# Patient Record
Sex: Male | Born: 1955
Health system: Southern US, Community
[De-identification: ages and names within clinical notes are randomized; demographics above are authoritative.]

## PROBLEM LIST (undated history)

## (undated) DIAGNOSIS — M545 Low back pain, unspecified: Secondary | ICD-10-CM

## (undated) DIAGNOSIS — J309 Allergic rhinitis, unspecified: Secondary | ICD-10-CM

## (undated) DIAGNOSIS — Z860100 Personal history of colon polyps, unspecified: Secondary | ICD-10-CM

## (undated) DIAGNOSIS — J4530 Mild persistent asthma, uncomplicated: Secondary | ICD-10-CM

## (undated) DIAGNOSIS — I10 Essential (primary) hypertension: Secondary | ICD-10-CM

## (undated) DIAGNOSIS — Z8701 Personal history of pneumonia (recurrent): Secondary | ICD-10-CM

## (undated) DIAGNOSIS — Z87442 Personal history of urinary calculi: Secondary | ICD-10-CM

## (undated) DIAGNOSIS — K5909 Other constipation: Secondary | ICD-10-CM

## (undated) DIAGNOSIS — Z9189 Other specified personal risk factors, not elsewhere classified: Secondary | ICD-10-CM

## (undated) DIAGNOSIS — Z973 Presence of spectacles and contact lenses: Secondary | ICD-10-CM

## (undated) DIAGNOSIS — C61 Malignant neoplasm of prostate: Secondary | ICD-10-CM

## (undated) DIAGNOSIS — Z8781 Personal history of (healed) traumatic fracture: Secondary | ICD-10-CM

## (undated) DIAGNOSIS — M8580 Other specified disorders of bone density and structure, unspecified site: Secondary | ICD-10-CM

## (undated) DIAGNOSIS — R0600 Dyspnea, unspecified: Secondary | ICD-10-CM

## (undated) DIAGNOSIS — C679 Malignant neoplasm of bladder, unspecified: Secondary | ICD-10-CM

## (undated) DIAGNOSIS — R200 Anesthesia of skin: Secondary | ICD-10-CM

## (undated) DIAGNOSIS — C7989 Secondary malignant neoplasm of other specified sites: Secondary | ICD-10-CM

## (undated) DIAGNOSIS — N401 Enlarged prostate with lower urinary tract symptoms: Secondary | ICD-10-CM

## (undated) DIAGNOSIS — Z8042 Family history of malignant neoplasm of prostate: Secondary | ICD-10-CM

## (undated) DIAGNOSIS — R0609 Other forms of dyspnea: Secondary | ICD-10-CM

## (undated) DIAGNOSIS — G8929 Other chronic pain: Secondary | ICD-10-CM

## (undated) HISTORY — DX: Personal history of colon polyps, unspecified: Z86.0100

## (undated) HISTORY — DX: Personal history of colonic polyps: Z86.010

## (undated) HISTORY — DX: Personal history of urinary calculi: Z87.442

## (undated) HISTORY — PX: UMBILICAL HERNIA REPAIR: SHX196

## (undated) HISTORY — PX: PROSTATE BIOPSY: SHX241

## (undated) HISTORY — DX: Essential (primary) hypertension: I10

## (undated) HISTORY — PX: COLONOSCOPY: SHX174

## (undated) HISTORY — DX: Family history of malignant neoplasm of prostate: Z80.42

---

## 2001-04-04 ENCOUNTER — Encounter (INDEPENDENT_AMBULATORY_CARE_PROVIDER_SITE_OTHER): Payer: Self-pay | Admitting: Specialist

## 2001-04-04 ENCOUNTER — Ambulatory Visit (HOSPITAL_COMMUNITY): Admission: RE | Admit: 2001-04-04 | Discharge: 2001-04-04 | Payer: Self-pay | Admitting: Gastroenterology

## 2001-09-02 ENCOUNTER — Ambulatory Visit (HOSPITAL_COMMUNITY): Admission: RE | Admit: 2001-09-02 | Discharge: 2001-09-02 | Payer: Self-pay | Admitting: *Deleted

## 2001-09-02 ENCOUNTER — Encounter: Payer: Self-pay | Admitting: *Deleted

## 2003-05-07 ENCOUNTER — Ambulatory Visit (HOSPITAL_COMMUNITY): Admission: RE | Admit: 2003-05-07 | Discharge: 2003-05-07 | Payer: Self-pay | Admitting: Gastroenterology

## 2007-07-28 LAB — HM COLONOSCOPY

## 2009-12-20 ENCOUNTER — Encounter: Payer: Self-pay | Admitting: Internal Medicine

## 2010-05-20 ENCOUNTER — Ambulatory Visit: Payer: Self-pay | Admitting: Internal Medicine

## 2010-05-20 DIAGNOSIS — J4520 Mild intermittent asthma, uncomplicated: Secondary | ICD-10-CM | POA: Insufficient documentation

## 2010-05-20 DIAGNOSIS — I10 Essential (primary) hypertension: Secondary | ICD-10-CM | POA: Insufficient documentation

## 2010-05-20 DIAGNOSIS — J309 Allergic rhinitis, unspecified: Secondary | ICD-10-CM | POA: Insufficient documentation

## 2010-07-27 HISTORY — PX: ROTATOR CUFF REPAIR: SHX139

## 2010-08-26 NOTE — Assessment & Plan Note (Signed)
Summary: Pulmonary consultation/ asthma > try change to Symbicort   Copy to:  Dr. Christell Constant Primary Bronwyn Belasco/Referring Corona Popovich:  Dr. Vernon Prey  CC:  Asthma.  History of Present Illness: 55 yowm quit  smoking 2001 with dx of asthma never totally outgrew.  May 20, 2010  1st pulmonary office eval  maintained on advair cc subjective wheeze sob maybe once a week  ventolin,  things that trigger cold weath, really humid weather, rare flares with viral illness or colds.  Always a  bit sob with ex.   Needing prednsione maybe once a year even if doubles the dose of advair right at onset. Usually requires 10 days of high dose rx to control it.  no noct flares.  Pt denies any significant sore throat, dysphagia, itching, sneezing,  nasal congestion or excess secretions,  fever, chills, sweats, unintended wt loss, pleuritic or exertional cp, hempoptysis, change in activity tolerance  orthopnea pnd or leg swelling Pt also denies any obvious fluctuation in symptoms with weather or environmental change or other alleviating or aggravating factors.       Current Medications (verified): 1)  Diovan Hct 160-12.5 Mg Tabs (Valsartan-Hydrochlorothiazide) .[name].. 1 Once Daily 2)  Norvasc 10 Mg Tabs (Amlodipine Besylate) .[name].. 1 Once Daily 3)  Advair Diskus 100-50 Mcg/dose Aepb (Fluticasone-Salmeterol) .[name].. 1 Puff Two Times A Day 4)  Nasonex 50 Mcg/act Susp (Mometasone Furoate) .[name].. 1 Spray Each Nostril Daily 5)  Astelin 137 Mcg/spray Soln (Azelastine Hcl) .... One Spray Each Nostril Daily 6)  Ventolin Hfa 108 (90 Base) Mcg/act Aers (Albuterol Sulfate) .... As Needed  Allergies (verified): 1)  ! Tetracycline  Past History:  Past Medical History: Allergic Rhinitis Asthma   - HFA 75% p coaching May 20, 2010  Hypertension  Past Surgical History: rotator cuff-Jan 16, 2010 Family History: Father-died age 57 of MI, prostate cancer of MI, prostate cancer Negative for respiratory diseases or atopy   Social History: Pt started  smoking in 1971, quit in 2001, less than 1ppd.  Married Production designer, theatre/television/film  Vital Signs:  Patient profile:   55 year old male Height:      70 inches Weight:      201 pounds BMI:     28.94 O2 Sat:      98 % on Room air Temp:     98.0 degrees F oral Pulse rate:   98 / minute Cuff size:   regular  Vitals Entered By: Carron Curie CMA (May 20, 2010 10:00 AM)  O2 Flow:  Room air CC: Asthma Comments Medications reviewed with patient Carron Curie CMA  May 20, 2010 10:04 AM Daytime phone number verified with patient.    Physical Exam  Additional Exam:  Pleasant amb wm nad. HEENT: nl dentition, turbinates, and orophanx. Nl external ear canals without cough reflex NECK :  without JVD/Nodes/TM/ nl carotid upstrokes bilaterally LUNGS: no acc muscle use, clear to A and P bilaterally without cough on insp or exp maneuvers CV:  RRR  no s3 or murmur or increase in P2, no edema  ABD:  soft and nontender with nl excursion in the supine position. No bruits or organomegaly, bowel sounds nl MS:  warm without deformities, calf tenderness, cyanosis or clubbing SKIN: warm and dry without lesions   NEURO:  alert, approp, no deficits     CXR  Procedure date:  12/20/2009  Findings:      No active cardiopulmonary disease  Impression & Recommendations:  Problem # 1:  ASTHMA (ICD-493.90)  No evidence of sign copd despite  smoking hx,  not too bad in terms of daily symptom control but flares are quite severe and avg one round of prednisone per year    DDX of  difficult airways managment all start with A and  include Adherence, Ace Inhibitors, Acid Reflux, Active Sinus Disease, Alpha 1 Antitripsin deficiency, Anxiety masquerading as Airways dz,  ABPA,  allergy(esp in young), Aspiration (esp in elderly), Adverse effects of DPI,  Active smokers, plus one B  = Beta blocker use.. and one C =CHF  Adherence:  not great with advair which is not very forgiving in terms of slow onset when needed  for flare . I spent extra time with the patient today explaining optimal mdi  technique.  This improved from  50-75% p coaching  ? adverse effect from dpi:  try hfa   I discussed the importance of understanding the goals of asthma therapy including the rule of 2's as it applies to the use of a rescue inhaler.   Problem # 2:  ALLERGIC RHINITIS (ICD-477.9)  His updated medication list for this problem includes:    Nasonex 50 Mcg/act Susp (Mometasone furoate) .[name]... 1 spray each nostril daily    Astelin 137 Mcg/spray Soln (Azelastine hcl) ..... One spray each nostril daily  Reviewed rx  Orders: Consultation Level V (16109)  Medications Added to Medication List This Visit: 1)  Nasonex 50 Mcg/act Susp (Mometasone furoate) .[name].. 1 spray each nostril daily 2)  Astelin 137 Mcg/spray Soln (Azelastine hcl) .... One spray each nostril daily 3)  Ventolin Hfa 108 (90 Base) Mcg/act Aers (Albuterol sulfate) .... As needed 4)  Ventolin Hfa 108 (90 Base) Mcg/act Aers (Albuterol sulfate) .... 2 puffs every 4 hours if needed 5)  Symbicort 80-4.5 Mcg/act Aero (Budesonide-formoterol fumarate) .... 2 puffs first thing  in am and 2 puffs again in pm about 12 hours later  Patient Instructions: 1)  Work on perfecting  inhaler technique:  relax and blow all the way out then take a nice smooth deep breath back in, triggering the inhaler at same time you start breathing in  2)  try off advair 3)  try Symbicort 160 2 puffs first thing  in am and 2 puffs again in pm about 12 hours later and if you like it fill the prescription for the 80 and continue it 4)  If your breathing worsens or you need to use your rescue inhaler more than twice weekly or wake up more than twice a month with any respiratory symptoms or require more than two rescue inhalers per year, we need to see you right away. 5)  Return in one year if doing well. Prescriptions: ASTELIN 137 MCG/SPRAY SOLN (AZELASTINE HCL) one spray each nostril daily  #1  x 11   Entered and Authorized by:   Nyoka Cowden MD   Signed by:   Nyoka Cowden MD on 05/20/2010   Method used:   Electronically to        [facility]* (retail)       [address]       [place], [place]  [address]       Ph: [phone]       Fax:    RxID:   [id number] VENTOLIN HFA 108 (90 BASE) MCG/ACT AERS (ALBUTEROL SULFATE) 2 puffs every 4 hours if needed  #1 x 11   Entered and Authorized by:   Nyoka Cowden MD   Signed by:   Nyoka Cowden MD on 05/20/2010  Method used:   Electronically to        [facility]* (retail)       [address]       [place], [place]  [address]       Ph: [phone]       Fax:    RxID:   [id number] NASONEX 50 MCG/ACT SUSP (MOMETASONE FUROATE) 1 spray each nostril daily  #1 x 11   Entered and Authorized by:   Nyoka Cowden MD   Signed by:   Nyoka Cowden MD on 05/20/2010   Method used:   Electronically to        [facility]* (retail)       [address]       [place], [place]  [address]       Ph: [phone]       Fax:    RxID:   [id number] SYMBICORT 80-4.5 MCG/ACT  AERO (BUDESONIDE-FORMOTEROL FUMARATE) 2 puffs first thing  in am and 2 puffs again in pm about 12 hours later  #1 x 11   Entered and Authorized by:   Nyoka Cowden MD   Signed by:   Nyoka Cowden MD on 05/20/2010   Method used:   Electronically to        [facility]* (retail)       [address]       [place], [place]  [address]       Ph: [phone]       Fax:    RxID:   [id number]

## 2010-12-12 NOTE — Op Note (Signed)
   NAME:  [name] Holder, [name] Holder                   ACCOUNT NO.:  [id number]   MEDICAL RECORD NO.:  [id number]                   PATIENT TYPE:  AMB   LOCATION:  ENDO                                 FACILITY:  [facility]   PHYSICIAN:  James L. Malon Kindle., M.D.          DATE OF BIRTH:  1956/02/02   DATE OF PROCEDURE:  05/07/2003  DATE OF DISCHARGE:                                 OPERATIVE REPORT   PROCEDURE:  Colonoscopy.   MEDICATIONS:  Fentanyl 175 micrograms, Versed 12 mg IV.   INDICATION:  Previous polyps removed on colonoscopy with a strong family  history of colonic neoplasia.   DESCRIPTION OF PROCEDURE:  The procedure had been explained to the patient  and consent obtained.  With the patient in the left lateral decubitus  position, the Olympus pediatric-adjustable scope was inserted and advanced  to the cecum without difficulty. The ileocecal valve and appendical orifice  were seen.  The scope was withdrawn.  The cecum, ascending colon, transverse  colon, descending and sigmoid colon were seen well, and no polyps were seen  throughout.  The scope was withdrawn.  The patient tolerated the procedure  well.   ASSESSMENT:  No evidence of further colon polyps.   PLAN:  Will recommend repeating procedure in five years with yearly  Hemoccults.                                               James L. Malon Kindle., M.D.    Waldron Session  D:  05/07/2003  T:  05/07/2003  Job:  [id number]   cc:   Caryl Comes. Slotnick, M.D.  [address] [place]  [place] [address]  Fax: [phone]

## 2010-12-12 NOTE — Procedures (Signed)
[facility]  Patient:    [name] Holder, [name] Holder Visit Number: [id number] MRN: [id number]          Service Type: END Location: ENDO Attending Physician:  Orland Mustard Proc. Date: 04/04/01 Admit Date:  04/04/2001   CC:         Asa Saunas, M.D. [facility]   Procedure Report  PROCEDURE:  Colonoscopy with polypectomy.  MEDICATIONS:  Fentanyl 125 mcg, Versed 12 mg IV.  INDICATIONS:  Strong family history of colon polyps.  DESCRIPTION OF PROCEDURE:  The procedure had been explained to the patient and consent obtained.  With the patient in the left lateral decubitus position, the Olympus adult video colonoscope was inserted and advanced under direct visualization.  The prep was excellent.  We reached the cecum.  In the cecum, a 0.5 cm polyp was removed with the snare and sucked through the scope. The scope was withdrawn.  The cecum, ascending colon, hepatic flexure, transverse colon, splenic flexure, descending, and sigmoid colon were seen well and were unremarkable.  There was a 1 cm polyp in the sigmoid that was removed.  The scope was withdrawn.  The patient tolerated the procedure well.  ASSESSMENT:  Polyps in the cecum and ascending colon, removed.  PLAN:  Routine postpolypectomy instructions.  Will recommend repeating in two years. Attending Physician:  Orland Mustard DD:  04/04/01 TD:  04/04/01 Job: [id number]

## 2010-12-12 NOTE — Op Note (Signed)
[facility]  Patient:    [name] Holder, [name] Holder Visit Number: [id number] MRN: [id number]          Service Type: DSU Location: DAY Attending Physician:  Kandis Mannan Dictated by:   Donnie Coffin Samuella Cota, M.D. Proc. Date: 09/02/01 Admit Date:  09/02/2001   CC:         Gae Bon, M.D., [facility], [place], [place]   Operative Report  [id number]  PREOPERATIVE DIAGNOSIS:  Umbilical hernia.  POSTOPERATIVE DIAGNOSIS:  Umbilical hernia.  OPERATION:  Repair of umbilical hernia.  SURGEON:  Maisie Fus B. Samuella Cota, M.D.  ANESTHESIA:  General.  ANESTHESIOLOGIST:  CRNA.  DESCRIPTION OF PROCEDURE:  The patient was taken to the operating room and placed on the table in a supine position.  After satisfactory general anesthetic with intubation, the abdomen was prepped and draped in a sterile field.  A small vertical supraumbilical incision was made through the skin and subcutaneous tissue.  The skin of the umbilicus was freed up, and the hernia sac was dissected free.  Dissection was carried out superiorly to the umbilical hernia defect to be sure there was not an epigastric hernia.  There was no epigastric hernia.  The hernia sac was not opened but was just reduced into the abdomen.  The edges of the fascia were then approximated transversely using interrupted simple inverted sutures of 0 Novofil.  After this had been done, figure-of-eight sutures of 0 Vicryl were also placed transversely.  This seemed to give good closure.  The skin of the umbilicus was sutured to the deep fascia.  Subcutaneous tissue approximated with interrupted sutures of 3-0 Vicryl, and the skin was closed with single interrupted inverted subcuticular sutures of 4-0 Vicryl.  Benzoin and half-inch Steri-Strips were used to reinforce the skin closure.  A pressure dressing using 4 x 4s, ABD, and four inch Hypafix was applied.  The patient seemed to tolerate the procedure  well and was taken to the PACU in satisfactory condition. Dictated by:   Donnie Coffin Samuella Cota, M.D. Attending Physician:  Kandis Mannan DD:  09/02/01 TD:  09/03/01 Job: [id number]

## 2011-07-10 ENCOUNTER — Telehealth: Payer: Self-pay | Admitting: Internal Medicine

## 2011-07-10 MED ORDER — BUDESONIDE-FORMOTEROL FUMARATE 80-4.5 MCG/ACT IN AERO: 2.0000 | INHALATION_SPRAY | Freq: Two times a day (BID) | RESPIRATORY_TRACT | Status: DC

## 2011-07-10 NOTE — Telephone Encounter (Signed)
Pt has appt on 12-21 with MW---refill for the symbicort 80 has been sent to the pharamcy and pt is aware that he must keep this appt.  Pt stated that he has been waiting on this appt and will be here that day.

## 2011-07-17 ENCOUNTER — Ambulatory Visit (INDEPENDENT_AMBULATORY_CARE_PROVIDER_SITE_OTHER): Payer: 59 | Admitting: Internal Medicine

## 2011-07-17 ENCOUNTER — Ambulatory Visit (INDEPENDENT_AMBULATORY_CARE_PROVIDER_SITE_OTHER)
Admission: RE | Admit: 2011-07-17 | Discharge: 2011-07-17 | Disposition: A | Discharge status: Home / Self Care | Payer: 59 | Source: Ambulatory Visit | Attending: Internal Medicine | Admitting: Internal Medicine

## 2011-07-17 ENCOUNTER — Encounter: Payer: Self-pay | Admitting: Internal Medicine

## 2011-07-17 ENCOUNTER — Telehealth: Payer: Self-pay | Admitting: Internal Medicine

## 2011-07-17 VITALS — BP 140/80 | HR 86 | Temp 98.6°F | Ht 70.0 in | Wt 199.8 lb

## 2011-07-17 DIAGNOSIS — J45909 Unspecified asthma, uncomplicated: Secondary | ICD-10-CM

## 2011-07-17 MED ORDER — ALBUTEROL SULFATE HFA 108 (90 BASE) MCG/ACT IN AERS: 2.0000 | INHALATION_SPRAY | Freq: Four times a day (QID) | RESPIRATORY_TRACT | Status: DC

## 2011-07-17 MED ORDER — BUDESONIDE-FORMOTEROL FUMARATE 160-4.5 MCG/ACT IN AERO: INHALATION_SPRAY | RESPIRATORY_TRACT | Status: DC

## 2011-07-17 NOTE — Progress Notes (Signed)
Subjective:     Patient ID: [name] Holder, male   DOB: 04-Dec-1955, 55 y.o.   MRN: [id number]  HPI  55 yowm quit smoking 2001 with dx of asthma never totally outgrew.   May 20, 2010 1st pulmonary office eval maintained on advair cc subjective wheeze sob maybe once a week ventolin, things that trigger cold weath, really humid weather, rare flares with viral illness or colds. Always a bit sob with ex. Needing prednsione maybe once a year even if doubles the dose of advair right at onset. Usually requires 10 days of high dose rx to control it. no noct flares.  rec Change to symbicort 80 bid    07/18/2011 f/u ov/Kaylina Cahue presents for med refills. c/o having bronchitis x 1 month. Also, sob, wheezing, cough, and sinus congestion. Not consistent with symbicort , better p rx with prednsione, no purulent sputum.  Freq daytime saba but Sleeping ok without nocturnal  or early am exacerbation  of respiratory  c/o's or need for noct saba. Also denies any obvious fluctuation of symptoms with weather or environmental changes or other aggravating or alleviating factors except as outlined above   ROS  At present neg for  any significant sore throat, dysphagia, itching, sneezing,  nasal congestion or excess/ purulent secretions,  fever, chills, sweats, unintended wt loss, pleuritic or exertional cp, hempoptysis, orthopnea pnd or leg swelling.  Also denies presyncope, palpitations, heartburn, abdominal pain, nausea, vomiting, diarrhea  or change in bowel or urinary habits, dysuria,hematuria,  rash, arthralgias, visual complaints, headache, numbness weakness or ataxia.      Past Medical History:  Allergic Rhinitis  Asthma  - HFA 75% p coaching May 20, 2010  Hypertension    Review of Systems     Objective:   Physical Exam Additional Exam: Pleasant amb wm nad.  HEENT: nl dentition, turbinates, and orophanx. Nl external ear canals without cough reflex  NECK : without JVD/Nodes/TM/ nl carotid  upstrokes bilaterally  LUNGS: no acc muscle use, clear to A and P bilaterally without cough on insp or exp maneuvers  CV: RRR no s3 or murmur or increase in P2, no edema  ABD: soft and nontender with nl excursion in the supine position. No bruits or organomegaly, bowel sounds nl  MS: warm without deformities, calf tenderness, cyanosis or clubbing  SKIN: warm and dry without lesions  NEURO: alert, approp, no deficits       CXR  07/17/2011 :  Emphysematous and bronchitic changes. No acute abnormalities.   Assessment:         Plan:

## 2011-07-17 NOTE — Patient Instructions (Signed)
Work on inhaler technique:  relax and gently blow all the way out then take a nice smooth deep breath back in, triggering the inhaler at same time you start breathing in.  Hold for up to 5 seconds if you can.  Rinse and gargle with water when done   If your mouth or throat starts to bother you,   I suggest you time the inhaler to your dental care and after using the inhaler(s) brush teeth and tongue with a baking soda containing toothpaste and when you rinse this out, gargle with it first to see if this helps your mouth and throat.     Ok to use the ventolin if wait 15 minutes after symbicort to let it work but the goal is less than 2 puffs of ventolin twice weekly  Try prilosec 20mg   Take 30-60 min before first meal of the day and Pepcid 20 mg one bedtime until cough is completely gone for at least a week without the need for cough suppression  I think of reflux for chronic cough like I do oxygen for fire (doesn't cause the fire but once you get the oxygen suppressed it usually goes away regardless of the exact cause).   GERD (REFLUX)  is an extremely common cause of respiratory symptoms, many times with no significant heartburn at all.    It can be treated with medication, but also with lifestyle changes including avoidance of late meals, excessive alcohol, smoking cessation, and avoid fatty foods, chocolate, peppermint, colas, red wine, and acidic juices such as orange juice.  NO MINT OR MENTHOL PRODUCTS SO NO COUGH DROPS  USE SUGARLESS CANDY INSTEAD (jolley ranchers or Stover's)  NO OIL BASED VITAMINS - use powdered substitutes.  Please remember to go to  x-ray department downstairs for your tests - we will call you with the results when they are available.

## 2011-07-18 ENCOUNTER — Encounter: Payer: Self-pay | Admitting: Internal Medicine

## 2011-07-18 NOTE — Assessment & Plan Note (Signed)
DDX of  difficult airways managment all start with A and  include Adherence, Ace Inhibitors, Acid Reflux, Active Sinus Disease, Alpha 1 Antitripsin deficiency, Anxiety masquerading as Airways dz,  ABPA,  allergy(esp in young), Aspiration (esp in elderly), Adverse effects of DPI,  Active smokers, plus two Bs  = Bronchiectasis and Beta blocker use..and one C= CHF  In this case Adherence is the biggest issue and starts with  inability to use HFA effectively and also  understand that SABA treats the symptoms but doesn't get to the underlying problem (inflammation).  I used  the analogy of putting steroid cream on a rash to help explain the meaning of topical therapy and the need to get the drug to the target tissue.  The proper method of use, as well as anticipated side effects, of this metered-dose inhaler are discussed and demonstrated to the patient. Improved to 75% with coaching so try symicort 160 2 bid then needs pfts if not 100% back to baseline  ? Acid reflux > see diet/ short term acid suppression empiriically.

## 2011-07-20 ENCOUNTER — Other Ambulatory Visit: Payer: Self-pay | Admitting: Internal Medicine

## 2011-07-20 MED ORDER — ALBUTEROL SULFATE HFA 108 (90 BASE) MCG/ACT IN AERS: 2.0000 | INHALATION_SPRAY | Freq: Four times a day (QID) | RESPIRATORY_TRACT | Status: DC

## 2011-07-20 NOTE — Progress Notes (Signed)
Quick Note:  Spoke with pt and notified of results per Dr. Wert. Pt verbalized understanding and denied any questions.  ______ 

## 2012-08-11 ENCOUNTER — Ambulatory Visit: Payer: 59 | Admitting: Internal Medicine

## 2012-08-18 ENCOUNTER — Ambulatory Visit (INDEPENDENT_AMBULATORY_CARE_PROVIDER_SITE_OTHER): Payer: 59 | Admitting: Internal Medicine

## 2012-08-18 ENCOUNTER — Encounter: Payer: Self-pay | Admitting: Internal Medicine

## 2012-08-18 VITALS — BP 120/72 | HR 89 | Temp 98.4°F | Ht 69.0 in | Wt 197.2 lb

## 2012-08-18 DIAGNOSIS — J45909 Unspecified asthma, uncomplicated: Secondary | ICD-10-CM

## 2012-08-18 MED ORDER — ALBUTEROL SULFATE HFA 108 (90 BASE) MCG/ACT IN AERS: 2.0000 | INHALATION_SPRAY | Freq: Four times a day (QID) | RESPIRATORY_TRACT | Status: DC | PRN

## 2012-08-18 MED ORDER — BUDESONIDE-FORMOTEROL FUMARATE 160-4.5 MCG/ACT IN AERO: INHALATION_SPRAY | RESPIRATORY_TRACT | Status: DC

## 2012-08-18 NOTE — Progress Notes (Signed)
Subjective:     Patient ID: [name] Holder, male   DOB: Dec 22, 1955    MRN: [id number]  HPI  38 yowm quit smoking 2001 with dx of asthma as child  never totally outgrew.   May 20, 2010 1st pulmonary office eval maintained on advair cc subjective wheeze sob maybe once a week ventolin, things that trigger cold weath, really humid weather, rare flares with viral illness or colds. Always a bit sob with ex. Needing prednsione maybe once a year even if doubles the dose of advair right at onset. Usually requires 10 days of high dose rx to control it. no noct flares.  rec Change to symbicort 80 bid    07/18/2011 f/u ov/Flonnie Wierman presents for med refills. c/o having bronchitis x 1 month. Also, sob, wheezing, cough, and sinus congestion rec Work on inhaler technique:  relax and gently blow all the way out then take a nice smooth deep breath back in, triggering the inhaler at same time you start breathing in.  Hold for up to 5 seconds if you can.  Rinse and gargle with water when done   If your mouth or throat starts to bother you,   I suggest you time the inhaler to your dental care and after using the inhaler(s) brush teeth and tongue with a baking soda containing toothpaste and when you rinse this out, gargle with it first to see if this helps your mouth and throat.     Ok to use the ventolin if wait 15 minutes after symbicort to let it work but the goal is less than 2 puffs of ventolin twice weekly  Try prilosec 20mg   Take 30-60 min before first meal of the day and Pepcid 20 mg one bedtime until cough is completely gone for at least a week without the need for cough suppression  I think of reflux for chronic cough like I do oxygen for fire (doesn't cause the fire but once you get the oxygen suppressed it usually goes away regardless of the exact cause).   GERD   08/18/2012 f/u ov/Kiyan Burmester cc doe with hot humid weather otherwise doing ok on avg hfa saba once daily Much worse when not on  symbicort 160 2bid.  Admits Not consistent with symbicort , better p rx with prednsione, no purulent sputum.    Sleeping ok without nocturnal  or early am exacerbation  of respiratory  c/o's or need for noct saba. Also denies any obvious fluctuation of symptoms with weather or environmental changes or other aggravating or alleviating factors except as outlined above   No obvious daytime variabilty or assoc chronic cough or cp or chest tightness, subjective wheeze overt sinus or hb symptoms. No unusual exp hx or h/o childhood pna/ asthma or premature birth to his knowledge.   ROS  The following are not active complaints unless bolded sore throat, dysphagia, dental problems, itching, sneezing,  nasal congestion or excess/ purulent secretions, ear ache,   fever, chills, sweats, unintended wt loss, pleuritic or exertional cp, hemoptysis,  orthopnea pnd or leg swelling, presyncope, palpitations, heartburn, abdominal pain, anorexia, nausea, vomiting, diarrhea  or change in bowel or urinary habits, change in stools or urine, dysuria,hematuria,  rash, arthralgias, visual complaints, headache, numbness weakness or ataxia or problems with walking or coordination,  change in mood/affect or memory.          Past Medical History:  Allergic Rhinitis  Asthma  - HFA 75% p coaching May 20, 2010  Hypertension  Objective:   Physical Exam Wt Readings from Last 3 Encounters:  08/18/12 197 lb 3.2 oz (89.449 kg)  07/17/11 199 lb 12.8 oz (90.629 kg)  05/20/10 201 lb (91.173 kg)    Additional Exam: Pleasant amb wm nad.  HEENT: nl dentition, turbinates, and orophanx. Nl external ear canals without cough reflex  NECK : without JVD/Nodes/TM/ nl carotid upstrokes bilaterally  LUNGS: no acc muscle use, clear to A and P bilaterally without cough on insp or exp maneuvers  CV: RRR no s3 or murmur or increase in P2, no edema  ABD: soft and nontender with nl excursion in the supine position. No bruits or  organomegaly, bowel sounds nl  MS: warm without deformities, calf tenderness, cyanosis or clubbing  SKIN: warm and dry without lesions  NEURO: alert, approp, no deficits       CXR  07/17/2011 :  Emphysematous and bronchitic changes. No acute abnormalities.   Assessment:         Plan:

## 2012-08-18 NOTE — Patient Instructions (Addendum)
Flare of bronchitis > Try prilosec 20mg   Take 30-60 min before first meal of the day and Pepcid 20 mg one bedtime until cough is completely gone for at least a week without the need for cough suppression  I think of reflux for chronic cough like I do oxygen for fire (doesn't cause the fire but once you get the oxygen suppressed it usually goes away regardless of the exact cause).   Please schedule a follow up visit in 6 months but call sooner if needed with pft's and cxr

## 2012-08-21 NOTE — Assessment & Plan Note (Addendum)
Better but still not at goal in terms of saba use  DDX of  difficult airways managment all start with A and  include Adherence, Ace Inhibitors, Acid Reflux, Active Sinus Disease, Alpha 1 Antitripsin deficiency, Anxiety masquerading as Airways dz,  ABPA,  allergy(esp in young), Aspiration (esp in elderly), Adverse effects of DPI,  Active smokers, plus two Bs  = Bronchiectasis and Beta blocker use..and one C= CHF  Adherence is always the initial "prime suspect" and is a multilayered concern that requires a "trust but verify" approach in every patient - starting with knowing how to use medications, especially inhalers, correctly, keeping up with refills and understanding the fundamental difference between maintenance and prns vs those medications only taken for a very short course and then stopped and not refilled.   The proper method of use, as well as anticipated side effects, of a metered-dose inhaler are discussed and demonstrated to the patient. Improved effectiveness after extensive coaching during this visit to a level of approximately  100%  ? Acid reflux > add max acid suppression for next flare if already on symbiocrt 160 2bid  Needs pft's and cxr next ov

## 2012-08-26 ENCOUNTER — Other Ambulatory Visit: Payer: Self-pay | Admitting: Internal Medicine

## 2012-08-26 ENCOUNTER — Other Ambulatory Visit (INDEPENDENT_AMBULATORY_CARE_PROVIDER_SITE_OTHER): Payer: 59

## 2012-08-26 ENCOUNTER — Ambulatory Visit (INDEPENDENT_AMBULATORY_CARE_PROVIDER_SITE_OTHER): Payer: 59 | Admitting: Internal Medicine

## 2012-08-26 ENCOUNTER — Encounter: Payer: Self-pay | Admitting: Internal Medicine

## 2012-08-26 VITALS — BP 128/82 | HR 93 | Temp 98.7°F | Ht 69.0 in | Wt 193.8 lb

## 2012-08-26 DIAGNOSIS — Z13220 Encounter for screening for lipoid disorders: Secondary | ICD-10-CM

## 2012-08-26 DIAGNOSIS — Z130 Encounter for screening for diseases of the blood and blood-forming organs and certain disorders involving the immune mechanism: Secondary | ICD-10-CM

## 2012-08-26 DIAGNOSIS — R972 Elevated prostate specific antigen [PSA]: Secondary | ICD-10-CM

## 2012-08-26 DIAGNOSIS — Z131 Encounter for screening for diabetes mellitus: Secondary | ICD-10-CM

## 2012-08-26 DIAGNOSIS — Z23 Encounter for immunization: Secondary | ICD-10-CM

## 2012-08-26 DIAGNOSIS — Z0000 Encounter for general adult medical examination without abnormal findings: Secondary | ICD-10-CM

## 2012-08-26 DIAGNOSIS — I10 Essential (primary) hypertension: Secondary | ICD-10-CM

## 2012-08-26 DIAGNOSIS — Z125 Encounter for screening for malignant neoplasm of prostate: Secondary | ICD-10-CM

## 2012-08-26 DIAGNOSIS — Z9109 Other allergy status, other than to drugs and biological substances: Secondary | ICD-10-CM

## 2012-08-26 DIAGNOSIS — J209 Acute bronchitis, unspecified: Secondary | ICD-10-CM

## 2012-08-26 LAB — BASIC METABOLIC PANEL
BUN: 12 mg/dL (ref 6–23)
CO2: 26 mEq/L (ref 19–32)
Calcium: 9.3 mg/dL (ref 8.4–10.5)
Chloride: 99 mEq/L (ref 96–112)
Creatinine, Ser: 0.9 mg/dL (ref 0.4–1.5)
GFR: 94.34 mL/min (ref >60.00)
Glucose, Bld: 104 mg/dL — ABNORMAL HIGH (ref 70–99)
Potassium: 4 mEq/L (ref 3.5–5.1)
Sodium: 133 mEq/L — ABNORMAL LOW (ref 135–145)

## 2012-08-26 LAB — CBC
HCT: 46.2 % (ref 39.0–52.0)
Hemoglobin: 16.1 g/dL (ref 13.0–17.0)
MCHC: 35 g/dL (ref 30.0–36.0)
MCV: 96.8 fl (ref 78.0–100.0)
Platelets: 253 10*3/uL (ref 150.0–400.0)
RBC: 4.77 Mil/uL (ref 4.22–5.81)
RDW: 12.6 % (ref 11.5–14.6)
WBC: 7.2 10*3/uL (ref 4.5–10.5)

## 2012-08-26 LAB — PSA: PSA: 20.21 ng/mL — ABNORMAL HIGH (ref 0.10–4.00)

## 2012-08-26 LAB — LIPID PANEL
Cholesterol: 185 mg/dL (ref 0–200)
HDL: 65.8 mg/dL (ref >39.00)
LDL Cholesterol: 106 mg/dL — ABNORMAL HIGH (ref 0–99)
Total CHOL/HDL Ratio: 3
Triglycerides: 68 mg/dL (ref 0.0–149.0)
VLDL: 13.6 mg/dL (ref 0.0–40.0)

## 2012-08-26 LAB — HEMOGLOBIN A1C: Hgb A1c MFr Bld: 5.5 % (ref 4.6–6.5)

## 2012-08-26 MED ORDER — MOMETASONE FUROATE 50 MCG/ACT NA SUSP: 2.0000 | Freq: Every day | NASAL | Status: DC

## 2012-08-26 MED ORDER — VALSARTAN-HYDROCHLOROTHIAZIDE 160-12.5 MG PO TABS: 1.0000 | ORAL_TABLET | Freq: Every day | ORAL | Status: DC

## 2012-08-26 MED ORDER — AZITHROMYCIN 250 MG PO TABS: ORAL_TABLET | ORAL | Status: DC

## 2012-08-26 MED ORDER — AZELASTINE HCL 0.1 % NA SOLN: 2.0000 | Freq: Two times a day (BID) | NASAL | Status: DC

## 2012-08-26 MED ORDER — AMLODIPINE BESYLATE 10 MG PO TABS: 10.0000 mg | ORAL_TABLET | Freq: Every day | ORAL | Status: DC

## 2012-08-26 NOTE — Assessment & Plan Note (Signed)
   Refilled Norvasc and Diovan today Continue to follow dash diet Continue to monitor blood pressure at home

## 2012-08-26 NOTE — Assessment & Plan Note (Signed)
   Refilled Nasonex and Astelin today Continue to avoid triggers

## 2012-08-26 NOTE — Progress Notes (Signed)
HPI  Pt presents to the clinic today to establish care. He is transferring from another PCP- whose name he would prefer not to mention. He has not had a complete physical with blood work in a number of years. He does need refills on his blood pressure medication. He does have some concerns today of a cough. This started 4 days ago. He has problems with allergies and asthma and usually gets bronchitis about this time each year. He states this feels the same. He is producing a small amount of thick green sputum. He does have headache and fatigue but denies fever, chills or body aches. He has had sick contacts.  Flu: 04/2012 Tetanus: greater than 10 years Colonoscopy: 2009 Eye doctor: yearly Dentist: yearly  Past Medical History  Diagnosis Date  . Asthma   . Allergy   . Hypertension   . Hx of colonic polyps   . History of kidney stones     Current Outpatient Prescriptions  Medication Sig Dispense Refill  . albuterol (PROVENTIL HFA;VENTOLIN HFA) 108 (90 BASE) MCG/ACT inhaler Inhale 2 puffs into the lungs every 6 (six) hours as needed for wheezing.  1 Inhaler  6  . amLODipine (NORVASC) 10 MG tablet Take 1 tablet (10 mg total) by mouth daily.  30 tablet  5  . azelastine (ASTELIN) 137 MCG/SPRAY nasal spray Place 2 sprays into the nose 2 (two) times daily. Use in each nostril as directed  30 mL  5  . budesonide-formoterol (SYMBICORT) 160-4.5 MCG/ACT inhaler Take 2 puffs first thing in am and then another 2 puffs about 12 hours later.  1 Inhaler  12  . mometasone (NASONEX) 50 MCG/ACT nasal spray Place 2 sprays into the nose daily. 1 spray in each nostril daily  17 g  5  . valsartan-hydrochlorothiazide (DIOVAN-HCT) 160-12.5 MG per tablet Take 1 tablet by mouth daily.  30 tablet  5  . azithromycin (ZITHROMAX) 250 MG tablet Take 2 tablets today, then 1 tablet daily for 4 days  6 tablet  0    Allergies  Allergen Reactions  . Tetracycline     Family History  Problem Relation Age of Onset  .  Arthritis Mother   . Diabetes Mother   . Hypertension Mother   . Hyperlipidemia Mother   . Hypertension Father   . Cancer Father     Prostate Cancer  . Stroke Maternal Grandfather     History   Social History  . Marital Status: Married    Spouse Name: N/A    Number of Children: N/A  . Years of Education: 16   Occupational History  . Quality Manager [employer]   Social History Main Topics  . Smoking status: Former Smoker -- 1.0 packs/day for 15 years    Types: Cigarettes    Quit date: 07/16/1996  . Smokeless tobacco: Never Used  . Alcohol Use: 1.2 oz/week    2 Glasses of wine per week  . Drug Use: No  . Sexually Active: Yes   Other Topics Concern  . Not on file   Social History Narrative   Regular exercise-yesCaffeine Use-yes    ROS:  Constitutional: Pt reports fatigue and headache. Denies fever, malaise, or abrupt weight changes.  HEENT: Denies eye pain, eye redness, ear pain, ringing in the ears, wax buildup, runny nose, nasal congestion, bloody nose, or sore throat. Respiratory: Pt reports cough. Denies difficulty breathing, shortness of breath, or sputum production.   Cardiovascular: Denies chest pain, chest tightness, palpitations or swelling  in the hands or feet.  Gastrointestinal: Denies abdominal pain, bloating, constipation, diarrhea or blood in the stool.  GU: Denies frequency, urgency, pain with urination, blood in urine, odor or discharge. Musculoskeletal: Denies decrease in range of motion, difficulty with gait, muscle pain or joint pain and swelling.  Skin: Denies redness, rashes, lesions or ulcercations.  Neurological: Denies dizziness, difficulty with memory, difficulty with speech or problems with balance and coordination.   No other specific complaints in a complete review of systems (except as listed in HPI above).  PE:  BP 128/82  Pulse 93  Temp 98.7 F (37.1 C) (Oral)  Ht 5\' 9"  (1.753 m)  Wt 193 lb 12.8 oz (87.907 kg)  BMI 28.62  kg/m2  SpO2 98% Wt Readings from Last 3 Encounters:  08/26/12 193 lb 12.8 oz (87.907 kg)  08/18/12 197 lb 3.2 oz (89.449 kg)  07/17/11 199 lb 12.8 oz (90.629 kg)    General: Appears his stated age, well developed, well nourished in NAD. HEENT: Head: normal shape and size; Eyes: sclera white, no icterus, conjunctiva pink, PERRLA and EOMs intact; Ears: Tm's gray and intact, normal light reflex; Nose: mucosa pink and moist, septum midline; Throat/Mouth: Teeth present, mucosa pink and moist, no lesions or ulcerations noted.  Neck: Normal range of motion. Neck supple, trachea midline. No massses, lumps or thyromegaly present.  Cardiovascular: Normal rate and rhythm. S1,S2 noted.  No murmur, rubs or gallops noted. No JVD or BLE edema. No carotid bruits noted. Pulmonary/Chest: Normal effort and scattered ronchi throughout. No respiratory distress. No wheezes, rales noted.  Abdomen: Soft and nontender. Normal bowel sounds, no bruits noted. No distention or masses noted. Liver, spleen and kidneys non palpable. Musculoskeletal: Normal range of motion. No signs of joint swelling. No difficulty with gait.  Neurological: Alert and oriented. Cranial nerves II-XII intact. Coordination normal. +DTRs bilaterally. Psychiatric: Mood and affect normal. Behavior is normal. Judgment and thought content normal.      Assessment and Plan:  Preventative Health Maintenance:  Continue diet and exercise Will give Tdap today Will obtain basic screening labs today   Acute Bronchitis, new onset with additional workup required:  Stay well hydrated and get plenty of rest eRx for Azithromax x 5 days Robitussin for cough  RTC in 6 months

## 2012-08-26 NOTE — Patient Instructions (Signed)
Health Maintenance, Males A healthy lifestyle and preventative care can promote health and wellness.  Maintain regular health, dental, and eye exams.  Eat a healthy diet. Foods like vegetables, fruits, whole grains, low-fat dairy products, and lean protein foods contain the nutrients you need without too many calories. Decrease your intake of foods high in solid fats, added sugars, and salt. Get information about a proper diet from your caregiver, if necessary.  Regular physical exercise is one of the most important things you can do for your health. Most adults should get at least 150 minutes of moderate-intensity exercise (any activity that increases your heart rate and causes you to sweat) each week. In addition, most adults need muscle-strengthening exercises on 2 or more days a week.   Maintain a healthy weight. The body mass index (BMI) is a screening tool to identify possible weight problems. It provides an estimate of body fat based on height and weight. Your caregiver can help determine your BMI, and can help you achieve or maintain a healthy weight. For adults 20 years and older:  A BMI below 18.5 is considered underweight.  A BMI of 18.5 to 24.9 is normal.  A BMI of 25 to 29.9 is considered overweight.  A BMI of 30 and above is considered obese.  Maintain normal blood lipids and cholesterol by exercising and minimizing your intake of saturated fat. Eat a balanced diet with plenty of fruits and vegetables. Blood tests for lipids and cholesterol should begin at age 20 and be repeated every 5 years. If your lipid or cholesterol levels are high, you are over 50, or you are a high risk for heart disease, you may need your cholesterol levels checked more frequently.Ongoing high lipid and cholesterol levels should be treated with medicines, if diet and exercise are not effective.  If you smoke, find out from your caregiver how to quit. If you do not use tobacco, do not start.  If you  choose to drink alcohol, do not exceed 2 drinks per day. One drink is considered to be 12 ounces (355 mL) of beer, 5 ounces (148 mL) of wine, or 1.5 ounces (44 mL) of liquor.  Avoid use of street drugs. Do not share needles with anyone. Ask for help if you need support or instructions about stopping the use of drugs.  High blood pressure causes heart disease and increases the risk of stroke. Blood pressure should be checked at least every 1 to 2 years. Ongoing high blood pressure should be treated with medicines if weight loss and exercise are not effective.  If you are 45 to 59 years old, ask your caregiver if you should take aspirin to prevent heart disease.  Diabetes screening involves taking a blood sample to check your fasting blood sugar level. This should be done once every 3 years, after age 45, if you are within normal weight and without risk factors for diabetes. Testing should be considered at a younger age or be carried out more frequently if you are overweight and have at least 1 risk factor for diabetes.  Colorectal cancer can be detected and often prevented. Most routine colorectal cancer screening begins at the age of 50 and continues through age 75. However, your caregiver may recommend screening at an earlier age if you have risk factors for colon cancer. On a yearly basis, your caregiver may provide home test kits to check for hidden blood in the stool. Use of a small camera at the end of a tube,   to directly examine the colon (sigmoidoscopy or colonoscopy), can detect the earliest forms of colorectal cancer. Talk to your caregiver about this at age 50, when routine screening begins. Direct examination of the colon should be repeated every 5 to 10 years through age 75, unless early forms of pre-cancerous polyps or small growths are found.  Hepatitis C blood testing is recommended for all people born from 1945 through 1965 and any individual with known risks for hepatitis C.  Healthy  men should no longer receive prostate-specific antigen (PSA) blood tests as part of routine cancer screening. Consult with your caregiver about prostate cancer screening.  Testicular cancer screening is not recommended for adolescents or adult males who have no symptoms. Screening includes self-exam, caregiver exam, and other screening tests. Consult with your caregiver about any symptoms you have or any concerns you have about testicular cancer.  Practice safe sex. Use condoms and avoid high-risk sexual practices to reduce the spread of sexually transmitted infections (STIs).  Use sunscreen with a sun protection factor (SPF) of 30 or greater. Apply sunscreen liberally and repeatedly throughout the day. You should seek shade when your shadow is shorter than you. Protect yourself by wearing long sleeves, pants, a wide-brimmed hat, and sunglasses year round, whenever you are outdoors.  Notify your caregiver of new moles or changes in moles, especially if there is a change in shape or color. Also notify your caregiver if a mole is larger than the size of a pencil eraser.  A one-time screening for abdominal aortic aneurysm (AAA) and surgical repair of large AAAs by sound wave imaging (ultrasonography) is recommended for ages 65 to 75 years who are current or former smokers.  Stay current with your immunizations. Document Released: 01/09/2008 Document Revised: 10/05/2011 Document Reviewed: 12/08/2010 Exit[employer] Patient Information 2013 [employer].  

## 2012-10-28 LAB — HM COLONOSCOPY

## 2012-11-03 ENCOUNTER — Other Ambulatory Visit (HOSPITAL_COMMUNITY): Payer: Self-pay | Admitting: Urology

## 2012-11-03 DIAGNOSIS — C61 Malignant neoplasm of prostate: Secondary | ICD-10-CM

## 2012-11-09 ENCOUNTER — Telehealth: Payer: Self-pay | Admitting: *Deleted

## 2012-11-09 NOTE — Telephone Encounter (Signed)
CALLED PATIENT TO ALTER [place] VISIT FOR 11-17-12 DUE TO DR. Dayton Scrape BEING IN THE OR, RESCHEDULED FOR 11-18-12 7:30 AM FOR 8:00 AM APPT. , LVM FOR A RETURN CALL

## 2012-11-10 ENCOUNTER — Encounter (HOSPITAL_COMMUNITY)
Admission: RE | Admit: 2012-11-10 | Discharge: 2012-11-10 | Disposition: A | Discharge status: Home / Self Care | Payer: 59 | Source: Ambulatory Visit | Attending: Urology | Admitting: Urology

## 2012-11-10 DIAGNOSIS — C61 Malignant neoplasm of prostate: Secondary | ICD-10-CM | POA: Insufficient documentation

## 2012-11-10 MED ORDER — TECHNETIUM TC 99M MEDRONATE IV KIT
25.0000 | PACK | Freq: Once | INTRAVENOUS | Status: AC | PRN
  Administered 2012-11-10: 25 via INTRAVENOUS

## 2012-11-17 ENCOUNTER — Encounter: Payer: Self-pay | Admitting: Oncology

## 2012-11-17 ENCOUNTER — Ambulatory Visit: Payer: 59 | Admitting: Radiation Oncology

## 2012-11-17 ENCOUNTER — Ambulatory Visit: Admission: RE | Admit: 2012-11-17 | Payer: 59 | Source: Ambulatory Visit

## 2012-11-17 DIAGNOSIS — Z8546 Personal history of malignant neoplasm of prostate: Secondary | ICD-10-CM | POA: Insufficient documentation

## 2012-11-17 NOTE — Progress Notes (Signed)
GU Location of Tumor / Histology: Prostate Cancer  If Prostate Cancer, Gleason Score is (11/12 positive biopsies for G 7, 2/12 positive for G 6) and PSA is (23.37), prostate volume 30.24 cc.  Patient presented with signs/symptoms of: elevated PSA  Biopsies of prostate f applicable) revealed: adenocarcinoma of the prostate   Past/Anticipated interventions by urology, if any: possible debunking surgery and EBRT  Past/Anticipated interventions by medical oncology, if any: none  Weight changes, if any: none  Bowel/Bladder complaints, if any:    Nausea/Vomiting, if any:   Pain issues, if any:    SAFETY ISSUES:  Prior radiation? no  Pacemaker/ICD?  NO  Possible current pregnancy? no  Is the patient on methotrexate?  NO  Current Complaints / other details:   Increased frequency voiding, up 1x night,

## 2012-11-18 ENCOUNTER — Encounter: Payer: Self-pay | Admitting: Radiation Oncology

## 2012-11-18 ENCOUNTER — Ambulatory Visit
Admission: RE | Admit: 2012-11-18 | Discharge: 2012-11-18 | Disposition: A | Discharge status: Home / Self Care | Payer: 59 | Source: Ambulatory Visit | Attending: Radiation Oncology | Admitting: Radiation Oncology

## 2012-11-18 VITALS — BP 139/73 | HR 93 | Temp 99.6°F | Resp 20 | Ht 69.0 in | Wt 194.9 lb

## 2012-11-18 DIAGNOSIS — C61 Malignant neoplasm of prostate: Secondary | ICD-10-CM

## 2012-11-18 DIAGNOSIS — J45909 Unspecified asthma, uncomplicated: Secondary | ICD-10-CM | POA: Insufficient documentation

## 2012-11-18 DIAGNOSIS — Z87442 Personal history of urinary calculi: Secondary | ICD-10-CM | POA: Insufficient documentation

## 2012-11-18 DIAGNOSIS — I10 Essential (primary) hypertension: Secondary | ICD-10-CM | POA: Insufficient documentation

## 2012-11-18 DIAGNOSIS — Z87891 Personal history of nicotine dependence: Secondary | ICD-10-CM | POA: Insufficient documentation

## 2012-11-18 NOTE — Progress Notes (Signed)
New Consult Prostate cancer Alert,oriented x3, Married, no children, farther prostate cancer had radiation, around age 57, deceased 14 MI, Only symptom more frequency of voiding, , here to discuss radiation vs surgery ,has appt with Dr. Laverle Patter to discuss surgery next week 7:57 AM

## 2012-11-18 NOTE — Progress Notes (Signed)
Please see the Nurse Progress Note in the MD Initial Consult Encounter for this patient. 

## 2012-11-18 NOTE — Progress Notes (Signed)
[facility] Radiation Oncology NEW PATIENT EVALUATION  Name: [name] Holder MRN: [id number]  Date:   11/18/2012           DOB: [birth year]  Status: outpatient   CC: [name] Heap, MD  [name] Holder, * Dr. Crecencio Mc   REFERRING PHYSICIAN: Jethro Bolus I, *   DIAGNOSIS: Clinical stage T2c high-risk adenocarcinoma prostate   HISTORY OF PRESENT ILLNESS:  [name] Holder is a 57 y.o. male who is seen to the courtesy Dr. Patsi Sears for discussion of treatment options in the management of his Stage T2c high-risk adenocarcinoma prostate. He seems recall that his PSA 4 years ago was approximately 3. He tells me he thought he was getting annual PSAs but did not get another PSA until he was seen by a new primary care nurse practitioner. He was found to have a PSA of 20.21 on 08/26/2012. He thought he was being screened because of his positive family history with his father who diagnosed with prostate cancer in his mid 16s. He was seen by Dr. Patsi Sears who repeated his PSA on 10/03/2012 and this was found to be further elevated at 23.37. He underwent ultrasound-guided biopsies on 10/21/2012. He was found to have extensive/high volume Gleason 7 (3+4) involving 95% of one core from the right lateral base, 95% of one core from right lateral mid gland, 90% of one core from the right mid gland, 90% of one core from left lateral base, 95% of one core from the left base, 90% of one core from the left lateral mid gland,, 90% of one core from the left mid gland, 90% of one core from the left lateral apex and 95% of one core from left apex. He Gleason 6 (3+3) involving 95% of one core from the right base and 9% of one core from the right apex. His gland volume was approximately 30.24 cc. He is doing well from a GU and GI standpoint. His I PSS score is 3. He is potent. His staging workup included a bone scan on April 17 which was without evidence for metastatic disease. His CT scan  of the abdomen and pelvis showed mild left common and external iliac lymphadenopathy with lymph nodes measuring up to 1.2 cm. Less than 1 cm lymph nodes were seen along the left  retroperitoneal region.    PREVIOUS RADIATION THERAPY: No   PAST MEDICAL HISTORY:  has a past medical history of Asthma; Allergy; Hypertension; colonic polyps; History of kidney stones; Prostate cancer; and Elevated PSA (10/03/2012).     PAST SURGICAL HISTORY:  Past Surgical History  Procedure Laterality Date  . Rotator cuff repair  2012  . Hernia repair  ?2008  . Prostate biopsy      11/12 positive biopsies  . Colonoscopy       FAMILY HISTORY: family history includes Arthritis in his mother; Cancer in his father and sister; Diabetes in his mother; Hyperlipidemia in his mother; Hypertension in his father and mother; and Stroke in his maternal grandfather. his father died of a heart attack at 58 was diagnosed with prostate cancer in his mid 36s. His mother is alive and well at 61.   SOCIAL HISTORY:  reports that he quit smoking about 16 years ago. His smoking use included Cigarettes. He has a 15 pack-year smoking history. He has never used smokeless tobacco. He reports that he drinks about 1.2 ounces of alcohol per week. He reports that he does not use illicit drugs. Married, no  children. He works as a Production designer, theatre/television/film at [employer].   ALLERGIES: Tetracycline   MEDICATIONS:  Current Outpatient Prescriptions  Medication Sig Dispense Refill  . amLODipine (NORVASC) 10 MG tablet Take 1 tablet (10 mg total) by mouth daily.  30 tablet  5  . azelastine (ASTELIN) 137 MCG/SPRAY nasal spray Place 2 sprays into the nose 2 (two) times daily. Use in each nostril as directed  30 mL  5  . budesonide-formoterol (SYMBICORT) 160-4.5 MCG/ACT inhaler Take 2 puffs first thing in am and then another 2 puffs about 12 hours later.  1 Inhaler  12  . ibuprofen (ADVIL,MOTRIN) 200 MG tablet Take 200 mg by mouth as needed for pain.      .  mometasone (NASONEX) 50 MCG/ACT nasal spray Place 2 sprays into the nose daily. 1 spray in each nostril daily  17 g  5  . valsartan-hydrochlorothiazide (DIOVAN-HCT) 160-12.5 MG per tablet Take 1 tablet by mouth daily.  30 tablet  5  . albuterol (PROVENTIL HFA;VENTOLIN HFA) 108 (90 BASE) MCG/ACT inhaler Inhale 2 puffs into the lungs every 6 (six) hours as needed for wheezing.  1 Inhaler  6   No current facility-administered medications for this encounter.     REVIEW OF SYSTEMS:  Pertinent items are noted in HPI.    PHYSICAL EXAM: Alert and oriented 57 year old white male appearing his stated age.  Filed Vitals:   11/18/12 0741  BP: 139/73  Pulse: 93  Temp: 99.6 F (37.6 C)  Resp: 20   Head and neck examination: Grossly unremarkable. Nodes: Without palpable cervical or supraclavicular lymphadenopathy. Chest: Lungs clear. Back: Without spinal or CVA tenderness. Heart: Regular rate rhythm. Abdomen: Without masses organomegaly. Genitalia: Unremarkable to inspection. Rectal: Somewhat suboptimal secondary to discomfort. There is clearly induration of both lobes the prostate. I'm unable to examine the base of the prostate and seminal vesicles secondary to patient discomfort. No obvious periprostatic tumor extension. Extremities: Without edema. Neurologic examination: Grossly nonfocal.       LABORATORY DATA:  Lab Results  Component Value Date   WBC 7.2 08/26/2012   HGB 16.1 08/26/2012   HCT 46.2 08/26/2012   MCV 96.8 08/26/2012   PLT 253.0 08/26/2012   Lab Results  Component Value Date   NA 133* 08/26/2012   K 4.0 08/26/2012   CL 99 08/26/2012   CO2 26 08/26/2012   No results found for this basename: ALT, AST, GGT, ALKPHOS, BILITOT   Laboratory data: PSA 23.37 from 10/03/2012. BUN 10, creatinine 0.8 from 10/31/2012    IMPRESSION: Stage T2c high-risk adenocarcinoma prostate. I explained to the patient and his wife that his prognosis is related to his stage, Gleason score, and PSA level.  His Gleason score of 7 is of intermediate favorability while his stage and PSA level are unfavorable. Other prognostic factors include disease volume and also PSA doubling time. His disease volume is quite high and this is also unfavorable. I am concerned about the possibility of lymphadenopathy. He has high-risk disease. We briefly discussed the possibility of obtaining a MRI scan of his prostate to give [place] better anatomic staging of the prostate, and perhaps lymph nodes as well.[name] He certainly is at high-risk for extracapsular tumor extension, seminal vesicle involvement, and also nodal involvement. In very general terms we discussed surgery with a good chance of needing postoperative radiation therapy. Radiation therapy options include 5 weeks of external beam followed by seed implantation or 8 weeks of external beam/IMRT, both with androgen deprivation therapy. From a  radiobiological perspective, I would favor 5 weeks of external beam followed by seed implantation as the preferred radiation therapy option over external beam/IMRT because of a higher biological effective dose which would be needed to clear his high volume disease. From a planning standpoint, I would also plan to irradiate his pelvic lymph nodes. I do favor a more of an  aggressive approach in younger patients with strong consideration of surgery which would also give [place] better pathologic staging,  knowing that he would probably require postoperative radiation therapy. He may be a candidate for protocol therapy following definitive surgery. We discussed the potential acute and late toxicities of  radiation therapy and also the side effects of androgen deprivation therapy. He'll see Dr. Laverle Patter within the next few weeks, and I defer to Dr. Laverle Patter as to whether not she would like to obtain a MRI scan for staging purposes.     PLAN: As discussed above.   I spent 60 minutes minutes face to face with the patient and more than 50% of that time was  spent in counseling and/or coordination of care.

## 2012-12-01 ENCOUNTER — Encounter: Payer: Self-pay | Admitting: Internal Medicine

## 2012-12-06 ENCOUNTER — Other Ambulatory Visit (HOSPITAL_COMMUNITY): Payer: Self-pay | Admitting: Urology

## 2012-12-06 DIAGNOSIS — C61 Malignant neoplasm of prostate: Secondary | ICD-10-CM

## 2012-12-06 DIAGNOSIS — R1900 Intra-abdominal and pelvic swelling, mass and lump, unspecified site: Secondary | ICD-10-CM

## 2012-12-07 ENCOUNTER — Other Ambulatory Visit: Payer: Self-pay | Admitting: Radiology

## 2012-12-07 ENCOUNTER — Encounter (HOSPITAL_COMMUNITY): Payer: Self-pay | Admitting: Pharmacy Technician

## 2012-12-08 ENCOUNTER — Ambulatory Visit (HOSPITAL_COMMUNITY)
Admission: RE | Admit: 2012-12-08 | Discharge: 2012-12-08 | Disposition: A | Discharge status: Home / Self Care | Payer: 59 | Source: Ambulatory Visit | Attending: Urology | Admitting: Urology

## 2012-12-08 ENCOUNTER — Encounter (HOSPITAL_COMMUNITY): Payer: Self-pay

## 2012-12-08 DIAGNOSIS — D360 Benign neoplasm of lymph nodes: Secondary | ICD-10-CM | POA: Insufficient documentation

## 2012-12-08 DIAGNOSIS — C61 Malignant neoplasm of prostate: Secondary | ICD-10-CM | POA: Insufficient documentation

## 2012-12-08 DIAGNOSIS — R1900 Intra-abdominal and pelvic swelling, mass and lump, unspecified site: Secondary | ICD-10-CM

## 2012-12-08 LAB — PROTIME-INR
INR: 0.88 (ref 0.00–1.49)
Prothrombin Time: 11.9 seconds (ref 11.6–15.2)

## 2012-12-08 LAB — APTT: aPTT: 28 seconds (ref 24–37)

## 2012-12-08 LAB — CBC
HCT: 44.2 % (ref 39.0–52.0)
Hemoglobin: 16.5 g/dL (ref 13.0–17.0)
MCH: 34.7 pg — ABNORMAL HIGH (ref 26.0–34.0)
MCHC: 37.3 g/dL — ABNORMAL HIGH (ref 30.0–36.0)
MCV: 92.9 fL (ref 78.0–100.0)
Platelets: 209 10*3/uL (ref 150–400)
RBC: 4.76 MIL/uL (ref 4.22–5.81)
RDW: 12.3 % (ref 11.5–15.5)
WBC: 6 10*3/uL (ref 4.0–10.5)

## 2012-12-08 MED ORDER — MIDAZOLAM HCL 2 MG/2ML IJ SOLN
INTRAMUSCULAR | Status: AC
  Filled 2012-12-08: qty 6

## 2012-12-08 MED ORDER — SODIUM CHLORIDE 0.9 % IV SOLN
INTRAVENOUS | Status: DC
  Administered 2012-12-08: 11:00:00 via INTRAVENOUS

## 2012-12-08 MED ORDER — MIDAZOLAM HCL 2 MG/2ML IJ SOLN
INTRAMUSCULAR | Status: AC | PRN
  Administered 2012-12-08: 0.5 mg via INTRAVENOUS
  Administered 2012-12-08 (×3): 1 mg via INTRAVENOUS

## 2012-12-08 MED ORDER — FENTANYL CITRATE 0.05 MG/ML IJ SOLN
INTRAMUSCULAR | Status: AC | PRN
  Administered 2012-12-08: 50 ug via INTRAVENOUS
  Administered 2012-12-08: 25 ug via INTRAVENOUS
  Administered 2012-12-08 (×2): 50 ug via INTRAVENOUS

## 2012-12-08 MED ORDER — FENTANYL CITRATE 0.05 MG/ML IJ SOLN
INTRAMUSCULAR | Status: AC
  Filled 2012-12-08: qty 6

## 2012-12-08 NOTE — Discharge Instructions (Signed)
Needle Biopsy Care After These instructions give you information on caring for yourself after your procedure. Your doctor may also give you more specific instructions. Call your doctor if you have any problems or questions after your procedure. HOME CARE  Rest for 4 hours after your biopsy, except for getting up to go to the bathroom or as told.  Keep the places where the needles were put in clean and dry.  Do not put powder or lotion on the sites.  Do not shower until 24 hours after the test. Remove all bandages (dressings) before showering.  Remove all bandages at least once every day. Gently clean the sites with soap and water. Keep putting a new bandage on until the skin is closed. Finding out the results of your test Ask your doctor when your test results will be ready. Make sure you follow up and get the test results. GET HELP RIGHT AWAY IF:   You have shortness of breath or trouble breathing.  You have pain or cramping in your belly (abdomen).  You feel sick to your stomach (nauseous) or throw up (vomit).  Any of the places where the needles were put in:  Are puffy (swollen) or red.  Are sore or hot to the touch.  Are draining yellowish-white fluid (pus).  Are bleeding after 10 minutes of pressing down on the site. Have someone keep pressing on any place that is bleeding until you see a doctor.  You have any unusual pain that will not stop.  You have a fever. If you go to the emergency room, tell the nurse that you had a biopsy. Take this paper with you to show the nurse. MAKE SURE YOU:   Understand these instructions.  Will watch your condition.  Will get help right away if you are not doing well or get worse. Document Released: 06/25/2008 Document Revised: 10/05/2011 Document Reviewed: 06/25/2008 [facility] Patient Information 2013 [place], [place]. Moderate Sedation, Adult Moderate sedation is given to help you relax or even sleep through a procedure. You may  remain sleepy, be clumsy, or have poor balance for several hours following this procedure. Arrange for a responsible adult, family member, or friend to take you home. A responsible adult should stay with you for at least 24 hours or until the medicines have worn off.  Do not participate in any activities where you could become injured for the next 24 hours, or until you feel normal again. Do not:  Drive.  Swim.  Ride a bicycle.  Operate heavy machinery.  Cook.  Use power tools.  Climb ladders.  Work at [employer].  Do not make important decisions or sign legal documents until you are improved.  Vomiting may occur if you eat too soon. When you can drink without vomiting, try water, juice, or soup. Try solid foods if you feel little or no nausea.  Only take over-the-counter or prescription medications for pain, discomfort, or fever as directed by your caregiver.If pain medications have been prescribed for you, ask your caregiver how soon it is safe to take them.  Make sure you and your family fully understands everything about the medication given to you. Make sure you understand what side effects may occur.  You should not drink alcohol, take sleeping pills, or medications that cause drowsiness for at least 24 hours.  If you smoke, do not smoke alone.  If you are feeling better, you may resume normal activities 24 hours after receiving sedation.  Keep all appointments as scheduled.  Follow all instructions.  Ask questions if you do not understand. SEEK MEDICAL CARE IF:   Your skin is pale or bluish in color.  You continue to feel sick to your stomach (nauseous) or throw up (vomit).  Your pain is getting worse and not helped by medication.  You have bleeding or swelling.  You are still sleepy or feeling clumsy after 24 hours. SEEK IMMEDIATE MEDICAL CARE IF:   You develop a rash.  You have difficulty breathing.  You develop any type of allergic problem.  You have a  fever. Document Released: 04/07/2001 Document Revised: 10/05/2011 Document Reviewed: 08/29/2007 [facility] Patient Information 2013 [place], [place].

## 2012-12-08 NOTE — H&P (Signed)
[name] Holder is an 57 y.o. male.   Chief Complaint: "I'm here for a biopsy of lymph nodes in my belly" HPI: Patient with history of prostate carcinoma and recent staging CT revealing pelvic/retroperitoneal lymphadenopathy presents today for CT guided biopsy of the most accessible pelvic/RP lymph node.  Past Medical History  Diagnosis Date  . Asthma   . Allergy   . Hypertension   . Hx of colonic polyps   . History of kidney stones   . Prostate cancer   . Elevated PSA 10/03/2012    23.37    Past Surgical History  Procedure Laterality Date  . Rotator cuff repair  2012  . Hernia repair  ?2008  . Prostate biopsy      11/12 positive biopsies  . Colonoscopy      Family History  Problem Relation Age of Onset  . Arthritis Mother   . Diabetes Mother   . Hypertension Mother   . Hyperlipidemia Mother   . Hypertension Father   . Cancer Father     Prostate Cancer/radiation mid 21's  . Stroke Maternal Grandfather   . Cancer Sister     precancerous colon polyps, 6 in colon removed   Social History:  reports that he quit smoking about 16 years ago. His smoking use included Cigarettes. He has a 15 pack-year smoking history. He has never used smokeless tobacco. He reports that he drinks about 1.2 ounces of alcohol per week. He reports that he does not use illicit drugs.  Allergies:  Allergies  Allergen Reactions  . Tetracycline Rash    Current outpatient prescriptions:amLODipine (NORVASC) 10 MG tablet, Take 10 mg by mouth every morning., Disp: , Rfl: ;  amoxicillin (AMOXIL) 500 MG tablet, Take 500 mg by mouth 3 (three) times daily. For 10 days, Disp: , Rfl: ;  azelastine (ASTELIN) 137 MCG/SPRAY nasal spray, Place 2 sprays into the nose 2 (two) times daily. Use in each nostril as directed, Disp: 30 mL, Rfl: 5 budesonide-formoterol (SYMBICORT) 160-4.5 MCG/ACT inhaler, Take 2 puffs first thing in am and then another 2 puffs about 12 hours later., Disp: 1 Inhaler, Rfl: 12;   mometasone (NASONEX) 50 MCG/ACT nasal spray, Place 2 sprays into the nose daily. 1 spray in each nostril daily, Disp: 17 g, Rfl: 5;  mupirocin cream (BACTROBAN) 2 %, Apply 1 application topically 3 (three) times daily. Apply to lower inner leg, Disp: , Rfl:  valsartan-hydrochlorothiazide (DIOVAN-HCT) 160-12.5 MG per tablet, Take 1 tablet by mouth every morning., Disp: , Rfl: ;  albuterol (PROVENTIL HFA;VENTOLIN HFA) 108 (90 BASE) MCG/ACT inhaler, Inhale 2 puffs into the lungs every 6 (six) hours as needed for wheezing., Disp: 1 Inhaler, Rfl: 6 Current facility-administered medications:0.9 %  sodium chloride infusion, , Intravenous, Continuous, Brayton El, PA-C, Last Rate: 20 mL/hr at 12/08/12 1120   Results for orders placed during the hospital encounter of 12/08/12 (from the past 48 hour(s))  APTT     Status: None   Collection Time    12/08/12 11:33 AM      Result Value Range   aPTT 28  24 - 37 seconds  PROTIME-INR     Status: None   Collection Time    12/08/12 11:33 AM      Result Value Range   Prothrombin Time 11.9  11.6 - 15.2 seconds   INR 0.88  0.00 - 1.49   Results for orders placed during the hospital encounter of 12/08/12  APTT      Result Value  Range   aPTT 28  24 - 37 seconds  PROTIME-INR      Result Value Range   Prothrombin Time 11.9  11.6 - 15.2 seconds   INR 0.88  0.00 - 1.49  12/08/12 CBC pending  Review of Systems  Constitutional: Negative for fever and chills.  Respiratory: Negative for cough and shortness of breath.   Cardiovascular: Negative for chest pain.  Gastrointestinal: Negative for nausea, vomiting and abdominal pain.  Genitourinary: Negative for hematuria.  Musculoskeletal: Negative for back pain.  Neurological: Negative for headaches.  Endo/Heme/Allergies: Does not bruise/bleed easily.    Pulse 92, temperature 98.5 F (36.9 C), temperature source Oral, resp. rate 20, height 5\' 9"  (1.753 m), weight 194 lb (87.998 kg), SpO2 98.00%. Physical Exam   Constitutional: He is oriented to person, place, and time. He appears well-developed and well-nourished.  Cardiovascular: Normal rate and regular rhythm.   Respiratory: Effort normal and breath sounds normal.  GI: Soft. Bowel sounds are normal. There is no tenderness.  Musculoskeletal: Normal range of motion. He exhibits no edema.  Neurological: He is alert and oriented to person, place, and time.     Assessment/Plan Pt with prostate carcinoma and pelvic/RP adenopathy. Plan is for CT guided biopsy of the most accessible RP/pelvic lymph node. Details/risks of procedure d/w pt with his understanding and consent.  Delance Weide,D KEVIN 12/08/2012, 12:05 PM

## 2012-12-08 NOTE — Procedures (Signed)
Procedure:  CT guided core biopsy of left iliac lymph node Findings:  CT guided 18 G core biopsy x 3 of left external iliac lymph node via 17 G needle.  No complications.

## 2012-12-08 NOTE — H&P (Signed)
Agree 

## 2012-12-21 ENCOUNTER — Other Ambulatory Visit: Payer: Self-pay | Admitting: Urology

## 2012-12-26 ENCOUNTER — Encounter (HOSPITAL_COMMUNITY): Payer: Self-pay | Admitting: Pharmacy Technician

## 2012-12-28 ENCOUNTER — Other Ambulatory Visit (HOSPITAL_COMMUNITY): Payer: Self-pay | Admitting: Urology

## 2012-12-28 NOTE — Patient Instructions (Addendum)
[name] Holder  12/28/2012   Your procedure is scheduled on:  01/05/13  THURSDAY  Report to [facility] at 0830       AM.  Call this number if you have problems the morning of surgery: [phone]       Remember:   Do not eat food after Midnight.  TUESDAY NIGHT, BEGIN CLEAR LIQUIDS Wednesday AS DIRECTED BY OFFICE FOR BOWEL PREP.  NOTHING BY MOUTH AFTER MIDNIGHT Wednesday NIGHT   Take these medicines the morning of surgery with A SIP OF WATER: Norvasc     Symbicort                                   May use Albuterol if needed   .  Contacts, dentures or partial plates can not be worn to surgery  Leave suitcase in the car. After surgery it may be brought to your room.  For patients admitted to the hospital, checkout time is 11:00 AM day of  discharge.             SPECIAL INSTRUCTIONS- SEE [facility] PREPARING FOR SURGERY INSTRUCTION SHEET-     DO NOT WEAR JEWELRY, LOTIONS, POWDERS, OR PERFUMES.  WOMEN-- DO NOT SHAVE LEGS OR UNDERARMS FOR 12 HOURS BEFORE SHOWERS. MEN MAY SHAVE FACE.  Patients discharged the day of surgery will not be allowed to drive home. IF going home the day of surgery, you must have a driver and someone to stay with you for the first 24 hours  Name and phone number of your driver:     admission                                                                   Please read over the following fact sheets that you were given: MRSA Information, Incentive Spirometry Sheet, Blood Transfusion Sheet  Information                                                                                   Sharon  PST [phone]  [phone]                 FAILURE TO FOLLOW THESE INSTRUCTIONS MAY RESULT IN  CANCELLATION   OF YOUR SURGERY                                                  Patient Signature _____________________________

## 2012-12-29 ENCOUNTER — Encounter (HOSPITAL_COMMUNITY): Payer: Self-pay

## 2012-12-29 ENCOUNTER — Ambulatory Visit (HOSPITAL_COMMUNITY)
Admission: RE | Admit: 2012-12-29 | Discharge: 2012-12-29 | Disposition: A | Discharge status: Home / Self Care | Payer: 59 | Source: Ambulatory Visit | Attending: Urology | Admitting: Urology

## 2012-12-29 ENCOUNTER — Encounter (HOSPITAL_COMMUNITY)
Admission: RE | Admit: 2012-12-29 | Discharge: 2012-12-29 | Disposition: A | Discharge status: Home / Self Care | Payer: 59 | Source: Ambulatory Visit | Attending: Urology | Admitting: Urology

## 2012-12-29 ENCOUNTER — Other Ambulatory Visit (HOSPITAL_COMMUNITY): Payer: Self-pay | Admitting: Urology

## 2012-12-29 DIAGNOSIS — Z87891 Personal history of nicotine dependence: Secondary | ICD-10-CM | POA: Insufficient documentation

## 2012-12-29 DIAGNOSIS — Z01810 Encounter for preprocedural cardiovascular examination: Secondary | ICD-10-CM | POA: Insufficient documentation

## 2012-12-29 DIAGNOSIS — Z01818 Encounter for other preprocedural examination: Secondary | ICD-10-CM | POA: Insufficient documentation

## 2012-12-29 DIAGNOSIS — I10 Essential (primary) hypertension: Secondary | ICD-10-CM | POA: Insufficient documentation

## 2012-12-29 DIAGNOSIS — Z01812 Encounter for preprocedural laboratory examination: Secondary | ICD-10-CM | POA: Insufficient documentation

## 2012-12-29 LAB — CBC
HCT: 44.7 % (ref 39.0–52.0)
Hemoglobin: 16.3 g/dL (ref 13.0–17.0)
MCH: 34.1 pg — ABNORMAL HIGH (ref 26.0–34.0)
MCHC: 36.5 g/dL — ABNORMAL HIGH (ref 30.0–36.0)
MCV: 93.5 fL (ref 78.0–100.0)
Platelets: 241 10*3/uL (ref 150–400)
RBC: 4.78 MIL/uL (ref 4.22–5.81)
RDW: 12.2 % (ref 11.5–15.5)
WBC: 7.6 10*3/uL (ref 4.0–10.5)

## 2012-12-29 LAB — BASIC METABOLIC PANEL
BUN: 11 mg/dL (ref 6–23)
CO2: 28 mEq/L (ref 19–32)
Calcium: 10 mg/dL (ref 8.4–10.5)
Chloride: 95 mEq/L — ABNORMAL LOW (ref 96–112)
Creatinine, Ser: 0.78 mg/dL (ref 0.50–1.35)
GFR calc Af Amer: >90 mL/min (ref >90)
GFR calc non Af Amer: >90 mL/min (ref >90)
Glucose, Bld: 95 mg/dL (ref 70–99)
Potassium: 4 mEq/L (ref 3.5–5.1)
Sodium: 133 mEq/L — ABNORMAL LOW (ref 135–145)

## 2012-12-29 LAB — SURGICAL PCR SCREEN
MRSA, PCR: NEGATIVE
Staphylococcus aureus: NEGATIVE

## 2012-12-29 NOTE — Progress Notes (Signed)
12/29/12 1506  OBSTRUCTIVE SLEEP APNEA  Do you snore loudly (loud enough to be heard through closed doors)?  1  Do you often feel tired, fatigued, or sleepy during the daytime? 0  Has anyone observed you stop breathing during your sleep? 1  Do you have, or are you being treated for high blood pressure? 1  BMI more than 35 kg/m2? 0  Age over 59 years old? 1  Neck circumference greater than 40 cm/18 inches? 0  Gender: 1  Obstructive Sleep Apnea Score 5  Score 4 or greater  Results sent to PCP

## 2012-12-29 NOTE — Progress Notes (Signed)
12/29/12 1333  OBSTRUCTIVE SLEEP APNEA  Have you ever been diagnosed with sleep apnea through a sleep study? No  Do you snore loudly (loud enough to be heard through closed doors)?  1  Do you often feel tired, fatigued, or sleepy during the daytime? 0  Has anyone observed you stop breathing during your sleep? 1  Do you have, or are you being treated for high blood pressure? 1  Age over 59 years old? 1  Neck circumference greater than 40 cm/18 inches? 0  Gender: 1  Obstructive Sleep Apnea Score 5  Score 4 or greater  Results sent to PCP

## 2013-01-04 NOTE — H&P (Signed)
Chief Complaint  Prostate Cancer     History of Present Illness  [name] Holder is a 57 year old who was noted to have an elevated PSA of 23.4 and an abnormal digital rectal exam with nodularity involving the entire left lobe of the prostate.  He was seen by Dr. Patsi Sears and underwent a prostate biopsy on 10/21/12 which confirmed Gleason 3+4=7 adenocarcinoma with 11 out of 12 biopsy cores positive for malignancy with very high volume disease in each core.  On TRUS, he was felt to have suspicion for extraprostatic extension. He underwent staging studies which included a CT scan of the abdomen and pelvis (10/31/12) which demonstrated a 1.2 cm left common iliac lymph node, small < 1 cm para-aortic and interaortocaval lymph nodes, a 1.2 cm left external iliac lymph node, and small < 1 cm right external iliac lymph nodes.  A bone scan on 11/10/12 was negative except for mild focal uptake in the right 10th rib consistent with an old fracture.  He has a paternal family history of prostate cancer.  He has seen Dr. Dayton Scrape in consultation who has discussed radiotherapy options and discussed treatment with 5 weeks of EBRT including treatment of the pelvic lymph nodes with a radiation seed implant boost and long term androgen deprivation therapy as an option. He is here today to discuss possible surgical treatment as a part of a multimodality approach to his disease.  TNM stage: cT3a N0-1 M0 PSA: 23.4 Gleason score: 3+4=7 Biopsy (10/21/12): 11 of 12 biopsy cores positive   Left: L lateral apex (90%, 3+4=7), L apex (90%, 3+4=7), L lateral mid (90%, 3+4=7), L mid (90%, 3+4=7), L lateral base (90%, 3+4=7, PNI), L base (95%, 3+4=7, PNI)    Right: R apex (90%, 3+3=6), R mid (90%, 3+4=7), R lateral mid (95%, 3+4=7, PNI), R base (95%, 3+3=6, PNI), R lateral base (95%, 3+4=7, PNI) Prostate volume: 30.2 cc  Nomogram OC disease: 14% EPE: 75% SVI: 46% LNI: 25.1% PFS (surgery): 52% at 3 years, 37% at 8 years CSS: 98% at  5 years, 95% at 10 years  Urinary function: He has minimal symptoms. IPSS is 3. Erectile function: He denies significant erectile dysfunction. SHIM score is 23.   Past Medical History Problems  1. History of  Asthma 493.90 2. Former Smoker V15.82 3. History of  Hypertension 401.9 4. History of  Nephrolithiasis V13.01  Surgical History Problems  1. History of  Rotator Cuff Repair 2. History of  Umbilical Hernia Repair   He does believe that he had mesh placed at the time of his umbilical hernia repair.   Current Meds 1. Albuterol Sulfate HFA 108 MCG/ACT AERS; Therapy: (Recorded:06Mar2014) to 2. AmLODIPine Besylate 10 MG Oral Tablet; Therapy: (Recorded:06Mar2014) to 3. Astelin 137 MCG/SPRAY SOLN; Therapy: (Recorded:06Mar2014) to 4. Diovan HCT 160-12.5 MG Oral Tablet; Therapy: (Recorded:06Mar2014) to 5. Nasonex 50 MCG/ACT Nasal Suspension; Therapy: (Recorded:06Mar2014) to 6. Symbicort 160-4.5 MCG/ACT Inhalation Aerosol; Therapy: (Recorded:06Mar2014) to  Allergies Medication  1. Tetracyclines  Family History Problems  1. Maternal history of  Arthritis V17.7 2. Maternal history of  Diabetes Mellitus V18.0 3. Family history of  Father Deceased At Age 78 4. Maternal history of  Hyperlipidemia 5. Maternal history of  Hypertension V17.49 6. Paternal history of  Hypertension V17.49 7. Paternal history of  Prostate Cancer V16.42 8. Maternal grandfather's history of  Stroke Syndrome V17.1  Social History Problems    Alcohol Use 1-2 per day   Marital History - Currently Married   Occupation:  Quality Mgr @ [employer]  Review of Systems Constitutional, skin, eye, otolaryngeal, hematologic/lymphatic, cardiovascular, pulmonary, endocrine, musculoskeletal, gastrointestinal, neurological and psychiatric system(s) were reviewed and pertinent findings if present are noted.    Vitals  BMI Calculated: 29.03 BSA Calculated: 2.05 Height: 5 ft 9 in Weight: 196 lb    Physical  Exam Constitutional: Well nourished and well developed . No acute distress.  ENT:. The ears and nose are normal in appearance.  Neck: The appearance of the neck is normal and no neck mass is present.  Pulmonary: No respiratory distress, normal respiratory rhythm and effort and clear bilateral breath sounds.  Cardiovascular: Heart rate and rhythm are normal . No peripheral edema.  Abdomen: periumbilical incision site(s) well healed. The abdomen is soft and nontender. No masses are palpated. No CVA tenderness. No hernias are palpable. No hepatosplenomegaly noted.      Assessment Assessed  1. Prostate Cancer 185    Discussion/Summary  1. Locally advanced and possible lymph node positive prostate cancer: He had undergone a lymph node biopsy which was benign.  He has elected to proceed with initial surgical therapy. Based on the concern about possible lymph node involvement, he will undergo a lymphadenectomy first with frozen section analysis if concerning nodes are identified and I will proceed with a robotic prostatectomy only if his lymph nodes are not found to be positive. I discussed the potential benefits and risks of the procedure, side effects of the proposed treatment, the likelihood of the patient achieving the goals of the procedure, and any potential problems that might occur during the procedure or recuperation.

## 2013-01-05 ENCOUNTER — Encounter (HOSPITAL_COMMUNITY): Payer: Self-pay | Admitting: Anesthesiology

## 2013-01-05 ENCOUNTER — Inpatient Hospital Stay (HOSPITAL_COMMUNITY)
Admission: RE | Admit: 2013-01-05 | Discharge: 2013-01-06 | DRG: 715 | Disposition: A | Discharge status: Home / Self Care | Payer: 59 | Source: Ambulatory Visit | Attending: Urology | Admitting: Urology

## 2013-01-05 ENCOUNTER — Encounter (HOSPITAL_COMMUNITY)
Admission: RE | Disposition: A | Discharge status: Home / Self Care | Payer: Self-pay | Source: Ambulatory Visit | Attending: Urology

## 2013-01-05 ENCOUNTER — Inpatient Hospital Stay (HOSPITAL_COMMUNITY): Payer: 59 | Admitting: Anesthesiology

## 2013-01-05 ENCOUNTER — Encounter (HOSPITAL_COMMUNITY): Payer: Self-pay | Admitting: *Deleted

## 2013-01-05 DIAGNOSIS — I10 Essential (primary) hypertension: Secondary | ICD-10-CM | POA: Diagnosis present

## 2013-01-05 DIAGNOSIS — Z87891 Personal history of nicotine dependence: Secondary | ICD-10-CM

## 2013-01-05 DIAGNOSIS — C775 Secondary and unspecified malignant neoplasm of intrapelvic lymph nodes: Secondary | ICD-10-CM | POA: Diagnosis present

## 2013-01-05 DIAGNOSIS — Z8042 Family history of malignant neoplasm of prostate: Secondary | ICD-10-CM

## 2013-01-05 DIAGNOSIS — C61 Malignant neoplasm of prostate: Principal | ICD-10-CM | POA: Diagnosis present

## 2013-01-05 HISTORY — PX: LYMPHADENECTOMY: SHX5960

## 2013-01-05 LAB — TYPE AND SCREEN
ABO/RH(D): A POS
Antibody Screen: NEGATIVE

## 2013-01-05 LAB — ABO/RH: ABO/RH(D): A POS

## 2013-01-05 SURGERY — LYMPHADENECTOMY, OPEN
Anesthesia: General | Wound class: Clean

## 2013-01-05 MED ORDER — LACTATED RINGERS IV SOLN
INTRAVENOUS | Status: DC | PRN
  Administered 2013-01-05: 12:00:00

## 2013-01-05 MED ORDER — PROMETHAZINE HCL 25 MG/ML IJ SOLN: 6.2500 mg | INTRAMUSCULAR | Status: DC | PRN

## 2013-01-05 MED ORDER — NEOSTIGMINE METHYLSULFATE 1 MG/ML IJ SOLN
INTRAMUSCULAR | Status: DC | PRN
  Administered 2013-01-05: 4 mg via INTRAVENOUS

## 2013-01-05 MED ORDER — ONDANSETRON HCL 4 MG/2ML IJ SOLN
INTRAMUSCULAR | Status: DC | PRN
  Administered 2013-01-05: 4 mg via INTRAVENOUS

## 2013-01-05 MED ORDER — ROCURONIUM BROMIDE 100 MG/10ML IV SOLN
INTRAVENOUS | Status: DC | PRN
  Administered 2013-01-05: 30 mg via INTRAVENOUS
  Administered 2013-01-05: 20 mg via INTRAVENOUS

## 2013-01-05 MED ORDER — FENTANYL CITRATE 0.05 MG/ML IJ SOLN: 25.0000 ug | INTRAMUSCULAR | Status: DC | PRN

## 2013-01-05 MED ORDER — PNEUMOCOCCAL VAC POLYVALENT 25 MCG/0.5ML IJ INJ
0.5000 mL | INJECTION | INTRAMUSCULAR | Status: AC
  Administered 2013-01-06: 0.5 mL via INTRAMUSCULAR
  Filled 2013-01-05 (×2): qty 0.5

## 2013-01-05 MED ORDER — MIDAZOLAM HCL 5 MG/5ML IJ SOLN
INTRAMUSCULAR | Status: DC | PRN
  Administered 2013-01-05: 2 mg via INTRAVENOUS

## 2013-01-05 MED ORDER — KETOROLAC TROMETHAMINE 15 MG/ML IJ SOLN
15.0000 mg | Freq: Four times a day (QID) | INTRAMUSCULAR | Status: DC
  Administered 2013-01-05 – 2013-01-06 (×3): 15 mg via INTRAVENOUS
  Filled 2013-01-05 (×4): qty 1

## 2013-01-05 MED ORDER — FENTANYL CITRATE 0.05 MG/ML IJ SOLN
INTRAMUSCULAR | Status: DC | PRN
  Administered 2013-01-05: 50 ug via INTRAVENOUS
  Administered 2013-01-05: 100 ug via INTRAVENOUS
  Administered 2013-01-05 (×2): 50 ug via INTRAVENOUS

## 2013-01-05 MED ORDER — KCL IN DEXTROSE-NACL 20-5-0.45 MEQ/L-%-% IV SOLN
INTRAVENOUS | Status: DC
  Administered 2013-01-05 – 2013-01-06 (×2): via INTRAVENOUS
  Filled 2013-01-05 (×2): qty 1000

## 2013-01-05 MED ORDER — CEFAZOLIN SODIUM 1-5 GM-% IV SOLN
1.0000 g | Freq: Three times a day (TID) | INTRAVENOUS | Status: AC
  Administered 2013-01-05 – 2013-01-06 (×2): 1 g via INTRAVENOUS
  Filled 2013-01-05 (×4): qty 50

## 2013-01-05 MED ORDER — LACTATED RINGERS IV SOLN
INTRAVENOUS | Status: DC
  Administered 2013-01-05: 13:00:00 via INTRAVENOUS
  Administered 2013-01-05: 1000 mL via INTRAVENOUS

## 2013-01-05 MED ORDER — SODIUM CHLORIDE 0.9 % IR SOLN
Status: DC | PRN
  Administered 2013-01-05: 1000 mL

## 2013-01-05 MED ORDER — MORPHINE SULFATE 2 MG/ML IJ SOLN: 2.0000 mg | INTRAMUSCULAR | Status: DC | PRN

## 2013-01-05 MED ORDER — CIPROFLOXACIN HCL 500 MG PO TABS: 500.0000 mg | ORAL_TABLET | Freq: Two times a day (BID) | ORAL | Status: DC

## 2013-01-05 MED ORDER — ACETAMINOPHEN 325 MG PO TABS
650.0000 mg | ORAL_TABLET | ORAL | Status: DC | PRN
  Administered 2013-01-05: 650 mg via ORAL
  Filled 2013-01-05: qty 2

## 2013-01-05 MED ORDER — HYDROMORPHONE HCL PF 1 MG/ML IJ SOLN
INTRAMUSCULAR | Status: DC | PRN
  Administered 2013-01-05 (×2): 0.5 mg via INTRAVENOUS
  Administered 2013-01-05: 1 mg via INTRAVENOUS

## 2013-01-05 MED ORDER — DIPHENHYDRAMINE HCL 12.5 MG/5ML PO ELIX: 12.5000 mg | ORAL_SOLUTION | Freq: Four times a day (QID) | ORAL | Status: DC | PRN

## 2013-01-05 MED ORDER — HYDROCODONE-ACETAMINOPHEN 5-325 MG PO TABS: 1.0000 | ORAL_TABLET | Freq: Four times a day (QID) | ORAL | Status: DC | PRN

## 2013-01-05 MED ORDER — DIPHENHYDRAMINE HCL 50 MG/ML IJ SOLN: 12.5000 mg | Freq: Four times a day (QID) | INTRAMUSCULAR | Status: DC | PRN

## 2013-01-05 MED ORDER — BUPIVACAINE-EPINEPHRINE 0.25% -1:200000 IJ SOLN
INTRAMUSCULAR | Status: DC | PRN
  Administered 2013-01-05: 28 mL

## 2013-01-05 MED ORDER — MEPERIDINE HCL 50 MG/ML IJ SOLN: 6.2500 mg | INTRAMUSCULAR | Status: DC | PRN

## 2013-01-05 MED ORDER — LACTATED RINGERS IV SOLN: INTRAVENOUS | Status: DC

## 2013-01-05 MED ORDER — CEFAZOLIN SODIUM-DEXTROSE 2-3 GM-% IV SOLR
2.0000 g | INTRAVENOUS | Status: AC
  Administered 2013-01-05: 2 g via INTRAVENOUS

## 2013-01-05 MED ORDER — GLYCOPYRROLATE 0.2 MG/ML IJ SOLN
INTRAMUSCULAR | Status: DC | PRN
  Administered 2013-01-05: .7 mg via INTRAVENOUS

## 2013-01-05 MED ORDER — LIDOCAINE HCL (CARDIAC) 20 MG/ML IV SOLN
INTRAVENOUS | Status: DC | PRN
  Administered 2013-01-05: 50 mg via INTRAVENOUS

## 2013-01-05 MED ORDER — STERILE WATER FOR IRRIGATION IR SOLN
Status: DC | PRN
  Administered 2013-01-05: 3000 mL

## 2013-01-05 MED ORDER — MEPERIDINE HCL 25 MG/ML IJ SOLN
INTRAMUSCULAR | Status: DC | PRN
  Administered 2013-01-05: 25 mg via INTRAVENOUS

## 2013-01-05 MED ORDER — DOCUSATE SODIUM 100 MG PO CAPS
100.0000 mg | ORAL_CAPSULE | Freq: Two times a day (BID) | ORAL | Status: DC
  Administered 2013-01-05: 100 mg via ORAL
  Filled 2013-01-05 (×3): qty 1

## 2013-01-05 MED ORDER — PROPOFOL 10 MG/ML IV BOLUS
INTRAVENOUS | Status: DC | PRN
  Administered 2013-01-05: 150 mg via INTRAVENOUS

## 2013-01-05 SURGICAL SUPPLY — 42 items
CANISTER SUCTION 2500CC (MISCELLANEOUS) ×3 IMPLANT
CATH FOLEY 2WAY SLVR 18FR 30CC (CATHETERS) ×3 IMPLANT
CATH ROBINSON RED A/P 16FR (CATHETERS) ×3 IMPLANT
CATH ROBINSON RED A/P 8FR (CATHETERS) ×3 IMPLANT
CATH TIEMANN FOLEY 18FR 5CC (CATHETERS) ×3 IMPLANT
CHLORAPREP W/TINT 26ML (MISCELLANEOUS) ×3 IMPLANT
CLIP LIGATING HEM O LOK PURPLE (MISCELLANEOUS) ×6 IMPLANT
CLOTH BEACON ORANGE TIMEOUT ST (SAFETY) ×3 IMPLANT
COVER SURGICAL LIGHT HANDLE (MISCELLANEOUS) ×3 IMPLANT
COVER TIP SHEARS 8 DVNC (MISCELLANEOUS) ×2 IMPLANT
COVER TIP SHEARS 8MM DA VINCI (MISCELLANEOUS) ×1
CUTTER ECHEON FLEX ENDO 45 340 (ENDOMECHANICALS) ×3 IMPLANT
DECANTER SPIKE VIAL GLASS SM (MISCELLANEOUS) ×3 IMPLANT
DRAPE SURG IRRIG POUCH 19X23 (DRAPES) ×3 IMPLANT
DRSG TEGADERM 2-3/8X2-3/4 SM (GAUZE/BANDAGES/DRESSINGS) ×12 IMPLANT
DRSG TEGADERM 4X4.75 (GAUZE/BANDAGES/DRESSINGS) ×6 IMPLANT
DRSG TEGADERM 6X8 (GAUZE/BANDAGES/DRESSINGS) ×6 IMPLANT
ELECT REM PT RETURN 9FT ADLT (ELECTROSURGICAL) ×3
ELECTRODE REM PT RTRN 9FT ADLT (ELECTROSURGICAL) ×2 IMPLANT
GAUZE SPONGE 2X2 8PLY STRL LF (GAUZE/BANDAGES/DRESSINGS) ×2 IMPLANT
GLOVE BIO SURGEON STRL SZ 6.5 (GLOVE) ×3 IMPLANT
GLOVE BIOGEL M STRL SZ7.5 (GLOVE) ×6 IMPLANT
GOWN STRL NON-REIN LRG LVL3 (GOWN DISPOSABLE) ×3 IMPLANT
GOWN STRL REIN XL XLG (GOWN DISPOSABLE) ×6 IMPLANT
HOLDER FOLEY CATH W/STRAP (MISCELLANEOUS) ×3 IMPLANT
IV LACTATED RINGERS 1000ML (IV SOLUTION) ×3 IMPLANT
KIT ACCESSORY DA VINCI DISP (KITS) ×1
KIT ACCESSORY DVNC DISP (KITS) ×2 IMPLANT
NDL SAFETY ECLIPSE 18X1.5 (NEEDLE) ×2 IMPLANT
NEEDLE HYPO 18GX1.5 SHARP (NEEDLE) ×1
PACK ROBOT UROLOGY CUSTOM (CUSTOM PROCEDURE TRAY) ×3 IMPLANT
RELOAD GREEN ECHELON 45 (STAPLE) ×3 IMPLANT
SET TUBE IRRIG SUCTION NO TIP (IRRIGATION / IRRIGATOR) ×3 IMPLANT
SOLUTION ELECTROLUBE (MISCELLANEOUS) ×3 IMPLANT
SPONGE GAUZE 2X2 STER 10/PKG (GAUZE/BANDAGES/DRESSINGS) ×1
SUT ETHILON 3 0 PS 1 (SUTURE) ×3 IMPLANT
SUT MNCRL AB 4-0 PS2 18 (SUTURE) ×6 IMPLANT
SUT VICRYL 0 UR6 27IN ABS (SUTURE) ×6 IMPLANT
SYR 27GX1/2 1ML LL SAFETY (SYRINGE) ×3 IMPLANT
TOWEL OR 17X26 10 PK STRL BLUE (TOWEL DISPOSABLE) ×3 IMPLANT
TOWEL OR NON WOVEN STRL DISP B (DISPOSABLE) ×3 IMPLANT
WATER STERILE IRR 1500ML POUR (IV SOLUTION) ×6 IMPLANT

## 2013-01-05 NOTE — Transfer of Care (Signed)
Immediate Anesthesia Transfer of Care Note  Patient: [name] Holder  Procedure(s) Performed: Procedure(s) (LRB): ROBOTIC LYMPHADENECTOMY (Left)  Patient Location: PACU  Anesthesia Type: General  Level of Consciousness: sedated, patient cooperative and responds to stimulaton  Airway & Oxygen Therapy: Patient Spontanous Breathing and Patient connected to face mask oxgen  Post-op Assessment: Report given to PACU RN and Post -op Vital signs reviewed and stable  Post vital signs: Reviewed and stable  Complications: No apparent anesthesia complications

## 2013-01-05 NOTE — Anesthesia Postprocedure Evaluation (Addendum)
  Anesthesia Post-op Note  Patient: [name] Holder  Procedure(s) Performed: Procedure(s) (LRB): ROBOTIC LYMPHADENECTOMY (Left)  Patient Location: PACU  Anesthesia Type: General  Level of Consciousness: awake and alert   Airway and Oxygen Therapy: Patient Spontanous Breathing  Post-op Pain: mild  Post-op Assessment: Post-op Vital signs reviewed, Patient's Cardiovascular Status Stable, Respiratory Function Stable, Patent Airway and No signs of Nausea or vomiting  Last Vitals:  Filed Vitals:   01/05/13 1343  BP: 124/91  Pulse: 89  Temp: 36.6 C  Resp: 14    Post-op Vital Signs: stable   Complications: No apparent anesthesia complications

## 2013-01-05 NOTE — Anesthesia Preprocedure Evaluation (Addendum)
Anesthesia Evaluation  Patient identified by MRN, date of birth, ID band Patient awake    Reviewed: Allergy & Precautions, H&P , NPO status , Patient's Chart, lab work & pertinent test results  Airway Mallampati: II TM Distance: >3 FB Neck ROM: Full    Dental no notable dental hx.    Pulmonary asthma , Current Smoker,  breath sounds clear to auscultation  Pulmonary exam normal       Cardiovascular hypertension, Pt. on medications Rhythm:Regular Rate:Normal     Neuro/Psych negative neurological ROS  negative psych ROS   GI/Hepatic negative GI ROS, Neg liver ROS,   Endo/Other  negative endocrine ROS  Renal/GU negative Renal ROS  negative genitourinary   Musculoskeletal negative musculoskeletal ROS (+)   Abdominal   Peds negative pediatric ROS (+)  Hematology negative hematology ROS (+)   Anesthesia Other Findings   Reproductive/Obstetrics negative OB ROS                         Anesthesia Physical Anesthesia Plan  ASA: II  Anesthesia Plan: General   Post-op Pain Management:    Induction: Intravenous  Airway Management Planned: Oral ETT  Additional Equipment:   Intra-op Plan:   Post-operative Plan: Extubation in OR  Informed Consent: I have reviewed the patients History and Physical, chart, labs and discussed the procedure including the risks, benefits and alternatives for the proposed anesthesia with the patient or authorized representative who has indicated his/her understanding and acceptance.   Dental advisory given  Plan Discussed with: CRNA  Anesthesia Plan Comments:         Anesthesia Quick Evaluation

## 2013-01-05 NOTE — Op Note (Signed)
Preoperative diagnosis:  1. Prostate cancer  Postoperative diagnosis: 1. Prostate cancer  Procedure(s): 1. Left pelvic lymphadenectomy  Surgeon: Dr. Rolly Salter, Jr  Assistant.: Lujean Rave, PA-C  Anesthesia: General  Complications: None  EBL: 50 cc  Specimens: 1.  The left external iliac and obturator lymph nodes were sent for frozen section analysis. 2.  The left hypogastric lymph nodes were sent for permanent pathologic analysis.  Disposition of specimens: Pathology  Intraoperative findings: Intraoperative frozen section analysis of the left external iliac and obturator lymph nodes did reveal metastatic carcinoma.  The operative findings demonstrated a concerning palpable lymph node in the left external iliac area as seen on preoperative imaging.  Indication: [name] Holder is a 57 year old gentleman who was diagnosed with prostate cancer.  He underwent a staging evaluation due to the locally advanced nature of his disease and was noted to have concerning pelvic and retroperitoneal lymphadenopathy.  He underwent a biopsy of his left external iliac lymph node which was negative for malignancy.  We therefore discussed proceeding with therapy of curative intent and all options were discussed.  His goal was to proceed with primary surgical therapy.  We discussed proceeding with a lymphadenectomy and if there was no definite evidence of metastatic lymphadenopathy, we agreed to proceed with a robotic-assisted laparoscopic radical prostatectomy.  The potential risks, complications, alternative options, and expected recovery process was discussed in detail with the patient.  Informed consent was obtained.  Description of procedure:  The patient was taken to the operating room and a general anesthetic was administered.  He was given preoperative antibiotics, placed in the dorsal lithotomy position, and prepped and draped in the usual sterile fashion.  Next a preoperative timeout was  performed.  A Foley catheter was placed and a site was selected just superior to the umbilicus for placement of the camera port.  This was placed using a standard open Hassan technique which allowed entry into the peritoneal cavity under direct vision without difficulty.  A 12 mm port was placed and a pneumoperitoneum established.  The camera was used to inspect the abdomen and there was no evidence of any intra-abdominal adhesions or other abnormalities.  The remaining ports were then placed with 8 mm robotic ports placed in the right lower quadrant, left lower quadrant, and far left lower quadrant.  An additional 12 mm port was placed in the far right lateral abdominal wall and a 5 mm port was placed in the right upper quadrant.  All ports were placed under direct vision and without difficulty. The surgical cart was then docked.  The bladder was then reflected posteriorly allowing exposure of the lateral pelvic sidewalls.  Based on the concerning lymph nodes seen on his preoperative imaging, attention turned to the left pelvic sidewall and a pelvic lymphadenectomy was performed.  The fibrofatty tissue was removed extending from the external iliac vein to the confluence of the iliac vessels proximally, the bladder medially, the hypogastric artery posteriorly, and Cooper's ligament inferiorly. Sharp and blunt dissection was utilized as necessary and Weck clips were used for hemostasis and lymphostasis. Care was taken not to injure the obturator nerve.  There was noted to be a large matted lymph node between the external iliac vein and obturator nerve.  This lymph node packet was removed from the body within an Endopouch retrieval bag and examined on the back table.  This node did appear to be firm and was concerning for malignancy.  Therefore, this was sent for frozen section analysis  and was found to be positive for metastatic adenocarcinoma.  Based on my preoperative discussions with [name], we decided  not to proceed with his radical prostatectomy considering that his chance for cure with surgical resection of his prostate would be minimal and would not appear to outweigh the risks.  The hypogastric lymph nodes were also removed and sent for permanent pathologic analysis.  The surgical cart was undocked.  The 12 mm right lateral port site was closed with a 0 Vicryl suture placed laparoscopically.  All remaining ports were removed under direct vision.  The camera port site was closed with 2-0 Vicryl sutures.  All skin incisions were injected with quarter percent Marcaine and reapproximated at the skin level with 4-0 Monocryl subcuticular sutures.  Dermabond was applied to the skin. His Foley catheter was removed.  The patient appeared to tolerate the procedure well and without complications.  He was able to be extubated and transferred to the recovery unit in satisfactory condition.

## 2013-01-06 ENCOUNTER — Encounter (HOSPITAL_COMMUNITY): Payer: Self-pay | Admitting: Urology

## 2013-01-06 NOTE — Discharge Summary (Signed)
  Date of admission: 01/05/2013  Date of discharge: 01/06/2013  Admission diagnosis: Prostate Cancer  Discharge diagnosis: Prostate Cancer  History and Physical: For full details, please see admission history and physical. Briefly, [name] Holder is a 57 y.o. year old patient with locally advanced prostate cancer with questionable lymph node involvement in the pelvis and retroperitoneum. Lymph node biopsy was benign and he elected to undergo surgical therapy with a pelvic lymphadenectomy and prostatectomy if his lymph node tissue was negative.   Hospital Course: He was taken to the OR on 01/05/13 and underwent a left pelvic lymphadenectomy with robotic assisted laparoscopic surgery. He was found to have a large, firm, matted left external iliac lymph node which was positive on frozen section analysis. We therefore did not proceed with a radical prostatectomy.  He was observed overnight and has recovered uneventfully.  Laboratory values: No results found for this basename: HGB, HCT,  in the last 72 hours No results found for this basename: CREATININE,  in the last 72 hours  Disposition: Home  Discharge instruction: The patient was instructed to be ambulatory but told to refrain from heavy lifting, strenuous activity, or driving.   Discharge medications:    Medication List    TAKE these medications       albuterol 108 (90 BASE) MCG/ACT inhaler  Commonly known as:  PROVENTIL HFA;VENTOLIN HFA  Inhale 2 puffs into the lungs every 6 (six) hours as needed for wheezing.     amLODipine 10 MG tablet  Commonly known as:  NORVASC  Take 10 mg by mouth every morning.     azelastine 137 MCG/SPRAY nasal spray  Commonly known as:  ASTELIN  Place 2 sprays into the nose 2 (two) times daily. Use in each nostril as directed     budesonide-formoterol 160-4.5 MCG/ACT inhaler  Commonly known as:  SYMBICORT  Inhale 2 puffs into the lungs. Take 2 puffs first thing in am and then another 2 puffs about 12  hours later.     HYDROcodone-acetaminophen 5-325 MG per tablet  Commonly known as:  NORCO  Take 1-2 tablets by mouth every 6 (six) hours as needed for pain.     mometasone 50 MCG/ACT nasal spray  Commonly known as:  NASONEX  Place 2 sprays into the nose daily. 1 spray in each nostril daily     valsartan-hydrochlorothiazide 160-12.5 MG per tablet  Commonly known as:  DIOVAN-HCT  Take 1 tablet by mouth every morning.        Followup:      Follow-up Information   Follow up with Jinx Gilden,LES, MD On 01/12/2013. (at 2:45)    Contact information:   [address] [place] [address] [phone]

## 2013-01-06 NOTE — Progress Notes (Signed)
Patient ID: [name] Holder, male   DOB: Apr 02, 1956, 57 y.o.   MRN: [id number]  1 Day Post-Op Subjective: Pt without complaints after PLND yesterday.  Pain well controlled.  Voiding well.  Objective: Vital signs in last 24 hours: Temp:  [97.6 F (36.4 C)-98.9 F (37.2 C)] 98.8 F (37.1 C) (06/13 0528) Pulse Rate:  [70-98] 70 (06/13 0528) Resp:  [14-20] 16 (06/13 0528) BP: (113-160)/(69-91) 139/81 mmHg (06/13 0528) SpO2:  [95 %-100 %] 99 % (06/13 0528) Weight:  [87.544 kg (193 lb)] 87.544 kg (193 lb) (06/12 1343)  Intake/Output from previous day: 06/12 0701 - 06/13 0700 In: 4357.5 [P.O.:1560; I.V.:2747.5; IV Piggyback:50] Out: 2550 [Urine:2550] Intake/Output this shift:    Physical Exam:  General: Alert and oriented CV: RRR Lungs: Clear Abdomen: Soft, ND Incisions:C/D/I Ext: NT, No erythema  Lab Results: No results found for this basename: HGB, HCT,  in the last 72 hours BMET No results found for this basename: NA, K, CL, CO2, GLUCOSE, BUN, CREATININE, CALCIUM,  in the last 72 hours   Studies/Results: No results found.  Assessment/Plan: - D/C home   LOS: 1 day   [name] Holder,LES 01/06/2013, 8:25 AM

## 2013-01-06 NOTE — Care Management Note (Signed)
    Page 1 of 1   01/06/2013     12:45:20 PM   CARE MANAGEMENT NOTE 01/06/2013  Patient:  [name] Holder, [name] Holder   Account Number:  [id number]  Date Initiated:  01/06/2013  Documentation initiated by:  Lanier Clam  Subjective/Objective Assessment:   ADMITTED W/PROSTATE CA.     Action/Plan:   FROM HOME.   Anticipated DC Date:  01/06/2013   Anticipated DC Plan:  HOME/SELF CARE      DC Planning Services  CM consult      Choice offered to / List presented to:             Status of service:  Completed, signed off Medicare Important Message given?   (If response is "NO", the following Medicare IM given date fields will be blank) Date Medicare IM given:   Date Additional Medicare IM given:    Discharge Disposition:  HOME/SELF CARE  Per UR Regulation:    If discussed at Long Length of Stay Meetings, dates discussed:    Comments:  01/06/13 Emilina Smarr RN,BSN NCM [phone] S/P L PELVIC [email or web address] D/C NEEDS.

## 2013-02-17 ENCOUNTER — Ambulatory Visit (INDEPENDENT_AMBULATORY_CARE_PROVIDER_SITE_OTHER): Payer: 59 | Admitting: Internal Medicine

## 2013-02-17 ENCOUNTER — Encounter: Payer: Self-pay | Admitting: Internal Medicine

## 2013-02-17 VITALS — BP 138/84 | HR 91 | Temp 98.1°F | Wt 191.0 lb

## 2013-02-17 DIAGNOSIS — I10 Essential (primary) hypertension: Secondary | ICD-10-CM

## 2013-02-17 DIAGNOSIS — C61 Malignant neoplasm of prostate: Secondary | ICD-10-CM

## 2013-02-17 DIAGNOSIS — J309 Allergic rhinitis, unspecified: Secondary | ICD-10-CM

## 2013-02-17 NOTE — Progress Notes (Signed)
Subjective:    Patient ID: [name] Holder, male    DOB: July 18, 1956, 57 y.o.   MRN: [id number]  HPI  Pt presents to the clinic today for 6 month f/u of chronic medical conditions. Since his last visit, He did see urology. He has been diagnosed with prostate cancer with lymph node involvement. This is non curable. He is undergoing hormone reduction therapy at this time. Other than that, he has been doing well. No complaints.  HTN: Well controlled on Norvasc and Diovan. Tolerating medications well without side effects. He has lost about 9 lbs. He is working on diet and exercise.  Allergies: well controlled on Nasonex and Astelin. Allergies have not been bad this spring/summer.  Review of Systems      Past Medical History  Diagnosis Date  . Asthma   . Allergy   . Hypertension   . Hx of colonic polyps   . History of kidney stones   . Prostate cancer   . Elevated PSA 10/03/2012    23.37  . History of kidney stones     Current Outpatient Prescriptions  Medication Sig Dispense Refill  . albuterol (PROVENTIL HFA;VENTOLIN HFA) 108 (90 BASE) MCG/ACT inhaler Inhale 2 puffs into the lungs every 6 (six) hours as needed for wheezing.      [name] amLODipine (NORVASC) 10 MG tablet Take 10 mg by mouth every morning.      . Ascorbic Acid (VITAMIN C PO) Take 1 tablet by mouth daily.      [name] azelastine (ASTELIN) 137 MCG/SPRAY nasal spray Place 2 sprays into the nose 2 (two) times daily. Use in each nostril as directed  30 mL  5  . budesonide-formoterol (SYMBICORT) 160-4.5 MCG/ACT inhaler Inhale 2 puffs into the lungs. Take 2 puffs first thing in am and then another 2 puffs about 12 hours later.      [name] CALCIUM-VITAMIN D PO Take 1 tablet by mouth 2 (two) times daily.      . mometasone (NASONEX) 50 MCG/ACT nasal spray Place 2 sprays into the nose daily. 1 spray in each nostril daily  17 g  5  . Multiple Vitamin (MULTIVITAMIN) tablet Take 1 tablet by mouth daily.      . valsartan-hydrochlorothiazide  (DIOVAN-HCT) 160-12.5 MG per tablet Take 1 tablet by mouth every morning.      [name] VITAMIN E PO Take 1 tablet by mouth daily.      [name] HYDROcodone-acetaminophen (NORCO) 5-325 MG per tablet Take 1-2 tablets by mouth every 6 (six) hours as needed for pain.  30 tablet  0   No current facility-administered medications for this visit.    Allergies  Allergen Reactions  . Tetracycline Rash    Family History  Problem Relation Age of Onset  . Arthritis Mother   . Diabetes Mother   . Hypertension Mother   . Hyperlipidemia Mother   . Hypertension Father   . Cancer Father     Prostate Cancer/radiation mid 63's  . Stroke Maternal Grandfather   . Cancer Sister     precancerous colon polyps, 6 in colon removed    History   Social History  . Marital Status: Married    Spouse Name: N/A    Number of Children: N/A  . Years of Education: 16   Occupational History  . Quality Manager [employer]   Social History Main Topics  . Smoking status: Light Tobacco Smoker -- 0.25 packs/day for 15 years    Types: Cigarettes  . Smokeless  tobacco: Never Used  . Alcohol Use: 1.2 oz/week    2 Glasses of wine per week     Comment: wine  1  glasses daily  . Drug Use: No  . Sexually Active: Yes   Other Topics Concern  . Not on file   Social History Narrative   Regular exercise-yes   Caffeine Use-yes     Constitutional: Denies fever, malaise, fatigue, headache or abrupt weight changes.  HEENT: Denies eye pain, eye redness, ear pain, ringing in the ears, wax buildup, runny nose, nasal congestion, bloody nose, or sore throat. Respiratory: Denies difficulty breathing, shortness of breath, cough or sputum production.   Cardiovascular: Denies chest pain, chest tightness, palpitations or swelling in the hands or feet.   Neurological: Denies dizziness, difficulty with memory, difficulty with speech or problems with balance and coordination.   No other specific complaints in a complete review of  systems (except as listed in HPI above).  Objective:   Physical Exam  BP 138/84  Pulse 91  Temp(Src) 98.1 F (36.7 C) (Oral)  Wt 191 lb (86.637 kg)  BMI 28.19 kg/m2  SpO2 97% Wt Readings from Last 3 Encounters:  02/17/13 191 lb (86.637 kg)  01/05/13 193 lb (87.544 kg)  01/05/13 193 lb (87.544 kg)    General: Appears his stated age, well developed, well nourished in NAD. HEENT: Head: normal shape and size; Eyes: sclera white, no icterus, conjunctiva pink, PERRLA and EOMs intact; Ears: Tm's gray and intact, normal light reflex; Nose: mucosa pink and moist, septum midline; Throat/Mouth: Teeth present, mucosa pink and moist, no exudate, lesions or ulcerations noted.  Neck: Normal range of motion. Neck supple, trachea midline. No massses, lumps or thyromegaly present.  Cardiovascular: Normal rate and rhythm. S1,S2 noted.  No murmur, rubs or gallops noted. No JVD or BLE edema. No carotid bruits noted. Pulmonary/Chest: Normal effort and positive vesicular breath sounds. No respiratory distress. No wheezes, rales or ronchi noted.  Neurological: Alert and oriented. Cranial nerves II-XII intact. Coordination normal. +DTRs bilaterally.   BMET    Component Value Date/Time   NA 133* 12/29/2012 1400   K 4.0 12/29/2012 1400   CL 95* 12/29/2012 1400   CO2 28 12/29/2012 1400   GLUCOSE 95 12/29/2012 1400   BUN 11 12/29/2012 1400   CREATININE 0.78 12/29/2012 1400   CALCIUM 10.0 12/29/2012 1400   GFRNONAA >90 12/29/2012 1400   GFRAA >90 12/29/2012 1400    Lipid Panel     Component Value Date/Time   CHOL 185 08/26/2012 0950   TRIG 68.0 08/26/2012 0950   HDL 65.80 08/26/2012 0950   CHOLHDL 3 08/26/2012 0950   VLDL 13.6 08/26/2012 0950   LDLCALC 106* 08/26/2012 0950    CBC    Component Value Date/Time   WBC 7.6 12/29/2012 1400   RBC 4.78 12/29/2012 1400   HGB 16.3 12/29/2012 1400   HCT 44.7 12/29/2012 1400   PLT 241 12/29/2012 1400   MCV 93.5 12/29/2012 1400   MCH 34.1* 12/29/2012 1400   MCHC 36.5* 12/29/2012 1400    RDW 12.2 12/29/2012 1400    Hgb A1C Lab Results  Component Value Date   HGBA1C 5.5 08/26/2012         Assessment & Plan:

## 2013-02-17 NOTE — Assessment & Plan Note (Signed)
Well controlled Continue current meds-refilled today

## 2013-02-17 NOTE — Patient Instructions (Signed)
DASH Diet  The DASH diet stands for "Dietary Approaches to Stop Hypertension." It is a healthy eating plan that has been shown to reduce high blood pressure (hypertension) in as little as 14 days, while also possibly providing other significant health benefits. These other health benefits include reducing the risk of breast cancer after menopause and reducing the risk of type 2 diabetes, heart disease, colon cancer, and stroke. Health benefits also include weight loss and slowing kidney failure in patients with chronic kidney disease.   DIET GUIDELINES  · Limit salt (sodium). Your diet should contain less than 1500 mg of sodium daily.  · Limit refined or processed carbohydrates. Your diet should include mostly whole grains. Desserts and added sugars should be used sparingly.  · Include small amounts of heart-healthy fats. These types of fats include nuts, oils, and tub margarine. Limit saturated and trans fats. These fats have been shown to be harmful in the body.  CHOOSING FOODS   The following food groups are based on a 2000 calorie diet. See your Registered Dietitian for individual calorie needs.  Grains and Grain Products (6 to 8 servings daily)  · Eat More Often: Whole-wheat bread, brown rice, whole-grain or wheat pasta, quinoa, popcorn without added fat or salt (air popped).  · Eat Less Often: White bread, white pasta, white rice, cornbread.  Vegetables (4 to 5 servings daily)  · Eat More Often: Fresh, frozen, and canned vegetables. Vegetables may be raw, steamed, roasted, or grilled with a minimal amount of fat.  · Eat Less Often/Avoid: Creamed or fried vegetables. Vegetables in a cheese sauce.  Fruit (4 to 5 servings daily)  · Eat More Often: All fresh, canned (in natural juice), or frozen fruits. Dried fruits without added sugar. One hundred percent fruit juice (½ cup [237 mL] daily).  · Eat Less Often: Dried fruits with added sugar. Canned fruit in light or heavy syrup.  Lean Meats, Fish, and Poultry (2  servings or less daily. One serving is 3 to 4 oz [85-114 g]).  · Eat More Often: Ninety percent or leaner ground beef, tenderloin, sirloin. Round cuts of beef, chicken breast, turkey breast. All fish. Grill, bake, or broil your meat. Nothing should be fried.  · Eat Less Often/Avoid: Fatty cuts of meat, turkey, or chicken leg, thigh, or wing. Fried cuts of meat or fish.  Dairy (2 to 3 servings)  · Eat More Often: Low-fat or fat-free milk, low-fat plain or light yogurt, reduced-fat or part-skim cheese.  · Eat Less Often/Avoid: Milk (whole, 2%). Whole milk yogurt. Full-fat cheeses.  Nuts, Seeds, and Legumes (4 to 5 servings per week)  · Eat More Often: All without added salt.  · Eat Less Often/Avoid: Salted nuts and seeds, canned beans with added salt.  Fats and Sweets (limited)  · Eat More Often: Vegetable oils, tub margarines without trans fats, sugar-free gelatin. Mayonnaise and salad dressings.  · Eat Less Often/Avoid: Coconut oils, palm oils, butter, stick margarine, cream, half and half, cookies, candy, pie.  FOR MORE INFORMATION  The Dash Diet Eating Plan: www.dashdiet.org  Document Released: 07/02/2011 Document Revised: 10/05/2011 Document Reviewed: 07/02/2011  Exit[employer]® Patient Information ©2014 [employer].

## 2013-02-17 NOTE — Assessment & Plan Note (Signed)
Well controlled Continue to work on diet and exercise Continue current meds- refilled today

## 2013-02-17 NOTE — Assessment & Plan Note (Signed)
Continue to follow with urology for HRT

## 2013-02-22 ENCOUNTER — Telehealth: Payer: Self-pay | Admitting: Internal Medicine

## 2013-02-22 DIAGNOSIS — Z9109 Other allergy status, other than to drugs and biological substances: Secondary | ICD-10-CM

## 2013-02-22 MED ORDER — AZELASTINE HCL 0.1 % NA SOLN: 2.0000 | Freq: Two times a day (BID) | NASAL | Status: DC

## 2013-02-22 MED ORDER — VALSARTAN-HYDROCHLOROTHIAZIDE 160-12.5 MG PO TABS: 1.0000 | ORAL_TABLET | Freq: Every morning | ORAL | Status: DC

## 2013-02-22 MED ORDER — MOMETASONE FUROATE 50 MCG/ACT NA SUSP: 2.0000 | Freq: Every day | NASAL | Status: DC

## 2013-02-22 MED ORDER — AMLODIPINE BESYLATE 10 MG PO TABS: 10.0000 mg | ORAL_TABLET | Freq: Every morning | ORAL | Status: DC

## 2013-02-22 NOTE — Telephone Encounter (Signed)
Pt is calling for refills on generic Norvasc, Diovan HCT, Astelin, and Nasonex  To [facility] [phone].

## 2013-02-22 NOTE — Telephone Encounter (Signed)
Ok to refill 

## 2013-02-22 NOTE — Telephone Encounter (Signed)
Refill done.  

## 2013-03-23 ENCOUNTER — Encounter: Payer: Self-pay | Admitting: Internal Medicine

## 2013-03-23 ENCOUNTER — Ambulatory Visit (INDEPENDENT_AMBULATORY_CARE_PROVIDER_SITE_OTHER): Payer: 59 | Admitting: Internal Medicine

## 2013-03-23 VITALS — BP 128/78 | HR 88 | Temp 100.0°F | Ht 69.0 in | Wt 200.0 lb

## 2013-03-23 DIAGNOSIS — J45909 Unspecified asthma, uncomplicated: Secondary | ICD-10-CM

## 2013-03-23 DIAGNOSIS — J309 Allergic rhinitis, unspecified: Secondary | ICD-10-CM

## 2013-03-23 NOTE — Assessment & Plan Note (Signed)
Poorly controlled > try dymista one bid and prn zyrtec

## 2013-03-23 NOTE — Progress Notes (Signed)
Subjective:     Patient ID: [name] Holder, male   DOB: [birth year]    MRN: [id number]    Brief patient profile:  29 yowm quit smoking 2001 with dx of asthma as child  never totally outgrew.   May 20, 2010 1st pulmonary office eval maintained on advair cc subjective wheeze sob maybe once a week ventolin, things that trigger cold weath, really humid weather, rare flares with viral illness or colds. Always a bit sob with ex. Needing prednsione maybe once a year even if doubles the dose of advair right at onset. Usually requires 10 days of high dose rx to control it. no noct flares.  rec Change to symbicort 80 bid    07/18/2011 f/u ov/[name] Holder presents for med refills. c/o having bronchitis x 1 month. Also, sob, wheezing, cough, and sinus congestion rec Work on inhaler technique:    Ok to use the ventolin if wait 15 minutes after symbicort to let it work but the goal is less than 2 puffs of ventolin twice weekly Try prilosec 20mg   Take 30-60 min before first meal of the day and Pepcid 20 mg one bedtime until cough is completely gone for at least a week without the need for cough suppression GERD diet     08/18/2012 f/u ov/[name] Holder cc doe with hot humid weather otherwise doing ok on avg hfa saba once daily Much worse when not on symbicort 160 2bid. rec Flare of bronchitis > Try prilosec 20mg   Take 30-60 min before first meal of the day and Pepcid 20 mg one bedtime until cough is completely gone for at least a week without the need for cough suppression Please schedule a follow up visit in 6 months but call sooner if needed with pft's and cxr    03/23/2013 f/u ov/[name] Holder re asthma f/u  Chief Complaint  Patient presents with  . Follow-up    Pt states that his breathing is doing well. He c/o having fall allergy symptoms- PND, watery eyes and non prod cough.    no sob, no need for saba  No obvious daytime variabilty or assoc   cp or chest tightness, subjective wheeze overt sinus or hb  symptoms. No unusual exp hx or h/o childhood pna/ asthma or knowledge of premature birth.   Sleeping ok without nocturnal  or early am exacerbation  of respiratory  c/o's or need for noct saba. Also denies any obvious fluctuation of symptoms with weather or environmental changes or other aggravating or alleviating factors except as outlined above   Current Medications, Allergies,Complete Past Medical History, Past Surgical History, Family History, and Social History were reviewed in [employer] record.  ROS  The following are not active complaints unless bolded sore throat, dysphagia, dental problems, itching, sneezing,  nasal congestion or excess/ purulent secretions, ear ache,   fever, chills, sweats, unintended wt loss, pleuritic or exertional cp, hemoptysis,  orthopnea pnd or leg swelling, presyncope, palpitations, heartburn, abdominal pain, anorexia, nausea, vomiting, diarrhea  or change in bowel or urinary habits, change in stools or urine, dysuria,hematuria,  rash, arthralgias, visual complaints, headache, numbness weakness or ataxia or problems with walking or coordination,  change in mood/affect or memory.           Past Medical History:  Allergic Rhinitis  Asthma  - HFA 75% p coaching May 20, 2010  Hypertension         Objective:   Physical Exam  03/23/2013  200 Wt Readings from Last 3 Encounters:  08/18/12 197 lb 3.2 oz (89.449 kg)  07/17/11 199 lb 12.8 oz (90.629 kg)  05/20/10 201 lb (91.173 kg)    Additional Exam: Pleasant amb wm nad.  HEENT: nl dentition,  and orophanx. Mod non specific turbinate edema. Nl external ear canals without cough reflex  NECK : without JVD/Nodes/TM/ nl carotid upstrokes bilaterally  LUNGS: no acc muscle use, clear to A and P bilaterally without cough on insp or exp maneuvers  CV: RRR no s3 or murmur or increase in P2, no edema  ABD: soft and nontender with nl excursion in the supine position. No bruits or  organomegaly, bowel sounds nl  MS: warm without deformities, calf tenderness, cyanosis or clubbing  SKIN: warm and dry without lesions  NEURO: alert, approp, no deficits     12/29/12 Stable bronchitic change and lingular scarring. No new worrisome  focal or acute abnormality seen   Assessment:

## 2013-03-23 NOTE — Patient Instructions (Addendum)
Try nasonex in am and astelin consistently and add zyrtec 10 mg at bedtime if needed  If not efffective dymista one twice daily in place of the nasal sprays  Please schedule a follow up visit in 3 months but call sooner if needed with PFT's on return  Late add : clarify smoking status on return, variably reported to have quit.

## 2013-03-23 NOTE — Assessment & Plan Note (Addendum)
On symbicort 160 2bid  >> All goals of chronic asthma control met including optimal function and elimination of symptoms with minimal need for rescue therapy.  Contingencies discussed in full including contacting this office immediately if not controlling the symptoms using the rule of two's.       Each maintenance medication was reviewed in detail including most importantly the difference between maintenance and as needed and under what circumstances the prns are to be used.  Please see instructions for details which were reviewed in writing and the patient given a copy.    Needs a f/u set of pfts given smoking hx .

## 2013-06-01 ENCOUNTER — Other Ambulatory Visit: Payer: Self-pay

## 2013-06-16 ENCOUNTER — Telehealth: Payer: Self-pay | Admitting: Internal Medicine

## 2013-06-16 MED ORDER — ALBUTEROL SULFATE HFA 108 (90 BASE) MCG/ACT IN AERS: 2.0000 | INHALATION_SPRAY | Freq: Four times a day (QID) | RESPIRATORY_TRACT | Status: DC | PRN

## 2013-06-16 NOTE — Telephone Encounter (Signed)
rx for the proair has been sent to the pharmacy. lmom to make the pt aware.

## 2013-06-28 ENCOUNTER — Other Ambulatory Visit: Payer: Self-pay | Admitting: Internal Medicine

## 2013-07-25 ENCOUNTER — Encounter: Payer: Self-pay | Admitting: Internal Medicine

## 2013-07-25 ENCOUNTER — Ambulatory Visit (INDEPENDENT_AMBULATORY_CARE_PROVIDER_SITE_OTHER): Payer: 59 | Admitting: Internal Medicine

## 2013-07-25 VITALS — BP 118/70 | HR 85 | Temp 98.9°F | Ht 68.5 in | Wt 203.0 lb

## 2013-07-25 DIAGNOSIS — J45909 Unspecified asthma, uncomplicated: Secondary | ICD-10-CM

## 2013-07-25 DIAGNOSIS — R059 Cough, unspecified: Secondary | ICD-10-CM

## 2013-07-25 DIAGNOSIS — R05 Cough: Secondary | ICD-10-CM

## 2013-07-25 DIAGNOSIS — R058 Other specified cough: Secondary | ICD-10-CM | POA: Insufficient documentation

## 2013-07-25 LAB — PULMONARY FUNCTION TEST
DL/VA % pred: 94 %
DL/VA: 4.3 ml/min/mmHg/L
DLCO unc % pred: 109 %
DLCO unc: 33.22 ml/min/mmHg
FEF 25-75 Post: 2.28 L/sec
FEF 25-75 Pre: 2.08 L/sec
FEF2575-%Change-Post: 9 %
FEF2575-%Pred-Post: 79 %
FEF2575-%Pred-Pre: 72 %
FEV1-%Change-Post: 3 %
FEV1-%Pred-Post: 96 %
FEV1-%Pred-Pre: 93 %
FEV1-Post: 3.31 L
FEV1-Pre: 3.21 L
FEV1FVC-%Change-Post: 3 %
FEV1FVC-%Pred-Pre: 91 %
FEV6-%Change-Post: 1 %
FEV6-%Pred-Post: 103 %
FEV6-%Pred-Pre: 102 %
FEV6-Post: 4.49 L
FEV6-Pre: 4.43 L
FEV6FVC-%Change-Post: 0 %
FEV6FVC-%Pred-Post: 102 %
FEV6FVC-%Pred-Pre: 102 %
FVC-%Change-Post: 0 %
FVC-%Pred-Post: 101 %
FVC-%Pred-Pre: 101 %
FVC-Post: 4.61 L
FVC-Pre: 4.6 L
Post FEV1/FVC ratio: 72 %
Post FEV6/FVC ratio: 97 %
Pre FEV1/FVC ratio: 70 %
Pre FEV6/FVC Ratio: 97 %
RV % pred: 73 %
RV: 1.58 L
TLC % pred: 94 %
TLC: 6.29 L

## 2013-07-25 MED ORDER — MOMETASONE FURO-FORMOTEROL FUM 200-5 MCG/ACT IN AERO: INHALATION_SPRAY | RESPIRATORY_TRACT | Status: DC

## 2013-07-25 NOTE — Assessment & Plan Note (Signed)
-   HFA 100% 08/18/2012  - PFT's 07/25/2013  wnl   All goals of chronic asthma control met including optimal function and elimination of symptoms with minimal need for rescue therapy.  Contingencies discussed in full including contacting this office immediately if not controlling the symptoms using the rule of two's.

## 2013-07-25 NOTE — Patient Instructions (Addendum)
Try prilosec 20mg   Take 30-60 min before first meal of the day and Pepcid 20 mg one bedtime until cough is completely gone for at least a week without the need for cough suppression  I think of reflux for chronic cough like I do oxygen for fire (doesn't cause the fire but once you get the oxygen suppressed it usually goes away regardless of the exact cause).  GERD (REFLUX)  is an extremely common cause of respiratory symptoms, many times with no significant heartburn at all.    It can be treated with medication, but also with lifestyle changes including avoidance of late meals, excessive alcohol, smoking cessation, and avoid fatty foods, chocolate, peppermint, colas, red wine, and acidic juices such as orange juice.  NO MINT OR MENTHOL PRODUCTS SO NO COUGH DROPS  USE SUGARLESS CANDY INSTEAD (jolley ranchers or Stover's)  NO OIL BASED VITAMINS - use powdered substitutes.  Be sure to take the dulera 200(in place of symbicort) Take 2 puffs first thing in am and then another 2 puffs about 12 hours later.   For cough > mucinex dm  Up 1200 mg every 12 hours as needed   If you are satisfied with your treatment plan let your doctor know and he/she can either refill your medications or you can return here when your prescription runs out.     If in any way you are not 100% satisfied,  please tell us.  If 100% better, tell your friends!   [name]

## 2013-07-25 NOTE — Progress Notes (Signed)
Subjective:     Patient ID: [name] Holder, male   DOB: February 22, 1956    MRN: [id number]    Brief patient profile:  18 yowm quit smoking 2001 with dx of asthma as child  never totally outgrew with pft's wnl 07/25/2013     History of Present Illness  May 20, 2010 1st pulmonary office eval maintained on advair cc subjective wheeze sob maybe once a week ventolin, things that trigger cold weath, really humid weather, rare flares with viral illness or colds. Always a bit sob with ex. Needing prednsione maybe once a year even if doubles the dose of advair right at onset. Usually requires 10 days of high dose rx to control it. no noct flares.  rec Change to symbicort 80 bid    07/18/2011 f/u ov/Wert presents for med refills. c/o having bronchitis x 1 month. Also, sob, wheezing, cough, and sinus congestion rec Work on inhaler technique:    Ok to use the ventolin if wait 15 minutes after symbicort to let it work but the goal is less than 2 puffs of ventolin twice weekly Try prilosec 20mg   Take 30-60 min before first meal of the day and Pepcid 20 mg one bedtime until cough is completely gone for at least a week without the need for cough suppression GERD diet     08/18/2012 f/u ov/Wert cc doe with hot humid weather otherwise doing ok on avg hfa saba once daily Much worse when not on symbicort 160 2bid. rec Flare of bronchitis > Try prilosec 20mg   Take 30-60 min before first meal of the day and Pepcid 20 mg one bedtime until cough is completely gone for at least a week without the need for cough suppression Please schedule a follow up visit in 6 months but call sooner if needed with pft's and cxr    03/23/2013 f/u ov/Wert re asthma f/u  Chief Complaint  Patient presents with  . Follow-up    Pt states that his breathing is doing well. He c/o having fall allergy symptoms- PND, watery eyes and non prod cough.    no sob, no need for saba rec Nasonex in am and astelin consistently and add  zyrtec 10 mg at bedtime if needed If not efffective dymista one twice daily in place of the nasal sprays Please schedule a follow up visit in 3 months but call sooner if needed with PFT's on return  Late add : clarify smoking status on return, variably reported to have quit.   07/25/2013 f/u ov/Wert re: asthma/cr Chief Complaint  Patient presents with  . Followup with PFT    Pt states had a chest cold in early Nov 2014 and since then has been bothered with cough and "tickle in throat". Cough is sometimes prod with minimal brown sputum. He is using rescue inhaler approx 4 times per wk.   not using symbicort 1st thing in am, that's when he needs the rescue No noct cough, bothers his wife more than it does him,  Not better with robitussin so far gerd rx helped cough in past but no overt HB  No obvious daytime variabilty or assoc   cp or chest tightness, subjective wheeze overt sinus or hb symptoms. No unusual exp hx or h/o childhood pna/ asthma or knowledge of premature birth.   Sleeping ok without nocturnal  or early am exacerbation  of respiratory  c/o's or need for noct saba. Also denies any obvious fluctuation of symptoms with weather or environmental changes  or other aggravating or alleviating factors except as outlined above   Current Medications, Allergies,Complete Past Medical History, Past Surgical History, Family History, and Social History were reviewed in [employer] record.  ROS  The following are not active complaints unless bolded sore throat, dysphagia, dental problems, itching, sneezing,  nasal congestion or excess/ purulent secretions, ear ache,   fever, chills, sweats, unintended wt loss, pleuritic or exertional cp, hemoptysis,  orthopnea pnd or leg swelling, presyncope, palpitations, heartburn, abdominal pain, anorexia, nausea, vomiting, diarrhea  or change in bowel or urinary habits, change in stools or urine, dysuria,hematuria,  rash, arthralgias, visual  complaints, headache, numbness weakness or ataxia or problems with walking or coordination,  change in mood/affect or memory.           Past Medical History:  Allergic Rhinitis  Asthma  - HFA 75% p coaching May 20, 2010  Hypertension         Objective:   Physical Exam  03/23/2013        200 > 07/25/2013 203  Wt Readings from Last 3 Encounters:  08/18/12 197 lb 3.2 oz (89.449 kg)  07/17/11 199 lb 12.8 oz (90.629 kg)  05/20/10 201 lb (91.173 kg)    Additional Exam: Pleasant amb wm nad.  HEENT: nl dentition,  and orophanx. Mod non specific turbinate edema. Nl external ear canals without cough reflex  NECK : without JVD/Nodes/TM/ nl carotid upstrokes bilaterally  LUNGS: no acc muscle use, clear to A and P bilaterally without cough on insp or exp maneuvers  CV: RRR no s3 or murmur or increase in P2, no edema  ABD: soft and nontender with nl excursion in the supine position. No bruits or organomegaly, bowel sounds nl  MS: warm without deformities, calf tenderness, cyanosis or clubbing  SKIN: warm and dry without lesions  NEURO: alert, approp, no deficits     12/29/12 Stable bronchitic change and lingular scarring. No new worrisome  focal or acute abnormality seen   Assessment:

## 2013-07-25 NOTE — Progress Notes (Signed)
PFT done today. 

## 2013-07-25 NOTE — Assessment & Plan Note (Signed)
Already being treated for rhinitis and asthma so most likely this is gerd related  rec rx with diet/ otc's and f/u with sinus ct if not resolved

## 2013-09-05 ENCOUNTER — Ambulatory Visit (INDEPENDENT_AMBULATORY_CARE_PROVIDER_SITE_OTHER): Payer: 59 | Admitting: Physician Assistant

## 2013-09-05 ENCOUNTER — Encounter: Payer: Self-pay | Admitting: Physician Assistant

## 2013-09-05 ENCOUNTER — Other Ambulatory Visit (INDEPENDENT_AMBULATORY_CARE_PROVIDER_SITE_OTHER): Payer: 59

## 2013-09-05 VITALS — BP 132/82 | HR 72 | Temp 99.3°F | Resp 16 | Ht 69.0 in | Wt 204.0 lb

## 2013-09-05 DIAGNOSIS — Z9109 Other allergy status, other than to drugs and biological substances: Secondary | ICD-10-CM

## 2013-09-05 DIAGNOSIS — Z0000 Encounter for general adult medical examination without abnormal findings: Secondary | ICD-10-CM

## 2013-09-05 LAB — URINALYSIS, ROUTINE W REFLEX MICROSCOPIC
Bilirubin Urine: NEGATIVE
Hgb urine dipstick: NEGATIVE
Ketones, ur: NEGATIVE
Leukocytes, UA: NEGATIVE
Nitrite: NEGATIVE
Specific Gravity, Urine: 1.015 (ref 1.000–1.030)
Total Protein, Urine: NEGATIVE
Urine Glucose: NEGATIVE
Urobilinogen, UA: 0.2 (ref 0.0–1.0)
WBC, UA: NONE SEEN — AB (ref 0–?)
pH: 8 (ref 5.0–8.0)

## 2013-09-05 LAB — HEPATIC FUNCTION PANEL
ALT: 31 U/L (ref 0–53)
AST: 24 U/L (ref 0–37)
Albumin: 4.4 g/dL (ref 3.5–5.2)
Alkaline Phosphatase: 52 U/L (ref 39–117)
Bilirubin, Direct: 0 mg/dL (ref 0.0–0.3)
Total Bilirubin: 0.8 mg/dL (ref 0.3–1.2)
Total Protein: 7.2 g/dL (ref 6.0–8.3)

## 2013-09-05 LAB — LIPID PANEL
Cholesterol: 192 mg/dL (ref 0–200)
HDL: 73.3 mg/dL (ref >39.00)
LDL Cholesterol: 111 mg/dL — ABNORMAL HIGH (ref 0–99)
Total CHOL/HDL Ratio: 3
Triglycerides: 41 mg/dL (ref 0.0–149.0)
VLDL: 8.2 mg/dL (ref 0.0–40.0)

## 2013-09-05 LAB — CBC WITH DIFFERENTIAL/PLATELET
Basophils Absolute: 0 10*3/uL (ref 0.0–0.1)
Basophils Relative: 0.6 % (ref 0.0–3.0)
Eosinophils Absolute: 0.1 10*3/uL (ref 0.0–0.7)
Eosinophils Relative: 1.8 % (ref 0.0–5.0)
HCT: 44.3 % (ref 39.0–52.0)
Hemoglobin: 15.1 g/dL (ref 13.0–17.0)
Lymphocytes Relative: 27.6 % (ref 12.0–46.0)
Lymphs Abs: 2.1 10*3/uL (ref 0.7–4.0)
MCHC: 34.2 g/dL (ref 30.0–36.0)
MCV: 97.7 fl (ref 78.0–100.0)
Monocytes Absolute: 0.8 10*3/uL (ref 0.1–1.0)
Monocytes Relative: 10.9 % (ref 3.0–12.0)
Neutro Abs: 4.4 10*3/uL (ref 1.4–7.7)
Neutrophils Relative %: 59.1 % (ref 43.0–77.0)
Platelets: 267 10*3/uL (ref 150.0–400.0)
RBC: 4.53 Mil/uL (ref 4.22–5.81)
RDW: 12.6 % (ref 11.5–14.6)
WBC: 7.5 10*3/uL (ref 4.5–10.5)

## 2013-09-05 LAB — BASIC METABOLIC PANEL
BUN: 12 mg/dL (ref 6–23)
CO2: 27 mEq/L (ref 19–32)
Calcium: 9.7 mg/dL (ref 8.4–10.5)
Chloride: 100 mEq/L (ref 96–112)
Creatinine, Ser: 0.8 mg/dL (ref 0.4–1.5)
GFR: 106.47 mL/min (ref >60.00)
Glucose, Bld: 109 mg/dL — ABNORMAL HIGH (ref 70–99)
Potassium: 4.4 mEq/L (ref 3.5–5.1)
Sodium: 136 mEq/L (ref 135–145)

## 2013-09-05 LAB — TSH: TSH: 0.66 u[IU]/mL (ref 0.35–5.50)

## 2013-09-05 MED ORDER — MOMETASONE FUROATE 50 MCG/ACT NA SUSP: 2.0000 | Freq: Every day | NASAL | Status: DC

## 2013-09-05 MED ORDER — VALSARTAN-HYDROCHLOROTHIAZIDE 160-12.5 MG PO TABS: ORAL_TABLET | ORAL | Status: DC

## 2013-09-05 MED ORDER — AMLODIPINE BESYLATE 10 MG PO TABS: ORAL_TABLET | ORAL | Status: DC

## 2013-09-05 MED ORDER — ALBUTEROL SULFATE HFA 108 (90 BASE) MCG/ACT IN AERS: 2.0000 | INHALATION_SPRAY | Freq: Four times a day (QID) | RESPIRATORY_TRACT | Status: DC | PRN

## 2013-09-05 MED ORDER — AZELASTINE HCL 0.1 % NA SOLN: 2.0000 | Freq: Two times a day (BID) | NASAL | Status: DC

## 2013-09-05 NOTE — Patient Instructions (Signed)
It was great meeting you today [name]!   Labs have been ordered for you, when you report to lab please be fasting.    Health Maintenance, Males A healthy lifestyle and preventative care can promote health and wellness.  Maintain regular health, dental, and eye exams.  Eat a healthy diet. Foods like vegetables, fruits, whole grains, low-fat dairy products, and lean protein foods contain the nutrients you need and are low in calories. Decrease your intake of foods high in solid fats, added sugars, and salt. Get information about a proper diet from your health care provider, if necessary.  Regular physical exercise is one of the most important things you can do for your health. Most adults should get at least 150 minutes of moderate-intensity exercise (any activity that increases your heart rate and causes you to sweat) each week. In addition, most adults need muscle-strengthening exercises on 2 or more days a week.   Maintain a healthy weight. The body mass index (BMI) is a screening tool to identify possible weight problems. It provides an estimate of body fat based on height and weight. Your health care provider can find your BMI and can help you achieve or maintain a healthy weight. For males 20 years and older:  A BMI below 18.5 is considered underweight.  A BMI of 18.5 to 24.9 is normal.  A BMI of 25 to 29.9 is considered overweight.  A BMI of 30 and above is considered obese.  Maintain normal blood lipids and cholesterol by exercising and minimizing your intake of saturated fat. Eat a balanced diet with plenty of fruits and vegetables. Blood tests for lipids and cholesterol should begin at age 26 and be repeated every 5 years. If your lipid or cholesterol levels are high, you are over 50, or you are at high risk for heart disease, you may need your cholesterol levels checked more frequently.Ongoing high lipid and cholesterol levels should be treated with medicines, if diet and  exercise are not working.  If you smoke, find out from your health care provider how to quit. If you do not use tobacco, do not start.  Lung cancer screening is recommended for adults aged 65 80 years who are at high risk for developing lung cancer because of a history of smoking. A yearly low-dose CT scan of the lungs is recommended for people who have at least a 30-pack-year history of smoking and are a current smoker or have quit within the past 15 years. A pack year of smoking is smoking an average of 1 pack of cigarettes a day for 1 year (for example, a 30-pack-year history of smoking could mean smoking 1 pack a day for 30 years or 2 packs a day for 15 years). Yearly screening should continue until the smoker has stopped smoking for at least 15 years. Yearly screening should be stopped for people who develop a health problem that would prevent them from having lung cancer treatment.  If you choose to drink alcohol, do not have more than 2 drinks per day. One drink is considered to be 12 oz (360 mL) of beer, 5 oz (150 mL) of wine, or 1.5 oz (45 mL) of liquor.  Avoid use of street drugs. Do not share needles with anyone. Ask for help if you need support or instructions about stopping the use of drugs.  High blood pressure causes heart disease and increases the risk of stroke. Blood pressure should be checked at least every 1 2 years.  Ongoing high blood pressure should be treated with medicines if weight loss and exercise are not effective.  If you are 72 60 years old, ask your health care provider if you should take aspirin to prevent heart disease.  Diabetes screening involves taking a blood sample to check your fasting blood sugar level. This should be done once every 3 years after age 39, if you are at a normal weight and without risk factors for diabetes. Testing should be considered at a younger age or be carried out more frequently if you are overweight and have at least 1 risk factor for  diabetes.  Colorectal cancer can be detected and often prevented. Most routine colorectal cancer screening begins at the age of 64 and continues through age 77. However, your health care provider may recommend screening at an earlier age if you have risk factors for colon cancer. On a yearly basis, your health care provider may provide home test kits to check for hidden blood in the stool. A small camera at the end of a tube may be used to directly examine the colon (sigmoidoscopy or colonoscopy) to detect the earliest forms of colorectal cancer. Talk to your health care provider about this at age 76, when routine screening begins. A direct exam of the colon should be repeated every 5 10 years through age 16, unless early forms of pre-cancerous polyps or small growths are found.  People who are at an increased risk for hepatitis B should be screened for this virus. You are considered at high risk for hepatitis B if:  You were born in a country where hepatitis B occurs often. Talk with your health care provider about which countries are considered high-risk.  Your parents were born in a high-risk country and you have not received a shot to protect against hepatitis B (hepatitis B vaccine).  You have HIV or AIDS.  You use needles to inject street drugs.  You live with, or have sex with, someone who has hepatitis B.  You are a man who has sex with other men (MSM).  You get hemodialysis treatment.  You take certain medicines for conditions like cancer, organ transplantation, and autoimmune conditions.  Hepatitis C blood testing is recommended for all people born from [birth year] through 1967 and any individual with known risk factors for hepatitis C.  Healthy men should no longer receive prostate-specific antigen (PSA) blood tests as part of routine cancer screening. Talk to your health care provider about prostate cancer screening.  Testicular cancer screening is not recommended for adolescents or  adult males who have no symptoms. Screening includes self-exam, a health care provider exam, and other screening tests. Consult with your health care provider about any symptoms you have or any concerns you have about testicular cancer.  Practice safe sex. Use condoms and avoid high-risk sexual practices to reduce the spread of sexually transmitted infections (STIs).  Use sunscreen. Apply sunscreen liberally and repeatedly throughout the day. You should seek shade when your shadow is shorter than you. Protect yourself by wearing long sleeves, pants, a wide-brimmed hat, and sunglasses year round, whenever you are outdoors.  Tell your health care provider of new moles or changes in moles, especially if there is a change in shape or color. Also tell your provider if a mole is larger than the size of a pencil eraser.  A one-time screening for abdominal aortic aneurysm (AAA) and surgical repair of large AAAs by ultrasound is recommended for men aged 63 75 years  who are current or former smokers.  Stay current with your vaccines (immunizations). Document Released: 01/09/2008 Document Revised: 05/03/2013 Document Reviewed: 12/08/2010 [facility] Patient Information 2014 [place], [place].

## 2013-09-05 NOTE — Progress Notes (Signed)
Pre visit review using our clinic review tool, if applicable. No additional management support is needed unless otherwise documented below in the visit note. 

## 2013-09-05 NOTE — Progress Notes (Signed)
Patient ID: [name] Holder is a 58 y.o. male DOB: [phone] MRN: [id number]     HPI: Patient is a 58 year old male who present to the office for his annual exam. Patient reports diagnosis of Prostate Cancer 6/14. Follows with [facility], next appointment in two weeks. Patient states blood pressure not normally as elevated as it was this morning, has it monitored at work weekly, normally 120/70-75. HTN and asthma well controlled with current medications. Requires refills of current medications. Denies other concerns at this time. Denies chest pain/palpitations, visual change/disturbances, HA, light headed/dizzy, change in bowel/bladder habits, pain/difficulty swallowing, fever, SOB or extremity swelling.    Influenza: 10/14 Pneumonia:6/14 Tetanus:1/14 Tdap Colonoscopy: 6/14, return in 5.  ROS: As stated in HPI. All other systems negative  Past Medical History  Diagnosis Date  . Asthma   . Allergy   . Hypertension   . Hx of colonic polyps   . History of kidney stones   . Prostate cancer   . Elevated PSA 10/03/2012    23.37  . History of kidney stones    Family History  Problem Relation Age of Onset  . Arthritis Mother   . Diabetes Mother   . Hypertension Mother   . Hyperlipidemia Mother   . Hypertension Father   . Cancer Father     Prostate Cancer/radiation mid 59's  . Stroke Maternal Grandfather   . Cancer Sister     precancerous colon polyps, 6 in colon removed   History   Social History  . Marital Status: Married    Spouse Name: N/A    Number of Children: N/A  . Years of Education: 16   Occupational History  . Quality Manager [employer]   Social History Main Topics  . Smoking status: Light Tobacco Smoker -- 0.25 packs/day for 15 years    Types: Cigarettes  . Smokeless tobacco: Never Used  . Alcohol Use: 1.2 oz/week    2 Glasses of wine per week     Comment: wine  1  glasses daily  . Drug Use: No  . Sexual Activity: Yes   Other Topics  Concern  . None   Social History Narrative   Regular exercise-yes   Caffeine Use-yes   Past Surgical History  Procedure Laterality Date  . Rotator cuff repair  2012  . Hernia repair  ?2008  . Prostate biopsy      11/12 positive biopsies  . Colonoscopy    . Lymphadenectomy Left 01/05/2013    Procedure: ROBOTIC LYMPHADENECTOMY;  Surgeon: Dutch Gray, MD;  Location: [facility];  Service: Urology;  Laterality: Left;   Current Outpatient Prescriptions on File Prior to Visit  Medication Sig Dispense Refill  . albuterol (PROVENTIL HFA;VENTOLIN HFA) 108 (90 BASE) MCG/ACT inhaler Inhale 2 puffs into the lungs every 6 (six) hours as needed for wheezing.  8.5 g  6  . amLODipine (NORVASC) 10 MG tablet TAKE (1) TABLET DAILY IN THE MORNING.  30 tablet  2  . Ascorbic Acid (VITAMIN C PO) Take 1 tablet by mouth daily.      [name] azelastine (ASTELIN) 137 MCG/SPRAY nasal spray Place 2 sprays into the nose 2 (two) times daily. Use in each nostril as directed  30 mL  3  . CALCIUM-VITAMIN D PO Take 1 tablet by mouth 2 (two) times daily.      . mometasone (NASONEX) 50 MCG/ACT nasal spray Place 2 sprays into the nose daily. 1 spray in each nostril daily  17 g  3  . mometasone-formoterol (DULERA) 200-5 MCG/ACT AERO Take 2 puffs first thing in am and then another 2 puffs about 12 hours later.  1 Inhaler  11  . Multiple Vitamin (MULTIVITAMIN) tablet Take 1 tablet by mouth daily.      . valsartan-hydrochlorothiazide (DIOVAN-HCT) 160-12.5 MG per tablet TAKE (1) TABLET DAILY IN THE MORNING.  30 tablet  2  . VITAMIN E PO Take 1 tablet by mouth daily.       No current facility-administered medications on file prior to visit.   Allergies  Allergen Reactions  . Tetracycline Rash    PE: CONSTITUTIONAL: Well developed, well nourished, pleasant, appears stated age, in NAD HEENT: normocephalic, atraumatic, bilateral ext/int canals normal. Bilateral TM's without injections, bulging, erythema. Nose normal, uvula midline,  oropharynx clear and moist. EYES: PERRLA, bilateral EOM and conjunctiva normal, no icterus. NECK: FROM, supple, without thyromegaly or mass CARDIO: RRR, normal S1 and S2, distal pulses intact. PULM/CHEST CTA bilateral, no wheezes, rales or rhonchi. Non tender. ABD: appearance normal, soft, nontender. Normal bowel sounds x 4 quadrants, non palpable liver, spleen or kidney. GU: deferred to urology. MUSC: FROM U/LE bilateral LYMPH: no cervical, supraclavicular adenopathy NEURO: alert and oriented x 3, no cranial nerve deficit, motor strength and coordination NL. Negative romberg. Gait normal. SKIN: warm, dry, no rash or lesions noted. PSYCH: Mood and affect normal, speech normal.   Lab Results  Component Value Date   WBC 7.6 12/29/2012   HGB 16.3 12/29/2012   HCT 44.7 12/29/2012   PLT 241 12/29/2012   GLUCOSE 95 12/29/2012   CHOL 185 08/26/2012   TRIG 68.0 08/26/2012   HDL 65.80 08/26/2012   LDLCALC 106* 08/26/2012   NA 133* 12/29/2012   K 4.0 12/29/2012   CL 95* 12/29/2012   CREATININE 0.78 12/29/2012   BUN 11 12/29/2012   CO2 28 12/29/2012   PSA 20.21* 08/26/2012   INR 0.88 12/08/2012   HGBA1C 5.5 08/26/2012   ECG reveiwed 6/14 NSR, rate 84 bpm  ASSESSMENT and PLAN   CPX/v70.0 - Patient has been counseled on age-appropriate routine health concerns for screening and prevention. These are reviewed and up-to-date. Immunizations are up-to-date or declined. Labs and ECG reviewed.

## 2013-09-06 ENCOUNTER — Other Ambulatory Visit: Payer: Self-pay | Admitting: Physician Assistant

## 2013-09-06 DIAGNOSIS — E785 Hyperlipidemia, unspecified: Secondary | ICD-10-CM

## 2013-09-06 MED ORDER — ATORVASTATIN CALCIUM 20 MG PO TABS: 20.0000 mg | ORAL_TABLET | Freq: Every day | ORAL | Status: DC

## 2013-09-19 ENCOUNTER — Ambulatory Visit: Payer: 59 | Admitting: Physician Assistant

## 2013-09-20 ENCOUNTER — Encounter (HOSPITAL_COMMUNITY): Payer: 59

## 2013-09-20 ENCOUNTER — Other Ambulatory Visit (HOSPITAL_COMMUNITY): Payer: Self-pay | Admitting: Urology

## 2013-09-20 DIAGNOSIS — R609 Edema, unspecified: Secondary | ICD-10-CM

## 2013-09-21 ENCOUNTER — Encounter (HOSPITAL_COMMUNITY): Payer: 59

## 2013-09-22 ENCOUNTER — Ambulatory Visit (HOSPITAL_COMMUNITY)
Admission: RE | Admit: 2013-09-22 | Discharge: 2013-09-22 | Disposition: A | Discharge status: Home / Self Care | Payer: 59 | Source: Ambulatory Visit | Attending: Urology | Admitting: Urology

## 2013-09-22 DIAGNOSIS — R609 Edema, unspecified: Secondary | ICD-10-CM | POA: Insufficient documentation

## 2013-09-22 NOTE — Progress Notes (Signed)
*  PRELIMINARY RESULTS* Vascular Ultrasound Lower extremity venous duplex has been completed.  Preliminary findings: no evidence of DVT or baker's cyst.    Landry Mellow, RDMS, RVT  09/22/2013, 11:33 AM

## 2013-09-29 ENCOUNTER — Encounter: Payer: Self-pay | Admitting: Internal Medicine

## 2013-09-29 ENCOUNTER — Ambulatory Visit: Payer: 59 | Admitting: Physician Assistant

## 2013-09-29 ENCOUNTER — Ambulatory Visit (INDEPENDENT_AMBULATORY_CARE_PROVIDER_SITE_OTHER): Payer: 59 | Admitting: Internal Medicine

## 2013-09-29 VITALS — BP 110/80 | HR 89 | Temp 98.4°F | Resp 14 | Wt 207.6 lb

## 2013-09-29 DIAGNOSIS — R600 Localized edema: Secondary | ICD-10-CM

## 2013-09-29 DIAGNOSIS — R609 Edema, unspecified: Secondary | ICD-10-CM

## 2013-09-29 DIAGNOSIS — I10 Essential (primary) hypertension: Secondary | ICD-10-CM

## 2013-09-29 DIAGNOSIS — Z8546 Personal history of malignant neoplasm of prostate: Secondary | ICD-10-CM

## 2013-09-29 NOTE — Progress Notes (Signed)
Subjective:    Patient ID: [name] Holder, male    DOB: [birth year], 58 y.o.   MRN: [id number]  HPI Symptoms began 3-4 weeks ago as dependent edema in L lower calf to ankle joint. There was no prolonged immobilization or prolonged travel as predisposition to the edema. He denies significant salt intake.  He states that the edema typically will appear approximately mid day. It is most noticeable at the top of his elastic stockings.  Reports swelling is resolved if L leg is raised for approximately 15 minutes.  Patient has prostate cancer; L inguinal lymph nodes were removed approximately 9 months ago. He was seen by Dr. Alinda Money at [facility] on 2/24. Carotid dopplers were ordered for concern over possible DVTs. Doppler studies were negative. He was advised to follow up with PCP.   He has had extensive studies done this month. On 09/05/13 urinalysis revealed no protein; normal  studies included liver function tests, renal function, and TSH.    Review of Systems HYPERTENSION  Disease Monitoring: sometimes by RN at his work  Blood pressure range/ average :120/70s average  Medication Compliance: yes  HYPERLIPIDEMIA  Disease Monitoring: Lipitor began one month ago; denies myalgias.  Medication Compliance: yes  Chest pain, palpitations: no  Dyspnea: no  Edema: 2+ pitting edema L from mid calf to ankle joint for last 3-4 weeks.  Claudication: no  Lightheadedness,Syncope: no  Weight gain/loss: 2-3 lbs since last visit; exercise routine altered recently.  Polyuria/phagia/dipsia: no  Blurred vision /diplopia/lossof vision: no  Limb numbness/tingling/burning: no  Non healing skin lesions: no  Abd pain, bowel changes: no  Myalgias: no  Memory loss: no     Objective:   Physical Exam Gen.: Healthy and well-nourished in appearance. Alert, appropriate and cooperative throughout exam.  Appears younger than stated age  Head: Normocephalic without obvious abnormalities Eyes: No corneal or  conjunctival inflammation noted. Pupils equal round reactive to light and accommodation. Extraocular motion intact. No icterus Nose: External nasal exam reveals no deformity or inflammation. Nasal mucosa are pink and moist. No lesions or exudates noted.  Mouth: Oral mucosa and oropharynx reveal no lesions or exudates. Teeth in good repair.  Neck: No deformities, masses, or tenderness noted.  Thyroid normal. No NVD @ 10 degrees Lungs: Normal respiratory effort; chest expands symmetrically. Lungs are clear to auscultation without rales, wheezes, or increased work of breathing.   Chest: Excess 3 areola on the right in the nipple line Heart: Normal rate and rhythm. Normal S1 and S2. No gallop, click, or rub. No* murmur.  Abdomen: Bowel sounds normal; abdomen soft and nontender. No masses, organomegaly or hernias noted. No HJR Musculoskeletal/extremities: No deformity or scoliosis noted of the thoracic or lumbar spine.   No clubbing, cyanosis or significant extremity deformity noted. One half-1+ edema is noted in the left lower extremity. There is some edematous quality on the left as well. Trace edema is noted in the right extremity at the ankle Homans sign negative bilaterally Tone & strength normal.  Hand joints normal . Fingernail  health good.  Able to lie down & sit up w/o help. Negative SLR bilaterally  Vascular: Carotid, radial artery, dorsalis pedis and posterior tibial pulses are full and equal. No bruits present.  Neurologic: Alert and oriented x3. Deep tendon reflexes symmetrical and normal.  Gait normal including heel & toe walking . Rhomberg & finger to nose  Skin: Intact without suspicious lesions or rashes.  Lymph: No cervical, axillary lymphadenopathy present.  Psych: Mood  and affect are normal. Normally interactive         Assessment & Plan:  #1 asymmetric pedal edema, worse on the left. His history and exam suggests some venous insufficiency postoperatively. There is no  evidence of hepatic or renal insufficiency or cardiac dysfunction. Exam and Doppler rule out deep venous thrombosis. There is mild lymphedema of the left ankle but there is no suggestion of lymphatic compromise related to his diagnosis of cancer and lymph node resection. Plan: He should continue to avoid excess salt. Over the calf low compression stockings are recommended.

## 2013-09-29 NOTE — Patient Instructions (Signed)
Wear over the calf low compression support hose if you are standing for prolonged periods of  time or working on your feet for prolonged periods of time.

## 2013-09-29 NOTE — Progress Notes (Signed)
Pre visit review using our clinic review tool, if applicable. No additional management support is needed unless otherwise documented below in the visit note. 

## 2013-09-29 NOTE — Progress Notes (Signed)
Subjective:    Patient ID: AMUN STEMM, male    DOB: [birth year], 58 y.o.   MRN: [id number]  HPI Symptoms began 3-4 weeks ago as dependent edema in L lower calf to ankle joint. Reports swelling is resolved if L leg is raised for approximately 15 minutes.  Patient has prostate cancer; L inguinal lymph nodes were removed approximately 9 months ago. He was seen by Dr. Alinda Money at [facility] on 2/24. Carotid dopplers were ordered for concern over possible DVTs. Doppler studies were negative. He was advised to follow up with PCP.    Review of Systems HYPERTENSION Disease Monitoring: sometimes by RN at his work Blood pressure range/ average :120/70s average Medication Compliance: yes  HYPERLIPIDEMIA Disease Monitoring: Lipitor began one month ago; denies myalgias.  Medication Compliance: yes  Chest pain, palpitations: no      Dyspnea: no Edema: 2+ pitting edema L from mid calf to ankle joint for last 3-4 weeks. Claudication: no Lightheadedness,Syncope: no Weight gain/loss: 2-3 lbs since last visit; exercise routine altered recently.  Polyuria/phagia/dipsia: no   Blurred vision /diplopia/lossof vision: no Limb numbness/tingling/burning: no Non healing skin lesions: no Abd pain, bowel changes: no  Myalgias: no Memory loss: no     Objective:   Physical Exam Gen.: Healthy and well-nourished in appearance. Alert, appropriate and cooperative throughout exam. Appears younger than stated age  Head: Normocephalic without obvious abnormalities. Eyes: No corneal or conjunctival inflammation noted. Pupils equal round reactive to light and accommodation. Extraocular motion intact. Fundal exam is benign without hemorrhages, exudate, papilledema.  Vision grossly normal with /w/o lenses Ears: External  ear exam reveals no significant lesions or deformities. Canals clear .TMs normal. Hearing is grossly normal bilaterally. Nose: External nasal exam reveals no deformity or inflammation. Nasal  mucosa are pink and moist. No lesions or exudates noted.   Mouth: Oral mucosa and oropharynx reveal no lesions or exudates. Teeth in good repair. Neck: No deformities, masses, or tenderness noted. Range of motion WNL. Thyroid palpable. Lungs: Normal respiratory effort; chest expands symmetrically. Lungs are clear to auscultation without rales, wheezes, or increased work of breathing. Heart: Normal rate and rhythm. Normal S1 and S2. No gallop, click, or rub.  Abdomen: Bowel sounds normal; abdomen soft and nontender. No masses, organomegaly or hernias noted. Genitalia:  MALE:Genitalia normal except for left varices. Prostate is normal without enlargement, asymmetry, nodularity, or induration   OR                                 Musculoskeletal/extremities: No deformity or scoliosis noted of  the thoracic or lumbar spine.   OR Accentuated curvature of upper thoracic spine. OR There is some asymmetry of the posterior thoracic musculature suggesting occult scoliosis. No clubbing, cyanosis, edema, or significant extremity  deformity noted. Range of motion normal .Tone & strength normal. Hand joints normal OR reveal mild  DJD DIP changes.  Fingernail / toenail health good. Able to lie down & sit up w/o help. Negative SLR bilaterally Vascular: Carotid, radial artery, dorsalis pedis and  posterior tibial pulses are full and equal. No bruits present. Neurologic: Alert and oriented x3. Deep tendon reflexes symmetrical and normal.  Gait normal  including heel & toe walking . Rhomberg & finger to nose       Skin: Intact without suspicious lesions or rashes. Lymph: No cervical, axillary, or inguinal lymphadenopathy present. Psych: Mood and affect are normal. Normally interactive  Assessment & Plan:

## 2013-09-30 DIAGNOSIS — R600 Localized edema: Secondary | ICD-10-CM | POA: Insufficient documentation

## 2013-11-27 ENCOUNTER — Ambulatory Visit: Payer: 59 | Admitting: Internal Medicine

## 2013-12-21 ENCOUNTER — Encounter: Payer: Self-pay | Admitting: Internal Medicine

## 2013-12-21 ENCOUNTER — Other Ambulatory Visit (INDEPENDENT_AMBULATORY_CARE_PROVIDER_SITE_OTHER): Payer: 59

## 2013-12-21 ENCOUNTER — Ambulatory Visit (INDEPENDENT_AMBULATORY_CARE_PROVIDER_SITE_OTHER): Payer: 59 | Admitting: Internal Medicine

## 2013-12-21 VITALS — BP 120/76 | HR 95 | Temp 98.5°F | Resp 16 | Ht 69.0 in | Wt 208.0 lb

## 2013-12-21 DIAGNOSIS — R600 Localized edema: Secondary | ICD-10-CM

## 2013-12-21 DIAGNOSIS — R609 Edema, unspecified: Secondary | ICD-10-CM

## 2013-12-21 DIAGNOSIS — E785 Hyperlipidemia, unspecified: Secondary | ICD-10-CM

## 2013-12-21 DIAGNOSIS — I10 Essential (primary) hypertension: Secondary | ICD-10-CM

## 2013-12-21 DIAGNOSIS — Z8546 Personal history of malignant neoplasm of prostate: Secondary | ICD-10-CM

## 2013-12-21 DIAGNOSIS — R7309 Other abnormal glucose: Secondary | ICD-10-CM

## 2013-12-21 LAB — BASIC METABOLIC PANEL
BUN: 14 mg/dL (ref 6–23)
CO2: 27 mEq/L (ref 19–32)
Calcium: 9.9 mg/dL (ref 8.4–10.5)
Chloride: 101 mEq/L (ref 96–112)
Creatinine, Ser: 0.9 mg/dL (ref 0.4–1.5)
GFR: 96.44 mL/min (ref >60.00)
Glucose, Bld: 107 mg/dL — ABNORMAL HIGH (ref 70–99)
Potassium: 4 mEq/L (ref 3.5–5.1)
Sodium: 138 mEq/L (ref 135–145)

## 2013-12-21 LAB — LIPID PANEL
Cholesterol: 150 mg/dL (ref 0–200)
HDL: 69.4 mg/dL (ref >39.00)
LDL Cholesterol: 70 mg/dL (ref 0–99)
Total CHOL/HDL Ratio: 2
Triglycerides: 52 mg/dL (ref 0.0–149.0)
VLDL: 10.4 mg/dL (ref 0.0–40.0)

## 2013-12-21 LAB — HEMOGLOBIN A1C: Hgb A1c MFr Bld: 5.5 % (ref 4.6–6.5)

## 2013-12-21 NOTE — Assessment & Plan Note (Signed)
I will check his A1C to see if he has developed DM2 

## 2013-12-21 NOTE — Progress Notes (Signed)
Pre visit review using our clinic review tool, if applicable. No additional management support is needed unless otherwise documented below in the visit note. 

## 2013-12-21 NOTE — Assessment & Plan Note (Signed)
He is doing well on lipitor I will recheck his FLP today

## 2013-12-21 NOTE — Patient Instructions (Signed)

## 2013-12-21 NOTE — Assessment & Plan Note (Signed)
His BP is well controlled I think the CCB is causing edema so I have asked him to stop taking it I will monitor his lytes and renal function today

## 2013-12-21 NOTE — Assessment & Plan Note (Signed)
Will stop amlodipine

## 2013-12-21 NOTE — Progress Notes (Signed)
Subjective:    Patient ID: [name] Holder, male    DOB: February 19, 1956, 58 y.o.   MRN: [id number]  Hyperlipidemia This is a chronic problem. The current episode started more than 1 year ago. The problem is controlled. Recent lipid tests were reviewed and are normal. He has no history of chronic renal disease, diabetes, hypothyroidism, liver disease, obesity or nephrotic syndrome. There are no known factors aggravating his hyperlipidemia. Pertinent negatives include no chest pain, focal sensory loss, focal weakness, leg pain, myalgias or shortness of breath. Current antihyperlipidemic treatment includes statins. The current treatment provides moderate improvement of lipids. There are no compliance problems.       Review of Systems  Constitutional: Negative.  Negative for fever, chills, diaphoresis, appetite change and fatigue.  HENT: Negative.   Eyes: Negative.   Respiratory: Negative.  Negative for cough, choking, chest tightness, shortness of breath and stridor.   Cardiovascular: Positive for leg swelling (ankle edema). Negative for chest pain and palpitations.  Gastrointestinal: Negative.  Negative for nausea, abdominal pain, diarrhea, constipation and blood in stool.  Endocrine: Negative.   Genitourinary: Negative.   Musculoskeletal: Positive for arthralgias (left ankle only). Negative for back pain, joint swelling, myalgias, neck pain and neck stiffness.  Skin: Negative.   Allergic/Immunologic: Negative.   Neurological: Negative.  Negative for dizziness, tremors, focal weakness, speech difficulty, weakness, light-headedness, numbness and headaches.  Hematological: Negative.  Negative for adenopathy. Does not bruise/bleed easily.  Psychiatric/Behavioral: Negative.        Objective:   Physical Exam  Vitals reviewed. Constitutional: He is oriented to person, place, and time. He appears well-developed and well-nourished. No distress.  HENT:  Head: Normocephalic and atraumatic.    Mouth/Throat: Oropharynx is clear and moist. No oropharyngeal exudate.  Eyes: Conjunctivae are normal. Right eye exhibits no discharge. Left eye exhibits no discharge. No scleral icterus.  Neck: Normal range of motion. Neck supple. No JVD present. No tracheal deviation present. No thyromegaly present.  Cardiovascular: Normal rate, regular rhythm, normal heart sounds and intact distal pulses.  Exam reveals no gallop and no friction rub.   No murmur heard. Pulmonary/Chest: Effort normal and breath sounds normal. No stridor. No respiratory distress. He has no wheezes. He has no rales. He exhibits no tenderness.  Abdominal: Soft. Bowel sounds are normal. He exhibits no distension and no mass. There is no tenderness. There is no rebound and no guarding.  Musculoskeletal: Normal range of motion. He exhibits edema (trace edema in BLE). He exhibits no tenderness.  Lymphadenopathy:    He has no cervical adenopathy.  Neurological: He is oriented to person, place, and time.  Skin: Skin is warm and dry. No rash noted. He is not diaphoretic. No erythema. No pallor.  Psychiatric: He has a normal mood and affect. His behavior is normal. Judgment and thought content normal.     Lab Results  Component Value Date   WBC 7.5 09/05/2013   HGB 15.1 09/05/2013   HCT 44.3 09/05/2013   PLT 267.0 09/05/2013   GLUCOSE 109* 09/05/2013   CHOL 192 09/05/2013   TRIG 41.0 09/05/2013   HDL 73.30 09/05/2013   LDLCALC 111* 09/05/2013   ALT 31 09/05/2013   AST 24 09/05/2013   NA 136 09/05/2013   K 4.4 09/05/2013   CL 100 09/05/2013   CREATININE 0.8 09/05/2013   BUN 12 09/05/2013   CO2 27 09/05/2013   TSH 0.66 09/05/2013   PSA 20.21* 08/26/2012   INR 0.88 12/08/2012   HGBA1C  5.5 08/26/2012       Assessment & Plan:

## 2013-12-22 ENCOUNTER — Telehealth: Payer: Self-pay | Admitting: Internal Medicine

## 2013-12-22 NOTE — Telephone Encounter (Signed)
Relevant patient education assigned to patient using Emmi. ° °

## 2014-01-01 ENCOUNTER — Other Ambulatory Visit: Payer: Self-pay

## 2014-01-01 ENCOUNTER — Other Ambulatory Visit: Payer: Self-pay | Admitting: Physician Assistant

## 2014-01-01 DIAGNOSIS — Z0000 Encounter for general adult medical examination without abnormal findings: Secondary | ICD-10-CM

## 2014-01-01 DIAGNOSIS — Z9109 Other allergy status, other than to drugs and biological substances: Secondary | ICD-10-CM

## 2014-01-01 MED ORDER — VALSARTAN-HYDROCHLOROTHIAZIDE 160-12.5 MG PO TABS: ORAL_TABLET | ORAL | Status: DC

## 2014-01-03 NOTE — Telephone Encounter (Signed)
Pt called states after stopping Amlodipine his BP started being elevated.  He resumed Amlodipine and is requesting a refill.  There is a pending request from the pharmacy.  Please advise

## 2014-01-08 ENCOUNTER — Ambulatory Visit (INDEPENDENT_AMBULATORY_CARE_PROVIDER_SITE_OTHER): Payer: 59 | Admitting: Internal Medicine

## 2014-01-08 ENCOUNTER — Encounter: Payer: Self-pay | Admitting: Internal Medicine

## 2014-01-08 VITALS — BP 144/84 | HR 91 | Temp 98.5°F | Resp 16 | Ht 69.0 in | Wt 205.0 lb

## 2014-01-08 DIAGNOSIS — E785 Hyperlipidemia, unspecified: Secondary | ICD-10-CM

## 2014-01-08 DIAGNOSIS — I10 Essential (primary) hypertension: Secondary | ICD-10-CM

## 2014-01-08 LAB — PSA: PSA: 1.72

## 2014-01-08 MED ORDER — NEBIVOLOL HCL 5 MG PO TABS: 5.0000 mg | ORAL_TABLET | Freq: Every day | ORAL | Status: DC

## 2014-01-08 NOTE — Patient Instructions (Signed)

## 2014-01-08 NOTE — Progress Notes (Signed)
   Subjective:    Patient ID: [name] Holder, male    DOB: 1955/11/13, 58 y.o.   MRN: [id number]  HPI Comments: His BP at home has been 150-160/90-100.  Hypertension This is a chronic problem. The current episode started more than 1 year ago. The problem has been gradually improving since onset. The problem is uncontrolled. Pertinent negatives include no anxiety, blurred vision, chest pain, headaches, malaise/fatigue, neck pain, orthopnea, palpitations, peripheral edema, PND, shortness of breath or sweats. Agents associated with hypertension include NSAIDs. Risk factors for coronary artery disease include smoking/tobacco exposure. Past treatments include angiotensin blockers and diuretics. The current treatment provides moderate improvement. Compliance problems include exercise and diet.       Review of Systems  Constitutional: Negative.  Negative for fever, chills, malaise/fatigue, diaphoresis, appetite change and fatigue.  HENT: Negative.   Eyes: Negative.  Negative for blurred vision.  Respiratory: Negative.  Negative for cough, choking, chest tightness, shortness of breath and stridor.   Cardiovascular: Negative.  Negative for chest pain, palpitations, orthopnea, leg swelling and PND.  Gastrointestinal: Negative.  Negative for nausea, vomiting, abdominal pain, diarrhea, constipation and blood in stool.  Endocrine: Negative.   Genitourinary: Negative.   Musculoskeletal: Negative.  Negative for arthralgias, back pain, myalgias and neck pain.  Skin: Negative.  Negative for rash.  Allergic/Immunologic: Negative.   Neurological: Negative.  Negative for headaches.  Hematological: Negative.  Negative for adenopathy. Does not bruise/bleed easily.  Psychiatric/Behavioral: Negative.        Objective:   Physical Exam  Vitals reviewed. Constitutional: He is oriented to person, place, and time. He appears well-developed and well-nourished. No distress.  HENT:  Head: Normocephalic and  atraumatic.  Mouth/Throat: Oropharynx is clear and moist. No oropharyngeal exudate.  Eyes: Conjunctivae are normal. Right eye exhibits no discharge. Left eye exhibits no discharge. No scleral icterus.  Neck: Normal range of motion. Neck supple. No JVD present. No tracheal deviation present. No thyromegaly present.  Cardiovascular: Normal rate, regular rhythm, normal heart sounds and intact distal pulses.  Exam reveals no gallop and no friction rub.   No murmur heard. Pulmonary/Chest: Effort normal and breath sounds normal. No stridor. No respiratory distress. He has no wheezes. He has no rales. He exhibits no tenderness.  Abdominal: Soft. Bowel sounds are normal. He exhibits no distension and no mass. There is no tenderness. There is no rebound and no guarding.  Musculoskeletal: Normal range of motion. He exhibits no edema and no tenderness.  Lymphadenopathy:    He has no cervical adenopathy.  Neurological: He is oriented to person, place, and time.  Skin: Skin is warm and dry. No rash noted. He is not diaphoretic. No erythema. No pallor.     Lab Results  Component Value Date   WBC 7.5 09/05/2013   HGB 15.1 09/05/2013   HCT 44.3 09/05/2013   PLT 267.0 09/05/2013   GLUCOSE 107* 12/21/2013   CHOL 150 12/21/2013   TRIG 52.0 12/21/2013   HDL 69.40 12/21/2013   LDLCALC 70 12/21/2013   ALT 31 09/05/2013   AST 24 09/05/2013   NA 138 12/21/2013   K 4.0 12/21/2013   CL 101 12/21/2013   CREATININE 0.9 12/21/2013   BUN 14 12/21/2013   CO2 27 12/21/2013   TSH 0.66 09/05/2013   PSA 20.21* 08/26/2012   INR 0.88 12/08/2012   HGBA1C 5.5 12/21/2013       Assessment & Plan:

## 2014-01-08 NOTE — Assessment & Plan Note (Signed)
He has achieved his LDL goal and is doing well on lipitor 

## 2014-01-08 NOTE — Assessment & Plan Note (Signed)
His BP is not adequately well controlled Will add bystolic to the ARB/HCTZ combination

## 2014-01-09 ENCOUNTER — Telehealth: Payer: Self-pay | Admitting: Internal Medicine

## 2014-01-09 NOTE — Telephone Encounter (Signed)
Relevant patient education assigned to patient using Emmi. ° °

## 2014-01-17 ENCOUNTER — Emergency Department (HOSPITAL_COMMUNITY): Payer: 59

## 2014-01-17 ENCOUNTER — Encounter (HOSPITAL_COMMUNITY): Payer: Self-pay | Admitting: Emergency Medicine

## 2014-01-17 ENCOUNTER — Emergency Department (HOSPITAL_COMMUNITY)
Admission: EM | Admit: 2014-01-17 | Discharge: 2014-01-18 | Disposition: A | Discharge status: Home / Self Care | Payer: 59 | Attending: Emergency Medicine | Admitting: Emergency Medicine

## 2014-01-17 DIAGNOSIS — F17200 Nicotine dependence, unspecified, uncomplicated: Secondary | ICD-10-CM | POA: Insufficient documentation

## 2014-01-17 DIAGNOSIS — Z860100 Personal history of colon polyps, unspecified: Secondary | ICD-10-CM | POA: Insufficient documentation

## 2014-01-17 DIAGNOSIS — Y93K1 Activity, walking an animal: Secondary | ICD-10-CM | POA: Insufficient documentation

## 2014-01-17 DIAGNOSIS — J45909 Unspecified asthma, uncomplicated: Secondary | ICD-10-CM | POA: Insufficient documentation

## 2014-01-17 DIAGNOSIS — Z8546 Personal history of malignant neoplasm of prostate: Secondary | ICD-10-CM | POA: Insufficient documentation

## 2014-01-17 DIAGNOSIS — Y9289 Other specified places as the place of occurrence of the external cause: Secondary | ICD-10-CM | POA: Insufficient documentation

## 2014-01-17 DIAGNOSIS — R296 Repeated falls: Secondary | ICD-10-CM | POA: Insufficient documentation

## 2014-01-17 DIAGNOSIS — IMO0002 Reserved for concepts with insufficient information to code with codable children: Secondary | ICD-10-CM | POA: Insufficient documentation

## 2014-01-17 DIAGNOSIS — Z8739 Personal history of other diseases of the musculoskeletal system and connective tissue: Secondary | ICD-10-CM | POA: Insufficient documentation

## 2014-01-17 DIAGNOSIS — Z87442 Personal history of urinary calculi: Secondary | ICD-10-CM | POA: Insufficient documentation

## 2014-01-17 DIAGNOSIS — S2249XA Multiple fractures of ribs, unspecified side, initial encounter for closed fracture: Secondary | ICD-10-CM | POA: Insufficient documentation

## 2014-01-17 DIAGNOSIS — S2232XA Fracture of one rib, left side, initial encounter for closed fracture: Secondary | ICD-10-CM

## 2014-01-17 DIAGNOSIS — I10 Essential (primary) hypertension: Secondary | ICD-10-CM | POA: Insufficient documentation

## 2014-01-17 DIAGNOSIS — Z79899 Other long term (current) drug therapy: Secondary | ICD-10-CM | POA: Insufficient documentation

## 2014-01-17 HISTORY — DX: Other specified disorders of bone density and structure, unspecified site: M85.80

## 2014-01-17 NOTE — ED Notes (Signed)
Pt states he was walking his golden retriever tonight and did not see the rabbit but did and the dog pulled him down and he injured his ribs on the left side

## 2014-01-18 MED ORDER — OXYCODONE-ACETAMINOPHEN 5-325 MG PO TABS
2.0000 | ORAL_TABLET | Freq: Once | ORAL | Status: AC
  Administered 2014-01-18: 2 via ORAL
  Filled 2014-01-18: qty 2

## 2014-01-18 MED ORDER — OXYCODONE-ACETAMINOPHEN 5-325 MG PO TABS: 1.0000 | ORAL_TABLET | ORAL | Status: DC | PRN

## 2014-01-18 NOTE — Discharge Instructions (Signed)
You were evaluated for your left rib pain. Your x-rays today showed nondisplaced fractures of your sixth and seventh left rib. There were also signs of mild atelectasis of the lungs in both lung bases. This may be chronic and related to your asthma. There were no signs of pneumothorax on your x-ray at this time. You have been given an incentive spirometer to use to help exercise your lungs and prevent pneumonia. Please use this as often as he can while awake. Followup with your primary care provider for continued evaluation and treatment. Return anytime for changing or worsening symptoms.    Rib Fracture A rib fracture is a break or crack in one of the bones of the ribs. The ribs are a group of long, curved bones that wrap around your chest and attach to your spine. They protect your lungs and other organs in the chest cavity. A broken or cracked rib is often painful, but most do not cause other problems. Most rib fractures heal on their own over time. However, rib fractures can be more serious if multiple ribs are broken or if broken ribs move out of place and push against other structures. CAUSES   A direct blow to the chest. For example, this could happen during contact sports, a car accident, or a fall against a hard object.  Repetitive movements with high force, such as pitching a baseball or having severe coughing spells. SYMPTOMS   Pain when you breathe in or cough.  Pain when someone presses on the injured area. DIAGNOSIS  Your caregiver will perform a physical exam. Various imaging tests may be ordered to confirm the diagnosis and to look for related injuries. These tests may include a chest X-ray, computed tomography (CT), magnetic resonance imaging (MRI), or a bone scan. TREATMENT  Rib fractures usually heal on their own in 1-3 months. The longer healing period is often associated with a continued cough or other aggravating activities. During the healing period, pain control is very  important. Medication is usually given to control pain. Hospitalization or surgery may be needed for more severe injuries, such as those in which multiple ribs are broken or the ribs have moved out of place.  HOME CARE INSTRUCTIONS   Avoid strenuous activity and any activities or movements that cause pain. Be careful during activities and avoid bumping the injured rib.  Gradually increase activity as directed by your caregiver.  Only take over-the-counter or prescription medications as directed by your caregiver. Do not take other medications without asking your caregiver first.  Apply ice to the injured area for the first 1-2 days after you have been treated or as directed by your caregiver. Applying ice helps to reduce inflammation and pain.  Put ice in a plastic bag.  Place a towel between your skin and the bag.   Leave the ice on for 15-20 minutes at a time, every 2 hours while you are awake.  Perform deep breathing as directed by your caregiver. This will help prevent pneumonia, which is a common complication of a broken rib. Your caregiver may instruct you to:  Take deep breaths several times a day.  Try to cough several times a day, holding a pillow against the injured area.  Use a device called an incentive spirometer to practice deep breathing several times a day.  Drink enough fluids to keep your urine clear or pale yellow. This will help you avoid constipation.   Do not wear a rib belt or binder. These restrict  breathing, which can lead to pneumonia.  SEEK IMMEDIATE MEDICAL CARE IF:   You have a fever.   You have difficulty breathing or shortness of breath.   You develop a continual cough, or you cough up thick or bloody sputum.  You feel sick to your stomach (nausea), throw up (vomit), or have abdominal pain.   You have worsening pain not controlled with medications.  MAKE SURE YOU:  Understand these instructions.  Will watch your condition.  Will get  help right away if you are not doing well or get worse. Document Released: 07/13/2005 Document Revised: 03/15/2013 Document Reviewed: 09/14/2012 [facility] Patient Information 2015 [place], [place]. This information is not intended to replace advice given to you by your health care provider. Make sure you discuss any questions you have with your health care provider.

## 2014-01-18 NOTE — ED Provider Notes (Signed)
Medical screening examination/treatment/procedure(s) were performed by non-physician practitioner and as supervising physician I was immediately available for consultation/collaboration.   EKG Interpretation None       Threasa Beards, MD 01/18/14 [phone]

## 2014-01-18 NOTE — ED Provider Notes (Signed)
CSN: [id number]     Arrival date & time 01/17/14  2230 History   First MD Initiated Contact with Patient 01/18/14 0056     Chief Complaint  Patient presents with  . Ribcage pain   HPI  History provided by the patient. Patient is a 58 year old male with history of hypertension, prostate cancer and osteopenia who presents with complaints of left rib and chest wall pain after a fall. Patient reports being outside walking his 2 golden retriever dogs on leashes that he was holding with his left hand when his dog suddenly chased after a rabbit pulling him forward into the ground. Patient landed onto his left chest and has had pain since that time. The pain is sharp and severe worse with palpations, certain movements and deep breathing. Denies any associated shortness of breath. There was no head injury or LOC. Denies any neck or back pains. No other aggravating or alleviating factors. No other associated symptoms.    Past Medical History  Diagnosis Date  . Asthma   . Allergy   . Hypertension   . Hx of colonic polyps   . History of kidney stones   . Prostate cancer   . Elevated PSA 10/03/2012    23.37  . History of kidney stones   . Osteopenia    Past Surgical History  Procedure Laterality Date  . Rotator cuff repair  2012  . Hernia repair  ?2008  . Prostate biopsy      11/12 positive biopsies  . Colonoscopy    . Lymphadenectomy Left 01/05/2013    Procedure: ROBOTIC LYMPHADENECTOMY;  Surgeon: Dutch Gray, MD;  Location: [facility];  Service: Urology;  Laterality: Left;   Family History  Problem Relation Age of Onset  . Arthritis Mother   . Diabetes Mother   . Hypertension Mother   . Hyperlipidemia Mother   . Hypertension Father   . Cancer Father     Prostate Cancer/radiation mid 40's  . Stroke Maternal Grandfather   . Cancer Sister     precancerous colon polyps, 6 in colon removed   History  Substance Use Topics  . Smoking status: Current Every Day Smoker -- 0.50 packs/day for 15  years    Types: Cigarettes  . Smokeless tobacco: Never Used  . Alcohol Use: 1.2 oz/week    2 Glasses of wine per week     Comment: wine  1  glasses daily    Review of Systems  Respiratory: Negative for shortness of breath.   All other systems reviewed and are negative.     Allergies  Amlodipine and Tetracycline  Home Medications   Prior to Admission medications   Medication Sig Start Date End Date Taking? Authorizing Provider  albuterol (PROVENTIL HFA;VENTOLIN HFA) 108 (90 BASE) MCG/ACT inhaler Inhale 2 puffs into the lungs every 6 (six) hours as needed for wheezing. 09/19/13   Stacy Gardner, PA-C  Ascorbic Acid (VITAMIN C PO) Take 1 tablet by mouth daily.    Historical Provider, MD  atorvastatin (LIPITOR) 20 MG tablet Take 1 tablet (20 mg total) by mouth daily. 09/06/13   Stacy Gardner, PA-C  azelastine (ASTELIN) 137 MCG/SPRAY nasal spray Place 2 sprays into both nostrils 2 (two) times daily. Use in each nostril as directed 09/19/13   Stacy Gardner, PA-C  CALCIUM-VITAMIN D PO Take 1 tablet by mouth 2 (two) times daily.    Historical Provider, MD  mometasone (NASONEX) 50 MCG/ACT nasal spray Place 2 sprays into the nose daily. 1 spray  in each nostril daily 09/19/13   Stacy Gardner, PA-C  mometasone-formoterol [facility]) 200-5 MCG/ACT AERO Take 2 puffs first thing in am and then another 2 puffs about 12 hours later. 07/25/13   Tanda Rockers, MD  Multiple Vitamin (MULTIVITAMIN) tablet Take 1 tablet by mouth daily.    Historical Provider, MD  naproxen (NAPROSYN) 500 MG tablet  12/13/13   Historical Provider, MD  nebivolol (BYSTOLIC) 5 MG tablet Take 1 tablet (5 mg total) by mouth daily. 01/08/14   [name], MD  valsartan-hydrochlorothiazide (DIOVAN-HCT) 160-12.5 MG per tablet TAKE (1) TABLET DAILY IN THE MORNING. 01/01/14   [name], MD  VITAMIN E PO Take 1 tablet by mouth daily.    Historical Provider, MD   BP 127/62  Pulse 84  Temp(Src) 98.7 F (37.1 C) (Oral)  Resp 20  SpO2  100% Physical Exam  Nursing note and vitals reviewed. Constitutional: He is oriented to person, place, and time. He appears well-developed and well-nourished.  HENT:  Head: Normocephalic.  Cardiovascular: Normal rate and regular rhythm.   Pulmonary/Chest: Effort normal and breath sounds normal. No respiratory distress. He has no wheezes. He has no rales. He exhibits tenderness.    There is mild swelling and tenderness to palpation to the left anterior lateral chest wall and ribs over the sixth and seventh ribs. No crepitus.  Abdominal: Soft. There is no tenderness. There is no rebound and no guarding.  Neurological: He is alert and oriented to person, place, and time.  Skin: Skin is warm.  Psychiatric: He has a normal mood and affect. His behavior is normal.    ED Course  Procedures    COORDINATION OF CARE:  Nursing notes reviewed. Vital signs reviewed. Initial pt interview and examination performed.   Filed Vitals:   01/17/14 2308 01/18/14 0042  BP: 112/59 127/62  Pulse: 80 84  Temp: 98.7 F (37.1 C)   TempSrc: Oral   Resp: 20 20  SpO2: 100% 100%    1:05 AM-patient seen and evaluated. He appears in some discomfort. No acute distress. Normal respirations O2 sats. X-rays demonstrate left sixth and seventh rib fractures. No significant displacement. No pneumothorax. Discussed findings with patient. He is stable for discharge and outpatient treatment. 2 Percocet given here. Incentive spirometer ordered.   Treatment plan initiated: Medications  oxyCODONE-acetaminophen (PERCOCET/ROXICET) 5-325 MG per tablet 2 tablet (not administered)        Imaging Review Dg Ribs Unilateral W/chest Left  01/18/2014   CLINICAL DATA:  Patient fell landing on chest. Left anterior rib pain.  EXAM: LEFT RIBS AND CHEST - 3+ VIEW  COMPARISON:  Chest 12/29/2012  FINDINGS: Normal heart size and pulmonary vascularity. Linear atelectasis demonstrated in the left mid lung in both lung bases, new  since prior study. No focal airspace disease or consolidation. No pneumothorax.  Acute nondisplaced left anterior sixth and seventh rib fractures.  IMPRESSION: Acute nondisplaced left anterior sixth and seventh rib fractures. New linear atelectasis in the left mid lung in both lung bases.   Electronically Signed   By: Lucienne Capers M.D.   On: 01/18/2014 00:29     MDM   Final diagnoses:  Rib fractures, left, closed, initial encounter        Martie Lee, PA-C 01/18/14 [id number]

## 2014-03-03 ENCOUNTER — Other Ambulatory Visit: Payer: Self-pay | Admitting: Physician Assistant

## 2014-03-12 NOTE — Telephone Encounter (Signed)
Pharmacy is calling to check the status of this refill. Please advise.

## 2014-04-03 ENCOUNTER — Other Ambulatory Visit: Payer: Self-pay | Admitting: Internal Medicine

## 2014-07-03 ENCOUNTER — Other Ambulatory Visit: Payer: Self-pay | Admitting: Internal Medicine

## 2014-07-30 ENCOUNTER — Ambulatory Visit (INDEPENDENT_AMBULATORY_CARE_PROVIDER_SITE_OTHER): Payer: 59 | Admitting: Internal Medicine

## 2014-07-30 ENCOUNTER — Encounter: Payer: Self-pay | Admitting: Internal Medicine

## 2014-07-30 ENCOUNTER — Other Ambulatory Visit (INDEPENDENT_AMBULATORY_CARE_PROVIDER_SITE_OTHER): Payer: Self-pay

## 2014-07-30 VITALS — BP 148/88 | HR 84 | Temp 98.8°F | Resp 16 | Ht 69.0 in | Wt 204.0 lb

## 2014-07-30 DIAGNOSIS — E785 Hyperlipidemia, unspecified: Secondary | ICD-10-CM

## 2014-07-30 DIAGNOSIS — Z0000 Encounter for general adult medical examination without abnormal findings: Secondary | ICD-10-CM

## 2014-07-30 DIAGNOSIS — Z9109 Other allergy status, other than to drugs and biological substances: Secondary | ICD-10-CM

## 2014-07-30 DIAGNOSIS — I10 Essential (primary) hypertension: Secondary | ICD-10-CM

## 2014-07-30 DIAGNOSIS — J4520 Mild intermittent asthma, uncomplicated: Secondary | ICD-10-CM

## 2014-07-30 LAB — BASIC METABOLIC PANEL
BUN: 14 mg/dL (ref 6–23)
CO2: 24 mEq/L (ref 19–32)
Calcium: 9.6 mg/dL (ref 8.4–10.5)
Chloride: 103 mEq/L (ref 96–112)
Creatinine, Ser: 0.8 mg/dL (ref 0.4–1.5)
GFR: 106.15 mL/min (ref >60.00)
Glucose, Bld: 88 mg/dL (ref 70–99)
Potassium: 4.7 mEq/L (ref 3.5–5.1)
Sodium: 137 mEq/L (ref 135–145)

## 2014-07-30 MED ORDER — AZELASTINE HCL 0.1 % NA SOLN: 2.0000 | Freq: Two times a day (BID) | NASAL | Status: DC

## 2014-07-30 MED ORDER — ATORVASTATIN CALCIUM 20 MG PO TABS
20.0000 mg | ORAL_TABLET | Freq: Every day | ORAL | Status: DC
Start: 2014-07-30 — End: 2015-09-06

## 2014-07-30 MED ORDER — MOMETASONE FUROATE 50 MCG/ACT NA SUSP: 2.0000 | Freq: Every day | NASAL | Status: DC

## 2014-07-30 MED ORDER — NEBIVOLOL HCL 5 MG PO TABS: 5.0000 mg | ORAL_TABLET | Freq: Every day | ORAL | Status: DC

## 2014-07-30 MED ORDER — VALSARTAN-HYDROCHLOROTHIAZIDE 160-12.5 MG PO TABS: ORAL_TABLET | ORAL | Status: DC

## 2014-07-30 MED ORDER — MOMETASONE FURO-FORMOTEROL FUM 200-5 MCG/ACT IN AERO: INHALATION_SPRAY | RESPIRATORY_TRACT | Status: DC

## 2014-07-30 MED ORDER — ALBUTEROL SULFATE HFA 108 (90 BASE) MCG/ACT IN AERS: INHALATION_SPRAY | RESPIRATORY_TRACT | Status: DC

## 2014-07-30 NOTE — Assessment & Plan Note (Signed)
His BP is well controlled I will monitor his lytes and renal function 

## 2014-07-30 NOTE — Patient Instructions (Signed)

## 2014-07-30 NOTE — Progress Notes (Signed)
   Subjective:    Patient ID: [name] Holder, male    DOB: 01/14/1956, 59 y.o.   MRN: [id number]  Hypertension This is a chronic problem. The current episode started more than 1 year ago. The problem is controlled. Pertinent negatives include no anxiety, blurred vision, chest pain, headaches, malaise/fatigue, neck pain, orthopnea, palpitations, peripheral edema, PND, shortness of breath or sweats. There are no associated agents to hypertension. Past treatments include angiotensin blockers, beta blockers and diuretics. The current treatment provides moderate improvement. Compliance problems include exercise.       Review of Systems  Constitutional: Negative.  Negative for fever, chills, malaise/fatigue, diaphoresis, appetite change and fatigue.  HENT: Negative.   Eyes: Negative.  Negative for blurred vision.  Respiratory: Negative.  Negative for cough, choking, chest tightness, shortness of breath, wheezing and stridor.   Cardiovascular: Negative.  Negative for chest pain, palpitations, orthopnea, leg swelling and PND.  Gastrointestinal: Negative.  Negative for nausea, abdominal pain, diarrhea, constipation, blood in stool and rectal pain.  Endocrine: Negative.   Genitourinary: Negative.   Musculoskeletal: Negative.  Negative for myalgias, back pain, arthralgias and neck pain.  Skin: Negative.   Allergic/Immunologic: Negative.   Neurological: Negative.  Negative for headaches.  Hematological: Negative.  Negative for adenopathy. Does not bruise/bleed easily.  Psychiatric/Behavioral: Negative.        Objective:   Physical Exam  Constitutional: He is oriented to person, place, and time. He appears well-developed and well-nourished. No distress.  HENT:  Head: Normocephalic and atraumatic.  Mouth/Throat: Oropharynx is clear and moist. No oropharyngeal exudate.  Eyes: Conjunctivae are normal. Right eye exhibits no discharge. Left eye exhibits no discharge. No scleral icterus.  Neck:  Normal range of motion. Neck supple. No JVD present. No tracheal deviation present. No thyromegaly present.  Cardiovascular: Normal rate, regular rhythm, normal heart sounds and intact distal pulses.  Exam reveals no gallop and no friction rub.   No murmur heard. Pulmonary/Chest: Effort normal and breath sounds normal. No stridor. No respiratory distress. He has no wheezes. He has no rales. He exhibits no tenderness.  Abdominal: Soft. Bowel sounds are normal. He exhibits no distension and no mass. There is no tenderness. There is no rebound and no guarding.  Musculoskeletal: Normal range of motion. He exhibits no edema or tenderness.  Lymphadenopathy:    He has no cervical adenopathy.  Neurological: He is oriented to person, place, and time.  Skin: Skin is warm and dry. No rash noted. He is not diaphoretic. No erythema. No pallor.  Vitals reviewed.     Lab Results  Component Value Date   WBC 7.5 09/05/2013   HGB 15.1 09/05/2013   HCT 44.3 09/05/2013   PLT 267.0 09/05/2013   GLUCOSE 107* 12/21/2013   CHOL 150 12/21/2013   TRIG 52.0 12/21/2013   HDL 69.40 12/21/2013   LDLCALC 70 12/21/2013   ALT 31 09/05/2013   AST 24 09/05/2013   NA 138 12/21/2013   K 4.0 12/21/2013   CL 101 12/21/2013   CREATININE 0.9 12/21/2013   BUN 14 12/21/2013   CO2 27 12/21/2013   TSH 0.66 09/05/2013   PSA 1.72 09/13/2013   INR 0.88 12/08/2012   HGBA1C 5.5 12/21/2013      Assessment & Plan:

## 2015-05-17 ENCOUNTER — Other Ambulatory Visit: Payer: Self-pay | Admitting: Urology

## 2015-05-17 DIAGNOSIS — M8580 Other specified disorders of bone density and structure, unspecified site: Secondary | ICD-10-CM

## 2015-06-18 ENCOUNTER — Ambulatory Visit
Admission: RE | Admit: 2015-06-18 | Discharge: 2015-06-18 | Disposition: A | Discharge status: Home / Self Care | Payer: 59 | Source: Ambulatory Visit | Attending: Urology | Admitting: Urology

## 2015-06-18 DIAGNOSIS — M8580 Other specified disorders of bone density and structure, unspecified site: Secondary | ICD-10-CM

## 2015-08-29 ENCOUNTER — Other Ambulatory Visit (INDEPENDENT_AMBULATORY_CARE_PROVIDER_SITE_OTHER): Payer: 59

## 2015-08-29 ENCOUNTER — Ambulatory Visit (INDEPENDENT_AMBULATORY_CARE_PROVIDER_SITE_OTHER): Payer: 59 | Admitting: Internal Medicine

## 2015-08-29 ENCOUNTER — Encounter: Payer: Self-pay | Admitting: Internal Medicine

## 2015-08-29 VITALS — BP 112/78 | HR 68 | Temp 98.7°F | Resp 16 | Ht 69.0 in | Wt 215.0 lb

## 2015-08-29 DIAGNOSIS — Z8546 Personal history of malignant neoplasm of prostate: Secondary | ICD-10-CM

## 2015-08-29 DIAGNOSIS — I10 Essential (primary) hypertension: Secondary | ICD-10-CM

## 2015-08-29 DIAGNOSIS — Z0000 Encounter for general adult medical examination without abnormal findings: Secondary | ICD-10-CM

## 2015-08-29 DIAGNOSIS — J4520 Mild intermittent asthma, uncomplicated: Secondary | ICD-10-CM

## 2015-08-29 DIAGNOSIS — Z23 Encounter for immunization: Secondary | ICD-10-CM

## 2015-08-29 LAB — CBC WITH DIFFERENTIAL/PLATELET
Basophils Absolute: 0 10*3/uL (ref 0.0–0.1)
Basophils Relative: 0.6 % (ref 0.0–3.0)
Eosinophils Absolute: 0.1 10*3/uL (ref 0.0–0.7)
Eosinophils Relative: 2.3 % (ref 0.0–5.0)
HCT: 38.7 % — ABNORMAL LOW (ref 39.0–52.0)
Hemoglobin: 13.3 g/dL (ref 13.0–17.0)
Lymphocytes Relative: 37.9 % (ref 12.0–46.0)
Lymphs Abs: 2.1 10*3/uL (ref 0.7–4.0)
MCHC: 34.3 g/dL (ref 30.0–36.0)
MCV: 93.5 fl (ref 78.0–100.0)
Monocytes Absolute: 0.5 10*3/uL (ref 0.1–1.0)
Monocytes Relative: 9.9 % (ref 3.0–12.0)
Neutro Abs: 2.7 10*3/uL (ref 1.4–7.7)
Neutrophils Relative %: 49.3 % (ref 43.0–77.0)
Platelets: 231 10*3/uL (ref 150.0–400.0)
RBC: 4.13 Mil/uL — ABNORMAL LOW (ref 4.22–5.81)
RDW: 12.9 % (ref 11.5–15.5)
WBC: 5.4 10*3/uL (ref 4.0–10.5)

## 2015-08-29 LAB — COMPREHENSIVE METABOLIC PANEL
ALT: 32 U/L (ref 0–53)
AST: 21 U/L (ref 0–37)
Albumin: 4.5 g/dL (ref 3.5–5.2)
Alkaline Phosphatase: 48 U/L (ref 39–117)
BUN: 20 mg/dL (ref 6–23)
CO2: 27 mEq/L (ref 19–32)
Calcium: 9.9 mg/dL (ref 8.4–10.5)
Chloride: 103 mEq/L (ref 96–112)
Creatinine, Ser: 0.85 mg/dL (ref 0.40–1.50)
GFR: 97.2 mL/min (ref >60.00)
Glucose, Bld: 112 mg/dL — ABNORMAL HIGH (ref 70–99)
Potassium: 4.3 mEq/L (ref 3.5–5.1)
Sodium: 139 mEq/L (ref 135–145)
Total Bilirubin: 0.5 mg/dL (ref 0.2–1.2)
Total Protein: 7 g/dL (ref 6.0–8.3)

## 2015-08-29 LAB — LIPID PANEL
Cholesterol: 158 mg/dL (ref 0–200)
HDL: 67.5 mg/dL (ref >39.00)
LDL Cholesterol: 79 mg/dL (ref 0–99)
NonHDL: 90.41
Total CHOL/HDL Ratio: 2
Triglycerides: 55 mg/dL (ref 0.0–149.0)
VLDL: 11 mg/dL (ref 0.0–40.0)

## 2015-08-29 LAB — URINALYSIS, ROUTINE W REFLEX MICROSCOPIC
Bilirubin Urine: NEGATIVE
Hgb urine dipstick: NEGATIVE
Ketones, ur: NEGATIVE
Leukocytes, UA: NEGATIVE
Nitrite: NEGATIVE
RBC / HPF: NONE SEEN (ref 0–?)
Specific Gravity, Urine: 1.02 (ref 1.000–1.030)
Total Protein, Urine: NEGATIVE
Urine Glucose: NEGATIVE
Urobilinogen, UA: 0.2 (ref 0.0–1.0)
WBC, UA: NONE SEEN (ref 0–?)
pH: 6 (ref 5.0–8.0)

## 2015-08-29 LAB — TSH: TSH: 0.66 u[IU]/mL (ref 0.35–4.50)

## 2015-08-29 NOTE — Progress Notes (Signed)
Subjective:  Patient ID: [name] Holder, male    DOB: 1956-03-21  Age: 60 y.o. MRN: [id number]  CC: Hypertension and Annual Exam   HPI [name] Holder presents for a blood pressure check and a complete physical. He feels well and offers no complaints.  Outpatient Prescriptions Prior to Visit  Medication Sig Dispense Refill  . albuterol (PROAIR HFA) 108 (90 BASE) MCG/ACT inhaler 2 PUFFS EVERY 6 HOURS AS NEEDED FOR WHEEZING 8.5 g 11  . atorvastatin (LIPITOR) 20 MG tablet Take 1 tablet (20 mg total) by mouth daily. 30 tablet 11  . azelastine (ASTELIN) 0.1 % nasal spray Place 2 sprays into both nostrils 2 (two) times daily. Use in each nostril as directed 30 mL 11  . CALCIUM-VITAMIN D PO Take 1 tablet by mouth 2 (two) times daily.    . mometasone (NASONEX) 50 MCG/ACT nasal spray Place 2 sprays into the nose daily. 1 spray in each nostril daily 17 g 11  . mometasone-formoterol (DULERA) 200-5 MCG/ACT AERO Take 2 puffs first thing in am and then another 2 puffs about 12 hours later. 1 Inhaler 11  . Multiple Vitamin (MULTIVITAMIN) tablet Take 1 tablet by mouth daily.    . nebivolol (BYSTOLIC) 5 MG tablet Take 1 tablet (5 mg total) by mouth daily. 30 tablet 11  . valsartan-hydrochlorothiazide (DIOVAN-HCT) 160-12.5 MG per tablet TAKE (1) TABLET DAILY IN THE MORNING. 30 tablet 11  . albuterol (PROVENTIL HFA;VENTOLIN HFA) 108 (90 BASE) MCG/ACT inhaler Inhale 2 puffs into the lungs every 6 (six) hours as needed for wheezing. 8.5 g 6   No facility-administered medications prior to visit.    ROS Review of Systems  Constitutional: Negative.  Negative for fever, diaphoresis, appetite change and fatigue.  HENT: Negative.   Eyes: Negative.   Respiratory: Negative.  Negative for cough, choking, chest tightness, shortness of breath and stridor.   Cardiovascular: Negative.  Negative for chest pain, palpitations and leg swelling.  Gastrointestinal: Negative.  Negative for nausea, vomiting, abdominal  pain, diarrhea, constipation and blood in stool.  Endocrine: Negative.   Genitourinary: Negative.  Negative for difficulty urinating.  Musculoskeletal: Negative.  Negative for myalgias, back pain, joint swelling and arthralgias.  Skin: Negative.  Negative for color change and rash.  Allergic/Immunologic: Negative.   Neurological: Negative.  Negative for dizziness, tremors, weakness, light-headedness and headaches.  Hematological: Negative.  Negative for adenopathy. Does not bruise/bleed easily.  Psychiatric/Behavioral: Negative.     Objective:  BP 112/78 mmHg  Pulse 68  Temp(Src) 98.7 F (37.1 C) (Oral)  Resp 16  Ht 5\' 9"  (1.753 m)  Wt 215 lb (97.523 kg)  BMI 31.74 kg/m2  SpO2 98%  BP Readings from Last 3 Encounters:  08/29/15 112/78  07/30/14 148/88  01/18/14 127/62    Wt Readings from Last 3 Encounters:  08/29/15 215 lb (97.523 kg)  07/30/14 204 lb (92.534 kg)  01/08/14 205 lb (92.987 kg)    Physical Exam  Constitutional: He is oriented to person, place, and time. He appears well-developed and well-nourished. No distress.  HENT:  Head: Normocephalic and atraumatic.  Mouth/Throat: Oropharynx is clear and moist. No oropharyngeal exudate.  Eyes: Conjunctivae are normal. Right eye exhibits no discharge. Left eye exhibits no discharge. No scleral icterus.  Neck: Normal range of motion. Neck supple. No JVD present. No tracheal deviation present. No thyromegaly present.  Cardiovascular: Normal rate, regular rhythm, normal heart sounds and intact distal pulses.  Exam reveals no gallop and no friction rub.  No murmur heard. Pulmonary/Chest: Effort normal and breath sounds normal. No stridor. No respiratory distress. He has no wheezes. He has no rales. He exhibits no tenderness.  Abdominal: Soft. Bowel sounds are normal. He exhibits no distension and no mass. There is no tenderness. There is no rebound and no guarding.  Genitourinary:  GU and rectal exams were deferred at his  request and she sees urologist every 4-6 months  Musculoskeletal: Normal range of motion. He exhibits no edema or tenderness.  Lymphadenopathy:    He has no cervical adenopathy.  Neurological: He is oriented to person, place, and time.  Skin: Skin is warm and dry. No rash noted. He is not diaphoretic. No erythema. No pallor.  Psychiatric: He has a normal mood and affect. His behavior is normal. Judgment and thought content normal.  Vitals reviewed.   Lab Results  Component Value Date   WBC 5.4 08/29/2015   HGB 13.3 08/29/2015   HCT 38.7* 08/29/2015   PLT 231.0 08/29/2015   GLUCOSE 112* 08/29/2015   CHOL 158 08/29/2015   TRIG 55.0 08/29/2015   HDL 67.50 08/29/2015   LDLCALC 79 08/29/2015   ALT 32 08/29/2015   AST 21 08/29/2015   NA 139 08/29/2015   K 4.3 08/29/2015   CL 103 08/29/2015   CREATININE 0.85 08/29/2015   BUN 20 08/29/2015   CO2 27 08/29/2015   TSH 0.66 08/29/2015   PSA 1.72 09/13/2013   INR 0.88 12/08/2012   HGBA1C 5.5 12/21/2013    Dg Bone Density  06/18/2015  EXAM: DUAL X-RAY ABSORPTIOMETRY (DXA) FOR BONE MINERAL DENSITY IMPRESSION: Patient: [name] Holder Referring Physician: Raynelle Bring Birth Date: 02-Jul-1956 [age] Patient ID: [id number] Height: 69.0 in. Weight: 215.0 lbs. Measured: 06/18/2015 2:44:22 PM (16 SP 2) Sex: Male Ethnicity: White Analyzed: 06/18/2015 2:49:46 PM (16 SP 2) FRAX* 10-year Probability of Fracture Based on femoral neck BMD: DualFemur (Right) Major Osteoporotic Fracture: 8.3% Hip Fracture:                1.9% Population:                  [place] (Caucasian) Risk Factors:                Secondary Osteoporosis *FRAX is a [place] of [employer] for Metabolic Bone Disease, a World Pharmacologist (WHO) [facility]. Referring Physician:  Raynelle Bring PATIENT: Name: [name] Holder, [name] Holder Patient ID: [id number] Birth Date: 1955-12-16 Height: 69.0 in. Sex: Male Measured: 06/18/2015  Weight: 215.0 lbs. Indications: Caucasian, Depo Lupron injections, Height Loss (781.91), Secondary Osteoporosis Fractures: Treatments: Calcium (E943.0), Vitamin D (E933.5) ASSESSMENT: The BMD measured at Femur Neck Right is 0.743 g/cm2 with a T-score of -2.1. This patient is considered osteopenic. L- 4 was excluded due to degenerative changes. Site Region Measured Date Measured Age YA BMD Significant CHANGE T-score DualFemur Neck Right 06/18/2015    61.4         -2.1    0.743 g/cm2 AP Spine  L1-L3      06/18/2015    61.4         -1.9    0.949 g/cm2 World Health Organization [facility]) criteria for post-menopausal, Caucasian Women: Normal       T-score at or above -1 SD Osteopenia   T-score between -1 and -2.5 SD Osteoporosis T-score at or below -2.5 SD RECOMMENDATION: [place] recommends that FDA-approved medical therapies be  considered in postmenopausal women and men age 17 or older with a: 1. Hip or vertebral (clinical or morphometric) fracture. 2. T-score of <-2.5 at the spine or hip. 3. Ten-year fracture probability by FRAX of 3% or greater for hip fracture or 20% or greater for major osteoporotic fracture. All treatment decisions require clinical judgment and consideration of individual patient factors, including patient preferences, co-morbidities, previous drug use, risk factors not captured in the FRAX model (e.g. falls, vitamin D deficiency, increased bone turnover, interval significant decline in bone density) and possible under - or over-estimation of fracture risk by FRAX. All patients should ensure an adequate intake of dietary calcium (1200 mg/d) and vitamin D (800 IU daily) unless contraindicated. FOLLOW-UP: People with diagnosed cases of osteoporosis or osteopenia should be regularly tested for bone mineral density. For patients eligible for Medicare, routine testing is allowed once every 2 years. The testing frequency can be increased to one year for patients who have rapidly  progressing disease, or for those who are receiving medical therapy to restore bone mass. I have reviewed this report, and agree with the above findings. [facility] FRAX* 10-year Probability of Fracture Based on femoral neck BMD: DualFemur (Right) Major Osteoporotic Fracture: 8.3% Hip Fracture:                1.9% Population:                  [place] (Caucasian) Risk Factors:                Secondary Osteoporosis *FRAX is a Materials engineer of the [employer] of [employer] for Metabolic Bone Disease, a [place] (WHO) [facility]. ASSESSMENT: The probability of a major osteoporitic fracture is 8.3 % within the next ten years. The probability of a hip fracture is 1.9 % within the next ten years. Electronically Signed   By: Lajean Manes M.D.   On: 06/18/2015 16:22    Assessment & Plan:   Mantej was seen today for hypertension and annual exam.  Diagnoses and all orders for this visit:  Need for vaccination with 13-polyvalent pneumococcal conjugate vaccine -     Pneumococcal conjugate vaccine 13-valent  Routine general medical examination at a health care facility- his vaccines were reviewed and updated, exam done, labs ordered and reviewed, patient education material was given -     Lipid panel; Future -     Comprehensive metabolic panel; Future -     CBC with Differential/Platelet; Future -     TSH; Future -     Urinalysis, Routine w reflex microscopic (not at [facility]); Future -     Cancel: PSA; Future -     HIV antibody; Future -     Hepatitis C antibody; Future  History of prostate cancer  Essential hypertension- his blood pressure is well-controlled, electrolytes and renal function are stable.  Asthma, mild intermittent, uncomplicated- his symptoms are well controlled with the current LABA-ICS combination.   I am having Mr. Canales maintain his CALCIUM-VITAMIN D PO, multivitamin, valsartan-hydrochlorothiazide, mometasone-formoterol,  albuterol, atorvastatin, mometasone, nebivolol, and azelastine.  No orders of the defined types were placed in this encounter.     Follow-up: Return in about 6 months (around 02/26/2016).  Scarlette Calico, MD

## 2015-08-29 NOTE — Patient Instructions (Signed)

## 2015-08-29 NOTE — Progress Notes (Signed)
Pre visit review using our clinic review tool, if applicable. No additional management support is needed unless otherwise documented below in the visit note. 

## 2015-08-30 ENCOUNTER — Encounter: Payer: Self-pay | Admitting: Internal Medicine

## 2015-08-30 LAB — HEPATITIS C ANTIBODY: HCV Ab: NEGATIVE

## 2015-08-30 LAB — HIV ANTIBODY (ROUTINE TESTING W REFLEX): HIV 1&2 Ab, 4th Generation: NONREACTIVE

## 2015-09-05 ENCOUNTER — Telehealth: Payer: Self-pay | Admitting: Internal Medicine

## 2015-09-05 NOTE — Telephone Encounter (Signed)
Pt called request all of this medications to be send into [place]. Pt state this was suppose to be done since 08/29/15.

## 2015-09-06 ENCOUNTER — Other Ambulatory Visit: Payer: Self-pay

## 2015-09-06 DIAGNOSIS — Z0000 Encounter for general adult medical examination without abnormal findings: Secondary | ICD-10-CM

## 2015-09-06 DIAGNOSIS — Z9109 Other allergy status, other than to drugs and biological substances: Secondary | ICD-10-CM

## 2015-09-06 DIAGNOSIS — I10 Essential (primary) hypertension: Secondary | ICD-10-CM

## 2015-09-06 MED ORDER — ALBUTEROL SULFATE HFA 108 (90 BASE) MCG/ACT IN AERS: INHALATION_SPRAY | RESPIRATORY_TRACT | Status: DC

## 2015-09-06 MED ORDER — MOMETASONE FUROATE 50 MCG/ACT NA SUSP: 2.0000 | Freq: Every day | NASAL | Status: DC

## 2015-09-06 MED ORDER — AZELASTINE HCL 0.1 % NA SOLN: 2.0000 | Freq: Two times a day (BID) | NASAL | Status: DC

## 2015-09-06 MED ORDER — VALSARTAN-HYDROCHLOROTHIAZIDE 160-12.5 MG PO TABS: ORAL_TABLET | ORAL | Status: DC

## 2015-09-06 MED ORDER — MOMETASONE FURO-FORMOTEROL FUM 200-5 MCG/ACT IN AERO: INHALATION_SPRAY | RESPIRATORY_TRACT | Status: DC

## 2015-09-06 MED ORDER — NEBIVOLOL HCL 5 MG PO TABS: 5.0000 mg | ORAL_TABLET | Freq: Every day | ORAL | Status: DC

## 2015-09-06 MED ORDER — ATORVASTATIN CALCIUM 20 MG PO TABS: 20.0000 mg | ORAL_TABLET | Freq: Every day | ORAL | Status: DC

## 2015-09-06 NOTE — Telephone Encounter (Signed)
Done

## 2015-09-12 ENCOUNTER — Other Ambulatory Visit (HOSPITAL_COMMUNITY): Payer: Self-pay | Admitting: Urology

## 2015-09-12 DIAGNOSIS — C61 Malignant neoplasm of prostate: Secondary | ICD-10-CM

## 2015-10-21 ENCOUNTER — Encounter (HOSPITAL_COMMUNITY)
Admission: RE | Admit: 2015-10-21 | Discharge: 2015-10-21 | Disposition: A | Discharge status: Home / Self Care | Payer: 59 | Source: Ambulatory Visit | Attending: Urology | Admitting: Urology

## 2015-10-21 DIAGNOSIS — C61 Malignant neoplasm of prostate: Secondary | ICD-10-CM | POA: Insufficient documentation

## 2015-10-21 MED ORDER — TECHNETIUM TC 99M MEDRONATE IV KIT
25.0000 | PACK | Freq: Once | INTRAVENOUS | Status: AC | PRN
  Administered 2015-10-21: 25 via INTRAVENOUS

## 2015-10-31 ENCOUNTER — Telehealth: Payer: Self-pay | Admitting: Internal Medicine

## 2015-10-31 NOTE — Telephone Encounter (Signed)
Rec'd from [facility] forward 10 pages to Dr. [name]

## 2016-08-14 ENCOUNTER — Telehealth: Payer: Self-pay

## 2016-08-14 NOTE — Telephone Encounter (Signed)
[place] sent rq to change Ruthe Mannan to insurance preferred rx: Advair, Breo or [facility]OK; or complete PA.   Please advise.

## 2016-08-17 ENCOUNTER — Other Ambulatory Visit: Payer: Self-pay | Admitting: Internal Medicine

## 2016-08-17 DIAGNOSIS — J4520 Mild intermittent asthma, uncomplicated: Secondary | ICD-10-CM

## 2016-08-17 MED ORDER — FLUTICASONE FUROATE-VILANTEROL 200-25 MCG/INH IN AEPB: 1.0000 | INHALATION_SPRAY | Freq: Every day | RESPIRATORY_TRACT | 3 refills | Status: DC

## 2016-08-17 NOTE — Telephone Encounter (Signed)
Rx for breo sent

## 2016-08-31 ENCOUNTER — Encounter: Payer: Self-pay | Admitting: Internal Medicine

## 2016-08-31 ENCOUNTER — Other Ambulatory Visit (INDEPENDENT_AMBULATORY_CARE_PROVIDER_SITE_OTHER): Payer: 59

## 2016-08-31 ENCOUNTER — Ambulatory Visit (INDEPENDENT_AMBULATORY_CARE_PROVIDER_SITE_OTHER): Payer: 59 | Admitting: Internal Medicine

## 2016-08-31 VITALS — BP 144/80 | HR 74 | Temp 98.4°F | Resp 16 | Ht 69.0 in | Wt 211.2 lb

## 2016-08-31 DIAGNOSIS — I10 Essential (primary) hypertension: Secondary | ICD-10-CM

## 2016-08-31 DIAGNOSIS — R7309 Other abnormal glucose: Secondary | ICD-10-CM

## 2016-08-31 DIAGNOSIS — Z0000 Encounter for general adult medical examination without abnormal findings: Secondary | ICD-10-CM

## 2016-08-31 DIAGNOSIS — Z8546 Personal history of malignant neoplasm of prostate: Secondary | ICD-10-CM | POA: Diagnosis not present

## 2016-08-31 LAB — URINALYSIS, ROUTINE W REFLEX MICROSCOPIC
Bilirubin Urine: NEGATIVE
Hgb urine dipstick: NEGATIVE
Ketones, ur: NEGATIVE
Leukocytes, UA: NEGATIVE
Nitrite: NEGATIVE
RBC / HPF: NONE SEEN (ref 0–?)
Specific Gravity, Urine: 1.02 (ref 1.000–1.030)
Total Protein, Urine: NEGATIVE
Urine Glucose: NEGATIVE
Urobilinogen, UA: 0.2 (ref 0.0–1.0)
pH: 6 (ref 5.0–8.0)

## 2016-08-31 LAB — PSA: PSA: 6.88 ng/mL — ABNORMAL HIGH (ref 0.10–4.00)

## 2016-08-31 LAB — LIPID PANEL
Cholesterol: 153 mg/dL (ref 0–200)
HDL: 70.2 mg/dL (ref >39.00)
LDL Cholesterol: 74 mg/dL (ref 0–99)
NonHDL: 82.82
Total CHOL/HDL Ratio: 2
Triglycerides: 46 mg/dL (ref 0.0–149.0)
VLDL: 9.2 mg/dL (ref 0.0–40.0)

## 2016-08-31 LAB — COMPREHENSIVE METABOLIC PANEL
ALT: 36 U/L (ref 0–53)
AST: 23 U/L (ref 0–37)
Albumin: 4.7 g/dL (ref 3.5–5.2)
Alkaline Phosphatase: 57 U/L (ref 39–117)
BUN: 15 mg/dL (ref 6–23)
CO2: 28 mEq/L (ref 19–32)
Calcium: 9.7 mg/dL (ref 8.4–10.5)
Chloride: 102 mEq/L (ref 96–112)
Creatinine, Ser: 0.79 mg/dL (ref 0.40–1.50)
GFR: 105.42 mL/min (ref >60.00)
Glucose, Bld: 105 mg/dL — ABNORMAL HIGH (ref 70–99)
Potassium: 4.6 mEq/L (ref 3.5–5.1)
Sodium: 138 mEq/L (ref 135–145)
Total Bilirubin: 0.4 mg/dL (ref 0.2–1.2)
Total Protein: 6.8 g/dL (ref 6.0–8.3)

## 2016-08-31 LAB — CBC WITH DIFFERENTIAL/PLATELET
Basophils Absolute: 0 10*3/uL (ref 0.0–0.1)
Basophils Relative: 0.6 % (ref 0.0–3.0)
Eosinophils Absolute: 0.1 10*3/uL (ref 0.0–0.7)
Eosinophils Relative: 1.8 % (ref 0.0–5.0)
HCT: 39.2 % (ref 39.0–52.0)
Hemoglobin: 13.7 g/dL (ref 13.0–17.0)
Lymphocytes Relative: 29.2 % (ref 12.0–46.0)
Lymphs Abs: 1.9 10*3/uL (ref 0.7–4.0)
MCHC: 34.9 g/dL (ref 30.0–36.0)
MCV: 93.4 fl (ref 78.0–100.0)
Monocytes Absolute: 0.6 10*3/uL (ref 0.1–1.0)
Monocytes Relative: 9.6 % (ref 3.0–12.0)
Neutro Abs: 3.9 10*3/uL (ref 1.4–7.7)
Neutrophils Relative %: 58.8 % (ref 43.0–77.0)
Platelets: 212 10*3/uL (ref 150.0–400.0)
RBC: 4.19 Mil/uL — ABNORMAL LOW (ref 4.22–5.81)
RDW: 12.8 % (ref 11.5–15.5)
WBC: 6.7 10*3/uL (ref 4.0–10.5)

## 2016-08-31 LAB — HEMOGLOBIN A1C: Hgb A1c MFr Bld: 5.3 % (ref 4.6–6.5)

## 2016-08-31 LAB — TSH: TSH: 0.49 u[IU]/mL (ref 0.35–4.50)

## 2016-08-31 NOTE — Progress Notes (Signed)
Pre visit review using our clinic review tool, if applicable. No additional management support is needed unless otherwise documented below in the visit note. 

## 2016-08-31 NOTE — Patient Instructions (Signed)

## 2016-08-31 NOTE — Progress Notes (Signed)
Subjective:  Patient ID: [name] Holder, male    DOB: 04-30-1956  Age: 61 y.o. MRN: [id number]  CC: Annual Exam; Hyperlipidemia; and Hypertension   HPI BREYER SNUFFER presents for a CPX. He is taking a statin therapy and denies muscle or joint aches. He tells me his blood pressure has been well controlled and he has had no episodes of CP, DOE, palpitations, edema, fatigue. He's had no recent episodes of wheezing or any exacerbations of asthma and is doing well on Dulera and albuterol. He is being treated by urology for prostate cancer.   Past Medical History:  Diagnosis Date  . Allergy   . Asthma   . Elevated PSA 10/03/2012   23.37  . History of kidney stones   . History of kidney stones   . Hx of colonic polyps   . Hypertension   . Osteopenia   . Prostate cancer [facility])    Past Surgical History:  Procedure Laterality Date  . COLONOSCOPY    . HERNIA REPAIR  ?2008  . LYMPHADENECTOMY Left 01/05/2013   Procedure: ROBOTIC LYMPHADENECTOMY;  Surgeon: Dutch Gray, MD;  Location: [facility];  Service: Urology;  Laterality: Left;  . PROSTATE BIOPSY     11/12 positive biopsies  . ROTATOR CUFF REPAIR  2012    reports that he has quit smoking. His smoking use included Cigarettes. He has a 7.50 pack-year smoking history. He has never used smokeless tobacco. He reports that he drinks about 1.2 oz of alcohol per week . He reports that he does not use drugs. family history includes Arthritis in his mother; Cancer in his father and sister; Diabetes in his mother; Hyperlipidemia in his mother; Hypertension in his father and mother; Stroke in his maternal grandfather. Allergies  Allergen Reactions  . Amlodipine     edema  . Tetracycline Rash    Outpatient Medications Prior to Visit  Medication Sig Dispense Refill  . albuterol (PROAIR HFA) 108 (90 Base) MCG/ACT inhaler 2 PUFFS EVERY 6 HOURS AS NEEDED FOR WHEEZING 8.5 g 11  . atorvastatin (LIPITOR) 20 MG tablet Take 1 tablet (20 mg total) by  mouth daily. 30 tablet 11  . azelastine (ASTELIN) 0.1 % nasal spray Place 2 sprays into both nostrils 2 (two) times daily. Use in each nostril as directed 30 mL 11  . CALCIUM-VITAMIN D PO Take 1 tablet by mouth 2 (two) times daily.    . fluticasone furoate-vilanterol (BREO ELLIPTA) 200-25 MCG/INH AEPB Inhale 1 puff into the lungs daily. 90 each 3  . mometasone (NASONEX) 50 MCG/ACT nasal spray Place 2 sprays into the nose daily. 1 spray in each nostril daily 17 g 11  . nebivolol (BYSTOLIC) 5 MG tablet Take 1 tablet (5 mg total) by mouth daily. 30 tablet 11  . valsartan-hydrochlorothiazide (DIOVAN-HCT) 160-12.5 MG tablet TAKE (1) TABLET DAILY IN THE MORNING. 30 tablet 11  . Multiple Vitamin (MULTIVITAMIN) tablet Take 1 tablet by mouth daily.     No facility-administered medications prior to visit.     ROS Review of Systems  Constitutional: Negative.  Negative for activity change, appetite change, diaphoresis, fatigue and unexpected weight change.  HENT: Negative.  Negative for voice change.   Eyes: Negative for visual disturbance.  Respiratory: Negative for cough, chest tightness, shortness of breath and wheezing.   Cardiovascular: Negative for chest pain, palpitations and leg swelling.  Gastrointestinal: Negative for abdominal pain, blood in stool, constipation, diarrhea, nausea and vomiting.  Endocrine: Negative.  Negative for cold  intolerance and heat intolerance.  Genitourinary: Negative.  Negative for difficulty urinating, dysuria, flank pain, penile swelling, scrotal swelling, testicular pain and urgency.  Musculoskeletal: Negative.  Negative for arthralgias, back pain, myalgias and neck pain.  Skin: Negative.  Negative for pallor and rash.  Allergic/Immunologic: Negative.   Neurological: Negative.  Negative for dizziness, weakness and numbness.  Hematological: Negative.  Negative for adenopathy. Does not bruise/bleed easily.  Psychiatric/Behavioral: Negative.     Objective:  BP  (!) 144/80 (BP Location: Left Arm, Patient Position: Sitting, Cuff Size: Normal)   Pulse 74   Temp 98.4 F (36.9 C) (Oral)   Resp 16   Ht 5\' 9"  (1.753 m)   Wt 211 lb 4 oz (95.8 kg)   SpO2 96%   BMI 31.20 kg/m   BP Readings from Last 3 Encounters:  08/31/16 (!) 144/80  08/29/15 112/78  07/30/14 (!) 148/88    Wt Readings from Last 3 Encounters:  08/31/16 211 lb 4 oz (95.8 kg)  08/29/15 215 lb (97.5 kg)  07/30/14 204 lb (92.5 kg)    Physical Exam  Constitutional: He is oriented to person, place, and time. No distress.  HENT:  Mouth/Throat: Oropharynx is clear and moist. No oropharyngeal exudate.  Eyes: Conjunctivae are normal. Right eye exhibits no discharge. Left eye exhibits no discharge. No scleral icterus.  Neck: Normal range of motion. Neck supple. No JVD present. No tracheal deviation present. No thyromegaly present.  Cardiovascular: Normal rate, regular rhythm, normal heart sounds and intact distal pulses.  Exam reveals no gallop and no friction rub.   No murmur heard. Pulmonary/Chest: Effort normal and breath sounds normal. No stridor. No respiratory distress. He has no wheezes. He has no rales. He exhibits no tenderness.  Abdominal: Soft. Bowel sounds are normal. He exhibits no distension and no mass. There is no tenderness. There is no rebound and no guarding.  Genitourinary:  Genitourinary Comments: GU and rectal exams were deferred at his request since he has followed closely by urology.  Musculoskeletal: Normal range of motion. He exhibits no edema, tenderness or deformity.  Lymphadenopathy:    He has no cervical adenopathy.  Neurological: He is oriented to person, place, and time.  Skin: Skin is warm and dry. No rash noted. He is not diaphoretic. No erythema. No pallor.  Psychiatric: He has a normal mood and affect. His behavior is normal. Judgment and thought content normal.  Vitals reviewed.   Lab Results  Component Value Date   WBC 6.7 08/31/2016   HGB  13.7 08/31/2016   HCT 39.2 08/31/2016   PLT 212.0 08/31/2016   GLUCOSE 105 (H) 08/31/2016   CHOL 153 08/31/2016   TRIG 46.0 08/31/2016   HDL 70.20 08/31/2016   LDLCALC 74 08/31/2016   ALT 36 08/31/2016   AST 23 08/31/2016   NA 138 08/31/2016   K 4.6 08/31/2016   CL 102 08/31/2016   CREATININE 0.79 08/31/2016   BUN 15 08/31/2016   CO2 28 08/31/2016   TSH 0.49 08/31/2016   PSA 6.88 (H) 08/31/2016   INR 0.88 12/08/2012   HGBA1C 5.3 08/31/2016    Nm Bone Scan Whole Body  Result Date: 10/21/2015 CLINICAL DATA:  History prostate malignancy, no current skeletal symptoms, history of previous rib fractures, PSA 2.9 EXAM: NUCLEAR MEDICINE WHOLE BODY BONE SCAN TECHNIQUE: Whole body anterior and posterior images were obtained approximately 3 hours after intravenous injection of radiopharmaceutical. RADIOPHARMACEUTICALS:  26 mCi Technetium-16m MDP IV COMPARISON:  Nuclear bone scan of November 10, 2012 FINDINGS: There is adequate uptake of the radiopharmaceutical by the skeleton. Adequate soft tissue clearance and renal activity is observed. Activity within the calvarium, cervical and thoracic spine spine, and pelvis is normal. There is new increased uptake on the right at approximately L4 predominantly posteriorly.Minimally increased uptake in the posterior aspect of the right tenth rib is again demonstrated there is intensely increased uptake in the region of the left first carpometacarpal joint which is new. Mildly increased uptake about the knees and right foot are consistent with degenerative change. IMPRESSION: 1. No findings highly suspicious for metastatic disease. 2. Increased uptake in the left first carpometacarpal joint and in the lateral aspect of the posterior elements of L4 most compatible with degenerative change. Plain films of the lumbar spine and of the left wrist are recommended. 3. Stable focal increased uptake in the posterior aspect of the right tenth rib consistent with an old  fracture. Electronically Signed   By: David  Martinique M.D.   On: 10/21/2015 14:00    Assessment & Plan:   Miro was seen today for annual exam, hyperlipidemia and hypertension.  Diagnoses and all orders for this visit:  Routine general medical examination at a health care facility- exam completed, labs ordered and reviewed, vaccines reviewed and updated, his colonoscopy is up-to-date, patient education material was given. -     Lipid panel; Future -     Comprehensive metabolic panel; Future -     CBC with Differential/Platelet; Future -     PSA; Future -     TSH; Future -     Urinalysis, Routine w reflex microscopic; Future  Essential hypertension- his blood pressure is adequately well controlled, electrolytes and renal function are normal  Other abnormal glucose- improvement noted with lifestyle modifications, no medications are needed to treat this. -     Hemoglobin A1c; Future  History of prostate cancer- he was informed of his rising PSA level and is asked to discuss this with his urologist.   I have discontinued Mr. Victor's multivitamin. I am also having him maintain his CALCIUM-VITAMIN D PO, albuterol, atorvastatin, azelastine, mometasone, nebivolol, valsartan-hydrochlorothiazide, fluticasone furoate-vilanterol, and diclofenac sodium.  Meds ordered this encounter  Medications  . diclofenac sodium (VOLTAREN) 1 % GEL     Follow-up: Return in about 6 months (around 02/28/2017).  Scarlette Calico, MD

## 2016-09-07 ENCOUNTER — Encounter: Payer: Self-pay | Admitting: Internal Medicine

## 2016-09-28 ENCOUNTER — Other Ambulatory Visit: Payer: Self-pay | Admitting: Internal Medicine

## 2016-09-28 DIAGNOSIS — I10 Essential (primary) hypertension: Secondary | ICD-10-CM

## 2016-10-27 ENCOUNTER — Other Ambulatory Visit: Payer: Self-pay | Admitting: Internal Medicine

## 2016-10-27 DIAGNOSIS — Z9109 Other allergy status, other than to drugs and biological substances: Secondary | ICD-10-CM

## 2016-10-27 DIAGNOSIS — Z0000 Encounter for general adult medical examination without abnormal findings: Secondary | ICD-10-CM

## 2016-10-28 ENCOUNTER — Other Ambulatory Visit (HOSPITAL_COMMUNITY): Payer: Self-pay | Admitting: Urology

## 2016-10-28 DIAGNOSIS — C61 Malignant neoplasm of prostate: Secondary | ICD-10-CM

## 2016-11-19 LAB — PSA: PSA: 6.51

## 2016-11-25 ENCOUNTER — Encounter (HOSPITAL_COMMUNITY)
Admission: RE | Admit: 2016-11-25 | Discharge: 2016-11-25 | Disposition: A | Discharge status: Home / Self Care | Payer: 59 | Source: Ambulatory Visit | Attending: Urology | Admitting: Urology

## 2016-11-25 ENCOUNTER — Ambulatory Visit (HOSPITAL_COMMUNITY)
Admission: RE | Admit: 2016-11-25 | Discharge: 2016-11-25 | Disposition: A | Discharge status: Home / Self Care | Payer: 59 | Source: Ambulatory Visit | Attending: Urology | Admitting: Urology

## 2016-11-25 DIAGNOSIS — C61 Malignant neoplasm of prostate: Secondary | ICD-10-CM | POA: Diagnosis not present

## 2016-11-25 MED ORDER — TECHNETIUM TC 99M MEDRONATE IV KIT
25.0000 | PACK | Freq: Once | INTRAVENOUS | Status: AC | PRN
  Administered 2016-11-25: 18.6 via INTRAVENOUS

## 2017-02-13 ENCOUNTER — Other Ambulatory Visit: Payer: Self-pay | Admitting: Internal Medicine

## 2017-02-13 DIAGNOSIS — J4520 Mild intermittent asthma, uncomplicated: Secondary | ICD-10-CM

## 2017-03-11 ENCOUNTER — Other Ambulatory Visit: Payer: Self-pay | Admitting: Internal Medicine

## 2017-03-11 DIAGNOSIS — Z9109 Other allergy status, other than to drugs and biological substances: Secondary | ICD-10-CM

## 2017-03-11 DIAGNOSIS — Z0000 Encounter for general adult medical examination without abnormal findings: Secondary | ICD-10-CM

## 2017-03-15 ENCOUNTER — Other Ambulatory Visit: Payer: Self-pay | Admitting: Internal Medicine

## 2017-03-31 LAB — PSA: PSA: 11.3

## 2017-04-14 ENCOUNTER — Encounter: Payer: Self-pay | Admitting: Internal Medicine

## 2017-04-14 ENCOUNTER — Other Ambulatory Visit: Payer: Self-pay | Admitting: Internal Medicine

## 2017-04-14 DIAGNOSIS — I10 Essential (primary) hypertension: Secondary | ICD-10-CM

## 2017-05-13 ENCOUNTER — Other Ambulatory Visit: Payer: Self-pay | Admitting: Internal Medicine

## 2017-05-13 DIAGNOSIS — I10 Essential (primary) hypertension: Secondary | ICD-10-CM

## 2017-05-19 ENCOUNTER — Other Ambulatory Visit: Payer: Self-pay | Admitting: Internal Medicine

## 2017-05-19 DIAGNOSIS — J4520 Mild intermittent asthma, uncomplicated: Secondary | ICD-10-CM

## 2017-06-11 ENCOUNTER — Other Ambulatory Visit: Payer: Self-pay | Admitting: Internal Medicine

## 2017-06-11 DIAGNOSIS — I10 Essential (primary) hypertension: Secondary | ICD-10-CM

## 2017-06-11 NOTE — Telephone Encounter (Signed)
Pt needs a follow up appt in 6 months for any additional refills.

## 2017-06-12 ENCOUNTER — Other Ambulatory Visit: Payer: Self-pay | Admitting: Internal Medicine

## 2017-06-12 DIAGNOSIS — I10 Essential (primary) hypertension: Secondary | ICD-10-CM

## 2017-06-14 NOTE — Telephone Encounter (Signed)
Appt made in [place] for annual physical

## 2017-07-09 ENCOUNTER — Other Ambulatory Visit: Payer: Self-pay | Admitting: Internal Medicine

## 2017-07-09 DIAGNOSIS — I10 Essential (primary) hypertension: Secondary | ICD-10-CM

## 2017-07-12 ENCOUNTER — Telehealth: Payer: Self-pay | Admitting: Internal Medicine

## 2017-07-12 NOTE — Telephone Encounter (Signed)
Copied from [place] [id number]. Topic: Quick Communication - See Telephone Encounter >> Jul 12, 2017  4:23 PM Ether Griffins B wrote: CRM for notification. See Telephone encounter for:  Pt had contacted pharmacy about refills and was told he needs an appt he has his physical set up for 09/02/17 and would like to know if he can get 2 months of refills until appt. Needing Lipitor, bystolic, and valsartan  41/[birth year].

## 2017-07-13 ENCOUNTER — Other Ambulatory Visit: Payer: Self-pay | Admitting: Internal Medicine

## 2017-07-13 DIAGNOSIS — E785 Hyperlipidemia, unspecified: Secondary | ICD-10-CM

## 2017-07-13 DIAGNOSIS — I10 Essential (primary) hypertension: Secondary | ICD-10-CM

## 2017-07-13 MED ORDER — VALSARTAN-HYDROCHLOROTHIAZIDE 160-12.5 MG PO TABS: ORAL_TABLET | ORAL | 1 refills | Status: DC

## 2017-07-13 MED ORDER — ATORVASTATIN CALCIUM 20 MG PO TABS: 20.0000 mg | ORAL_TABLET | Freq: Every day | ORAL | 1 refills | Status: DC

## 2017-07-13 MED ORDER — NEBIVOLOL HCL 5 MG PO TABS: 5.0000 mg | ORAL_TABLET | Freq: Every day | ORAL | 1 refills | Status: DC

## 2017-07-13 NOTE — Telephone Encounter (Signed)
Prescriptions sent

## 2017-09-01 ENCOUNTER — Encounter: Payer: 59 | Admitting: Internal Medicine

## 2017-09-02 ENCOUNTER — Encounter: Payer: Self-pay | Admitting: Internal Medicine

## 2017-09-02 ENCOUNTER — Other Ambulatory Visit (INDEPENDENT_AMBULATORY_CARE_PROVIDER_SITE_OTHER): Payer: BLUE CROSS/BLUE SHIELD

## 2017-09-02 ENCOUNTER — Ambulatory Visit (INDEPENDENT_AMBULATORY_CARE_PROVIDER_SITE_OTHER): Payer: BLUE CROSS/BLUE SHIELD | Admitting: Internal Medicine

## 2017-09-02 VITALS — BP 134/80 | HR 74 | Temp 99.1°F | Resp 16 | Ht 69.0 in | Wt 216.0 lb

## 2017-09-02 DIAGNOSIS — Z0000 Encounter for general adult medical examination without abnormal findings: Secondary | ICD-10-CM | POA: Diagnosis not present

## 2017-09-02 DIAGNOSIS — E785 Hyperlipidemia, unspecified: Secondary | ICD-10-CM | POA: Diagnosis not present

## 2017-09-02 DIAGNOSIS — I10 Essential (primary) hypertension: Secondary | ICD-10-CM

## 2017-09-02 DIAGNOSIS — J4520 Mild intermittent asthma, uncomplicated: Secondary | ICD-10-CM

## 2017-09-02 DIAGNOSIS — Z8546 Personal history of malignant neoplasm of prostate: Secondary | ICD-10-CM

## 2017-09-02 DIAGNOSIS — R7309 Other abnormal glucose: Secondary | ICD-10-CM

## 2017-09-02 LAB — CBC WITH DIFFERENTIAL/PLATELET
Basophils Absolute: 0 10*3/uL (ref 0.0–0.1)
Basophils Relative: 0.9 % (ref 0.0–3.0)
Eosinophils Absolute: 0.1 10*3/uL (ref 0.0–0.7)
Eosinophils Relative: 1.5 % (ref 0.0–5.0)
HCT: 37.4 % — ABNORMAL LOW (ref 39.0–52.0)
Hemoglobin: 13.1 g/dL (ref 13.0–17.0)
Lymphocytes Relative: 23.8 % (ref 12.0–46.0)
Lymphs Abs: 1.3 10*3/uL (ref 0.7–4.0)
MCHC: 34.9 g/dL (ref 30.0–36.0)
MCV: 97.8 fl (ref 78.0–100.0)
Monocytes Absolute: 0.6 10*3/uL (ref 0.1–1.0)
Monocytes Relative: 12 % (ref 3.0–12.0)
Neutro Abs: 3.3 10*3/uL (ref 1.4–7.7)
Neutrophils Relative %: 61.8 % (ref 43.0–77.0)
Platelets: 181 10*3/uL (ref 150.0–400.0)
RBC: 3.82 Mil/uL — ABNORMAL LOW (ref 4.22–5.81)
RDW: 13.7 % (ref 11.5–15.5)
WBC: 5.4 10*3/uL (ref 4.0–10.5)

## 2017-09-02 LAB — LIPID PANEL
Cholesterol: 162 mg/dL (ref 0–200)
HDL: 93.9 mg/dL (ref >39.00)
LDL Cholesterol: 59 mg/dL (ref 0–99)
NonHDL: 67.62
Total CHOL/HDL Ratio: 2
Triglycerides: 42 mg/dL (ref 0.0–149.0)
VLDL: 8.4 mg/dL (ref 0.0–40.0)

## 2017-09-02 LAB — COMPREHENSIVE METABOLIC PANEL
ALT: 274 U/L — ABNORMAL HIGH (ref 0–53)
AST: 149 U/L — ABNORMAL HIGH (ref 0–37)
Albumin: 4.3 g/dL (ref 3.5–5.2)
Alkaline Phosphatase: 52 U/L (ref 39–117)
BUN: 24 mg/dL — ABNORMAL HIGH (ref 6–23)
CO2: 28 mEq/L (ref 19–32)
Calcium: 9.3 mg/dL (ref 8.4–10.5)
Chloride: 94 mEq/L — ABNORMAL LOW (ref 96–112)
Creatinine, Ser: 1.09 mg/dL (ref 0.40–1.50)
GFR: 72.48 mL/min (ref >60.00)
Glucose, Bld: 111 mg/dL — ABNORMAL HIGH (ref 70–99)
Potassium: 3.7 mEq/L (ref 3.5–5.1)
Sodium: 131 mEq/L — ABNORMAL LOW (ref 135–145)
Total Bilirubin: 1 mg/dL (ref 0.2–1.2)
Total Protein: 6.8 g/dL (ref 6.0–8.3)

## 2017-09-02 LAB — HEMOGLOBIN A1C: Hgb A1c MFr Bld: 5.7 % (ref 4.6–6.5)

## 2017-09-02 LAB — TSH: TSH: 0.63 u[IU]/mL (ref 0.35–4.50)

## 2017-09-02 NOTE — Patient Instructions (Signed)

## 2017-09-02 NOTE — Progress Notes (Signed)
Subjective:  Patient ID: [name] Holder, male    DOB: [birth year]  Age: 62 y.o. MRN: [id number]  CC: Hypertension; Hyperlipidemia; and Annual Exam   HPI [name] Holder presents for a CPX.  He is undergoing treatment for metastatic prostate cancer and is on Lupron, Zytiga, and prednisone.  He complains of fatigue, weight gain, hot flashes, and leg cramps.  He denies chest pain, shortness of breath, palpitations, or edema.  He also denies abdominal pain, loss of appetite, nausea, vomiting, or icterus.  Outpatient Medications Prior to Visit  Medication Sig Dispense Refill  . abiraterone acetate (ZYTIGA) 250 MG tablet   10  . azelastine (ASTELIN) 0.1 % nasal spray USE 1 TO 2 SPRAYS IN EACH NOSTRIL EVERY 12 HOURS 30 mL 5  . BREO ELLIPTA 200-25 MCG/INH AEPB Inhale 1 puff into the lungs daily. 60 each 5  . CALCIUM-VITAMIN D PO Take 1 tablet by mouth 2 (two) times daily.    . diclofenac sodium (VOLTAREN) 1 % GEL     . Leuprolide Acetate (LUPRON IJ) Inject 1 Dose as directed every 4 (four) months. This is administered by [facility]    . mometasone (NASONEX) 50 MCG/ACT nasal spray USE 1 TO 2 SPRAYS IN EACH NOSTRIL ONCE DAILY. 17 g 11  . predniSONE (DELTASONE) 5 MG tablet   4  . atorvastatin (LIPITOR) 20 MG tablet Take 1 tablet (20 mg total) by mouth daily. 30 tablet 1  . nebivolol (BYSTOLIC) 5 MG tablet Take 1 tablet (5 mg total) by mouth daily. 30 tablet 1  . PROAIR HFA 108 (90 Base) MCG/ACT inhaler 2 PUFFS EVERY 6 HOURS AS NEEDED FOR WHEEZING 8.5 g 5  . valsartan-hydrochlorothiazide (DIOVAN-HCT) 160-12.5 MG tablet TAKE (1) TABLET DAILY IN THE MORNING. 30 tablet 1   No facility-administered medications prior to visit.     ROS Review of Systems  Constitutional: Positive for fatigue and unexpected weight change. Negative for activity change, appetite change, chills, diaphoresis and fever.  HENT: Negative.  Negative for trouble swallowing.   Eyes: Negative for visual disturbance.    Respiratory: Negative.  Negative for cough, chest tightness, shortness of breath and wheezing.   Cardiovascular: Negative for chest pain, palpitations and leg swelling.  Gastrointestinal: Negative for abdominal pain, constipation, diarrhea, nausea and vomiting.  Endocrine: Negative.   Genitourinary: Negative.  Negative for difficulty urinating, dysuria, testicular pain and urgency.  Musculoskeletal: Positive for myalgias. Negative for back pain and neck stiffness.  Skin: Negative.  Negative for color change and pallor.  Allergic/Immunologic: Negative.   Neurological: Negative.  Negative for weakness and light-headedness.  Hematological: Negative for adenopathy. Does not bruise/bleed easily.  Psychiatric/Behavioral: Negative.     Objective:  BP 134/80 (BP Location: Left Arm, Patient Position: Sitting, Cuff Size: Normal)   Pulse 74   Temp 99.1 F (37.3 C) (Oral)   Resp 16   Ht 5\' 9"  (1.753 m)   Wt 216 lb 0.2 oz (98 kg)   SpO2 97%   BMI 31.90 kg/m   BP Readings from Last 3 Encounters:  09/02/17 134/80  08/31/16 (!) 144/80  08/29/15 112/78    Wt Readings from Last 3 Encounters:  09/02/17 216 lb 0.2 oz (98 kg)  08/31/16 211 lb 4 oz (95.8 kg)  08/29/15 215 lb (97.5 kg)    Physical Exam  Constitutional: He is oriented to person, place, and time. No distress.  HENT:  Mouth/Throat: Oropharynx is clear and moist. No oropharyngeal exudate.  Eyes: Conjunctivae are  normal. Left eye exhibits no discharge. No scleral icterus.  Neck: Normal range of motion. Neck supple. No JVD present. No thyromegaly present.  Cardiovascular: Normal rate, regular rhythm and normal heart sounds.  No murmur heard. Pulmonary/Chest: Effort normal and breath sounds normal. No respiratory distress. He has no wheezes. He has no rales.  Abdominal: Soft. Bowel sounds are normal. He exhibits no distension and no mass. There is no tenderness. There is no guarding.  Musculoskeletal: Normal range of motion. He  exhibits no edema, tenderness or deformity.  Lymphadenopathy:    He has no cervical adenopathy.  Neurological: He is alert and oriented to person, place, and time.  Skin: Skin is warm and dry. No rash noted. He is not diaphoretic. No erythema. No pallor.  Vitals reviewed.   Lab Results  Component Value Date   WBC 5.4 09/02/2017   HGB 13.1 09/02/2017   HCT 37.4 (L) 09/02/2017   PLT 181.0 09/02/2017   GLUCOSE 111 (H) 09/02/2017   CHOL 162 09/02/2017   TRIG 42.0 09/02/2017   HDL 93.90 09/02/2017   LDLCALC 59 09/02/2017   ALT 274 (H) 09/02/2017   AST 149 (H) 09/02/2017   NA 131 (L) 09/02/2017   K 3.7 09/02/2017   CL 94 (L) 09/02/2017   CREATININE 1.09 09/02/2017   BUN 24 (H) 09/02/2017   CO2 28 09/02/2017   TSH 0.63 09/02/2017   PSA 11.30 03/31/2017   INR 0.88 12/08/2012   HGBA1C 5.7 09/02/2017    Nm Bone Scan Whole Body  Result Date: 11/25/2016 CLINICAL DATA:  Prostate carcinoma EXAM: NUCLEAR MEDICINE WHOLE BODY BONE SCAN TECHNIQUE: Whole body anterior and posterior images were obtained approximately 3 hours after intravenous injection of radiopharmaceutical. RADIOPHARMACEUTICALS:  18.6 mCi Technetium-34m MDP IV COMPARISON:  October 21, 2015 FINDINGS: There is increased radiotracer uptake along the periphery of the upper left hemithorax at the level of the medial left scapula. There appear to be 2 adjacent upper ribs on the left showing focal increased uptake. This linear distribution of abnormal uptake in ribs is more likely due to traumatic etiology than neoplastic etiology. Elsewhere radiotracer uptake in the bony structures is normal. Kidneys are noted in the flank positions bilaterally. IMPRESSION: Linear increased uptake in the periphery of the left hemithorax, probably involving upper ribs on the left posterolaterally. This linear distribution of abnormal uptake is more suggestive of traumatic etiology as opposed to neoplastic etiology. Correlation with rib radiographs advised in  this regard. Elsewhere, the distribution of uptake is normal. Electronically Signed   By: Lowella Grip III M.D.   On: 11/25/2016 14:27    Assessment & Plan:   Garren was seen today for hypertension, hyperlipidemia and annual exam.  Diagnoses and all orders for this visit:  Essential hypertension- His blood pressure is well controlled but he is hyponatremic and hypochloremic so I have asked him to stop taking the thiazide diuretic.  Will continue to control his blood pressure with nebivolol and an ARB. -     Comprehensive metabolic panel; Future -     CBC with Differential/Platelet; Future -     nebivolol (BYSTOLIC) 5 MG tablet; Take 1 tablet (5 mg total) by mouth daily. -     olmesartan (BENICAR) 40 MG tablet; Take 1 tablet (40 mg total) by mouth daily.  Hyperlipidemia with target LDL less than 100- In light of his symptoms and abnormal liver enzymes I have asked him to stop taking the statin for now. -  TSH; Future  Other abnormal glucose- improvement noted -     Hemoglobin A1c; Future  History of prostate cancer  Routine general medical examination at a health care facility- Exam completed, labs reviewed, vaccines reviewed, screening for colon cancer is up-to-date, patient education material was given. -     Lipid panel; Future -     Cancel: PSA; Future  Mild intermittent asthma without complication- He is doing well on the current combination of LABA/ICS/SABA.  Will continue. -     albuterol (PROAIR HFA) 108 (90 Base) MCG/ACT inhaler; 2 PUFFS EVERY 6 HOURS AS NEEDED FOR WHEEZING   I have discontinued Linward Headland. Hamric's valsartan-hydrochlorothiazide and atorvastatin. I have also changed his PROAIR HFA to albuterol. Additionally, I am having him start on olmesartan. Lastly, I am having him maintain his CALCIUM-VITAMIN D PO, diclofenac sodium, mometasone, azelastine, BREO ELLIPTA, abiraterone acetate, predniSONE, Leuprolide Acetate (LUPRON IJ), and nebivolol.  Meds ordered  this encounter  Medications  . nebivolol (BYSTOLIC) 5 MG tablet    Sig: Take 1 tablet (5 mg total) by mouth daily.    Dispense:  90 tablet    Refill:  1  . olmesartan (BENICAR) 40 MG tablet    Sig: Take 1 tablet (40 mg total) by mouth daily.    Dispense:  90 tablet    Refill:  1  . albuterol (PROAIR HFA) 108 (90 Base) MCG/ACT inhaler    Sig: 2 PUFFS EVERY 6 HOURS AS NEEDED FOR WHEEZING    Dispense:  8.5 g    Refill:  5     Follow-up: Return in about 6 months (around 03/02/2018).  Scarlette Calico, MD

## 2017-09-03 NOTE — Telephone Encounter (Signed)
Copied from [place] [id number]. Topic: Inquiry >> Sep 03, 2017  2:46 PM Neva Seat wrote: Pt states Dr. [name] wanted him to come back to get his labs rechecked.  We are not seeing orders in his chart.   Please call pt once labs have been placed for pt.

## 2017-09-04 ENCOUNTER — Other Ambulatory Visit: Payer: Self-pay | Admitting: Internal Medicine

## 2017-09-04 DIAGNOSIS — I10 Essential (primary) hypertension: Secondary | ICD-10-CM

## 2017-09-04 DIAGNOSIS — E785 Hyperlipidemia, unspecified: Secondary | ICD-10-CM

## 2017-09-05 ENCOUNTER — Encounter: Payer: Self-pay | Admitting: Internal Medicine

## 2017-09-05 MED ORDER — OLMESARTAN MEDOXOMIL 40 MG PO TABS: 40.0000 mg | ORAL_TABLET | Freq: Every day | ORAL | 1 refills | Status: DC

## 2017-09-05 MED ORDER — ALBUTEROL SULFATE HFA 108 (90 BASE) MCG/ACT IN AERS: INHALATION_SPRAY | RESPIRATORY_TRACT | 5 refills | Status: DC

## 2017-09-05 MED ORDER — NEBIVOLOL HCL 5 MG PO TABS: 5.0000 mg | ORAL_TABLET | Freq: Every day | ORAL | 1 refills | Status: DC

## 2017-09-06 ENCOUNTER — Other Ambulatory Visit: Payer: Self-pay | Admitting: Internal Medicine

## 2017-09-06 DIAGNOSIS — E785 Hyperlipidemia, unspecified: Secondary | ICD-10-CM

## 2017-09-07 ENCOUNTER — Telehealth: Payer: Self-pay | Admitting: Internal Medicine

## 2017-09-07 NOTE — Telephone Encounter (Signed)
Copied from [place] [id number]. Topic: Quick Communication - See Telephone Encounter >> Sep 07, 2017  1:41 PM Aurelio Brash B wrote: CRM for notification. See Telephone encounter for:  Melody from [facility] called to let Dr [name] know that the pt has signed up  for Case management care. Melody's contact number is [phone] 09/07/17.

## 2017-09-08 NOTE — Telephone Encounter (Signed)
Appointment scheduled.

## 2017-09-16 ENCOUNTER — Ambulatory Visit: Payer: BLUE CROSS/BLUE SHIELD | Admitting: Internal Medicine

## 2017-09-16 ENCOUNTER — Other Ambulatory Visit (INDEPENDENT_AMBULATORY_CARE_PROVIDER_SITE_OTHER): Payer: BLUE CROSS/BLUE SHIELD

## 2017-09-16 ENCOUNTER — Encounter: Payer: Self-pay | Admitting: Internal Medicine

## 2017-09-16 VITALS — BP 130/90 | HR 76 | Temp 98.4°F | Resp 16 | Ht 69.0 in | Wt 214.0 lb

## 2017-09-16 DIAGNOSIS — R945 Abnormal results of liver function studies: Secondary | ICD-10-CM | POA: Diagnosis not present

## 2017-09-16 DIAGNOSIS — R05 Cough: Secondary | ICD-10-CM | POA: Diagnosis not present

## 2017-09-16 DIAGNOSIS — I10 Essential (primary) hypertension: Secondary | ICD-10-CM

## 2017-09-16 DIAGNOSIS — Z0000 Encounter for general adult medical examination without abnormal findings: Secondary | ICD-10-CM

## 2017-09-16 DIAGNOSIS — J4521 Mild intermittent asthma with (acute) exacerbation: Secondary | ICD-10-CM

## 2017-09-16 DIAGNOSIS — R7989 Other specified abnormal findings of blood chemistry: Secondary | ICD-10-CM

## 2017-09-16 DIAGNOSIS — R059 Cough, unspecified: Secondary | ICD-10-CM

## 2017-09-16 DIAGNOSIS — Z9109 Other allergy status, other than to drugs and biological substances: Secondary | ICD-10-CM | POA: Diagnosis not present

## 2017-09-16 DIAGNOSIS — J301 Allergic rhinitis due to pollen: Secondary | ICD-10-CM | POA: Diagnosis not present

## 2017-09-16 LAB — COMPREHENSIVE METABOLIC PANEL
ALT: 107 U/L — ABNORMAL HIGH (ref 0–53)
AST: 49 U/L — ABNORMAL HIGH (ref 0–37)
Albumin: 4 g/dL (ref 3.5–5.2)
Alkaline Phosphatase: 49 U/L (ref 39–117)
BUN: 17 mg/dL (ref 6–23)
CO2: 28 mEq/L (ref 19–32)
Calcium: 9.7 mg/dL (ref 8.4–10.5)
Chloride: 103 mEq/L (ref 96–112)
Creatinine, Ser: 0.91 mg/dL (ref 0.40–1.50)
GFR: 89.25 mL/min (ref >60.00)
Glucose, Bld: 108 mg/dL — ABNORMAL HIGH (ref 70–99)
Potassium: 4.2 mEq/L (ref 3.5–5.1)
Sodium: 137 mEq/L (ref 135–145)
Total Bilirubin: 1.2 mg/dL (ref 0.2–1.2)
Total Protein: 6.5 g/dL (ref 6.0–8.3)

## 2017-09-16 LAB — GAMMA GT: GGT: 94 U/L — ABNORMAL HIGH (ref 7–51)

## 2017-09-16 LAB — POCT EXHALED NITRIC OXIDE: FeNO level (ppb): 33

## 2017-09-16 MED ORDER — MOMETASONE FUROATE 50 MCG/ACT NA SUSP: NASAL | 5 refills | Status: DC

## 2017-09-16 MED ORDER — BUDESONIDE-FORMOTEROL FUMARATE 160-4.5 MCG/ACT IN AERO: 2.0000 | INHALATION_SPRAY | Freq: Two times a day (BID) | RESPIRATORY_TRACT | 1 refills | Status: DC

## 2017-09-16 MED ORDER — AZELASTINE HCL 0.1 % NA SOLN: NASAL | 5 refills | Status: DC

## 2017-09-16 NOTE — Patient Instructions (Signed)
Cough, Adult  Coughing is a reflex that clears your throat and your airways. Coughing helps to heal and protect your lungs. It is normal to cough occasionally, but a cough that happens with other symptoms or lasts a long time may be a sign of a condition that needs treatment. A cough may last only 2-3 weeks (acute), or it may last longer than 8 weeks (chronic).  What are the causes?  Coughing is commonly caused by:   Breathing in substances that irritate your lungs.   A viral or bacterial respiratory infection.   Allergies.   Asthma.   Postnasal drip.   Smoking.   Acid backing up from the stomach into the esophagus (gastroesophageal reflux).   Certain medicines.   Chronic lung problems, including COPD (or rarely, lung cancer).   Other medical conditions such as heart failure.    Follow these instructions at home:  Pay attention to any changes in your symptoms. Take these actions to help with your discomfort:   Take medicines only as told by your health care provider.  ? If you were prescribed an antibiotic medicine, take it as told by your health care provider. Do not stop taking the antibiotic even if you start to feel better.  ? Talk with your health care provider before you take a cough suppressant medicine.   Drink enough fluid to keep your urine clear or pale yellow.   If the air is dry, use a cold steam vaporizer or humidifier in your bedroom or your home to help loosen secretions.   Avoid anything that causes you to cough at work or at home.   If your cough is worse at night, try sleeping in a semi-upright position.   Avoid cigarette smoke. If you smoke, quit smoking. If you need help quitting, ask your health care provider.   Avoid caffeine.   Avoid alcohol.   Rest as needed.    Contact a health care provider if:   You have new symptoms.   You cough up pus.   Your cough does not get better after 2-3 weeks, or your cough gets worse.   You cannot control your cough with suppressant  medicines and you are losing sleep.   You develop pain that is getting worse or pain that is not controlled with pain medicines.   You have a fever.   You have unexplained weight loss.   You have night sweats.  Get help right away if:   You cough up blood.   You have difficulty breathing.   Your heartbeat is very fast.  This information is not intended to replace advice given to you by your health care provider. Make sure you discuss any questions you have with your health care provider.  Document Released: 01/09/2011 Document Revised: 12/19/2015 Document Reviewed: 09/19/2014  [employer]vier Interactive Patient Education  2018 [employer]

## 2017-09-16 NOTE — Progress Notes (Signed)
Subjective:  Patient ID: [name] Holder, male    DOB: 1955/08/05  Age: 62 y.o. MRN: [id number]  CC: Cough and Hypertension    HPI [name] Holder presents for f/up - He complains of a several day history of NP cough but he denies fever, chills, shortness of breath, or wheezing.  He is using Breo for the asthma but thinks that Symbicort works better and he wants to change back to Symbicort.  When I last saw him he had low sodium and chloride levels as well as elevated liver enzymes.  He now tells me that he was drinking about 2 or 3 alcoholic beverages a day.  He has cut down to about 1 or 2.  He also stopped taking the thiazide diuretic and has been controlling his blood pressure with the ARB.  Outpatient Medications Prior to Visit  Medication Sig Dispense Refill  . abiraterone acetate (ZYTIGA) 250 MG tablet   10  . albuterol (PROAIR HFA) 108 (90 Base) MCG/ACT inhaler 2 PUFFS EVERY 6 HOURS AS NEEDED FOR WHEEZING 8.5 g 5  . CALCIUM-VITAMIN D PO Take 1 tablet by mouth 2 (two) times daily.    . diclofenac sodium (VOLTAREN) 1 % GEL     . Leuprolide Acetate (LUPRON IJ) Inject 1 Dose as directed every 4 (four) months. This is administered by [facility]    . nebivolol (BYSTOLIC) 5 MG tablet Take 1 tablet (5 mg total) by mouth daily. 90 tablet 1  . olmesartan (BENICAR) 40 MG tablet Take 1 tablet (40 mg total) by mouth daily. 90 tablet 1  . predniSONE (DELTASONE) 5 MG tablet   4  . azelastine (ASTELIN) 0.1 % nasal spray USE 1 TO 2 SPRAYS IN EACH NOSTRIL EVERY 12 HOURS 30 mL 5  . BREO ELLIPTA 200-25 MCG/INH AEPB Inhale 1 puff into the lungs daily. 60 each 5  . mometasone (NASONEX) 50 MCG/ACT nasal spray USE 1 TO 2 SPRAYS IN EACH NOSTRIL ONCE DAILY. 17 g 11   No facility-administered medications prior to visit.     ROS Review of Systems  Constitutional: Negative for activity change, appetite change, diaphoresis, fatigue and unexpected weight change.  HENT: Positive for congestion,  postnasal drip and rhinorrhea. Negative for facial swelling, nosebleeds, sinus pressure, sinus pain, sore throat and trouble swallowing.   Respiratory: Positive for cough. Negative for choking, chest tightness, shortness of breath and wheezing.   Cardiovascular: Negative for chest pain, palpitations and leg swelling.  Gastrointestinal: Negative for abdominal pain, constipation, diarrhea, nausea and vomiting.  Endocrine: Negative.   Genitourinary: Negative.  Negative for difficulty urinating and urgency.  Musculoskeletal: Negative.  Negative for arthralgias, back pain, myalgias and neck pain.  Skin: Negative.  Negative for color change, pallor and rash.  Allergic/Immunologic: Negative.   Neurological: Negative.  Negative for dizziness, speech difficulty, weakness and light-headedness.  Hematological: Negative for adenopathy. Does not bruise/bleed easily.  Psychiatric/Behavioral: Negative.     Objective:  BP 130/90 (BP Location: Left Arm, Patient Position: Sitting, Cuff Size: Large)   Pulse 76   Temp 98.4 F (36.9 C) (Oral)   Resp 16   Ht 5\' 9"  (1.753 m)   Wt 214 lb (97.1 kg)   SpO2 100%   BMI 31.60 kg/m   BP Readings from Last 3 Encounters:  09/16/17 130/90  09/02/17 134/80  08/31/16 (!) 144/80    Wt Readings from Last 3 Encounters:  09/16/17 214 lb (97.1 kg)  09/02/17 216 lb 0.2 oz (98 kg)  08/31/16 211 lb 4 oz (95.8 kg)    Physical Exam  Constitutional: He is oriented to person, place, and time. No distress.  HENT:  Mouth/Throat: Oropharynx is clear and moist. No oropharyngeal exudate.  Eyes: Conjunctivae are normal. Left eye exhibits no discharge. No scleral icterus.  Neck: Normal range of motion. Neck supple. No JVD present. No thyromegaly present.  Cardiovascular: Normal rate, regular rhythm and normal heart sounds. Exam reveals no gallop.  No murmur heard. Pulmonary/Chest: Effort normal and breath sounds normal. No respiratory distress. He has no wheezes. He has no  rales.  Abdominal: Soft. Bowel sounds are normal. He exhibits no distension and no mass. There is no hepatosplenomegaly, splenomegaly or hepatomegaly. There is no tenderness. There is no guarding and no CVA tenderness.  Musculoskeletal: Normal range of motion. He exhibits no edema or tenderness.  Lymphadenopathy:    He has no cervical adenopathy.  Neurological: He is alert and oriented to person, place, and time.  Skin: Skin is warm and dry. No rash noted. He is not diaphoretic. No erythema. No pallor.  Vitals reviewed.   Lab Results  Component Value Date   WBC 5.4 09/02/2017   HGB 13.1 09/02/2017   HCT 37.4 (L) 09/02/2017   PLT 181.0 09/02/2017   GLUCOSE 108 (H) 09/16/2017   CHOL 162 09/02/2017   TRIG 42.0 09/02/2017   HDL 93.90 09/02/2017   LDLCALC 59 09/02/2017   ALT 107 (H) 09/16/2017   AST 49 (H) 09/16/2017   NA 137 09/16/2017   K 4.2 09/16/2017   CL 103 09/16/2017   CREATININE 0.91 09/16/2017   BUN 17 09/16/2017   CO2 28 09/16/2017   TSH 0.63 09/02/2017   PSA 11.30 03/31/2017   INR 0.88 12/08/2012   HGBA1C 5.7 09/02/2017    Nm Bone Scan Whole Body  Result Date: 11/25/2016 CLINICAL DATA:  Prostate carcinoma EXAM: NUCLEAR MEDICINE WHOLE BODY BONE SCAN TECHNIQUE: Whole body anterior and posterior images were obtained approximately 3 hours after intravenous injection of radiopharmaceutical. RADIOPHARMACEUTICALS:  18.6 mCi Technetium-27m MDP IV COMPARISON:  October 21, 2015 FINDINGS: There is increased radiotracer uptake along the periphery of the upper left hemithorax at the level of the medial left scapula. There appear to be 2 adjacent upper ribs on the left showing focal increased uptake. This linear distribution of abnormal uptake in ribs is more likely due to traumatic etiology than neoplastic etiology. Elsewhere radiotracer uptake in the bony structures is normal. Kidneys are noted in the flank positions bilaterally. IMPRESSION: Linear increased uptake in the periphery of  the left hemithorax, probably involving upper ribs on the left posterolaterally. This linear distribution of abnormal uptake is more suggestive of traumatic etiology as opposed to neoplastic etiology. Correlation with rib radiographs advised in this regard. Elsewhere, the distribution of uptake is normal. Electronically Signed   By: Lowella Grip III M.D.   On: 11/25/2016 14:27    Assessment & Plan:   [name] Holder was seen today for cough and hypertension.  Diagnoses and all orders for this visit:  Seasonal allergic rhinitis due to pollen  Environmental allergies -     azelastine (ASTELIN) 0.1 % nasal spray; USE 1 TO 2 SPRAYS IN EACH NOSTRIL EVERY 12 HOURS -     mometasone (NASONEX) 50 MCG/ACT nasal spray; USE 1 TO 2 SPRAYS IN EACH NOSTRIL ONCE DAILY.  Routine general medical examination at a health care facility -     azelastine (ASTELIN) 0.1 % nasal spray; USE 1 TO 2 SPRAYS  IN EACH NOSTRIL EVERY 12 HOURS -     mometasone (NASONEX) 50 MCG/ACT nasal spray; USE 1 TO 2 SPRAYS IN EACH NOSTRIL ONCE DAILY.  Essential hypertension-his blood pressure is well controlled.  Electrolytes and renal function are normal. -     Comprehensive metabolic panel; Future  Elevated LFTs- His LFTs are lower with less alcohol intake and his GGT is mildly elevated which indicates that the liver enzyme elevation is most likely related to alcohol intake.  Screening for acute viral hepatitis is negative.  Labs to screen for autoimmune or drug-induced hepatitis are pending. -     Comprehensive metabolic panel; Future -     Hepatitis C antibody; Future -     Hepatitis panel, acute; Future -     Gamma GT; Future -     Mitochondrial/smooth muscle ab pnl; Future -     AntiMicrosomal Ab-Liver / Kidney; Future  Mild intermittent asthma with acute exacerbation -     budesonide-formoterol (SYMBICORT) 160-4.5 MCG/ACT inhaler; Inhale 2 puffs into the lungs 2 (two) times daily.  Cough- His FeNO score is not significantly  elevated which is reassuring that he is not having an exacerbation of asthma.  His symptoms are consistent with a viral URI and he tells me they are not severe enough to require treatment with a prescription. -     POCT EXHALED NITRIC OXIDE   I have discontinued Linward Headland. Spinella's BREO ELLIPTA. I am also having him start on budesonide-formoterol. Additionally, I am having him maintain his CALCIUM-VITAMIN D PO, diclofenac sodium, abiraterone acetate, predniSONE, Leuprolide Acetate (LUPRON IJ), nebivolol, olmesartan, albuterol, azelastine, and mometasone.  Meds ordered this encounter  Medications  . azelastine (ASTELIN) 0.1 % nasal spray    Sig: USE 1 TO 2 SPRAYS IN EACH NOSTRIL EVERY 12 HOURS    Dispense:  30 mL    Refill:  5  . mometasone (NASONEX) 50 MCG/ACT nasal spray    Sig: USE 1 TO 2 SPRAYS IN EACH NOSTRIL ONCE DAILY.    Dispense:  17 g    Refill:  5  . budesonide-formoterol (SYMBICORT) 160-4.5 MCG/ACT inhaler    Sig: Inhale 2 puffs into the lungs 2 (two) times daily.    Dispense:  3 Inhaler    Refill:  1     Follow-up: Return in about 3 weeks (around 10/07/2017).  Scarlette Calico, MD

## 2017-09-17 ENCOUNTER — Encounter: Payer: Self-pay | Admitting: Internal Medicine

## 2017-09-18 LAB — MITOCHONDRIAL/SMOOTH MUSCLE AB PNL
Mitochondrial Ab: <20 Units (ref 0.0–20.0)
Smooth Muscle Ab: 3 Units (ref 0–19)

## 2017-09-20 LAB — HEPATITIS PANEL, ACUTE
Hep A IgM: NONREACTIVE
Hep B C IgM: NONREACTIVE
Hepatitis B Surface Ag: NONREACTIVE
Hepatitis C Ab: NONREACTIVE
SIGNAL TO CUT-OFF: 0.01 (ref <1.00)

## 2017-09-20 LAB — ANTI-MICROSOMAL ANTIBODY LIVER / KIDNEY: LKM1 Ab: <=20 U (ref <=20.0)

## 2017-10-20 ENCOUNTER — Other Ambulatory Visit: Payer: Self-pay | Admitting: Urology

## 2017-10-20 DIAGNOSIS — Z79899 Other long term (current) drug therapy: Secondary | ICD-10-CM

## 2017-11-23 LAB — HM COLONOSCOPY

## 2017-12-13 ENCOUNTER — Encounter: Payer: Self-pay | Admitting: Internal Medicine

## 2017-12-15 ENCOUNTER — Other Ambulatory Visit: Payer: Self-pay | Admitting: Urology

## 2017-12-15 DIAGNOSIS — C61 Malignant neoplasm of prostate: Secondary | ICD-10-CM

## 2017-12-15 DIAGNOSIS — M8580 Other specified disorders of bone density and structure, unspecified site: Secondary | ICD-10-CM

## 2017-12-15 DIAGNOSIS — Z79899 Other long term (current) drug therapy: Secondary | ICD-10-CM

## 2017-12-16 ENCOUNTER — Telehealth: Payer: Self-pay

## 2017-12-16 NOTE — Telephone Encounter (Signed)
Key: [id number]

## 2017-12-17 ENCOUNTER — Other Ambulatory Visit: Payer: Self-pay | Admitting: Internal Medicine

## 2017-12-17 ENCOUNTER — Ambulatory Visit
Admission: RE | Admit: 2017-12-17 | Discharge: 2017-12-17 | Disposition: A | Discharge status: Home / Self Care | Payer: BLUE CROSS/BLUE SHIELD | Source: Ambulatory Visit | Attending: Urology | Admitting: Urology

## 2017-12-17 DIAGNOSIS — Z79899 Other long term (current) drug therapy: Secondary | ICD-10-CM

## 2017-12-17 DIAGNOSIS — M8580 Other specified disorders of bone density and structure, unspecified site: Secondary | ICD-10-CM

## 2017-12-17 DIAGNOSIS — C61 Malignant neoplasm of prostate: Secondary | ICD-10-CM

## 2017-12-17 DIAGNOSIS — I10 Essential (primary) hypertension: Secondary | ICD-10-CM

## 2017-12-17 LAB — PSA: PSA: 0.97

## 2017-12-17 MED ORDER — CARVEDILOL 3.125 MG PO TABS: 3.1250 mg | ORAL_TABLET | Freq: Two times a day (BID) | ORAL | 1 refills | Status: DC

## 2017-12-17 NOTE — Telephone Encounter (Signed)
Changed RX sent 

## 2017-12-17 NOTE — Telephone Encounter (Signed)
PA was denied. Is there an alternative for Bystolic?

## 2017-12-17 NOTE — Telephone Encounter (Signed)
Pt informed of rx change.  

## 2018-02-21 ENCOUNTER — Other Ambulatory Visit: Payer: Self-pay | Admitting: Internal Medicine

## 2018-02-21 DIAGNOSIS — I10 Essential (primary) hypertension: Secondary | ICD-10-CM

## 2018-03-09 ENCOUNTER — Other Ambulatory Visit: Payer: Self-pay | Admitting: Internal Medicine

## 2018-03-09 DIAGNOSIS — J4520 Mild intermittent asthma, uncomplicated: Secondary | ICD-10-CM

## 2018-03-11 ENCOUNTER — Other Ambulatory Visit: Payer: Self-pay | Admitting: Internal Medicine

## 2018-03-11 DIAGNOSIS — J4521 Mild intermittent asthma with (acute) exacerbation: Secondary | ICD-10-CM

## 2018-03-31 ENCOUNTER — Other Ambulatory Visit: Payer: Self-pay

## 2018-03-31 ENCOUNTER — Encounter: Payer: Self-pay | Admitting: Physical Therapy

## 2018-03-31 ENCOUNTER — Ambulatory Visit: Payer: BLUE CROSS/BLUE SHIELD | Attending: Orthopedic Surgery | Admitting: Physical Therapy

## 2018-03-31 DIAGNOSIS — M545 Low back pain: Secondary | ICD-10-CM | POA: Diagnosis not present

## 2018-03-31 DIAGNOSIS — G8929 Other chronic pain: Secondary | ICD-10-CM | POA: Insufficient documentation

## 2018-03-31 DIAGNOSIS — M6281 Muscle weakness (generalized): Secondary | ICD-10-CM

## 2018-03-31 NOTE — Therapy (Signed)
[facility] [place], [place], [address] Phone: [phone]   Fax:  [phone]  Physical Therapy Evaluation  Patient Details  Name: [name] Holder MRN: [id number] Date of Birth: Sep 30, 1955 Referring Provider: Phylliss Bob MD   Encounter Date: 03/31/2018  PT End of Session - 03/31/18 1006    Visit Number  1    Number of Visits  13    PT Start Time  0945    PT Stop Time  1030    PT Time Calculation (min)  45 min    Activity Tolerance  Patient tolerated treatment well    Behavior During Therapy  [facility] for tasks assessed/performed       Past Medical History:  Diagnosis Date  . Allergy   . Asthma   . Elevated PSA 10/03/2012   23.37  . History of kidney stones   . History of kidney stones   . Hx of colonic polyps   . Hypertension   . Osteopenia   . Prostate cancer [facility])     Past Surgical History:  Procedure Laterality Date  . COLONOSCOPY    . HERNIA REPAIR  ?2008  . LYMPHADENECTOMY Left 01/05/2013   Procedure: ROBOTIC LYMPHADENECTOMY;  Surgeon: Dutch Gray, MD;  Location: [facility];  Service: Urology;  Laterality: Left;  . PROSTATE BIOPSY     11/12 positive biopsies  . ROTATOR CUFF REPAIR  2012    There were no vitals filed for this visit.   Subjective Assessment - 03/31/18 0954    Subjective  Pt arriving to therapy reporting 3/10 low back pain at present and reporting 5-6/10 pain in the evening when he is walking his dogs. Pt reporting over the last 2-3 months his pain has increased.     Pertinent History  Prostate CA, pt currently undergoing treatment.  HTN, asthma (has inhaler),     Limitations  Lifting    Currently in Pain?  Yes    Pain Score  3     Pain Orientation  Lower    Pain Descriptors / Indicators  Aching    Pain Type  Chronic pain    Pain Onset  More than a month ago    Pain Frequency  Constant    Aggravating Factors   lifting things from the floor, standing long periods    Pain Relieving  Factors  resting         [facility] PT Assessment - 03/31/18 0001      Assessment   Medical Diagnosis  low back pain    Referring Provider  Phylliss Bob MD    Onset Date/Surgical Date  --   2-3 months ago   Hand Dominance  Right    Prior Therapy  no      Precautions   Precautions  Other (comment)    Precaution Comments  Pt is currently undergoing treatments for metastic prostate CA      Restrictions   Weight Bearing Restrictions  No      Balance Screen   Has the patient fallen in the past 6 months  No    Has the patient had a decrease in activity level because of a fear of falling?   Yes    Is the patient reluctant to leave their home because of a fear of falling?   No      Home Film/video editor residence      Prior Function   Level of Independence  Independent    Vocation  Retired      Charity fundraiser Status  Within Functional Limits for tasks assessed      Observation/Other Assessments   Focus on Therapeutic Outcomes (FOTO)   perform at next visit      Posture/Postural Control   Posture/Postural Control  Postural limitations    Postural Limitations  Rounded Shoulders;Forward head    Posture Comments  scoliosis      ROM / Strength   AROM / PROM / Strength  AROM;Strength      AROM   AROM Assessment Site  Lumbar    Lumbar Flexion  70    Lumbar Extension  25    Lumbar - Right Side Bend  20    Lumbar - Left Side Bend  24    Lumbar - Right Rotation  limited due to pain    Lumbar - Left Rotation  limited due to pain      Strength   Strength Assessment Site  Hip    Right/Left Hip  Right;Left    Right Hip Flexion  4-/5    Right Hip ABduction  4-/5    Right Hip ADduction  4-/5    Left Hip Flexion  4-/5    Left Hip ABduction  4-/5    Left Hip ADduction  4-/5      Flexibility   Soft Tissue Assessment /Muscle Length  yes    Hamstrings  R: 50 degrees, L: 46 degrees      Palpation   Palpation comment  tenderness noted in  lumbar paraspinals and along superior SI joint      Special Tests    Special Tests  Lumbar    Lumbar Tests  Straight Leg Raise      Straight Leg Raise   Findings  Negative    Side   Right    Comment  and left      Transfers   Transfers  --   independent with use of UE for support               Objective measurements completed on examination: See above findings.      [facility] Adult PT Treatment/Exercise - 03/31/18 0001      Exercises   Exercises  Lumbar      Lumbar Exercises: Stretches   Active Hamstring Stretch  2 reps;30 seconds      Lumbar Exercises: Supine   Bridge  5 reps;5 seconds    Other Supine Lumbar Exercises  PPT x 5 holding 5 seconds             PT Education - 03/31/18 1226    Education Details  HEP    Person(s) Educated  Patient    Methods  Explanation;Demonstration    Comprehension  Verbalized understanding;Returned demonstration          PT Long Term Goals - 03/31/18 1226      PT LONG TERM GOAL #1   Title  Pt will be indepedent in his HEP and progression.     Time  6    Period  Weeks    Status  New    Target Date  05/14/18      PT LONG TERM GOAL #2   Title  Pt will be able to improve his hamstring flexibiltiy to >/= 70 degrees bilaterally in order to improve functional mobility.     Time  6    Period  Weeks  Status  New    Target Date  05/14/18      PT LONG TERM GOAL #3   Title  pt will be able to demonstrate correct lifting techniques for 20 lb object.     Time  6    Period  Weeks    Status  New    Target Date  05/14/18      PT LONG TERM GOAL #4   Title  pt will report pain </= 2/10 with walking 1 mile.     Baseline  pain 6/10 when walking his dogs in the evening.     Time  6    Period  Weeks    Status  New    Target Date  05/14/18             Plan - 03/31/18 1239    Clinical Impression Statement  Pt presenting with low back pain which has worsened over the last 2-3 months. Pt with moderate right  neuroforaminal stenosis at L4-5 and 1.3 centimeter lipoma of the filum terminal, L5-S1 trace disc bulge and mild bilateral facet artrhropathy. Pt currently undergoing treatments for metastic prostate CA and reports weakness overall. Pt reporting 3/10 LBP at rest but reports it worsens throughout the day and can reach a 6/10 when walking his dog. Pt reporting difficulty with lifting objects and lacking flexibility. Skilled PT needed to progress pt toward his PLOF with decreased pain with the below interventions.     History and Personal Factors relevant to plan of care:  Metastic Prostate CA, HTN, spinal stenosis, scoliosis    Clinical Presentation  Stable    Clinical Presentation due to:  Pt currently undergoing treatment for metastic prostate CA.     Clinical Decision Making  Moderate    Rehab Potential  Good    Clinical Impairments Affecting Rehab Potential  NO E-stim or [place]    PT Frequency  2x / week    PT Duration  6 weeks    PT Treatment/Interventions  Cryotherapy;Moist Heat;Functional mobility training;Therapeutic activities;Stair training;Gait training;Therapeutic exercise;Balance training;Patient/family education;Manual techniques;Passive range of motion;Taping    PT Next Visit Plan  FOTO, Nustep, lumbar stretching, core strengthening,  review HEP    PT Home Exercise Plan  hamstring stretches, PPT, bridges    Consulted and Agree with Plan of Care  Patient       Patient will benefit from skilled therapeutic intervention in order to improve the following deficits and impairments:  Pain, Postural dysfunction, Impaired flexibility, Decreased range of motion, Difficulty walking, Decreased activity tolerance  Visit Diagnosis: Chronic bilateral low back pain without sciatica  Muscle weakness (generalized)     Problem List Patient Active Problem List   Diagnosis Date Noted  . Elevated LFTs 09/16/2017  . Routine general medical examination at a health care facility 08/29/2015  .  Hyperlipidemia with target LDL less than 100 12/21/2013  . Other abnormal glucose 12/21/2013  . Upper airway cough syndrome 07/25/2013  . History of prostate cancer   . Essential hypertension 05/20/2010  . Allergic rhinitis 05/20/2010  . Asthma, mild intermittent 05/20/2010    Oretha Caprice, MPT 03/31/2018, 12:49 PM  [facility] [place], [place], [address] Phone: [phone]   Fax:  [phone]  Name: [name] Holder MRN: [id number] Date of Birth: Nov 14, 1955

## 2018-04-04 ENCOUNTER — Encounter: Payer: Self-pay | Admitting: Physical Therapy

## 2018-04-04 ENCOUNTER — Ambulatory Visit: Payer: BLUE CROSS/BLUE SHIELD | Admitting: Physical Therapy

## 2018-04-04 DIAGNOSIS — M6281 Muscle weakness (generalized): Secondary | ICD-10-CM

## 2018-04-04 DIAGNOSIS — G8929 Other chronic pain: Secondary | ICD-10-CM

## 2018-04-04 DIAGNOSIS — M545 Low back pain: Secondary | ICD-10-CM | POA: Diagnosis not present

## 2018-04-04 NOTE — Therapy (Signed)
[facility] [place], [place], [address] Phone: [phone]   Fax:  [phone]  Physical Therapy Treatment  Patient Details  Name: [name] Holder MRN: [id number] Date of Birth: Apr 24, 1956 Referring Provider: Phylliss Bob MD   Encounter Date: 04/04/2018  PT End of Session - 04/04/18 1011    Visit Number  2    Number of Visits  13    PT Start Time  0945    PT Stop Time  1032    PT Time Calculation (min)  47 min    Activity Tolerance  Patient tolerated treatment well    Behavior During Therapy  [facility] for tasks assessed/performed       Past Medical History:  Diagnosis Date  . Allergy   . Asthma   . Elevated PSA 10/03/2012   23.37  . History of kidney stones   . History of kidney stones   . Hx of colonic polyps   . Hypertension   . Osteopenia   . Prostate cancer [facility])     Past Surgical History:  Procedure Laterality Date  . COLONOSCOPY    . HERNIA REPAIR  ?2008  . LYMPHADENECTOMY Left 01/05/2013   Procedure: ROBOTIC LYMPHADENECTOMY;  Surgeon: Dutch Gray, MD;  Location: [facility];  Service: Urology;  Laterality: Left;  . PROSTATE BIOPSY     11/12 positive biopsies  . ROTATOR CUFF REPAIR  2012    There were no vitals filed for this visit.  Subjective Assessment - 04/04/18 0952    Subjective  Patient arrives with 2/10 low back pain and stated he has been compliant with HEP over the weekend however it has increased his low back pain.    Pertinent History  Prostate CA, pt currently undergoing treatment.  HTN, asthma (has inhaler),     Limitations  Lifting    Currently in Pain?  Yes    Pain Score  2     Pain Location  Back    Pain Orientation  Mid;Lower    Pain Descriptors / Indicators  Dull    Pain Type  Chronic pain    Pain Onset  More than a month ago    Pain Frequency  Intermittent         [facility] PT Assessment - 04/04/18 0001      Assessment   Medical Diagnosis  low back pain    Hand Dominance  Right     Prior Therapy  no      Precautions   Precautions  Other (comment)    Precaution Comments  NO [place] OR E-STIM Pt is currently undergoing treatments for metastic prostate CA      Observation/Other Assessments   Focus on Therapeutic Outcomes (FOTO)   47%                   [facility] Adult PT Treatment/Exercise - 04/04/18 0001      Exercises   Exercises  Lumbar      Lumbar Exercises: Stretches   Passive Hamstring Stretch  Right;Left;3 reps;20 seconds      Lumbar Exercises: Aerobic   Nustep  Level 3 x12 minutes      Lumbar Exercises: Standing   Row  Strengthening;Both;20 reps    Row Limitations  Pink XTS    Shoulder Extension  Strengthening;Both;20 reps    Shoulder Extension Limitations  Pink XTS      Lumbar Exercises: Supine   Pelvic Tilt  10 reps;5 seconds    Clam  15 reps;3 seconds    Bridge  15 reps;5 seconds                  PT Long Term Goals - 03/31/18 1226      PT LONG TERM GOAL #1   Title  Pt will be indepedent in his HEP and progression.     Time  6    Period  Weeks    Status  New    Target Date  05/14/18      PT LONG TERM GOAL #2   Title  Pt will be able to improve his hamstring flexibiltiy to >/= 70 degrees bilaterally in order to improve functional mobility.     Time  6    Period  Weeks    Status  New    Target Date  05/14/18      PT LONG TERM GOAL #3   Title  pt will be able to demonstrate correct lifting techniques for 20 lb object.     Time  6    Period  Weeks    Status  New    Target Date  05/14/18      PT LONG TERM GOAL #4   Title  pt will report pain </= 2/10 with walking 1 mile.     Baseline  pain 6/10 when walking his dogs in the evening.     Time  6    Period  Weeks    Status  New    Target Date  05/14/18            Plan - 04/04/18 1016    Clinical Impression Statement  Patient was able to tolerate treatment well with good form with all exercises after demonstration. Patient was able to recall exercises from  HEP and was able to demonstrate good form with hamstring stretch and bridges. Patient required verbal cuing for proper form for pelvic tilts. Patient was able to demonstrate with proper form for remainder of exercise. Patient educated initial soreness in the muscles is a normal response to exercise and patient should rest and ice PRN. Patient reported understanding.    Clinical Presentation  Stable    Clinical Decision Making  Moderate    Rehab Potential  Good    Clinical Impairments Affecting Rehab Potential  NO E-stim or [place]    PT Frequency  2x / week    PT Duration  6 weeks    PT Treatment/Interventions  Cryotherapy;Moist Heat;Functional mobility training;Therapeutic activities;Stair training;Gait training;Therapeutic exercise;Balance training;Patient/family education;Manual techniques;Passive range of motion;Taping    PT Next Visit Plan  Cont with plan of care; Nustep, lumbar stretching, core strengthening,     Consulted and Agree with Plan of Care  Patient       Patient will benefit from skilled therapeutic intervention in order to improve the following deficits and impairments:  Pain, Postural dysfunction, Impaired flexibility, Decreased range of motion, Difficulty walking, Decreased activity tolerance  Visit Diagnosis: Chronic bilateral low back pain without sciatica  Muscle weakness (generalized)     Problem List Patient Active Problem List   Diagnosis Date Noted  . Elevated LFTs 09/16/2017  . Routine general medical examination at a health care facility 08/29/2015  . Hyperlipidemia with target LDL less than 100 12/21/2013  . Other abnormal glucose 12/21/2013  . Upper airway cough syndrome 07/25/2013  . History of prostate cancer   . Essential hypertension 05/20/2010  . Allergic rhinitis 05/20/2010  . Asthma, mild intermittent 05/20/2010   Torrie Mayers  Arisbeth Purrington, PT, DPT 04/04/2018, 10:35 AM  [facility] [address] [place], [place], [address] Phone: [phone]   Fax:  [phone]  Name: DEVERICK PRUSS MRN: [id number] Date of Birth: 07/02/1956

## 2018-04-07 ENCOUNTER — Ambulatory Visit: Payer: BLUE CROSS/BLUE SHIELD | Admitting: Physical Therapy

## 2018-04-07 ENCOUNTER — Encounter: Payer: Self-pay | Admitting: Physical Therapy

## 2018-04-07 DIAGNOSIS — M6281 Muscle weakness (generalized): Secondary | ICD-10-CM

## 2018-04-07 DIAGNOSIS — M545 Low back pain, unspecified: Secondary | ICD-10-CM

## 2018-04-07 DIAGNOSIS — G8929 Other chronic pain: Secondary | ICD-10-CM

## 2018-04-07 NOTE — Therapy (Signed)
[facility] [place], [place], [address] Phone: [phone]   Fax:  [phone]  Physical Therapy Treatment  Patient Details  Name: [name] Holder MRN: [id number] Date of Birth: 02-21-1956 Referring Provider: Phylliss Bob MD   Encounter Date: 04/07/2018  PT End of Session - 04/07/18 1238    Visit Number  3    Number of Visits  13    PT Start Time  0945    PT Stop Time  1035    PT Time Calculation (min)  50 min    Activity Tolerance  Patient tolerated treatment well    Behavior During Therapy  [facility] for tasks assessed/performed       Past Medical History:  Diagnosis Date  . Allergy   . Asthma   . Elevated PSA 10/03/2012   23.37  . History of kidney stones   . History of kidney stones   . Hx of colonic polyps   . Hypertension   . Osteopenia   . Prostate cancer [facility])     Past Surgical History:  Procedure Laterality Date  . COLONOSCOPY    . HERNIA REPAIR  ?2008  . LYMPHADENECTOMY Left 01/05/2013   Procedure: ROBOTIC LYMPHADENECTOMY;  Surgeon: Dutch Gray, MD;  Location: [facility];  Service: Urology;  Laterality: Left;  . PROSTATE BIOPSY     11/12 positive biopsies  . ROTATOR CUFF REPAIR  2012    There were no vitals filed for this visit.  Subjective Assessment - 04/07/18 1025    Subjective  Pt arriving to therapy reporting no pain at rest. Pt still reporting 6/10 pain when he walks his dogs in the evening.     Pertinent History  Prostate CA, pt currently undergoing treatment.  HTN, asthma (has inhaler),     Limitations  Lifting    Currently in Pain?  No/denies    Aggravating Factors   walking dogs in evening, first thing in the morning    Pain Relieving Factors  resting                       [facility] Adult PT Treatment/Exercise - 04/07/18 0001      Exercises   Exercises  Lumbar      Lumbar Exercises: Stretches   Piriformis Stretch  2 reps;20 seconds    Piriformis Stretch Limitations   modified      Lumbar Exercises: Aerobic   Nustep  Level 4 x10 minutes      Lumbar Exercises: Standing   Row  Strengthening;Both;20 reps    Row Limitations  Pink XTS    Shoulder Extension  Strengthening;Both;20 reps    Shoulder Extension Limitations  Pink XTS    Other Standing Lumbar Exercises  Stadning posture with modified wall angles x 10 reps      Lumbar Exercises: Supine   Bridge  15 reps;3 seconds   feet on red therapy ball holding 3 seconds     Lumbar Exercises: Sidelying   Clam  15 reps;2 seconds                  PT Long Term Goals - 03/31/18 1226      PT LONG TERM GOAL #1   Title  Pt will be indepedent in his HEP and progression.     Time  6    Period  Weeks    Status  New    Target Date  05/14/18      PT  LONG TERM GOAL #2   Title  Pt will be able to improve his hamstring flexibiltiy to >/= 70 degrees bilaterally in order to improve functional mobility.     Time  6    Period  Weeks    Status  New    Target Date  05/14/18      PT LONG TERM GOAL #3   Title  pt will be able to demonstrate correct lifting techniques for 20 lb object.     Time  6    Period  Weeks    Status  New    Target Date  05/14/18      PT LONG TERM GOAL #4   Title  pt will report pain </= 2/10 with walking 1 mile.     Baseline  pain 6/10 when walking his dogs in the evening.     Time  6    Period  Weeks    Status  New    Target Date  05/14/18            Plan - 04/07/18 1241    Clinical Impression Statement  Pt tolerating treatment well, still reporting pain in the evening when he is walking his dogs. PT reporting no pain at rest upon arrival. Pt with some soreness noted during exericses/stretches. Working on core strengthening and lumbar stretching. Continue skilled PT as pt tolerates to progress toward goals.     Rehab Potential  Good    Clinical Impairments Affecting Rehab Potential  NO E-stim or [place]    PT Frequency  2x / week    PT Treatment/Interventions   Cryotherapy;Moist Heat;Functional mobility training;Therapeutic activities;Stair training;Gait training;Therapeutic exercise;Balance training;Patient/family education;Manual techniques;Passive range of motion;Taping    PT Next Visit Plan  Cont with plan of care; Nustep, lumbar stretching, core strengthening,     PT Home Exercise Plan  hamstring stretches, PPT, bridges, posture correction in standing    Consulted and Agree with Plan of Care  Patient       Patient will benefit from skilled therapeutic intervention in order to improve the following deficits and impairments:  Pain, Postural dysfunction, Impaired flexibility, Decreased range of motion, Difficulty walking, Decreased activity tolerance  Visit Diagnosis: Chronic bilateral low back pain without sciatica  Muscle weakness (generalized)     Problem List Patient Active Problem List   Diagnosis Date Noted  . Elevated LFTs 09/16/2017  . Routine general medical examination at a health care facility 08/29/2015  . Hyperlipidemia with target LDL less than 100 12/21/2013  . Other abnormal glucose 12/21/2013  . Upper airway cough syndrome 07/25/2013  . History of prostate cancer   . Essential hypertension 05/20/2010  . Allergic rhinitis 05/20/2010  . Asthma, mild intermittent 05/20/2010    Oretha Caprice, MPT 04/07/2018, 12:46 PM  [facility] [address], [place], [address] Phone: [phone]   Fax:  [phone]  Name: [name] Holder MRN: [id number] Date of Birth: [birth year]

## 2018-04-10 ENCOUNTER — Encounter (HOSPITAL_COMMUNITY): Payer: Self-pay | Admitting: Emergency Medicine

## 2018-04-10 ENCOUNTER — Observation Stay (HOSPITAL_COMMUNITY)
Admission: EM | Admit: 2018-04-10 | Discharge: 2018-04-12 | Disposition: A | Discharge status: Home / Self Care | Payer: BLUE CROSS/BLUE SHIELD | Attending: Family Medicine | Admitting: Family Medicine

## 2018-04-10 ENCOUNTER — Other Ambulatory Visit: Payer: Self-pay

## 2018-04-10 DIAGNOSIS — Z888 Allergy status to other drugs, medicaments and biological substances status: Secondary | ICD-10-CM | POA: Diagnosis not present

## 2018-04-10 DIAGNOSIS — J4520 Mild intermittent asthma, uncomplicated: Secondary | ICD-10-CM | POA: Insufficient documentation

## 2018-04-10 DIAGNOSIS — Z0000 Encounter for general adult medical examination without abnormal findings: Secondary | ICD-10-CM

## 2018-04-10 DIAGNOSIS — W109XXA Fall (on) (from) unspecified stairs and steps, initial encounter: Secondary | ICD-10-CM | POA: Insufficient documentation

## 2018-04-10 DIAGNOSIS — R29898 Other symptoms and signs involving the musculoskeletal system: Secondary | ICD-10-CM | POA: Diagnosis present

## 2018-04-10 DIAGNOSIS — Z87891 Personal history of nicotine dependence: Secondary | ICD-10-CM | POA: Insufficient documentation

## 2018-04-10 DIAGNOSIS — I10 Essential (primary) hypertension: Secondary | ICD-10-CM | POA: Insufficient documentation

## 2018-04-10 DIAGNOSIS — Z8546 Personal history of malignant neoplasm of prostate: Secondary | ICD-10-CM | POA: Diagnosis not present

## 2018-04-10 DIAGNOSIS — M4802 Spinal stenosis, cervical region: Secondary | ICD-10-CM | POA: Diagnosis present

## 2018-04-10 DIAGNOSIS — Z79899 Other long term (current) drug therapy: Secondary | ICD-10-CM | POA: Diagnosis not present

## 2018-04-10 DIAGNOSIS — R531 Weakness: Secondary | ICD-10-CM | POA: Diagnosis not present

## 2018-04-10 DIAGNOSIS — E872 Acidosis, unspecified: Secondary | ICD-10-CM | POA: Diagnosis present

## 2018-04-10 DIAGNOSIS — Z881 Allergy status to other antibiotic agents status: Secondary | ICD-10-CM | POA: Diagnosis not present

## 2018-04-10 DIAGNOSIS — J4521 Mild intermittent asthma with (acute) exacerbation: Secondary | ICD-10-CM

## 2018-04-10 DIAGNOSIS — R509 Fever, unspecified: Secondary | ICD-10-CM | POA: Diagnosis not present

## 2018-04-10 DIAGNOSIS — Z9109 Other allergy status, other than to drugs and biological substances: Secondary | ICD-10-CM

## 2018-04-10 MED ORDER — SODIUM CHLORIDE 0.9 % IV BOLUS
1000.0000 mL | Freq: Once | INTRAVENOUS | Status: AC
  Administered 2018-04-11: 1000 mL via INTRAVENOUS

## 2018-04-10 MED ORDER — SODIUM CHLORIDE 0.9 % IV BOLUS
500.0000 mL | Freq: Once | INTRAVENOUS | Status: AC
  Administered 2018-04-11: 500 mL via INTRAVENOUS

## 2018-04-10 NOTE — ED Triage Notes (Addendum)
Per ems pt was walking dogs today and was walking up steps and legs gave out and he was unable to get himself back up. Pt denies numbness, tingling, or loss of sensation in legs. Pt A&O x4

## 2018-04-10 NOTE — ED Provider Notes (Signed)
[facility] EMERGENCY DEPARTMENT Provider Note   CSN: [id number] Arrival date & time: 04/10/18  2247  Time seen 23:10 PM    History   Chief Complaint Chief Complaint  Patient presents with  . Fall    HPI [name] Holder is a 62 y.o. male.  HPI patient states he felt fine today.  He does wife ate dinner and after dinner they walked the dogs.  They state it is after dark and it was not hot.  They only were walking about 15 minutes.  He states their driveway goes steeply downward and then there are 3 steps to the house that are normal size.  As he was trying to walk up the steps he suddenly was unable to walk up the steps.  At some point he fell.  He denies having lightheadedness, dizziness, tripping, passing out, numbness in his legs but states both legs were extremely weak.  He called out for his wife who came out and had to help him get in the house with great difficulty.  She states he had his arms wrapped around her neck and she was having to support him into the house and with great difficulty guarding through the house into the bathroom.  She then called 911.  When EMS arrived he was able to stand but had to still have help ambulating.  The extreme weakness lasted about 30 minutes.  His wife who is a nurse said during this time.  He had normal speech, he denied numbness and he could feel her touching his legs.  He denied any arm weakness.  He denies headache.  He denies any change in urination however he has had prostate cancer and has nocturia and difficulty controlling his urination anyway.  He has had chronic back pain and had a MRI of his lumbar spine recently ordered by Dr. Lynann Bologna because he has a history of metastatic prostate cancer.  It did not show any lesions and per his wife only showed a L4/5 spondylolisthesis without prolapse dips.  He is currently doing physical therapy for his back.  He denies any change in his back discomfort.  He has never had anything like this happen  before.  He reports EMS told him his blood pressure was in the 90s when they arrived which is unusual for him.  PCP [name], MD Urology Dr Loletta Specter Dr Lynann Bologna  Past Medical History:  Diagnosis Date  . Allergy   . Asthma   . Elevated PSA 10/03/2012   23.37  . History of kidney stones   . History of kidney stones   . Hx of colonic polyps   . Hypertension   . Osteopenia   . Prostate cancer [facility])     Patient Active Problem List   Diagnosis Date Noted  . Elevated LFTs 09/16/2017  . Routine general medical examination at a health care facility 08/29/2015  . Hyperlipidemia with target LDL less than 100 12/21/2013  . Other abnormal glucose 12/21/2013  . Upper airway cough syndrome 07/25/2013  . History of prostate cancer   . Essential hypertension 05/20/2010  . Allergic rhinitis 05/20/2010  . Asthma, mild intermittent 05/20/2010    Past Surgical History:  Procedure Laterality Date  . COLONOSCOPY    . HERNIA REPAIR  ?2008  . LYMPHADENECTOMY Left 01/05/2013   Procedure: ROBOTIC LYMPHADENECTOMY;  Surgeon: Dutch Gray, MD;  Location: [facility];  Service: Urology;  Laterality: Left;  . PROSTATE BIOPSY     11/12 positive  biopsies  . ROTATOR CUFF REPAIR  2012        Home Medications    Prior to Admission medications   Medication Sig Start Date End Date Taking? Authorizing Provider  abiraterone acetate (ZYTIGA) 250 MG tablet Take 1,000 mg by mouth daily.  08/17/17  Yes [provider]  albuterol (PROAIR HFA) 108 (90 Base) MCG/ACT inhaler 2 PUFFS EVERY 6 HOURS AS NEEDED FOR WHEEZING Patient taking differently: Inhale 2 puffs into the lungs every 6 (six) hours as needed for wheezing or shortness of breath.  03/09/18  Yes [name], MD  azelastine (ASTELIN) 0.1 % nasal spray USE 1 TO 2 SPRAYS IN EACH NOSTRIL EVERY 12 HOURS Patient taking differently: Place 2 sprays into both nostrils every morning.  09/16/17  Yes [name], MD  CALCIUM-VITAMIN D PO Take  1 tablet by mouth 2 (two) times daily.   Yes [provider]  carvedilol (COREG) 3.125 MG tablet Take 1 tablet (3.125 mg total) by mouth 2 (two) times daily with a meal. 12/17/17  Yes [name], MD  diclofenac sodium (VOLTAREN) 1 % GEL Apply 2 g topically daily as needed (for pain).  06/10/16  Yes [provider]  Leuprolide Acetate (LUPRON IJ) Inject 1 Dose as directed every 4 (four) months. This is administered by [facility]   Yes [provider]  mometasone (NASONEX) 50 MCG/ACT nasal spray USE 1 TO 2 SPRAYS IN EACH NOSTRIL ONCE DAILY. Patient taking differently: Place 2 sprays into the nose daily.  09/16/17  Yes [name], MD  olmesartan (BENICAR) 40 MG tablet TAKE 1 TABLET DAILY Patient taking differently: Take 40 mg by mouth daily.  02/21/18  Yes [name], MD  predniSONE (DELTASONE) 5 MG tablet Take 5 mg by mouth 2 (two) times daily.  08/28/17  Yes [provider]  SYMBICORT 160-4.5 MCG/ACT inhaler Inhale 2 puffs into the lungs 2 (two) times daily. Patient taking differently: Inhale 2 puffs into the lungs 2 (two) times daily.  03/12/18  Yes [name], MD    Family History Family History  Problem Relation Age of Onset  . Arthritis Mother   . Diabetes Mother   . Hypertension Mother   . Hyperlipidemia Mother   . Hypertension Father   . Cancer Father        Prostate Cancer/radiation mid 56's  . Cancer Sister        precancerous colon polyps, 6 in colon removed  . Stroke Maternal Grandfather     Social History Social History   Tobacco Use  . Smoking status: Former Smoker    Packs/day: 0.50    Years: 15.00    Pack years: 7.50    Types: Cigarettes  . Smokeless tobacco: Never Used  Substance Use Topics  . Alcohol use: Yes    Alcohol/week: 2.0 standard drinks    Types: 2 Glasses of wine per week    Comment: wine  1  glasses daily  . Drug use: No  lives at home  Lives with spouse   Allergies   Amlodipine and  Tetracycline   Review of Systems Review of Systems  All other systems reviewed and are negative.    Physical Exam Updated Vital Signs BP (!) 99/57 (BP Location: Right Arm)   Pulse 86   Temp 99 F (37.2 C) (Oral)   Resp 16   Ht 5\' 9"  (1.753 m)   Wt 97.5 kg   SpO2 97%  BMI 31.75 kg/m   Vital signs normal borderline blood pressure, borderline temperature  Orthostatic VS for the past 24 hrs:  BP- Lying Pulse- Lying BP- Sitting Pulse- Sitting BP- Standing at 0 minutes Pulse- Standing at 0 minutes  04/11/18 0003 118/61 84 117/62 85 117/62 89    Orthostatic Vital signs normal     Physical Exam  Constitutional: He is oriented to person, place, and time. He appears well-developed and well-nourished.  Non-toxic appearance. He does not appear ill. No distress.  HENT:  Head: Normocephalic and atraumatic.  Right Ear: External ear normal.  Left Ear: External ear normal.  Nose: Nose normal. No mucosal edema or rhinorrhea.  Mouth/Throat: Oropharynx is clear and moist and mucous membranes are normal. No dental abscesses or uvula swelling.  Eyes: Pupils are equal, round, and reactive to light. Conjunctivae and EOM are normal.  Neck: Normal range of motion and full passive range of motion without pain. Neck supple.  Cardiovascular: Normal rate, regular rhythm and normal heart sounds. Exam reveals no gallop and no friction rub.  No murmur heard. Pulmonary/Chest: Effort normal and breath sounds normal. No respiratory distress. He has no wheezes. He has no rhonchi. He has no rales. He exhibits no tenderness and no crepitus.  Abdominal: Soft. Normal appearance and bowel sounds are normal. He exhibits no distension. There is no tenderness. There is no rebound and no guarding.  Musculoskeletal: Normal range of motion. He exhibits no edema or tenderness.  Moves all extremities well.  Patient's thoracic and lumbar spine are nontender to palpation, he indicates his lower sacral area is where he  normally experiences pain.  Neurological: He is alert and oriented to person, place, and time. He has normal strength. No cranial nerve deficit.  Patient's grips are equal, he has some difficulty straight leg raising his legs however he and his wife states that is typical because he has weak quadriceps.  He has normal plantar flexion and strength.  He has no obvious asymmetrical motor weakness.  He is noted to have some very epidermal superficial abrasions of both knees with bruising of his proximal lower leg on the left that is anterior and lateral in position.  Skin: Skin is warm, dry and intact. No rash noted. No erythema. No pallor.  Psychiatric: He has a normal mood and affect. His speech is normal and behavior is normal. His mood appears not anxious.  Nursing note and vitals reviewed.      ED Treatments / Results  Labs (all labs ordered are listed, but only abnormal results are displayed) Results for orders placed or performed during the hospital encounter of 04/10/18  Culture, blood (routine x 2)  Result Value Ref Range   Specimen Description LEFT ANTECUBITAL    Special Requests      BOTTLES DRAWN AEROBIC AND ANAEROBIC Blood Culture adequate volume Performed at [facility], [address] [place], [place] [address]    Culture PENDING    Report Status PENDING   Culture, blood (routine x 2)  Result Value Ref Range   Specimen Description BLOOD RIGHT ARM    Special Requests      BOTTLES DRAWN AEROBIC AND ANAEROBIC Blood Culture results may not be optimal due to an inadequate volume of blood received in culture bottles Performed at [facility], [address] [place], [place] [address]    Culture PENDING    Report Status PENDING   Comprehensive metabolic panel  Result Value Ref Range   Sodium 134 (L) 135 -  145 mmol/L   Potassium 3.8 3.5 - 5.1 mmol/L   Chloride 103 98 - 111 mmol/L   CO2 19 (L) 22 - 32 mmol/L   Glucose, Bld 97 70 - 99 mg/dL   BUN 15 8 - 23 mg/dL    Creatinine, Ser 0.79 0.61 - 1.24 mg/dL   Calcium 9.3 8.9 - 10.3 mg/dL   Total Protein 7.0 6.5 - 8.1 g/dL   Albumin 4.2 3.5 - 5.0 g/dL   AST 21 15 - 41 U/L   ALT 20 0 - 44 U/L   Alkaline Phosphatase 39 38 - 126 U/L   Total Bilirubin 0.8 0.3 - 1.2 mg/dL   GFR calc non Af Amer >60 >60 mL/min   GFR calc Af Amer >60 >60 mL/min   Anion gap 12 5 - 15  CBC with Differential  Result Value Ref Range   WBC 6.3 4.0 - 10.5 K/uL   RBC 3.70 (L) 4.22 - 5.81 MIL/uL   Hemoglobin 13.1 13.0 - 17.0 g/dL   HCT 37.6 (L) 39.0 - 52.0 %   MCV 101.6 (H) 78.0 - 100.0 fL   MCH 35.4 (H) 26.0 - 34.0 pg   MCHC 34.8 30.0 - 36.0 g/dL   RDW 12.6 11.5 - 15.5 %   Platelets 217 150 - 400 K/uL   Neutrophils Relative % 58 %   Neutro Abs 3.6 1.7 - 7.7 K/uL   Lymphocytes Relative 30 %   Lymphs Abs 1.9 0.7 - 4.0 K/uL   Monocytes Relative 11 %   Monocytes Absolute 0.7 0.1 - 1.0 K/uL   Eosinophils Relative 1 %   Eosinophils Absolute 0.1 0.0 - 0.7 K/uL   Basophils Relative 0 %   Basophils Absolute 0.0 0.0 - 0.1 K/uL  Urinalysis, Routine w reflex microscopic  Result Value Ref Range   Color, Urine YELLOW YELLOW   APPearance HAZY (A) CLEAR   Specific Gravity, Urine 1.014 1.005 - 1.030   pH 5.0 5.0 - 8.0   Glucose, UA NEGATIVE NEGATIVE mg/dL   Hgb urine dipstick NEGATIVE NEGATIVE   Bilirubin Urine NEGATIVE NEGATIVE   Ketones, ur NEGATIVE NEGATIVE mg/dL   Protein, ur NEGATIVE NEGATIVE mg/dL   Nitrite NEGATIVE NEGATIVE   Leukocytes, UA NEGATIVE NEGATIVE  Lactic acid, plasma  Result Value Ref Range   Lactic Acid, Venous 2.9 (HH) 0.5 - 1.9 mmol/L   Laboratory interpretation all normal except elevated first lactic acid,    EKG None  Radiology Dg Tibia/fibula Left  Result Date: 04/11/2018 CLINICAL DATA:  [place] down steps while walking dog. EXAM: LEFT KNEE - COMPLETE 4+ VIEW; LEFT TIBIA AND FIBULA - 2 VIEW COMPARISON:  None. FINDINGS: LEFT knee: No acute fracture deformity or dislocation. No destructive bony  lesions. No advanced arthropathy. Small suprapatellar joint effusion. LEFT tibia and fibula: No acute fracture deformity or dislocation. No destructive bony lesions. Soft tissue planes are not suspicious. IMPRESSION: No acute fracture deformity or dislocation. Small suprapatellar joint effusion. Electronically Signed   By: Elon Alas M.D.   On: 04/11/2018 02:47   Ct Head Wo Contrast  Result Date: 04/11/2018 CLINICAL DATA:  [place] on steps while walking dog. EXAM: CT HEAD WITHOUT CONTRAST TECHNIQUE: Contiguous axial images were obtained from the base of the skull through the vertex without intravenous contrast. COMPARISON:  None. FINDINGS: BRAIN: No intraparenchymal hemorrhage, mass effect nor midline shift. Mild parenchymal brain volume loss. No acute large vascular territory infarcts. No abnormal extra-axial fluid collections. Basal cisterns are patent. VASCULAR: Mild calcific  atherosclerosis of the carotid siphons. SKULL: No skull fracture. Anterior and posterior arch of C1 are developmentally unfused. Osteopenia. No significant scalp soft tissue swelling. SINUSES/ORBITS: Trace paranasal sinus mucosal thickening. Mastoid air cells are well aerated.The included ocular globes and orbital contents are non-suspicious. OTHER: None. IMPRESSION: 1. No acute intracranial process. 2. Mild parenchymal brain volume loss. Electronically Signed   By: Elon Alas M.D.   On: 04/11/2018 02:49   Dg Knee Complete 4 Views Left  Result Date: 04/11/2018 CLINICAL DATA:  [place] down steps while walking dog. EXAM: LEFT KNEE - COMPLETE 4+ VIEW; LEFT TIBIA AND FIBULA - 2 VIEW COMPARISON:  None. FINDINGS: LEFT knee: No acute fracture deformity or dislocation. No destructive bony lesions. No advanced arthropathy. Small suprapatellar joint effusion. LEFT tibia and fibula: No acute fracture deformity or dislocation. No destructive bony lesions. Soft tissue planes are not suspicious. IMPRESSION: No acute fracture deformity or  dislocation. Small suprapatellar joint effusion. Electronically Signed   By: Elon Alas M.D.   On: 04/11/2018 02:47     MRI lumbar spine March 18, 2018 Mild to moderate degenerative changes of lumbar spine moderate right neuroforaminal stenosis at L4-L5.  No high-grade spinal canal stenosis.  1.3 cm lipoma on the filum terminale, no tethered cord. Procedures Procedures (including critical care time)  Medications Ordered in ED Medications  sodium chloride 0.9 % bolus 1,000 mL ( Intravenous Stopped 04/11/18 0111)  sodium chloride 0.9 % bolus 500 mL ( Intravenous Stopped 04/11/18 0110)  albuterol (PROVENTIL) (2.5 MG/3ML) 0.083% nebulizer solution 2.5 mg (2.5 mg Nebulization Given 04/11/18 0201)     Initial Impression / Assessment and Plan / ED Course  I have reviewed the triage vital signs and the nursing notes.  Pertinent labs & imaging results that were available during my care of the patient were reviewed by me and considered in my medical decision making (see chart for details).     Patient is noted to have a borderline elevation of his temperature, his blood pressure was in the 90s on arrival.  At the time of my exam it was 599 systolic.  His wife states that is much lower than his usual.  Patient again endorses that he felt fine all day until this happen.  I am suspicious that he may have an underlying infection such as a urinary tract infection that is making him weak.  He was given IV fluids, laboratory testing was started including a sepsis work-up.  Patient states his usual blood pressure is 130 over 70s.  Recheck at 2:25 AM patient has been ambulatory to the bathroom twice per nursing staff.  He states one time in the bathroom he had stepped backward away from the toilet and almost fell because he was off balance.  His wife who is a Marine scientist also notes that when he walks he is not walking like usual and it is more wide-based than usual.  He again denies any back pain.  He states  now his left knee and lower leg are starting to hurt.  X-rays were added of the left knee and left tib-fib, also head CT was done.  X-rays and CT do not show any obvious signs of injury other than a prepatellar effusion of the knee.  I did not start patient on antibiotics since the source of possible infection is not clear.  Patient probably needs MRI of his spine to look for the etiology of his lower leg weakness.  He recently had 1 of his lumbar  spine on August 23. Patient however continues to deny any back pain.   4:14 AM Dr. Manuella Ghazi, hospitalist will admit.  Final Clinical Impressions(s) / ED Diagnoses   Final diagnoses:  Weakness of both lower limbs  Low grade fever    Plan admission  Rolland Porter, MD, Barbette Or, MD 04/11/18 [phone]

## 2018-04-11 ENCOUNTER — Emergency Department (HOSPITAL_COMMUNITY): Payer: BLUE CROSS/BLUE SHIELD

## 2018-04-11 ENCOUNTER — Other Ambulatory Visit: Payer: Self-pay

## 2018-04-11 ENCOUNTER — Encounter: Payer: BLUE CROSS/BLUE SHIELD | Admitting: Physical Therapy

## 2018-04-11 ENCOUNTER — Encounter (HOSPITAL_COMMUNITY): Payer: Self-pay | Admitting: Family Medicine

## 2018-04-11 ENCOUNTER — Inpatient Hospital Stay (HOSPITAL_COMMUNITY): Payer: BLUE CROSS/BLUE SHIELD

## 2018-04-11 DIAGNOSIS — R29898 Other symptoms and signs involving the musculoskeletal system: Secondary | ICD-10-CM

## 2018-04-11 DIAGNOSIS — E872 Acidosis, unspecified: Secondary | ICD-10-CM | POA: Diagnosis present

## 2018-04-11 DIAGNOSIS — R531 Weakness: Secondary | ICD-10-CM

## 2018-04-11 LAB — CBC
HCT: 32.2 % — ABNORMAL LOW (ref 39.0–52.0)
Hemoglobin: 11 g/dL — ABNORMAL LOW (ref 13.0–17.0)
MCH: 34.9 pg — ABNORMAL HIGH (ref 26.0–34.0)
MCHC: 34.2 g/dL (ref 30.0–36.0)
MCV: 102.2 fL — ABNORMAL HIGH (ref 78.0–100.0)
Platelets: 158 10*3/uL (ref 150–400)
RBC: 3.15 MIL/uL — ABNORMAL LOW (ref 4.22–5.81)
RDW: 12 % (ref 11.5–15.5)
WBC: 5.4 10*3/uL (ref 4.0–10.5)

## 2018-04-11 LAB — CBC WITH DIFFERENTIAL/PLATELET
Basophils Absolute: 0 10*3/uL (ref 0.0–0.1)
Basophils Relative: 0 %
Eosinophils Absolute: 0.1 10*3/uL (ref 0.0–0.7)
Eosinophils Relative: 1 %
HCT: 37.6 % — ABNORMAL LOW (ref 39.0–52.0)
Hemoglobin: 13.1 g/dL (ref 13.0–17.0)
Lymphocytes Relative: 30 %
Lymphs Abs: 1.9 10*3/uL (ref 0.7–4.0)
MCH: 35.4 pg — ABNORMAL HIGH (ref 26.0–34.0)
MCHC: 34.8 g/dL (ref 30.0–36.0)
MCV: 101.6 fL — ABNORMAL HIGH (ref 78.0–100.0)
Monocytes Absolute: 0.7 10*3/uL (ref 0.1–1.0)
Monocytes Relative: 11 %
Neutro Abs: 3.6 10*3/uL (ref 1.7–7.7)
Neutrophils Relative %: 58 %
Platelets: 217 10*3/uL (ref 150–400)
RBC: 3.7 MIL/uL — ABNORMAL LOW (ref 4.22–5.81)
RDW: 12.6 % (ref 11.5–15.5)
WBC: 6.3 10*3/uL (ref 4.0–10.5)

## 2018-04-11 LAB — COMPREHENSIVE METABOLIC PANEL
ALT: 20 U/L (ref 0–44)
AST: 21 U/L (ref 15–41)
Albumin: 4.2 g/dL (ref 3.5–5.0)
Alkaline Phosphatase: 39 U/L (ref 38–126)
Anion gap: 12 (ref 5–15)
BUN: 15 mg/dL (ref 8–23)
CO2: 19 mmol/L — ABNORMAL LOW (ref 22–32)
Calcium: 9.3 mg/dL (ref 8.9–10.3)
Chloride: 103 mmol/L (ref 98–111)
Creatinine, Ser: 0.79 mg/dL (ref 0.61–1.24)
GFR calc Af Amer: >60 mL/min (ref >60)
GFR calc non Af Amer: >60 mL/min (ref >60)
Glucose, Bld: 97 mg/dL (ref 70–99)
Potassium: 3.8 mmol/L (ref 3.5–5.1)
Sodium: 134 mmol/L — ABNORMAL LOW (ref 135–145)
Total Bilirubin: 0.8 mg/dL (ref 0.3–1.2)
Total Protein: 7 g/dL (ref 6.5–8.1)

## 2018-04-11 LAB — URINALYSIS, ROUTINE W REFLEX MICROSCOPIC
Bilirubin Urine: NEGATIVE
Glucose, UA: NEGATIVE mg/dL
Hgb urine dipstick: NEGATIVE
Ketones, ur: NEGATIVE mg/dL
Leukocytes, UA: NEGATIVE
Nitrite: NEGATIVE
Protein, ur: NEGATIVE mg/dL
Specific Gravity, Urine: 1.014 (ref 1.005–1.030)
pH: 5 (ref 5.0–8.0)

## 2018-04-11 LAB — CREATININE, SERUM
Creatinine, Ser: 0.74 mg/dL (ref 0.61–1.24)
GFR calc Af Amer: >60 mL/min (ref >60)
GFR calc non Af Amer: >60 mL/min (ref >60)

## 2018-04-11 LAB — TSH: TSH: 0.931 u[IU]/mL (ref 0.350–4.500)

## 2018-04-11 LAB — LACTIC ACID, PLASMA
Lactic Acid, Venous: 2.9 mmol/L (ref 0.5–1.9)
Lactic Acid, Venous: 1.9 mmol/L (ref 0.5–1.9)

## 2018-04-11 LAB — VITAMIN B12: Vitamin B-12: 178 pg/mL — ABNORMAL LOW (ref 180–914)

## 2018-04-11 MED ORDER — CALCIUM-VITAMIN D 500-200 MG-UNIT PO TABS
1.0000 | ORAL_TABLET | Freq: Two times a day (BID) | ORAL | Status: DC
  Administered 2018-04-11 – 2018-04-12 (×3): 1 via ORAL
  Filled 2018-04-11 (×5): qty 1

## 2018-04-11 MED ORDER — GADOBENATE DIMEGLUMINE 529 MG/ML IV SOLN
20.0000 mL | Freq: Once | INTRAVENOUS | Status: AC | PRN
  Administered 2018-04-11: 20 mL via INTRAVENOUS

## 2018-04-11 MED ORDER — ONDANSETRON HCL 4 MG/2ML IJ SOLN: 4.0000 mg | Freq: Four times a day (QID) | INTRAMUSCULAR | Status: DC | PRN

## 2018-04-11 MED ORDER — SODIUM CHLORIDE 0.9 % IV SOLN: 250.0000 mL | INTRAVENOUS | Status: DC | PRN

## 2018-04-11 MED ORDER — SODIUM CHLORIDE 0.9% FLUSH: 3.0000 mL | INTRAVENOUS | Status: DC | PRN

## 2018-04-11 MED ORDER — ENOXAPARIN SODIUM 40 MG/0.4ML ~~LOC~~ SOLN
40.0000 mg | SUBCUTANEOUS | Status: DC
  Administered 2018-04-11 – 2018-04-12 (×2): 40 mg via SUBCUTANEOUS
  Filled 2018-04-11 (×2): qty 0.4

## 2018-04-11 MED ORDER — OXYCODONE HCL 5 MG PO TABS: 5.0000 mg | ORAL_TABLET | ORAL | Status: DC | PRN

## 2018-04-11 MED ORDER — ALBUTEROL SULFATE (2.5 MG/3ML) 0.083% IN NEBU
2.5000 mg | INHALATION_SOLUTION | Freq: Once | RESPIRATORY_TRACT | Status: AC
  Administered 2018-04-11: 2.5 mg via RESPIRATORY_TRACT
  Filled 2018-04-11: qty 3

## 2018-04-11 MED ORDER — ABIRATERONE ACETATE 250 MG PO TABS
1000.0000 mg | ORAL_TABLET | Freq: Every day | ORAL | Status: DC
  Administered 2018-04-12: 1000 mg via ORAL

## 2018-04-11 MED ORDER — ALBUTEROL SULFATE (2.5 MG/3ML) 0.083% IN NEBU: 3.0000 mL | INHALATION_SOLUTION | Freq: Four times a day (QID) | RESPIRATORY_TRACT | Status: DC | PRN

## 2018-04-11 MED ORDER — MOMETASONE FURO-FORMOTEROL FUM 200-5 MCG/ACT IN AERO
2.0000 | INHALATION_SPRAY | Freq: Two times a day (BID) | RESPIRATORY_TRACT | Status: DC
  Administered 2018-04-11 – 2018-04-12 (×2): 2 via RESPIRATORY_TRACT
  Filled 2018-04-11 (×2): qty 8.8

## 2018-04-11 MED ORDER — ACETAMINOPHEN 325 MG PO TABS: 650.0000 mg | ORAL_TABLET | Freq: Four times a day (QID) | ORAL | Status: DC | PRN

## 2018-04-11 MED ORDER — FLUTICASONE PROPIONATE 50 MCG/ACT NA SUSP
1.0000 | Freq: Every day | NASAL | Status: DC
  Filled 2018-04-11: qty 16

## 2018-04-11 MED ORDER — ONDANSETRON HCL 4 MG PO TABS: 4.0000 mg | ORAL_TABLET | Freq: Four times a day (QID) | ORAL | Status: DC | PRN

## 2018-04-11 MED ORDER — ACETAMINOPHEN 650 MG RE SUPP: 650.0000 mg | Freq: Four times a day (QID) | RECTAL | Status: DC | PRN

## 2018-04-11 MED ORDER — PREDNISONE 10 MG PO TABS
5.0000 mg | ORAL_TABLET | Freq: Two times a day (BID) | ORAL | Status: DC
  Administered 2018-04-11 – 2018-04-12 (×3): 5 mg via ORAL
  Filled 2018-04-11 (×3): qty 1

## 2018-04-11 MED ORDER — SODIUM CHLORIDE 0.9% FLUSH
3.0000 mL | Freq: Two times a day (BID) | INTRAVENOUS | Status: DC
  Administered 2018-04-11 – 2018-04-12 (×3): 3 mL via INTRAVENOUS

## 2018-04-11 MED ORDER — LORAZEPAM 2 MG/ML IJ SOLN
1.0000 mg | Freq: Once | INTRAMUSCULAR | Status: AC
  Administered 2018-04-11: 1 mg via INTRAVENOUS
  Filled 2018-04-11: qty 1

## 2018-04-11 MED ORDER — AZELASTINE HCL 0.1 % NA SOLN
2.0000 | Freq: Every morning | NASAL | Status: DC
  Filled 2018-04-11: qty 30

## 2018-04-11 NOTE — H&P (Signed)
History and Physical    ZURICH CARRENO GYJ:[id number] DOB: Dec 22, 1955 DOA: 04/10/2018  PCP: [name], MD   Patient coming from: Home  Chief Complaint: Sudden onset bilateral LE weakness  HPI: [name] Holder is a 62 y.o. male with medical history significant for metastatic prostate cancer-currently on Lupron, asthma, and hypertension who was feeling fine earlier today until he came back home from walking his dogs after dinner.  He tried going up the steps to his house and he suddenly became weak in both lower extremities causing him to fall onto the steps of his home.  His legs were noted to be extremely weak and other than this, he did not have any other symptoms of lightheadedness, dizziness, headache, visual change, dysarthria, numbness, or tingling.  His wife called 55 and EMS was able to help the patient up to a standing position and he could stand, but had great difficulty ambulating.  He had to be transported on the stretcher to the ED.  Patient does not have any changes to his urination or bowel movements from his usual baseline.  Of note, he has had a recent lumbar MRI ordered by Dr. Lynann Bologna of orthopedics on account of persistent low back pain in the setting of prostate cancer.  This was done on 8/23 with noted L4/5 spondylolisthesis along with right L4/5 foraminal stenosis.  He is actually doing some physical therapy for his back at this time.  He denies any current back pain.   ED Course: Vital signs are stable with some soft blood pressure readings noted.  Lactic acid level is 2.9 and patient is noted to have some low-grade temperature of 99 Fahrenheit.  He has been given 1.5 L fluid bolus as well as some nebulizer treatment.  He has had x-rays of his knees which have some minor abrasions and these are negative for any acute abnormalities.  CT of the head with no acute findings.  Patient was able to ambulate to the bathroom in the ED, but had a very broad-based gait.  Review  of Systems: All others reviewed and otherwise negative.  Past Medical History:  Diagnosis Date  . Allergy   . Asthma   . Elevated PSA 10/03/2012   23.37  . History of kidney stones   . History of kidney stones   . Hx of colonic polyps   . Hypertension   . Osteopenia   . Prostate cancer [facility])     Past Surgical History:  Procedure Laterality Date  . COLONOSCOPY    . HERNIA REPAIR  ?2008  . LYMPHADENECTOMY Left 01/05/2013   Procedure: ROBOTIC LYMPHADENECTOMY;  Surgeon: Dutch Gray, MD;  Location: [facility];  Service: Urology;  Laterality: Left;  . PROSTATE BIOPSY     11/12 positive biopsies  . ROTATOR CUFF REPAIR  2012     reports that he has quit smoking. His smoking use included cigarettes. He has a 7.50 pack-year smoking history. He has never used smokeless tobacco. He reports that he drinks about 2.0 standard drinks of alcohol per week. He reports that he does not use drugs.  Allergies  Allergen Reactions  . Amlodipine Swelling    edema  . Tetracycline Rash    Family History  Problem Relation Age of Onset  . Arthritis Mother   . Diabetes Mother   . Hypertension Mother   . Hyperlipidemia Mother   . Hypertension Father   . Cancer Father        Prostate Cancer/radiation mid  35's  . Cancer Sister        precancerous colon polyps, 6 in colon removed  . Stroke Maternal Grandfather     Prior to Admission medications   Medication Sig Start Date End Date Taking? Authorizing Provider  abiraterone acetate (ZYTIGA) 250 MG tablet Take 1,000 mg by mouth daily.  08/17/17  Yes [provider]  albuterol (PROAIR HFA) 108 (90 Base) MCG/ACT inhaler 2 PUFFS EVERY 6 HOURS AS NEEDED FOR WHEEZING Patient taking differently: Inhale 2 puffs into the lungs every 6 (six) hours as needed for wheezing or shortness of breath.  03/09/18  Yes [name], MD  azelastine (ASTELIN) 0.1 % nasal spray USE 1 TO 2 SPRAYS IN EACH NOSTRIL EVERY 12 HOURS Patient taking differently: Place 2  sprays into both nostrils every morning.  09/16/17  Yes [name], MD  CALCIUM-VITAMIN D PO Take 1 tablet by mouth 2 (two) times daily.   Yes [provider]  carvedilol (COREG) 3.125 MG tablet Take 1 tablet (3.125 mg total) by mouth 2 (two) times daily with a meal. 12/17/17  Yes [name], MD  diclofenac sodium (VOLTAREN) 1 % GEL Apply 2 g topically daily as needed (for pain).  06/10/16  Yes [provider]  Leuprolide Acetate (LUPRON IJ) Inject 1 Dose as directed every 4 (four) months. This is administered by [facility]   Yes [provider]  mometasone (NASONEX) 50 MCG/ACT nasal spray USE 1 TO 2 SPRAYS IN EACH NOSTRIL ONCE DAILY. Patient taking differently: Place 2 sprays into the nose daily.  09/16/17  Yes [name], MD  olmesartan (BENICAR) 40 MG tablet TAKE 1 TABLET DAILY Patient taking differently: Take 40 mg by mouth daily.  02/21/18  Yes [name], MD  predniSONE (DELTASONE) 5 MG tablet Take 5 mg by mouth 2 (two) times daily.  08/28/17  Yes [provider]  SYMBICORT 160-4.5 MCG/ACT inhaler Inhale 2 puffs into the lungs 2 (two) times daily. Patient taking differently: Inhale 2 puffs into the lungs 2 (two) times daily.  03/12/18  Yes [name], MD    Physical Exam: Vitals:   04/10/18 2252 04/11/18 0002 04/11/18 0202 04/11/18 0334  BP: (!) 99/57     Pulse: 86     Resp: 16     Temp: 99 F (37.2 C) 99.2 F (37.3 C)  99.6 F (37.6 C)  TempSrc: Oral Oral  Oral  SpO2: 97%  96%   Weight:      Height:        Constitutional: NAD, calm, comfortable Vitals:   04/10/18 2252 04/11/18 0002 04/11/18 0202 04/11/18 0334  BP: (!) 99/57     Pulse: 86     Resp: 16     Temp: 99 F (37.2 C) 99.2 F (37.3 C)  99.6 F (37.6 C)  TempSrc: Oral Oral  Oral  SpO2: 97%  96%   Weight:      Height:       Eyes: lids and conjunctivae normal ENMT: Mucous membranes are moist.  Neck: normal, supple Respiratory: clear to auscultation  bilaterally. Normal respiratory effort. No accessory muscle use.  Cardiovascular: Regular rate and rhythm, no murmurs. No extremity edema. Abdomen: no tenderness, no distention. Bowel sounds positive.  Musculoskeletal:  No joint deformity upper and lower extremities.   Skin: no rashes, lesions, ulcers.  Some abrasions and ecchymosis to bilateral lower extremities. Psychiatric: Normal judgment and insight. Alert and oriented x 3. Normal mood.  Neurological: Cranial nerves II through XII grossly intact.  Sensation to light touch in all dermatomal regions of lower extremities are intact.  Patellar reflexes are hyperreflexic and Achilles reflexes are diminished.  Motor testing is symmetric and 5/5 in all extremities.  Wife states that patient has a wide-based gait with ambulation.  Labs on Admission: I have personally reviewed following labs and imaging studies  CBC: Recent Labs  Lab 04/10/18 2343  WBC 6.3  NEUTROABS 3.6  HGB 13.1  HCT 37.6*  MCV 101.6*  PLT 518   Basic Metabolic Panel: Recent Labs  Lab 04/10/18 2343  NA 134*  K 3.8  CL 103  CO2 19*  GLUCOSE 97  BUN 15  CREATININE 0.79  CALCIUM 9.3   GFR: Estimated Creatinine Clearance: 107.4 mL/min (by C-G formula based on SCr of 0.79 mg/dL). Liver Function Tests: Recent Labs  Lab 04/10/18 2343  AST 21  ALT 20  ALKPHOS 39  BILITOT 0.8  PROT 7.0  ALBUMIN 4.2   No results for input(s): LIPASE, AMYLASE in the last 168 hours. No results for input(s): AMMONIA in the last 168 hours. Coagulation Profile: No results for input(s): INR, PROTIME in the last 168 hours. Cardiac Enzymes: No results for input(s): CKTOTAL, CKMB, CKMBINDEX, TROPONINI in the last 168 hours. BNP (last 3 results) No results for input(s): PROBNP in the last 8760 hours. HbA1C: No results for input(s): HGBA1C in the last 72 hours. CBG: No results for input(s): GLUCAP in the last 168 hours. Lipid Profile: No results for input(s): CHOL, HDL, LDLCALC,  TRIG, CHOLHDL, LDLDIRECT in the last 72 hours. Thyroid Function Tests: No results for input(s): TSH, T4TOTAL, FREET4, T3FREE, THYROIDAB in the last 72 hours. Anemia Panel: No results for input(s): VITAMINB12, FOLATE, FERRITIN, TIBC, IRON, RETICCTPCT in the last 72 hours. Urine analysis:    Component Value Date/Time   COLORURINE YELLOW 04/11/2018 0040   APPEARANCEUR HAZY (A) 04/11/2018 0040   LABSPEC 1.014 04/11/2018 0040   PHURINE 5.0 04/11/2018 0040   GLUCOSEU NEGATIVE 04/11/2018 0040   GLUCOSEU NEGATIVE 08/31/2016 1030   HGBUR NEGATIVE 04/11/2018 0040   BILIRUBINUR NEGATIVE 04/11/2018 0040   KETONESUR NEGATIVE 04/11/2018 0040   PROTEINUR NEGATIVE 04/11/2018 0040   UROBILINOGEN 0.2 08/31/2016 1030   NITRITE NEGATIVE 04/11/2018 0040   LEUKOCYTESUR NEGATIVE 04/11/2018 0040    Radiological Exams on Admission: Dg Tibia/fibula Left  Result Date: 04/11/2018 CLINICAL DATA:  [place] down steps while walking dog. EXAM: LEFT KNEE - COMPLETE 4+ VIEW; LEFT TIBIA AND FIBULA - 2 VIEW COMPARISON:  None. FINDINGS: LEFT knee: No acute fracture deformity or dislocation. No destructive bony lesions. No advanced arthropathy. Small suprapatellar joint effusion. LEFT tibia and fibula: No acute fracture deformity or dislocation. No destructive bony lesions. Soft tissue planes are not suspicious. IMPRESSION: No acute fracture deformity or dislocation. Small suprapatellar joint effusion. Electronically Signed   By: Elon Alas M.D.   On: 04/11/2018 02:47   Ct Head Wo Contrast  Result Date: 04/11/2018 CLINICAL DATA:  [place] on steps while walking dog. EXAM: CT HEAD WITHOUT CONTRAST TECHNIQUE: Contiguous axial images were obtained from the base of the skull through the vertex without intravenous contrast. COMPARISON:  None. FINDINGS: BRAIN: No intraparenchymal hemorrhage, mass effect nor midline shift. Mild parenchymal brain volume loss. No acute large vascular territory infarcts. No abnormal extra-axial fluid  collections. Basal cisterns are patent. VASCULAR: Mild calcific atherosclerosis of the carotid siphons. SKULL: No skull fracture. Anterior and posterior arch of C1 are developmentally unfused. Osteopenia.  No significant scalp soft tissue swelling. SINUSES/ORBITS: Trace paranasal sinus mucosal thickening. Mastoid air cells are well aerated.The included ocular globes and orbital contents are non-suspicious. OTHER: None. IMPRESSION: 1. No acute intracranial process. 2. Mild parenchymal brain volume loss. Electronically Signed   By: Elon Alas M.D.   On: 04/11/2018 02:49   Dg Knee Complete 4 Views Left  Result Date: 04/11/2018 CLINICAL DATA:  [place] down steps while walking dog. EXAM: LEFT KNEE - COMPLETE 4+ VIEW; LEFT TIBIA AND FIBULA - 2 VIEW COMPARISON:  None. FINDINGS: LEFT knee: No acute fracture deformity or dislocation. No destructive bony lesions. No advanced arthropathy. Small suprapatellar joint effusion. LEFT tibia and fibula: No acute fracture deformity or dislocation. No destructive bony lesions. Soft tissue planes are not suspicious. IMPRESSION: No acute fracture deformity or dislocation. Small suprapatellar joint effusion. Electronically Signed   By: Elon Alas M.D.   On: 04/11/2018 02:47    Assessment/Plan Principal Problem:   Weakness of both lower extremities Active Problems:   Essential hypertension   Asthma, mild intermittent   History of prostate cancer   Lactic acidosis    1. Sudden onset weakness of bilateral lower extremities.  This is in the setting of metastatic prostate cancer and there is certainly concern of metastasis to the spinal cord.  I do not suspect CVA.  Patient will require significant further imaging studies likely with MRI of the entire spine and possibly even brain MRI.  Will consult neurology for further evaluation to help guide imaging and treatment.  PT evaluation and fall precautions. 2. Lactic acidosis.  I am uncertain as to why patient has an  elevated lactic acid level, but will follow up this a.m.  Patient has already received a 1.5 L fluid bolus. 3. Essential hypertension.  Currently with soft blood pressure readings.  Will hold carvedilol and olmesartan. 4. Asthma.  Continue rescue inhaler as needed as well as Dulera daily. 5. Metastatic prostate cancer.  Patient is on Lupron injections and is followed by oncology in [place].   DVT prophylaxis: Lovenox Code Status: Full  Family Communication: Wife at bedside Disposition Plan:PT/Evaluation of LE weakness Consults called:Neurology Admission status: Inpatient, MedSurg   Yoel Kaufhold Darleen Crocker DO [facility] Pager [phone]  If 7PM-7AM, please contact night-coverage [email or web address] Password [facility]  04/11/2018, 4:44 AM

## 2018-04-11 NOTE — Progress Notes (Signed)
04/11/2018 8:13 AM  04/10/2018 10:47 PM  04/11/2018 8:13 AM  Glendon Axe was seen and examined.  The H&P by the admitting provider, orders, imaging was reviewed.  Please see new orders.  MRI testing ordered.  Will continue to follow.  Await for neurology consultation.   Murvin Natal, MD [facility]

## 2018-04-11 NOTE — Consult Note (Signed)
[name] Dorado A. Merlene Laughter, MD     [email or web address]          [name] Holder is an 62 y.o. male.   ASSESSMENT/PLAN: 1.  Acute leg weakness with etiology unclear: The patient does have brisk reflexes however suggestive of myelopathy.  Cervical spine MRI will be obtained.  I will start with a plain MRI without contrast.  Additional labs will be obtained.  Orthostatics will also be obtained.  Agree with physical therapy.  2.  Low back pain with evidence of multilevel disc disease and spondylosis: This is unlikely to explain the patient's weakness however.     The patient is a 62 year old white male who was walking around on yesterday when he developed acute leg weakness.  He does not report numbness, symptoms involve the upper extremities, dizziness, loss of consciousness, chest pain or shortness of breath.  He reports that the weakness lasted for about 35 to 45 minutes.  The patient did fall sustaining injuries to the left lower extremity.  He reports that the right leg is completely normal but he still has some weakness of the left lower extremity although he thinks is due to bruising and pain related to the fall.  He has a lot of left hip pain and left leg pain after the fall.  There is a history of chronic low back pain for which he has been receiving physical therapy.  Patient has been up ambulating today with the nurses.  He does report some wobbliness of his legs but thinks is due to the left lower extremity pain.  Patient reports that he has been checked routinely for metastatic prostate cancer.  Tells me this has been fine.  There has not been any evidence of metastasis.  He denies neck pain.  The review of systems otherwise negative.    GENERAL: He is doing well currently and in no acute distress.  HEENT: He has full range of motion of the neck.  No trauma appreciated.  ABDOMEN: soft  EXTREMITIES: No edema; there is extensive bruising of the left leg.  There is  minimal pain with range of motion of the joints of the lower extremities on the left side.  BACK: Normal alignment   SKIN: Normal by inspection.    MENTAL STATUS: Alert and oriented. Speech, language and cognition are generally intact. Judgment and insight normal.   CRANIAL NERVES: Pupils are equal, round and reactive to light and accomodation; extra ocular movements are full, there is no significant nystagmus; visual fields are full; upper and lower facial muscles are normal in strength and symmetric, there is no flattening of the nasolabial folds; tongue is midline; uvula is midline; shoulder elevation is normal.  MOTOR: Left hip flexion and dorsiflexion are 4+/5.  The other muscle groups examined show normal tone, bulk and strength; no pronator drift.  COORDINATION: Left finger to nose is normal, right finger to nose is normal, No rest tremor; no intention tremor; no postural tremor; no bradykinesia.  REFLEXES: Deep tendon reflexes are symmetrical but brisk. Plantar reflexes are flexor bilaterally.   SENSATION: Normal to light touch, temperature, and pain.         Blood pressure (!) 142/87, pulse 85, temperature 98.7 F (37.1 C), temperature source Oral, resp. rate 16, height '5\' 9"'$  (1.753 m), weight 97.5 kg, SpO2 100 %.  Past Medical History:  Diagnosis Date  . Allergy   . Asthma   . Elevated PSA 10/03/2012   23.37  . History of  kidney stones   . History of kidney stones   . Hx of colonic polyps   . Hypertension   . Osteopenia   . Prostate cancer [facility])     Past Surgical History:  Procedure Laterality Date  . COLONOSCOPY    . HERNIA REPAIR  ?2008  . LYMPHADENECTOMY Left 01/05/2013   Procedure: ROBOTIC LYMPHADENECTOMY;  Surgeon: [name] Gray, MD;  Location: [facility];  Service: Urology;  Laterality: Left;  . PROSTATE BIOPSY     11/12 positive biopsies  . ROTATOR CUFF REPAIR  2012    Family History  Problem Relation Age of Onset  . Arthritis Mother   . Diabetes  Mother   . Hypertension Mother   . Hyperlipidemia Mother   . Hypertension Father   . Cancer Father        Prostate Cancer/radiation mid 33's  . Cancer Sister        precancerous colon polyps, 6 in colon removed  . Stroke Maternal Grandfather     Social History:  reports that he has quit smoking. His smoking use included cigarettes. He has a 7.50 pack-year smoking history. He has never used smokeless tobacco. He reports that he drinks about 2.0 standard drinks of alcohol per week. He reports that he does not use drugs.  Allergies:  Allergies  Allergen Reactions  . Amlodipine Swelling    edema  . Tetracycline Rash    Medications: Prior to Admission medications   Medication Sig Start Date End Date Taking? Authorizing Provider  abiraterone acetate (ZYTIGA) 250 MG tablet Take 1,000 mg by mouth daily.  08/17/17  Yes [provider]  albuterol (PROAIR HFA) 108 (90 Base) MCG/ACT inhaler 2 PUFFS EVERY 6 HOURS AS NEEDED FOR WHEEZING Patient taking differently: Inhale 2 puffs into the lungs every 6 (six) hours as needed for wheezing or shortness of breath.  03/09/18  Yes [name] Lima, MD  azelastine (ASTELIN) 0.1 % nasal spray USE 1 TO 2 SPRAYS IN EACH NOSTRIL EVERY 12 HOURS Patient taking differently: Place 2 sprays into both nostrils every morning.  09/16/17  Yes [name] Lima, MD  CALCIUM-VITAMIN D PO Take 1 tablet by mouth 2 (two) times daily.   Yes [provider]  carvedilol (COREG) 3.125 MG tablet Take 1 tablet (3.125 mg total) by mouth 2 (two) times daily with a meal. 12/17/17  Yes [name] Lima, MD  diclofenac sodium (VOLTAREN) 1 % GEL Apply 2 g topically daily as needed (for pain).  06/10/16  Yes [provider]  Leuprolide Acetate (LUPRON IJ) Inject 1 Dose as directed every 4 (four) months. This is administered by [facility]   Yes [provider]  mometasone (NASONEX) 50 MCG/ACT nasal spray USE 1 TO 2 SPRAYS IN EACH NOSTRIL ONCE  DAILY. Patient taking differently: Place 2 sprays into the nose daily.  09/16/17  Yes [name] Lima, MD  olmesartan (BENICAR) 40 MG tablet TAKE 1 TABLET DAILY Patient taking differently: Take 40 mg by mouth daily.  02/21/18  Yes [name] Lima, MD  predniSONE (DELTASONE) 5 MG tablet Take 5 mg by mouth 2 (two) times daily.  08/28/17  Yes [provider]  SYMBICORT 160-4.5 MCG/ACT inhaler Inhale 2 puffs into the lungs 2 (two) times daily. Patient taking differently: Inhale 2 puffs into the lungs 2 (two) times daily.  03/12/18  Yes [name] Lima, MD    Scheduled Meds: . abiraterone acetate  1,000 mg Oral Daily  . azelastine  2 spray  Each Nare q morning - 10a  . calcium-vitamin D  1 tablet Oral BID  . enoxaparin (LOVENOX) injection  40 mg Subcutaneous Q24H  . fluticasone  1 spray Each Nare Daily  . mometasone-formoterol  2 puff Inhalation BID  . predniSONE  5 mg Oral BID  . sodium chloride flush  3 mL Intravenous Q12H   Continuous Infusions: . sodium chloride     PRN Meds:.sodium chloride, acetaminophen **OR** acetaminophen, albuterol, ondansetron **OR** ondansetron (ZOFRAN) IV, oxyCODONE, sodium chloride flush     Results for orders placed or performed during the hospital encounter of 04/10/18 (from the past 48 hour(s))  Comprehensive metabolic panel     Status: Abnormal   Collection Time: 04/10/18 11:43 PM  Result Value Ref Range   Sodium 134 (L) 135 - 145 mmol/L   Potassium 3.8 3.5 - 5.1 mmol/L   Chloride 103 98 - 111 mmol/L   CO2 19 (L) 22 - 32 mmol/L   Glucose, Bld 97 70 - 99 mg/dL   BUN 15 8 - 23 mg/dL   Creatinine, Ser 0.79 0.61 - 1.24 mg/dL   Calcium 9.3 8.9 - 10.3 mg/dL   Total Protein 7.0 6.5 - 8.1 g/dL   Albumin 4.2 3.5 - 5.0 g/dL   AST 21 15 - 41 U/L   ALT 20 0 - 44 U/L   Alkaline Phosphatase 39 38 - 126 U/L   Total Bilirubin 0.8 0.3 - 1.2 mg/dL   GFR calc non Af Amer >60 >60 mL/min   GFR calc Af Amer >60 >60 mL/min    Comment: (NOTE) The eGFR has been  calculated using the CKD EPI equation. This calculation has not been validated in all clinical situations. eGFR's persistently <60 mL/min signify possible Chronic Kidney Disease.    Anion gap 12 5 - 15    Comment: Performed at [facility], [address] [place], [place] [address]  CBC with Differential     Status: Abnormal   Collection Time: 04/10/18 11:43 PM  Result Value Ref Range   WBC 6.3 4.0 - 10.5 K/uL   RBC 3.70 (L) 4.22 - 5.81 MIL/uL   Hemoglobin 13.1 13.0 - 17.0 g/dL   HCT 37.6 (L) 39.0 - 52.0 %   MCV 101.6 (H) 78.0 - 100.0 fL   MCH 35.4 (H) 26.0 - 34.0 pg   MCHC 34.8 30.0 - 36.0 g/dL   RDW 12.6 11.5 - 15.5 %   Platelets 217 150 - 400 K/uL   Neutrophils Relative % 58 %   Neutro Abs 3.6 1.7 - 7.7 K/uL   Lymphocytes Relative 30 %   Lymphs Abs 1.9 0.7 - 4.0 K/uL   Monocytes Relative 11 %   Monocytes Absolute 0.7 0.1 - 1.0 K/uL   Eosinophils Relative 1 %   Eosinophils Absolute 0.1 0.0 - 0.7 K/uL   Basophils Relative 0 %   Basophils Absolute 0.0 0.0 - 0.1 K/uL    Comment: Performed at [facility], [address], [place], [place] [address]  Culture, blood (routine x 2)     Status: None (Preliminary result)   Collection Time: 04/10/18 11:44 PM  Result Value Ref Range   Specimen Description LEFT ANTECUBITAL    Special Requests      BOTTLES DRAWN AEROBIC AND ANAEROBIC Blood Culture adequate volume   Culture      NO GROWTH < 12 HOURS Performed at [facility], [address], [place], [place] [address]    Report Status PENDING   Lactic  acid, plasma     Status: Abnormal   Collection Time: 04/10/18 11:45 PM  Result Value Ref Range   Lactic Acid, Venous 2.9 (HH) 0.5 - 1.9 mmol/L    Comment: CRITICAL RESULT CALLED TO, READ BACK BY AND VERIFIED WITH: MYRICK,B @ 0122 ON 04/11/18 BY JUW Performed at [facility], [address] [place], [place] [address]   Culture, blood (routine x 2)     Status: None (Preliminary result)   Collection Time: 04/10/18 11:49 PM  Result Value  Ref Range   Specimen Description BLOOD RIGHT ARM    Special Requests      BOTTLES DRAWN AEROBIC AND ANAEROBIC Blood Culture results may not be optimal due to an inadequate volume of blood received in culture bottles   Culture      NO GROWTH < 12 HOURS Performed at [facility], [address], [place], [place] [address]    Report Status PENDING   Urinalysis, Routine w reflex microscopic     Status: Abnormal   Collection Time: 04/11/18 12:40 AM  Result Value Ref Range   Color, Urine YELLOW YELLOW   APPearance HAZY (A) CLEAR   Specific Gravity, Urine 1.014 1.005 - 1.030   pH 5.0 5.0 - 8.0   Glucose, UA NEGATIVE NEGATIVE mg/dL   Hgb urine dipstick NEGATIVE NEGATIVE   Bilirubin Urine NEGATIVE NEGATIVE   Ketones, ur NEGATIVE NEGATIVE mg/dL   Protein, ur NEGATIVE NEGATIVE mg/dL   Nitrite NEGATIVE NEGATIVE   Leukocytes, UA NEGATIVE NEGATIVE    Comment: Performed at [facility], [address] [place], [place] [address]  CBC     Status: Abnormal   Collection Time: 04/11/18  5:43 AM  Result Value Ref Range   WBC 5.4 4.0 - 10.5 K/uL   RBC 3.15 (L) 4.22 - 5.81 MIL/uL   Hemoglobin 11.0 (L) 13.0 - 17.0 g/dL   HCT 32.2 (L) 39.0 - 52.0 %   MCV 102.2 (H) 78.0 - 100.0 fL   MCH 34.9 (H) 26.0 - 34.0 pg   MCHC 34.2 30.0 - 36.0 g/dL   RDW 12.0 11.5 - 15.5 %   Platelets 158 150 - 400 K/uL    Comment: Performed at [facility], [address], [place], [place] [address]  Creatinine, serum     Status: None   Collection Time: 04/11/18  5:43 AM  Result Value Ref Range   Creatinine, Ser 0.74 0.61 - 1.24 mg/dL   GFR calc non Af Amer >60 >60 mL/min   GFR calc Af Amer >60 >60 mL/min    Comment: (NOTE) The eGFR has been calculated using the CKD EPI equation. This calculation has not been validated in all clinical situations. eGFR's persistently <60 mL/min signify possible Chronic Kidney Disease. Performed at [facility], [address] [place], [place] [address]   Lactic acid, plasma     Status:  None   Collection Time: 04/11/18  5:43 AM  Result Value Ref Range   Lactic Acid, Venous 1.9 0.5 - 1.9 mmol/L    Comment: Performed at [facility], [address] [place], [place] [address]    Studies/Results:  BRAIN MRI: -With and without contrast FINDINGS: Brain: Diffusion imaging does not show any acute or subacute infarction. The brainstem and cerebellum are normal. Cerebral hemispheres show minimal age related volume loss. There are a few punctate foci of T2 and FLAIR signal in the white matter, likely subclinical. No cortical abnormality. No mass lesion, hemorrhage, hydrocephalus or extra-axial collection. No abnormal contrast  enhancement.  Vascular: Major vessels at the base of the brain show flow.  Skull and upper cervical spine: Negative  Sinuses/Orbits: Clear/normal  Other: None  IMPRESSION: Normal MRI of the brain for age. No evidence of stroke. No evidence of mass lesion.    Lumbar spine MRI  FINDINGS: Segmentation:  5 lumbar type vertebral bodies.  Alignment:  2 mm retrolisthesis L4-5.  2 mm anterolisthesis L5-S1.  Vertebrae: No fracture or acute bone finding. Benign appearing 1 cm focus at the superior endplate of L2 is unchanged. As an isolated finding, this would be unlikely to represent metastatic disease. Chronic discogenic marrow changes seen elsewhere. Chronic inferior endplate Schmorl's node at L3.  Conus medullaris and cauda equina: Conus extends to the L1 level. There is chronic fat of the filum terminale, congenital finding not of clinical significance.  Paraspinal and other soft tissues: Negative  Disc levels:  No significant finding at L1-2 or above.  L2-3: Desiccation and bulging of the disc more towards the left. Mild left foraminal narrowing but without likely neural compression.  L3-4: Disc degeneration with a small central disc herniation. Mild facet and ligamentous hypertrophy. Mild multifactorial stenosis  at this level without definite neural compression.  L4-5: Retrolisthesis of 2 mm. Chronic disc degeneration with loss of disc height. Endplate osteophytes and bulging of the disc. No central canal stenosis. Foraminal stenosis on the right that could affect the exiting L4 nerve.  L5-S1: 2 mm of anterolisthesis. Chronic bilateral pars defects. No disc herniation. No compressive stenosis.  IMPRESSION: No change since the study of last month. No acute or traumatic finding.  L2-3: Asymmetric disc bulge on the left with mild left foraminal narrowing but no definite neural compression.  L3-4: Mild multifactorial stenosis largely secondary to a small central disc herniation. No definite neural compression however.  L4-5: Chronic disc degeneration and facet degeneration. Right foraminal stenosis that could affect the right L4 nerve.  L5-S1: Chronic bilateral pars defects at L5 with 2 mm anterolisthesis. No compressive stenosis.    THORACIC SPINE MRI with/without FINDINGS: MRI THORACIC SPINE FINDINGS  Alignment:  Physiologic.  Vertebrae: No fracture, evidence of discitis, or bone lesion. Specifically, no evidence of metastatic disease to the thoracic spine.  Cord:  Normal.  Paraspinal and other soft tissues: Normal.  Disc levels:  T1-2 through T6-7: Negative.  T7-8: Tiny broad-based disc bulge with no neural impingement.  T8-9 through T12-L1: Negative.  No spinal or foraminal stenosis.  IMPRESSION: No significant abnormality of the thoracic spine. Specifically, no evidence of metastatic disease to the thoracic spine. No abnormality of the thoracic spinal cord.       The MRI scans are reviewed.  The brain MRI is normal.  There is no significant abnormality seen on DWI, SWI or even FLAIR imaging.  No contrast enhancement is noted that of any significance.  Lumbar spine MRI shows multilevel disc bulging most notably at L3-L4 with the  spondylolisthesis of L3 on L4.  No significant compromise is noted to explain the patient's weakness however.  Lumbar MRI is unrevealing.  Aniqa Hare A. Merlene Holder, M.D.  Diplomate, Tax adviser of Psychiatry and Neurology ( Neurology). 04/11/2018, 6:19 PM

## 2018-04-11 NOTE — ED Notes (Signed)
CRITICAL VALUE ALERT  Critical Value:  Lactic acid 2.9  Date & Time Notied:  04/11/2018 0122  Provider Notified: [email or web address]  Orders Received/Actions taken: md notified

## 2018-04-12 ENCOUNTER — Inpatient Hospital Stay (HOSPITAL_COMMUNITY): Payer: BLUE CROSS/BLUE SHIELD

## 2018-04-12 DIAGNOSIS — I10 Essential (primary) hypertension: Secondary | ICD-10-CM | POA: Diagnosis not present

## 2018-04-12 DIAGNOSIS — M4802 Spinal stenosis, cervical region: Secondary | ICD-10-CM | POA: Diagnosis not present

## 2018-04-12 DIAGNOSIS — E872 Acidosis: Secondary | ICD-10-CM | POA: Diagnosis not present

## 2018-04-12 DIAGNOSIS — R29898 Other symptoms and signs involving the musculoskeletal system: Secondary | ICD-10-CM | POA: Diagnosis not present

## 2018-04-12 LAB — BASIC METABOLIC PANEL
Anion gap: 3 — ABNORMAL LOW (ref 5–15)
BUN: 11 mg/dL (ref 8–23)
CO2: 27 mmol/L (ref 22–32)
Calcium: 9 mg/dL (ref 8.9–10.3)
Chloride: 106 mmol/L (ref 98–111)
Creatinine, Ser: 0.81 mg/dL (ref 0.61–1.24)
GFR calc Af Amer: >60 mL/min (ref >60)
GFR calc non Af Amer: >60 mL/min (ref >60)
Glucose, Bld: 108 mg/dL — ABNORMAL HIGH (ref 70–99)
Potassium: 3.9 mmol/L (ref 3.5–5.1)
Sodium: 136 mmol/L (ref 135–145)

## 2018-04-12 LAB — URINE CULTURE
Culture: NO GROWTH
Special Requests: NORMAL

## 2018-04-12 LAB — CBC
HCT: 33 % — ABNORMAL LOW (ref 39.0–52.0)
Hemoglobin: 11.3 g/dL — ABNORMAL LOW (ref 13.0–17.0)
MCH: 34.3 pg — ABNORMAL HIGH (ref 26.0–34.0)
MCHC: 34.2 g/dL (ref 30.0–36.0)
MCV: 100.3 fL — ABNORMAL HIGH (ref 78.0–100.0)
Platelets: 138 10*3/uL — ABNORMAL LOW (ref 150–400)
RBC: 3.29 MIL/uL — ABNORMAL LOW (ref 4.22–5.81)
RDW: 12.4 % (ref 11.5–15.5)
WBC: 4.1 10*3/uL (ref 4.0–10.5)

## 2018-04-12 LAB — FOLATE: Folate: 7.1 ng/mL (ref >5.9)

## 2018-04-12 LAB — HIV ANTIBODY (ROUTINE TESTING W REFLEX): HIV Screen 4th Generation wRfx: NONREACTIVE

## 2018-04-12 MED ORDER — LORAZEPAM 2 MG/ML IJ SOLN
1.0000 mg | Freq: Once | INTRAMUSCULAR | Status: AC | PRN
  Administered 2018-04-12: 1 mg via INTRAMUSCULAR

## 2018-04-12 MED ORDER — ALBUTEROL SULFATE HFA 108 (90 BASE) MCG/ACT IN AERS: 2.0000 | INHALATION_SPRAY | Freq: Four times a day (QID) | RESPIRATORY_TRACT | Status: DC | PRN

## 2018-04-12 MED ORDER — LORAZEPAM 2 MG/ML IJ SOLN
1.0000 mg | Freq: Once | INTRAMUSCULAR | Status: DC | PRN
  Filled 2018-04-12: qty 1

## 2018-04-12 MED ORDER — CYANOCOBALAMIN 1000 MCG/ML IJ SOLN
1000.0000 ug | Freq: Once | INTRAMUSCULAR | Status: AC
  Administered 2018-04-12: 1000 ug via INTRAMUSCULAR
  Filled 2018-04-12: qty 1

## 2018-04-12 MED ORDER — BUDESONIDE-FORMOTEROL FUMARATE 160-4.5 MCG/ACT IN AERO: 2.0000 | INHALATION_SPRAY | Freq: Two times a day (BID) | RESPIRATORY_TRACT | Status: DC

## 2018-04-12 MED ORDER — AZELASTINE HCL 0.1 % NA SOLN: 2.0000 | Freq: Every morning | NASAL | Status: DC

## 2018-04-12 MED ORDER — OLMESARTAN MEDOXOMIL 40 MG PO TABS: 40.0000 mg | ORAL_TABLET | Freq: Every day | ORAL | Status: DC

## 2018-04-12 MED ORDER — VITAMIN B-12 1000 MCG PO TABS: 1000.0000 ug | ORAL_TABLET | Freq: Every day | ORAL | Status: DC

## 2018-04-12 NOTE — Progress Notes (Signed)
Cervical spine MRI is reviewed in person and discussed with the hospitalist.  The MRI shows disc osteophyte complex at C4-C5 which in encroaches on the spinal cord and possibly could be causing his symptoms  Although there are no abnormalities seen within the cord itself there is evidence of mild mass effect.  I did recommend that the patient be set up to see Neurosurgery as an outpatient.

## 2018-04-12 NOTE — Progress Notes (Signed)
Pt discharged home today per Dr. Johnson. Pt's IV site D/C'd and WDL. Pt's VSS. Pt provided with home medication list and discharge instructions. Verbalized understanding. Pt left floor via WC in stable condition accompanied by NT. 

## 2018-04-12 NOTE — Discharge Summary (Signed)
Physician Discharge Summary  MUHAMED LUECKE XBD:[id number] DOB: May 03, 1956 DOA: 04/10/2018  PCP: [name], MD  Admit date: 04/10/2018 Discharge date: 04/12/2018  Admitted From: HOME  Disposition: HOME   Recommendations for Outpatient Follow-up:  1. Follow up with PCP in 2 weeks 2. Please follow up with Neurosurgery on 04/20/18 at 2pm as scheduled with Dr. Kathyrn Sheriff  Discharge Condition: STABLE   CODE STATUS: FULL    Brief Hospitalization Summary: Please see all hospital notes, images, labs for full details of the hospitalization. HPI: [name] Holder is a 62 y.o. male with medical history significant for metastatic prostate cancer-currently on Lupron, asthma, and hypertension who was feeling fine earlier today until he came back home from walking his dogs after dinner.  He tried going up the steps to his house and he suddenly became weak in both lower extremities causing him to fall onto the steps of his home.  His legs were noted to be extremely weak and other than this, he did not have any other symptoms of lightheadedness, dizziness, headache, visual change, dysarthria, numbness, or tingling.  His wife called 41 and EMS was able to help the patient up to a standing position and he could stand, but had great difficulty ambulating.  He had to be transported on the stretcher to the ED.  Patient does not have any changes to his urination or bowel movements from his usual baseline.  Of note, he has had a recent lumbar MRI ordered by Dr. Lynann Bologna of orthopedics on account of persistent low back pain in the setting of prostate cancer.  This was done on 8/23 with noted L4/5 spondylolisthesis along with right L4/5 foraminal stenosis.  He is actually doing some physical therapy for his back at this time.  He denies any current back pain.   ED Course: Vital signs are stable with some soft blood pressure readings noted.  Lactic acid level is 2.9 and patient is noted to have some low-grade  temperature of 99 Fahrenheit.  He has been given 1.5 L fluid bolus as well as some nebulizer treatment.  He has had x-rays of his knees which have some minor abrasions and these are negative for any acute abnormalities.  CT of the head with no acute findings.  Patient was able to ambulate to the bathroom in the ED, but had a very broad-based gait.  The patient was admitted under observation.  He had an MRI of the thoracic and lumbar spine and brain and those tests did not reveal any acute findings.  There was no evidence of metastatic spread of prostate cancer.  The patient was seen in consultation by our neurologist Dr. Merlene Laughter who ordered an MRI of the cervical spine.  This MRI test revealed C4/C5 broad-based disc bulging causing mild mass-effect on the spinal cord and I spoke with Dr. Merlene Laughter regarding this and he reviewed the MRI and he recommended that the patient be referred to neurosurgery as this may be causing the patient's symptoms.  I was able to consult [facility] to arrange an outpatient appointment for the patient early next week on 04/20/2018 at 2 PM with Dr. Kathyrn Sheriff.  The patient and his wife were updated about this and they were told to try to arrive by 1:30 PM for paperwork.  In addition, [place] [facility] is planning to send the patient a new patient packet and appointment card through the mail to their home.  The patient was seen by the inpatient physical therapy team and  they have recommended that the patient resume outpatient physical therapy services and they have also recommended a rolling walker with 4 inch wheels she has been ordered prior to discharge.  In addition I gave the patient instructions to seek medical care or return to the emergency room if symptoms come back, worsen or new problems develop at this time the patient's symptoms have completely resolved and he has not had a recurrence of symptoms since he has been in the hospital.  I updated his wife and I  updated the patient and they verbalized understanding.   Discharge Diagnoses:  Principal Problem:   Weakness of both lower extremities Active Problems:   Essential hypertension   Asthma, mild intermittent   History of prostate cancer   Lactic acidosis   Weakness  Discharge Instructions: Discharge Instructions    Call MD for:  difficulty breathing, headache or visual disturbances   Complete by:  As directed    Call MD for:  extreme fatigue   Complete by:  As directed    Call MD for:  persistant dizziness or light-headedness   Complete by:  As directed    Call MD for:  severe uncontrolled pain   Complete by:  As directed    Diet - low sodium heart healthy   Complete by:  As directed    Increase activity slowly   Complete by:  As directed      Allergies as of 04/12/2018      Reactions   Amlodipine Swelling   edema   Tetracycline Rash      Medication List    TAKE these medications   abiraterone acetate 250 MG tablet Commonly known as:  ZYTIGA Take 1,000 mg by mouth daily.   albuterol 108 (90 Base) MCG/ACT inhaler Commonly known as:  PROVENTIL HFA;VENTOLIN HFA Inhale 2 puffs into the lungs every 6 (six) hours as needed for wheezing or shortness of breath.   azelastine 0.1 % nasal spray Commonly known as:  ASTELIN Place 2 sprays into both nostrils every morning.   budesonide-formoterol 160-4.5 MCG/ACT inhaler Commonly known as:  SYMBICORT Inhale 2 puffs into the lungs 2 (two) times daily. What changed:  See the new instructions.   CALCIUM-VITAMIN D PO Take 1 tablet by mouth 2 (two) times daily.   carvedilol 3.125 MG tablet Commonly known as:  COREG Take 1 tablet (3.125 mg total) by mouth 2 (two) times daily with a meal.   diclofenac sodium 1 % Gel Commonly known as:  VOLTAREN Apply 2 g topically daily as needed (for pain).   LUPRON IJ Inject 1 Dose as directed every 4 (four) months. This is administered by [facility]   mometasone 50 MCG/ACT nasal  spray Commonly known as:  NASONEX USE 1 TO 2 SPRAYS IN EACH NOSTRIL ONCE DAILY. What changed:    how much to take  how to take this  when to take this  additional instructions   olmesartan 40 MG tablet Commonly known as:  BENICAR Take 1 tablet (40 mg total) by mouth daily.   predniSONE 5 MG tablet Commonly known as:  DELTASONE Take 5 mg by mouth 2 (two) times daily.            Durable Medical Equipment  (From admission, onward)         Start     Ordered   04/12/18 1351  For home use only DME Walker rolling  Once    Question:  Patient needs a walker to treat  with the following condition  Answer:  Gait instability   04/12/18 1351         Follow-up Information    [name], MD. Schedule an appointment as soon as possible for a visit in 10 day(s).   Specialty:  Internal Medicine Why:  Hospital Follow Up  Contact information: [address] [address] [phone]        Consuella Lose, MD. Go on 04/20/2018.   Specialty:  Neurosurgery Why:  Appointment is at 2pm.  Please arrive at 1:30 pm for new patient paperwork.   Contact information: [address] [place] [address] [phone]          Allergies  Allergen Reactions  . Amlodipine Swelling    edema  . Tetracycline Rash   Allergies as of 04/12/2018      Reactions   Amlodipine Swelling   edema   Tetracycline Rash      Medication List    TAKE these medications   abiraterone acetate 250 MG tablet Commonly known as:  ZYTIGA Take 1,000 mg by mouth daily.   albuterol 108 (90 Base) MCG/ACT inhaler Commonly known as:  PROVENTIL HFA;VENTOLIN HFA Inhale 2 puffs into the lungs every 6 (six) hours as needed for wheezing or shortness of breath.   azelastine 0.1 % nasal spray Commonly known as:  ASTELIN Place 2 sprays into both nostrils every morning.   budesonide-formoterol 160-4.5 MCG/ACT inhaler Commonly known as:  SYMBICORT Inhale 2  puffs into the lungs 2 (two) times daily. What changed:  See the new instructions.   CALCIUM-VITAMIN D PO Take 1 tablet by mouth 2 (two) times daily.   carvedilol 3.125 MG tablet Commonly known as:  COREG Take 1 tablet (3.125 mg total) by mouth 2 (two) times daily with a meal.   diclofenac sodium 1 % Gel Commonly known as:  VOLTAREN Apply 2 g topically daily as needed (for pain).   LUPRON IJ Inject 1 Dose as directed every 4 (four) months. This is administered by [facility]   mometasone 50 MCG/ACT nasal spray Commonly known as:  NASONEX USE 1 TO 2 SPRAYS IN EACH NOSTRIL ONCE DAILY. What changed:    how much to take  how to take this  when to take this  additional instructions   olmesartan 40 MG tablet Commonly known as:  BENICAR Take 1 tablet (40 mg total) by mouth daily.   predniSONE 5 MG tablet Commonly known as:  DELTASONE Take 5 mg by mouth 2 (two) times daily.            Durable Medical Equipment  (From admission, onward)         Start     Ordered   04/12/18 1351  For home use only DME Walker rolling  Once    Question:  Patient needs a walker to treat with the following condition  Answer:  Gait instability   04/12/18 1351          Procedures/Studies: Dg Tibia/fibula Left  Result Date: 04/11/2018 CLINICAL DATA:  [place] down steps while walking dog. EXAM: LEFT KNEE - COMPLETE 4+ VIEW; LEFT TIBIA AND FIBULA - 2 VIEW COMPARISON:  None. FINDINGS: LEFT knee: No acute fracture deformity or dislocation. No destructive bony lesions. No advanced arthropathy. Small suprapatellar joint effusion. LEFT tibia and fibula: No acute fracture deformity or dislocation. No destructive bony lesions. Soft tissue planes are not suspicious. IMPRESSION: No acute fracture deformity or dislocation.  Small suprapatellar joint effusion. Electronically Signed   By: Elon Alas M.D.   On: 04/11/2018 02:47   Ct Head Wo Contrast  Result Date: 04/11/2018 CLINICAL DATA:  [place]  on steps while walking dog. EXAM: CT HEAD WITHOUT CONTRAST TECHNIQUE: Contiguous axial images were obtained from the base of the skull through the vertex without intravenous contrast. COMPARISON:  None. FINDINGS: BRAIN: No intraparenchymal hemorrhage, mass effect nor midline shift. Mild parenchymal brain volume loss. No acute large vascular territory infarcts. No abnormal extra-axial fluid collections. Basal cisterns are patent. VASCULAR: Mild calcific atherosclerosis of the carotid siphons. SKULL: No skull fracture. Anterior and posterior arch of C1 are developmentally unfused. Osteopenia. No significant scalp soft tissue swelling. SINUSES/ORBITS: Trace paranasal sinus mucosal thickening. Mastoid air cells are well aerated.The included ocular globes and orbital contents are non-suspicious. OTHER: None. IMPRESSION: 1. No acute intracranial process. 2. Mild parenchymal brain volume loss. Electronically Signed   By: Elon Alas M.D.   On: 04/11/2018 02:49   Mr Jeri Cos FK Contrast  Result Date: 04/11/2018 CLINICAL DATA:  History of metastatic prostate cancer. Sudden onset of weakness of the lower extremities yesterday with subsequent fall. EXAM: MRI HEAD WITHOUT AND WITH CONTRAST TECHNIQUE: Multiplanar, multiecho pulse sequences of the brain and surrounding structures were obtained without and with intravenous contrast. CONTRAST:  66mL MULTIHANCE GADOBENATE DIMEGLUMINE 529 MG/ML IV SOLN COMPARISON:  None. FINDINGS: Brain: Diffusion imaging does not show any acute or subacute infarction. The brainstem and cerebellum are normal. Cerebral hemispheres show minimal age related volume loss. There are a few punctate foci of T2 and FLAIR signal in the white matter, likely subclinical. No cortical abnormality. No mass lesion, hemorrhage, hydrocephalus or extra-axial collection. No abnormal contrast enhancement. Vascular: Major vessels at the base of the brain show flow. Skull and upper cervical spine: Negative  Sinuses/Orbits: Clear/normal Other: None IMPRESSION: Normal MRI of the brain for age. No evidence of stroke. No evidence of mass lesion. Electronically Signed   By: Nelson Chimes M.D.   On: 04/11/2018 09:48   Mr Cervical Spine Wo Contrast  Result Date: 04/12/2018 CLINICAL DATA:  62 year old male with metastatic prostate cancer. Sudden onset lower extremity weakness 2 days ago. EXAM: MRI CERVICAL SPINE WITHOUT CONTRAST TECHNIQUE: Multiplanar, multisequence MR imaging of the cervical spine was performed. No intravenous contrast was administered. COMPARISON:  Brain MRI, thoracic and lumbar spine MRI 04/11/2018. FINDINGS: Alignment: Straightening of cervical lordosis. No significant spondylolisthesis. Vertebrae: Visualized bone marrow signal is within normal limits. No osseous metastatic disease or marrow edema identified. Cord: Spinal cord signal is within normal limits at all visualized levels. Posterior Fossa, vertebral arteries, paraspinal tissues: Negative visible brain parenchyma. Preserved major vascular flow voids in the neck, the vertebral arteries are diminutive. Negative neck soft tissues. Disc levels: C2-C3: Small central disc protrusion and mild posterior disc bulging. Mild ligament flavum hypertrophy. Borderline to mild spinal stenosis. No spinal cord mass effect or foraminal stenosis. C3-C4:  Mild facet hypertrophy.  Mild left C4 foraminal stenosis. C4-C5: Disc space loss with circumferential disc bulge and endplate spurring eccentric to the right. Mild ligament flavum hypertrophy. Mild spinal stenosis and spinal cord mass effect. Mild left and moderate to severe right C5 foraminal stenosis. C5-C6: Mild circumferential disc bulge and endplate spurring eccentric to the right. No spinal stenosis. Mild to moderate left and moderate to severe right C6 foraminal stenosis. C6-C7: Mild circumferential disc bulge. Mild left C7 foraminal stenosis. C7-T1:  Negative. Negative visible upper thoracic spine.  IMPRESSION: 1.  No osseous metastatic disease in the cervical spine. 2. Mild degenerative spinal stenosis at C4-C5 with mild spinal cord mass effect but no cord signal abnormality. 3. Moderate to severe degenerative foraminal stenosis at the right C5 and right greater than left C6 nerve levels. Electronically Signed   By: Genevie Ann M.D.   On: 04/12/2018 09:37   Mr Thoracic Spine W Wo Contrast  Result Date: 04/11/2018 CLINICAL DATA:  Metastatic prostate cancer. Acute onset of lower extremity weakness causing him to fall on the steps of his home. EXAM: MRI THORACIC WITHOUT AND WITH CONTRAST TECHNIQUE: Sagittal multiecho pulse sequences of the thoracic spine were obtained without and with intravenous contrast. No axial images were obtained. CONTRAST:  31mL MULTIHANCE GADOBENATE DIMEGLUMINE 529 MG/ML IV SOLN COMPARISON:  CT scan of the abdomen and pelvis dated 03/31/2017 and bone scan dated 11/25/2016 FINDINGS: MRI THORACIC SPINE FINDINGS Alignment:  Physiologic. Vertebrae: No fracture, evidence of discitis, or bone lesion. Specifically, no evidence of metastatic disease to the thoracic spine. Cord:  Normal. Paraspinal and other soft tissues: Normal. Disc levels: T1-2 through T6-7: Negative. T7-8: Tiny broad-based disc bulge with no neural impingement. T8-9 through T12-L1: Negative. No spinal or foraminal stenosis. IMPRESSION: No significant abnormality of the thoracic spine. Specifically, no evidence of metastatic disease to the thoracic spine. No abnormality of the thoracic spinal cord. Electronically Signed   By: Lorriane Shire M.D.   On: 04/11/2018 09:53   Mr Lumbar Spine W Wo Contrast  Result Date: 04/11/2018 CLINICAL DATA:  History of prostate cancer. Sudden onset of lower extremity weakness yesterday with fall. EXAM: MRI LUMBAR SPINE WITHOUT AND WITH CONTRAST TECHNIQUE: Multiplanar and multiecho pulse sequences of the lumbar spine were obtained without and with intravenous contrast. CONTRAST:  36mL  MULTIHANCE GADOBENATE DIMEGLUMINE 529 MG/ML IV SOLN COMPARISON:  03/18/2018 FINDINGS: Segmentation:  5 lumbar type vertebral bodies. Alignment:  2 mm retrolisthesis L4-5.  2 mm anterolisthesis L5-S1. Vertebrae: No fracture or acute bone finding. Benign appearing 1 cm focus at the superior endplate of L2 is unchanged. As an isolated finding, this would be unlikely to represent metastatic disease. Chronic discogenic marrow changes seen elsewhere. Chronic inferior endplate Schmorl's node at L3. Conus medullaris and cauda equina: Conus extends to the L1 level. There is chronic fat of the filum terminale, congenital finding not of clinical significance. Paraspinal and other soft tissues: Negative Disc levels: No significant finding at L1-2 or above. L2-3: Desiccation and bulging of the disc more towards the left. Mild left foraminal narrowing but without likely neural compression. L3-4: Disc degeneration with a small central disc herniation. Mild facet and ligamentous hypertrophy. Mild multifactorial stenosis at this level without definite neural compression. L4-5: Retrolisthesis of 2 mm. Chronic disc degeneration with loss of disc height. Endplate osteophytes and bulging of the disc. No central canal stenosis. Foraminal stenosis on the right that could affect the exiting L4 nerve. L5-S1: 2 mm of anterolisthesis. Chronic bilateral pars defects. No disc herniation. No compressive stenosis. IMPRESSION: No change since the study of last month. No acute or traumatic finding. L2-3: Asymmetric disc bulge on the left with mild left foraminal narrowing but no definite neural compression. L3-4: Mild multifactorial stenosis largely secondary to a small central disc herniation. No definite neural compression however. L4-5: Chronic disc degeneration and facet degeneration. Right foraminal stenosis that could affect the right L4 nerve. L5-S1: Chronic bilateral pars defects at L5 with 2 mm anterolisthesis. No compressive stenosis.  Electronically Signed   By: Nelson Chimes  M.D.   On: 04/11/2018 10:00   Dg Knee Complete 4 Views Left  Result Date: 04/11/2018 CLINICAL DATA:  [place] down steps while walking dog. EXAM: LEFT KNEE - COMPLETE 4+ VIEW; LEFT TIBIA AND FIBULA - 2 VIEW COMPARISON:  None. FINDINGS: LEFT knee: No acute fracture deformity or dislocation. No destructive bony lesions. No advanced arthropathy. Small suprapatellar joint effusion. LEFT tibia and fibula: No acute fracture deformity or dislocation. No destructive bony lesions. Soft tissue planes are not suspicious. IMPRESSION: No acute fracture deformity or dislocation. Small suprapatellar joint effusion. Electronically Signed   By: Elon Alas M.D.   On: 04/11/2018 02:47      Subjective: Pt has been ambulating.  He has not had a recurrence of symptoms since admission.  He is wanting to go home.    Discharge Exam: Vitals:   04/12/18 0813 04/12/18 1329  BP:  (!) 147/76  Pulse:  88  Resp:  20  Temp:  98.7 F (37.1 C)  SpO2: 98% 99%   Vitals:   04/12/18 0704 04/12/18 0706 04/12/18 0813 04/12/18 1329  BP: 131/72 128/85  (!) 147/76  Pulse: 94 84  88  Resp: 17 18  20   Temp:    98.7 F (37.1 C)  TempSrc:    Oral  SpO2:  (!) 79% 98% 99%  Weight:      Height:        General: Pt is alert, awake, not in acute distress Cardiovascular: RRR, S1/S2 +, no rubs, no gallops Respiratory: CTA bilaterally, no wheezing, no rhonchi Abdominal: Soft, NT, ND, bowel sounds + Extremities: no edema, no cyanosis Neurological: nonfocal.    The results of significant diagnostics from this hospitalization (including imaging, microbiology, ancillary and laboratory) are listed below for reference.     Microbiology: Recent Results (from the past 240 hour(s))  Culture, blood (routine x 2)     Status: None (Preliminary result)   Collection Time: 04/10/18 11:44 PM  Result Value Ref Range Status   Specimen Description LEFT ANTECUBITAL  Final   Special Requests   Final     BOTTLES DRAWN AEROBIC AND ANAEROBIC Blood Culture adequate volume   Culture   Final    NO GROWTH 1 DAY Performed at [facility], [address] [place], [place] [address]    Report Status PENDING  Incomplete  Culture, blood (routine x 2)     Status: None (Preliminary result)   Collection Time: 04/10/18 11:49 PM  Result Value Ref Range Status   Specimen Description BLOOD RIGHT ARM  Final   Special Requests   Final    BOTTLES DRAWN AEROBIC AND ANAEROBIC Blood Culture results may not be optimal due to an inadequate volume of blood received in culture bottles   Culture   Final    NO GROWTH 1 DAY Performed at [facility], [address] [place], [place] [address]    Report Status PENDING  Incomplete  Urine culture     Status: None   Collection Time: 04/11/18 12:40 AM  Result Value Ref Range Status   Specimen Description   Final    URINE, CLEAN CATCH Performed at [facility], [address], [place], [place] [address]    Special Requests   Final    Normal Performed at [facility], [address] [place], [place] [address]    Culture   Final    NO GROWTH Performed at [facility], [place] [address], [place], [place] [address]    Report  Status 04/12/2018 FINAL  Final     Labs: BNP (last 3 results) No results for input(s): BNP in the last 8760 hours. Basic Metabolic Panel: Recent Labs  Lab 04/10/18 2343 04/11/18 0543 04/12/18 0533  NA 134*  --  136  K 3.8  --  3.9  CL 103  --  106  CO2 19*  --  27  GLUCOSE 97  --  108*  BUN 15  --  11  CREATININE 0.79 0.74 0.81  CALCIUM 9.3  --  9.0   Liver Function Tests: Recent Labs  Lab 04/10/18 2343  AST 21  ALT 20  ALKPHOS 39  BILITOT 0.8  PROT 7.0  ALBUMIN 4.2   No results for input(s): LIPASE, AMYLASE in the last 168 hours. No results for input(s): AMMONIA in the last 168 hours. CBC: Recent Labs  Lab 04/10/18 2343 04/11/18 0543 04/12/18 0533  WBC 6.3 5.4 4.1  NEUTROABS 3.6  --   --   HGB 13.1 11.0*  11.3*  HCT 37.6* 32.2* 33.0*  MCV 101.6* 102.2* 100.3*  PLT 217 158 138*   Cardiac Enzymes: No results for input(s): CKTOTAL, CKMB, CKMBINDEX, TROPONINI in the last 168 hours. BNP: Invalid input(s): POCBNP CBG: No results for input(s): GLUCAP in the last 168 hours. D-Dimer No results for input(s): DDIMER in the last 72 hours. Hgb A1c No results for input(s): HGBA1C in the last 72 hours. Lipid Profile No results for input(s): CHOL, HDL, LDLCALC, TRIG, CHOLHDL, LDLDIRECT in the last 72 hours. Thyroid function studies Recent Labs    04/10/18 2344  TSH 0.931   Anemia work up Recent Labs    04/10/18 2344 04/12/18 0533  VITAMINB12 178*  --   FOLATE  --  7.1   Urinalysis    Component Value Date/Time   COLORURINE YELLOW 04/11/2018 0040   APPEARANCEUR HAZY (A) 04/11/2018 0040   LABSPEC 1.014 04/11/2018 0040   PHURINE 5.0 04/11/2018 0040   GLUCOSEU NEGATIVE 04/11/2018 0040   GLUCOSEU NEGATIVE 08/31/2016 1030   HGBUR NEGATIVE 04/11/2018 0040   BILIRUBINUR NEGATIVE 04/11/2018 0040   KETONESUR NEGATIVE 04/11/2018 0040   PROTEINUR NEGATIVE 04/11/2018 0040   UROBILINOGEN 0.2 08/31/2016 1030   NITRITE NEGATIVE 04/11/2018 0040   LEUKOCYTESUR NEGATIVE 04/11/2018 0040   Sepsis Labs Invalid input(s): PROCALCITONIN,  WBC,  LACTICIDVEN Microbiology Recent Results (from the past 240 hour(s))  Culture, blood (routine x 2)     Status: None (Preliminary result)   Collection Time: 04/10/18 11:44 PM  Result Value Ref Range Status   Specimen Description LEFT ANTECUBITAL  Final   Special Requests   Final    BOTTLES DRAWN AEROBIC AND ANAEROBIC Blood Culture adequate volume   Culture   Final    NO GROWTH 1 DAY Performed at [facility], [address], [place], [place] [address]    Report Status PENDING  Incomplete  Culture, blood (routine x 2)     Status: None (Preliminary result)   Collection Time: 04/10/18 11:49 PM  Result Value Ref Range Status   Specimen Description BLOOD RIGHT  ARM  Final   Special Requests   Final    BOTTLES DRAWN AEROBIC AND ANAEROBIC Blood Culture results may not be optimal due to an inadequate volume of blood received in culture bottles   Culture   Final    NO GROWTH 1 DAY Performed at [facility], [address], [place], [place] [address]    Report Status PENDING  Incomplete  Urine culture  Status: None   Collection Time: 04/11/18 12:40 AM  Result Value Ref Range Status   Specimen Description   Final    URINE, CLEAN CATCH Performed at [facility], [address] [place], [place] [address]    Special Requests   Final    Normal Performed at [facility], [address], [place], [place] [address]    Culture   Final    NO GROWTH Performed at [facility], [place] [address] [place],  [address]    Report Status 04/12/2018 FINAL  Final    Time coordinating discharge:   SIGNED:  Irwin Brakeman, MD  [facility] 04/12/2018, 2:01 PM Pager [phone]  If 7PM-7AM, please contact night-coverage [email or web address] Password [email or web address]

## 2018-04-12 NOTE — Discharge Instructions (Signed)
Your C4/C5 disc in your neck is causing mild mass effect on your spinal cord and neurologist is recommending that you see a neurosurgeon.  I have made an appointment for you to see [facility] as a new patient on 04/20/18 at 2pm with Dr. Kathyrn Sheriff.  Please go to this appointment.  Please arrive by 1:30 pm.    Please seek medical care or return to emergency room if symptoms come back, worsen or new problems develop.    Fall Prevention in the Home Falls can cause injuries. They can happen to people of all ages. There are many things you can do to make your home safe and to help prevent falls. What can I do on the outside of my home?  Regularly fix the edges of walkways and driveways and fix any cracks.  Remove anything that might make you trip as you walk through a door, such as a raised step or threshold.  Trim any bushes or trees on the path to your home.  Use bright outdoor lighting.  Clear any walking paths of anything that might make someone trip, such as rocks or tools.  Regularly check to see if handrails are loose or broken. Make sure that both sides of any steps have handrails.  Any raised decks and porches should have guardrails on the edges.  Have any leaves, snow, or ice cleared regularly.  Use sand or salt on walking paths during winter.  Clean up any spills in your garage right away. This includes oil or grease spills. What can I do in the bathroom?  Use night lights.  Install grab bars by the toilet and in the tub and shower. Do not use towel bars as grab bars.  Use non-skid mats or decals in the tub or shower.  If you need to sit down in the shower, use a plastic, non-slip stool.  Keep the floor dry. Clean up any water that spills on the floor as soon as it happens.  Remove soap buildup in the tub or shower regularly.  Attach bath mats securely with double-sided non-slip rug tape.  Do not have throw rugs and other things on the floor that can make  you trip. What can I do in the bedroom?  Use night lights.  Make sure that you have a light by your bed that is easy to reach.  Do not use any sheets or blankets that are too big for your bed. They should not hang down onto the floor.  Have a firm chair that has side arms. You can use this for support while you get dressed.  Do not have throw rugs and other things on the floor that can make you trip. What can I do in the kitchen?  Clean up any spills right away.  Avoid walking on wet floors.  Keep items that you use a lot in easy-to-reach places.  If you need to reach something above you, use a strong step stool that has a grab bar.  Keep electrical cords out of the way.  Do not use floor polish or wax that makes floors slippery. If you must use wax, use non-skid floor wax.  Do not have throw rugs and other things on the floor that can make you trip. What can I do with my stairs?  Do not leave any items on the stairs.  Make sure that there are handrails on both sides of the stairs and use them. Fix handrails that are broken or loose. Make  sure that handrails are as long as the stairways.  Check any carpeting to make sure that it is firmly attached to the stairs. Fix any carpet that is loose or worn.  Avoid having throw rugs at the top or bottom of the stairs. If you do have throw rugs, attach them to the floor with carpet tape.  Make sure that you have a light switch at the top of the stairs and the bottom of the stairs. If you do not have them, ask someone to add them for you. What else can I do to help prevent falls?  Wear shoes that: ? Do not have high heels. ? Have rubber bottoms. ? Are comfortable and fit you well. ? Are closed at the toe. Do not wear sandals.  If you use a stepladder: ? Make sure that it is fully opened. Do not climb a closed stepladder. ? Make sure that both sides of the stepladder are locked into place. ? Ask someone to hold it for you, if  possible.  Clearly mark and make sure that you can see: ? Any grab bars or handrails. ? First and last steps. ? Where the edge of each step is.  Use tools that help you move around (mobility aids) if they are needed. These include: ? Canes. ? Walkers. ? Scooters. ? Crutches.  Turn on the lights when you go into a dark area. Replace any light bulbs as soon as they burn out.  Set up your furniture so you have a clear path. Avoid moving your furniture around.  If any of your floors are uneven, fix them.  If there are any pets around you, be aware of where they are.  Review your medicines with your doctor. Some medicines can make you feel dizzy. This can increase your chance of falling. Ask your doctor what other things that you can do to help prevent falls. This information is not intended to replace advice given to you by your health care provider. Make sure you discuss any questions you have with your health care provider. Document Released: 05/09/2009 Document Revised: 12/19/2015 Document Reviewed: 08/17/2014 Elsevier Interactive Patient Education  2018 [employer].   Spinal Stenosis Spinal stenosis happens when the open space (spinal canal) between the bones of your spine (vertebrae) gets smaller. It is caused by bone pushing into the open spaces of your backbone (spine). This puts pressure on your backbone and the nerves in your backbone. Treatment often focuses on managing any pain and symptoms. In some cases, surgery may be needed. Follow these instructions at home: Managing pain, stiffness, and swelling  Do all exercises and stretches as told by your doctor.  Stand and sit up straight (use good posture). If you were given a brace or a corset, wear it as told by your doctor.  Do not do any activities that cause pain. Ask your doctor what activities are safe for you.  Do not lift anything that is heavier than 10 lb (4.5 kg) or heavier than your doctor tells you.  Try to  stay at a healthy weight. Talk with your doctor if you need help losing weight.  If directed, put heat on the affected area as often as told by your doctor. Use the heat source that your doctor recommends, such as a moist heat pack or a heating pad. ? Put a towel between your skin and the heat source. ? Leave the heat on for 20-30 minutes. ? Remove the heat if your skin  turns bright red. This is especially important if you are not able to feel pain, heat, or cold. You may have a greater risk of getting burned. General instructions  Take over-the-counter and prescription medicines only as told by your doctor.  Do not use any products that contain nicotine or tobacco, such as cigarettes and e-cigarettes. If you need help quitting, ask your doctor.  Eat a healthy diet. This includes plenty of fruits and vegetables, whole grains, and low-fat (lean) protein.  Keep all follow-up visits as told by your doctor. This is important. Contact a doctor if:  Your symptoms do not get better.  Your symptoms get worse.  You have a fever. Get help right away if:  You have new or worse pain in your neck or upper back.  You have very bad pain that medicine does not control.  You are dizzy.  You have vision problems, blurred vision, or double vision.  You have a very bad headache that is worse when you stand.  You feel sick to your stomach (nauseous).  You throw up (vomit).  You have new or worse numbness or tingling in your back or legs.  You have pain, redness, swelling, or warmth in your arm or leg. Summary  Spinal stenosis happens when the open space (spinal canal) between the bones of your spine gets smaller (narrow).  Contact a doctor if your symptoms get worse.  In some cases, surgery may be needed. This information is not intended to replace advice given to you by your health care provider. Make sure you discuss any questions you have with your health care provider. Document  Released: 11/06/2010 Document Revised: 06/17/2016 Document Reviewed: 06/17/2016 Elsevier Interactive Patient Education  2017 [employer].

## 2018-04-12 NOTE — Care Management (Signed)
PT recommends OP PT, pt is already enrolled, and RW. Pt does not have RW and is interested in finding a used one in [place]. RN will print and give Rx for RW to pt and if he is unable to find used RW for purchase he will have order to have [place] billed at his local DME provider.

## 2018-04-12 NOTE — Evaluation (Signed)
Physical Therapy Evaluation Patient Details Name: [name] Holder MRN: [id number] DOB: Oct 22, 1955 Today's Date: 04/12/2018   History of Present Illness  [name] Holder is a 62yo male who comes to [facility] on 9/16 after sudden onset BLE weakness and collapse onto his porch. PMH: metastatic PrCA, Asthma, HTN, LBP current with [facility]. PTA pt was AMB communit ydistances without back pain or leg symptoms. Neuro consult has been palced for concerns regarding mets to CNS.   Clinical Impression  Pt admitted with above diagnosis. Pt currently with functional limitations due to the deficits listed below (see "PT Problem List"). Upon entry, pt in bed, no family/caregiver present. Grossly 5/5 strength BLE, Hoffman's sign negative, ankle plantarflexors with 1/4 Ashworth spasticity Rt>Lt. 1 instance of Left hip buckling with performance of stairs, pt able to self stabilize with use of external fixed object. Pt tolerating 400+ft ABM with RW without any noted abnormality in legs. Static balance screened and unremarkable. Functional mobility assessment demonstrates increased effort/time requirements, limited tolerance, and need for supervision for safety, whereas the patient performed these at a higher level of independence PTA. Pt will benefit from skilled PT intervention to increase independence and safety with basic mobility in preparation for discharge to the venue listed below.       Follow Up Recommendations Outpatient PT(Pt is current c OPPT at [facility], continue as allowed by physicican )    Equipment Recommendations  Rolling walker with 5" wheels    Recommendations for Other Services       Precautions / Restrictions Precautions Precautions: None;Fall Restrictions Weight Bearing Restrictions: No      Mobility  Bed Mobility Overal bed mobility: Modified Independent                Transfers Overall transfer level: Modified independent Equipment used: None             General  transfer comment: 5x STS: 13.6sec  Ambulation/Gait Ambulation/Gait assistance: Min guard Gait Distance (Feet): 400 Feet Assistive device: Rolling walker (2 wheeled)   Gait velocity: 0.80m/s    General Gait Details: decreased knee flexion in gait, some intermittent buckling of Right knee per patient.   Stairs Stairs: Yes Stairs assistance: Min guard Stair Management: One rail Left Number of Stairs: 5 General stair comments: hip buckling on left with first step, but then able to control   Wheelchair Mobility    Modified Rankin (Stroke Patients Only)       Balance Overall balance assessment: Mild deficits observed, not formally tested(narrow stance eyes closed >30sec)                                           Pertinent Vitals/Pain Pain Assessment: Faces Faces Pain Scale: Hurts even more Pain Location: "all over" dueto fall on stairs, but pain in left hip is better today than yesterday Pain Intervention(s): Limited activity within patient's tolerance;Monitored during session    Home Living Family/patient expects to be discharged to:: Private residence Living Arrangements: Spouse/significant other Available Help at Discharge: Family Type of Home: House Home Access: Stairs to enter Entrance Stairs-Rails: Can reach both;Left;Right Entrance Stairs-Number of Steps: 3   Home Equipment: None      Prior Function Level of Independence: Independent               Hand Dominance        Extremity/Trunk Assessment   Upper  Extremity Assessment Upper Extremity Assessment: Overall WFL for tasks assessed    Lower Extremity Assessment Lower Extremity Assessment: Overall WFL for tasks assessed(Grossly 5/5: Ankle DF with catch/release spasticity (Ashworth 1) Rt>Lt)    Cervical / Trunk Assessment Cervical / Trunk Assessment: Normal  Communication   Communication: No difficulties  Cognition Arousal/Alertness: Awake/alert Behavior During Therapy: WFL  for tasks assessed/performed Overall Cognitive Status: Within Functional Limits for tasks assessed                                        General Comments      Exercises     Assessment/Plan    PT Assessment Patient needs continued PT services  PT Problem List Decreased activity tolerance;Decreased mobility;Pain       PT Treatment Interventions Gait training;Stair training;Functional mobility training;Therapeutic activities;Therapeutic exercise;Patient/family education    PT Goals (Current goals can be found in the Care Plan section)  Acute Rehab PT Goals Patient Stated Goal: avoid falls  PT Goal Formulation: With patient Time For Goal Achievement: 04/26/18 Potential to Achieve Goals: Good    Frequency Min 3X/week   Barriers to discharge        Co-evaluation               AM-PAC PT "6 Clicks" Daily Activity  Outcome Measure Difficulty turning over in bed (including adjusting bedclothes, sheets and blankets)?: A Little Difficulty moving from lying on back to sitting on the side of the bed? : A Little Difficulty sitting down on and standing up from a chair with arms (e.g., wheelchair, bedside commode, etc,.)?: A Little Help needed moving to and from a bed to chair (including a wheelchair)?: None Help needed walking in hospital room?: A Little Help needed climbing 3-5 steps with a railing? : A Little 6 Click Score: 19    End of Session Equipment Utilized During Treatment: Gait belt Activity Tolerance: Patient tolerated treatment well;No increased pain Patient left: in bed;with call bell/phone within reach Nurse Communication: Mobility status PT Visit Diagnosis: Unsteadiness on feet (R26.81);Other abnormalities of gait and mobility (R26.89);Difficulty in walking, not elsewhere classified (R26.2);Other symptoms and signs involving the nervous system (R29.898)    Time: 1000-1019 PT Time Calculation (min) (ACUTE ONLY): 19 min   Charges:   PT  Evaluation $PT Eval Moderate Complexity: 1 Mod PT Treatments $Therapeutic Activity: 8-22 mins       1:18 PM, 04/12/18 Etta Grandchild, PT, DPT Physical Therapist - [place] [phone] (Office)    Nikkolas Coomes C 04/12/2018, 1:15 PM

## 2018-04-13 ENCOUNTER — Telehealth: Payer: Self-pay

## 2018-04-13 LAB — HOMOCYSTEINE: Homocysteine: 16.1 umol/L — ABNORMAL HIGH (ref 0.0–15.0)

## 2018-04-13 NOTE — Telephone Encounter (Signed)
Transition Care Management Follow-up Telephone Call  How have you been since you were released from the hospital? 04/12/2018  Do you understand why you were in the hospital? Yes  Do you understand the discharge instrcutions? Yes  Items Reviewed:  Medications reviewed: Yes  Allergies reviewed: Yes  Dietary changes reviewed: Yes  Referrals reviewed: Yes  Functional Questionnaire:  Activities of Daily Living (ADLs):   States they are independent in the following: All ADL's States they require assistance with the following: n/a   Any transportation issues/concerns?: No  Any patient concerns? Not at this time   Confirmed importance and date/time of follow-up visits scheduled: Pt did not want to schedule with PCP.   Confirmed with patient if condition begins to worsen call PCP or go to the ER.  Patient was given the Call-a-Nurse line [phone]: Yes

## 2018-04-14 ENCOUNTER — Encounter: Payer: BLUE CROSS/BLUE SHIELD | Admitting: Physical Therapy

## 2018-04-15 ENCOUNTER — Encounter: Payer: Self-pay | Admitting: Internal Medicine

## 2018-04-15 DIAGNOSIS — E538 Deficiency of other specified B group vitamins: Secondary | ICD-10-CM

## 2018-04-16 LAB — CULTURE, BLOOD (ROUTINE X 2)
Culture: NO GROWTH
Culture: NO GROWTH
Special Requests: ADEQUATE

## 2018-04-19 MED ORDER — CYANOCOBALAMIN 1000 MCG/ML IJ SOLN: 1000.0000 ug | Freq: Once | INTRAMUSCULAR | 5 refills | Status: AC

## 2018-04-19 MED ORDER — SYRINGE/NEEDLE (DISP) 3 ML MISC: 0 refills | Status: DC

## 2018-04-21 ENCOUNTER — Ambulatory Visit: Payer: BLUE CROSS/BLUE SHIELD | Admitting: Physical Therapy

## 2018-04-21 ENCOUNTER — Encounter: Payer: Self-pay | Admitting: Physical Therapy

## 2018-04-21 DIAGNOSIS — M6281 Muscle weakness (generalized): Secondary | ICD-10-CM

## 2018-04-21 DIAGNOSIS — M545 Low back pain, unspecified: Secondary | ICD-10-CM

## 2018-04-21 DIAGNOSIS — G8929 Other chronic pain: Secondary | ICD-10-CM

## 2018-04-21 NOTE — Therapy (Signed)
[facility] [place], [place], [address] Phone: [phone]   Fax:  [phone]  Physical Therapy Treatment  Patient Details  Name: [name] Holder MRN: [id number] Date of Birth: January 31, 1956 Referring Provider: Phylliss Bob MD   Encounter Date: 04/21/2018  PT End of Session - 04/21/18 0909    Visit Number  4    Number of Visits  13    PT Start Time  0903    PT Stop Time  0945    PT Time Calculation (min)  42 min    Activity Tolerance  Patient tolerated treatment well;No increased pain    Behavior During Therapy  WFL for tasks assessed/performed       Past Medical History:  Diagnosis Date  . Allergy   . Asthma   . Elevated PSA 10/03/2012   23.37  . History of kidney stones   . History of kidney stones   . Hx of colonic polyps   . Hypertension   . Osteopenia   . Prostate cancer [facility])     Past Surgical History:  Procedure Laterality Date  . COLONOSCOPY    . HERNIA REPAIR  ?2008  . LYMPHADENECTOMY Left 01/05/2013   Procedure: ROBOTIC LYMPHADENECTOMY;  Surgeon: Dutch Gray, MD;  Location: [facility]S;  Service: Urology;  Laterality: Left;  . PROSTATE BIOPSY     11/12 positive biopsies  . ROTATOR CUFF REPAIR  2012    There were no vitals filed for this visit.  Subjective Assessment - 04/21/18 0905    Subjective  Patient arrived to clinic with reports of clean bill of health from neurosurgeon yesterday. Reports continued stiffness in the mornings.    Pertinent History  Prostate CA, pt currently undergoing treatment.  HTN, asthma (has inhaler),     Limitations  Lifting    Currently in Pain?  No/denies         [facility] PT Assessment - 04/21/18 0001      Assessment   Medical Diagnosis  low back pain    Hand Dominance  Right    Prior Therapy  no      Precautions   Precautions  Other (comment)    Precaution Comments  NO [place] OR E-STIM Pt is currently undergoing treatments for metastic prostate CA      Restrictions   Weight Bearing Restrictions  No                   [facility] Adult PT Treatment/Exercise - 04/21/18 0001      Exercises   Exercises  Lumbar      Lumbar Exercises: Stretches   Passive Hamstring Stretch  Right;Left;3 reps;30 seconds    Lower Trunk Rotation  5 reps;10 seconds    Piriformis Stretch  Right;Left;3 reps;30 seconds      Lumbar Exercises: Aerobic   Nustep  L4 x12 min      Lumbar Exercises: Standing   Row  Strengthening;Both;15 reps    Row Limitations  Pink XTS    Shoulder Extension  Strengthening;Both;15 reps    Shoulder Extension Limitations  Pink XTS    Other Standing Lumbar Exercises  B chop wood pink XTS x15 reps      Lumbar Exercises: Supine   Bent Knee Raise  20 reps    Bridge  20 reps    Straight Leg Raise  20 reps      Lumbar Exercises: Sidelying   Clam  Both;20 reps  PT Education - 04/21/18 1005    Education Details  HEP- Piriformis stretch, LTR, march, SLR    Person(s) Educated  Patient    Methods  Handout    Comprehension  Verbalized understanding          PT Long Term Goals - 04/21/18 1044      PT LONG TERM GOAL #1   Title  Pt will be indepedent in his HEP and progression.     Time  6    Period  Weeks    Status  Achieved      PT LONG TERM GOAL #2   Title  Pt will be able to improve his hamstring flexibiltiy to >/= 70 degrees bilaterally in order to improve functional mobility.     Time  6    Period  Weeks    Status  On-going      PT LONG TERM GOAL #3   Title  pt will be able to demonstrate correct lifting techniques for 20 lb object.     Time  6    Period  Weeks    Status  On-going      PT LONG TERM GOAL #4   Title  pt will report pain </= 2/10 with walking 1 mile.     Baseline  pain 6/10 when walking his dogs in the evening.     Time  6    Period  Weeks    Status  On-going            Plan - 04/21/18 1005    Clinical Impression Statement  Patient tolerated today's treatment  fairly well as he returned to PT since a fall and hospitalization. Patient reports not completing HEP recently as he was afraid to until he was medically cleared. Patient able to report minimal dull pain in low back still after walking the dogs. No complaints or pain reported by patient during any exercises. Core activation prompted by PTA through standing and supine exercises. Patient provided handout for advanced HEP today and educated regarding the techniques during the treatment as they were completed during today's session. Patient denied any adverse symptoms following end of treatment.    Rehab Potential  Good    Clinical Impairments Affecting Rehab Potential  NO E-stim or [place]    PT Frequency  2x / week    PT Duration  6 weeks    PT Treatment/Interventions  Cryotherapy;Moist Heat;Functional mobility training;Therapeutic activities;Stair training;Gait training;Therapeutic exercise;Balance training;Patient/family education;Manual techniques;Passive range of motion;Taping    PT Next Visit Plan  Cont with plan of care; Nustep, lumbar stretching, core strengthening,     PT Home Exercise Plan  hamstring stretches, PPT, bridges, posture correction in standing; Piriformis stretch, LTR, march, SLR    Consulted and Agree with Plan of Care  Patient       Patient will benefit from skilled therapeutic intervention in order to improve the following deficits and impairments:  Pain, Postural dysfunction, Impaired flexibility, Decreased range of motion, Difficulty walking, Decreased activity tolerance  Visit Diagnosis: Chronic bilateral low back pain without sciatica  Muscle weakness (generalized)     Problem List Patient Active Problem List   Diagnosis Date Noted  . Spinal stenosis in cervical region 04/12/2018  . Weakness 04/11/2018  . Elevated LFTs 09/16/2017  . Routine general medical examination at a health care facility 08/29/2015  . Hyperlipidemia with target LDL less than 100 12/21/2013  .  Other abnormal glucose 12/21/2013  . Upper airway cough syndrome  07/25/2013  . History of prostate cancer   . Essential hypertension 05/20/2010  . Allergic rhinitis 05/20/2010  . Asthma, mild intermittent 05/20/2010    Standley Brooking, PTA 04/21/2018, 10:44 AM  [facility] [address] [place], [place], [address] Phone: [phone]   Fax:  [phone]  Name: [name] Holder MRN: [id number] Date of Birth: Jul 18, 1956

## 2018-04-25 ENCOUNTER — Ambulatory Visit: Payer: BLUE CROSS/BLUE SHIELD | Admitting: Physical Therapy

## 2018-04-25 ENCOUNTER — Encounter: Payer: Self-pay | Admitting: Physical Therapy

## 2018-04-25 DIAGNOSIS — M545 Low back pain: Secondary | ICD-10-CM | POA: Diagnosis not present

## 2018-04-25 DIAGNOSIS — M6281 Muscle weakness (generalized): Secondary | ICD-10-CM

## 2018-04-25 DIAGNOSIS — G8929 Other chronic pain: Secondary | ICD-10-CM

## 2018-04-25 NOTE — Therapy (Signed)
[facility] [place], [place], [address] Phone: [phone]   Fax:  [phone]  Physical Therapy Treatment  Patient Details  Name: [name] Holder MRN: [id number] Date of Birth: September 21, 1955 Referring Provider (PT): Phylliss Bob MD   Encounter Date: 04/25/2018  PT End of Session - 04/25/18 0903    Visit Number  5    Number of Visits  13    PT Start Time  0902    PT Stop Time  0944    PT Time Calculation (min)  42 min    Activity Tolerance  Patient tolerated treatment well    Behavior During Therapy  [name] Holder for tasks assessed/performed       Past Medical History:  Diagnosis Date  . Allergy   . Asthma   . Elevated PSA 10/03/2012   23.37  . History of kidney stones   . History of kidney stones   . Hx of colonic polyps   . Hypertension   . Osteopenia   . Prostate cancer [name] Holder)     Past Surgical History:  Procedure Laterality Date  . COLONOSCOPY    . HERNIA REPAIR  ?2008  . LYMPHADENECTOMY Left 01/05/2013   Procedure: ROBOTIC LYMPHADENECTOMY;  Surgeon: Dutch Gray, MD;  Location: [facility];  Service: Urology;  Laterality: Left;  . PROSTATE BIOPSY     11/12 positive biopsies  . ROTATOR CUFF REPAIR  2012    There were no vitals filed for this visit.  Subjective Assessment - 04/25/18 0902    Subjective  Reports a bad weekend as him and his sister were cleaning out his mother's house. There was a lot of lifting boxes.    Pertinent History  Prostate CA, pt currently undergoing treatment.  HTN, asthma (has inhaler),     Limitations  Lifting    Currently in Pain?  Yes    Pain Score  2     Pain Location  Back    Pain Orientation  Lower    Pain Descriptors / Indicators  Discomfort    Pain Type  Chronic pain    Pain Onset  More than a month ago         [name] Holder PT Assessment - 04/25/18 0001      Assessment   Medical Diagnosis  low back pain    Hand Dominance  Right    Prior Therapy  no      Precautions    Precautions  Other (comment)    Precaution Comments  NO [place] OR E-STIM Pt is currently undergoing treatments for metastic prostate CA      Restrictions   Weight Bearing Restrictions  No      Flexibility   Soft Tissue Assessment /Muscle Length  yes    Hamstrings  R: 67 deg, L: 60 deg                   [facility] Adult PT Treatment/Exercise - 04/25/18 0001      Therapeutic Activites    Therapeutic Activities  Lifting    Lifting  Lifting 14# box from waist height table and education in pivoting for lifting      Exercises   Exercises  Lumbar      Lumbar Exercises: Stretches   Passive Hamstring Stretch  Right;Left;3 reps;30 seconds    Lower Trunk Rotation  5 reps;10 seconds      Lumbar Exercises: Aerobic   Nustep  L5 x13 min      Lumbar  Exercises: Standing   Wall Slides  15 reps;2 seconds    Row  Strengthening;Both;20 reps    Row Limitations  Pink XTS    Shoulder Extension  Strengthening;Both;20 reps    Shoulder Extension Limitations  Pink XTS    Other Standing Lumbar Exercises  B chop wood pink XTS x15 reps      Lumbar Exercises: Supine   Bridge  20 reps;5 seconds                  PT Long Term Goals - 04/25/18 0932      PT LONG TERM GOAL #1   Title  Pt will be indepedent in his HEP and progression.     Time  6    Period  Weeks    Status  Achieved      PT LONG TERM GOAL #2   Title  Pt will be able to improve his hamstring flexibiltiy to >/= 70 degrees bilaterally in order to improve functional mobility.     Time  6    Period  Weeks    Status  On-going      PT LONG TERM GOAL #3   Title  pt will be able to demonstrate correct lifting techniques for 20 lb object.     Time  6    Period  Weeks    Status  On-going      PT LONG TERM GOAL #4   Title  pt will report pain </= 2/10 with walking 1 mile.     Baseline  pain 6/10 when walking his dogs in the evening.     Time  6    Period  Weeks    Status  On-going   3-4/10 "on a bad day" 04/25/2018            Plan - 04/25/18 0952    Clinical Impression Statement  Patient tolerated today's treatment well although arriving with reports of increased pain over the weekend and this morning. Patient instructed with core activation throughout treatment. Patient able to demonstrate improvement in B HS flexibility although not yet goal status. Patient also educated regarding proper lifting technique from waist height initially but also educated in pivoting and not rotating. Improvements also reported via patient in regards to LBP with ambulation. Patient reported his low back feeling better than this morning upon end of treatment.    Rehab Potential  Good    Clinical Impairments Affecting Rehab Potential  NO E-stim or [place]    PT Frequency  2x / week    PT Duration  6 weeks    PT Treatment/Interventions  Cryotherapy;Moist Heat;Functional mobility training;Therapeutic activities;Stair training;Gait training;Therapeutic exercise;Balance training;Patient/family education;Manual techniques;Passive range of motion;Taping    PT Next Visit Plan  Cont with plan of care; Nustep, lumbar stretching, core strengthening,     PT Home Exercise Plan  hamstring stretches, PPT, bridges, posture correction in standing; Piriformis stretch, LTR, march, SLR    Consulted and Agree with Plan of Care  Patient       Patient will benefit from skilled therapeutic intervention in order to improve the following deficits and impairments:  Pain, Postural dysfunction, Impaired flexibility, Decreased range of motion, Difficulty walking, Decreased activity tolerance  Visit Diagnosis: Chronic bilateral low back pain without sciatica  Muscle weakness (generalized)     Problem List Patient Active Problem List   Diagnosis Date Noted  . Spinal stenosis in cervical region 04/12/2018  . Weakness 04/11/2018  . Elevated LFTs 09/16/2017  .  Routine general medical examination at a health care facility 08/29/2015  . Hyperlipidemia  with target LDL less than 100 12/21/2013  . Other abnormal glucose 12/21/2013  . Upper airway cough syndrome 07/25/2013  . History of prostate cancer   . Essential hypertension 05/20/2010  . Allergic rhinitis 05/20/2010  . Asthma, mild intermittent 05/20/2010    [name] Brooking, PTA 04/25/2018, 10:00 AM  [facility] [address] [place], [place], [address] Phone: [phone]   Fax:  [phone]  Name: [name] Holder MRN: [id number] Date of Birth: 08-05-1955

## 2018-04-27 LAB — PSA: PSA: 0.52

## 2018-04-28 ENCOUNTER — Ambulatory Visit: Payer: BLUE CROSS/BLUE SHIELD | Attending: Orthopedic Surgery | Admitting: Physical Therapy

## 2018-04-28 DIAGNOSIS — G8929 Other chronic pain: Secondary | ICD-10-CM | POA: Diagnosis present

## 2018-04-28 DIAGNOSIS — M6281 Muscle weakness (generalized): Secondary | ICD-10-CM

## 2018-04-28 DIAGNOSIS — M545 Low back pain, unspecified: Secondary | ICD-10-CM

## 2018-04-28 NOTE — Therapy (Signed)
[facility] [place], [place], [address] Phone: [phone]   Fax:  [phone]  Physical Therapy Treatment  Patient Details  Name: [name] Holder MRN: [id number] Date of Birth: [birth year] Referring Provider (PT): Phylliss Bob MD   Encounter Date: 04/28/2018  PT End of Session - 04/28/18 0920    Visit Number  6    Number of Visits  13    Date for PT Re-Evaluation  05/14/18    PT Start Time  0855    PT Stop Time  0940    PT Time Calculation (min)  45 min    Activity Tolerance  Patient tolerated treatment well    Behavior During Therapy  [facility] for tasks assessed/performed       Past Medical History:  Diagnosis Date  . Allergy   . Asthma   . Elevated PSA 10/03/2012   23.37  . History of kidney stones   . History of kidney stones   . Hx of colonic polyps   . Hypertension   . Osteopenia   . Prostate cancer [facility]     Past Surgical History:  Procedure Laterality Date  . COLONOSCOPY    . HERNIA REPAIR  ?2008  . LYMPHADENECTOMY Left 01/05/2013   Procedure: ROBOTIC LYMPHADENECTOMY;  Surgeon: Dutch Gray, MD;  Location: [facility];  Service: Urology;  Laterality: Left;  . PROSTATE BIOPSY     11/12 positive biopsies  . ROTATOR CUFF REPAIR  2012    There were no vitals filed for this visit.  Subjective Assessment - 04/28/18 0901    Subjective  Pt relays he is having a good day with his back, he relays no pain upon arrival.    Pertinent History  Prostate CA, pt currently undergoing treatment.  HTN, asthma (has inhaler),     Limitations  Lifting    Currently in Pain?  No/denies                       [facility] Adult PT Treatment/Exercise - 04/28/18 0001      Therapeutic Activites    Therapeutic Activities  Lifting    Lifting  Lifting 14# box from floor to waist ht table with education in pivoting for lifting X 5 reps up/down      Exercises   Exercises  Lumbar      Lumbar Exercises: Stretches   Passive Hamstring Stretch  Right;Left;3 reps;30 seconds    Lower Trunk Rotation  5 reps;10 seconds      Lumbar Exercises: Aerobic   Nustep  L5 x10 min      Lumbar Exercises: Standing   Wall Slides  15 reps;2 seconds   squats   Row  Strengthening;Both;20 reps    Row Limitations  Pink XTS    Shoulder Extension  Strengthening;Both;20 reps    Shoulder Extension Limitations  Pink XTS    Other Standing Lumbar Exercises  B chop wood pink XTS x15 reps    Other Standing Lumbar Exercises  pilof press/anti rotation green TB X 15 biilat      Lumbar Exercises: Supine   Bent Knee Raise  20 reps    Bridge  20 reps;5 seconds    Straight Leg Raise  20 reps                  PT Long Term Goals - 04/25/18 0932      PT LONG TERM GOAL #1   Title  Pt will be  indepedent in his HEP and progression.     Time  6    Period  Weeks    Status  Achieved      PT LONG TERM GOAL #2   Title  Pt will be able to improve his hamstring flexibiltiy to >/= 70 degrees bilaterally in order to improve functional mobility.     Time  6    Period  Weeks    Status  On-going      PT LONG TERM GOAL #3   Title  pt will be able to demonstrate correct lifting techniques for 20 lb object.     Time  6    Period  Weeks    Status  On-going      PT LONG TERM GOAL #4   Title  pt will report pain </= 2/10 with walking 1 mile.     Baseline  pain 6/10 when walking his dogs in the evening.     Time  6    Period  Weeks    Status  On-going   3-4/10 "on a bad day" 04/25/2018           Plan - 04/28/18 0942    Clinical Impression Statement  Pt had no complaints or pain with activity and exercise today, he was able to progress his lifting and show good body mechanics after edu for technique. He is now making good progress after recent flare up earlier in the week.     Rehab Potential  Good    Clinical Impairments Affecting Rehab Potential  NO E-stim or [place]    PT Frequency  2x / week    PT Duration  6 weeks    PT  Treatment/Interventions  Cryotherapy;Moist Heat;Functional mobility training;Therapeutic activities;Stair training;Gait training;Therapeutic exercise;Balance training;Patient/family education;Manual techniques;Passive range of motion;Taping    PT Next Visit Plan  Cont with plan of care; Nustep, lumbar stretching, core strengthening,     PT Home Exercise Plan  hamstring stretches, PPT, bridges, posture correction in standing; Piriformis stretch, LTR, march, SLR    Consulted and Agree with Plan of Care  Patient       Patient will benefit from skilled therapeutic intervention in order to improve the following deficits and impairments:  Pain, Postural dysfunction, Impaired flexibility, Decreased range of motion, Difficulty walking, Decreased activity tolerance  Visit Diagnosis: Chronic bilateral low back pain without sciatica  Muscle weakness (generalized)     Problem List Patient Active Problem List   Diagnosis Date Noted  . Spinal stenosis in cervical region 04/12/2018  . Weakness 04/11/2018  . Elevated LFTs 09/16/2017  . Routine general medical examination at a health care facility 08/29/2015  . Hyperlipidemia with target LDL less than 100 12/21/2013  . Other abnormal glucose 12/21/2013  . Upper airway cough syndrome 07/25/2013  . History of prostate cancer   . Essential hypertension 05/20/2010  . Allergic rhinitis 05/20/2010  . Asthma, mild intermittent 05/20/2010    Debbe Odea, PT, DPT 04/28/2018, 9:45 AM  [facility] [address] [place], [place], [address] Phone: [phone]   Fax:  [phone]  Name: [name] Holder MRN: [id number] Date of Birth: 19-Jun-1956

## 2018-05-02 ENCOUNTER — Ambulatory Visit: Payer: BLUE CROSS/BLUE SHIELD | Admitting: Physical Therapy

## 2018-05-02 ENCOUNTER — Encounter: Payer: Self-pay | Admitting: Physical Therapy

## 2018-05-02 DIAGNOSIS — M545 Low back pain: Principal | ICD-10-CM

## 2018-05-02 DIAGNOSIS — G8929 Other chronic pain: Secondary | ICD-10-CM

## 2018-05-02 DIAGNOSIS — M6281 Muscle weakness (generalized): Secondary | ICD-10-CM

## 2018-05-02 NOTE — Therapy (Signed)
[facility] [place], [place], [address] Phone: [phone]   Fax:  [phone]  Physical Therapy Treatment  Patient Details  Name: [name] Holder MRN: [id number] Date of Birth: Dec 06, 1955 Referring Provider (PT): Phylliss Bob MD   Encounter Date: 05/02/2018  PT End of Session - 05/02/18 0902    Visit Number  7    Number of Visits  13    Date for PT Re-Evaluation  05/14/18    PT Start Time  0901    PT Stop Time  0945    PT Time Calculation (min)  44 min    Activity Tolerance  Patient tolerated treatment well    Behavior During Therapy  [facility] for tasks assessed/performed       Past Medical History:  Diagnosis Date  . Allergy   . Asthma   . Elevated PSA 10/03/2012   23.37  . History of kidney stones   . History of kidney stones   . Hx of colonic polyps   . Hypertension   . Osteopenia   . Prostate cancer [facility])     Past Surgical History:  Procedure Laterality Date  . COLONOSCOPY    . HERNIA REPAIR  ?2008  . LYMPHADENECTOMY Left 01/05/2013   Procedure: ROBOTIC LYMPHADENECTOMY;  Surgeon: Dutch Gray, MD;  Location: [facility];  Service: Urology;  Laterality: Left;  . PROSTATE BIOPSY     11/12 positive biopsies  . ROTATOR CUFF REPAIR  2012    There were no vitals filed for this visit.  Subjective Assessment - 05/02/18 0901    Subjective  Woke up with 2-3/10 LBP.    Pertinent History  Prostate CA, pt currently undergoing treatment.  HTN, asthma (has inhaler),     Limitations  Lifting    Currently in Pain?  No/denies         [facility] PT Assessment - 05/02/18 0001      Assessment   Medical Diagnosis  low back pain    Hand Dominance  Right    Prior Therapy  no      Precautions   Precautions  Other (comment)    Precaution Comments  NO [place] OR E-STIM Pt is currently undergoing treatments for metastic prostate CA      Restrictions   Weight Bearing Restrictions  No                   [facility] Adult PT  Treatment/Exercise - 05/02/18 0001      Therapeutic Activites    Therapeutic Activities  Lifting    Lifting  Lifting 20# from table <> floor x5 reps      Exercises   Exercises  Lumbar      Lumbar Exercises: Stretches   Passive Hamstring Stretch  Right;Left;3 reps;30 seconds    Lower Trunk Rotation  5 reps;10 seconds      Lumbar Exercises: Aerobic   Nustep  L5 x13 min      Lumbar Exercises: Machines for Strengthening   Cybex Lumbar Extension  70# 2x10 reps      Lumbar Exercises: Standing   Wall Slides  20 reps    Row  Strengthening;Both;20 reps    Row Limitations  Pink XTS    Shoulder Extension  Strengthening;Both;20 reps    Shoulder Extension Limitations  Pink XTS    Other Standing Lumbar Exercises  B chop wood pink XTS x20 reps    Other Standing Lumbar Exercises  B multifidi strengthening green theraband x10  reps each      Lumbar Exercises: Supine   Bridge  20 reps;5 seconds    Straight Leg Raise  20 reps      Lumbar Exercises: Sidelying   Clam  Both;15 reps                  PT Long Term Goals - 04/25/18 0932      PT LONG TERM GOAL #1   Title  Pt will be indepedent in his HEP and progression.     Time  6    Period  Weeks    Status  Achieved      PT LONG TERM GOAL #2   Title  Pt will be able to improve his hamstring flexibiltiy to >/= 70 degrees bilaterally in order to improve functional mobility.     Time  6    Period  Weeks    Status  On-going      PT LONG TERM GOAL #3   Title  pt will be able to demonstrate correct lifting techniques for 20 lb object.     Time  6    Period  Weeks    Status  On-going      PT LONG TERM GOAL #4   Title  pt will report pain </= 2/10 with walking 1 mile.     Baseline  pain 6/10 when walking his dogs in the evening.     Time  6    Period  Weeks    Status  On-going   3-4/10 "on a bad day" 04/25/2018           Plan - 05/02/18 0952    Clinical Impression Statement  Patient arrived to clinic with low level  LBP upon waking this am but no pain upon arrival to PT clinic. Patient progressed to more advanced core/lumbar strengthening with no complaints of any pain. Patient's lifting technique assessed today with 20# box. As reps progressed patient reported "twinge" of pain in low back upon lifting from floor to table.    Rehab Potential  Good    Clinical Impairments Affecting Rehab Potential  NO E-stim or [place]    PT Frequency  2x / week    PT Duration  6 weeks    PT Treatment/Interventions  Cryotherapy;Moist Heat;Functional mobility training;Therapeutic activities;Stair training;Gait training;Therapeutic exercise;Balance training;Patient/family education;Manual techniques;Passive range of motion;Taping    PT Next Visit Plan  Cont with plan of care; Nustep, lumbar stretching, core strengthening,     PT Home Exercise Plan  hamstring stretches, PPT, bridges, posture correction in standing; Piriformis stretch, LTR, march, SLR    Consulted and Agree with Plan of Care  Patient       Patient will benefit from skilled therapeutic intervention in order to improve the following deficits and impairments:  Pain, Postural dysfunction, Impaired flexibility, Decreased range of motion, Difficulty walking, Decreased activity tolerance  Visit Diagnosis: Chronic bilateral low back pain without sciatica  Muscle weakness (generalized)     Problem List Patient Active Problem List   Diagnosis Date Noted  . Spinal stenosis in cervical region 04/12/2018  . Weakness 04/11/2018  . Elevated LFTs 09/16/2017  . Routine general medical examination at a health care facility 08/29/2015  . Hyperlipidemia with target LDL less than 100 12/21/2013  . Other abnormal glucose 12/21/2013  . Upper airway cough syndrome 07/25/2013  . History of prostate cancer   . Essential hypertension 05/20/2010  . Allergic rhinitis 05/20/2010  . Asthma, mild intermittent  05/20/2010    Standley Brooking, PTA 05/02/2018, 9:55 AM  [facility] [address] [place], [place], [address] Phone: [phone]   Fax:  [phone]  Name: [name] MRN: [id number] Date of Birth: 12-04-1955

## 2018-05-05 ENCOUNTER — Encounter: Payer: Self-pay | Admitting: Physical Therapy

## 2018-05-05 ENCOUNTER — Ambulatory Visit: Payer: BLUE CROSS/BLUE SHIELD | Admitting: Physical Therapy

## 2018-05-05 DIAGNOSIS — M545 Low back pain: Secondary | ICD-10-CM | POA: Diagnosis not present

## 2018-05-05 DIAGNOSIS — M6281 Muscle weakness (generalized): Secondary | ICD-10-CM

## 2018-05-05 DIAGNOSIS — G8929 Other chronic pain: Secondary | ICD-10-CM

## 2018-05-05 NOTE — Therapy (Signed)
[facility] [place], [place], [address] Phone: [phone]   Fax:  [phone]  Physical Therapy Treatment  Patient Details  Name: [name] Holder MRN: [id number] Date of Birth: 12-Jan-1956 Referring Provider (PT): [name] Bob MD   Encounter Date: 05/05/2018  PT End of Session - 05/05/18 0953    Visit Number  8    Number of Visits  13    Date for PT Re-Evaluation  05/14/18    PT Start Time  0947    PT Stop Time  1031    PT Time Calculation (min)  44 min    Activity Tolerance  Patient tolerated treatment well    Behavior During Therapy  [facility] for tasks assessed/performed       Past Medical History:  Diagnosis Date  . Allergy   . Asthma   . Elevated PSA 10/03/2012   23.37  . History of kidney stones   . History of kidney stones   . Hx of colonic polyps   . Hypertension   . Osteopenia   . Prostate cancer [facility])     Past Surgical History:  Procedure Laterality Date  . COLONOSCOPY    . HERNIA REPAIR  ?2008  . LYMPHADENECTOMY Left 01/05/2013   Procedure: ROBOTIC LYMPHADENECTOMY;  Surgeon: [name] Gray, MD;  Location: [facility];  Service: Urology;  Laterality: Left;  . PROSTATE BIOPSY     11/12 positive biopsies  . ROTATOR CUFF REPAIR  2012    There were no vitals filed for this visit.  Subjective Assessment - 05/05/18 0951    Subjective  Patient reported back pain is different than usual. Reports certain positions cause a "twinge" in the mid back that is sharp and brief.     Pertinent History  Prostate CA, pt currently undergoing treatment.  HTN, asthma (has inhaler),     Limitations  Lifting    Currently in Pain?  Yes   intermittent        [facility] PT Assessment - 05/05/18 0001      Assessment   Medical Diagnosis  low back pain                   [facility] Adult PT Treatment/Exercise - 05/05/18 0001      Exercises   Exercises  Lumbar      Lumbar Exercises: Aerobic   Nustep  L4 x12 min      Lumbar Exercises: Machines for Strengthening   Cybex Lumbar Extension  70# 2x10 reps      Lumbar Exercises: Standing   Wall Slides  20 reps;2 seconds    Row  Strengthening;Both;20 reps    Row Limitations  Pink XTS    Shoulder Extension  Strengthening;Both;20 reps    Shoulder Extension Limitations  Pink XTS    Other Standing Lumbar Exercises  B chop wood pink XTS x20 reps    Other Standing Lumbar Exercises  lateral stepping x2 minutes with yellow TB      Lumbar Exercises: Supine   Bent Knee Raise  20 reps;3 seconds    Bridge with Ball Squeeze  20 reps;3 seconds      Lumbar Exercises: Sidelying   Clam  Both;15 reps                  PT Long Term Goals - 04/25/18 0932      PT LONG TERM GOAL #1   Title  Pt will be indepedent in his HEP and progression.  Time  6    Period  Weeks    Status  Achieved      PT LONG TERM GOAL #2   Title  Pt will be able to improve his hamstring flexibiltiy to >/= 70 degrees bilaterally in order to improve functional mobility.     Time  6    Period  Weeks    Status  On-going      PT LONG TERM GOAL #3   Title  pt will be able to demonstrate correct lifting techniques for 20 lb object.     Time  6    Period  Weeks    Status  On-going      PT LONG TERM GOAL #4   Title  pt will report pain </= 2/10 with walking 1 mile.     Baseline  pain 6/10 when walking his dogs in the evening.     Time  6    Period  Weeks    Status  On-going   3-4/10 "on a bad day" 04/25/2018           Plan - 05/05/18 1024    Clinical Impression Statement  Patient was able to tolerate treatment well today. TA of box lifting from floor to table was held as patient noted with new mid back pain. Patient denied any pain or "twinge" throughout exercises today. Revisit lifting mechanics next visit.    Clinical Presentation  Stable    Clinical Decision Making  Moderate    Rehab Potential  Good    Clinical Impairments Affecting Rehab Potential  NO E-stim or [place]     PT Frequency  2x / week    PT Duration  6 weeks    PT Treatment/Interventions  Cryotherapy;Moist Heat;Functional mobility training;Therapeutic activities;Stair training;Gait training;Therapeutic exercise;Balance training;Patient/family education;Manual techniques;Passive range of motion;Taping    PT Next Visit Plan  Cont with plan of care; Nustep, lumbar stretching, core strengthening,     Consulted and Agree with Plan of Care  Patient       Patient will benefit from skilled therapeutic intervention in order to improve the following deficits and impairments:  Pain, Postural dysfunction, Impaired flexibility, Decreased range of motion, Difficulty walking, Decreased activity tolerance  Visit Diagnosis: Chronic bilateral low back pain without sciatica  Muscle weakness (generalized)     Problem List Patient Active Problem List   Diagnosis Date Noted  . Spinal stenosis in cervical region 04/12/2018  . Weakness 04/11/2018  . Elevated LFTs 09/16/2017  . Routine general medical examination at a health care facility 08/29/2015  . Hyperlipidemia with target LDL less than 100 12/21/2013  . Other abnormal glucose 12/21/2013  . Upper airway cough syndrome 07/25/2013  . History of prostate cancer   . Essential hypertension 05/20/2010  . Allergic rhinitis 05/20/2010  . Asthma, mild intermittent 05/20/2010    Gabriela Eves 05/05/2018, 10:36 AM  [facility] [address] [place], [place], [address] Phone: [phone]   Fax:  [phone]  Name: [name] Holder MRN: [id number] Date of Birth: Nov 28, 1955

## 2018-05-09 ENCOUNTER — Encounter: Payer: Self-pay | Admitting: Physical Therapy

## 2018-05-09 ENCOUNTER — Ambulatory Visit: Payer: BLUE CROSS/BLUE SHIELD | Admitting: Physical Therapy

## 2018-05-09 DIAGNOSIS — G8929 Other chronic pain: Secondary | ICD-10-CM

## 2018-05-09 DIAGNOSIS — M6281 Muscle weakness (generalized): Secondary | ICD-10-CM

## 2018-05-09 DIAGNOSIS — M545 Low back pain: Principal | ICD-10-CM

## 2018-05-09 NOTE — Therapy (Signed)
[facility] [place], [place], [address] Phone: [phone]   Fax:  [phone]  Physical Therapy Treatment  Patient Details  Name: [name] Holder MRN: [id number] Date of Birth: 1955/10/18 Referring Provider (PT): Phylliss Bob MD   Encounter Date: 05/09/2018  PT End of Session - 05/09/18 1312    Visit Number  9    Number of Visits  13    Date for PT Re-Evaluation  05/14/18    PT Start Time  1301    PT Stop Time  1351    PT Time Calculation (min)  50 min    Activity Tolerance  Patient limited by pain;Patient tolerated treatment well    Behavior During Therapy  [facility] for tasks assessed/performed       Past Medical History:  Diagnosis Date  . Allergy   . Asthma   . Elevated PSA 10/03/2012   23.37  . History of kidney stones   . History of kidney stones   . Hx of colonic polyps   . Hypertension   . Osteopenia   . Prostate cancer [facility])     Past Surgical History:  Procedure Laterality Date  . COLONOSCOPY    . HERNIA REPAIR  ?2008  . LYMPHADENECTOMY Left 01/05/2013   Procedure: ROBOTIC LYMPHADENECTOMY;  Surgeon: Dutch Gray, MD;  Location: [facility];  Service: Urology;  Laterality: Left;  . PROSTATE BIOPSY     11/12 positive biopsies  . ROTATOR CUFF REPAIR  2012    There were no vitals filed for this visit.  Subjective Assessment - 05/09/18 1309    Subjective  Reports that his back is bothering him some today but has not been lifting.    Pertinent History  Prostate CA, pt currently undergoing treatment.  HTN, asthma (has inhaler),     Limitations  Lifting    Currently in Pain?  Yes    Pain Score  3     Pain Location  Back    Pain Orientation  Lower    Pain Descriptors / Indicators  Discomfort    Pain Type  Chronic pain    Pain Onset  More than a month ago         [facility] PT Assessment - 05/09/18 0001      Assessment   Medical Diagnosis  low back pain    Hand Dominance  Right    Prior Therapy  no       Precautions   Precautions  Other (comment)    Precaution Comments  NO [place] OR E-STIM Pt is currently undergoing treatments for metastic prostate CA      Restrictions   Weight Bearing Restrictions  No                   [facility] Adult PT Treatment/Exercise - 05/09/18 0001      Exercises   Exercises  Lumbar      Lumbar Exercises: Stretches   Passive Hamstring Stretch  Right;Left;3 reps;30 seconds    Single Knee to Chest Stretch  Left;3 reps;30 seconds    Lower Trunk Rotation  5 reps;Other (comment)   15 sec holds   Hip Flexor Stretch  Right;3 reps;30 seconds    Standing Side Bend  Left;3 reps;30 seconds      Lumbar Exercises: Aerobic   Nustep  L5 x12 min      Lumbar Exercises: Standing   Row  Strengthening;Both;20 reps    Row Limitations  Pink XTS  Shoulder Extension  Strengthening;Both;20 reps    Shoulder Extension Limitations  Pink XTS      Lumbar Exercises: Supine   Bent Knee Raise  15 reps;5 seconds    Bridge with [employer]  Other (comment)   unable secondary to pain 4-5/10     Lumbar Exercises: Sidelying   Clam  Both;20 reps;3 seconds                  PT Long Term Goals - 04/25/18 0932      PT LONG TERM GOAL #1   Title  Pt will be indepedent in his HEP and progression.     Time  6    Period  Weeks    Status  Achieved      PT LONG TERM GOAL #2   Title  Pt will be able to improve his hamstring flexibiltiy to >/= 70 degrees bilaterally in order to improve functional mobility.     Time  6    Period  Weeks    Status  On-going      PT LONG TERM GOAL #3   Title  pt will be able to demonstrate correct lifting techniques for 20 lb object.     Time  6    Period  Weeks    Status  On-going      PT LONG TERM GOAL #4   Title  pt will report pain </= 2/10 with walking 1 mile.     Baseline  pain 6/10 when walking his dogs in the evening.     Time  6    Period  Weeks    Status  On-going   3-4/10 "on a bad day" 04/25/2018            Plan - 05/09/18 1355    Clinical Impression Statement  Patient presented in clinic with reports of L low back pain. Patient able to complete standing exercises but in more R standing lean with R shoulder lean. More lumbar and hip stretching completed today to remove L low back pain. Patient unable to complete bridging today secondary to pain. QL stretch completed secondary to pain reports and patient instructed to sleep with folded pillow between his knees as he is a sidesleeper to correct any pelvic alignment. Patient rated 3/10 pain following end of treatment.    Rehab Potential  Good    Clinical Impairments Affecting Rehab Potential  NO E-stim or [place]    PT Frequency  2x / week    PT Duration  6 weeks    PT Treatment/Interventions  Cryotherapy;Moist Heat;Functional mobility training;Therapeutic activities;Stair training;Gait training;Therapeutic exercise;Balance training;Patient/family education;Manual techniques;Passive range of motion;Taping    PT Next Visit Plan  Assess goals; Cont with plan of care; Nustep, lumbar stretching, core strengthening,     PT Home Exercise Plan  hamstring stretches, PPT, bridges, posture correction in standing; Piriformis stretch, LTR, march, SLR    Consulted and Agree with Plan of Care  Patient       Patient will benefit from skilled therapeutic intervention in order to improve the following deficits and impairments:  Pain, Postural dysfunction, Impaired flexibility, Decreased range of motion, Difficulty walking, Decreased activity tolerance  Visit Diagnosis: Chronic bilateral low back pain without sciatica  Muscle weakness (generalized)     Problem List Patient Active Problem List   Diagnosis Date Noted  . Spinal stenosis in cervical region 04/12/2018  . Weakness 04/11/2018  . Elevated LFTs 09/16/2017  . Routine general medical examination  at a health care facility 08/29/2015  . Hyperlipidemia with target LDL less than 100 12/21/2013  .  Other abnormal glucose 12/21/2013  . Upper airway cough syndrome 07/25/2013  . History of prostate cancer   . Essential hypertension 05/20/2010  . Allergic rhinitis 05/20/2010  . Asthma, mild intermittent 05/20/2010    Standley Brooking, PTA 05/09/2018, 2:04 PM  [facility] [address] [place], [place], [address] Phone: [phone]   Fax:  [phone]  Name: AMEDIO BOWLBY MRN: [id number] Date of Birth: Apr 22, 1956

## 2018-05-11 ENCOUNTER — Other Ambulatory Visit: Payer: Self-pay | Admitting: Internal Medicine

## 2018-05-11 DIAGNOSIS — I10 Essential (primary) hypertension: Secondary | ICD-10-CM

## 2018-05-12 ENCOUNTER — Encounter: Payer: BLUE CROSS/BLUE SHIELD | Admitting: *Deleted

## 2018-05-16 ENCOUNTER — Ambulatory Visit: Payer: BLUE CROSS/BLUE SHIELD | Admitting: Physical Therapy

## 2018-05-16 ENCOUNTER — Encounter: Payer: Self-pay | Admitting: Physical Therapy

## 2018-05-16 DIAGNOSIS — G8929 Other chronic pain: Secondary | ICD-10-CM

## 2018-05-16 DIAGNOSIS — M545 Low back pain: Secondary | ICD-10-CM | POA: Diagnosis not present

## 2018-05-16 DIAGNOSIS — M6281 Muscle weakness (generalized): Secondary | ICD-10-CM

## 2018-05-16 NOTE — Therapy (Addendum)
[facility] [place], [place], [address] Phone: [phone]   Fax:  [phone]  Physical Therapy Treatment  Patient Details  Name: MAJID MCCRAVY MRN: [id number] Date of Birth: July 27, 1956 Referring Provider (PT): Phylliss Bob MD   Encounter Date: 05/16/2018  PT End of Session - 05/16/18 0903    Visit Number  10    Number of Visits  13    Date for PT Re-Evaluation  05/14/18    PT Start Time  0905    PT Stop Time  0934    PT Time Calculation (min)  29 min    Activity Tolerance  Patient limited by pain    Behavior During Therapy  [facility] for tasks assessed/performed       Past Medical History:  Diagnosis Date  . Allergy   . Asthma   . Elevated PSA 10/03/2012   23.37  . History of kidney stones   . History of kidney stones   . Hx of colonic polyps   . Hypertension   . Osteopenia   . Prostate cancer [facility])     Past Surgical History:  Procedure Laterality Date  . COLONOSCOPY    . HERNIA REPAIR  ?2008  . LYMPHADENECTOMY Left 01/05/2013   Procedure: ROBOTIC LYMPHADENECTOMY;  Surgeon: Dutch Gray, MD;  Location: [facility];  Service: Urology;  Laterality: Left;  . PROSTATE BIOPSY     11/12 positive biopsies  . ROTATOR CUFF REPAIR  2012    There were no vitals filed for this visit.  Subjective Assessment - 05/16/18 0903    Subjective  Reports muscle spasms and no real relief. Has been using TENS unit at home and heat as well. Reports spasms mostly at night or lying on his back but pain is "off the charts."    Pertinent History  Prostate CA, pt currently undergoing treatment.  HTN, asthma (has inhaler),     Limitations  Lifting    Currently in Pain?  Yes    Pain Score  4     Pain Location  Back    Pain Orientation  Left;Lower    Pain Descriptors / Indicators  Spasm;Discomfort    Pain Type  Chronic pain    Pain Onset  More than a month ago         [facility] PT Assessment - 05/16/18 0001      Assessment   Medical  Diagnosis  low back pain    Referring Provider (PT)  Phylliss Bob MD    Hand Dominance  Right    Prior Therapy  no      Precautions   Precautions  Other (comment)    Precaution Comments  NO [place] OR E-STIM Pt is currently undergoing treatments for metastic prostate CA      Restrictions   Weight Bearing Restrictions  No                   [facility] Adult PT Treatment/Exercise - 05/16/18 0001      Modalities   Modalities  Moist Heat      Moist Heat Therapy   Number Minutes Moist Heat  10 Minutes    Moist Heat Location  Lumbar Spine      Manual Therapy   Manual Therapy  Soft tissue mobilization    Soft tissue mobilization  STW to L QL and lumbar paraspinals in R SL to reduce tightness and pain  PT Long Term Goals - 04/25/18 0932      PT LONG TERM GOAL #1   Title  Pt will be indepedent in his HEP and progression.     Time  6    Period  Weeks    Status  Achieved      PT LONG TERM GOAL #2   Title  Pt will be able to improve his hamstring flexibiltiy to >/= 70 degrees bilaterally in order to improve functional mobility.     Time  6    Period  Weeks    Status  On-going      PT LONG TERM GOAL #3   Title  pt will be able to demonstrate correct lifting techniques for 20 lb object.     Time  6    Period  Weeks    Status  On-going      PT LONG TERM GOAL #4   Title  pt will report pain </= 2/10 with walking 1 mile.     Baseline  pain 6/10 when walking his dogs in the evening.     Time  6    Period  Weeks    Status  On-going   3-4/10 "on a bad day" 04/25/2018           Plan - 05/16/18 0952    Clinical Impression Statement  Patient presented in clinic with reports of increased L low back pain and muscle spasms without any relief. Palpable TPs and muscle tightness in L lumbar paraspinals and especially along medial fibers of QL. Patient initially not tender with manual therapy but as manual therapy session progressed tenderness started.  Patient very guided and painful with transition movements and unable to bridge properly for moist heat placement. Muscle spasms began as patient was lying supine on moist heat. Overall treatment shorted due to such increased pain and limited POC due to prostate cancer. Patient instructed to contact his referring MD to assess progression or other options. Patient reported 8/10 pain upon end of session.    Rehab Potential  Good    Clinical Impairments Affecting Rehab Potential  NO E-stim or [place]    PT Frequency  2x / week    PT Duration  6 weeks    PT Treatment/Interventions  Cryotherapy;Moist Heat;Functional mobility training;Therapeutic activities;Stair training;Gait training;Therapeutic exercise;Balance training;Patient/family education;Manual techniques;Passive range of motion;Taping    PT Next Visit Plan  Assess goals; Cont with plan of care; Nustep, lumbar stretching, core strengthening,     PT Home Exercise Plan  hamstring stretches, PPT, bridges, posture correction in standing; Piriformis stretch, LTR, march, SLR    Consulted and Agree with Plan of Care  Patient       Patient will benefit from skilled therapeutic intervention in order to improve the following deficits and impairments:  Pain, Postural dysfunction, Impaired flexibility, Decreased range of motion, Difficulty walking, Decreased activity tolerance  Visit Diagnosis: Chronic bilateral low back pain without sciatica  Muscle weakness (generalized)    PHYSICAL THERAPY DISCHARGE SUMMARY  Visits from Start of Care: 10  Current functional level related to goals / functional outcomes: See above   Remaining deficits: See above   Education / Equipment: HEP Plan: Patient agrees to discharge.  Patient goals were not met. Patient is being discharged due to not returning since the last visit.  ?????       Problem List Patient Active Problem List   Diagnosis Date Noted  . Spinal stenosis in cervical region 04/12/2018  .  Weakness 04/11/2018  . Elevated LFTs 09/16/2017  . Routine general medical examination at a health care facility 08/29/2015  . Hyperlipidemia with target LDL less than 100 12/21/2013  . Other abnormal glucose 12/21/2013  . Upper airway cough syndrome 07/25/2013  . History of prostate cancer   . Essential hypertension 05/20/2010  . Allergic rhinitis 05/20/2010  . Asthma, mild intermittent 05/20/2010    Standley Brooking, PTA 05/16/2018, 9:59 AM Kearney Hard, PT 05/09/20 1:06 PM   [facility] [address] [place], [place], [address] Phone: [phone]   Fax:  [phone]  Name: SUE MCALEXANDER MRN: [id number] Date of Birth: Dec 21, 1955

## 2018-05-23 ENCOUNTER — Encounter: Payer: BLUE CROSS/BLUE SHIELD | Admitting: Physical Therapy

## 2018-06-03 ENCOUNTER — Other Ambulatory Visit: Payer: Self-pay | Admitting: Internal Medicine

## 2018-06-03 DIAGNOSIS — J4520 Mild intermittent asthma, uncomplicated: Secondary | ICD-10-CM

## 2018-06-09 ENCOUNTER — Other Ambulatory Visit: Payer: Self-pay | Admitting: Internal Medicine

## 2018-06-09 DIAGNOSIS — I10 Essential (primary) hypertension: Secondary | ICD-10-CM

## 2018-06-28 ENCOUNTER — Other Ambulatory Visit (HOSPITAL_COMMUNITY): Payer: Self-pay | Admitting: Physical Medicine and Rehabilitation

## 2018-06-28 DIAGNOSIS — M4836 Traumatic spondylopathy, lumbar region: Secondary | ICD-10-CM

## 2018-06-28 DIAGNOSIS — C61 Malignant neoplasm of prostate: Secondary | ICD-10-CM

## 2018-07-06 ENCOUNTER — Encounter (HOSPITAL_COMMUNITY)
Admission: RE | Admit: 2018-07-06 | Discharge: 2018-07-06 | Disposition: A | Discharge status: Home / Self Care | Payer: BLUE CROSS/BLUE SHIELD | Source: Ambulatory Visit | Attending: Physical Medicine and Rehabilitation | Admitting: Physical Medicine and Rehabilitation

## 2018-07-06 DIAGNOSIS — C61 Malignant neoplasm of prostate: Secondary | ICD-10-CM | POA: Diagnosis not present

## 2018-07-06 DIAGNOSIS — M4836 Traumatic spondylopathy, lumbar region: Secondary | ICD-10-CM | POA: Diagnosis present

## 2018-07-06 MED ORDER — TECHNETIUM TC 99M MEDRONATE IV KIT
19.4000 | PACK | Freq: Once | INTRAVENOUS | Status: AC | PRN
  Administered 2018-07-06: 19.4 via INTRAVENOUS

## 2018-07-08 ENCOUNTER — Other Ambulatory Visit (HOSPITAL_COMMUNITY): Payer: Self-pay | Admitting: Physical Medicine and Rehabilitation

## 2018-07-08 DIAGNOSIS — M546 Pain in thoracic spine: Secondary | ICD-10-CM

## 2018-07-08 DIAGNOSIS — M545 Low back pain, unspecified: Secondary | ICD-10-CM

## 2018-07-18 ENCOUNTER — Ambulatory Visit (HOSPITAL_COMMUNITY)
Admission: RE | Admit: 2018-07-18 | Discharge: 2018-07-18 | Disposition: A | Discharge status: Home / Self Care | Payer: BLUE CROSS/BLUE SHIELD | Source: Ambulatory Visit | Attending: Physical Medicine and Rehabilitation | Admitting: Physical Medicine and Rehabilitation

## 2018-07-18 DIAGNOSIS — M48061 Spinal stenosis, lumbar region without neurogenic claudication: Secondary | ICD-10-CM | POA: Insufficient documentation

## 2018-07-18 DIAGNOSIS — M8448XA Pathological fracture, other site, initial encounter for fracture: Secondary | ICD-10-CM | POA: Insufficient documentation

## 2018-07-18 DIAGNOSIS — M545 Low back pain, unspecified: Secondary | ICD-10-CM

## 2018-07-18 DIAGNOSIS — R29890 Loss of height: Secondary | ICD-10-CM | POA: Diagnosis not present

## 2018-07-18 DIAGNOSIS — M546 Pain in thoracic spine: Secondary | ICD-10-CM

## 2018-07-18 DIAGNOSIS — M899 Disorder of bone, unspecified: Secondary | ICD-10-CM | POA: Diagnosis not present

## 2018-07-18 DIAGNOSIS — M5136 Other intervertebral disc degeneration, lumbar region: Secondary | ICD-10-CM | POA: Insufficient documentation

## 2018-07-18 DIAGNOSIS — M4804 Spinal stenosis, thoracic region: Secondary | ICD-10-CM | POA: Diagnosis not present

## 2018-07-18 MED ORDER — GADOBUTROL 1 MMOL/ML IV SOLN
10.0000 mL | Freq: Once | INTRAVENOUS | Status: AC | PRN
  Administered 2018-07-18: 10 mL via INTRAVENOUS

## 2018-07-21 LAB — POCT I-STAT CREATININE: Creatinine, Ser: 0.7 mg/dL (ref 0.61–1.24)

## 2018-07-25 ENCOUNTER — Encounter: Payer: Self-pay | Admitting: Oncology

## 2018-07-25 ENCOUNTER — Telehealth: Payer: Self-pay | Admitting: Oncology

## 2018-07-25 NOTE — Telephone Encounter (Signed)
Received an oncology referral from Dr. Anselm Lis for castrate resistant metastatic prostate cancer. Pt has been cld and scheduled to see Dr. Alen Blew on 08/05/18 at 2pm. Pt and his wife are aware to arrive 30 minutes early. Letter mailed.

## 2018-08-01 ENCOUNTER — Encounter: Payer: Self-pay | Admitting: Radiation Oncology

## 2018-08-01 NOTE — Progress Notes (Signed)
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  Pain on a scale of 0-10 is:  Lower back pain that is positional. Taking oxycodone-acetaminophen, motrin, robaxin, prednisone, and doxepin to manage. Presently reports pain 4 on a scale of 0-10. Requesting refill of oxycodone/acetaminophen or enough to get him to his consult with [facility] on Friday.    If Spine Met(s), symptoms, if any, include:  Bowel/Bladder retention or incontinence (please describe): No. Reports constipation related to pain medications.   Numbness or weakness in extremities (please describe): significant weakness in bilateral lower extremities preceding a fall in September 2018 but none since  Current Decadron regimen, if applicable: Prednisone 5 mg bid  Ambulatory status? Walker? Wheelchair?: Ambulatory  SAFETY ISSUES:  Prior radiation? no  Pacemaker/ICD? no  Possible current pregnancy? no, male patient  Is the patient on methotrexate? no  Current Complaints / other details:  63 year old male. Married. Reports an unintentional weight loss of 18 lb in four months related to pain.   Received Lupron 30 on 05/04/18. Next Lupron injection scheduled for 09/13/17.   04/27/18 PSA  0.52 03/02/18  PSA  0.73 12/17/17 PSA  0.97 10/12/17 PSA  1.57 08/10/17 PSA  3.83 03/31/17  PSA  11.3 11/19/16 PSA  6.51 07/08/16 PSA  5.01

## 2018-08-02 ENCOUNTER — Other Ambulatory Visit: Payer: Self-pay | Admitting: Urology

## 2018-08-02 ENCOUNTER — Ambulatory Visit
Admission: RE | Admit: 2018-08-02 | Discharge: 2018-08-02 | Disposition: A | Discharge status: Home / Self Care | Payer: BLUE CROSS/BLUE SHIELD | Source: Ambulatory Visit | Attending: Radiation Oncology | Admitting: Radiation Oncology

## 2018-08-02 ENCOUNTER — Encounter: Payer: Self-pay | Admitting: Radiation Oncology

## 2018-08-02 ENCOUNTER — Other Ambulatory Visit: Payer: Self-pay

## 2018-08-02 VITALS — BP 164/101 | HR 91 | Temp 98.6°F | Resp 91 | Ht 68.0 in | Wt 198.4 lb

## 2018-08-02 DIAGNOSIS — Z87442 Personal history of urinary calculi: Secondary | ICD-10-CM | POA: Diagnosis not present

## 2018-08-02 DIAGNOSIS — Z86010 Personal history of colonic polyps: Secondary | ICD-10-CM | POA: Insufficient documentation

## 2018-08-02 DIAGNOSIS — M48061 Spinal stenosis, lumbar region without neurogenic claudication: Secondary | ICD-10-CM | POA: Insufficient documentation

## 2018-08-02 DIAGNOSIS — I10 Essential (primary) hypertension: Secondary | ICD-10-CM | POA: Insufficient documentation

## 2018-08-02 DIAGNOSIS — C7951 Secondary malignant neoplasm of bone: Secondary | ICD-10-CM

## 2018-08-02 DIAGNOSIS — Z800 Family history of malignant neoplasm of digestive organs: Secondary | ICD-10-CM | POA: Insufficient documentation

## 2018-08-02 DIAGNOSIS — R634 Abnormal weight loss: Secondary | ICD-10-CM | POA: Insufficient documentation

## 2018-08-02 DIAGNOSIS — J45909 Unspecified asthma, uncomplicated: Secondary | ICD-10-CM | POA: Diagnosis not present

## 2018-08-02 DIAGNOSIS — M8580 Other specified disorders of bone density and structure, unspecified site: Secondary | ICD-10-CM | POA: Insufficient documentation

## 2018-08-02 DIAGNOSIS — M5136 Other intervertebral disc degeneration, lumbar region: Secondary | ICD-10-CM | POA: Insufficient documentation

## 2018-08-02 DIAGNOSIS — Z87891 Personal history of nicotine dependence: Secondary | ICD-10-CM | POA: Diagnosis not present

## 2018-08-02 DIAGNOSIS — Z192 Hormone resistant malignancy status: Secondary | ICD-10-CM | POA: Diagnosis not present

## 2018-08-02 DIAGNOSIS — Z8546 Personal history of malignant neoplasm of prostate: Secondary | ICD-10-CM | POA: Diagnosis not present

## 2018-08-02 DIAGNOSIS — Z79899 Other long term (current) drug therapy: Secondary | ICD-10-CM | POA: Diagnosis not present

## 2018-08-02 DIAGNOSIS — C61 Malignant neoplasm of prostate: Secondary | ICD-10-CM | POA: Diagnosis not present

## 2018-08-02 DIAGNOSIS — R531 Weakness: Secondary | ICD-10-CM | POA: Diagnosis not present

## 2018-08-02 DIAGNOSIS — C7952 Secondary malignant neoplasm of bone marrow: Secondary | ICD-10-CM

## 2018-08-02 MED ORDER — OXYCODONE HCL 5 MG PO TABS: 5.0000 mg | ORAL_TABLET | ORAL | 0 refills | Status: DC | PRN

## 2018-08-02 NOTE — Progress Notes (Signed)
See progress note under physician encounter. 

## 2018-08-02 NOTE — Progress Notes (Signed)
Radiation Oncology         ([phone]) [phone] ________________________________  Initial Outpatient Consultation  Name: [name] Holder MRN: [id number]  Date of Service: 08/02/2018 DOB: 1955-12-15  OY:DXAJO, [name] Right, MD  [name] Bring, MD   REFERRING PHYSICIAN: Raynelle Bring, MD  DIAGNOSIS: 63 y.o. male with [place] with new onset severe progressive low back pain and recent MRI imaging suggestive of pathologic fractures but recent CT imaging without correlate.    ICD-10-CM   1. Secondary malignant neoplasm of bone and bone marrow (HCC) C79.51    C79.52     HISTORY OF PRESENT ILLNESS: [name] Holder is a 63 y.o. male seen at the request of Dr. Alinda Holder. He was initially diagnosed with locally advanced prostate cancer in April 2014 and was found to have concerning pelvic lymphadenopathy on his staging evaluation. His pretreatment PSA was 23.4. Percutaneous biopsy of an enlarged left external iliac lymph node was benign, and he elected to proceed with primary surgical therapy. He underwent left pelvic lymphadenectomy on 01/05/2013, and intraoperative pathology confirmed metastatic adenocarcinoma with extracapsular extension. He therefore did not undergo prostatectomy but instead was started on androgen deprivation therapy with Lupron in June 2014.   He initially had a good response to ADT but developed castrate resistant prostate cancer with PSA up to 11.3 in December 2018.  He was started on Zytiga and prednisone along with androgen deprivation at that time. Since starting this treatment, his PSA has steadily declined from 11.3 to 0.52 as of October 2019. His most recent PSA was 0.84 on 07/19/2018.  He had CT C/A/P for disease restaging in September 2018 which showed a mildly enlarging aortocaval lymph node measuring approximately 1.5 cm but no other worrisome lymphadenopathy or osseous metastatic disease was noted.  He suffered a fall at home following sudden onset of lower extremity weakness  requiring evaluation through the emergency department on 04/11/2018. Subsequent brain and spinal imaging at the time of admission showed no significant abnormality or evidence of metastatic disease or acute CVA.  Spine imaging did show known degenerative disc disease, foraminal stenosis and a pars defect at L5.  His lower back pain progressively worsened and therefore he was referred to orthopedics for pain management.  He has been seeing Dr. Lynann Holder and has received two epidural steroid injections with little to no relief. His pain was severe and debilitating until he started on Percocet in December which has provided significant relief.   Due to progression of his back pain, he underwent a bone scan on 07/06/2018 which identified new abnormal radiotracer activity in the posterior left 9th rib and the T11, L1, and L3 vertebral bodies, highly suspicious for metastatic disease given the negative MRI findings in September. Repeat thoracic and lumbar spine MRI was performed on 07/18/2018 for further evaluation and showed multiple new bone marrow lesions of the thoracic and lumbar spine, consistent with osseous metastases with associated pathologic fractures at T10 and T12, with 50-60% height loss. At L2, there was a superior endplate pathologic fracture with less than 25% height loss.   He had a repeat CT C/A/P on 07/28/2018 for disease restaging and this revealed interval resolution of the aortocaval lymphadenopathy and no definite metastatic disease in the chest abdomen or pelvis.  Compression fractures at T10, T12, L1 and L2 were without definite pathologic features and favored to be benign fractures, especially in light of his recent PSA results which remain low.  There was evidence of a possible small bladder tumor on the  left which was new as compared to his previous imaging from September 2018.  The patient reviewed the imaging results with Dr. Alinda Holder and has kindly been referred today for discussion of  potential palliative radiation treatment options. He is accompanied by his wife.   PREVIOUS RADIATION THERAPY: No  PAST MEDICAL HISTORY:  Past Medical History:  Diagnosis Date  . Allergy   . Asthma   . Elevated PSA 10/03/2012   23.37  . History of kidney stones   . History of kidney stones   . Hx of colonic polyps   . Hypertension   . Osteopenia   . Prostate cancer ([name] Holder)       PAST SURGICAL HISTORY: Past Surgical History:  Procedure Laterality Date  . COLONOSCOPY    . HERNIA REPAIR  ?2008  . LYMPHADENECTOMY Left 01/05/2013   Procedure: ROBOTIC LYMPHADENECTOMY;  Surgeon: [name] Gray, MD;  Location: [facility];  Service: Urology;  Laterality: Left;  . PROSTATE BIOPSY     11/12 positive biopsies  . ROTATOR CUFF REPAIR  2012    FAMILY HISTORY:  Family History  Problem Relation Age of Onset  . Arthritis Mother   . Diabetes Mother   . Hypertension Mother   . Hyperlipidemia Mother   . Hypertension Father   . Prostate cancer Father        radiation in mid 34s  . Heart attack Father   . Cancer Sister        precancerous colon polyps, 6 in colon removed  . Stroke Maternal Grandfather     SOCIAL HISTORY:  Social History   Socioeconomic History  . Marital status: Married    Spouse name: Not on file  . Number of children: Not on file  . Years of education: 12  . Highest education level: Not on file  Occupational History  . 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 Financial resource strain: Not on file  . Food insecurity:    Worry: Not on file    Inability: Not on file  . Transportation needs:    Medical: Not on file    Non-medical: Not on file  Tobacco Use  . Smoking status: Former Smoker    Packs/day: 0.50    Years: 15.00    Pack years: 7.50    Types: Cigarettes  . Smokeless tobacco: Never Used  Substance and Sexual Activity  . Alcohol use: Yes    Alcohol/week: 2.0 standard drinks    Types: 2 Glasses of  wine per week    Comment: wine  1  glasses daily  . Drug use: No  . Sexual activity: Not Currently  Lifestyle  . Physical activity:    Days per week: Not on file    Minutes per session: Not on file  . Stress: Not on file  Relationships  . Social connections:    Talks on phone: Not on file    Gets together: Not on file    Attends religious service: Not on file    Active member of club or organization: Not on file    Attends meetings of clubs or organizations: Not on file    Relationship status: Not on file  . Intimate partner violence:    Fear of current or ex partner: Not on file    Emotionally abused: Not on file    Physically abused: Not on file    Forced sexual activity: Not on file  Other Topics Concern  . Not on file  Social History Narrative   Regular exercise-yes   Caffeine Use-yes    ALLERGIES: Amlodipine and Tetracycline  MEDICATIONS:  Current Outpatient Medications  Medication Sig Dispense Refill  . abiraterone acetate (ZYTIGA) 250 MG tablet Take 1,000 mg by mouth daily.   10  . albuterol (PROAIR HFA) 108 (90 Base) MCG/ACT inhaler Inhale 2 puffs into the lungs every 6 (six) hours as needed for wheezing or shortness of breath.    [name] azelastine (ASTELIN) 0.1 % nasal spray Place 2 sprays into both nostrils every morning.    . budesonide-formoterol (SYMBICORT) 160-4.5 MCG/ACT inhaler Inhale 2 puffs into the lungs 2 (two) times daily.    [name] CALCIUM-VITAMIN D PO Take 1 tablet by mouth 2 (two) times daily.    . carvedilol (COREG) 3.125 MG tablet Take 1 tablet (3.125 mg total) by mouth 2 (two) times daily with a meal. 180 tablet 0  . cyanocobalamin (,VITAMIN B-12,) 1000 MCG/ML injection   4  . docusate sodium (COLACE) 100 MG capsule Take 100 mg by mouth 2 (two) times daily.    [name] DOXEPIN HCL PO Take 20 mg by mouth at bedtime as needed.    [name] ibuprofen (ADVIL,MOTRIN) 200 MG tablet Take 200 mg by mouth 2 (two) times daily.    [name] Leuprolide Acetate (LUPRON IJ) Inject 1 Dose as  directed every 4 (four) months. This is administered by [facility]    . methocarbamol (ROBAXIN) 500 MG tablet Take 500 mg by mouth at bedtime as needed for muscle spasms.    . mometasone (NASONEX) 50 MCG/ACT nasal spray USE 1 TO 2 SPRAYS IN EACH NOSTRIL ONCE DAILY. (Patient taking differently: Place 2 sprays into the nose daily. ) 17 g 5  . olmesartan (BENICAR) 40 MG tablet TAKE 1 TABLET DAILY 90 tablet 0  . oxyCODONE-acetaminophen (PERCOCET) 10-325 MG tablet Take 2 tablets by mouth every 6 (six) hours as needed for pain.    . predniSONE (DELTASONE) 5 MG tablet Take 5 mg by mouth 2 (two) times daily.   4  . SYRINGE-NEEDLE, DISP, 3 ML 3 ML MISC Use to inject b12 monthly. 12 each 0  . albuterol (PROAIR HFA) 108 (90 Base) MCG/ACT inhaler Inhale 2 puffs into the lungs every 6 (six) hours as needed for wheezing or shortness of breath. (Patient not taking: Reported on 08/02/2018) 8.5 g 3  . diclofenac sodium (VOLTAREN) 1 % GEL Apply 2 g topically daily as needed (for pain).      No current facility-administered medications for this encounter.     REVIEW OF SYSTEMS:  On review of systems, the patient reports that he is doing well overall. He denies any chest pain, shortness of breath, cough, fevers, chills, or night sweats. He reports unintentional weight loss of 18 pounds in the past 4 months related to pain, constipation and decreased appetite which he attributes to narcotic pain medications. He denies any bladder disturbances, and denies abdominal pain, nausea or vomiting. He reports constipation related to pain medications. He reports lower back pain that is worse with rising from a lying or sitting position. He rates his current pain 4/10. His pain is managed with 10-325mg  Percocet every 6 hours with significant improvement in pain but also results in decreased appetite and significant constipation. He is also taking Motrin, Robaxin, prednisone 5mg  BID, and Doxepin. He reports significant weakness in  his bilateral lower extremities preceding a fall in September 2019, but none since then.  He is ambulatory. A complete review of systems is obtained and is otherwise negative.    PHYSICAL EXAM:  Wt Readings from Last 3 Encounters:  08/02/18 198 lb 6.4 oz (90 kg)  04/10/18 215 lb (97.5 kg)  09/16/17 214 lb (97.1 kg)   Temp Readings from Last 3 Encounters:  08/02/18 98.6 F (37 C)  04/12/18 98.7 F (37.1 C) (Oral)  09/16/17 98.4 F (36.9 C) (Oral)   BP Readings from Last 3 Encounters:  08/02/18 (!) 164/101  04/12/18 (!) 147/76  09/16/17 130/90   Pulse Readings from Last 3 Encounters:  08/02/18 91  04/12/18 88  09/16/17 76   Pain Assessment Pain Score: 4  Pain Frequency: Constant Pain Loc: Back/10  In general this is a well appearing caucasian male in no acute distress. He is alert and oriented x4 and appropriate throughout the examination. HEENT reveals that the patient is normocephalic, atraumatic. EOMs are intact. PERRLA. Skin is intact without any evidence of gross lesions. Cardiovascular exam reveals a regular rate and rhythm, no clicks rubs or murmurs are auscultated. Chest is clear to auscultation bilaterally. Lymphatic assessment is performed and does not reveal any adenopathy in the cervical, supraclavicular, axillary, or inguinal chains. Abdomen has active bowel sounds in all quadrants and is intact. The abdomen is soft, non tender, non distended. Lower extremities are positive for 2+ pitting edema bilaterally.   KPS = 70  100 - Normal; no complaints; no evidence of disease. 90   - Able to carry on normal activity; minor signs or symptoms of disease. 80   - Normal activity with effort; some signs or symptoms of disease. 37   - Cares for self; unable to carry on normal activity or to do active work. 60   - Requires occasional assistance, but is able to care for most of his personal needs. 50   - Requires considerable assistance and frequent medical care. 23   -  Disabled; requires special care and assistance. 14   - Severely disabled; hospital admission is indicated although death not imminent. 53   - Very sick; hospital admission necessary; active supportive treatment necessary. 10   - Moribund; fatal processes progressing rapidly. 0     - Dead  Karnofsky DA, Abelmann [place], Craver LS and Burchenal [facility] [phone]) The use of the nitrogen mustards in the palliative treatment of carcinoma: with particular reference to bronchogenic carcinoma Cancer 1 [id number]  LABORATORY DATA:  Lab Results  Component Value Date   WBC 4.1 04/12/2018   HGB 11.3 (L) 04/12/2018   HCT 33.0 (L) 04/12/2018   MCV 100.3 (H) 04/12/2018   PLT 138 (L) 04/12/2018   Lab Results  Component Value Date   NA 136 04/12/2018   K 3.9 04/12/2018   CL 106 04/12/2018   CO2 27 04/12/2018   Lab Results  Component Value Date   ALT 20 04/10/2018   AST 21 04/10/2018   ALKPHOS 39 04/10/2018   BILITOT 0.8 04/10/2018     RADIOGRAPHY: Mr Thoracic Spine W Wo Contrast  Result Date: 07/18/2018 CLINICAL DATA:  Metastatic prostate cancer.  Mid and low back pain EXAM: MRI THORACIC AND LUMBAR SPINE WITHOUT AND WITH CONTRAST TECHNIQUE: Multiplanar and multiecho pulse sequences of the thoracic and lumbar spine were obtained without and with intravenous contrast. CONTRAST:  10 mL Gadavist COMPARISON:  Bone scan 07/06/2018 Lumbar spine MRI 04/11/2018 FINDINGS: MRI THORACIC SPINE FINDINGS Alignment:  Physiologic. Vertebrae: Abnormal marrow signal at T10 and T12 with associated pathologic fractures.  There is approximately 60% height loss at T10 and T12. There is no retropulsion. Moderate marrow edema with associated contrast enhancement. Cord:  Normal signal and morphology. Paraspinal and other soft tissues: Negative. Disc levels: There is mild spinal canal narrowing at T10 and T12 with bulging of the ventral epidural venous plexus. MRI LUMBAR SPINE FINDINGS Segmentation:  Standard. Alignment:  Physiologic.  Vertebrae: New signal abnormalities at the inferior L1 and superior L2 endplates with associated edema and contrast enhancement. There is mild superior endplate height loss at L2. Conus medullaris: Extends to the L1 level and appears normal. There is a small filum terminalis lipoma that measures up to 5 mm. Paraspinal and other soft tissues: Negative. Disc levels: L1-2: No spinal canal or neural foraminal stenosis. L2-3: Mild disc bulge without spinal canal or neural foraminal stenosis. L3-4: Disc space narrowing with endplate ridging and mild disc bulge. No spinal canal stenosis. Moderate Holder foraminal stenosis. L4-5: Disc space narrowing and endplate ridging with severe Holder foraminal stenosis. No spinal canal stenosis. L5-S1: No spinal canal or neural foraminal stenosis. IMPRESSION: 1. Multiple new bone marrow lesions of the thoracic and lumbar spine, consistent with osseous metastases. At T10 and T12, these lesions are associated with pathologic fractures with 50-60% height loss. At L2, there is a superior endplate pathologic fracture with less than 25% height loss. 2. No bony retropulsion at the affect the levels. Mild spinal canal stenosis at T10 and T12. 3. Multilevel lumbar degenerative disc disease with severe Holder L4 and moderate Holder L3 neural foraminal stenosis. Electronically Signed   By: Ulyses Jarred M.D.   On: 07/18/2018 20:39   Mr Lumbar Spine W Wo Contrast  Result Date: 07/18/2018 CLINICAL DATA:  Metastatic prostate cancer.  Mid and low back pain EXAM: MRI THORACIC AND LUMBAR SPINE WITHOUT AND WITH CONTRAST TECHNIQUE: Multiplanar and multiecho pulse sequences of the thoracic and lumbar spine were obtained without and with intravenous contrast. CONTRAST:  10 mL Gadavist COMPARISON:  Bone scan 07/06/2018 Lumbar spine MRI 04/11/2018 FINDINGS: MRI THORACIC SPINE FINDINGS Alignment:  Physiologic. Vertebrae: Abnormal marrow signal at T10 and T12 with associated pathologic fractures. There is  approximately 60% height loss at T10 and T12. There is no retropulsion. Moderate marrow edema with associated contrast enhancement. Cord:  Normal signal and morphology. Paraspinal and other soft tissues: Negative. Disc levels: There is mild spinal canal narrowing at T10 and T12 with bulging of the ventral epidural venous plexus. MRI LUMBAR SPINE FINDINGS Segmentation:  Standard. Alignment:  Physiologic. Vertebrae: New signal abnormalities at the inferior L1 and superior L2 endplates with associated edema and contrast enhancement. There is mild superior endplate height loss at L2. Conus medullaris: Extends to the L1 level and appears normal. There is a small filum terminalis lipoma that measures up to 5 mm. Paraspinal and other soft tissues: Negative. Disc levels: L1-2: No spinal canal or neural foraminal stenosis. L2-3: Mild disc bulge without spinal canal or neural foraminal stenosis. L3-4: Disc space narrowing with endplate ridging and mild disc bulge. No spinal canal stenosis. Moderate Holder foraminal stenosis. L4-5: Disc space narrowing and endplate ridging with severe Holder foraminal stenosis. No spinal canal stenosis. L5-S1: No spinal canal or neural foraminal stenosis. IMPRESSION: 1. Multiple new bone marrow lesions of the thoracic and lumbar spine, consistent with osseous metastases. At T10 and T12, these lesions are associated with pathologic fractures with 50-60% height loss. At L2, there is a superior endplate pathologic fracture with less than 25% height loss. 2. No bony  retropulsion at the affect the levels. Mild spinal canal stenosis at T10 and T12. 3. Multilevel lumbar degenerative disc disease with severe Holder L4 and moderate Holder L3 neural foraminal stenosis. Electronically Signed   By: Ulyses Jarred M.D.   On: 07/18/2018 20:39   Nm Bone Scan Whole Body  Result Date: 07/07/2018 CLINICAL DATA:  63 year old male with prostate cancer. Denies recent trauma. Three months of low back pain. EXAM:  NUCLEAR MEDICINE WHOLE BODY BONE SCAN TECHNIQUE: Whole body anterior and posterior images were obtained approximately 3 hours after intravenous injection of radiopharmaceutical. RADIOPHARMACEUTICALS:  19.4 mCi Technetium-15m MDP IV COMPARISON:  Thoracic and lumbar MRI 04/11/2018. Prior bone scan 11/25/2016. FINDINGS: Unremarkable 2018 whole-body bone scan, and unremarkable thoracic and lumbar MRI in September this year. However, there are multiple new foci of abnormal radiotracer activity today. There is moderate to intense abnormal activity in the T11, L1, and L3 vertebral bodies. There is an elongated area of mild to moderate abnormal activity in the posterior left 9th rib. There are 2 superimposed anterior left rib foci of moderate to intense activity, approximately anterior ribs 4 and 5, and these have a linear arrangement. Expected radiotracer activity in both kidneys in the urinary bladder. No other foci of abnormal activity identified. IMPRESSION: 1. New abnormal radiotracer activity in the posterior left 9th rib, the T11, L1, and L3 vertebral bodies is highly suspicious for metastatic disease given the negative thoracic and lumbar MRI findings in September. Consider repeat MRI to confirm. 2. New but indeterminate anterior left rib foci. These might be posttraumatic. Electronically Signed   By: Genevie Ann M.D.   On: 07/07/2018 08:05      IMPRESSION/PLAN: 1. 63 y.o. male with CRMPC with new onset severe progressive low back pain and recent MRI imaging suggestive of pathologic fractures but recent CT imaging without correlate. Today, we talked to the patient and his wife about the findings and workup thus far. We discussed the natural history of metastatic prostate carcinoma and general treatment, highlighting the role of palliative radiotherapy in the management. We discussed the available radiation techniques, and focused on the details of logistics and delivery. We reviewed the anticipated acute and late  sequelae associated with radiation in this setting. The patient was encouraged to ask questions that were answered to his satisfaction.   Because we have conflicting information from recent MRI and CT imaging, we are planning to have his case presented at the upcoming multidisciplinary prostate conference on Friday January 10th to further review his imaging studies and obtain consensus recommendation for treatment and management of his back pain. We discussed the potential for proceeding with a kyphoplasty/vertebroplasty procedure with biopsy at the time of procedure for both therapeutic and diagnostic purposes but this will be further discussed at the time of [place].  He also will meet with Dr. Alen Blew on Friday January 10th to further discuss potential change in systemic treatment. We will plan to contact the patient following conference and will move forward with treatment planning accordingly at that time. If radiation is felt to be indicated, we would anticipate a two-week course of radiotherapy delivered over 10 daily treatments to the involved vertebral bodies.  We have sent a prescription for Oxy-IR to help manage his pain until we come to a formal treatment recommendation.  He understands that we will not be prescribing his pain medication long-term.  We spent 60 minutes face to face with the patient and more than 50% of that time was spent in  counseling and/or coordination of care.   Nicholos Johns, PA-C    Tyler Pita, MD  [facility] Direct Dial: [phone]  Fax: [phone] [email or web address]  Skype  [email or web address]  This document serves as a record of services personally performed by Tyler Pita, MD and Freeman Caldron, PA-C. It was created on their behalf by Rae Lips, a trained medical scribe. The creation of this record is based on the scribe's personal observations and the providers' statements to them. This document has been checked and approved by the  attending providers.

## 2018-08-05 ENCOUNTER — Telehealth: Payer: Self-pay | Admitting: Urology

## 2018-08-05 ENCOUNTER — Encounter: Payer: Self-pay | Admitting: Medical Oncology

## 2018-08-05 ENCOUNTER — Inpatient Hospital Stay: Payer: BLUE CROSS/BLUE SHIELD | Attending: Oncology | Admitting: Oncology

## 2018-08-05 ENCOUNTER — Telehealth: Payer: Self-pay

## 2018-08-05 VITALS — BP 148/87 | HR 91 | Temp 98.2°F | Resp 20 | Ht 68.0 in | Wt 197.9 lb

## 2018-08-05 DIAGNOSIS — M4854XA Collapsed vertebra, not elsewhere classified, thoracic region, initial encounter for fracture: Secondary | ICD-10-CM | POA: Diagnosis not present

## 2018-08-05 DIAGNOSIS — Z87891 Personal history of nicotine dependence: Secondary | ICD-10-CM

## 2018-08-05 DIAGNOSIS — D494 Neoplasm of unspecified behavior of bladder: Secondary | ICD-10-CM | POA: Diagnosis not present

## 2018-08-05 DIAGNOSIS — M549 Dorsalgia, unspecified: Secondary | ICD-10-CM | POA: Diagnosis not present

## 2018-08-05 DIAGNOSIS — Z8546 Personal history of malignant neoplasm of prostate: Secondary | ICD-10-CM

## 2018-08-05 DIAGNOSIS — I10 Essential (primary) hypertension: Secondary | ICD-10-CM

## 2018-08-05 DIAGNOSIS — C61 Malignant neoplasm of prostate: Secondary | ICD-10-CM

## 2018-08-05 MED ORDER — FENTANYL 25 MCG/HR TD PT72: 25.0000 ug | MEDICATED_PATCH | TRANSDERMAL | 0 refills | Status: DC

## 2018-08-05 NOTE — Telephone Encounter (Signed)
Printed avs and calender of upcoming appointment. Per 1/10 los 

## 2018-08-05 NOTE — Telephone Encounter (Signed)
I called and spoke with the patient and his wife by phone this morning following our urologic oncology multidisciplinary conference as promised, to discuss further recommendations.  After further review of imaging with the multidisciplinary team, it is felt that the spine lesions/fractures are most likely benign, osteoporotic and likely as a result of being on ADT for several years now.  The recommendation is to proceed with kyphoplasty/vertebroplasty with biopsy at that time of procedure for both therapeutic and diagnostic purposes.  The patient will meet with Dr. Alen Blew later today to discuss this further and he is planning to place the consult/order for this procedure at that time.  The patient and his wife expressed great appreciation for the call and this information and stated their intention to present for their consult visit with Dr. Alen Blew later today.  I advised that, pending results of upcoming biopsy, we would be more than happy to continue to participate in his care if clinically indicated.  Otherwise, we will plan to see him back on an as-needed basis.  The patient and his wife are comfortable with and in agreement with this plan.  Nicholos Johns, MMS, PA-C [place] [facility] [place]: [phone]  Fax: [phone]

## 2018-08-05 NOTE — Progress Notes (Signed)
Reason for the request:   Prostate cancer  HPI: I was asked by Dr. Alinda Money to evaluate [name] Holder for evaluation of prostate cancer.  He is a 63 year old man with history of hypertension and diagnosis of prostate cancer diagnosed in 2014.  At that time he presented with a PSA of 23.37 and a prostate biopsy obtained on October 21, 2012 showed a Gleason score of 3+4 = 7 with high volume disease.  He had suspicious lymphadenopathy on imaging studies.  He underwent lymphadenectomy in June 2014 which showed grossly positive metastatic disease.  He had an enlarged left external iliac lymph node that was benign however.  He started androgen deprivation therapy in June 2014 with excellent response in his PSA.  He subsequently developed castration resistant disease in April 2018 and his PSA in December 2018 was 11.3.  He subsequently started Zytiga and prednisone at that time.  His PSA was 0.73 in August 2019, 0.97 in May 2017.   He was in his usual state of health in September 2019 where he sustained a fall while walking the dogs.  He started developing more pain in his side and his back and instability.  He started participating physical therapy and while lifting heavy object started developing upper back pain.  MRI of the spine obtained on July 18, 2018 showed T10 and T12 compression fracture with 50 to 60% height loss that was noted.  Based on these findings he underwent a staging work-up including CT scan chest abdomen and pelvis that was completed on July 28, 2018.  The CT scan showed the resolution of any of his abdominal adenopathy with compression fracture in T10-11 and 12 without any pathological features.  A small bladder tumor was noted at that time.  This on these findings patient referred to me for evaluation.  Clinically, he reports continuing back pain that is not relieved by Percocet.  He was prescribed oxycodone 5 mg to take 1 to 2 tablets every 4 hours and despite him taking those medication  every 4 hours still has issues with pain.  He denies any neurological deficits, confusion or lethargy.  His performance status is declined because of his pain.  He does not report any headaches, blurry vision, syncope or seizures. Does not report any fevers, chills or sweats.  Does not report any cough, wheezing or hemoptysis.  Does not report any chest pain, palpitation, orthopnea or leg edema.  Does not report any nausea, vomiting or abdominal pain.  Does not report any constipation or diarrhea.  Does not report any skeletal complaints.    Does not report frequency, urgency or hematuria.  Does not report any skin rashes or lesions. Does not report any heat or cold intolerance.  Does not report any lymphadenopathy or petechiae.  Does not report any anxiety or depression.  Remaining review of systems is negative.    Past Medical History:  Diagnosis Date  . Allergy   . Asthma   . Elevated PSA 10/03/2012   23.37  . History of kidney stones   . History of kidney stones   . Hx of colonic polyps   . Hypertension   . Osteopenia   . Prostate cancer [name] Holder)   :  Past Surgical History:  Procedure Laterality Date  . COLONOSCOPY    . HERNIA REPAIR  ?2008  . LYMPHADENECTOMY Left 01/05/2013   Procedure: ROBOTIC LYMPHADENECTOMY;  Surgeon: Dutch Gray, MD;  Location: [facility];  Service: Urology;  Laterality: Left;  . PROSTATE BIOPSY  11/12 positive biopsies  . ROTATOR CUFF REPAIR  2012  :   Current Outpatient Medications:  .  abiraterone acetate (ZYTIGA) 250 MG tablet, Take 1,000 mg by mouth daily. , Disp: , Rfl: 10 .  albuterol (PROAIR HFA) 108 (90 Base) MCG/ACT inhaler, Inhale 2 puffs into the lungs every 6 (six) hours as needed for wheezing or shortness of breath., Disp: , Rfl:  .  albuterol (PROAIR HFA) 108 (90 Base) MCG/ACT inhaler, Inhale 2 puffs into the lungs every 6 (six) hours as needed for wheezing or shortness of breath. (Patient not taking: Reported on 08/02/2018), Disp: 8.5 g, Rfl: 3 .   azelastine (ASTELIN) 0.1 % nasal spray, Place 2 sprays into both nostrils every morning., Disp: , Rfl:  .  budesonide-formoterol (SYMBICORT) 160-4.5 MCG/ACT inhaler, Inhale 2 puffs into the lungs 2 (two) times daily., Disp: , Rfl:  .  CALCIUM-VITAMIN D PO, Take 1 tablet by mouth 2 (two) times daily., Disp: , Rfl:  .  carvedilol (COREG) 3.125 MG tablet, Take 1 tablet (3.125 mg total) by mouth 2 (two) times daily with a meal., Disp: 180 tablet, Rfl: 0 .  cyanocobalamin (,VITAMIN B-12,) 1000 MCG/ML injection, , Disp: , Rfl: 4 .  diclofenac sodium (VOLTAREN) 1 % GEL, Apply 2 g topically daily as needed (for pain). , Disp: , Rfl:  .  docusate sodium (COLACE) 100 MG capsule, Take 100 mg by mouth 2 (two) times daily., Disp: , Rfl:  .  DOXEPIN HCL PO, Take 20 mg by mouth at bedtime as needed., Disp: , Rfl:  .  ibuprofen (ADVIL,MOTRIN) 200 MG tablet, Take 200 mg by mouth 2 (two) times daily., Disp: , Rfl:  .  Leuprolide Acetate (LUPRON IJ), Inject 1 Dose as directed every 4 (four) months. This is administered by [facility], Disp: , Rfl:  .  methocarbamol (ROBAXIN) 500 MG tablet, Take 500 mg by mouth at bedtime as needed for muscle spasms., Disp: , Rfl:  .  mometasone (NASONEX) 50 MCG/ACT nasal spray, USE 1 TO 2 SPRAYS IN EACH NOSTRIL ONCE DAILY. (Patient taking differently: Place 2 sprays into the nose daily. ), Disp: 17 g, Rfl: 5 .  olmesartan (BENICAR) 40 MG tablet, TAKE 1 TABLET DAILY, Disp: 90 tablet, Rfl: 0 .  oxyCODONE (OXY IR/ROXICODONE) 5 MG immediate release tablet, Take 1 tablet (5 mg total) by mouth every 4 (four) hours as needed for severe pain (may take 1-2 tablets every 4 hours)., Disp: 120 tablet, Rfl: 0 .  oxyCODONE-acetaminophen (PERCOCET) 10-325 MG tablet, Take 2 tablets by mouth every 6 (six) hours as needed for pain., Disp: , Rfl:  .  predniSONE (DELTASONE) 5 MG tablet, Take 5 mg by mouth 2 (two) times daily. , Disp: , Rfl: 4 .  SYRINGE-NEEDLE, DISP, 3 ML 3 ML MISC, Use to inject b12  monthly., Disp: 12 each, Rfl: 0:  Allergies  Allergen Reactions  . Amlodipine Swelling    edema  . Tetracycline Rash  :  Family History  Problem Relation Age of Onset  . Arthritis Mother   . Diabetes Mother   . Hypertension Mother   . Hyperlipidemia Mother   . Hypertension Father   . Prostate cancer Father        radiation in mid 53s  . Heart attack Father   . Cancer Sister        precancerous colon polyps, 6 in colon removed  . Stroke Maternal Grandfather   :  Social History   Socioeconomic History  .  Marital status: Married    Spouse name: Not on file  . Number of children: Not on file  . Years of education: 76  . Highest education level: Not on file  Occupational History  . 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 Financial resource strain: Not on file  . Food insecurity:    Worry: Not on file    Inability: Not on file  . Transportation needs:    Medical: Not on file    Non-medical: Not on file  Tobacco Use  . Smoking status: Former Smoker    Packs/day: 0.50    Years: 15.00    Pack years: 7.50    Types: Cigarettes  . Smokeless tobacco: Never Used  Substance and Sexual Activity  . Alcohol use: Yes    Alcohol/week: 2.0 standard drinks    Types: 2 Glasses of wine per week    Comment: wine  1  glasses daily  . Drug use: No  . Sexual activity: Not Currently  Lifestyle  . Physical activity:    Days per week: Not on file    Minutes per session: Not on file  . Stress: Not on file  Relationships  . Social connections:    Talks on phone: Not on file    Gets together: Not on file    Attends religious service: Not on file    Active member of club or organization: Not on file    Attends meetings of clubs or organizations: Not on file    Relationship status: Not on file  . Intimate partner violence:    Fear of current or ex partner: Not on file    Emotionally abused: Not on file    Physically abused:  Not on file    Forced sexual activity: Not on file  Other Topics Concern  . Not on file  Social History Narrative   Regular exercise-yes   Caffeine Use-yes  :  Pertinent items are noted in HPI.  Exam: Blood pressure (!) 148/87, pulse 91, temperature 98.2 F (36.8 C), temperature source Oral, resp. rate 20, height 5\' 8"  (1.727 m), weight 197 lb 14.4 oz (89.8 kg), SpO2 100 %.  ECOG 1  General appearance: alert and cooperative appeared without distress. Head: atraumatic without any abnormalities. Eyes: conjunctivae/corneas clear. PERRL.  Sclera anicteric. Throat: lips, mucosa, and tongue normal; without oral thrush or ulcers. Resp: clear to auscultation bilaterally without rhonchi, wheezes or dullness to percussion. Cardio: regular rate and rhythm, S1, S2 normal, no murmur, click, rub or gallop GI: soft, non-tender; bowel sounds normal; no masses,  no organomegaly Skin: Skin color, texture, turgor normal. No rashes or lesions Lymph nodes: Cervical, supraclavicular, and axillary nodes normal. Neurologic: Grossly normal without any motor, sensory or deep tendon reflexes. Musculoskeletal: No joint deformity or effusion.  CBC    Component Value Date/Time   WBC 4.1 04/12/2018 0533   RBC 3.29 (L) 04/12/2018 0533   HGB 11.3 (L) 04/12/2018 0533   HCT 33.0 (L) 04/12/2018 0533   PLT 138 (L) 04/12/2018 0533   MCV 100.3 (H) 04/12/2018 0533   MCH 34.3 (H) 04/12/2018 0533   MCHC 34.2 04/12/2018 0533   RDW 12.4 04/12/2018 0533   LYMPHSABS 1.9 04/10/2018 2343   MONOABS 0.7 04/10/2018 2343   EOSABS 0.1 04/10/2018 2343   BASOSABS 0.0 04/10/2018 2343     Chemistry      Component Value Date/Time   NA 136  04/12/2018 0533   K 3.9 04/12/2018 0533   CL 106 04/12/2018 0533   CO2 27 04/12/2018 0533   BUN 11 04/12/2018 0533   CREATININE 0.70 07/18/2018 1546      Component Value Date/Time   CALCIUM 9.0 04/12/2018 0533   ALKPHOS 39 04/10/2018 2343   AST 21 04/10/2018 2343   ALT 20  04/10/2018 2343   BILITOT 0.8 04/10/2018 2343       Mr Thoracic Spine W Wo Contrast  Result Date: 07/18/2018 CLINICAL DATA:  Metastatic prostate cancer.  Mid and low back pain EXAM: MRI THORACIC AND LUMBAR SPINE WITHOUT AND WITH CONTRAST TECHNIQUE: Multiplanar and multiecho pulse sequences of the thoracic and lumbar spine were obtained without and with intravenous contrast. CONTRAST:  10 mL Gadavist COMPARISON:  Bone scan 07/06/2018 Lumbar spine MRI 04/11/2018 FINDINGS: MRI THORACIC SPINE FINDINGS Alignment:  Physiologic. Vertebrae: Abnormal marrow signal at T10 and T12 with associated pathologic fractures. There is approximately 60% height loss at T10 and T12. There is no retropulsion. Moderate marrow edema with associated contrast enhancement. Cord:  Normal signal and morphology. Paraspinal and other soft tissues: Negative. Disc levels: There is mild spinal canal narrowing at T10 and T12 with bulging of the ventral epidural venous plexus. MRI LUMBAR SPINE FINDINGS Segmentation:  Standard. Alignment:  Physiologic. Vertebrae: New signal abnormalities at the inferior L1 and superior L2 endplates with associated edema and contrast enhancement. There is mild superior endplate height loss at L2. Conus medullaris: Extends to the L1 level and appears normal. There is a small filum terminalis lipoma that measures up to 5 mm. Paraspinal and other soft tissues: Negative. Disc levels: L1-2: No spinal canal or neural foraminal stenosis. L2-3: Mild disc bulge without spinal canal or neural foraminal stenosis. L3-4: Disc space narrowing with endplate ridging and mild disc bulge. No spinal canal stenosis. Moderate right foraminal stenosis. L4-5: Disc space narrowing and endplate ridging with severe right foraminal stenosis. No spinal canal stenosis. L5-S1: No spinal canal or neural foraminal stenosis. IMPRESSION: 1. Multiple new bone marrow lesions of the thoracic and lumbar spine, consistent with osseous metastases. At  T10 and T12, these lesions are associated with pathologic fractures with 50-60% height loss. At L2, there is a superior endplate pathologic fracture with less than 25% height loss. 2. No bony retropulsion at the affect the levels. Mild spinal canal stenosis at T10 and T12. 3. Multilevel lumbar degenerative disc disease with severe right L4 and moderate right L3 neural foraminal stenosis. Electronically Signed   By: Ulyses Jarred M.D.   On: 07/18/2018 20:39   Mr Lumbar Spine W Wo Contrast  Result Date: 07/18/2018 CLINICAL DATA:  Metastatic prostate cancer.  Mid and low back pain EXAM: MRI THORACIC AND LUMBAR SPINE WITHOUT AND WITH CONTRAST TECHNIQUE: Multiplanar and multiecho pulse sequences of the thoracic and lumbar spine were obtained without and with intravenous contrast. CONTRAST:  10 mL Gadavist COMPARISON:  Bone scan 07/06/2018 Lumbar spine MRI 04/11/2018 FINDINGS: MRI THORACIC SPINE FINDINGS Alignment:  Physiologic. Vertebrae: Abnormal marrow signal at T10 and T12 with associated pathologic fractures. There is approximately 60% height loss at T10 and T12. There is no retropulsion. Moderate marrow edema with associated contrast enhancement. Cord:  Normal signal and morphology. Paraspinal and other soft tissues: Negative. Disc levels: There is mild spinal canal narrowing at T10 and T12 with bulging of the ventral epidural venous plexus. MRI LUMBAR SPINE FINDINGS Segmentation:  Standard. Alignment:  Physiologic. Vertebrae: New signal abnormalities at the inferior L1 and  superior L2 endplates with associated edema and contrast enhancement. There is mild superior endplate height loss at L2. Conus medullaris: Extends to the L1 level and appears normal. There is a small filum terminalis lipoma that measures up to 5 mm. Paraspinal and other soft tissues: Negative. Disc levels: L1-2: No spinal canal or neural foraminal stenosis. L2-3: Mild disc bulge without spinal canal or neural foraminal stenosis. L3-4: Disc  space narrowing with endplate ridging and mild disc bulge. No spinal canal stenosis. Moderate right foraminal stenosis. L4-5: Disc space narrowing and endplate ridging with severe right foraminal stenosis. No spinal canal stenosis. L5-S1: No spinal canal or neural foraminal stenosis. IMPRESSION: 1. Multiple new bone marrow lesions of the thoracic and lumbar spine, consistent with osseous metastases. At T10 and T12, these lesions are associated with pathologic fractures with 50-60% height loss. At L2, there is a superior endplate pathologic fracture with less than 25% height loss. 2. No bony retropulsion at the affect the levels. Mild spinal canal stenosis at T10 and T12. 3. Multilevel lumbar degenerative disc disease with severe right L4 and moderate right L3 neural foraminal stenosis. Electronically Signed   By: Ulyses Jarred M.D.   On: 07/18/2018 20:39    Assessment and Plan:    63 year old man with the following:  1.  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  He was diagnosed in 2014 with a PSA 23 and a Gleason score of 7.  He was treated with androgen deprivation therapy alone and subsequently developed castration resistant disease in 2018.  He is currently on Zytiga and prednisone with PSA less than 1.  The natural course of this disease was discussed today and treatment options in the future were reviewed.  These would include systemic chemotherapy, Xofigo and Xtandi.  At this time, I see no evidence to suggest recurrent disease or a rapid rise in his PSA.  I have recommended continuing Zytiga and prednisone although he understands different therapy may be in his future most likely systemic chemotherapy.  2.  Compression fracture tween T10 and T12.  Imaging studies including CT scan and MRI were personally reviewed today and discussed with radiology as a part of tumor board multidisciplinary discussion.  These findings suggestive most likely osteoporotic  compression fracture rather than pathological fractures.  After discussion today, I have recommended proceeding with kyphoplasty and a bone biopsy both for therapeutic as well as diagnostic purposes.  If metastatic disease detected to the bone, additional therapy may be needed for his prostate cancer and potentially radiation.  3.  Bone directed therapy: I have recommended starting Xgeva in addition to calcium supplement which he is taking.  He will need to obtain dental clearance before proceeding with that.  4.  Pain: Continues to have issues with pain and currently on short acting pain medication.  Risks and benefits of starting fentanyl patch was discussed today and will start him on 25 mcg daily to be changed every 72 hours.  He will use breakthrough oxycodone as needed.  Continue to emphasized the importance of a strict bowel regimen to ensure adequate bowel movements.  I am hoping after his kyphoplasty the need for pain medication will decrease.  5.  Bladder tumor: He will need a cystoscopy evaluation under the care of Dr. Alinda Money.  This will occur in the future.  6.  Follow-up: We will be in the next 4 weeks to follow his progress after kyphoplasty and a bone biopsy for final recommendation.  80  minutes was spent with the patient face-to-face today.  More than 50% of time was dedicated to reviewing his disease process, reviewing imaging studies, discussing imaging studies with radiology as well as discussing his case with multiple specialist including Dr. Alinda Money.    Thank you for the referral.  A copy of this consult has been forwarded to the requesting physician.

## 2018-08-06 DIAGNOSIS — C7952 Secondary malignant neoplasm of bone marrow: Secondary | ICD-10-CM

## 2018-08-06 DIAGNOSIS — C7951 Secondary malignant neoplasm of bone: Secondary | ICD-10-CM | POA: Insufficient documentation

## 2018-08-08 ENCOUNTER — Other Ambulatory Visit: Payer: Self-pay | Admitting: Internal Medicine

## 2018-08-08 ENCOUNTER — Other Ambulatory Visit: Payer: Self-pay | Admitting: Oncology

## 2018-08-08 DIAGNOSIS — Z8546 Personal history of malignant neoplasm of prostate: Secondary | ICD-10-CM

## 2018-08-08 DIAGNOSIS — I10 Essential (primary) hypertension: Secondary | ICD-10-CM

## 2018-08-10 ENCOUNTER — Other Ambulatory Visit: Payer: Self-pay

## 2018-08-10 ENCOUNTER — Telehealth: Payer: Self-pay | Admitting: *Deleted

## 2018-08-10 ENCOUNTER — Emergency Department (HOSPITAL_COMMUNITY): Payer: BLUE CROSS/BLUE SHIELD

## 2018-08-10 ENCOUNTER — Emergency Department (HOSPITAL_COMMUNITY)
Admission: EM | Admit: 2018-08-10 | Discharge: 2018-08-10 | Disposition: A | Discharge status: Home / Self Care | Payer: BLUE CROSS/BLUE SHIELD | Attending: Emergency Medicine | Admitting: Emergency Medicine

## 2018-08-10 ENCOUNTER — Other Ambulatory Visit (HOSPITAL_COMMUNITY): Payer: Self-pay | Admitting: Physical Medicine and Rehabilitation

## 2018-08-10 ENCOUNTER — Encounter (HOSPITAL_COMMUNITY): Payer: Self-pay | Admitting: Emergency Medicine

## 2018-08-10 DIAGNOSIS — R0609 Other forms of dyspnea: Secondary | ICD-10-CM | POA: Diagnosis not present

## 2018-08-10 DIAGNOSIS — Z87891 Personal history of nicotine dependence: Secondary | ICD-10-CM | POA: Diagnosis not present

## 2018-08-10 DIAGNOSIS — I10 Essential (primary) hypertension: Secondary | ICD-10-CM | POA: Diagnosis not present

## 2018-08-10 DIAGNOSIS — J45901 Unspecified asthma with (acute) exacerbation: Secondary | ICD-10-CM | POA: Insufficient documentation

## 2018-08-10 DIAGNOSIS — Z79899 Other long term (current) drug therapy: Secondary | ICD-10-CM | POA: Diagnosis not present

## 2018-08-10 DIAGNOSIS — J4520 Mild intermittent asthma, uncomplicated: Secondary | ICD-10-CM

## 2018-08-10 DIAGNOSIS — Z8701 Personal history of pneumonia (recurrent): Secondary | ICD-10-CM

## 2018-08-10 DIAGNOSIS — R0600 Dyspnea, unspecified: Secondary | ICD-10-CM

## 2018-08-10 DIAGNOSIS — R0602 Shortness of breath: Secondary | ICD-10-CM | POA: Diagnosis present

## 2018-08-10 HISTORY — DX: Personal history of pneumonia (recurrent): Z87.01

## 2018-08-10 LAB — CBC WITH DIFFERENTIAL/PLATELET
Abs Immature Granulocytes: 0.05 10*3/uL (ref 0.00–0.07)
Basophils Absolute: 0 10*3/uL (ref 0.0–0.1)
Basophils Relative: 0 %
Eosinophils Absolute: 0 10*3/uL (ref 0.0–0.5)
Eosinophils Relative: 0 %
HCT: 34.1 % — ABNORMAL LOW (ref 39.0–52.0)
Hemoglobin: 11.4 g/dL — ABNORMAL LOW (ref 13.0–17.0)
Immature Granulocytes: 1 %
Lymphocytes Relative: 13 %
Lymphs Abs: 1 10*3/uL (ref 0.7–4.0)
MCH: 33.3 pg (ref 26.0–34.0)
MCHC: 33.4 g/dL (ref 30.0–36.0)
MCV: 99.7 fL (ref 80.0–100.0)
Monocytes Absolute: 0.8 10*3/uL (ref 0.1–1.0)
Monocytes Relative: 10 %
Neutro Abs: 5.8 10*3/uL (ref 1.7–7.7)
Neutrophils Relative %: 76 %
Platelets: 221 10*3/uL (ref 150–400)
RBC: 3.42 MIL/uL — ABNORMAL LOW (ref 4.22–5.81)
RDW: 12.6 % (ref 11.5–15.5)
WBC: 7.6 10*3/uL (ref 4.0–10.5)
nRBC: 0 % (ref 0.0–0.2)

## 2018-08-10 LAB — COMPREHENSIVE METABOLIC PANEL
ALT: 22 U/L (ref 0–44)
AST: 18 U/L (ref 15–41)
Albumin: 3.9 g/dL (ref 3.5–5.0)
Alkaline Phosphatase: 93 U/L (ref 38–126)
Anion gap: 11 (ref 5–15)
BUN: 12 mg/dL (ref 8–23)
CO2: 24 mmol/L (ref 22–32)
Calcium: 9.5 mg/dL (ref 8.9–10.3)
Chloride: 101 mmol/L (ref 98–111)
Creatinine, Ser: 0.74 mg/dL (ref 0.61–1.24)
GFR calc Af Amer: >60 mL/min (ref >60)
GFR calc non Af Amer: >60 mL/min (ref >60)
Glucose, Bld: 116 mg/dL — ABNORMAL HIGH (ref 70–99)
Potassium: 3.7 mmol/L (ref 3.5–5.1)
Sodium: 136 mmol/L (ref 135–145)
Total Bilirubin: 0.7 mg/dL (ref 0.3–1.2)
Total Protein: 6.1 g/dL — ABNORMAL LOW (ref 6.5–8.1)

## 2018-08-10 LAB — POCT I-STAT, CHEM 8
BUN: 12 mg/dL (ref 8–23)
Calcium, Ion: 1.26 mmol/L (ref 1.15–1.40)
Chloride: 100 mmol/L (ref 98–111)
Creatinine, Ser: 0.6 mg/dL — ABNORMAL LOW (ref 0.61–1.24)
Glucose, Bld: 112 mg/dL — ABNORMAL HIGH (ref 70–99)
HCT: 33 % — ABNORMAL LOW (ref 39.0–52.0)
Hemoglobin: 11.2 g/dL — ABNORMAL LOW (ref 13.0–17.0)
Potassium: 3.7 mmol/L (ref 3.5–5.1)
Sodium: 135 mmol/L (ref 135–145)
TCO2: 27 mmol/L (ref 22–32)

## 2018-08-10 MED ORDER — FENTANYL CITRATE (PF) 100 MCG/2ML IJ SOLN
50.0000 ug | Freq: Once | INTRAMUSCULAR | Status: AC
  Administered 2018-08-10: 50 ug via INTRAVENOUS
  Filled 2018-08-10: qty 2

## 2018-08-10 MED ORDER — METHYLPREDNISOLONE SODIUM SUCC 125 MG IJ SOLR
125.0000 mg | Freq: Once | INTRAMUSCULAR | Status: AC
  Administered 2018-08-10: 125 mg via INTRAVENOUS
  Filled 2018-08-10: qty 2

## 2018-08-10 MED ORDER — ALBUTEROL SULFATE HFA 108 (90 BASE) MCG/ACT IN AERS: 2.0000 | INHALATION_SPRAY | Freq: Four times a day (QID) | RESPIRATORY_TRACT | 3 refills | Status: DC | PRN

## 2018-08-10 MED ORDER — IOPAMIDOL (ISOVUE-370) INJECTION 76%
INTRAVENOUS | Status: AC
  Filled 2018-08-10: qty 100

## 2018-08-10 MED ORDER — IOPAMIDOL (ISOVUE-370) INJECTION 76%
100.0000 mL | Freq: Once | INTRAVENOUS | Status: AC | PRN
  Administered 2018-08-10: 100 mL via INTRAVENOUS

## 2018-08-10 MED ORDER — PREDNISONE 20 MG PO TABS: 40.0000 mg | ORAL_TABLET | Freq: Every day | ORAL | 0 refills | Status: AC

## 2018-08-10 MED ORDER — SODIUM CHLORIDE (PF) 0.9 % IJ SOLN
INTRAMUSCULAR | Status: AC
  Filled 2018-08-10: qty 50

## 2018-08-10 MED ORDER — ALBUTEROL SULFATE (2.5 MG/3ML) 0.083% IN NEBU
5.0000 mg | INHALATION_SOLUTION | Freq: Once | RESPIRATORY_TRACT | Status: AC
  Administered 2018-08-10: 5 mg via RESPIRATORY_TRACT
  Filled 2018-08-10: qty 6

## 2018-08-10 NOTE — ED Notes (Signed)
Pt with wheezing while waiting for CXR, pt given neb.

## 2018-08-10 NOTE — ED Provider Notes (Signed)
[facility] DEPT Provider Note   CSN: [id number] Arrival date & time: 08/10/18  1035     History   Chief Complaint Chief Complaint  Patient presents with  . Shortness of Breath    HPI [name] Holder is a 63 y.o. male presenting for evaluation of shortness of breath.  Patient states Monday evening he developed acute worsening shortness of breath.  Shortness of breath is worse with exertion, mild at rest.  He took his home albuterol without improvement of symptoms.  Patient states it felt like an asthma exacerbation, however it was not improving.  There was concern that this may have been due to his fentanyl patch which he recently started, and as such she was told to discontinue the fentanyl patch and all other pain medication.  He denies fevers, chills, cough, nausea, vomiting, abdominal pain, urinary symptoms, abnormal bowel movements. He denies sick contacts. Pt with h/o prostate cancer, is on oral chemo and prednisone. He is not on blood thinners. Pt also has compression fx of T10-11. As such, has chronic pain. Pain today is no different than his baseline. No anterior chest pain.   Additional history obtained from chart review. Pt with h/o asthma, prostate cancer, newly found bladder lesion, Tspine compression fractures with kyphoplasty planned.  HPI  Past Medical History:  Diagnosis Date  . Allergy   . Asthma   . Elevated PSA 10/03/2012   23.37  . History of kidney stones   . History of kidney stones   . Hx of colonic polyps   . Hypertension   . Osteopenia   . Prostate cancer [facility])     Patient Active Problem List   Diagnosis Date Noted  . Secondary malignant neoplasm of bone and bone marrow ([place]) 08/06/2018  . Spinal stenosis in cervical region 04/12/2018  . Weakness 04/11/2018  . Elevated LFTs 09/16/2017  . Routine general medical examination at a health care facility 08/29/2015  . Hyperlipidemia with target LDL less than 100  12/21/2013  . Other abnormal glucose 12/21/2013  . Upper airway cough syndrome 07/25/2013  . History of prostate cancer   . Essential hypertension 05/20/2010  . Allergic rhinitis 05/20/2010  . Asthma, mild intermittent 05/20/2010    Past Surgical History:  Procedure Laterality Date  . COLONOSCOPY    . HERNIA REPAIR  ?2008  . LYMPHADENECTOMY Left 01/05/2013   Procedure: ROBOTIC LYMPHADENECTOMY;  Surgeon: Dutch Gray, MD;  Location: [facility] ORS;  Service: Urology;  Laterality: Left;  . PROSTATE BIOPSY     11/12 positive biopsies  . ROTATOR CUFF REPAIR  2012        Home Medications    Prior to Admission medications   Medication Sig Start Date End Date Taking? Authorizing Provider  abiraterone acetate (ZYTIGA) 250 MG tablet Take 1,000 mg by mouth daily.  08/17/17   [provider]  albuterol (PROAIR HFA) 108 (90 Base) MCG/ACT inhaler Inhale 2 puffs into the lungs every 6 (six) hours as needed for wheezing or shortness of breath. 04/12/18   Johnson, Clanford L, MD  albuterol (PROAIR HFA) 108 (90 Base) MCG/ACT inhaler Inhale 2 puffs into the lungs every 6 (six) hours as needed for wheezing or shortness of breath. Patient not taking: Reported on 08/02/2018 06/03/18   [name], MD  azelastine (ASTELIN) 0.1 % nasal spray Place 2 sprays into both nostrils every morning. 04/12/18   Johnson, Clanford L, MD  budesonide-formoterol (SYMBICORT) 160-4.5 MCG/ACT inhaler Inhale 2 puffs into the  lungs 2 (two) times daily. 04/12/18   Johnson, Clanford L, MD  CALCIUM-VITAMIN D PO Take 1 tablet by mouth 2 (two) times daily.    [provider]  carvedilol (COREG) 3.125 MG tablet Take 1 tablet (3.125 mg total) by mouth 2 (two) times daily with a meal. 06/09/18   [name], MD  cyanocobalamin (,VITAMIN B-12,) 1000 MCG/ML injection  05/14/18   [provider]  diclofenac sodium (VOLTAREN) 1 % GEL Apply 2 g topically daily as needed (for pain).  06/10/16   [provider]    docusate sodium (COLACE) 100 MG capsule Take 100 mg by mouth 2 (two) times daily.    [provider]  DOXEPIN HCL PO Take 20 mg by mouth at bedtime as needed.    [provider]  fentaNYL (DURAGESIC - DOSED MCG/HR) 25 MCG/HR patch Place 1 patch (25 mcg total) onto the skin every 3 (three) days. 08/05/18   Wyatt Portela, MD  ibuprofen (ADVIL,MOTRIN) 200 MG tablet Take 200 mg by mouth 2 (two) times daily.    [provider]  Leuprolide Acetate (LUPRON IJ) Inject 1 Dose as directed every 4 (four) months. This is administered by [facility]    [provider]  methocarbamol (ROBAXIN) 500 MG tablet Take 500 mg by mouth at bedtime as needed for muscle spasms.    [provider]  mometasone (NASONEX) 50 MCG/ACT nasal spray USE 1 TO 2 SPRAYS IN EACH NOSTRIL ONCE DAILY. Patient taking differently: Place 2 sprays into the nose daily.  09/16/17   [name], MD  olmesartan (BENICAR) 40 MG tablet TAKE 1 TABLET DAILY 08/08/18   [name], MD  oxyCODONE (OXY IR/ROXICODONE) 5 MG immediate release tablet Take 1 tablet (5 mg total) by mouth every 4 (four) hours as needed for severe pain (may take 1-2 tablets every 4 hours). 08/02/18   Bruning, Ashlyn, PA-C  oxyCODONE-acetaminophen (PERCOCET) 10-325 MG tablet Take 2 tablets by mouth every 6 (six) hours as needed for pain.    [provider]  predniSONE (DELTASONE) 5 MG tablet Take 5 mg by mouth 2 (two) times daily.  08/28/17   [provider]  SYRINGE-NEEDLE, DISP, 3 ML 3 ML MISC Use to inject b12 monthly. 04/19/18   [name], MD    Family History Family History  Problem Relation Age of Onset  . Arthritis Mother   . Diabetes Mother   . Hypertension Mother   . Hyperlipidemia Mother   . Hypertension Father   . Prostate cancer Father        radiation in mid 22s  . Heart attack Father   . Cancer Sister        precancerous colon polyps, 6 in colon removed  . Stroke Maternal  Grandfather     Social History Social History   Tobacco Use  . Smoking status: Former Smoker    Packs/day: 0.50    Years: 15.00    Pack years: 7.50    Types: Cigarettes  . Smokeless tobacco: Never Used  Substance Use Topics  . Alcohol use: Yes    Alcohol/week: 2.0 standard drinks    Types: 2 Glasses of wine per week    Comment: wine  1  glasses daily  . Drug use: No     Allergies   Amlodipine and Tetracycline   Review of Systems Review of Systems  Respiratory: Positive for shortness of breath.   Musculoskeletal: Positive for back pain (chronic).  All other systems reviewed and are negative.    Physical Exam Updated Vital Signs BP (!) 178/108   Pulse 89   Temp 98.5 F (36.9 C) (Oral)   Resp 17   SpO2 96%   Physical Exam Vitals signs and nursing note reviewed.  Constitutional:      General: He is not in acute distress.    Appearance: He is well-developed.     Comments: laying in the bed in NAD  HENT:     Head: Normocephalic and atraumatic.  Eyes:     Conjunctiva/sclera: Conjunctivae normal.     Pupils: Pupils are equal, round, and reactive to light.  Neck:     Musculoskeletal: Normal range of motion and neck supple.  Cardiovascular:     Rate and Rhythm: Normal rate and regular rhythm.     Pulses: Normal pulses.  Pulmonary:     Effort: Pulmonary effort is normal. No respiratory distress.     Breath sounds: Normal breath sounds. No wheezing.     Comments: Speaking in full sentences. Clear lung sounds in all fields. No respiratory distress Abdominal:     General: There is no distension.     Palpations: Abdomen is soft. There is no mass.     Tenderness: There is no abdominal tenderness. There is no guarding or rebound.  Musculoskeletal: Normal range of motion.        General: No tenderness.     Right lower leg: Edema present.     Left lower leg: Edema present.     Comments: 1+ pitting edema on R, 2+ pitting edema on L. Pt states this is baseline. No  calf erythema or tenderness.  Skin:    General: Skin is warm and dry.     Capillary Refill: Capillary refill takes less than 2 seconds.  Neurological:     Mental Status: He is alert and oriented to person, place, and time.      ED Treatments / Results  Labs (all labs ordered are listed, but only abnormal results are displayed) Labs Reviewed  CBC WITH DIFFERENTIAL/PLATELET  COMPREHENSIVE METABOLIC PANEL  I-STAT CHEM 8, ED    EKG EKG Interpretation  Date/Time:  Wednesday August 10 2018 11:45:37 EST Ventricular Rate:  83 PR Interval:    QRS Duration: 90 QT Interval:  370 QTC Calculation: 435 R Axis:   6 Text Interpretation:  Sinus rhythm Abnormal inferior Q waves No significant change since last tracing Confirmed by Dorie Rank [phone]) on 08/10/2018 11:57:13 AM   Radiology Dg Chest 2 View  Result Date: 08/10/2018 CLINICAL DATA:  Acute asthma exacerbation with approximate 2 day history of increasing shortness of breath. Current history of prostate cancer metastatic to bone treated with hormonal therapy. EXAM: CHEST - 2 VIEW COMPARISON:  CT chest 07/28/2018. Chest x-ray 01/17/2014 and earlier. FINDINGS: Cardiac silhouette normal in size, unchanged. Thoracic aorta mildly tortuous. Hilar and mediastinal contours otherwise unremarkable. Chronic scarring involving the lingula and LEFT LOWER LOBE. Focally prominent bronchovascular markings and central peribronchial thickening associated with streaky opacities in the LEFT LOWER LOBE. Lungs otherwise clear. No localized airspace consolidation. No pleural effusions. No pneumothorax. No pulmonary parenchymal nodules. Normal pulmonary vascularity. Apparent opacity projected over the INFERIOR LEFT UPPER LOBE laterally was shown on the recent CT to represent callus related to a healing ANTERIOR LEFT third rib fracture. Healed LEFT POSTERIOR third and fourth rib fractures are also present. Previously identified pathologic compression fractures of T10  and T12. IMPRESSION: Acute bronchopneumonia involving the  LEFT LOWER LOBE superimposed upon chronic scarring in the lingula and LEFT LOWER LOBE. Electronically Signed   By: Evangeline Dakin M.D.   On: 08/10/2018 12:09    Procedures Procedures (including critical care time)  Medications Ordered in ED Medications  fentaNYL (SUBLIMAZE) injection 50 mcg (has no administration in time range)  albuterol (PROVENTIL) (2.5 MG/3ML) 0.083% nebulizer solution 5 mg (5 mg Nebulization Given 08/10/18 1150)     Initial Impression / Assessment and Plan / ED Course  I have reviewed the triage vital signs and the nursing notes.  Pertinent labs & imaging results that were available during my care of the patient were reviewed by me and considered in my medical decision making (see chart for details).     Pt presenting for evaluation of shortness of breath and chronic back pain.  Physical exam shows patient who appears nontoxic.  States he feels better after a breathing treatment given in triage.  Additionally, states that he was feeling very anxious about what was going on, and this was contributing to his shortness of breath.  On exam, patient appears nontoxic.  No signs of respiratory distress.  Lungs are clear.  Consider asthma exacerbation versus pneumonia versus PE.  Pain labs, CTA, and reassess.  Fentanyl given for pain control.  Labs without leukocytosis.  Hemoglobin stable. CXR with PNA, however pt without infectious sxs. No fevers, cough or leukocytosis. Higher concern for PE, as pt has prostate cancer and is on oral chemo. CTA pending. EKG without stemi.   CTA negative for pe. No PNA on CT. Pt ambulated without difficulty or drop in SpO2. Discussed pain control with his home meds and f/u with oncology as needed. Will send home on higher dose of prednisone and close f/u with PCP. Case discussed with attending, Dr. Tomi Bamberger agrees to plan. At this time, pt appears safe for d/c. Return precautions given. Pt  states he understands and agrees to plan.    Final Clinical Impressions(s) / ED Diagnoses   Final diagnoses:  Exacerbation of asthma, unspecified asthma severity, unspecified whether persistent  Dyspnea on exertion    ED Discharge Orders    None       Franchot Heidelberg, PA-C 08/10/18 2249    Dorie Rank, MD 08/11/18 [phone]

## 2018-08-10 NOTE — ED Notes (Signed)
Patient was 98% on room air while ambulating however patient states he feels extremely "wheezy".

## 2018-08-10 NOTE — Telephone Encounter (Signed)
"  [name] Holder's wife Curt Bears [phone]).  He's asthmatic.  Called after hours with shortness of breath and advised to remove Fentanyl patch which can cause shortness of breath.  He's in really bad pain to his spine.  Took one Oxy 5 mg at 4:00 am and we're afraid to take any more.  Using inhalers but nothing helps.  Says he feels like he's smothering.  We're an hour away from ED.  Should I drive him to the office?"  Advised to report to ED for respiratory distress (coughing wheezing, rapid breathing etc.)  And contact provider managing asthma.  Notifying provider for pain medications.   "His asthma was under control until started Fentanyl patch Friday and changed patch Monday.  He doesn't need Dr. [name].  Dr. Alen Blew prescribed the fentanyl and oxy.  If I take him to ED will he receive priority treatment?"

## 2018-08-10 NOTE — Discharge Instructions (Signed)
Continue taking home medications as prescribed. Take the higher dose of prednisone for the next 4 days, and then resume your normal prednisone dosing. Use your albuterol inhaler every 4 hours for the next 2 days, after this only as needed for shortness of breath or chest tightness. Follow-up with your cancer doctor regarding further pain management. Follow-up with your primary care doctor for reevaluation of your symptoms. Return to the emergency room with any new, worsening, concerning symptoms.

## 2018-08-10 NOTE — ED Notes (Signed)
Pt 98% on room air  when ambulating. Pt stated he feels" wheezy". RN made aware.

## 2018-08-10 NOTE — ED Notes (Signed)
Will ambulate pt after CT

## 2018-08-10 NOTE — ED Triage Notes (Signed)
Pt c/o shortness of breath since last evening. Pt states SHOB worsens when lying down. Pt with hx of metastatic prostate cancer and asthma, has d/c recent pain meds, states no improvement with inhalers.Pt's lung sounds clear in triage.

## 2018-08-11 ENCOUNTER — Ambulatory Visit
Admission: RE | Admit: 2018-08-11 | Discharge: 2018-08-11 | Disposition: A | Discharge status: Home / Self Care | Payer: BLUE CROSS/BLUE SHIELD | Source: Ambulatory Visit | Attending: Oncology | Admitting: Oncology

## 2018-08-11 ENCOUNTER — Encounter: Payer: Self-pay | Admitting: Radiology

## 2018-08-11 DIAGNOSIS — Z8546 Personal history of malignant neoplasm of prostate: Secondary | ICD-10-CM

## 2018-08-11 HISTORY — PX: IR RADIOLOGIST EVAL & MGMT: IMG5224

## 2018-08-11 NOTE — Progress Notes (Signed)
Introduced myself to [name] and his wife as the prostate nurse navigator and my role. He was diagnosed with prostate cancer in 2014 and started ADT. He states he did well until 2018 when his PSA started rising. He started Zytiga and prednisone. He has done well until he had a fall walking his dogs. He had MRI that suggested metastatic disease followed by CT that show compression fractures but not mets.He was discussed this morning in cancer conference and suggested he get kyphoplasty with bone biopsy to further evaluate. His main complaint is back pain. Dr. Alen Blew has prescribed fentanyl patch along with oxycodone for pain. I gave them my business card and asked them to call with questions or concerns.

## 2018-08-11 NOTE — Consult Note (Signed)
Chief Complaint: Patient was seen in consultation today for  Chief Complaint  Patient presents with  . Consult    Consult for Kyphoplasty/Biopsy    at the request of Shadad,Firas N  Referring Physician(s): FGHWEX,HBZJI N  History of Present Illness: [name] Holder is a 63 y.o. male who has a history of prostate cancer with new onset low back pain which began in October of last year.  He was diagnosed with prostate cancer in 2014 and underwent hormone therapy as well as chemotherapy treatment.  He has done well after his treatments but developed back pain last year.  He did describe a fall in September but does not feel that this was the cause of his lumbar fractures.  He also underwent aggressive physical therapy during October and slowly noticed pain in his mid to lower back.  Over the last 2 or 3 months, it has steadily worsened and is severe at this time.  Currently, he is on both a fentanyl patch and oral narcotic medication.  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  He denies any numbness or weakness.  He is ambulatory with some difficulty due to pain.  He did undergo CT scans and MRIs since then and is here for a vertebral augmentation and biopsy referral.  Past Medical History:  Diagnosis Date  . Allergy   . Asthma   . Elevated PSA 10/03/2012   23.37  . History of kidney stones   . History of kidney stones   . Hx of colonic polyps   . Hypertension   . Osteopenia   . Prostate cancer [facility])     Past Surgical History:  Procedure Laterality Date  . COLONOSCOPY    . HERNIA REPAIR  ?2008  . LYMPHADENECTOMY Left 01/05/2013   Procedure: ROBOTIC LYMPHADENECTOMY;  Surgeon: Dutch Gray, MD;  Location: [facility];  Service: Urology;  Laterality: Left;  . PROSTATE BIOPSY     11/12 positive biopsies  . ROTATOR CUFF REPAIR  2012    Allergies: Amlodipine and Tetracycline  Medications: Prior to Admission medications    Medication Sig Start Date End Date Taking? Authorizing Provider  abiraterone acetate (ZYTIGA) 250 MG tablet Take 1,000 mg by mouth daily.  08/17/17   [provider]  albuterol (PROAIR HFA) 108 (90 Base) MCG/ACT inhaler Inhale 2 puffs into the lungs every 6 (six) hours as needed for wheezing or shortness of breath. 08/10/18   Caccavale, Sophia, PA-C  azelastine (ASTELIN) 0.1 % nasal spray Place 2 sprays into both nostrils every morning. Patient taking differently: Place 2 sprays into both nostrils daily as needed for allergies.  04/12/18   Johnson, Clanford L, MD  budesonide-formoterol (SYMBICORT) 160-4.5 MCG/ACT inhaler Inhale 2 puffs into the lungs 2 (two) times daily. 04/12/18   Johnson, Clanford L, MD  CALCIUM-VITAMIN D PO Take 1 tablet by mouth daily.     [provider]  carvedilol (COREG) 3.125 MG tablet Take 1 tablet (3.125 mg total) by mouth 2 (two) times daily with a meal. 06/09/18   [name], MD  cyanocobalamin (,VITAMIN B-12,) 1000 MCG/ML injection Inject 1,000 mcg into the muscle every 30 (thirty) days.  05/14/18   [provider]  cyclobenzaprine (FLEXERIL) 5 MG tablet Take 5 mg by mouth 3 (three) times daily as needed for muscle spasms.  05/16/18   [provider]  docusate sodium (COLACE) 100 MG capsule Take 100 mg by mouth daily.  [provider]  doxepin (SINEQUAN) 10 MG capsule Take 10 mg by mouth at bedtime.  08/02/18   [provider]  fentaNYL (DURAGESIC - DOSED MCG/HR) 25 MCG/HR patch Place 1 patch (25 mcg total) onto the skin every 3 (three) days. 08/05/18   Wyatt Portela, MD  ibuprofen (ADVIL,MOTRIN) 200 MG tablet Take 400 mg by mouth 2 (two) times daily.     [provider]  Leuprolide Acetate (LUPRON IJ) Inject 1 Dose as directed every 4 (four) months. This is administered by [facility]    [provider]  methocarbamol (ROBAXIN) 500 MG tablet Take 500 mg by mouth at bedtime as needed for  muscle spasms.    [provider]  mometasone (NASONEX) 50 MCG/ACT nasal spray USE 1 TO 2 SPRAYS IN EACH NOSTRIL ONCE DAILY. Patient taking differently: Place 2 sprays into the nose daily as needed (allergies).  09/16/17   [name], MD  olmesartan (BENICAR) 40 MG tablet TAKE 1 TABLET DAILY 08/08/18   [name], MD  oxyCODONE (OXY IR/ROXICODONE) 5 MG immediate release tablet Take 1 tablet (5 mg total) by mouth every 4 (four) hours as needed for severe pain (may take 1-2 tablets every 4 hours). 08/02/18   Bruning, Ashlyn, PA-C  predniSONE (DELTASONE) 20 MG tablet Take 2 tablets (40 mg total) by mouth daily for 4 days. 08/10/18 08/14/18  Caccavale, Sophia, PA-C  predniSONE (DELTASONE) 5 MG tablet Take 5 mg by mouth 2 (two) times daily.  08/28/17   [provider]  SYRINGE-NEEDLE, DISP, 3 ML 3 ML MISC Use to inject b12 monthly. 04/19/18   [name], MD     Family History  Problem Relation Age of Onset  . Arthritis Mother   . Diabetes Mother   . Hypertension Mother   . Hyperlipidemia Mother   . Hypertension Father   . Prostate cancer Father        radiation in mid 64s  . Heart attack Father   . Cancer Sister        precancerous colon polyps, 6 in colon removed  . Stroke Maternal Grandfather     Social History   Socioeconomic History  . Marital status: Married    Spouse name: Not on file  . Number of children: Not on file  . Years of education: 87  . Highest education level: Not on file  Occupational History  . 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 Financial resource strain: Not on file  . Food insecurity:    Worry: Not on file    Inability: Not on file  . Transportation needs:    Medical: Not on file    Non-medical: Not on file  Tobacco Use  . Smoking status: Former Smoker    Packs/day: 0.50    Years: 15.00    Pack years: 7.50    Types: Cigarettes  . Smokeless tobacco: Never Used    Substance and Sexual Activity  . Alcohol use: Yes    Alcohol/week: 2.0 standard drinks    Types: 2 Glasses of wine per week    Comment: wine  1  glasses daily  . Drug use: No  . Sexual activity: Not Currently  Lifestyle  . Physical activity:    Days per week: Not on file    Minutes per session: Not on file  . Stress: Not on file  Relationships  . Social connections:  Talks on phone: Not on file    Gets together: Not on file    Attends religious service: Not on file    Active member of club or organization: Not on file    Attends meetings of clubs or organizations: Not on file    Relationship status: Not on file  Other Topics Concern  . Not on file  Social History Narrative   Regular exercise-yes   Caffeine Use-yes    Review of Systems: A 12 point ROS discussed and pertinent positives are indicated in the HPI above.  All other systems are negative.  Review of Systems  No fevers or chills.  No numbness or weakness.  No incontinence of bowel or bladder function.  No chest pain or shortness of breath.  Vital Signs: BP 137/74   Pulse 86   Temp 98.3 F (36.8 C) (Oral)   Resp 14   Ht 5\' 7"  (1.702 m)   Wt 89.8 kg   SpO2 100%   BMI 31.01 kg/m   Physical Exam  He is in no apparent distress.  Examination of his back demonstrates no obvious deformity.  He does have some localization of his pain to the thoracolumbar region, just above the small of his back.  Little if any tenderness is elicited.  Neurological exam is nonfocal.  His motor function is 5 out of 5 throughout.  Sensory function is also intact.  He is alert and oriented x4.   Imaging: Dg Chest 2 View  Result Date: 08/10/2018 CLINICAL DATA:  Acute asthma exacerbation with approximate 2 day history of increasing shortness of breath. Current history of prostate cancer metastatic to bone treated with hormonal therapy. EXAM: CHEST - 2 VIEW COMPARISON:  CT chest 07/28/2018. Chest x-ray 01/17/2014 and earlier.  FINDINGS: Cardiac silhouette normal in size, unchanged. Thoracic aorta mildly tortuous. Hilar and mediastinal contours otherwise unremarkable. Chronic scarring involving the lingula and LEFT LOWER LOBE. Focally prominent bronchovascular markings and central peribronchial thickening associated with streaky opacities in the LEFT LOWER LOBE. Lungs otherwise clear. No localized airspace consolidation. No pleural effusions. No pneumothorax. No pulmonary parenchymal nodules. Normal pulmonary vascularity. Apparent opacity projected over the INFERIOR LEFT UPPER LOBE laterally was shown on the recent CT to represent callus related to a healing ANTERIOR LEFT third rib fracture. Healed LEFT POSTERIOR third and fourth rib fractures are also present. Previously identified pathologic compression fractures of T10 and T12. IMPRESSION: Acute bronchopneumonia involving the LEFT LOWER LOBE superimposed upon chronic scarring in the lingula and LEFT LOWER LOBE. Electronically Signed   By: Evangeline Dakin M.D.   On: 08/10/2018 12:09   Ct Angio Chest Pe W/cm &/or Wo Cm  Result Date: 08/10/2018 CLINICAL DATA:  Shortness of breath.  Metastatic prostate cancer. EXAM: CT ANGIOGRAPHY CHEST WITH CONTRAST TECHNIQUE: Multidetector CT imaging of the chest was performed using the standard protocol during bolus administration of intravenous contrast. Multiplanar CT image reconstructions and MIPs were obtained to evaluate the vascular anatomy. CONTRAST:  131mL ISOVUE-370 IOPAMIDOL (ISOVUE-370) INJECTION 76% COMPARISON:  Chest x-ray 08/10/2018 and CT scan dated 07/28/2018 FINDINGS: Cardiovascular: Satisfactory opacification of the pulmonary arteries to the segmental level. No evidence of pulmonary embolism. Normal heart size. No pericardial effusion. Mediastinum/Nodes: No enlarged mediastinal, hilar, or axillary lymph nodes. Thyroid gland, trachea, and esophagus demonstrate no significant findings. Lungs/Pleura: There is minimal linear  atelectasis at the lung bases. No infiltrates or effusions. Upper Abdomen: Negative. Musculoskeletal: There is a severe compression fracture of T10 in the less severe compression  fracture of T12, slightly progressed at T10. No acute bone abnormality. The patient has known metastatic prostate cancer. Review of the MIP images confirms the above findings. IMPRESSION: 1. No pulmonary emboli or other acute abnormalities other than slight atelectasis at the lung bases. 2. Progressive pathologic fracture of T10. No visible tumor in the spinal canal. No visible progression of the pathologic fracture of T12. Electronically Signed   By: Lorriane Shire M.D.   On: 08/10/2018 14:58   Mr Thoracic Spine W Wo Contrast  Result Date: 07/18/2018 CLINICAL DATA:  Metastatic prostate cancer.  Mid and low back pain EXAM: MRI THORACIC AND LUMBAR SPINE WITHOUT AND WITH CONTRAST TECHNIQUE: Multiplanar and multiecho pulse sequences of the thoracic and lumbar spine were obtained without and with intravenous contrast. CONTRAST:  10 mL Gadavist COMPARISON:  Bone scan 07/06/2018 Lumbar spine MRI 04/11/2018 FINDINGS: MRI THORACIC SPINE FINDINGS Alignment:  Physiologic. Vertebrae: Abnormal marrow signal at T10 and T12 with associated pathologic fractures. There is approximately 60% height loss at T10 and T12. There is no retropulsion. Moderate marrow edema with associated contrast enhancement. Cord:  Normal signal and morphology. Paraspinal and other soft tissues: Negative. Disc levels: There is mild spinal canal narrowing at T10 and T12 with bulging of the ventral epidural venous plexus. MRI LUMBAR SPINE FINDINGS Segmentation:  Standard. Alignment:  Physiologic. Vertebrae: New signal abnormalities at the inferior L1 and superior L2 endplates with associated edema and contrast enhancement. There is mild superior endplate height loss at L2. Conus medullaris: Extends to the L1 level and appears normal. There is a small filum terminalis lipoma  that measures up to 5 mm. Paraspinal and other soft tissues: Negative. Disc levels: L1-2: No spinal canal or neural foraminal stenosis. L2-3: Mild disc bulge without spinal canal or neural foraminal stenosis. L3-4: Disc space narrowing with endplate ridging and mild disc bulge. No spinal canal stenosis. Moderate right foraminal stenosis. L4-5: Disc space narrowing and endplate ridging with severe right foraminal stenosis. No spinal canal stenosis. L5-S1: No spinal canal or neural foraminal stenosis. IMPRESSION: 1. Multiple new bone marrow lesions of the thoracic and lumbar spine, consistent with osseous metastases. At T10 and T12, these lesions are associated with pathologic fractures with 50-60% height loss. At L2, there is a superior endplate pathologic fracture with less than 25% height loss. 2. No bony retropulsion at the affect the levels. Mild spinal canal stenosis at T10 and T12. 3. Multilevel lumbar degenerative disc disease with severe right L4 and moderate right L3 neural foraminal stenosis. Electronically Signed   By: Ulyses Jarred M.D.   On: 07/18/2018 20:39   Mr Lumbar Spine W Wo Contrast  Result Date: 07/18/2018 CLINICAL DATA:  Metastatic prostate cancer.  Mid and low back pain EXAM: MRI THORACIC AND LUMBAR SPINE WITHOUT AND WITH CONTRAST TECHNIQUE: Multiplanar and multiecho pulse sequences of the thoracic and lumbar spine were obtained without and with intravenous contrast. CONTRAST:  10 mL Gadavist COMPARISON:  Bone scan 07/06/2018 Lumbar spine MRI 04/11/2018 FINDINGS: MRI THORACIC SPINE FINDINGS Alignment:  Physiologic. Vertebrae: Abnormal marrow signal at T10 and T12 with associated pathologic fractures. There is approximately 60% height loss at T10 and T12. There is no retropulsion. Moderate marrow edema with associated contrast enhancement. Cord:  Normal signal and morphology. Paraspinal and other soft tissues: Negative. Disc levels: There is mild spinal canal narrowing at T10 and T12 with  bulging of the ventral epidural venous plexus. MRI LUMBAR SPINE FINDINGS Segmentation:  Standard. Alignment:  Physiologic. Vertebrae: New signal  abnormalities at the inferior L1 and superior L2 endplates with associated edema and contrast enhancement. There is mild superior endplate height loss at L2. Conus medullaris: Extends to the L1 level and appears normal. There is a small filum terminalis lipoma that measures up to 5 mm. Paraspinal and other soft tissues: Negative. Disc levels: L1-2: No spinal canal or neural foraminal stenosis. L2-3: Mild disc bulge without spinal canal or neural foraminal stenosis. L3-4: Disc space narrowing with endplate ridging and mild disc bulge. No spinal canal stenosis. Moderate right foraminal stenosis. L4-5: Disc space narrowing and endplate ridging with severe right foraminal stenosis. No spinal canal stenosis. L5-S1: No spinal canal or neural foraminal stenosis. IMPRESSION: 1. Multiple new bone marrow lesions of the thoracic and lumbar spine, consistent with osseous metastases. At T10 and T12, these lesions are associated with pathologic fractures with 50-60% height loss. At L2, there is a superior endplate pathologic fracture with less than 25% height loss. 2. No bony retropulsion at the affect the levels. Mild spinal canal stenosis at T10 and T12. 3. Multilevel lumbar degenerative disc disease with severe right L4 and moderate right L3 neural foraminal stenosis. Electronically Signed   By: Ulyses Jarred M.D.   On: 07/18/2018 20:39    Labs:  CBC: Recent Labs    04/10/18 2343 04/11/18 0543 04/12/18 0533 08/10/18 1359 08/10/18 1409  WBC 6.3 5.4 4.1 7.6  --   HGB 13.1 11.0* 11.3* 11.4* 11.2*  HCT 37.6* 32.2* 33.0* 34.1* 33.0*  PLT 217 158 138* 221  --     COAGS: No results for input(s): INR, APTT in the last 8760 hours.  BMP: Recent Labs    09/16/17 1056 04/10/18 2343 04/11/18 0543 04/12/18 0533 07/18/18 1546 08/10/18 1359 08/10/18 1409  NA 137 134*   --  136  --  136 135  K 4.2 3.8  --  3.9  --  3.7 3.7  CL 103 103  --  106  --  101 100  CO2 28 19*  --  27  --  24  --   GLUCOSE 108* 97  --  108*  --  116* 112*  BUN 17 15  --  11  --  12 12  CALCIUM 9.7 9.3  --  9.0  --  9.5  --   CREATININE 0.91 0.79 0.74 0.81 0.70 0.74 0.60*  GFRNONAA  --  >60 >60 >60  --  >60  --   GFRAA  --  >60 >60 >60  --  >60  --     LIVER FUNCTION TESTS: Recent Labs    09/02/17 0915 09/16/17 1056 04/10/18 2343 08/10/18 1359  BILITOT 1.0 1.2 0.8 0.7  AST 149* 49* 21 18  ALT 274* 107* 20 22  ALKPHOS 52 49 39 93  PROT 6.8 6.5 7.0 6.1*  ALBUMIN 4.3 4.0 4.2 3.9    TUMOR MARKERS: No results for input(s): AFPTM, CEA, CA199, CHROMGRNA in the last 8760 hours.  Assessment and Plan:  Mr. Noboa has debilitating back pain as described above.  It has steadily worsened over the last few months.  His imaging briefly reveals compression fractures at T10 and T12 with abnormal sclerosis but no obvious bony destruction.  He also has a suspected inferior endplate fracture at L1 and superior endplate fracture at L2.  These fractures are subtle with some bone marrow edema.  He has no other evidence of metastatic disease throughout his thoracic or lumbar spine.  It is possible that these  represent pathologic fractures, however this is not definitive.  Also, there is no obvious bony destruction.  I would recommend vertebroplasty and kyphoplasty in conjunction with biopsy.  I would not recommend radiofrequency ablation at this time.  I did offer doing a biopsy first and coming back for a vertebroplasty versus ablation.  We opted to do the biopsy and vertebral augmentation.  If the biopsy does prove positive, he can undergo radiation therapy for treatment.  The levels to be treated will be T10 and T12 given the localization of his pain.  L1 and L2 will be observed at this time.  The procedure of vertebroplasty and kyphoplasty was explained.  His questions were answered.  Thank  you for this interesting consult.  I greatly enjoyed meeting [name] Holder and look forward to participating in their care.  A copy of this report was sent to the requesting provider on this date.  Electronically Signed: Art A Trayvond Viets 08/11/2018, 11:27 AM   I spent a total of  40 Minutes   in face to face in clinical consultation, greater than 50% of which was counseling/coordinating care for kyphoplasty.

## 2018-08-12 ENCOUNTER — Other Ambulatory Visit: Payer: Self-pay | Admitting: Oncology

## 2018-08-12 MED ORDER — OXYCODONE HCL 5 MG PO TABS: 5.0000 mg | ORAL_TABLET | ORAL | 0 refills | Status: DC | PRN

## 2018-08-15 ENCOUNTER — Ambulatory Visit (INDEPENDENT_AMBULATORY_CARE_PROVIDER_SITE_OTHER)
Admission: RE | Admit: 2018-08-15 | Discharge: 2018-08-15 | Disposition: A | Discharge status: Home / Self Care | Payer: BLUE CROSS/BLUE SHIELD | Source: Ambulatory Visit | Attending: Family | Admitting: Family

## 2018-08-15 ENCOUNTER — Telehealth: Payer: Self-pay | Admitting: *Deleted

## 2018-08-15 ENCOUNTER — Encounter: Payer: Self-pay | Admitting: Family

## 2018-08-15 ENCOUNTER — Ambulatory Visit: Payer: BLUE CROSS/BLUE SHIELD | Admitting: Family

## 2018-08-15 ENCOUNTER — Other Ambulatory Visit (HOSPITAL_COMMUNITY): Payer: Self-pay | Admitting: Interventional Radiology

## 2018-08-15 VITALS — BP 138/84 | HR 82 | Temp 98.4°F | Ht 67.0 in | Wt 193.1 lb

## 2018-08-15 DIAGNOSIS — R0602 Shortness of breath: Secondary | ICD-10-CM

## 2018-08-15 DIAGNOSIS — J4541 Moderate persistent asthma with (acute) exacerbation: Secondary | ICD-10-CM | POA: Diagnosis not present

## 2018-08-15 DIAGNOSIS — J209 Acute bronchitis, unspecified: Secondary | ICD-10-CM | POA: Diagnosis not present

## 2018-08-15 MED ORDER — AZITHROMYCIN 250 MG PO TABS: ORAL_TABLET | ORAL | 0 refills | Status: DC

## 2018-08-15 MED ORDER — IPRATROPIUM-ALBUTEROL 0.5-2.5 (3) MG/3ML IN SOLN
3.0000 mL | Freq: Once | RESPIRATORY_TRACT | Status: AC
  Administered 2018-08-15: 3 mL via RESPIRATORY_TRACT

## 2018-08-15 MED ORDER — ALBUTEROL SULFATE (2.5 MG/3ML) 0.083% IN NEBU: 2.5000 mg | INHALATION_SOLUTION | Freq: Four times a day (QID) | RESPIRATORY_TRACT | 1 refills | Status: DC | PRN

## 2018-08-15 MED ORDER — PREDNISONE 10 MG PO TABS: ORAL_TABLET | ORAL | 0 refills | Status: DC

## 2018-08-15 NOTE — Progress Notes (Signed)
[name] Holder is a 63 y.o. male with the following history as recorded in Epic[employer]:  Patient Active Problem List   Diagnosis Date Noted  . Secondary malignant neoplasm of bone and bone marrow ([place]) 08/06/2018  . Spinal stenosis in cervical region 04/12/2018  . Weakness 04/11/2018  . Elevated LFTs 09/16/2017  . Routine general medical examination at a health care facility 08/29/2015  . Hyperlipidemia with target LDL less than 100 12/21/2013  . Other abnormal glucose 12/21/2013  . Upper airway cough syndrome 07/25/2013  . History of prostate cancer   . Essential hypertension 05/20/2010  . Allergic rhinitis 05/20/2010  . Asthma, mild intermittent 05/20/2010    Current Outpatient Medications  Medication Sig Dispense Refill  . abiraterone acetate (ZYTIGA) 250 MG tablet Take 1,000 mg by mouth daily.   10  . albuterol (PROAIR HFA) 108 (90 Base) MCG/ACT inhaler Inhale 2 puffs into the lungs every 6 (six) hours as needed for wheezing or shortness of breath. 8.5 g 3  . azelastine (ASTELIN) 0.1 % nasal spray Place 2 sprays into both nostrils every morning. (Patient taking differently: Place 2 sprays into both nostrils daily as needed for allergies. )    . budesonide-formoterol (SYMBICORT) 160-4.5 MCG/ACT inhaler Inhale 2 puffs into the lungs 2 (two) times daily.    [name] CALCIUM-VITAMIN D PO Take 1 tablet by mouth daily.     . carvedilol (COREG) 3.125 MG tablet Take 1 tablet (3.125 mg total) by mouth 2 (two) times daily with a meal. 180 tablet 0  . cyanocobalamin (,VITAMIN B-12,) 1000 MCG/ML injection Inject 1,000 mcg into the muscle every 30 (thirty) days.   4  . docusate sodium (COLACE) 100 MG capsule Take 100 mg by mouth daily.     . fentaNYL (DURAGESIC - DOSED MCG/HR) 25 MCG/HR patch Place 1 patch (25 mcg total) onto the skin every 3 (three) days. 5 patch 0  . ibuprofen (ADVIL,MOTRIN) 200 MG tablet Take 400 mg by mouth 2 (two) times daily.     [name] Leuprolide Acetate (LUPRON IJ) Inject 1 Dose as  directed every 4 (four) months. This is administered by [facility]    . methocarbamol (ROBAXIN) 500 MG tablet Take 500 mg by mouth at bedtime as needed for muscle spasms.    . mometasone (NASONEX) 50 MCG/ACT nasal spray USE 1 TO 2 SPRAYS IN EACH NOSTRIL ONCE DAILY. (Patient taking differently: Place 2 sprays into the nose daily as needed (allergies). ) 17 g 5  . olmesartan (BENICAR) 40 MG tablet TAKE 1 TABLET DAILY 90 tablet 0  . oxyCODONE (OXY IR/ROXICODONE) 5 MG immediate release tablet Take 1 tablet (5 mg total) by mouth every 4 (four) hours as needed for severe pain (may take 1-2 tablets every 4 hours). 120 tablet 0  . predniSONE (DELTASONE) 5 MG tablet Take 5 mg by mouth 2 (two) times daily.   4  . SYRINGE-NEEDLE, DISP, 3 ML 3 ML MISC Use to inject b12 monthly. 12 each 0  . albuterol (PROVENTIL) (2.5 MG/3ML) 0.083% nebulizer solution Take 3 mLs (2.5 mg total) by nebulization every 6 (six) hours as needed for wheezing or shortness of breath. 150 mL 1  . azithromycin (ZITHROMAX) 250 MG tablet 2 tabs po qd x 1 day; 1 tablet per day x 4 days; 6 tablet 0  . cyclobenzaprine (FLEXERIL) 5 MG tablet Take 5 mg by mouth 3 (three) times daily as needed for muscle spasms.     [name] doxepin (SINEQUAN) 10 MG capsule  Take 10 mg by mouth at bedtime.     . predniSONE (DELTASONE) 10 MG tablet Take 3 tablets daily x 3 days; then 2 tablets daily x 3 days; then 1 tablet daily x 3 days; 18 tablet 0   No current facility-administered medications for this visit.     Allergies: Amlodipine and Tetracycline  Past Medical History:  Diagnosis Date  . Allergy   . Asthma   . Elevated PSA 10/03/2012   23.37  . History of kidney stones   . History of kidney stones   . Hx of colonic polyps   . Hypertension   . Osteopenia   . Prostate cancer [facility])     Past Surgical History:  Procedure Laterality Date  . COLONOSCOPY    . HERNIA REPAIR  ?2008  . IR RADIOLOGIST EVAL & MGMT  08/11/2018  . LYMPHADENECTOMY Left  01/05/2013   Procedure: ROBOTIC LYMPHADENECTOMY;  Surgeon: Dutch Gray, MD;  Location: [facility];  Service: Urology;  Laterality: Left;  . PROSTATE BIOPSY     11/12 positive biopsies  . ROTATOR CUFF REPAIR  2012    Family History  Problem Relation Age of Onset  . Arthritis Mother   . Diabetes Mother   . Hypertension Mother   . Hyperlipidemia Mother   . Hypertension Father   . Prostate cancer Father        radiation in mid 78s  . Heart attack Father   . Cancer Sister        precancerous colon polyps, 6 in colon removed  . Stroke Maternal Grandfather     Social History   Tobacco Use  . Smoking status: Former Smoker    Packs/day: 0.50    Years: 15.00    Pack years: 7.50    Types: Cigarettes  . Smokeless tobacco: Never Used  Substance Use Topics  . Alcohol use: Yes    Alcohol/week: 2.0 standard drinks    Types: 2 Glasses of wine per week    Comment: wine  1  glasses daily    Subjective:  Seen at ER last week with wheezing/ shortness of breath/ asthma exacerbation; CXR did show pneumonia but not thought to be bacterial- treated with prednisone/ no antibiotics; took 4 days of Prednisone (40 mg) with some relief but had to go to [facility] yesterday with similar symptoms- was treated with nebulizer treatment;  Concerned that symptoms could be related to transdermal Fentanyl patch; notes that within a few hours of placing the Fentanyl patch he is experiencing wheezing; Does have underlying asthma- takes Symbicort twice a day daily; concerned about how frequently he is having to use his rescue inhaler in the past week.     Objective:  Vitals:   08/15/18 1030  BP: 138/84  Pulse: 82  Temp: 98.4 F (36.9 C)  TempSrc: Oral  SpO2: 99%  Weight: 193 lb 1.3 oz (87.6 kg)  Height: 5\' 7"  (1.702 m)    General: Well developed, well nourished, in no acute distress  Skin : Warm and dry.  Head: Normocephalic and atraumatic  Eyes: Sclera and conjunctiva clear; pupils round and reactive to light;  extraocular movements intact  Ears: External normal; canals clear; tympanic membranes normal  Oropharynx: Pink, supple. No suspicious lesions  Neck: Supple without thyromegaly, adenopathy  Lungs: Respirations unlabored; expiratory wheeze noted CVS exam: normal rate and regular rhythm.  Neurologic: Alert and oriented; speech intact; face symmetrical; moves all extremities well; CNII-XII intact without focal deficit   Assessment:  1.  Shortness of breath   2. Moderate persistent asthma with acute bronchitis and acute exacerbation     Plan:  Repeat CXR- no pneumonia seen today; duo-neb is given in office with good relief; will have patient start Z-pak to cover for possible infection; also extend prednisone taper x 9 days, he will go home with his own nebulizer as well- plan to use every 6-8 hours over the next few days; re-check planned for later this week, sooner prn; Increase fluids,rest as well.   No follow-ups on file.  Orders Placed This Encounter  Procedures  . DG Chest 2 View    Standing Status:   Future    Number of Occurrences:   1    Standing Expiration Date:   10/14/2019    Order Specific Question:   Reason for Exam (SYMPTOM  OR DIAGNOSIS REQUIRED)    Answer:   cough/ shortness of breath    Order Specific Question:   Preferred imaging location?    Answer:   [facility]    Order Specific Question:   Radiology Contrast Protocol - do NOT remove file path    Answer:   [email or web address]    Requested Prescriptions   Signed Prescriptions Disp Refills  . predniSONE (DELTASONE) 10 MG tablet 18 tablet 0    Sig: Take 3 tablets daily x 3 days; then 2 tablets daily x 3 days; then 1 tablet daily x 3 days;  . azithromycin (ZITHROMAX) 250 MG tablet 6 tablet 0    Sig: 2 tabs po qd x 1 day; 1 tablet per day x 4 days;  . albuterol (PROVENTIL) (2.5 MG/3ML) 0.083% nebulizer solution 150 mL 1    Sig: Take 3 mLs (2.5 mg total) by nebulization every 6  (six) hours as needed for wheezing or shortness of breath.

## 2018-08-15 NOTE — Telephone Encounter (Signed)
Wife states they have stopped using the fentanyl patch due to his reaction. They saw PCP this morning who put him on a steroid taper.   They want to know if there is an alternative to fentanyl patch. Is currently using the oxycodone for pain.

## 2018-08-16 ENCOUNTER — Other Ambulatory Visit: Payer: Self-pay | Admitting: Physician Assistant

## 2018-08-17 ENCOUNTER — Ambulatory Visit (HOSPITAL_COMMUNITY)
Admission: RE | Admit: 2018-08-17 | Discharge: 2018-08-17 | Disposition: A | Discharge status: Home / Self Care | Payer: BLUE CROSS/BLUE SHIELD | Source: Ambulatory Visit | Attending: Oncology | Admitting: Oncology

## 2018-08-17 ENCOUNTER — Encounter (HOSPITAL_COMMUNITY): Payer: Self-pay | Admitting: Interventional Radiology

## 2018-08-17 ENCOUNTER — Other Ambulatory Visit: Payer: Self-pay | Admitting: Urology

## 2018-08-17 ENCOUNTER — Other Ambulatory Visit: Payer: Self-pay | Admitting: Oncology

## 2018-08-17 ENCOUNTER — Other Ambulatory Visit: Payer: Self-pay

## 2018-08-17 DIAGNOSIS — Z823 Family history of stroke: Secondary | ICD-10-CM | POA: Diagnosis not present

## 2018-08-17 DIAGNOSIS — Z8249 Family history of ischemic heart disease and other diseases of the circulatory system: Secondary | ICD-10-CM | POA: Diagnosis not present

## 2018-08-17 DIAGNOSIS — J45909 Unspecified asthma, uncomplicated: Secondary | ICD-10-CM | POA: Insufficient documentation

## 2018-08-17 DIAGNOSIS — Z87891 Personal history of nicotine dependence: Secondary | ICD-10-CM | POA: Diagnosis not present

## 2018-08-17 DIAGNOSIS — Z8546 Personal history of malignant neoplasm of prostate: Secondary | ICD-10-CM | POA: Diagnosis not present

## 2018-08-17 DIAGNOSIS — Z79899 Other long term (current) drug therapy: Secondary | ICD-10-CM | POA: Insufficient documentation

## 2018-08-17 DIAGNOSIS — Z8042 Family history of malignant neoplasm of prostate: Secondary | ICD-10-CM | POA: Diagnosis not present

## 2018-08-17 DIAGNOSIS — Z881 Allergy status to other antibiotic agents status: Secondary | ICD-10-CM | POA: Diagnosis not present

## 2018-08-17 DIAGNOSIS — M8580 Other specified disorders of bone density and structure, unspecified site: Secondary | ICD-10-CM | POA: Insufficient documentation

## 2018-08-17 DIAGNOSIS — I10 Essential (primary) hypertension: Secondary | ICD-10-CM | POA: Insufficient documentation

## 2018-08-17 DIAGNOSIS — Z7951 Long term (current) use of inhaled steroids: Secondary | ICD-10-CM | POA: Diagnosis not present

## 2018-08-17 DIAGNOSIS — M4854XA Collapsed vertebra, not elsewhere classified, thoracic region, initial encounter for fracture: Secondary | ICD-10-CM | POA: Insufficient documentation

## 2018-08-17 DIAGNOSIS — M899 Disorder of bone, unspecified: Secondary | ICD-10-CM | POA: Insufficient documentation

## 2018-08-17 DIAGNOSIS — Z8261 Family history of arthritis: Secondary | ICD-10-CM | POA: Diagnosis not present

## 2018-08-17 DIAGNOSIS — Z888 Allergy status to other drugs, medicaments and biological substances status: Secondary | ICD-10-CM | POA: Insufficient documentation

## 2018-08-17 HISTORY — PX: IR KYPHO THORACIC WITH BONE BIOPSY: IMG5518

## 2018-08-17 HISTORY — PX: IR KYPHO EA ADDL LEVEL THORACIC OR LUMBAR: IMG5520

## 2018-08-17 LAB — PROTIME-INR
INR: 0.95
Prothrombin Time: 12.6 seconds (ref 11.4–15.2)

## 2018-08-17 LAB — CBC
HCT: 35.5 % — ABNORMAL LOW (ref 39.0–52.0)
Hemoglobin: 12.2 g/dL — ABNORMAL LOW (ref 13.0–17.0)
MCH: 33.5 pg (ref 26.0–34.0)
MCHC: 34.4 g/dL (ref 30.0–36.0)
MCV: 97.5 fL (ref 80.0–100.0)
Platelets: 276 10*3/uL (ref 150–400)
RBC: 3.64 MIL/uL — ABNORMAL LOW (ref 4.22–5.81)
RDW: 12.5 % (ref 11.5–15.5)
WBC: 8.5 10*3/uL (ref 4.0–10.5)
nRBC: 0 % (ref 0.0–0.2)

## 2018-08-17 MED ORDER — MIDAZOLAM HCL 2 MG/2ML IJ SOLN
INTRAMUSCULAR | Status: AC
  Filled 2018-08-17: qty 2

## 2018-08-17 MED ORDER — CEFAZOLIN SODIUM-DEXTROSE 2-4 GM/100ML-% IV SOLN
INTRAVENOUS | Status: AC
  Administered 2018-08-17: 2 g via INTRAVENOUS
  Filled 2018-08-17: qty 100

## 2018-08-17 MED ORDER — FENTANYL CITRATE (PF) 100 MCG/2ML IJ SOLN
INTRAMUSCULAR | Status: AC
  Filled 2018-08-17: qty 2

## 2018-08-17 MED ORDER — SODIUM CHLORIDE 0.9 % IV SOLN: INTRAVENOUS | Status: DC

## 2018-08-17 MED ORDER — FENTANYL CITRATE (PF) 100 MCG/2ML IJ SOLN
INTRAMUSCULAR | Status: AC | PRN
  Administered 2018-08-17 (×4): 50 ug via INTRAVENOUS

## 2018-08-17 MED ORDER — CEFAZOLIN SODIUM-DEXTROSE 2-4 GM/100ML-% IV SOLN
2.0000 g | Freq: Once | INTRAVENOUS | Status: AC
  Administered 2018-08-17: 2 g via INTRAVENOUS

## 2018-08-17 MED ORDER — LIDOCAINE HCL (PF) 1 % IJ SOLN
INTRAMUSCULAR | Status: AC
  Filled 2018-08-17: qty 30

## 2018-08-17 MED ORDER — IOPAMIDOL (ISOVUE-300) INJECTION 61%
INTRAVENOUS | Status: AC
  Administered 2018-08-17: 20 mL
  Filled 2018-08-17: qty 50

## 2018-08-17 MED ORDER — FENTANYL CITRATE (PF) 100 MCG/2ML IJ SOLN
INTRAMUSCULAR | Status: AC | PRN
  Administered 2018-08-17: 50 ug via INTRAVENOUS

## 2018-08-17 MED ORDER — MIDAZOLAM HCL 2 MG/2ML IJ SOLN
INTRAMUSCULAR | Status: AC | PRN
  Administered 2018-08-17 (×4): 1 mg via INTRAVENOUS

## 2018-08-17 MED ORDER — LIDOCAINE HCL 1 % IJ SOLN
INTRAMUSCULAR | Status: AC | PRN
  Administered 2018-08-17: 10 mL

## 2018-08-17 NOTE — Procedures (Signed)
T10/T12 KP and bone Bx EBL 0 Comp 0

## 2018-08-17 NOTE — Progress Notes (Signed)
Chief Complaint: Patient was seen in consultation today for kyphoplasty at the request of Shadad,Firas N  Referring Physician(s): Wyatt Portela  Supervising Physician: Marybelle Killings  Patient Status: [facility] - Out-pt  History of Present Illness: [name] Holder is a 63 y.o. male scheduled for KP and biopsy of T10 and T12 symptomatic and possibly pathologic fractures. He was seen last week by Dr. Barbie Banner and discussed the procedure. Since then, he has been seen by his pulmonary team for his asthma. Some adjustments were made in his treatments including inhalers and po steroids. He was also put on a Zpak as a precaution but he had CXR which did not show PNA. He thinks his anxiety is leading to some of his respiratory sxs as well. Today, he is not SOB, just a little nervous about the procedure. Denies fevers, chills, CP, cough, N/V. Wife at bedside  Past Medical History:  Diagnosis Date  . Allergy   . Asthma   . Elevated PSA 10/03/2012   23.37  . History of kidney stones   . History of kidney stones   . Hx of colonic polyps   . Hypertension   . Osteopenia   . Prostate cancer [facility])     Past Surgical History:  Procedure Laterality Date  . COLONOSCOPY    . HERNIA REPAIR  ?2008  . IR RADIOLOGIST EVAL & MGMT  08/11/2018  . LYMPHADENECTOMY Left 01/05/2013   Procedure: ROBOTIC LYMPHADENECTOMY;  Surgeon: Dutch Gray, MD;  Location: [facility];  Service: Urology;  Laterality: Left;  . PROSTATE BIOPSY     11/12 positive biopsies  . ROTATOR CUFF REPAIR  2012    Allergies: Amlodipine and Tetracycline  Medications: Prior to Admission medications   Medication Sig Start Date End Date Taking? Authorizing Provider  abiraterone acetate (ZYTIGA) 250 MG tablet Take 1,000 mg by mouth daily.  08/17/17   [provider]  albuterol (PROAIR HFA) 108 (90 Base) MCG/ACT inhaler Inhale 2 puffs into the lungs every 6 (six) hours as needed for wheezing or shortness of breath. 08/10/18   Caccavale,  Sophia, PA-C  albuterol (PROVENTIL) (2.5 MG/3ML) 0.083% nebulizer solution Take 3 mLs (2.5 mg total) by nebulization every 6 (six) hours as needed for wheezing or shortness of breath. 08/15/18   Marrian Salvage, FNP  azelastine (ASTELIN) 0.1 % nasal spray Place 2 sprays into both nostrils every morning. Patient taking differently: Place 2 sprays into both nostrils daily as needed for allergies.  04/12/18   Murlean Iba, MD  azithromycin (ZITHROMAX) 250 MG tablet 2 tabs po qd x 1 day; 1 tablet per day x 4 days; 08/15/18   Marrian Salvage, FNP  budesonide-formoterol [facility]) 160-4.5 MCG/ACT inhaler Inhale 2 puffs into the lungs 2 (two) times daily. 04/12/18   Johnson, Clanford L, MD  CALCIUM-VITAMIN D PO Take 1 tablet by mouth daily.     [provider]  carvedilol (COREG) 3.125 MG tablet Take 1 tablet (3.125 mg total) by mouth 2 (two) times daily with a meal. 06/09/18   [name], MD  cyanocobalamin (,VITAMIN B-12,) 1000 MCG/ML injection Inject 1,000 mcg into the muscle every 30 (thirty) days.  05/14/18   [provider]  cyclobenzaprine (FLEXERIL) 5 MG tablet Take 5 mg by mouth 3 (three) times daily as needed for muscle spasms.  05/16/18   [provider]  docusate sodium (COLACE) 100 MG capsule Take 100 mg by mouth daily.     [provider]  doxepin (  SINEQUAN) 10 MG capsule Take 10 mg by mouth at bedtime.  08/02/18   [provider]  fentaNYL (DURAGESIC - DOSED MCG/HR) 25 MCG/HR patch Place 1 patch (25 mcg total) onto the skin every 3 (three) days. 08/05/18   Wyatt Portela, MD  ibuprofen (ADVIL,MOTRIN) 200 MG tablet Take 400 mg by mouth 2 (two) times daily.     [provider]  Leuprolide Acetate (LUPRON IJ) Inject 1 Dose as directed every 4 (four) months. This is administered by [facility]    [provider]  methocarbamol (ROBAXIN) 500 MG tablet Take 500 mg by mouth at bedtime as needed for muscle spasms.     [provider]  mometasone (NASONEX) 50 MCG/ACT nasal spray USE 1 TO 2 SPRAYS IN EACH NOSTRIL ONCE DAILY. Patient taking differently: Place 2 sprays into the nose daily as needed (allergies).  09/16/17   [name], MD  olmesartan (BENICAR) 40 MG tablet TAKE 1 TABLET DAILY 08/08/18   [name], MD  oxyCODONE (OXY IR/ROXICODONE) 5 MG immediate release tablet Take 1 tablet (5 mg total) by mouth every 4 (four) hours as needed for severe pain (may take 1-2 tablets every 4 hours). 08/12/18   Wyatt Portela, MD  predniSONE (DELTASONE) 10 MG tablet Take 3 tablets daily x 3 days; then 2 tablets daily x 3 days; then 1 tablet daily x 3 days; 08/15/18   Marrian Salvage, FNP  predniSONE (DELTASONE) 5 MG tablet Take 5 mg by mouth 2 (two) times daily.  08/28/17   [provider]  SYRINGE-NEEDLE, DISP, 3 ML 3 ML MISC Use to inject b12 monthly. 04/19/18   [name], MD     Family History  Problem Relation Age of Onset  . Arthritis Mother   . Diabetes Mother   . Hypertension Mother   . Hyperlipidemia Mother   . Hypertension Father   . Prostate cancer Father        radiation in mid 60s  . Heart attack Father   . Cancer Sister        precancerous colon polyps, 6 in colon removed  . Stroke Maternal Grandfather     Social History   Socioeconomic History  . Marital status: Married    Spouse name: Not on file  . Number of children: Not on file  . Years of education: 67  . Highest education level: Not on file  Occupational History  . 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 Financial resource strain: Not on file  . Food insecurity:    Worry: Not on file    Inability: Not on file  . Transportation needs:    Medical: Not on file    Non-medical: Not on file  Tobacco Use  . Smoking status: Former Smoker    Packs/day: 0.50    Years: 15.00    Pack years: 7.50    Types: Cigarettes  . Smokeless tobacco:  Never Used  Substance and Sexual Activity  . Alcohol use: Yes    Alcohol/week: 2.0 standard drinks    Types: 2 Glasses of wine per week    Comment: wine  1  glasses daily  . Drug use: No  . Sexual activity: Not Currently  Lifestyle  . Physical activity:    Days per week: Not on file    Minutes per session: Not on file  . Stress: Not on file  Relationships  . Social connections:    Talks on phone: Not on file    Gets together: Not on file    Attends religious service: Not on file    Active member of club or organization: Not on file    Attends meetings of clubs or organizations: Not on file    Relationship status: Not on file  Other Topics Concern  . Not on file  Social History Narrative   Regular exercise-yes   Caffeine Use-yes     Review of Systems: A 12 point ROS discussed and pertinent positives are indicated in the HPI above.  All other systems are negative.  Review of Systems  Vital Signs: BP (!) 162/89   Pulse 91   Temp 99 F (37.2 C) (Oral)   Resp 18   Ht 5\' 8"  (1.727 m)   Wt 87.5 kg   SpO2 100%   BMI 29.35 kg/m   Physical Exam Constitutional:      Appearance: Normal appearance.  HENT:     Mouth/Throat:     Mouth: Mucous membranes are moist.     Pharynx: Oropharynx is clear.  Cardiovascular:     Rate and Rhythm: Normal rate and regular rhythm.     Pulses: Normal pulses.     Heart sounds: Normal heart sounds.  Pulmonary:     Effort: Pulmonary effort is normal. No respiratory distress.     Breath sounds: Normal breath sounds. No wheezing, rhonchi or rales.  Skin:    General: Skin is warm and dry.  Neurological:     General: No focal deficit present.     Mental Status: He is alert and oriented to person, place, and time.  Psychiatric:        Mood and Affect: Mood normal.        Judgment: Judgment normal.     Imaging: Dg Chest 2 View  Result Date: 08/15/2018 CLINICAL DATA:  Cough and shortness of breath. Follow-up pneumonia. History of  metastatic prostate cancer. EXAM: CHEST - 2 VIEW COMPARISON:  Chest radiographs and CTA 08/10/2018 FINDINGS: The cardiomediastinal silhouette is unchanged with normal heart size. Curvilinear opacities in the lingula and basilar left lower lobe are similar to the prior studies. No acute airspace consolidation, edema, pleural effusion, pneumothorax is identified. Vertebral fractures are again seen in the lower thoracic and lumbar spine. IMPRESSION: Chronic atelectasis or scarring in the left lung base. No evidence of active cardiopulmonary disease. Electronically Signed   By: Logan Bores M.D.   On: 08/15/2018 11:29   Dg Chest 2 View  Result Date: 08/10/2018 CLINICAL DATA:  Acute asthma exacerbation with approximate 2 day history of increasing shortness of breath. Current history of prostate cancer metastatic to bone treated with hormonal therapy. EXAM: CHEST - 2 VIEW COMPARISON:  CT chest 07/28/2018. Chest x-ray 01/17/2014 and earlier. FINDINGS: Cardiac silhouette normal in size, unchanged. Thoracic aorta mildly tortuous. Hilar and mediastinal contours otherwise unremarkable. Chronic scarring involving the lingula and LEFT LOWER LOBE. Focally prominent bronchovascular markings and central peribronchial thickening associated with streaky opacities in the LEFT LOWER LOBE. Lungs otherwise clear. No localized airspace consolidation. No pleural effusions. No pneumothorax. No pulmonary parenchymal nodules. Normal pulmonary vascularity. Apparent opacity projected over the INFERIOR LEFT UPPER LOBE laterally was shown on the recent CT to represent callus related to a healing ANTERIOR LEFT third rib fracture. Healed LEFT POSTERIOR third and fourth rib fractures are also present. Previously identified pathologic compression fractures of T10 and T12. IMPRESSION: Acute bronchopneumonia  involving the LEFT LOWER LOBE superimposed upon chronic scarring in the lingula and LEFT LOWER LOBE. Electronically Signed   By: Evangeline Dakin M.D.   On: 08/10/2018 12:09   Ct Angio Chest Pe W/cm &/or Wo Cm  Result Date: 08/10/2018 CLINICAL DATA:  Shortness of breath.  Metastatic prostate cancer. EXAM: CT ANGIOGRAPHY CHEST WITH CONTRAST TECHNIQUE: Multidetector CT imaging of the chest was performed using the standard protocol during bolus administration of intravenous contrast. Multiplanar CT image reconstructions and MIPs were obtained to evaluate the vascular anatomy. CONTRAST:  160mL ISOVUE-370 IOPAMIDOL (ISOVUE-370) INJECTION 76% COMPARISON:  Chest x-ray 08/10/2018 and CT scan dated 07/28/2018 FINDINGS: Cardiovascular: Satisfactory opacification of the pulmonary arteries to the segmental level. No evidence of pulmonary embolism. Normal heart size. No pericardial effusion. Mediastinum/Nodes: No enlarged mediastinal, hilar, or axillary lymph nodes. Thyroid gland, trachea, and esophagus demonstrate no significant findings. Lungs/Pleura: There is minimal linear atelectasis at the lung bases. No infiltrates or effusions. Upper Abdomen: Negative. Musculoskeletal: There is a severe compression fracture of T10 in the less severe compression fracture of T12, slightly progressed at T10. No acute bone abnormality. The patient has known metastatic prostate cancer. Review of the MIP images confirms the above findings. IMPRESSION: 1. No pulmonary emboli or other acute abnormalities other than slight atelectasis at the lung bases. 2. Progressive pathologic fracture of T10. No visible tumor in the spinal canal. No visible progression of the pathologic fracture of T12. Electronically Signed   By: Lorriane Shire M.D.   On: 08/10/2018 14:58   Mr Thoracic Spine W Wo Contrast  Result Date: 07/18/2018 CLINICAL DATA:  Metastatic prostate cancer.  Mid and low back pain EXAM: MRI THORACIC AND LUMBAR SPINE WITHOUT AND WITH CONTRAST TECHNIQUE: Multiplanar and multiecho pulse sequences of the thoracic and lumbar spine were obtained without and with intravenous  contrast. CONTRAST:  10 mL Gadavist COMPARISON:  Bone scan 07/06/2018 Lumbar spine MRI 04/11/2018 FINDINGS: MRI THORACIC SPINE FINDINGS Alignment:  Physiologic. Vertebrae: Abnormal marrow signal at T10 and T12 with associated pathologic fractures. There is approximately 60% height loss at T10 and T12. There is no retropulsion. Moderate marrow edema with associated contrast enhancement. Cord:  Normal signal and morphology. Paraspinal and other soft tissues: Negative. Disc levels: There is mild spinal canal narrowing at T10 and T12 with bulging of the ventral epidural venous plexus. MRI LUMBAR SPINE FINDINGS Segmentation:  Standard. Alignment:  Physiologic. Vertebrae: New signal abnormalities at the inferior L1 and superior L2 endplates with associated edema and contrast enhancement. There is mild superior endplate height loss at L2. Conus medullaris: Extends to the L1 level and appears normal. There is a small filum terminalis lipoma that measures up to 5 mm. Paraspinal and other soft tissues: Negative. Disc levels: L1-2: No spinal canal or neural foraminal stenosis. L2-3: Mild disc bulge without spinal canal or neural foraminal stenosis. L3-4: Disc space narrowing with endplate ridging and mild disc bulge. No spinal canal stenosis. Moderate right foraminal stenosis. L4-5: Disc space narrowing and endplate ridging with severe right foraminal stenosis. No spinal canal stenosis. L5-S1: No spinal canal or neural foraminal stenosis. IMPRESSION: 1. Multiple new bone marrow lesions of the thoracic and lumbar spine, consistent with osseous metastases. At T10 and T12, these lesions are associated with pathologic fractures with 50-60% height loss. At L2, there is a superior endplate pathologic fracture with less than 25% height loss. 2. No bony retropulsion at the affect the levels. Mild spinal canal stenosis at T10 and T12. 3. Multilevel  lumbar degenerative disc disease with severe right L4 and moderate right L3 neural  foraminal stenosis. Electronically Signed   By: Ulyses Jarred M.D.   On: 07/18/2018 20:39   Mr Lumbar Spine W Wo Contrast  Result Date: 07/18/2018 CLINICAL DATA:  Metastatic prostate cancer.  Mid and low back pain EXAM: MRI THORACIC AND LUMBAR SPINE WITHOUT AND WITH CONTRAST TECHNIQUE: Multiplanar and multiecho pulse sequences of the thoracic and lumbar spine were obtained without and with intravenous contrast. CONTRAST:  10 mL Gadavist COMPARISON:  Bone scan 07/06/2018 Lumbar spine MRI 04/11/2018 FINDINGS: MRI THORACIC SPINE FINDINGS Alignment:  Physiologic. Vertebrae: Abnormal marrow signal at T10 and T12 with associated pathologic fractures. There is approximately 60% height loss at T10 and T12. There is no retropulsion. Moderate marrow edema with associated contrast enhancement. Cord:  Normal signal and morphology. Paraspinal and other soft tissues: Negative. Disc levels: There is mild spinal canal narrowing at T10 and T12 with bulging of the ventral epidural venous plexus. MRI LUMBAR SPINE FINDINGS Segmentation:  Standard. Alignment:  Physiologic. Vertebrae: New signal abnormalities at the inferior L1 and superior L2 endplates with associated edema and contrast enhancement. There is mild superior endplate height loss at L2. Conus medullaris: Extends to the L1 level and appears normal. There is a small filum terminalis lipoma that measures up to 5 mm. Paraspinal and other soft tissues: Negative. Disc levels: L1-2: No spinal canal or neural foraminal stenosis. L2-3: Mild disc bulge without spinal canal or neural foraminal stenosis. L3-4: Disc space narrowing with endplate ridging and mild disc bulge. No spinal canal stenosis. Moderate right foraminal stenosis. L4-5: Disc space narrowing and endplate ridging with severe right foraminal stenosis. No spinal canal stenosis. L5-S1: No spinal canal or neural foraminal stenosis. IMPRESSION: 1. Multiple new bone marrow lesions of the thoracic and lumbar spine,  consistent with osseous metastases. At T10 and T12, these lesions are associated with pathologic fractures with 50-60% height loss. At L2, there is a superior endplate pathologic fracture with less than 25% height loss. 2. No bony retropulsion at the affect the levels. Mild spinal canal stenosis at T10 and T12. 3. Multilevel lumbar degenerative disc disease with severe right L4 and moderate right L3 neural foraminal stenosis. Electronically Signed   By: Ulyses Jarred M.D.   On: 07/18/2018 20:39   Ir Radiologist Eval & Mgmt  Result Date: 08/11/2018 Please refer to notes tab for details about interventional procedure. (Op Note)   Labs:  CBC: Recent Labs    04/10/18 2343 04/11/18 0543 04/12/18 0533 08/10/18 1359 08/10/18 1409  WBC 6.3 5.4 4.1 7.6  --   HGB 13.1 11.0* 11.3* 11.4* 11.2*  HCT 37.6* 32.2* 33.0* 34.1* 33.0*  PLT 217 158 138* 221  --     COAGS: No results for input(s): INR, APTT in the last 8760 hours.  BMP: Recent Labs    09/16/17 1056 04/10/18 2343 04/11/18 0543 04/12/18 0533 07/18/18 1546 08/10/18 1359 08/10/18 1409  NA 137 134*  --  136  --  136 135  K 4.2 3.8  --  3.9  --  3.7 3.7  CL 103 103  --  106  --  101 100  CO2 28 19*  --  27  --  24  --   GLUCOSE 108* 97  --  108*  --  116* 112*  BUN 17 15  --  11  --  12 12  CALCIUM 9.7 9.3  --  9.0  --  9.5  --  CREATININE 0.91 0.79 0.74 0.81 0.70 0.74 0.60*  GFRNONAA  --  >60 >60 >60  --  >60  --   GFRAA  --  >60 >60 >60  --  >60  --     LIVER FUNCTION TESTS: Recent Labs    09/02/17 0915 09/16/17 1056 04/10/18 2343 08/10/18 1359  BILITOT 1.0 1.2 0.8 0.7  AST 149* 49* 21 18  ALT 274* 107* 20 22  ALKPHOS 52 49 39 93  PROT 6.8 6.5 7.0 6.1*  ALBUMIN 4.3 4.0 4.2 3.9    TUMOR MARKERS: No results for input(s): AFPTM, CEA, CA199, CHROMGRNA in the last 8760 hours.  Assessment and Plan: Symptomatic T10 and T12 fractures. Possible pathologic in setting of prostate cancer. Plan to proceed with biopsy  and VP/KP today. Labs pending. Risks and benefits of kyphoplasty were discussed with the patient including, but not limited to education regarding the natural healing process of compression fractures without intervention, bleeding, infection, cement migration which may cause spinal cord damage, paralysis, pulmonary embolism or even death.  This interventional procedure involves the use of X-rays and because of the nature of the planned procedure, it is possible that we will have prolonged use of X-ray fluoroscopy.  Potential radiation risks to you include (but are not limited to) the following: - A slightly elevated risk for cancer  several years later in life. This risk is typically less than 0.5% percent. This risk is low in comparison to the normal incidence of human cancer, which is 33% for women and 50% for men according to the [employer]. - Radiation induced injury can include skin redness, resembling a rash, tissue breakdown / ulcers and hair loss (which can be temporary or permanent).   The likelihood of either of these occurring depends on the difficulty of the procedure and whether you are sensitive to radiation due to previous procedures, disease, or genetic conditions.   IF your procedure requires a prolonged use of radiation, you will be notified and given written instructions for further action.  It is your responsibility to monitor the irradiated area for the 2 weeks following the procedure and to notify your physician if you are concerned that you have suffered a radiation induced injury.    All of the patient's questions were answered, patient is agreeable to proceed.  Consent signed and in chart.   Thank you for this interesting consult.  I greatly enjoyed meeting [name] Holder and look forward to participating in their care.  A copy of this report was sent to the requesting provider on this date.  Electronically Signed: Ascencion Dike, PA-C 08/17/2018,  11:16 AM   I spent a total of 20 minutes in face to face in clinical consultation, greater than 50% of which was counseling/coordinating care for KP

## 2018-08-17 NOTE — Discharge Instructions (Addendum)
Percutaneous Vertebroplasty, Care After °This sheet gives you information about how to care for yourself after your procedure. Your doctor may also give you more specific instructions. If you have problems or questions, contact your doctor. °Follow these instructions at home: °Surgical cut (incision) care °· Follow instructions from your doctor about how to take care of your cut from surgery. Make sure you: °? Wash your hands with soap and water before you change your bandage (dressing). If you cannot use soap and water, use hand sanitizer. °? Change your bandage as told by your doctor. °? Leave stitches (sutures), skin glue, or skin tape (adhesive) strips in place. They may need to stay in place for 2 weeks or longer. If tape strips get loose and curl up, you may trim the loose edges. Do not remove tape strips completely unless your doctor says it is okay. °· Check your surgical cut area every day for signs of infection. Check for: °? Redness, swelling, or pain. °? Fluid or blood. °? Warmth. °? Pus or a bad smell. °· Keep the bandage dry as told by your doctor. Do not shower or bathe until your doctor says it is okay. °Managing pain, stiffness, and swelling ° °· If directed, apply ice to the affected area: °? Put ice in a plastic bag. °? Place a towel between your skin and bag. °? Leave the ice on for 20 minutes, 2-3 times a day. °· Rest for 24 hrs after the procedure or as told by your doctor. °Activity °· Slowly return to normal activities as told by your doctor. °· Ask what type of stretching and strengthening exercises you should do. °· Do not bend or lift anything greater than 10 lb (4.5 kg). Follow your doctor's instructions about bending and lifting. °General instructions °· Take over-the-counter and prescription medicines only as told by your doctor. °· Do not drive for 24 hours if you were given a medicine to help you relax (sedative). °· To prevent or treat constipation while you are taking prescription  pain medicine, your doctor may recommend that you: °? Drink enough fluid to keep your pee (urine) clear or pale yellow. °? Take over-the-counter or prescription medicines. °? Eat foods that are high in fiber, such as: °§ Fresh fruits. °§ Fresh vegetables. °§ Whole grains. °§ Beans. °? Limit foods that are high in fat and processed sugars, such as fried and sweet foods. °? Keep all follow-up visits as told by your doctor. This is important. °Contact a doctor if: °· You have redness, swelling, or pain around your cut. °· You have fluid or blood coming from your cut. °· Your cut feels warm to the touch. °· You have pus or bad smell coming from your cut. °· You have a fever. °· You are sick to your stomach (nauseous) or throw up (vomit) for more than 24 hours. °· Your back pain does not get better. °Get help right away if: °· You have very bad back pain that comes on all of a sudden. °· You cannot control when you pee or poop (bowel movement). °· You lose feeling (become numb) or have tingling in your legs or feet, or they become weak. °· You have new tingling, numbness, or weakness in your legs or feet. °· You have sudden weakness in your legs. °· You have pain that shoots down your legs. °· You have chest pain. °· You have trouble breathing. °· You are short of breath. °· You feel dizzy or you pass   out (faint). °· Your vision changes or you cannot talk as you normally do. °Summary °· Rest for 24 hrs after the procedure and return slowly to normal activities as told by your doctor. °· Do not drive for 24 hours if you were given a medicine to help you relax (sedative). °· Take over-the-counter and prescription medicines only as told by your doctor. °This information is not intended to replace advice given to you by your health care provider. Make sure you discuss any questions you have with your health care provider. °Document Released: 10/07/2009 Document Revised: 10/13/2016 Document Reviewed: 10/13/2016 °Elsevier  Interactive Patient Education © 2019 [employer] °Balloon Kyphoplasty, Care After °Refer to this sheet in the next few weeks. These instructions provide you with information about caring for yourself after your procedure. Your health care provider may also give you more specific instructions. Your treatment has been planned according to current medical practices, but problems sometimes occur. Call your health care provider if you have any problems or questions after your procedure. °What can I expect after the procedure? °After your procedure, it is common to have back pain. °Follow these instructions at home: °Incision care °· Follow instructions from your health care provider about how to take care of your incisions. Make sure you: °? Wash your hands with soap and water before you change your bandage (dressing). If soap and water are not available, use hand sanitizer. °? Change your dressing as told by your health care provider. °? Leave stitches (sutures), skin glue, or adhesive strips in place. These skin closures may need to be in place for 2 weeks or longer. If adhesive strip edges start to loosen and curl up, you may trim the loose edges. Do not remove adhesive strips completely unless your health care provider tells you to do that. °· Check your incision area every day for signs of infection. Watch for: °? Redness, swelling, or pain. °? Fluid, blood, or pus. °· Keep your dressing dry until your health care provider says that it can be removed. °Activity ° °· Rest your back and avoid intense physical activity for as long as told by your health care provider. °· Return to your normal activities as told by your health care provider. Ask your health care provider what activities are safe for you. °· Do not lift anything that is heavier than 10 lb (4.5 kg). This is about the weight of a gallon of milk. You may need to avoid heavy lifting for several weeks. °General instructions °· Take over-the-counter and  prescription medicines only as told by your health care provider. °· If directed, apply ice to the painful area: °? Put ice in a plastic bag. °? Place a towel between your skin and the bag. °? Leave the ice on for 20 minutes, 2-3 times per day. °· Do not use tobacco products, including cigarettes, chewing tobacco, or e-cigarettes. If you need help quitting, ask your health care provider. °· Keep all follow-up visits as told by your health care provider. This is important. °Contact a health care provider if: °· You have a fever. °· You have redness, swelling, or pain at the site of your incisions. °· You have fluid, blood, or pus coming from your incisions. °· You have pain that gets worse or does not get better with medicine. °· You develop numbness or weakness in any part of your body. °Get help right away if: °· You have chest pain. °· You have difficulty breathing. °· You cannot move   your legs. °· You cannot control your bladder or bowel movements. °· You suddenly become weak or numb on one side of your body. °· You become very confused. °· You have trouble speaking or understanding, or both. °This information is not intended to replace advice given to you by your health care provider. Make sure you discuss any questions you have with your health care provider. °Document Released: 04/03/2015 Document Revised: 12/19/2015 Document Reviewed: 11/05/2014 °Elsevier Interactive Patient Education © 2019 [employer] °Moderate Conscious Sedation, Adult, Care After °These instructions provide you with information about caring for yourself after your procedure. Your health care provider may also give you more specific instructions. Your treatment has been planned according to current medical practices, but problems sometimes occur. Call your health care provider if you have any problems or questions after your procedure. °What can I expect after the procedure? °After your procedure, it is common: °· To feel sleepy for  several hours. °· To feel clumsy and have poor balance for several hours. °· To have poor judgment for several hours. °· To vomit if you eat too soon. °Follow these instructions at home: °For at least 24 hours after the procedure: ° °· Do not: °? Participate in activities where you could fall or become injured. °? Drive. °? Use heavy machinery. °? Drink alcohol. °? Take sleeping pills or medicines that cause drowsiness. °? Make important decisions or sign legal documents. °? Take care of children on your own. °· Rest. °Eating and drinking °· Follow the diet recommended by your health care provider. °· If you vomit: °? Drink water, juice, or soup when you can drink without vomiting. °? Make sure you have little or no nausea before eating solid foods. °General instructions °· Have a responsible adult stay with you until you are awake and alert. °· Take over-the-counter and prescription medicines only as told by your health care provider. °· If you smoke, do not smoke without supervision. °· Keep all follow-up visits as told by your health care provider. This is important. °Contact a health care provider if: °· You keep feeling nauseous or you keep vomiting. °· You feel light-headed. °· You develop a rash. °· You have a fever. °Get help right away if: °· You have trouble breathing. °This information is not intended to replace advice given to you by your health care provider. Make sure you discuss any questions you have with your health care provider. °Document Released: 05/03/2013 Document Revised: 12/16/2015 Document Reviewed: 11/02/2015 °Elsevier Interactive Patient Education © 2019 [employer]. ° °

## 2018-08-18 ENCOUNTER — Other Ambulatory Visit: Payer: Self-pay | Admitting: Oncology

## 2018-08-18 MED ORDER — OXYCODONE HCL 5 MG PO TABS: 5.0000 mg | ORAL_TABLET | ORAL | 0 refills | Status: DC | PRN

## 2018-08-19 ENCOUNTER — Ambulatory Visit: Payer: BLUE CROSS/BLUE SHIELD | Admitting: Family

## 2018-08-19 ENCOUNTER — Other Ambulatory Visit: Payer: Self-pay | Admitting: Internal Medicine

## 2018-08-19 ENCOUNTER — Encounter: Payer: Self-pay | Admitting: Family

## 2018-08-19 VITALS — BP 138/90 | HR 97 | Temp 98.4°F | Ht 68.0 in | Wt 189.2 lb

## 2018-08-19 DIAGNOSIS — J4521 Mild intermittent asthma with (acute) exacerbation: Secondary | ICD-10-CM

## 2018-08-19 MED ORDER — PREDNISONE 10 MG (21) PO TBPK: ORAL_TABLET | ORAL | 0 refills | Status: DC

## 2018-08-19 MED ORDER — IPRATROPIUM-ALBUTEROL 0.5-2.5 (3) MG/3ML IN SOLN: 3.0000 mL | RESPIRATORY_TRACT | 1 refills | Status: DC | PRN

## 2018-08-19 NOTE — Progress Notes (Signed)
[name] Holder is a 63 y.o. male with the following history as recorded in [employer]:  Patient Active Problem List   Diagnosis Date Noted  . Secondary malignant neoplasm of bone and bone marrow ([facility]) 08/06/2018  . Spinal stenosis in cervical region 04/12/2018  . Weakness 04/11/2018  . Elevated LFTs 09/16/2017  . Routine general medical examination at a health care facility 08/29/2015  . Hyperlipidemia with target LDL less than 100 12/21/2013  . Other abnormal glucose 12/21/2013  . Upper airway cough syndrome 07/25/2013  . History of prostate cancer   . Essential hypertension 05/20/2010  . Allergic rhinitis 05/20/2010  . Asthma, mild intermittent 05/20/2010    Current Outpatient Medications  Medication Sig Dispense Refill  . abiraterone acetate (ZYTIGA) 250 MG tablet Take 1,000 mg by mouth daily.   10  . albuterol (PROAIR HFA) 108 (90 Base) MCG/ACT inhaler Inhale 2 puffs into the lungs every 6 (six) hours as needed for wheezing or shortness of breath. 8.5 g 3  . albuterol (PROVENTIL) (2.5 MG/3ML) 0.083% nebulizer solution Take 3 mLs (2.5 mg total) by nebulization every 6 (six) hours as needed for wheezing or shortness of breath. 150 mL 1  . azelastine (ASTELIN) 0.1 % nasal spray Place 2 sprays into both nostrils every morning. (Patient taking differently: Place 2 sprays into both nostrils daily as needed for allergies. )    . azithromycin (ZITHROMAX) 250 MG tablet 2 tabs po qd x 1 day; 1 tablet per day x 4 days; 6 tablet 0  . budesonide-formoterol (SYMBICORT) 160-4.5 MCG/ACT inhaler Inhale 2 puffs into the lungs 2 (two) times daily.    [name] CALCIUM-VITAMIN D PO Take 1 tablet by mouth daily.     . carvedilol (COREG) 3.125 MG tablet Take 1 tablet (3.125 mg total) by mouth 2 (two) times daily with a meal. 180 tablet 0  . cyanocobalamin (,VITAMIN B-12,) 1000 MCG/ML injection Inject 1,000 mcg into the muscle every 30 (thirty) days.   4  . cyclobenzaprine (FLEXERIL) 5 MG tablet Take 5 mg by  mouth 3 (three) times daily as needed for muscle spasms.     [name] docusate sodium (COLACE) 100 MG capsule Take 100 mg by mouth daily.     [name] doxepin (SINEQUAN) 10 MG capsule Take 10 mg by mouth at bedtime.     . fentaNYL (DURAGESIC - DOSED MCG/HR) 25 MCG/HR patch Place 1 patch (25 mcg total) onto the skin every 3 (three) days. 5 patch 0  . ibuprofen (ADVIL,MOTRIN) 200 MG tablet Take 400 mg by mouth 2 (two) times daily.     [name] Leuprolide Acetate (LUPRON IJ) Inject 1 Dose as directed every 4 (four) months. This is administered by [facility]    . methocarbamol (ROBAXIN) 500 MG tablet Take 500 mg by mouth at bedtime as needed for muscle spasms.    . mometasone (NASONEX) 50 MCG/ACT nasal spray USE 1 TO 2 SPRAYS IN EACH NOSTRIL ONCE DAILY. (Patient taking differently: Place 2 sprays into the nose daily as needed (allergies). ) 17 g 5  . NARCAN 4 MG/0.1ML LIQD nasal spray kit     . olmesartan (BENICAR) 40 MG tablet TAKE 1 TABLET DAILY 90 tablet 0  . oxyCODONE (OXY IR/ROXICODONE) 5 MG immediate release tablet Take 1 tablet (5 mg total) by mouth every 4 (four) hours as needed for severe pain (may take 1-2 tablets every 4 hours). 120 tablet 0  . SYRINGE-NEEDLE, DISP, 3 ML 3 ML MISC Use to inject b12 monthly.  12 each 0  . ipratropium-albuterol (DUONEB) 0.5-2.5 (3) MG/3ML SOLN Take 3 mLs by nebulization every 4 (four) hours as needed. 360 mL 1  . predniSONE (STERAPRED UNI-PAK 21 TAB) 10 MG (21) TBPK tablet Taper as directed 21 tablet 0   No current facility-administered medications for this visit.     Allergies: Amlodipine and Tetracycline  Past Medical History:  Diagnosis Date  . Allergy   . Asthma   . Elevated PSA 10/03/2012   23.37  . History of kidney stones   . History of kidney stones   . Hx of colonic polyps   . Hypertension   . Osteopenia   . Prostate cancer [facility])     Past Surgical History:  Procedure Laterality Date  . COLONOSCOPY    . HERNIA REPAIR  ?2008  . IR KYPHO EA ADDL LEVEL  THORACIC OR LUMBAR  08/17/2018  . IR KYPHO THORACIC WITH BONE BIOPSY  08/17/2018  . IR RADIOLOGIST EVAL & MGMT  08/11/2018  . LYMPHADENECTOMY Left 01/05/2013   Procedure: ROBOTIC LYMPHADENECTOMY;  Surgeon: Dutch Gray, MD;  Location: [facility];  Service: Urology;  Laterality: Left;  . PROSTATE BIOPSY     11/12 positive biopsies  . ROTATOR CUFF REPAIR  2012    Family History  Problem Relation Age of Onset  . Arthritis Mother   . Diabetes Mother   . Hypertension Mother   . Hyperlipidemia Mother   . Hypertension Father   . Prostate cancer Father        radiation in mid 2s  . Heart attack Father   . Cancer Sister        precancerous colon polyps, 6 in colon removed  . Stroke Maternal Grandfather     Social History   Tobacco Use  . Smoking status: Former Smoker    Packs/day: 0.50    Years: 15.00    Pack years: 7.50    Types: Cigarettes  . Smokeless tobacco: Never Used  Substance Use Topics  . Alcohol use: Yes    Alcohol/week: 2.0 standard drinks    Types: 2 Glasses of wine per week    Comment: wine  1  glasses daily    Subjective:  Patient presents to follow-up on bronchitis- 5 day planned follow-up; complaining of very little improvement with current regimen; feels like albuterol is not strong enough- feels like he could use it more than 3 times per day; has tapered down to 20 mg of Prednisone; no fever, no chest pain; Did have kyphoplasty earlier this week- back is feeling better. Had reassuring CXR done on Monday- no acute changes were seen.       Objective:  Vitals:   08/19/18 0908  BP: 138/90  Pulse: 97  Temp: 98.4 F (36.9 C)  TempSrc: Oral  SpO2: 97%  Weight: 189 lb 3.2 oz (85.8 kg)  Height: '5\' 8"'$  (1.727 m)    General: Well developed, well nourished, in no acute distress  Skin : Warm and dry.  Head: Normocephalic and atraumatic  Eyes: Sclera and conjunctiva clear; pupils round and reactive to light; extraocular movements intact  Ears: External normal; canals  clear; tympanic membranes normal  Oropharynx: Pink, supple. No suspicious lesions  Neck: Supple without thyromegaly, adenopathy  Lungs: Respirations unlabored; clear to auscultation bilaterally without wheeze, rales, rhonchi  CVS exam: normal rate and regular rhythm.  Neurologic: Alert and oriented; speech intact; face symmetrical; moves all extremities well; CNII-XII intact without focal deficit   Assessment:  1. Mild  intermittent asthma with acute exacerbation     Plan:  Complete antibiotic as prescribed; will change to stronger prednisone taper over the next 6 days; trial of Duo-neb as opposed to albuterol neb- can doe every 4 hours as needed; increase fluids, rest and follow-up worse, no better.   No follow-ups on file.  No orders of the defined types were placed in this encounter.   Requested Prescriptions   Signed Prescriptions Disp Refills  . predniSONE (STERAPRED UNI-PAK 21 TAB) 10 MG (21) TBPK tablet 21 tablet 0    Sig: Taper as directed  . ipratropium-albuterol (DUONEB) 0.5-2.5 (3) MG/3ML SOLN 360 mL 1    Sig: Take 3 mLs by nebulization every 4 (four) hours as needed.

## 2018-08-23 ENCOUNTER — Other Ambulatory Visit: Payer: Self-pay | Admitting: Interventional Radiology

## 2018-08-23 DIAGNOSIS — S22000A Wedge compression fracture of unspecified thoracic vertebra, initial encounter for closed fracture: Secondary | ICD-10-CM

## 2018-08-24 ENCOUNTER — Encounter: Payer: Self-pay | Admitting: Radiology

## 2018-08-24 ENCOUNTER — Ambulatory Visit
Admission: RE | Admit: 2018-08-24 | Discharge: 2018-08-24 | Disposition: A | Discharge status: Home / Self Care | Payer: BLUE CROSS/BLUE SHIELD | Source: Ambulatory Visit | Attending: Interventional Radiology | Admitting: Interventional Radiology

## 2018-08-24 DIAGNOSIS — S22000A Wedge compression fracture of unspecified thoracic vertebra, initial encounter for closed fracture: Secondary | ICD-10-CM

## 2018-08-24 HISTORY — PX: IR RADIOLOGIST EVAL & MGMT: IMG5224

## 2018-08-24 NOTE — Progress Notes (Signed)
Chief Complaint: Patient was seen in consultation today for back pain at the request of Hoss,Arthur  Referring Physician(s): Hoss,Arthur  History of Present Illness: [name] Holder is a 63 y.o. male with prostate cancer, back pain and vertebral body compression fractures.  Patient had an MRI on 07/18/2018 that demonstrated new bone lesions and concerning for osseous metastasis.  Pathologic fractures at T10 and T12 were noted.  There were additional compression fractures at L1 and L2.  Patient underwent kyphoplasty and biopsies at T10 and T12 on 08/17/2018.  The procedures were uncomplicated and the patient did very well for approximately 2 to 3 days after the procedure.  Overall, his back pain has improved since he had kyphoplasty at T10 and T12.  However, he is starting to have new pain in the lower spine, below the treated levels.  Patient is able to ambulate but he is very uncomfortable.  Patient is scheduled for urologic procedure next Monday with Dr. Alinda Money.  Patient denies fevers or chills since the procedure.  No concern for wound complication at the kyphoplasty sites.  Past Medical History:  Diagnosis Date  . Allergy   . Asthma   . Elevated PSA 10/03/2012   23.37  . History of kidney stones   . History of kidney stones   . Hx of colonic polyps   . Hypertension   . Osteopenia   . Prostate cancer [facility])     Past Surgical History:  Procedure Laterality Date  . COLONOSCOPY    . HERNIA REPAIR  ?2008  . IR KYPHO EA ADDL LEVEL THORACIC OR LUMBAR  08/17/2018  . IR KYPHO THORACIC WITH BONE BIOPSY  08/17/2018  . IR RADIOLOGIST EVAL & MGMT  08/11/2018  . IR RADIOLOGIST EVAL & MGMT  08/24/2018  . LYMPHADENECTOMY Left 01/05/2013   Procedure: ROBOTIC LYMPHADENECTOMY;  Surgeon: Dutch Gray, MD;  Location: [facility];  Service: Urology;  Laterality: Left;  . PROSTATE BIOPSY     11/12 positive biopsies  . ROTATOR CUFF REPAIR  2012    Allergies: Amlodipine and  Tetracycline  Medications: Prior to Admission medications   Medication Sig Start Date End Date Taking? Authorizing Provider  abiraterone acetate (ZYTIGA) 250 MG tablet Take 1,000 mg by mouth daily.  08/17/17   [provider]  albuterol (PROAIR HFA) 108 (90 Base) MCG/ACT inhaler Inhale 2 puffs into the lungs every 6 (six) hours as needed for wheezing or shortness of breath. 08/10/18   Caccavale, Sophia, PA-C  albuterol (PROVENTIL) (2.5 MG/3ML) 0.083% nebulizer solution Take 3 mLs (2.5 mg total) by nebulization every 6 (six) hours as needed for wheezing or shortness of breath. 08/15/18   Marrian Salvage, FNP  azelastine (ASTELIN) 0.1 % nasal spray Place 2 sprays into both nostrils every morning. Patient taking differently: Place 2 sprays into both nostrils daily as needed for allergies.  04/12/18   Murlean Iba, MD  azithromycin (ZITHROMAX) 250 MG tablet 2 tabs po qd x 1 day; 1 tablet per day x 4 days; 08/15/18   Marrian Salvage, FNP  CALCIUM-VITAMIN D PO Take 1 tablet by mouth daily.     [provider]  carvedilol (COREG) 3.125 MG tablet Take 1 tablet (3.125 mg total) by mouth 2 (two) times daily with a meal. 06/09/18   [name], MD  cyanocobalamin (,VITAMIN B-12,) 1000 MCG/ML injection Inject 1,000 mcg into the muscle every 30 (thirty) days.  05/14/18   [provider]  cyclobenzaprine (FLEXERIL) 5 MG  tablet Take 5 mg by mouth 3 (three) times daily as needed for muscle spasms.  05/16/18   [provider]  docusate sodium (COLACE) 100 MG capsule Take 100 mg by mouth daily.     [provider]  doxepin (SINEQUAN) 10 MG capsule Take 10 mg by mouth at bedtime.  08/02/18   [provider]  fentaNYL (DURAGESIC - DOSED MCG/HR) 25 MCG/HR patch Place 1 patch (25 mcg total) onto the skin every 3 (three) days. 08/05/18   Wyatt Portela, MD  ibuprofen (ADVIL,MOTRIN) 200 MG tablet Take 400 mg by mouth 2 (two) times daily.     [provider]  ipratropium-albuterol (DUONEB) 0.5-2.5 (3) MG/3ML SOLN Take 3 mLs by nebulization every 4 (four) hours as needed. 08/19/18   Marrian Salvage, FNP  Leuprolide Acetate (LUPRON IJ) Inject 1 Dose as directed every 4 (four) months. This is administered by [facility]    [provider]  methocarbamol (ROBAXIN) 500 MG tablet Take 500 mg by mouth at bedtime as needed for muscle spasms.    [provider]  mometasone (NASONEX) 50 MCG/ACT nasal spray USE 1 TO 2 SPRAYS IN EACH NOSTRIL ONCE DAILY. Patient taking differently: Place 2 sprays into the nose daily as needed (allergies).  09/16/17   [name], MD  NARCAN 4 MG/0.1ML LIQD nasal spray kit  08/18/18   [provider]  olmesartan (BENICAR) 40 MG tablet TAKE 1 TABLET DAILY 08/08/18   [name], MD  oxyCODONE (OXY IR/ROXICODONE) 5 MG immediate release tablet Take 1 tablet (5 mg total) by mouth every 4 (four) hours as needed for severe pain (may take 1-2 tablets every 4 hours). 08/18/18   Wyatt Portela, MD  predniSONE (STERAPRED UNI-PAK 21 TAB) 10 MG (21) TBPK tablet Taper as directed 08/19/18   Marrian Salvage, FNP  SYMBICORT 160-4.5 MCG/ACT inhaler Inhale 2 puffs into the lungs 2 (two) times daily. 08/19/18   [name], MD  SYRINGE-NEEDLE, DISP, 3 ML 3 ML MISC Use to inject b12 monthly. 04/19/18   [name], MD     Family History  Problem Relation Age of Onset  . Arthritis Mother   . Diabetes Mother   . Hypertension Mother   . Hyperlipidemia Mother   . Hypertension Father   . Prostate cancer Father        radiation in mid 2s  . Heart attack Father   . Cancer Sister        precancerous colon polyps, 6 in colon removed  . Stroke Maternal Grandfather     Social History   Socioeconomic History  . Marital status: Married    Spouse name: Not on file  . Number of children: Not on file  . Years of education: 35  . Highest education level: Not on file  Occupational  History  . 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 Financial resource strain: Not on file  . Food insecurity:    Worry: Not on file    Inability: Not on file  . Transportation needs:    Medical: Not on file    Non-medical: Not on file  Tobacco Use  . Smoking status: Former Smoker    Packs/day: 0.50    Years: 15.00    Pack years: 7.50    Types: Cigarettes  . Smokeless tobacco: Never Used  Substance and Sexual Activity  . Alcohol  use: Yes    Alcohol/week: 2.0 standard drinks    Types: 2 Glasses of wine per week    Comment: wine  1  glasses daily  . Drug use: No  . Sexual activity: Not Currently  Lifestyle  . Physical activity:    Days per week: Not on file    Minutes per session: Not on file  . Stress: Not on file  Relationships  . Social connections:    Talks on phone: Not on file    Gets together: Not on file    Attends religious service: Not on file    Active member of club or organization: Not on file    Attends meetings of clubs or organizations: Not on file    Relationship status: Not on file  Other Topics Concern  . Not on file  Social History Narrative   Regular exercise-yes   Caffeine Use-yes     Review of Systems  Constitutional: Negative for chills and fever.  Musculoskeletal: Positive for back pain.    Vital Signs: BP (!) 152/84   Pulse 93   Temp 98.5 F (36.9 C) (Oral)   Resp 15   SpO2 98%   Physical Exam Musculoskeletal:     Comments: The kyphoplasty needle sites are healing.  No erythema or drainage.  Minimal tenderness in the lower thoracic spine.  Focal tenderness along the spinous process in the upper and mid lumbar spine, below the previous kyphoplasty levels.  Neurological:     Mental Status: He is alert.        Imaging: Dg Chest 2 View  Result Date: 08/15/2018 CLINICAL DATA:  Cough and shortness of breath. Follow-up pneumonia. History of metastatic prostate cancer.  EXAM: CHEST - 2 VIEW COMPARISON:  Chest radiographs and CTA 08/10/2018 FINDINGS: The cardiomediastinal silhouette is unchanged with normal heart size. Curvilinear opacities in the lingula and basilar left lower lobe are similar to the prior studies. No acute airspace consolidation, edema, pleural effusion, pneumothorax is identified. Vertebral fractures are again seen in the lower thoracic and lumbar spine. IMPRESSION: Chronic atelectasis or scarring in the left lung base. No evidence of active cardiopulmonary disease. Electronically Signed   By: Logan Bores M.D.   On: 08/15/2018 11:29   Dg Chest 2 View  Result Date: 08/10/2018 CLINICAL DATA:  Acute asthma exacerbation with approximate 2 day history of increasing shortness of breath. Current history of prostate cancer metastatic to bone treated with hormonal therapy. EXAM: CHEST - 2 VIEW COMPARISON:  CT chest 07/28/2018. Chest x-ray 01/17/2014 and earlier. FINDINGS: Cardiac silhouette normal in size, unchanged. Thoracic aorta mildly tortuous. Hilar and mediastinal contours otherwise unremarkable. Chronic scarring involving the lingula and LEFT LOWER LOBE. Focally prominent bronchovascular markings and central peribronchial thickening associated with streaky opacities in the LEFT LOWER LOBE. Lungs otherwise clear. No localized airspace consolidation. No pleural effusions. No pneumothorax. No pulmonary parenchymal nodules. Normal pulmonary vascularity. Apparent opacity projected over the INFERIOR LEFT UPPER LOBE laterally was shown on the recent CT to represent callus related to a healing ANTERIOR LEFT third rib fracture. Healed LEFT POSTERIOR third and fourth rib fractures are also present. Previously identified pathologic compression fractures of T10 and T12. IMPRESSION: Acute bronchopneumonia involving the LEFT LOWER LOBE superimposed upon chronic scarring in the lingula and LEFT LOWER LOBE. Electronically Signed   By: Evangeline Dakin M.D.   On: 08/10/2018  12:09   Ct Angio Chest Pe W/cm &/or Wo Cm  Result Date: 08/10/2018 CLINICAL DATA:  Shortness of  breath.  Metastatic prostate cancer. EXAM: CT ANGIOGRAPHY CHEST WITH CONTRAST TECHNIQUE: Multidetector CT imaging of the chest was performed using the standard protocol during bolus administration of intravenous contrast. Multiplanar CT image reconstructions and MIPs were obtained to evaluate the vascular anatomy. CONTRAST:  158m ISOVUE-370 IOPAMIDOL (ISOVUE-370) INJECTION 76% COMPARISON:  Chest x-ray 08/10/2018 and CT scan dated 07/28/2018 FINDINGS: Cardiovascular: Satisfactory opacification of the pulmonary arteries to the segmental level. No evidence of pulmonary embolism. Normal heart size. No pericardial effusion. Mediastinum/Nodes: No enlarged mediastinal, hilar, or axillary lymph nodes. Thyroid gland, trachea, and esophagus demonstrate no significant findings. Lungs/Pleura: There is minimal linear atelectasis at the lung bases. No infiltrates or effusions. Upper Abdomen: Negative. Musculoskeletal: There is a severe compression fracture of T10 in the less severe compression fracture of T12, slightly progressed at T10. No acute bone abnormality. The patient has known metastatic prostate cancer. Review of the MIP images confirms the above findings. IMPRESSION: 1. No pulmonary emboli or other acute abnormalities other than slight atelectasis at the lung bases. 2. Progressive pathologic fracture of T10. No visible tumor in the spinal canal. No visible progression of the pathologic fracture of T12. Electronically Signed   By: JLorriane ShireM.D.   On: 08/10/2018 14:58   Ir Kypho Thoracic With Bone Biopsy  Result Date: 08/17/2018 INDICATION: T10 and T12 compression fractures EXAM: T10 AND T12 KYPHOPLASTY AND BONE BIOPSY COMPARISON:  07/18/2018 MRI MEDICATIONS: As antibiotic prophylaxis, Ancef 2 g was ordered pre-procedure and administered intravenously within 1 hour of incision. ANESTHESIA/SEDATION: Moderate  (conscious) sedation was employed during this procedure. A total of Versed 4 mg and Fentanyl 200 mcg was administered intravenously. Moderate Sedation Time: 45 minutes. The patient's level of consciousness and vital signs were monitored continuously by radiology nursing throughout the procedure under my direct supervision. FLUOROSCOPY TIME:  Fluoroscopy Time: 11 minutes 30 seconds (1102mGy) COMPLICATIONS: None immediate. PROCEDURE: Following a full explanation of the procedure along with the potential associated complications, an informed witnessed consent was obtained. The back was prepped and draped in a sterile fashion. Maximal barrier technique was utilized. 1% lidocaine was utilized for local anesthesia. Under fluoroscopic guidance, a spinal needle was advanced to the left T10 pedicle. 1% lidocaine was infiltrated. The 10 gauge expressed needle was then advanced through the left T10 pedicle into the vertebral body. A bone drill was advanced into the vertebral body. Bone fragments were sent for biopsy. The 15 2 balloon was then inserted. The identical procedure was performed at T12 via the right pedicle. Again, a 15 2 balloon was inserted. Both balloons were insufflated to 200 pounds per square inch. The balloons were removed. The cement deposition trocar was inserted into each cannula at T10 and T12. Cement was then injected filling the vertebral body with without significant extraosseous cement. Trace cement entered a prevertebral vein at T12. It was stable without embolizing. The trocars were removed. Hemostasis was achieved with direct pressure. IMPRESSION: Successful T10 and T12 kyphoplasty and bone biopsy. Electronically Signed   By: AMarybelle KillingsM.D.   On: 08/17/2018 15:15   Ir Kypho Ea Addl Level Thoracic Or Lumbar  Result Date: 08/17/2018 INDICATION: T10 and T12 compression fractures EXAM: T10 AND T12 KYPHOPLASTY AND BONE BIOPSY COMPARISON:  07/18/2018 MRI MEDICATIONS: As antibiotic prophylaxis,  Ancef 2 g was ordered pre-procedure and administered intravenously within 1 hour of incision. ANESTHESIA/SEDATION: Moderate (conscious) sedation was employed during this procedure. A total of Versed 4 mg and Fentanyl 200 mcg was administered intravenously. Moderate  Sedation Time: 45 minutes. The patient's level of consciousness and vital signs were monitored continuously by radiology nursing throughout the procedure under my direct supervision. FLUOROSCOPY TIME:  Fluoroscopy Time: 11 minutes 30 seconds (992 mGy) COMPLICATIONS: None immediate. PROCEDURE: Following a full explanation of the procedure along with the potential associated complications, an informed witnessed consent was obtained. The back was prepped and draped in a sterile fashion. Maximal barrier technique was utilized. 1% lidocaine was utilized for local anesthesia. Under fluoroscopic guidance, a spinal needle was advanced to the left T10 pedicle. 1% lidocaine was infiltrated. The 10 gauge expressed needle was then advanced through the left T10 pedicle into the vertebral body. A bone drill was advanced into the vertebral body. Bone fragments were sent for biopsy. The 15 2 balloon was then inserted. The identical procedure was performed at T12 via the right pedicle. Again, a 15 2 balloon was inserted. Both balloons were insufflated to 200 pounds per square inch. The balloons were removed. The cement deposition trocar was inserted into each cannula at T10 and T12. Cement was then injected filling the vertebral body with without significant extraosseous cement. Trace cement entered a prevertebral vein at T12. It was stable without embolizing. The trocars were removed. Hemostasis was achieved with direct pressure. IMPRESSION: Successful T10 and T12 kyphoplasty and bone biopsy. Electronically Signed   By: Marybelle Killings M.D.   On: 08/17/2018 15:15   Ir Radiologist Eval & Mgmt  Result Date: 08/24/2018 Please refer to notes tab for details about  interventional procedure. (Op Note)  Ir Radiologist Eval & Mgmt  Result Date: 08/11/2018 Please refer to notes tab for details about interventional procedure. (Op Note)   Labs:  CBC: Recent Labs    04/11/18 0543 04/12/18 0533 08/10/18 1359 08/10/18 1409 08/17/18 1121  WBC 5.4 4.1 7.6  --  8.5  HGB 11.0* 11.3* 11.4* 11.2* 12.2*  HCT 32.2* 33.0* 34.1* 33.0* 35.5*  PLT 158 138* 221  --  276    COAGS: Recent Labs    08/17/18 1121  INR 0.95    BMP: Recent Labs    09/16/17 1056 04/10/18 2343 04/11/18 0543 04/12/18 0533 07/18/18 1546 08/10/18 1359 08/10/18 1409  NA 137 134*  --  136  --  136 135  K 4.2 3.8  --  3.9  --  3.7 3.7  CL 103 103  --  106  --  101 100  CO2 28 19*  --  27  --  24  --   GLUCOSE 108* 97  --  108*  --  116* 112*  BUN 17 15  --  11  --  12 12  CALCIUM 9.7 9.3  --  9.0  --  9.5  --   CREATININE 0.91 0.79 0.74 0.81 0.70 0.74 0.60*  GFRNONAA  --  >60 >60 >60  --  >60  --   GFRAA  --  >60 >60 >60  --  >60  --     LIVER FUNCTION TESTS: Recent Labs    09/02/17 0915 09/16/17 1056 04/10/18 2343 08/10/18 1359  BILITOT 1.0 1.2 0.8 0.7  AST 149* 49* 21 18  ALT 274* 107* 20 22  ALKPHOS 52 49 39 93  PROT 6.8 6.5 7.0 6.1*  ALBUMIN 4.3 4.0 4.2 3.9    TUMOR MARKERS: No results for input(s): AFPTM, CEA, CA199, CHROMGRNA in the last 8760 hours.  Assessment and Plan:  63 year old with prostate cancer, back pain and multiple vertebral body compression fractures.  Recent bone biopsies were negative for carcinoma.  Overall, the patient's back pain has improved since the kyphoplasty at T10 and T12.  Patient was doing very well until approximately 3 days after the procedure and then started noticing new pain below the treated levels.  The patient's pain localizes below the previous treated levels and this is appears to be in the upper lumbar spine.  Patient clearly has compression fractures at L1 and L2 that are new from an MRI on 04/11/2018.  Suspect the  pain is associated with the compression fractures at L1 and L2.  Patient's exam findings were discussed with Dr. Barbie Banner.  Explained to the patient that his symptoms are likely related to the compression fractures at L1 and L2 and recommend kyphoplasty and possible biopsy at L1 and L2.  We will try to schedule these procedures to be done as soon as possible.  Thank you for this interesting consult.  I greatly enjoyed meeting [name] Holder and look forward to participating in their care.  A copy of this report was sent to the requesting provider on this date.  Electronically Signed: Burman Riis 08/24/2018, 5:16 PM   I spent a total of    15 Minutes in face to face in clinical consultation, greater than 50% of which was counseling/coordinating care for back pain and compression fractures.  Patient ID: [name] Holder, male   DOB: Aug 21, 1955, 63 y.o.   MRN: [id number]

## 2018-08-25 ENCOUNTER — Other Ambulatory Visit (HOSPITAL_COMMUNITY): Payer: Self-pay | Admitting: Diagnostic Radiology

## 2018-08-25 ENCOUNTER — Other Ambulatory Visit (HOSPITAL_COMMUNITY): Payer: Self-pay | Admitting: Interventional Radiology

## 2018-08-25 DIAGNOSIS — S32010A Wedge compression fracture of first lumbar vertebra, initial encounter for closed fracture: Secondary | ICD-10-CM

## 2018-08-25 DIAGNOSIS — S32020A Wedge compression fracture of second lumbar vertebra, initial encounter for closed fracture: Secondary | ICD-10-CM

## 2018-08-25 NOTE — Progress Notes (Signed)
08-10-18 (Epic) EKG, CXR

## 2018-08-25 NOTE — Patient Instructions (Signed)
[name] Holder  08/25/2018   Your procedure is scheduled on: 08-29-18    Report to [name] Holder Main  Entrance    Report to Admitting at 1:30 PM    Call this number if you have problems the morning of [name] [phone]    Remember: Do not eat food or drink liquids :After Midnight.    BRUSH YOUR TEETH MORNING OF [name] AND RINSE YOUR MOUTH OUT, NO CHEWING GUM CANDY OR MINTS.     Take these medicines the morning of [name] with A SIP OF WATER: Carvedilol (Coreg). Zytiga. You may also use your inhaler and nasal spray as needed.                                 You may not have any metal on your body including hair pins and              piercings  Do not wear jewelry, cologne, lotions, powders or deodorant             Men may shave face and neck.   Do not bring valuables to the [facility]. [name].  Contacts, dentures or bridgework may not be worn into [name].       Patients discharged the day of [name] will not be allowed to drive home. IF YOU ARE HAVING [name] AND GOING HOME THE SAME DAY, YOU MUST HAVE AN ADULT TO DRIVE YOU HOME AND BE WITH YOU FOR 24 HOURS. YOU MAY GO HOME BY TAXI OR UBER OR ORTHERWISE, BUT AN ADULT MUST ACCOMPANY YOU HOME AND STAY WITH YOU FOR 24 HOURS.  Name and phone number of your driver:  Special Instructions: N/A              Please read over the following fact sheets you were given: _____________________________________________________________________             [name] Holder - Preparing for [name] Before [name], you can play an important role.  Because skin is not sterile, your skin needs to be as free of germs as possible.  You can reduce the number of germs on your skin by washing with CHG (chlorahexidine gluconate) soap before [name].  CHG is an antiseptic cleaner which kills germs and bonds with the skin to continue killing germs even after washing. Please DO NOT use  if you have an allergy to CHG or antibacterial soaps.  If your skin becomes reddened/irritated stop using the CHG and inform your nurse when you arrive at Short Stay. Do not shave (including legs and underarms) for at least 48 hours prior to the first CHG shower.  You may shave your face/neck. Please follow these instructions carefully:  1.  Shower with CHG Soap the night before [name] and the  morning of [name].  2.  If you choose to wash your hair, wash your hair first as usual with your  normal  shampoo.  3.  After you shampoo, rinse your hair and body thoroughly to remove the  shampoo.                           4.  Use CHG as you would any other liquid soap.  You can apply chg directly  to the skin and wash                       Gently with a scrungie or clean washcloth.  5.  Apply the CHG Soap to your body ONLY FROM THE NECK DOWN.   Do not use on face/ open                           Wound or open sores. Avoid contact with eyes, ears mouth and genitals (private parts).                       Wash face,  Genitals (private parts) with your normal soap.             6.  Wash thoroughly, paying special attention to the area where your [name]  will be performed.  7.  Thoroughly rinse your body with warm water from the neck down.  8.  DO NOT shower/wash with your normal soap after using and rinsing off  the CHG Soap.                9.  Pat yourself dry with a clean towel.            10.  Wear clean pajamas.            11.  Place clean sheets on your bed the night of your first shower and do not  sleep with pets. Day of [name] : Do not apply any lotions/deodorants the morning of [name].  Please wear clean clothes to the [facility]/[name] center.  FAILURE TO FOLLOW THESE INSTRUCTIONS MAY RESULT IN THE CANCELLATION OF YOUR [name] PATIENT SIGNATURE_________________________________  NURSE  SIGNATURE__________________________________  ________________________________________________________________________

## 2018-08-26 ENCOUNTER — Ambulatory Visit: Payer: BLUE CROSS/BLUE SHIELD | Admitting: Family

## 2018-08-26 ENCOUNTER — Encounter (HOSPITAL_COMMUNITY)
Admission: RE | Admit: 2018-08-26 | Discharge: 2018-08-26 | Disposition: A | Discharge status: Home / Self Care | Payer: BLUE CROSS/BLUE SHIELD | Source: Ambulatory Visit | Attending: Urology | Admitting: Urology

## 2018-08-26 ENCOUNTER — Ambulatory Visit (HOSPITAL_COMMUNITY)
Admission: RE | Admit: 2018-08-26 | Discharge: 2018-08-26 | Disposition: A | Discharge status: Home / Self Care | Payer: BLUE CROSS/BLUE SHIELD | Source: Ambulatory Visit | Attending: Diagnostic Radiology | Admitting: Diagnostic Radiology

## 2018-08-26 ENCOUNTER — Other Ambulatory Visit (HOSPITAL_COMMUNITY): Payer: Self-pay | Admitting: Diagnostic Radiology

## 2018-08-26 ENCOUNTER — Encounter (HOSPITAL_COMMUNITY): Payer: Self-pay

## 2018-08-26 ENCOUNTER — Other Ambulatory Visit: Payer: Self-pay | Admitting: Radiology

## 2018-08-26 DIAGNOSIS — M8580 Other specified disorders of bone density and structure, unspecified site: Secondary | ICD-10-CM | POA: Insufficient documentation

## 2018-08-26 DIAGNOSIS — Z888 Allergy status to other drugs, medicaments and biological substances status: Secondary | ICD-10-CM | POA: Insufficient documentation

## 2018-08-26 DIAGNOSIS — J45909 Unspecified asthma, uncomplicated: Secondary | ICD-10-CM | POA: Diagnosis not present

## 2018-08-26 DIAGNOSIS — X58XXXA Exposure to other specified factors, initial encounter: Secondary | ICD-10-CM | POA: Insufficient documentation

## 2018-08-26 DIAGNOSIS — Z8249 Family history of ischemic heart disease and other diseases of the circulatory system: Secondary | ICD-10-CM | POA: Insufficient documentation

## 2018-08-26 DIAGNOSIS — S32020A Wedge compression fracture of second lumbar vertebra, initial encounter for closed fracture: Secondary | ICD-10-CM | POA: Diagnosis not present

## 2018-08-26 DIAGNOSIS — Z79899 Other long term (current) drug therapy: Secondary | ICD-10-CM | POA: Insufficient documentation

## 2018-08-26 DIAGNOSIS — Z823 Family history of stroke: Secondary | ICD-10-CM | POA: Insufficient documentation

## 2018-08-26 DIAGNOSIS — Z881 Allergy status to other antibiotic agents status: Secondary | ICD-10-CM | POA: Insufficient documentation

## 2018-08-26 DIAGNOSIS — Z87891 Personal history of nicotine dependence: Secondary | ICD-10-CM | POA: Diagnosis not present

## 2018-08-26 DIAGNOSIS — M899 Disorder of bone, unspecified: Secondary | ICD-10-CM | POA: Diagnosis not present

## 2018-08-26 DIAGNOSIS — S32010A Wedge compression fracture of first lumbar vertebra, initial encounter for closed fracture: Secondary | ICD-10-CM | POA: Insufficient documentation

## 2018-08-26 DIAGNOSIS — I10 Essential (primary) hypertension: Secondary | ICD-10-CM | POA: Insufficient documentation

## 2018-08-26 DIAGNOSIS — Z7951 Long term (current) use of inhaled steroids: Secondary | ICD-10-CM | POA: Diagnosis not present

## 2018-08-26 HISTORY — PX: IR KYPHO LUMBAR INC FX REDUCE BONE BX UNI/BIL CANNULATION INC/IMAGING: IMG5519

## 2018-08-26 HISTORY — PX: IR KYPHO EA ADDL LEVEL THORACIC OR LUMBAR: IMG5520

## 2018-08-26 LAB — CBC
HCT: 35.3 % — ABNORMAL LOW (ref 39.0–52.0)
Hemoglobin: 11.8 g/dL — ABNORMAL LOW (ref 13.0–17.0)
MCH: 31.9 pg (ref 26.0–34.0)
MCHC: 33.4 g/dL (ref 30.0–36.0)
MCV: 95.4 fL (ref 80.0–100.0)
Platelets: 242 10*3/uL (ref 150–400)
RBC: 3.7 MIL/uL — ABNORMAL LOW (ref 4.22–5.81)
RDW: 12 % (ref 11.5–15.5)
WBC: 7.5 10*3/uL (ref 4.0–10.5)
nRBC: 0 % (ref 0.0–0.2)

## 2018-08-26 LAB — BASIC METABOLIC PANEL
Anion gap: 9 (ref 5–15)
BUN: 12 mg/dL (ref 8–23)
CO2: 22 mmol/L (ref 22–32)
Calcium: 9.5 mg/dL (ref 8.9–10.3)
Chloride: 104 mmol/L (ref 98–111)
Creatinine, Ser: 0.76 mg/dL (ref 0.61–1.24)
GFR calc Af Amer: >60 mL/min (ref >60)
GFR calc non Af Amer: >60 mL/min (ref >60)
Glucose, Bld: 106 mg/dL — ABNORMAL HIGH (ref 70–99)
Potassium: 3.4 mmol/L — ABNORMAL LOW (ref 3.5–5.1)
Sodium: 135 mmol/L (ref 135–145)

## 2018-08-26 LAB — PROTIME-INR
INR: 0.94
Prothrombin Time: 12.5 seconds (ref 11.4–15.2)

## 2018-08-26 MED ORDER — SODIUM CHLORIDE 0.9 % IV SOLN
INTRAVENOUS | Status: DC
  Administered 2018-08-26: 09:00:00 via INTRAVENOUS

## 2018-08-26 MED ORDER — FENTANYL CITRATE (PF) 100 MCG/2ML IJ SOLN
INTRAMUSCULAR | Status: AC | PRN
  Administered 2018-08-26: 50 ug via INTRAVENOUS
  Administered 2018-08-26: 25 ug via INTRAVENOUS
  Administered 2018-08-26: 50 ug via INTRAVENOUS
  Administered 2018-08-26: 25 ug via INTRAVENOUS

## 2018-08-26 MED ORDER — IOPAMIDOL (ISOVUE-300) INJECTION 61%
INTRAVENOUS | Status: AC
  Administered 2018-08-26: 20 mL
  Filled 2018-08-26: qty 50

## 2018-08-26 MED ORDER — FENTANYL CITRATE (PF) 100 MCG/2ML IJ SOLN
INTRAMUSCULAR | Status: AC
  Filled 2018-08-26: qty 4

## 2018-08-26 MED ORDER — LIDOCAINE HCL 1 % IJ SOLN
INTRAMUSCULAR | Status: AC | PRN
  Administered 2018-08-26: 20 mL

## 2018-08-26 MED ORDER — MIDAZOLAM HCL 2 MG/2ML IJ SOLN
INTRAMUSCULAR | Status: AC
  Filled 2018-08-26: qty 4

## 2018-08-26 MED ORDER — CEFAZOLIN SODIUM-DEXTROSE 2-4 GM/100ML-% IV SOLN
INTRAVENOUS | Status: AC
  Filled 2018-08-26: qty 100

## 2018-08-26 MED ORDER — LIDOCAINE HCL (PF) 1 % IJ SOLN
INTRAMUSCULAR | Status: AC
  Filled 2018-08-26: qty 30

## 2018-08-26 MED ORDER — CEFAZOLIN SODIUM-DEXTROSE 2-4 GM/100ML-% IV SOLN
2.0000 g | INTRAVENOUS | Status: AC
  Administered 2018-08-26: 2 g via INTRAVENOUS

## 2018-08-26 MED ORDER — IPRATROPIUM-ALBUTEROL 0.5-2.5 (3) MG/3ML IN SOLN
3.0000 mL | Freq: Once | RESPIRATORY_TRACT | Status: AC
  Administered 2018-08-26: 3 mL via RESPIRATORY_TRACT
  Filled 2018-08-26: qty 3

## 2018-08-26 MED ORDER — MIDAZOLAM HCL 2 MG/2ML IJ SOLN
INTRAMUSCULAR | Status: AC | PRN
  Administered 2018-08-26: 0.5 mg via INTRAVENOUS
  Administered 2018-08-26: 1 mg via INTRAVENOUS
  Administered 2018-08-26 (×2): 0.5 mg via INTRAVENOUS
  Administered 2018-08-26: 1 mg via INTRAVENOUS

## 2018-08-26 NOTE — H&P (Signed)
Chief Complaint: Patient was seen in consultation today for Lumbar 1-2 Kyphoplasty   Referring Physician(s): Dr Dahlia Byes  Supervising Physician: Marybelle Killings  Patient Status: [facility] - Out-pt  History of Present Illness: [name] Holder is a 63 y.o. male   Hx Prostate cancer Developed back pain in Oct 2019 Worsened and was referred to Dr Barbie Banner for evaluation and possible treatment Was seen in consultation 08/11/18 Thoracic 10 and 12 KP was performed 08/17/18 Bx negative and had good pain relief for few days  Suspected L1 and L2 fractures were discussed with pt in same consult-- not treated given localization of pain at T10-12  Days after first KPs pain was lessened-- but new pain has been in lower back now and was seen again in [facility] by Dr Anselm Pancoast. 1/44 note:   63 year old with prostate cancer, back pain and multiple vertebral body compression fractures.  Recent bone biopsies were negative for carcinoma.  Overall, the patient's back pain has improved since the kyphoplasty at T10 and T12.  Patient was doing very well until approximately 3 days after the procedure and then started noticing new pain below the treated levels.  The patient's pain localizes below the previous treated levels and this is appears to be in the upper lumbar spine.  Patient clearly has compression fractures at L1 and L2 that are new from an MRI on 04/11/2018.  Suspect the pain is associated with the compression fractures at L1 and L2.  Patient's exam findings were discussed with Dr. Barbie Banner.  Explained to the patient that his symptoms are likely related to the compression fractures at L1 and L2 and recommend kyphoplasty and possible biopsy at L1 and L2.  We will try to schedule these procedures to be done as soon as possible.      Past Medical History:  Diagnosis Date  . Allergy   . Asthma   . Elevated PSA 10/03/2012   23.37  . History of kidney stones   . History of kidney stones   . Hx of colonic polyps   .  Hypertension   . Osteopenia   . Prostate cancer [facility])     Past Surgical History:  Procedure Laterality Date  . COLONOSCOPY    . HERNIA REPAIR  ?2008  . IR KYPHO EA ADDL LEVEL THORACIC OR LUMBAR  08/17/2018  . IR KYPHO THORACIC WITH BONE BIOPSY  08/17/2018  . IR RADIOLOGIST EVAL & MGMT  08/11/2018  . IR RADIOLOGIST EVAL & MGMT  08/24/2018  . LYMPHADENECTOMY Left 01/05/2013   Procedure: ROBOTIC LYMPHADENECTOMY;  Surgeon: Dutch Gray, MD;  Location: [facility] ORS;  Service: Urology;  Laterality: Left;  . PROSTATE BIOPSY     11/12 positive biopsies  . ROTATOR CUFF REPAIR  2012    Allergies: Amlodipine and Tetracycline  Medications: Prior to Admission medications   Medication Sig Start Date End Date Taking? Authorizing Provider  abiraterone acetate (ZYTIGA) 250 MG tablet Take 1,000 mg by mouth daily.  08/17/17  Yes [provider]  albuterol (PROAIR HFA) 108 (90 Base) MCG/ACT inhaler Inhale 2 puffs into the lungs every 6 (six) hours as needed for wheezing or shortness of breath. 08/10/18  Yes Caccavale, Sophia, PA-C  CALCIUM-VITAMIN D PO Take 1 tablet by mouth daily.    Yes [provider]  carvedilol (COREG) 3.125 MG tablet Take 1 tablet (3.125 mg total) by mouth 2 (two) times daily with a meal. 06/09/18  Yes [name], MD  cyanocobalamin (,VITAMIN B-12,) 1000  MCG/ML injection Inject 1,000 mcg into the muscle every 30 (thirty) days.  05/14/18  Yes [provider]  docusate sodium (COLACE) 100 MG capsule Take 100 mg by mouth daily.    Yes [provider]  ibuprofen (ADVIL,MOTRIN) 200 MG tablet Take 400 mg by mouth 2 (two) times daily.    Yes [provider]  ipratropium-albuterol (DUONEB) 0.5-2.5 (3) MG/3ML SOLN Take 3 mLs by nebulization every 4 (four) hours as needed. 08/19/18  Yes Marrian Salvage, FNP  methocarbamol (ROBAXIN) 500 MG tablet Take 500 mg by mouth at bedtime as needed for muscle spasms.   Yes [provider]  mometasone  (NASONEX) 50 MCG/ACT nasal spray USE 1 TO 2 SPRAYS IN EACH NOSTRIL ONCE DAILY. Patient taking differently: Place 2 sprays into the nose daily as needed (allergies).  09/16/17  Yes [name], MD  olmesartan (BENICAR) 40 MG tablet TAKE 1 TABLET DAILY 08/08/18  Yes [name], MD  oxyCODONE (OXY IR/ROXICODONE) 5 MG immediate release tablet Take 1 tablet (5 mg total) by mouth every 4 (four) hours as needed for severe pain (may take 1-2 tablets every 4 hours). 08/18/18  Yes Wyatt Portela, MD  predniSONE (STERAPRED UNI-PAK 21 TAB) 10 MG (21) TBPK tablet Taper as directed 08/19/18  Yes Marrian Salvage, FNP  SYMBICORT 160-4.5 MCG/ACT inhaler Inhale 2 puffs into the lungs 2 (two) times daily. 08/19/18  Yes [name], MD  albuterol (PROVENTIL) (2.5 MG/3ML) 0.083% nebulizer solution Take 3 mLs (2.5 mg total) by nebulization every 6 (six) hours as needed for wheezing or shortness of breath. 08/15/18   Marrian Salvage, FNP  azelastine (ASTELIN) 0.1 % nasal spray Place 2 sprays into both nostrils every morning. Patient taking differently: Place 2 sprays into both nostrils daily as needed for allergies.  04/12/18   Murlean Iba, MD  azithromycin (ZITHROMAX) 250 MG tablet 2 tabs po qd x 1 day; 1 tablet per day x 4 days; 08/15/18   Marrian Salvage, FNP  cyclobenzaprine (FLEXERIL) 5 MG tablet Take 5 mg by mouth 3 (three) times daily as needed for muscle spasms.  05/16/18   [provider]  doxepin (SINEQUAN) 10 MG capsule Take 10 mg by mouth at bedtime.  08/02/18   [provider]  fentaNYL (DURAGESIC - DOSED MCG/HR) 25 MCG/HR patch Place 1 patch (25 mcg total) onto the skin every 3 (three) days. 08/05/18   Wyatt Portela, MD  Leuprolide Acetate (LUPRON IJ) Inject 1 Dose as directed every 4 (four) months. This is administered by [facility]    [provider]  [facility] 4 MG/0.1ML LIQD nasal spray kit  08/18/18   [provider]  SYRINGE-NEEDLE,  DISP, 3 ML 3 ML MISC Use to inject b12 monthly. 04/19/18   [name], MD     Family History  Problem Relation Age of Onset  . Arthritis Mother   . Diabetes Mother   . Hypertension Mother   . Hyperlipidemia Mother   . Hypertension Father   . Prostate cancer Father        radiation in mid 48s  . Heart attack Father   . Cancer Sister        precancerous colon polyps, 6 in colon removed  . Stroke Maternal Grandfather     Social History   Socioeconomic History  . Marital status: Married    Spouse name: Not on file  . Number of children: Not on file  .  Years of education: 20  . Highest education level: Not on file  Occupational History  . 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 Financial resource strain: Not on file  . Food insecurity:    Worry: Not on file    Inability: Not on file  . Transportation needs:    Medical: Not on file    Non-medical: Not on file  Tobacco Use  . Smoking status: Former Smoker    Packs/day: 0.50    Years: 15.00    Pack years: 7.50    Types: Cigarettes  . Smokeless tobacco: Never Used  Substance and Sexual Activity  . Alcohol use: Yes    Alcohol/week: 2.0 standard drinks    Types: 2 Glasses of wine per week    Comment: wine  1  glasses daily  . Drug use: No  . Sexual activity: Not Currently  Lifestyle  . Physical activity:    Days per week: Not on file    Minutes per session: Not on file  . Stress: Not on file  Relationships  . Social connections:    Talks on phone: Not on file    Gets together: Not on file    Attends religious service: Not on file    Active member of club or organization: Not on file    Attends meetings of clubs or organizations: Not on file    Relationship status: Not on file  Other Topics Concern  . Not on file  Social History Narrative   Regular exercise-yes   Caffeine Use-yes    Review of Systems: A 12 point ROS discussed and pertinent  positives are indicated in the HPI above.  All other systems are negative.  Review of Systems  Constitutional: Positive for activity change. Negative for appetite change, fatigue and fever.  Respiratory: Negative for shortness of breath.   Cardiovascular: Negative for chest pain.  Gastrointestinal: Negative for abdominal pain.  Musculoskeletal: Positive for back pain and gait problem.  Neurological: Positive for weakness.  Psychiatric/Behavioral: Negative for behavioral problems and confusion.    Vital Signs: BP (!) 152/90   Pulse 86   Temp 98.8 F (37.1 C)   Resp 20   Ht '5\' 8"'$  (1.727 m)   Wt 197 lb (89.4 kg)   SpO2 100%   BMI 29.95 kg/m   Physical Exam Vitals signs reviewed.  Constitutional:      Appearance: Normal appearance.  Cardiovascular:     Rate and Rhythm: Normal rate and regular rhythm.     Heart sounds: Normal heart sounds.  Pulmonary:     Effort: Pulmonary effort is normal.     Breath sounds: Normal breath sounds.  Abdominal:     General: Bowel sounds are normal.     Palpations: Abdomen is soft.  Musculoskeletal: Normal range of motion.        General: Tenderness present.     Comments: Low back pain  Skin:    General: Skin is warm and dry.  Neurological:     General: No focal deficit present.     Mental Status: He is alert and oriented to person, place, and time.  Psychiatric:        Mood and Affect: Mood normal.        Behavior: Behavior normal.        Thought Content: Thought content normal.        Judgment: Judgment normal.  Imaging: Dg Chest 2 View  Result Date: 08/15/2018 CLINICAL DATA:  Cough and shortness of breath. Follow-up pneumonia. History of metastatic prostate cancer. EXAM: CHEST - 2 VIEW COMPARISON:  Chest radiographs and CTA 08/10/2018 FINDINGS: The cardiomediastinal silhouette is unchanged with normal heart size. Curvilinear opacities in the lingula and basilar left lower lobe are similar to the prior studies. No acute airspace  consolidation, edema, pleural effusion, pneumothorax is identified. Vertebral fractures are again seen in the lower thoracic and lumbar spine. IMPRESSION: Chronic atelectasis or scarring in the left lung base. No evidence of active cardiopulmonary disease. Electronically Signed   By: Logan Bores M.D.   On: 08/15/2018 11:29   Dg Chest 2 View  Result Date: 08/10/2018 CLINICAL DATA:  Acute asthma exacerbation with approximate 2 day history of increasing shortness of breath. Current history of prostate cancer metastatic to bone treated with hormonal therapy. EXAM: CHEST - 2 VIEW COMPARISON:  CT chest 07/28/2018. Chest x-ray 01/17/2014 and earlier. FINDINGS: Cardiac silhouette normal in size, unchanged. Thoracic aorta mildly tortuous. Hilar and mediastinal contours otherwise unremarkable. Chronic scarring involving the lingula and LEFT LOWER LOBE. Focally prominent bronchovascular markings and central peribronchial thickening associated with streaky opacities in the LEFT LOWER LOBE. Lungs otherwise clear. No localized airspace consolidation. No pleural effusions. No pneumothorax. No pulmonary parenchymal nodules. Normal pulmonary vascularity. Apparent opacity projected over the INFERIOR LEFT UPPER LOBE laterally was shown on the recent CT to represent callus related to a healing ANTERIOR LEFT third rib fracture. Healed LEFT POSTERIOR third and fourth rib fractures are also present. Previously identified pathologic compression fractures of T10 and T12. IMPRESSION: Acute bronchopneumonia involving the LEFT LOWER LOBE superimposed upon chronic scarring in the lingula and LEFT LOWER LOBE. Electronically Signed   By: Evangeline Dakin M.D.   On: 08/10/2018 12:09   Ct Angio Chest Pe W/cm &/or Wo Cm  Result Date: 08/10/2018 CLINICAL DATA:  Shortness of breath.  Metastatic prostate cancer. EXAM: CT ANGIOGRAPHY CHEST WITH CONTRAST TECHNIQUE: Multidetector CT imaging of the chest was performed using the standard protocol  during bolus administration of intravenous contrast. Multiplanar CT image reconstructions and MIPs were obtained to evaluate the vascular anatomy. CONTRAST:  123m ISOVUE-370 IOPAMIDOL (ISOVUE-370) INJECTION 76% COMPARISON:  Chest x-ray 08/10/2018 and CT scan dated 07/28/2018 FINDINGS: Cardiovascular: Satisfactory opacification of the pulmonary arteries to the segmental level. No evidence of pulmonary embolism. Normal heart size. No pericardial effusion. Mediastinum/Nodes: No enlarged mediastinal, hilar, or axillary lymph nodes. Thyroid gland, trachea, and esophagus demonstrate no significant findings. Lungs/Pleura: There is minimal linear atelectasis at the lung bases. No infiltrates or effusions. Upper Abdomen: Negative. Musculoskeletal: There is a severe compression fracture of T10 in the less severe compression fracture of T12, slightly progressed at T10. No acute bone abnormality. The patient has known metastatic prostate cancer. Review of the MIP images confirms the above findings. IMPRESSION: 1. No pulmonary emboli or other acute abnormalities other than slight atelectasis at the lung bases. 2. Progressive pathologic fracture of T10. No visible tumor in the spinal canal. No visible progression of the pathologic fracture of T12. Electronically Signed   By: JLorriane ShireM.D.   On: 08/10/2018 14:58   Ir Kypho Thoracic With Bone Biopsy  Result Date: 08/17/2018 INDICATION: T10 and T12 compression fractures EXAM: T10 AND T12 KYPHOPLASTY AND BONE BIOPSY COMPARISON:  07/18/2018 MRI MEDICATIONS: As antibiotic prophylaxis, Ancef 2 g was ordered pre-procedure and administered intravenously within 1 hour of incision. ANESTHESIA/SEDATION: Moderate (conscious) sedation was employed during this  procedure. A total of Versed 4 mg and Fentanyl 200 mcg was administered intravenously. Moderate Sedation Time: 45 minutes. The patient's level of consciousness and vital signs were monitored continuously by radiology nursing  throughout the procedure under my direct supervision. FLUOROSCOPY TIME:  Fluoroscopy Time: 11 minutes 30 seconds (956 mGy) COMPLICATIONS: None immediate. PROCEDURE: Following a full explanation of the procedure along with the potential associated complications, an informed witnessed consent was obtained. The back was prepped and draped in a sterile fashion. Maximal barrier technique was utilized. 1% lidocaine was utilized for local anesthesia. Under fluoroscopic guidance, a spinal needle was advanced to the left T10 pedicle. 1% lidocaine was infiltrated. The 10 gauge expressed needle was then advanced through the left T10 pedicle into the vertebral body. A bone drill was advanced into the vertebral body. Bone fragments were sent for biopsy. The 15 2 balloon was then inserted. The identical procedure was performed at T12 via the right pedicle. Again, a 15 2 balloon was inserted. Both balloons were insufflated to 200 pounds per square inch. The balloons were removed. The cement deposition trocar was inserted into each cannula at T10 and T12. Cement was then injected filling the vertebral body with without significant extraosseous cement. Trace cement entered a prevertebral vein at T12. It was stable without embolizing. The trocars were removed. Hemostasis was achieved with direct pressure. IMPRESSION: Successful T10 and T12 kyphoplasty and bone biopsy. Electronically Signed   By: Marybelle Killings M.D.   On: 08/17/2018 15:15   Ir Kypho Ea Addl Level Thoracic Or Lumbar  Result Date: 08/17/2018 INDICATION: T10 and T12 compression fractures EXAM: T10 AND T12 KYPHOPLASTY AND BONE BIOPSY COMPARISON:  07/18/2018 MRI MEDICATIONS: As antibiotic prophylaxis, Ancef 2 g was ordered pre-procedure and administered intravenously within 1 hour of incision. ANESTHESIA/SEDATION: Moderate (conscious) sedation was employed during this procedure. A total of Versed 4 mg and Fentanyl 200 mcg was administered intravenously. Moderate Sedation  Time: 45 minutes. The patient's level of consciousness and vital signs were monitored continuously by radiology nursing throughout the procedure under my direct supervision. FLUOROSCOPY TIME:  Fluoroscopy Time: 11 minutes 30 seconds (213 mGy) COMPLICATIONS: None immediate. PROCEDURE: Following a full explanation of the procedure along with the potential associated complications, an informed witnessed consent was obtained. The back was prepped and draped in a sterile fashion. Maximal barrier technique was utilized. 1% lidocaine was utilized for local anesthesia. Under fluoroscopic guidance, a spinal needle was advanced to the left T10 pedicle. 1% lidocaine was infiltrated. The 10 gauge expressed needle was then advanced through the left T10 pedicle into the vertebral body. A bone drill was advanced into the vertebral body. Bone fragments were sent for biopsy. The 15 2 balloon was then inserted. The identical procedure was performed at T12 via the right pedicle. Again, a 15 2 balloon was inserted. Both balloons were insufflated to 200 pounds per square inch. The balloons were removed. The cement deposition trocar was inserted into each cannula at T10 and T12. Cement was then injected filling the vertebral body with without significant extraosseous cement. Trace cement entered a prevertebral vein at T12. It was stable without embolizing. The trocars were removed. Hemostasis was achieved with direct pressure. IMPRESSION: Successful T10 and T12 kyphoplasty and bone biopsy. Electronically Signed   By: Marybelle Killings M.D.   On: 08/17/2018 15:15   Ir Radiologist Eval & Mgmt  Result Date: 08/24/2018 Please refer to notes tab for details about interventional procedure. (Op Note)  Ir Radiologist Eval &  Mgmt  Result Date: 08/11/2018 Please refer to notes tab for details about interventional procedure. (Op Note)   Labs:  CBC: Recent Labs    04/12/18 0533 08/10/18 1359 08/10/18 1409 08/17/18 1121 08/26/18 0815    WBC 4.1 7.6  --  8.5 7.5  HGB 11.3* 11.4* 11.2* 12.2* 11.8*  HCT 33.0* 34.1* 33.0* 35.5* 35.3*  PLT 138* 221  --  276 242    COAGS: Recent Labs    08/17/18 1121  INR 0.95    BMP: Recent Labs    09/16/17 1056 04/10/18 2343 04/11/18 0543 04/12/18 0533 07/18/18 1546 08/10/18 1359 08/10/18 1409  NA 137 134*  --  136  --  136 135  K 4.2 3.8  --  3.9  --  3.7 3.7  CL 103 103  --  106  --  101 100  CO2 28 19*  --  27  --  24  --   GLUCOSE 108* 97  --  108*  --  116* 112*  BUN 17 15  --  11  --  12 12  CALCIUM 9.7 9.3  --  9.0  --  9.5  --   CREATININE 0.91 0.79 0.74 0.81 0.70 0.74 0.60*  GFRNONAA  --  >60 >60 >60  --  >60  --   GFRAA  --  >60 >60 >60  --  >60  --     LIVER FUNCTION TESTS: Recent Labs    09/02/17 0915 09/16/17 1056 04/10/18 2343 08/10/18 1359  BILITOT 1.0 1.2 0.8 0.7  AST 149* 49* 21 18  ALT 274* 107* 20 22  ALKPHOS 52 49 39 93  PROT 6.8 6.5 7.0 6.1*  ALBUMIN 4.3 4.0 4.2 3.9    TUMOR MARKERS: No results for input(s): AFPTM, CEA, CA199, CHROMGRNA in the last 8760 hours.  Assessment and Plan:  Now scheduled for Lumbar 1-2 Kyphoplasty with Biopsy Risks and benefits of Lumbar 1-2 Kyphoplasty were discussed with the patient including, but not limited to education regarding the natural healing process of compression fractures without intervention, bleeding, infection, cement migration which may cause spinal cord damage, paralysis, pulmonary embolism or even death.  This interventional procedure involves the use of X-rays and because of the nature of the planned procedure, it is possible that we will have prolonged use of X-ray fluoroscopy.  Potential radiation risks to you include (but are not limited to) the following: - A slightly elevated risk for cancer  several years later in life. This risk is typically less than 0.5% percent. This risk is low in comparison to the normal incidence of human cancer, which is 33% for women and 50% for men  according to the [place]. - Radiation induced injury can include skin redness, resembling a rash, tissue breakdown / ulcers and hair loss (which can be temporary or permanent).   The likelihood of either of these occurring depends on the difficulty of the procedure and whether you are sensitive to radiation due to previous procedures, disease, or genetic conditions.   IF your procedure requires a prolonged use of radiation, you will be notified and given written instructions for further action.  It is your responsibility to monitor the irradiated area for the 2 weeks following the procedure and to notify your physician if you are concerned that you have suffered a radiation induced injury.    All of the patient's questions were answered, patient is agreeable to proceed.  Consent signed and in chart.  Thank  you for this interesting consult.  I greatly enjoyed meeting ISSACHAR BROADY and look forward to participating in their care.  A copy of this report was sent to the requesting provider on this date.  Electronically Signed: Lavonia Drafts, PA-C 08/26/2018, 9:04 AM   I spent a total of    25 Minutes in face to face in clinical consultation, greater than 50% of which was counseling/coordinating care for Lumbar 1-2 KP

## 2018-08-26 NOTE — Procedures (Signed)
L1 and L2 KP L1 and L2 Bx EBL 0 Comp 0

## 2018-08-26 NOTE — Progress Notes (Signed)
Resp called to come do breathing tx

## 2018-08-26 NOTE — Progress Notes (Signed)
Up to bathroom C/o weakness and wheezing noted. Pt request Duo neb [name] Holder. PA called and informed new orders noted.

## 2018-08-26 NOTE — Discharge Instructions (Signed)
Balloon Kyphoplasty, Care After Refer to this sheet in the next few weeks. These instructions provide you with information about caring for yourself after your procedure. Your health care provider may also give you more specific instructions. Your treatment has been planned according to current medical practices, but problems sometimes occur. Call your health care provider if you have any problems or questions after your procedure. What can I expect after the procedure? After your procedure, it is common to have back pain. Follow these instructions at home: Incision care  Follow instructions from your health care provider about how to take care of your incisions. Make sure you: ? Wash your hands with soap and water before you change your bandage (dressing). If soap and water are not available, use hand sanitizer. ? Change your dressing as told by your health care provider. ? Leave stitches (sutures), skin glue, or adhesive strips in place. These skin closures may need to be in place for 2 weeks or longer. If adhesive strip edges start to loosen and curl up, you may trim the loose edges. Do not remove adhesive strips completely unless your health care provider tells you to do that.  Check your incision area every day for signs of infection. Watch for: ? Redness, swelling, or pain. ? Fluid, blood, or pus.  Keep your dressing dry until your health care provider says that it can be removed. Activity   Rest your back and avoid intense physical activity for as long as told by your health care provider.  Return to your normal activities as told by your health care provider. Ask your health care provider what activities are safe for you.  Do not lift anything that is heavier than 10 lb (4.5 kg). This is about the weight of a gallon of milk.You may need to avoid heavy lifting for several weeks. General instructions  Take over-the-counter and prescription medicines only as told by your health care  provider.  If directed, apply ice to the painful area: ? Put ice in a plastic bag. ? Place a towel between your skin and the bag. ? Leave the ice on for 20 minutes, 2-3 times per day.  Do not use tobacco products, including cigarettes, chewing tobacco, or e-cigarettes. If you need help quitting, ask your health care provider.  Keep all follow-up visits as told by your health care provider. This is important. Contact a health care provider if:  You have a fever.  You have redness, swelling, or pain at the site of your incisions.  You have fluid, blood, or pus coming from your incisions.  You have pain that gets worse or does not get better with medicine.  You develop numbness or weakness in any part of your body. Get help right away if:  You have chest pain.  You have difficulty breathing.  You cannot move your legs.  You cannot control your bladder or bowel movements.  You suddenly become weak or numb on one side of your body.  You become very confused.  You have trouble speaking or understanding, or both. This information is not intended to replace advice given to you by your health care provider. Make sure you discuss any questions you have with your health care provider. Document Released: 04/03/2015 Document Revised: 12/19/2015 Document Reviewed: 11/05/2014 Elsevier Interactive Patient Education  2019 [employer]Kyphosis is a spinal disorder. It involves an excessive outward curve of the spine that causes abnormal rounding of the upper back. It occurs when the spinal bones (  vertebrae) in the upper back (thoracic spine) become wedge-shaped and cause deformity. Kyphosis is sometimes called dowager's hump, hunchback, or roundback. It is most common among elderly people, but it can occur at any age.

## 2018-08-28 ENCOUNTER — Encounter (HOSPITAL_COMMUNITY): Payer: Self-pay

## 2018-08-28 ENCOUNTER — Emergency Department (HOSPITAL_COMMUNITY)
Admission: EM | Admit: 2018-08-28 | Discharge: 2018-08-28 | Disposition: A | Discharge status: Home / Self Care | Payer: BLUE CROSS/BLUE SHIELD | Attending: Emergency Medicine | Admitting: Emergency Medicine

## 2018-08-28 ENCOUNTER — Other Ambulatory Visit: Payer: Self-pay

## 2018-08-28 ENCOUNTER — Emergency Department (HOSPITAL_COMMUNITY): Payer: BLUE CROSS/BLUE SHIELD

## 2018-08-28 DIAGNOSIS — Z87891 Personal history of nicotine dependence: Secondary | ICD-10-CM | POA: Insufficient documentation

## 2018-08-28 DIAGNOSIS — R0602 Shortness of breath: Secondary | ICD-10-CM | POA: Diagnosis present

## 2018-08-28 DIAGNOSIS — Z8546 Personal history of malignant neoplasm of prostate: Secondary | ICD-10-CM | POA: Insufficient documentation

## 2018-08-28 DIAGNOSIS — Z79899 Other long term (current) drug therapy: Secondary | ICD-10-CM | POA: Insufficient documentation

## 2018-08-28 DIAGNOSIS — R0789 Other chest pain: Secondary | ICD-10-CM | POA: Insufficient documentation

## 2018-08-28 DIAGNOSIS — J45901 Unspecified asthma with (acute) exacerbation: Secondary | ICD-10-CM

## 2018-08-28 DIAGNOSIS — I10 Essential (primary) hypertension: Secondary | ICD-10-CM | POA: Diagnosis not present

## 2018-08-28 DIAGNOSIS — R109 Unspecified abdominal pain: Secondary | ICD-10-CM | POA: Insufficient documentation

## 2018-08-28 DIAGNOSIS — Z9221 Personal history of antineoplastic chemotherapy: Secondary | ICD-10-CM | POA: Diagnosis not present

## 2018-08-28 DIAGNOSIS — C7952 Secondary malignant neoplasm of bone marrow: Secondary | ICD-10-CM | POA: Insufficient documentation

## 2018-08-28 LAB — BASIC METABOLIC PANEL
Anion gap: 10 (ref 5–15)
BUN: 10 mg/dL (ref 8–23)
CO2: 24 mmol/L (ref 22–32)
Calcium: 9.9 mg/dL (ref 8.9–10.3)
Chloride: 103 mmol/L (ref 98–111)
Creatinine, Ser: 0.88 mg/dL (ref 0.61–1.24)
GFR calc Af Amer: >60 mL/min (ref >60)
GFR calc non Af Amer: >60 mL/min (ref >60)
Glucose, Bld: 109 mg/dL — ABNORMAL HIGH (ref 70–99)
Potassium: 3.6 mmol/L (ref 3.5–5.1)
Sodium: 137 mmol/L (ref 135–145)

## 2018-08-28 LAB — CBC WITH DIFFERENTIAL/PLATELET
Abs Immature Granulocytes: 0.05 10*3/uL (ref 0.00–0.07)
Basophils Absolute: 0 10*3/uL (ref 0.0–0.1)
Basophils Relative: 0 %
Eosinophils Absolute: 0 10*3/uL (ref 0.0–0.5)
Eosinophils Relative: 0 %
HCT: 41.5 % (ref 39.0–52.0)
Hemoglobin: 13.7 g/dL (ref 13.0–17.0)
Immature Granulocytes: 1 %
Lymphocytes Relative: 12 %
Lymphs Abs: 1 10*3/uL (ref 0.7–4.0)
MCH: 32.8 pg (ref 26.0–34.0)
MCHC: 33 g/dL (ref 30.0–36.0)
MCV: 99.3 fL (ref 80.0–100.0)
Monocytes Absolute: 0.6 10*3/uL (ref 0.1–1.0)
Monocytes Relative: 8 %
Neutro Abs: 6.4 10*3/uL (ref 1.7–7.7)
Neutrophils Relative %: 79 %
Platelets: 277 10*3/uL (ref 150–400)
RBC: 4.18 MIL/uL — ABNORMAL LOW (ref 4.22–5.81)
RDW: 12.2 % (ref 11.5–15.5)
WBC: 8.1 10*3/uL (ref 4.0–10.5)
nRBC: 0 % (ref 0.0–0.2)

## 2018-08-28 LAB — I-STAT TROPONIN, ED: Troponin i, poc: 0.01 ng/mL (ref 0.00–0.08)

## 2018-08-28 MED ORDER — PREDNISONE 20 MG PO TABS
40.0000 mg | ORAL_TABLET | Freq: Once | ORAL | Status: AC
  Administered 2018-08-28: 40 mg via ORAL
  Filled 2018-08-28: qty 2

## 2018-08-28 MED ORDER — ALBUTEROL (5 MG/ML) CONTINUOUS INHALATION SOLN
5.0000 mg/h | INHALATION_SOLUTION | Freq: Once | RESPIRATORY_TRACT | Status: AC
  Administered 2018-08-28: 5 mg/h via RESPIRATORY_TRACT
  Filled 2018-08-28: qty 20

## 2018-08-28 MED ORDER — PREDNISONE 5 MG PO TABS: 40.0000 mg | ORAL_TABLET | Freq: Every day | ORAL | 0 refills | Status: DC

## 2018-08-28 NOTE — ED Notes (Signed)
Bed: WA05 Expected date:  Expected time:  Means of arrival:  Comments: Triage 1 

## 2018-08-28 NOTE — Discharge Instructions (Addendum)
You were seen in the emergency department for worsening shortness of breath.  You had chest x-ray EKG and blood work that did not show an obvious cause of your symptoms.  This is likely asthma and we are placing you back on a steroid taper.  Please continue to use your breathing medications.  It will be important for you to follow-up with your doctor and please discuss another referral to pulmonology.  Return if any concerns.

## 2018-08-28 NOTE — ED Provider Notes (Signed)
[facility] DEPT Provider Note   CSN: [id number] Arrival date & time: 08/28/18  1030     History   Chief Complaint Chief Complaint  Patient presents with  . Shortness of Breath  . Bilateral Rib Pain    HPI [name] Holder is a 63 y.o. male.  He is presenting here today with shortness of breath that began last night.  He has a history of asthma and he has had multiple exacerbations over the last month or so.  He finished a steroid taper about a week ago and has been treated with a course of Zithromax.  He is on a low dose of steroids chronically for his chemotherapy.  He had a chest CT done a few weeks ago that did not show PE.  He is complaining of some tightness in his abdomen and a little bit of chest tightness and pain in his ribs.  He said acutely at 3 AM he was very short of breath and became very anxious about this.  Currently says his symptoms are improved although he still feels tight.  The history is provided by the patient and the spouse.  Shortness of Breath  Severity:  Moderate Onset quality:  Sudden Duration:  9 hours Timing:  Intermittent Progression:  Partially resolved Chronicity:  Recurrent Context: not smoke exposure   Relieved by:  Nothing Worsened by:  Activity Ineffective treatments:  Inhaler and position changes Associated symptoms: abdominal pain, chest pain and wheezing   Associated symptoms: no fever, no headaches, no hemoptysis, no rash, no sore throat, no sputum production and no syncope   Risk factors: hx of cancer     Past Medical History:  Diagnosis Date  . Allergy   . Asthma   . Elevated PSA 10/03/2012   23.37  . History of kidney stones   . History of kidney stones   . Hx of colonic polyps   . Hypertension   . Osteopenia   . Prostate cancer [facility])     Patient Active Problem List   Diagnosis Date Noted  . Secondary malignant neoplasm of bone and bone marrow ([place]) 08/06/2018  . Spinal stenosis in  cervical region 04/12/2018  . Weakness 04/11/2018  . Elevated LFTs 09/16/2017  . Routine general medical examination at a health care facility 08/29/2015  . Hyperlipidemia with target LDL less than 100 12/21/2013  . Other abnormal glucose 12/21/2013  . Upper airway cough syndrome 07/25/2013  . History of prostate cancer   . Essential hypertension 05/20/2010  . Allergic rhinitis 05/20/2010  . Asthma, mild intermittent 05/20/2010    Past Surgical History:  Procedure Laterality Date  . COLONOSCOPY    . HERNIA REPAIR  ?2008  . IR KYPHO EA ADDL LEVEL THORACIC OR LUMBAR  08/17/2018  . IR KYPHO EA ADDL LEVEL THORACIC OR LUMBAR  08/26/2018  . IR KYPHO LUMBAR INC FX REDUCE BONE BX UNI/BIL CANNULATION INC/IMAGING  08/26/2018  . IR KYPHO THORACIC WITH BONE BIOPSY  08/17/2018  . IR RADIOLOGIST EVAL & MGMT  08/11/2018  . IR RADIOLOGIST EVAL & MGMT  08/24/2018  . LYMPHADENECTOMY Left 01/05/2013   Procedure: ROBOTIC LYMPHADENECTOMY;  Surgeon: Dutch Gray, MD;  Location: [facility] ORS;  Service: Urology;  Laterality: Left;  . PROSTATE BIOPSY     11/12 positive biopsies  . ROTATOR CUFF REPAIR  2012        Home Medications    Prior to Admission medications   Medication Sig Start Date End Date Taking?  Authorizing Provider  abiraterone acetate (ZYTIGA) 250 MG tablet Take 1,000 mg by mouth daily.  08/17/17   [provider]  albuterol (PROAIR HFA) 108 (90 Base) MCG/ACT inhaler Inhale 2 puffs into the lungs every 6 (six) hours as needed for wheezing or shortness of breath. 08/10/18   Caccavale, Sophia, PA-C  albuterol (PROVENTIL) (2.5 MG/3ML) 0.083% nebulizer solution Take 3 mLs (2.5 mg total) by nebulization every 6 (six) hours as needed for wheezing or shortness of breath. 08/15/18   Marrian Salvage, FNP  azelastine (ASTELIN) 0.1 % nasal spray Place 2 sprays into both nostrils every morning. Patient taking differently: Place 2 sprays into both nostrils daily as needed for allergies.  04/12/18    Murlean Iba, MD  azithromycin (ZITHROMAX) 250 MG tablet 2 tabs po qd x 1 day; 1 tablet per day x 4 days; 08/15/18   Marrian Salvage, FNP  CALCIUM-VITAMIN D PO Take 1 tablet by mouth daily.     [provider]  carvedilol (COREG) 3.125 MG tablet Take 1 tablet (3.125 mg total) by mouth 2 (two) times daily with a meal. 06/09/18   [name], MD  cyanocobalamin (,VITAMIN B-12,) 1000 MCG/ML injection Inject 1,000 mcg into the muscle every 30 (thirty) days.  05/14/18   [provider]  cyclobenzaprine (FLEXERIL) 5 MG tablet Take 5 mg by mouth 3 (three) times daily as needed for muscle spasms.  05/16/18   [provider]  docusate sodium (COLACE) 100 MG capsule Take 100 mg by mouth daily.     [provider]  doxepin (SINEQUAN) 10 MG capsule Take 10 mg by mouth at bedtime.  08/02/18   [provider]  fentaNYL (DURAGESIC - DOSED MCG/HR) 25 MCG/HR patch Place 1 patch (25 mcg total) onto the skin every 3 (three) days. 08/05/18   Wyatt Portela, MD  ibuprofen (ADVIL,MOTRIN) 200 MG tablet Take 400 mg by mouth 2 (two) times daily.     [provider]  ipratropium-albuterol (DUONEB) 0.5-2.5 (3) MG/3ML SOLN Take 3 mLs by nebulization every 4 (four) hours as needed. 08/19/18   Marrian Salvage, FNP  Leuprolide Acetate (LUPRON IJ) Inject 1 Dose as directed every 4 (four) months. This is administered by [facility]    [provider]  methocarbamol (ROBAXIN) 500 MG tablet Take 500 mg by mouth at bedtime as needed for muscle spasms.    [provider]  mometasone (NASONEX) 50 MCG/ACT nasal spray USE 1 TO 2 SPRAYS IN EACH NOSTRIL ONCE DAILY. Patient taking differently: Place 2 sprays into the nose daily as needed (allergies).  09/16/17   [name], MD  NARCAN 4 MG/0.1ML LIQD nasal spray kit  08/18/18   [provider]  olmesartan (BENICAR) 40 MG tablet TAKE 1 TABLET DAILY 08/08/18   [name], MD    oxyCODONE (OXY IR/ROXICODONE) 5 MG immediate release tablet Take 1 tablet (5 mg total) by mouth every 4 (four) hours as needed for severe pain (may take 1-2 tablets every 4 hours). 08/18/18   Wyatt Portela, MD  predniSONE (STERAPRED UNI-PAK 21 TAB) 10 MG (21) TBPK tablet Taper as directed 08/19/18   Marrian Salvage, FNP  SYMBICORT 160-4.5 MCG/ACT inhaler Inhale 2 puffs into the lungs 2 (two) times daily. 08/19/18   [name], MD  SYRINGE-NEEDLE, DISP, 3 ML 3 ML MISC Use to inject b12 monthly. 04/19/18   [name], MD    Family History Family History  Problem Relation Age of Onset  . Arthritis Mother   . Diabetes Mother   . Hypertension Mother   . Hyperlipidemia Mother   . Hypertension Father   . Prostate cancer Father        radiation in mid 59s  . Heart attack Father   . Cancer Sister        precancerous colon polyps, 6 in colon removed  . Stroke Maternal Grandfather     Social History Social History   Tobacco Use  . Smoking status: Former Smoker    Packs/day: 0.50    Years: 15.00    Pack years: 7.50    Types: Cigarettes  . Smokeless tobacco: Never Used  Substance Use Topics  . Alcohol use: Yes    Alcohol/week: 2.0 standard drinks    Types: 2 Glasses of wine per week    Comment: wine  1  glasses daily  . Drug use: No     Allergies   Amlodipine and Tetracycline   Review of Systems Review of Systems  Constitutional: Negative for fever.  HENT: Negative for sore throat.   Eyes: Negative for visual disturbance.  Respiratory: Positive for shortness of breath and wheezing. Negative for hemoptysis and sputum production.   Cardiovascular: Positive for chest pain. Negative for syncope.  Gastrointestinal: Positive for abdominal pain.  Genitourinary: Negative for dysuria.  Musculoskeletal: Positive for back pain.  Skin: Negative for rash.  Neurological: Negative for headaches.     Physical Exam Updated Vital Signs BP (!) 90/50 (BP Location: Right  Arm)   Pulse (!) 102   Temp 98 F (36.7 C) (Oral)   Resp (!) 25   Ht '5\' 8"'$  (1.727 m)   Wt 89 kg   SpO2 98%   BMI 29.83 kg/m   Physical Exam Vitals signs and nursing note reviewed.  Constitutional:      Appearance: He is well-developed.  HENT:     Head: Normocephalic and atraumatic.  Eyes:     Conjunctiva/sclera: Conjunctivae normal.  Neck:     Musculoskeletal: Neck supple.  Cardiovascular:     Rate and Rhythm: Normal rate and regular rhythm.     Heart sounds: No murmur.  Pulmonary:     Effort: Pulmonary effort is normal. No respiratory distress.     Breath sounds: Normal breath sounds.  Abdominal:     Palpations: Abdomen is soft.     Tenderness: There is no abdominal tenderness.  Musculoskeletal:        General: No signs of injury.     Right lower leg: He exhibits no tenderness. No edema.     Left lower leg: He exhibits no tenderness. No edema.  Skin:    General: Skin is warm and dry.     Capillary Refill: Capillary refill takes less than 2 seconds.  Neurological:     General: No focal deficit present.     Mental Status: He is alert.      ED Treatments / Results  Labs (all labs ordered are listed, but only abnormal results are displayed) Labs Reviewed  BASIC METABOLIC PANEL - Abnormal; Notable for the following components:      Result Value   Glucose, Bld 109 (*)    All other components within normal limits  CBC WITH DIFFERENTIAL/PLATELET - Abnormal; Notable for the following components:   RBC 4.18 (*)    All other components within normal limits  I-STAT TROPONIN, ED    EKG EKG Interpretation  Date/Time:  Sunday August 28 2018 10:56:40 EST Ventricular Rate:  102 PR Interval:    QRS Duration: 88 QT Interval:  349 QTC Calculation: 455 R Axis:   -3 Text Interpretation:  Sinus tachycardia Atrial premature complex Left atrial enlargement Borderline T abnormalities, inferior leads similar to prior Confirmed by Aletta Edouard [phone]) on 08/28/2018 3:20:10  PM   Radiology Dg Chest 2 View  Result Date: 08/28/2018 CLINICAL DATA:  Shortness of breath. EXAM: CHEST - 2 VIEW COMPARISON:  Radiographs of August 15, 2018. FINDINGS: The heart size and mediastinal contours are within normal limits. No pneumothorax or pleural effusion is noted. Right lung is clear. Stable left basilar subsegmental atelectasis or scarring is noted. The visualized skeletal structures are unremarkable. IMPRESSION: Stable left basilar subsegmental atelectasis or scarring. Electronically Signed   By: Marijo Conception, M.D.   On: 08/28/2018 11:47    Procedures Procedures (including critical care time)  Medications Ordered in ED Medications  albuterol (PROVENTIL,VENTOLIN) solution continuous neb (5 mg/hr Nebulization Given 08/28/18 1142)  predniSONE (DELTASONE) tablet 40 mg (40 mg Oral Given 08/28/18 1142)     Initial Impression / Assessment and Plan / ED Course  I have reviewed the triage vital signs and the nursing notes.  Pertinent labs & imaging results that were available during my care of the patient were reviewed by me and considered in my medical decision making (see chart for details).  Clinical Course as of Aug 28 1517  Sun Aug 28, 2018  1137 Patient initial vital signs in triage showed him to be hypotensive with a blood pressure 90.  When I see him in the room his blood pressures back to 120s.  He is speaking in full sentences looking very comfortable.  He said the primary care doctor was hesitant to put him on another course of steroids as he was getting kyphoplasty and a bladder surgery.  He said he had the kyphoplasty and that is improved his back symptoms.  His bladder surgery has been put off for a little while so he was hoping that we could put him on another course of some steroids.  He is agreeable to lab work and chest x-ray and will give him some breathing treatments here.   [MB]  2010 EKG is sinus tachycardia rate of 102, Normal intervals nonspecific ST-T's.     [MB]  1244 Patient states he feels better after breathing treatment.  Vitals have normalized.  He is comfortable going home and will start on another prednisone taper.  He understands to follow-up with his primary care doctor.   [MB]    Clinical Course User Index [MB] Hayden Rasmussen, MD     Final Clinical Impressions(s) / ED Diagnoses   Final diagnoses:  Exacerbation of persistent asthma, unspecified asthma severity    ED Discharge Orders         Ordered    predniSONE (DELTASONE) 5 MG tablet  Daily     08/28/18 1246           Hayden Rasmussen, MD 08/28/18 1520

## 2018-08-28 NOTE — ED Triage Notes (Signed)
Patient is AOx4 and ambulatory  However using wheel chair due to SOB and pain bilateral sides / ribs. Patient began to have symptoms last night and has gradually worsened. Unknown what has aggravated or caused symptoms. Patient is currently being treated for Prostate cancer and 4 collapsed vertebrae which may be  r/t cancer. Patient wife at bedside.

## 2018-08-29 ENCOUNTER — Telehealth: Payer: Self-pay | Admitting: Oncology

## 2018-08-29 NOTE — Telephone Encounter (Signed)
Faxed most recent office note to [facility]. Release MB#[id number]

## 2018-09-01 ENCOUNTER — Other Ambulatory Visit: Payer: Self-pay | Admitting: Interventional Radiology

## 2018-09-01 ENCOUNTER — Ambulatory Visit: Payer: BLUE CROSS/BLUE SHIELD | Admitting: Family

## 2018-09-01 ENCOUNTER — Other Ambulatory Visit: Payer: Self-pay | Admitting: Radiology

## 2018-09-01 ENCOUNTER — Ambulatory Visit: Payer: BLUE CROSS/BLUE SHIELD | Admitting: Family Medicine

## 2018-09-01 DIAGNOSIS — Z9889 Other specified postprocedural states: Secondary | ICD-10-CM

## 2018-09-01 MED ORDER — DIAZEPAM 5 MG PO TABS: 10.0000 mg | ORAL_TABLET | ORAL | 0 refills | Status: DC

## 2018-09-01 NOTE — Progress Notes (Deleted)
   Subjective:    Patient ID: [name] Holder, male    DOB: 23-Nov-1955, 63 y.o.   MRN: [id number]  HPI  Mr. Allen is a 63 year old male who is here today for evaluation of asthma. He went to the ED on 08/28/2018 for SOB. He reported multiple exacerbations over the previous month or so.  He was evaluated and treated  on 08/19/2018 for exacerbation of asthma and provided steroid taper and trial of Duo-neb instead of albuterol alone.  Prior to this on 08/15/2018, he was evaluated for SOB and was seen in the ED on 08/10/2018 for similar symptoms. On 08/10/2018 when he was seen in the ED, CXR did show pneumonia but that was thought to not be related to bacterial source. He was treated with prednisone/no antibiotics at that time.  Visit on 08/15/2018, CXR was repeated and no pneumonia was seen at that time. He was provided with a duo-neb and started a Z-pak  To cover for possible infection. Prednisone taper extended and home nebulizer was initiated.  He is on a low dose of steroids chronically due to chemotherapy.  Today,    Review of Systems     Objective:   Physical Exam        Assessment & Plan:

## 2018-09-02 ENCOUNTER — Encounter (HOSPITAL_COMMUNITY): Payer: Self-pay

## 2018-09-02 ENCOUNTER — Ambulatory Visit (HOSPITAL_COMMUNITY)
Admission: RE | Admit: 2018-09-02 | Discharge: 2018-09-02 | Disposition: A | Discharge status: Home / Self Care | Payer: BLUE CROSS/BLUE SHIELD | Source: Ambulatory Visit | Attending: Interventional Radiology | Admitting: Interventional Radiology

## 2018-09-02 ENCOUNTER — Ambulatory Visit: Payer: BLUE CROSS/BLUE SHIELD | Admitting: Family

## 2018-09-02 DIAGNOSIS — Z9889 Other specified postprocedural states: Secondary | ICD-10-CM

## 2018-09-02 NOTE — Progress Notes (Signed)
Multiple attempts at being able to lay pt flat. Pt in severe pain. All avenues exhausted. Pt was unable to lay flat for more than about 2 mins before having to immediately sit up. Pt has taken two valiums and pain meds prior to exam. Pt was not able to even get into bore of scanner due to pain at this time.

## 2018-09-05 ENCOUNTER — Telehealth: Payer: Self-pay | Admitting: Oncology

## 2018-09-05 ENCOUNTER — Inpatient Hospital Stay: Payer: BLUE CROSS/BLUE SHIELD | Attending: Oncology | Admitting: Oncology

## 2018-09-05 ENCOUNTER — Other Ambulatory Visit: Payer: Self-pay

## 2018-09-05 ENCOUNTER — Other Ambulatory Visit: Payer: Self-pay | Admitting: Internal Medicine

## 2018-09-05 VITALS — BP 118/77 | HR 103 | Temp 98.4°F | Resp 18 | Ht 68.0 in | Wt 175.0 lb

## 2018-09-05 DIAGNOSIS — Z79899 Other long term (current) drug therapy: Secondary | ICD-10-CM | POA: Insufficient documentation

## 2018-09-05 DIAGNOSIS — Z791 Long term (current) use of non-steroidal anti-inflammatories (NSAID): Secondary | ICD-10-CM | POA: Diagnosis not present

## 2018-09-05 DIAGNOSIS — Z8546 Personal history of malignant neoplasm of prostate: Secondary | ICD-10-CM | POA: Diagnosis not present

## 2018-09-05 DIAGNOSIS — C61 Malignant neoplasm of prostate: Secondary | ICD-10-CM | POA: Insufficient documentation

## 2018-09-05 DIAGNOSIS — C7951 Secondary malignant neoplasm of bone: Secondary | ICD-10-CM

## 2018-09-05 DIAGNOSIS — I10 Essential (primary) hypertension: Secondary | ICD-10-CM

## 2018-09-05 DIAGNOSIS — M549 Dorsalgia, unspecified: Secondary | ICD-10-CM | POA: Diagnosis not present

## 2018-09-05 DIAGNOSIS — C7952 Secondary malignant neoplasm of bone marrow: Principal | ICD-10-CM

## 2018-09-05 DIAGNOSIS — M4854XA Collapsed vertebra, not elsewhere classified, thoracic region, initial encounter for fracture: Secondary | ICD-10-CM

## 2018-09-05 MED ORDER — OXYCODONE HCL 5 MG PO TABS: 5.0000 mg | ORAL_TABLET | ORAL | 0 refills | Status: DC | PRN

## 2018-09-05 MED ORDER — MORPHINE SULFATE ER 30 MG PO TBCR: 30.0000 mg | EXTENDED_RELEASE_TABLET | Freq: Two times a day (BID) | ORAL | 0 refills | Status: DC

## 2018-09-05 NOTE — Progress Notes (Signed)
Hematology and Oncology Follow Up Visit  [name] Holder [id number] [birth year] 63 y.o. 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  His PSA was 23 and a Gleason score of 7.   Prior Therapy:  Androgen deprivation therapy in 2014.  He developed castration resistant in 2018.  Current therapy:  Zytiga 1000 mg daily with prednisone at 5 mg daily started in 2018.  Interim History: [name] Holder presents today for a follow-up visit.  Since the last visit, he underwent kyphoplasty procedure on August 26, 2018 and tolerated the procedure well.  He had some improvement in his back pain although did not last except for few days.  He continues to have issues with back pain and overall disability related to it.  He uses oxycodone 1 or 2 tablets every 4-6 hours.  His mobility and quality of life continues to decline.  He does not report any headaches, blurry vision, syncope or seizures. Does not report any fevers, chills or sweats.  Does not report any cough, wheezing or hemoptysis.  Does not report any chest pain, palpitation, orthopnea or leg edema.  Does not report any nausea, vomiting or abdominal pain.  Does not report any constipation or diarrhea.  Does not report any skeletal complaints.    Does not report frequency, urgency or hematuria.  Does not report any skin rashes or lesions. Does not report any heat or cold intolerance.  Does not report any lymphadenopathy or petechiae.  Does not report any anxiety or depression.  Remaining review of systems is negative.    Medications: I have reviewed the patient's current medications.  Current Outpatient Medications  Medication Sig Dispense Refill  . abiraterone acetate (ZYTIGA) 250 MG tablet Take 1,000 mg by mouth daily.   10  . albuterol (PROAIR HFA) 108 (90 Base) MCG/ACT inhaler Inhale 2 puffs into the lungs every 6 (six)  hours as needed for wheezing or shortness of breath. 8.5 g 3  . albuterol (PROVENTIL) (2.5 MG/3ML) 0.083% nebulizer solution Take 3 mLs (2.5 mg total) by nebulization every 6 (six) hours as needed for wheezing or shortness of breath. 150 mL 1  . azelastine (ASTELIN) 0.1 % nasal spray Place 2 sprays into both nostrils every morning. (Patient taking differently: Place 2 sprays into both nostrils daily as needed for allergies. )    . azithromycin (ZITHROMAX) 250 MG tablet 2 tabs po qd x 1 day; 1 tablet per day x 4 days; 6 tablet 0  . CALCIUM-VITAMIN D PO Take 1 tablet by mouth daily.     . carvedilol (COREG) 3.125 MG tablet Take 1 tablet (3.125 mg total) by mouth 2 (two) times daily with a meal. 180 tablet 0  . cyanocobalamin (,VITAMIN B-12,) 1000 MCG/ML injection Inject 1,000 mcg into the muscle every 30 (thirty) days.   4  . cyclobenzaprine (FLEXERIL) 5 MG tablet Take 5 mg by mouth 3 (three) times daily as needed for muscle spasms.     . diazepam (VALIUM) 5 MG tablet Take 2 tablets (10 mg total) by mouth as directed. Take 2 tablets (10 mg total) by mouth prior to MR.  May take additional 5 mg tablet by mouth if needed.  [name] Holder to drive patient 3 tablet 0  . docusate sodium (COLACE) 100 MG capsule Take 100 mg by mouth daily.     [name] doxepin (SINEQUAN) 10 MG capsule Take 10 mg by mouth  at bedtime.     [name] ibuprofen (ADVIL,MOTRIN) 200 MG tablet Take 400 mg by mouth 2 (two) times daily.     [name] ipratropium-albuterol (DUONEB) 0.5-2.5 (3) MG/3ML SOLN Take 3 mLs by nebulization every 4 (four) hours as needed. 360 mL 1  . Leuprolide Acetate (LUPRON IJ) Inject 1 Dose as directed every 4 (four) months. This is administered by [facility]    . methocarbamol (ROBAXIN) 500 MG tablet Take 500 mg by mouth at bedtime as needed for muscle spasms.    . mometasone (NASONEX) 50 MCG/ACT nasal spray USE 1 TO 2 SPRAYS IN EACH NOSTRIL ONCE DAILY. (Patient taking differently: Place 2 sprays into the nose daily as needed  (allergies). ) 17 g 5  . morphine (MS CONTIN) 30 MG 12 hr tablet Take 1 tablet (30 mg total) by mouth every 12 (twelve) hours. 60 tablet 0  . NARCAN 4 MG/0.1ML LIQD nasal spray kit     . olmesartan (BENICAR) 40 MG tablet TAKE 1 TABLET DAILY 90 tablet 0  . oxyCODONE (OXY IR/ROXICODONE) 5 MG immediate release tablet Take 1 tablet (5 mg total) by mouth every 4 (four) hours as needed for severe pain (may take 1-2 tablets every 4 hours). 120 tablet 0  . predniSONE (DELTASONE) 5 MG tablet Take 8 tablets (40 mg total) by mouth daily. Taper as directed 60 tablet 0  . SYMBICORT 160-4.5 MCG/ACT inhaler Inhale 2 puffs into the lungs 2 (two) times daily. 30.6 g 1  . SYRINGE-NEEDLE, DISP, 3 ML 3 ML MISC Use to inject b12 monthly. 12 each 0   No current facility-administered medications for this visit.      Allergies:  Allergies  Allergen Reactions  . Amlodipine Swelling    edema  . Tetracycline Rash    Past Medical History, Surgical history, Social history, and Family History were reviewed and updated.   Physical Exam: Blood pressure 118/77, pulse (!) 103, temperature 98.4 F (36.9 C), temperature source Oral, resp. rate 18, height '5\' 8"'$  (1.727 m), weight 175 lb (79.4 kg), SpO2 100 %. ECOG: 1   General appearance: Comfortable appearing without any discomfort Head: Normocephalic without any trauma Oropharynx: Mucous membranes are moist and pink without any thrush or ulcers. Eyes: Pupils are equal and round reactive to light. Lymph nodes: No cervical, supraclavicular, inguinal or axillary lymphadenopathy.   Heart:regular rate and rhythm.  S1 and S2 without leg edema. Lung: Clear without any rhonchi or wheezes.  No dullness to percussion. Abdomin: Soft, nontender, nondistended with good bowel sounds.  No hepatosplenomegaly. Musculoskeletal: No joint deformity or effusion.  Full range of motion noted. Neurological: No deficits noted on motor, sensory and deep tendon reflex exam. Skin: No  petechial rash or dryness.  Appeared moist.      Lab Results: Lab Results  Component Value Date   WBC 8.1 08/28/2018   HGB 13.7 08/28/2018   HCT 41.5 08/28/2018   MCV 99.3 08/28/2018   PLT 277 08/28/2018     Chemistry      Component Value Date/Time   NA 137 08/28/2018 1203   K 3.6 08/28/2018 1203   CL 103 08/28/2018 1203   CO2 24 08/28/2018 1203   BUN 10 08/28/2018 1203   CREATININE 0.88 08/28/2018 1203      Component Value Date/Time   CALCIUM 9.9 08/28/2018 1203   ALKPHOS 93 08/10/2018 1359   AST 18 08/10/2018 1359   ALT 22 08/10/2018 1359   BILITOT 0.7 08/10/2018 1359  Impression and Plan:  63 year old man with:  1.    Prostate cancer diagnosed in 2014.  He has castration-resistant disease since 2018.  He has disease predominantly in the lymph nodes without any bone involvement.  The natural course of this disease was reviewed again and he continues to be on Zytiga and prednisone with excellent PSA response.  He does not require any additional treatment at this time I recommended continuing this.  2.  Compression fracture tween T10 and T12.    Status post kyphoplasty with bone biopsy showed no malignancy.   3.  Bone directed therapy: He will be a candidate for Xgeva or Prolia once he completes dental screening.  I recommended of proceeding with calcium and vitamin D supplements.  4.  Pain: He is requiring still a lot of breakthrough pain medication.  I recommended proceeding with morphine at 30 mg twice a day for long-acting purposes and use oxycodone as needed.  5.  Prognosis and goals of care: Therapy remains palliative and his disease is incurable.  His prostate cancer under reasonable control at least for the time being.  6.  Follow-up: We will be in 4 to 5 weeks to follow his progress.  25  minutes was spent with the patient face-to-face today.  More than 50% of time was dedicated to  discussing imaging studies, treatment options and  coordinating plan of care.Zola Button, MD 2/10/202010:02 AM

## 2018-09-05 NOTE — Telephone Encounter (Signed)
Scheduled appt per 2/10 los. ° °Printed calendar and avs. °

## 2018-09-06 ENCOUNTER — Other Ambulatory Visit: Payer: Self-pay | Admitting: *Deleted

## 2018-09-06 ENCOUNTER — Telehealth: Payer: Self-pay | Admitting: *Deleted

## 2018-09-06 DIAGNOSIS — C7951 Secondary malignant neoplasm of bone: Secondary | ICD-10-CM

## 2018-09-06 DIAGNOSIS — C7952 Secondary malignant neoplasm of bone marrow: Principal | ICD-10-CM

## 2018-09-06 NOTE — Telephone Encounter (Signed)
Called karen nusbaum with [facility]. Requesting order form for dr Alen Blew to sign for patient to have a hospital bed. Per karen, no need to sign form. Order is in. Patient's family now requests an alternating pressure mattress and a trapeze bar. New order put in for APP mattress and trapeze bar.

## 2018-09-06 NOTE — Patient Instructions (Addendum)
[name] Holder  December 28, 1955     Your procedure is scheduled on:  09-15-2018    Report to [facility] Main  Entrance,  Report to admitting at  1:30 PM    Call this number if you have problems the morning of surgery [phone]        Remember: Do not eat food After Midnight.   Clear liquid diet from midnight until 9:30 AM.  Nothing by mouth after 9:30 AM including water, candy, gum, mints.   BRUSH YOUR TEETH MORNING OF SURGERY AND RINSE YOUR MOUTH OUT        Take these medicines the morning of surgery with A SIP OF WATER:   MS Contin, Symbicort inhaler,  Carvedilol (coreg), Prednisone, Docusate (stool softner),  Do DuoNeb Nebulizer ,   Nasal spray's as usual, ProAir inhaler if needed and   bring with you day of surgery                                  You may not have any metal on your body including hair pins and               piercings  Do not wear jewelry, make-up, lotions, powders or perfumes, deodorant                          Men may shave face and neck.      Do not bring valuables to the hospital. [place].  Contacts, dentures or bridgework may not be worn into surgery.  Leave suitcase in the car. After surgery it may be brought to your room.     Patients discharged the day of surgery will not be allowed to drive home. IF YOU ARE HAVING SURGERY AND GOING HOME THE SAME DAY, YOU MUST HAVE AN ADULT TO DRIVE YOU HOME AND BE WITH YOU FOR 24 HOURS. YOU MAY GO HOME BY TAXI OR UBER OR ORTHERWISE, BUT AN ADULT MUST ACCOMPANY YOU HOME AND STAY WITH YOU FOR 24 HOURS.     Name and phone number of your driver:   Wife--  Curt Bears  #[phone]             _____________________________________________________________________     CLEAR LIQUID DIET   Foods Allowed                                                                     Foods Excluded  Coffee and tea, regular and decaf                              liquids that you cannot  Plain Jell-O in any flavor  see through such as: Fruit ices (not with fruit pulp)                                     milk, soups, orange juice  Iced Popsicles                                    All solid food Carbonated beverages, regular and diet                                    Cranberry, grape and apple juices Sports drinks like Gatorade Lightly seasoned clear broth or consume(fat free) Sugar, honey syrup    _____________________________________________________________________             [facility] - Preparing for Surgery Before surgery, you can play an important role.  Because skin is not sterile, your skin needs to be as free of germs as possible.  You can reduce the number of germs on your skin by washing with CHG (chlorahexidine gluconate) soap before surgery.  CHG is an antiseptic cleaner which kills germs and bonds with the skin to continue killing germs even after washing. Please DO NOT use if you have an allergy to CHG or antibacterial soaps.  If your skin becomes reddened/irritated stop using the CHG and inform your nurse when you arrive at Short Stay. Do not shave (including legs and underarms) for at least 48 hours prior to the first CHG shower.  You may shave your face/neck. Please follow these instructions carefully:  1.  Shower with CHG Soap the night before surgery and the  morning of Surgery.  2.  If you choose to wash your hair, wash your hair first as usual with your  normal  shampoo.  3.  After you shampoo, rinse your hair and body thoroughly to remove the  shampoo.                            4.  Use CHG as you would any other liquid soap.  You can apply chg directly  to the skin and wash                       Gently with a scrungie or clean washcloth.  5.  Apply the CHG Soap to your body ONLY FROM THE NECK DOWN.   Do not use on face/ open                           Wound or  open sores. Avoid contact with eyes, ears mouth and genitals (private parts).                       Wash face,  Genitals (private parts) with your normal soap.             6.  Wash thoroughly, paying special attention to the area where your surgery  will be performed.  7.  Thoroughly rinse your body with warm water from the neck down.  8.  DO NOT shower/wash with your normal soap after using and rinsing off  the CHG Soap.  9.  Pat yourself dry with a clean towel.            10.  Wear clean pajamas.            11.  Place clean sheets on your bed the night of your first shower and do not  sleep with pets. Day of Surgery : Do not apply any lotions/deodorants the morning of surgery.  Please wear clean clothes to the hospital/surgery center.  FAILURE TO FOLLOW THESE INSTRUCTIONS MAY RESULT IN THE CANCELLATION OF YOUR SURGERY PATIENT SIGNATURE_________________________________  NURSE SIGNATURE__________________________________  ________________________________________________________________________

## 2018-09-08 ENCOUNTER — Encounter (HOSPITAL_COMMUNITY)
Admission: RE | Admit: 2018-09-08 | Discharge: 2018-09-08 | Disposition: A | Discharge status: Home / Self Care | Payer: BLUE CROSS/BLUE SHIELD | Source: Ambulatory Visit | Attending: Urology | Admitting: Urology

## 2018-09-08 ENCOUNTER — Other Ambulatory Visit: Payer: Self-pay

## 2018-09-08 ENCOUNTER — Encounter (HOSPITAL_COMMUNITY): Payer: Self-pay

## 2018-09-08 DIAGNOSIS — C679 Malignant neoplasm of bladder, unspecified: Secondary | ICD-10-CM | POA: Diagnosis not present

## 2018-09-08 DIAGNOSIS — Z01812 Encounter for preprocedural laboratory examination: Secondary | ICD-10-CM | POA: Insufficient documentation

## 2018-09-08 HISTORY — DX: Benign prostatic hyperplasia with lower urinary tract symptoms: N40.1

## 2018-09-08 HISTORY — DX: Other specified personal risk factors, not elsewhere classified: Z91.89

## 2018-09-08 HISTORY — DX: Personal history of (healed) traumatic fracture: Z87.81

## 2018-09-08 HISTORY — DX: Allergic rhinitis, unspecified: J30.9

## 2018-09-08 HISTORY — DX: Other chronic pain: G89.29

## 2018-09-08 HISTORY — DX: Low back pain, unspecified: M54.50

## 2018-09-08 HISTORY — DX: Malignant neoplasm of prostate: C79.89

## 2018-09-08 HISTORY — DX: Other forms of dyspnea: R06.09

## 2018-09-08 HISTORY — DX: Anesthesia of skin: R20.0

## 2018-09-08 HISTORY — DX: Mild persistent asthma, uncomplicated: J45.30

## 2018-09-08 HISTORY — DX: Malignant neoplasm of prostate: C61

## 2018-09-08 HISTORY — DX: Presence of spectacles and contact lenses: Z97.3

## 2018-09-08 HISTORY — DX: Personal history of pneumonia (recurrent): Z87.01

## 2018-09-08 HISTORY — DX: Other constipation: K59.09

## 2018-09-08 HISTORY — DX: Dyspnea, unspecified: R06.00

## 2018-09-08 HISTORY — DX: Low back pain: M54.5

## 2018-09-08 LAB — CBC
HCT: 40.6 % (ref 39.0–52.0)
Hemoglobin: 13.9 g/dL (ref 13.0–17.0)
MCH: 32 pg (ref 26.0–34.0)
MCHC: 34.2 g/dL (ref 30.0–36.0)
MCV: 93.3 fL (ref 80.0–100.0)
Platelets: 383 10*3/uL (ref 150–400)
RBC: 4.35 MIL/uL (ref 4.22–5.81)
RDW: 11.7 % (ref 11.5–15.5)
WBC: 11.4 10*3/uL — ABNORMAL HIGH (ref 4.0–10.5)
nRBC: 0 % (ref 0.0–0.2)

## 2018-09-08 LAB — BASIC METABOLIC PANEL
Anion gap: 11 (ref 5–15)
BUN: 10 mg/dL (ref 8–23)
CO2: 24 mmol/L (ref 22–32)
Calcium: 9.8 mg/dL (ref 8.9–10.3)
Chloride: 102 mmol/L (ref 98–111)
Creatinine, Ser: 0.81 mg/dL (ref 0.61–1.24)
GFR calc Af Amer: >60 mL/min (ref >60)
GFR calc non Af Amer: >60 mL/min (ref >60)
Glucose, Bld: 145 mg/dL — ABNORMAL HIGH (ref 70–99)
Potassium: 3.2 mmol/L — ABNORMAL LOW (ref 3.5–5.1)
Sodium: 137 mmol/L (ref 135–145)

## 2018-09-08 NOTE — Progress Notes (Signed)
EKG and CXR 2V dated 08-28-2018 in epic.

## 2018-09-08 NOTE — Progress Notes (Signed)
   09/08/18 1118  OBSTRUCTIVE SLEEP APNEA  Have you ever been diagnosed with sleep apnea through a sleep study? No  Do you snore loudly (loud enough to be heard through closed doors)?  1  Do you often feel tired, fatigued, or sleepy during the daytime (such as falling asleep during driving or talking to someone)? 0  Has anyone observed you stop breathing during your sleep? 1  Do you have, or are you being treated for high blood pressure? 1  BMI more than 35 kg/m2? 0  Age > 15 (1-yes) 1  Male Gender (Yes=1) 1  Obstructive Sleep Apnea Score 5

## 2018-09-12 ENCOUNTER — Telehealth: Payer: Self-pay

## 2018-09-12 ENCOUNTER — Inpatient Hospital Stay (HOSPITAL_COMMUNITY)
Admission: EM | Admit: 2018-09-12 | Discharge: 2018-09-22 | DRG: 988 | Disposition: A | Discharge status: Skilled Nursing Facility | Payer: BLUE CROSS/BLUE SHIELD | Attending: Internal Medicine | Admitting: Internal Medicine

## 2018-09-12 ENCOUNTER — Encounter (HOSPITAL_COMMUNITY): Payer: Self-pay

## 2018-09-12 ENCOUNTER — Emergency Department (HOSPITAL_COMMUNITY): Payer: BLUE CROSS/BLUE SHIELD

## 2018-09-12 ENCOUNTER — Other Ambulatory Visit: Payer: Self-pay

## 2018-09-12 DIAGNOSIS — Z8349 Family history of other endocrine, nutritional and metabolic diseases: Secondary | ICD-10-CM

## 2018-09-12 DIAGNOSIS — J4530 Mild persistent asthma, uncomplicated: Secondary | ICD-10-CM | POA: Diagnosis present

## 2018-09-12 DIAGNOSIS — G9340 Encephalopathy, unspecified: Secondary | ICD-10-CM | POA: Diagnosis present

## 2018-09-12 DIAGNOSIS — E876 Hypokalemia: Secondary | ICD-10-CM | POA: Diagnosis present

## 2018-09-12 DIAGNOSIS — C7951 Secondary malignant neoplasm of bone: Secondary | ICD-10-CM | POA: Diagnosis present

## 2018-09-12 DIAGNOSIS — B952 Enterococcus as the cause of diseases classified elsewhere: Secondary | ICD-10-CM | POA: Diagnosis present

## 2018-09-12 DIAGNOSIS — Z881 Allergy status to other antibiotic agents status: Secondary | ICD-10-CM

## 2018-09-12 DIAGNOSIS — M4856XA Collapsed vertebra, not elsewhere classified, lumbar region, initial encounter for fracture: Secondary | ICD-10-CM

## 2018-09-12 DIAGNOSIS — N390 Urinary tract infection, site not specified: Secondary | ICD-10-CM | POA: Diagnosis present

## 2018-09-12 DIAGNOSIS — G92 Toxic encephalopathy: Secondary | ICD-10-CM | POA: Diagnosis not present

## 2018-09-12 DIAGNOSIS — R441 Visual hallucinations: Secondary | ICD-10-CM

## 2018-09-12 DIAGNOSIS — M8580 Other specified disorders of bone density and structure, unspecified site: Secondary | ICD-10-CM | POA: Diagnosis present

## 2018-09-12 DIAGNOSIS — T402X5A Adverse effect of other opioids, initial encounter: Secondary | ICD-10-CM | POA: Diagnosis present

## 2018-09-12 DIAGNOSIS — Z79891 Long term (current) use of opiate analgesic: Secondary | ICD-10-CM

## 2018-09-12 DIAGNOSIS — Z7951 Long term (current) use of inhaled steroids: Secondary | ICD-10-CM

## 2018-09-12 DIAGNOSIS — R410 Disorientation, unspecified: Secondary | ICD-10-CM | POA: Diagnosis present

## 2018-09-12 DIAGNOSIS — M4850XA Collapsed vertebra, not elsewhere classified, site unspecified, initial encounter for fracture: Secondary | ICD-10-CM | POA: Diagnosis present

## 2018-09-12 DIAGNOSIS — C679 Malignant neoplasm of bladder, unspecified: Secondary | ICD-10-CM | POA: Diagnosis present

## 2018-09-12 DIAGNOSIS — Z888 Allergy status to other drugs, medicaments and biological substances status: Secondary | ICD-10-CM

## 2018-09-12 DIAGNOSIS — Z87891 Personal history of nicotine dependence: Secondary | ICD-10-CM

## 2018-09-12 DIAGNOSIS — G893 Neoplasm related pain (acute) (chronic): Secondary | ICD-10-CM | POA: Diagnosis present

## 2018-09-12 DIAGNOSIS — I10 Essential (primary) hypertension: Secondary | ICD-10-CM | POA: Diagnosis present

## 2018-09-12 DIAGNOSIS — N3001 Acute cystitis with hematuria: Secondary | ICD-10-CM | POA: Diagnosis present

## 2018-09-12 DIAGNOSIS — R52 Pain, unspecified: Secondary | ICD-10-CM

## 2018-09-12 DIAGNOSIS — Z515 Encounter for palliative care: Secondary | ICD-10-CM

## 2018-09-12 DIAGNOSIS — Z8546 Personal history of malignant neoplasm of prostate: Secondary | ICD-10-CM

## 2018-09-12 DIAGNOSIS — C7952 Secondary malignant neoplasm of bone marrow: Secondary | ICD-10-CM

## 2018-09-12 DIAGNOSIS — Z833 Family history of diabetes mellitus: Secondary | ICD-10-CM

## 2018-09-12 DIAGNOSIS — M4854XA Collapsed vertebra, not elsewhere classified, thoracic region, initial encounter for fracture: Secondary | ICD-10-CM | POA: Diagnosis present

## 2018-09-12 DIAGNOSIS — K5909 Other constipation: Secondary | ICD-10-CM | POA: Diagnosis present

## 2018-09-12 DIAGNOSIS — Z8261 Family history of arthritis: Secondary | ICD-10-CM

## 2018-09-12 DIAGNOSIS — Z823 Family history of stroke: Secondary | ICD-10-CM

## 2018-09-12 DIAGNOSIS — Z8249 Family history of ischemic heart disease and other diseases of the circulatory system: Secondary | ICD-10-CM

## 2018-09-12 DIAGNOSIS — Z8042 Family history of malignant neoplasm of prostate: Secondary | ICD-10-CM

## 2018-09-12 LAB — CBC WITH DIFFERENTIAL/PLATELET
Abs Immature Granulocytes: 0.07 10*3/uL (ref 0.00–0.07)
Basophils Absolute: 0 10*3/uL (ref 0.0–0.1)
Basophils Relative: 0 %
Eosinophils Absolute: 0.1 10*3/uL (ref 0.0–0.5)
Eosinophils Relative: 1 %
HCT: 38.5 % — ABNORMAL LOW (ref 39.0–52.0)
Hemoglobin: 13.1 g/dL (ref 13.0–17.0)
Immature Granulocytes: 1 %
Lymphocytes Relative: 23 %
Lymphs Abs: 2 10*3/uL (ref 0.7–4.0)
MCH: 32 pg (ref 26.0–34.0)
MCHC: 34 g/dL (ref 30.0–36.0)
MCV: 93.9 fL (ref 80.0–100.0)
Monocytes Absolute: 0.8 10*3/uL (ref 0.1–1.0)
Monocytes Relative: 9 %
Neutro Abs: 5.8 10*3/uL (ref 1.7–7.7)
Neutrophils Relative %: 66 %
Platelets: 264 10*3/uL (ref 150–400)
RBC: 4.1 MIL/uL — ABNORMAL LOW (ref 4.22–5.81)
RDW: 11.9 % (ref 11.5–15.5)
WBC: 8.7 10*3/uL (ref 4.0–10.5)
nRBC: 0 % (ref 0.0–0.2)

## 2018-09-12 NOTE — Telephone Encounter (Signed)
Received call from patient spouse stating that she started the MS Contin as directed and has been able to cut back to the breakthrough pain medication. She stated that the patient was sedated yesterday and started to hallucinate so she did not give him any of the breakthrough medication. She stated that he is doing a little better this morning. She is able to have a conversation with the patient, he is eating and drinking, his speech is not slurred but she stated that he is still sleeping a lot which she feels is related to the dose of MS contin and feels that it may be too much for the patient. She stated that she is comfortable monitoring the patient for today and understands to go to the ED for any emergencies. Will follow up with Dr. Alen Blew when he returns tomorrow and will follow up with patient spouse. Juliann Pulse was appreciative and agreeable with the plan.

## 2018-09-12 NOTE — Telephone Encounter (Signed)
10:08 information wife shared with Triage.   "[name] Holder wife [phone]).  He iis overly sedated.  Medication started last Monday morphine ER 30 mg twice daily.  He only takes one half to one tablet of the Oxycodone; was on this before the Morphine tolerating it fine.  He's having memory and cognitive problems.  Hallucinating seeing black and white cows and other things that aren't there."

## 2018-09-12 NOTE — ED Provider Notes (Signed)
[facility] DEPT Provider Note   CSN: [id number] Arrival date & time: 09/12/18  2126    History   Chief Complaint Chief Complaint  Patient presents with  . Back Pain  . Medication Reaction    HPI YEISON SIPPEL is a 63 y.o. 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  The patient's wife reports sudden onset visual hallucinations that have been waxing and waning since yesterday.  She reports associated waxing and waning agitation.  She reports that yesterday the patient looked outside and told her that he was seeing black and white towels out of the lawn.  Later, he told her he was saying been putting up tense holding rifles out on the line.  Today, he endorsed seeing a person's face on the family dog and stated that he saw large amounts of dust dripping off her arm while they were talking.  She reports that earlier today he became agitated and did not recognize her despite being married for the last 40 years.  She confused him with her sister.  She states that she felt as if the visual hallucinations were worse yesterday and present this morning, but not as severe.  She reports this afternoon between 2 PM and 6 PM that he was at his baseline and was not having any hallucinations, but at 6 PM his symptoms significantly worsened, which is when he developed the agitation.  He denies visual hallucinations.  She reports she spoke with his oncologist yesterday when the hallucinations began and he is scheduled for an appointment in the clinic tomorrow. She reports he was started on MS Contin 1 week ago.  His last dose was at 8 AM this morning.  She reports his pain was much more controlled since starting the medication over the last week.  He was able to cut  back on his home oxycodone.  No other recent medication changes.   She reports he is also been more drowsy since yesterday.  She also reports that she has noted twitching in his hands and feet over the last week.  No numbness or weakness, but she reports the patient has spent most of his days in bed for the last 4 weeks.   She also reports he is on Duoneb nebulizer treatment every 4 hours at home.  She reports this started after he was previously on fentanyl patches when he had worsening shortness of breath and she would no wheezing with positional changes.  She reports the shortness of breath worsened again last week when he was started on the MS Contin. Yesterday, the patient reports that he felt a "pop" in his right ribs and he has been having chest pain with moving over the last day.  He reports a history of broken ribs previously, but states this feels different.  She reports he has not been eating and drinking as well.  No fever or chills.  No urinary complaints, but she notes that his urine has been more concentrated.  She denies cough, abdominal pain, nausea, vomiting, or diarrhea.  He denies headaches, new numbness or weakness.  He reports his bilateral vision has been slightly more blurred over the last few days.  No diplopia, neck pain, or stiffness, seizure-like activity, or syncope.  She reports his last bowel movement was 1 week ago.  She  has been giving him MiraLAX and stool softeners and increasing his fiber intake without improvement in his symptoms.  No history of bowel obstruction.  He is scheduled for a transurethral resection of bladder tumor on 2/20 with Dr. Alinda Money.   He was seen by his PCP and was on a prednisone 6-day taper on January 24.    He is followed by Dr. Alen Blew oncology.  He has castration resistant disease since 2018.  He is on Zytiga and chronic prednisone.     The history is provided by the patient. No language interpreter was used.    Past Medical History:   Diagnosis Date  . Allergic rhinitis   . At risk for sleep apnea    STOP-BANG SCORE = 5    (routed to pt's pcp 09-08-2018)  . Chronic constipation   . Chronic low back pain   . Chronic pain   . DOE (dyspnea on exertion)   . History of kidney stones   . History of recent pneumonia 08/10/2018   acute bronchopneumonia;  follow-up cxr 08-28-2018 in epic  . History of vertebral compression fracture    T10 and T12 s/p kyphoplasty  . Hx of colonic polyps   . Hyperplasia of prostate with lower urinary tract symptoms (LUTS)   . Hypertension   . Mild persistent asthma    followed by pcp  . Numbness in both legs    secondary to a fall, per pt has had work-up and couldn't find anything  . Osteopenia   . Prostate cancer metastatic to pelvis [facility]) urologist-- dr borden/  oncologist -- dr Alen Blew   dx 2014 advanced prostate cancer with pelvid adenopathy,  Stage T2c, Gleason 7;  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  . Wears glasses     Patient Active Problem List   Diagnosis Date Noted  . Pathologic compression fracture of spine, initial encounter ([place]) 09/13/2018  . Delirium 09/13/2018  . Acute lower UTI 09/13/2018  . Cancer associated pain 09/13/2018  . Bladder cancer ([place]) 09/13/2018  . Acute encephalopathy 09/13/2018  . Secondary malignant neoplasm of bone and bone marrow ([place]) 08/06/2018  . Spinal stenosis in cervical region 04/12/2018  . Weakness 04/11/2018  . Elevated LFTs 09/16/2017  . Routine general medical examination at a health care facility 08/29/2015  . Hyperlipidemia with target LDL less than 100 12/21/2013  . Other abnormal glucose 12/21/2013  . Upper airway cough syndrome 07/25/2013  . History of prostate cancer   . Essential hypertension 05/20/2010  . Allergic rhinitis 05/20/2010  . Asthma, mild intermittent 05/20/2010    Past Surgical History:  Procedure Laterality Date  . COLONOSCOPY    . IR KYPHO EA ADDL LEVEL THORACIC OR LUMBAR  08/17/2018  . IR KYPHO EA ADDL LEVEL  THORACIC OR LUMBAR  08/26/2018  . IR KYPHO LUMBAR INC FX REDUCE BONE BX UNI/BIL CANNULATION INC/IMAGING  08/26/2018  . IR KYPHO THORACIC WITH BONE BIOPSY  08/17/2018  . IR RADIOLOGIST EVAL & MGMT  08/11/2018  . IR RADIOLOGIST EVAL & MGMT  08/24/2018  . LYMPHADENECTOMY Left 01/05/2013   Procedure: ROBOTIC LYMPHADENECTOMY;  Surgeon: Dutch Gray, MD;  Location: [facility] OR[facility];  Service: Urology;  Laterality: Left;  . PROSTATE BIOPSY     11/12 positive biopsies  . ROTATOR CUFF REPAIR Left 2012  . UMBILICAL HERNIA REPAIR  09-02-2001   dr t. price _0         Home Medications    Prior to Admission medications   Medication Sig Start Date End  Date Taking? Authorizing Provider  abiraterone acetate (ZYTIGA) 250 MG tablet Take 1,000 mg by mouth daily.  08/17/17  Yes [provider]  albuterol (PROAIR HFA) 108 (90 Base) MCG/ACT inhaler Inhale 2 puffs into the lungs every 6 (six) hours as needed for wheezing or shortness of breath. 08/10/18  Yes Caccavale, Sophia, PA-C  albuterol (PROVENTIL) (2.5 MG/3ML) 0.083% nebulizer solution Take 3 mLs (2.5 mg total) by nebulization every 6 (six) hours as needed for wheezing or shortness of breath. 08/15/18  Yes Marrian Salvage, FNP  azelastine (ASTELIN) 0.1 % nasal spray Place 2 sprays into both nostrils every morning. Patient taking differently: Place 2 sprays into both nostrils daily as needed for allergies.  04/12/18  Yes Johnson, Clanford L, MD  CALCIUM-VITAMIN D PO Take 1 tablet by mouth 2 (two) times daily.    Yes [provider]  carvedilol (COREG) 3.125 MG tablet Take 1 tablet (3.125 mg total) by mouth 2 (two) times daily with a meal. 09/05/18  Yes [name], MD  cyanocobalamin (,VITAMIN B-12,) 1000 MCG/ML injection Inject 1,000 mcg into the muscle every 30 (thirty) days.  05/14/18  Yes [provider]  cyclobenzaprine (FLEXERIL) 5 MG tablet Take 5 mg by mouth 3 (three) times daily as needed for muscle spasms.  05/16/18  Yes [provider]  docusate sodium (COLACE) 100 MG capsule Take 100 mg by mouth 2 (two) times daily.    Yes [provider]  ipratropium-albuterol (DUONEB) 0.5-2.5 (3) MG/3ML SOLN Take 3 mLs by nebulization every 4 (four) hours as needed. 08/19/18  Yes Marrian Salvage, FNP  Leuprolide Acetate (LUPRON IJ) Inject 1 Dose as directed every 4 (four) months. This is administered by [facility]   Yes [provider]  magnesium citrate SOLN Take 1 Bottle by mouth as needed for severe constipation.   Yes [provider]  methocarbamol (ROBAXIN) 500 MG tablet Take 500 mg by mouth 2 (two) times daily.    Yes [provider]  mometasone (NASONEX) 50 MCG/ACT nasal spray USE 1 TO 2 SPRAYS IN EACH NOSTRIL ONCE DAILY. Patient taking differently: Place 2 sprays into the nose daily as needed (allergies).  09/16/17  Yes [name], MD  morphine (MS CONTIN) 30 MG 12 hr tablet Take 1 tablet (30 mg total) by mouth every 12 (twelve) hours. 09/05/18  Yes Wyatt Portela, MD  NARCAN 4 MG/0.1ML LIQD nasal spray kit Place 0.4 mg into the nose once.  08/18/18  Yes [provider]  olmesartan (BENICAR) 40 MG tablet TAKE 1 TABLET DAILY 08/08/18  Yes [name], MD  oxyCODONE (OXY IR/ROXICODONE) 5 MG immediate release tablet Take 1 tablet (5 mg total) by mouth every 4 (four) hours as needed for severe pain (may take 1-2 tablets every 4 hours). Patient taking differently: Take 5-10 mg by mouth every 4 (four) hours as needed for severe pain (may take 1-2 tablets every 4 hours).  09/05/18  Yes Wyatt Portela, MD  Polyethylene Glycol 3350 (MIRALAX PO) Take 1 Dose by mouth daily as needed (constipation).    Yes [provider]  predniSONE (DELTASONE) 5 MG tablet Take 8 tablets (40 mg total) by mouth daily. Taper as directed Patient taking differently: Take 40 mg by mouth 2 (two) times daily. Takes while on Zytiga 08/28/18  Yes Hayden Rasmussen, MD  SYMBICORT 160-4.5  MCG/ACT inhaler Inhale 2 puffs into the lungs 2 (two) times daily. Patient taking differently: Inhale 2 puffs  into the lungs 2 (two) times daily.  08/19/18  Yes [name], MD  SYRINGE-NEEDLE, DISP, 3 ML 3 ML MISC Use to inject b12 monthly. 04/19/18   [name], MD    Family History Family History  Problem Relation Age of Onset  . Arthritis Mother   . Diabetes Mother   . Hypertension Mother   . Hyperlipidemia Mother   . Hypertension Father   . Prostate cancer Father        radiation in mid 35s  . Heart attack Father   . Cancer Sister        precancerous colon polyps, 6 in colon removed  . Stroke Maternal Grandfather     Social History Social History   Tobacco Use  . Smoking status: Former Smoker    Packs/day: 0.50    Years: 25.00    Pack years: 12.50    Types: Cigarettes    Last attempt to quit: 09/08/2012    Years since quitting: 6.0  . Smokeless tobacco: Never Used  Substance Use Topics  . Alcohol use: Not Currently    Alcohol/week: 2.0 standard drinks    Types: 2 Glasses of wine per week    Comment: wine  1  glasses daily  . Drug use: Never     Allergies   Amlodipine and Tetracycline   Review of Systems Review of Systems  Constitutional: Negative for appetite change, chills and fever.  HENT: Negative for congestion, ear pain, sinus pressure, sinus pain and tinnitus.   Eyes: Positive for visual disturbance (blurred).  Respiratory: Positive for shortness of breath and wheezing.   Cardiovascular: Positive for chest pain.  Gastrointestinal: Positive for constipation. Negative for abdominal pain, diarrhea, nausea and vomiting.  Genitourinary: Negative for dysuria, hematuria and urgency.  Musculoskeletal: Negative for back pain, myalgias, neck pain and neck stiffness.  Skin: Negative for rash.  Allergic/Immunologic: Negative for immunocompromised state.  Neurological: Negative for dizziness, syncope, weakness, numbness and headaches.   Psychiatric/Behavioral: Positive for agitation and hallucinations. Negative for confusion.     Physical Exam Updated Vital Signs BP 140/81 (BP Location: Right Arm)   Pulse (!) 102   Temp 98.2 F (36.8 C) (Oral)   Resp 18   Ht 5' 7" (1.702 m)   Wt 78.4 kg   SpO2 99%   BMI 27.08 kg/m   Physical Exam Vitals signs and nursing note reviewed.  Constitutional:      General: He is not in acute distress.    Appearance: He is well-developed. He is not ill-appearing, toxic-appearing or diaphoretic.     Comments: Chronically ill-appearing male  HENT:     Head: Normocephalic.     Nose: Nose normal.     Mouth/Throat:     Mouth: Mucous membranes are dry.  Eyes:     Extraocular Movements: Extraocular movements intact.     Conjunctiva/sclera: Conjunctivae normal.     Pupils: Pupils are equal, round, and reactive to light.  Neck:     Musculoskeletal: Normal range of motion and neck supple. No neck rigidity.     Comments: No meningismus Cardiovascular:     Rate and Rhythm: Regular rhythm. Tachycardia present.     Heart sounds: No murmur. No friction rub.  Pulmonary:     Effort: Pulmonary effort is normal. No respiratory distress.     Breath sounds: No stridor. No wheezing, rhonchi or rales.  Abdominal:     General: There is no distension.     Palpations: Abdomen is  soft.     Tenderness: There is abdominal tenderness. There is no right CVA tenderness, guarding or rebound.     Hernia: No hernia is present.     Comments: Abdomen is soft, nondistended.  CVA tenderness on the left.  No right CVA tenderness.  Normoactive bowel sounds in all 4 quadrants.  Musculoskeletal:     Right lower leg: No edema.     Left lower leg: No edema.     Comments: Diffusely tender to palpation to the thoracic and lumbar spinous processes.  No tenderness to the spinous processes of the cervical spine.  No obvious crepitus or step-offs.  Skin:    General: Skin is warm and dry.  Neurological:     Mental  Status: He is alert.     Comments: He is oriented to person, time, and place.  He can correctly identify family members by name and relationship with the room.  He answers questions appropriately, but gets confused with telling details of recent events.  Moves all 4 extremities.  GCS 15.  5-5 strength against resistance of the bilateral upper and lower extremities.  Sensation is intact and equal.  Unable to perform finger-to-nose as patient cannot follow commands.  He is able to touch his nose to his finger, but when asked to touch his finger to my finger, he attempts to touch my nose.  Gait deferred at this time.  Psychiatric:        Attention and Perception: He perceives visual hallucinations. He does not perceive auditory hallucinations.        Thought Content: Thought content does not include homicidal or suicidal ideation. Thought content does not include homicidal or suicidal plan.     Comments: Actively visually hallucinating during the interview.  He knows to see larger bugs crawling on the metal rods on the wall of the room.      ED Treatments / Results  Labs (all labs ordered are listed, but only abnormal results are displayed) Labs Reviewed  CBC WITH DIFFERENTIAL/PLATELET - Abnormal; Notable for the following components:      Result Value   RBC 4.10 (*)    HCT 38.5 (*)    All other components within normal limits  COMPREHENSIVE METABOLIC PANEL - Abnormal; Notable for the following components:   Potassium 2.7 (*)    Glucose, Bld 105 (*)    BUN 7 (*)    Total Protein 5.7 (*)    ALT 56 (*)    Alkaline Phosphatase 139 (*)    All other components within normal limits  URINALYSIS, ROUTINE W REFLEX MICROSCOPIC - Abnormal; Notable for the following components:   Color, Urine AMBER (*)    APPearance CLOUDY (*)    Hgb urine dipstick MODERATE (*)    Protein, ur 30 (*)    Leukocytes,Ua LARGE (*)    WBC, UA >50 (*)    Bacteria, UA FEW (*)    All other components within normal limits   POTASSIUM - Abnormal; Notable for the following components:   Potassium 3.1 (*)    All other components within normal limits  URINE CULTURE  TSH  TROPONIN I  LACTIC ACID, PLASMA    EKG None  Radiology Ct Head W Or Wo Contrast  Result Date: 09/13/2018 CLINICAL DATA:  Initial evaluation for hallucinations. History of metastatic prostate cancer. EXAM: CT HEAD WITHOUT AND WITH CONTRAST TECHNIQUE: Contiguous axial images were obtained from the base of the skull through the vertex without and with intravenous  contrast CONTRAST:  186m ISOVUE-370 IOPAMIDOL (ISOVUE-370) INJECTION 76% COMPARISON:  Prior MRI from 04/11/2018 FINDINGS: Brain: Examination limited by motion artifact. Generalized age-related cerebral atrophy. No acute intracranial hemorrhage. No acute large vessel territory infarct. No mass lesion, midline shift or mass effect. No hydrocephalus. No extra-axial fluid collection. No appreciable abnormal enhancement. Vascular: No hyperdense vessel. Scattered vascular calcifications noted within the carotid siphons. Normal intravascular enhancement seen throughout the intracranial vasculature following contrast administration. Skull: Scalp soft tissues demonstrate no acute finding. Calvarium intact. No definite focal osseous lesions on this limited motion degraded exam. Corticated lucency extending through the anterior arch of C1, chronic in appearance, likely congenital. Sinuses/Orbits: Globes and orbital soft tissues within normal limits. Paranasal sinuses and mastoid air cells are grossly clear. Other: None. IMPRESSION: 1. Technically limited exam due to extensive motion artifact. 2. Grossly negative head CT with and without contrast. No acute intracranial abnormality. No mass lesion or abnormal enhancement. Electronically Signed   By: BJeannine BogaM.D.   On: 09/13/2018 02:17   Ct Angio Chest Pe W And/or Wo Contrast  Result Date: 09/13/2018 CLINICAL DATA:  63year old male with  shortness of breath, possible small bowel obstruction. Metastatic prostate cancer. EXAM: CT ANGIOGRAPHY CHEST CT ABDOMEN AND PELVIS WITH CONTRAST TECHNIQUE: Multidetector CT imaging of the chest was performed using the standard protocol during bolus administration of intravenous contrast. Multiplanar CT image reconstructions and MIPs were obtained to evaluate the vascular anatomy. Multidetector CT imaging of the abdomen and pelvis was performed using the standard protocol during bolus administration of intravenous contrast. CONTRAST:  1053mISOVUE-370 IOPAMIDOL (ISOVUE-370) INJECTION 76% COMPARISON:  Head CT today reported separately. Chest CTA 08/10/2018. CT Abdomen and Pelvis 07/28/2018. FINDINGS: CTA CHEST FINDINGS Cardiovascular: Good contrast bolus timing in the pulmonary arterial tree. Respiratory motion. No central pulmonary embolus. The upper lobe segmental branches also appear patent. The lower lobe branches are poorly evaluated due to motion. No cardiomegaly or pericardial effusion. Stable mild tortuosity of the thoracic aorta. Mediastinum/Nodes: No lymphadenopathy. Lungs/Pleura: Major airways are patent. Chronic atelectasis or scarring in the left lower lung. No pleural effusion or acute pulmonary opacity. Musculoskeletal: Bone detail substantially degraded by motion at some levels. The T10 compression fracture has been augmented. There are new T8 and T9 moderate compression fractures (series 27, image 77). There is a new nonacute appearing anterior left lower rib fracture with callus on series 25, image 114. Review of the MIP images confirms the above findings. CT ABDOMEN and PELVIS FINDINGS Intermittent motion artifact. Hepatobiliary: Stable and negative. Pancreas: Negative. Spleen: Negative. Adrenals/Urinary Tract: Adrenal glands and kidneys appear stable. Renal enhancement and contrast excretion appears symmetric and normal. There are small bilateral benign appearing renal cysts. Urinary bladder  detail today is degraded by motion. Stomach/Bowel: Increased retained stool in the large bowel. Redundant sigmoid colon. No large bowel inflammation. Gas and fluid-filled small bowel loops measure at the upper limits of normal, 28 millimeters. Some detail is degraded by motion but there is no small bowel transition identified. The stomach is decompressed.  No free air, free fluid. Vascular/Lymphatic: Aortoiliac calcified atherosclerosis. Major arterial structures appear to remain patent. Grossly patent portal venous system. No lymphadenopathy. Reproductive: Negative. Other: No pelvic free fluid. Musculoskeletal: Bone detail intermittently degraded by motion. Augmented T12, L1, and L2 compression fractures. Mild L3 superior endplate compression since 07/28/2018. Review of the MIP images confirms the above findings. IMPRESSION: 1. Motion degraded CTA of the chest and CT Abdomen and Pelvis. 2. No central pulmonary embolus, but  pulmonary artery branch detail is degraded by motion. 3. New compression fractures of T8, T9, and L3 since January which may be pathologic fractures in the setting of prostate cancer. Interval augmentation of the T10, T12, L1 and L2 fractures. 4. No other new abnormality identified in the chest, abdomen, or pelvis. Electronically Signed   By: Genevie Ann M.D.   On: 09/13/2018 02:36   Ct Abdomen Pelvis W Contrast  Result Date: 09/13/2018 CLINICAL DATA:  63 year old male with shortness of breath, possible small bowel obstruction. Metastatic prostate cancer. EXAM: CT ANGIOGRAPHY CHEST CT ABDOMEN AND PELVIS WITH CONTRAST TECHNIQUE: Multidetector CT imaging of the chest was performed using the standard protocol during bolus administration of intravenous contrast. Multiplanar CT image reconstructions and MIPs were obtained to evaluate the vascular anatomy. Multidetector CT imaging of the abdomen and pelvis was performed using the standard protocol during bolus administration of intravenous contrast.  CONTRAST:  134m ISOVUE-370 IOPAMIDOL (ISOVUE-370) INJECTION 76% COMPARISON:  Head CT today reported separately. Chest CTA 08/10/2018. CT Abdomen and Pelvis 07/28/2018. FINDINGS: CTA CHEST FINDINGS Cardiovascular: Good contrast bolus timing in the pulmonary arterial tree. Respiratory motion. No central pulmonary embolus. The upper lobe segmental branches also appear patent. The lower lobe branches are poorly evaluated due to motion. No cardiomegaly or pericardial effusion. Stable mild tortuosity of the thoracic aorta. Mediastinum/Nodes: No lymphadenopathy. Lungs/Pleura: Major airways are patent. Chronic atelectasis or scarring in the left lower lung. No pleural effusion or acute pulmonary opacity. Musculoskeletal: Bone detail substantially degraded by motion at some levels. The T10 compression fracture has been augmented. There are new T8 and T9 moderate compression fractures (series 27, image 77). There is a new nonacute appearing anterior left lower rib fracture with callus on series 25, image 114. Review of the MIP images confirms the above findings. CT ABDOMEN and PELVIS FINDINGS Intermittent motion artifact. Hepatobiliary: Stable and negative. Pancreas: Negative. Spleen: Negative. Adrenals/Urinary Tract: Adrenal glands and kidneys appear stable. Renal enhancement and contrast excretion appears symmetric and normal. There are small bilateral benign appearing renal cysts. Urinary bladder detail today is degraded by motion. Stomach/Bowel: Increased retained stool in the large bowel. Redundant sigmoid colon. No large bowel inflammation. Gas and fluid-filled small bowel loops measure at the upper limits of normal, 28 millimeters. Some detail is degraded by motion but there is no small bowel transition identified. The stomach is decompressed.  No free air, free fluid. Vascular/Lymphatic: Aortoiliac calcified atherosclerosis. Major arterial structures appear to remain patent. Grossly patent portal venous system. No  lymphadenopathy. Reproductive: Negative. Other: No pelvic free fluid. Musculoskeletal: Bone detail intermittently degraded by motion. Augmented T12, L1, and L2 compression fractures. Mild L3 superior endplate compression since 07/28/2018. Review of the MIP images confirms the above findings. IMPRESSION: 1. Motion degraded CTA of the chest and CT Abdomen and Pelvis. 2. No central pulmonary embolus, but pulmonary artery branch detail is degraded by motion. 3. New compression fractures of T8, T9, and L3 since January which may be pathologic fractures in the setting of prostate cancer. Interval augmentation of the T10, T12, L1 and L2 fractures. 4. No other new abnormality identified in the chest, abdomen, or pelvis. Electronically Signed   By: HGenevie AnnM.D.   On: 09/13/2018 02:36   Dg Abdomen Acute W/chest  Result Date: 09/12/2018 CLINICAL DATA:  Right-sided rib pain EXAM: DG ABDOMEN ACUTE W/ 1V CHEST COMPARISON:  08/28/2018 FINDINGS: Single-view chest demonstrates platelike atelectasis in the left mid lung with streaky atelectasis at the left base. Stable cardiomediastinal  silhouette. Supine and decubitus views of the abdomen demonstrate no definite free air. Mildly dilated loops of central small bowel measuring up to 3 cm with scattered distal bowel gas noted. Scoliosis of the spine with multiple treated compression fractures. IMPRESSION: 1. Low lung volumes with streaky atelectasis at the left mid to lower lung. 2. Mild air distention of central small bowel with distal gas present, findings could be secondary to ileus or partial bowel obstruction. Electronically Signed   By: Donavan Foil M.D.   On: 09/12/2018 23:44    Procedures Procedures (including critical care time)  Medications Ordered in ED Medications  iopamidol (ISOVUE-370) 76 % injection (has no administration in time range)  0.9 %  sodium chloride infusion ( Intravenous Transfusing/Transfer 09/13/18 0430)  docusate sodium (COLACE) capsule 100  mg (has no administration in time range)  carvedilol (COREG) tablet 3.125 mg (has no administration in time range)  mometasone-formoterol (DULERA) 200-5 MCG/ACT inhaler 2 puff (has no administration in time range)  irbesartan (AVAPRO) tablet 300 mg (has no administration in time range)  magnesium citrate solution 1 Bottle (has no administration in time range)  methocarbamol (ROBAXIN) tablet 500 mg (has no administration in time range)  oxyCODONE (Oxy IR/ROXICODONE) immediate release tablet 5-10 mg (10 mg Oral Given 09/13/18 0559)  oxyCODONE (OXYCONTIN) 12 hr tablet 15 mg (has no administration in time range)  predniSONE (DELTASONE) tablet 5 mg (has no administration in time range)  acetaminophen (TYLENOL) tablet 650 mg (has no administration in time range)    Or  acetaminophen (TYLENOL) suppository 650 mg (has no administration in time range)  ondansetron (ZOFRAN) tablet 4 mg (has no administration in time range)    Or  ondansetron (ZOFRAN) injection 4 mg (has no administration in time range)  enoxaparin (LOVENOX) injection 40 mg (has no administration in time range)  cefTRIAXone (ROCEPHIN) 1 g in sodium chloride 0.9 % 100 mL IVPB (has no administration in time range)  albuterol (PROVENTIL) (2.5 MG/3ML) 0.083% nebulizer solution 2.5 mg (has no administration in time range)  ipratropium-albuterol (DUONEB) 0.5-2.5 (3) MG/3ML nebulizer solution 3 mL (has no administration in time range)  fentaNYL (SUBLIMAZE) injection 50 mcg (50 mcg Intravenous Given 09/13/18 0114)  potassium chloride 10 mEq in 100 mL IVPB (0 mEq Intravenous Stopped 09/13/18 0430)  potassium chloride SA (K-DUR,KLOR-CON) CR tablet 40 mEq (40 mEq Oral Given 09/13/18 0105)  sodium chloride (PF) 0.9 % injection (10 mLs  Given 09/13/18 0238)  iopamidol (ISOVUE-370) 76 % injection 100 mL (100 mLs Intravenous Contrast Given 09/13/18 0112)  cefTRIAXone (ROCEPHIN) 1 g in sodium chloride 0.9 % 100 mL IVPB (0 g Intravenous Stopped 09/13/18 0251)   fentaNYL (SUBLIMAZE) injection 75 mcg (75 mcg Intravenous Given 09/13/18 0228)  sodium chloride 0.9 % bolus 1,000 mL (0 mLs Intravenous Stopped 09/13/18 0339)     Initial Impression / Assessment and Plan / ED Course  I have reviewed the triage vital signs and the nursing notes.  Pertinent labs & imaging results that were available during my care of the patient were reviewed by me and considered in my medical decision making (see chart for details).        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  He has not had a bowel movement in 1 week.  Patient was  started on MS Contin 1 week ago.  Abdominal x-ray with low lung volumes with streaky atelectasis at the left mid to lower lung and mild air distention concerning for ileus or partial bowel obstruction.  Given that he is also tachycardic and has been sedentary due to previous thoracic and lumbar fractures, we will also order PE study and CT head since the patient is now having new hallucinations.  CT head is unremarkable.  PE study is negative for PE, but demonstrates new compression fractures of T8, T9, and L3 that are new since January.  Likely pathologic fractures in the setting of prostate cancer.  Alkaline phosphatase is elevated at 139, which could be due to acute fractures.  UA is also concerning for infection.  Urine culture sent.  Rocephin given in the ER.  Hypokalemia at 2.7.  IV and oral potassium chloride replenished in the ER.  No previous urine cultures available for review of culture and sensitivity.  The patient has required multiple doses of fentanyl for pain control in the ER.  His hallucinations have continued to improved over the last few hours as he has been observed.  I suspect  his hallucinations may be due to UTI, but given their acute onset and need for pain control with multiple new compression fractures, I feel that the patient warrants admission for further work-up and evaluation.  The patient and his wife are agreeable to the plan at this time.  Consult to the hospitalist team spoke with Dr. Alcario Drought who will accept the patient for admission. The patient appears reasonably stabilized for admission considering the current resources, flow, and capabilities available in the ED at this time, and I doubt any other [facility] requiring further screening and/or treatment in the ED prior to admission.  Final Clinical Impressions(s) / ED Diagnoses   Final diagnoses:  Hallucinations, visual  Acute cystitis with hematuria  Non-traumatic compression fracture of eighth thoracic vertebra, initial encounter ([place])  Non-traumatic compression fracture of ninth thoracic vertebra, initial encounter ([place])  Non-traumatic compression fracture of third lumbar vertebra, initial encounter [facility])    ED Discharge Orders    None       Joanne Gavel, PA-C 09/13/18 0747    Lacretia Leigh, MD 09/14/18 1527

## 2018-09-12 NOTE — ED Triage Notes (Signed)
Due to chronic back pain, they have changed his pain medications. After taking MS Cotin, wife reports hallucinations and agitation. Pain currently uncontrolled.

## 2018-09-12 NOTE — Anesthesia Preprocedure Evaluation (Deleted)
Anesthesia Evaluation  Patient identified by MRN, date of birth, ID band Patient awake    Reviewed: Allergy & Precautions, H&P , NPO status , Patient's Chart, lab work & pertinent test results, reviewed documented beta blocker date and time   Airway Mallampati: II  TM Distance: >3 FB Neck ROM: full    Dental no notable dental hx.    Pulmonary asthma , former smoker,    Pulmonary exam normal breath sounds clear to auscultation       Cardiovascular Exercise Tolerance: Good hypertension, Pt. on medications negative cardio ROS   Rhythm:regular Rate:Normal     Neuro/Psych negative neurological ROS  negative psych ROS   GI/Hepatic negative GI ROS, Neg liver ROS,   Endo/Other  negative endocrine ROS  Renal/GU negative Renal ROS  negative genitourinary   Musculoskeletal   Abdominal   Peds  Hematology negative hematology ROS (+)   Anesthesia Other Findings   Reproductive/Obstetrics negative OB ROS                                                             Anesthesia Evaluation  Patient identified by MRN, date of birth, ID band Patient awake    Reviewed: Allergy & Precautions, H&P , NPO status , Patient's Chart, lab work & pertinent test results  Airway Mallampati: II TM Distance: >3 FB Neck ROM: Full    Dental no notable dental hx.    Pulmonary asthma , Current Smoker,  breath sounds clear to auscultation  Pulmonary exam normal       Cardiovascular hypertension, Pt. on medications Rhythm:Regular Rate:Normal     Neuro/Psych negative neurological ROS  negative psych ROS   GI/Hepatic negative GI ROS, Neg liver ROS,   Endo/Other  negative endocrine ROS  Renal/GU negative Renal ROS  negative genitourinary   Musculoskeletal negative musculoskeletal ROS (+)   Abdominal   Peds negative pediatric ROS (+)  Hematology negative hematology ROS (+)   Anesthesia  Other Findings   Reproductive/Obstetrics negative OB ROS                         Anesthesia Physical Anesthesia Plan  ASA: II  Anesthesia Plan: General   Post-op Pain Management:    Induction: Intravenous  Airway Management Planned: Oral ETT  Additional Equipment:   Intra-op Plan:   Post-operative Plan: Extubation in OR  Informed Consent: I have reviewed the patients History and Physical, chart, labs and discussed the procedure including the risks, benefits and alternatives for the proposed anesthesia with the patient or authorized representative who has indicated his/her understanding and acceptance.   Dental advisory given  Plan Discussed with: CRNA  Anesthesia Plan Comments:         Anesthesia Quick Evaluation  Anesthesia Physical Anesthesia Plan  ASA: III  Anesthesia Plan: General   Post-op Pain Management:    Induction: Intravenous  PONV Risk Score and Plan: 2 and Ondansetron, Treatment may vary due to age or medical condition, Dexamethasone and Midazolam  Airway Management Planned: Oral ETT  Additional Equipment:   Intra-op Plan:   Post-operative Plan: Extubation in OR  Informed Consent: I have reviewed the patients History and Physical, chart, labs and discussed the procedure including the risks, benefits and alternatives for the proposed anesthesia with  the patient or authorized representative who has indicated his/her understanding and acceptance.     Dental Advisory Given  Plan Discussed with: CRNA, Anesthesiologist and Surgeon  Anesthesia Plan Comments: (See PST note 09/08/18, Konrad Felix, PA-C)       Anesthesia Quick Evaluation

## 2018-09-12 NOTE — Progress Notes (Signed)
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 Raynelle Bring.   Pt last seen by oncologist, Dr. Zola Button, on 09/05/2018.  Per his note therapy remains palliative and prostate disease is incurable.     Pt dx 08/10/18 with acute PNA. Chest xray 08/28/18 with no acute findings.  He has continued with duoneb prn, asymptomatic at PST visit 09/15/2018.   Pt can proceed with planned procedure barring acute status change.  VS: BP 110/60   Pulse (!) 102   Temp 36.7 C (Oral)   Resp 18   Ht '5\' 8"'$  (1.727 m)   Wt 79 kg   SpO2 100%   BMI 26.48 kg/m   PROVIDERS: [name], MD is PCP   Zola Button, MD is Oncologist  LABS: Labs reviewed: Acceptable for surgery. (all labs ordered are listed, but only abnormal results are displayed)  Labs Reviewed  BASIC METABOLIC PANEL - Abnormal; Notable for the following components:      Result Value   Potassium 3.2 (*)    Glucose, Bld 145 (*)    All other components within normal limits  CBC - Abnormal; Notable for the following components:   WBC 11.4 (*)    All other components within normal limits     IMAGES: Chest Xray 08/28/2018 FINDINGS: The heart size and mediastinal contours are within normal limits. No pneumothorax or pleural effusion is noted. Right lung is clear. Stable left basilar subsegmental atelectasis or scarring is noted. The visualized skeletal structures are unremarkable.  IMPRESSION: Stable left basilar  subsegmental atelectasis or scarring.  EKG: 08/28/2018 Rate 102 bpm Sinus tachycardia Atrial premature complex Left atrial enlargement  Borderline T abnormalities, inferior leads  CV:  Past Medical History:  Diagnosis Date  . Allergic rhinitis   . At risk for sleep apnea    STOP-BANG SCORE = 5    (routed to pt's pcp 09-08-2018)  . Chronic constipation   . Chronic low back pain   . Chronic pain   . DOE (dyspnea on exertion)   . History of kidney stones   . History of recent pneumonia 08/10/2018   acute bronchopneumonia;  follow-up cxr 08-28-2018 in epic  . History of vertebral compression fracture    T10 and T12 s/p kyphoplasty  . Hx of colonic polyps   . Hyperplasia of prostate with lower urinary tract symptoms (LUTS)   . Hypertension   . Mild persistent asthma    followed by pcp  . Numbness in both legs    secondary to a fall, per pt has had work-up and couldn't find anything  . Osteopenia   . 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 Wears glasses     Past Surgical History:  Procedure Laterality Date  . COLONOSCOPY    . IR KYPHO  EA ADDL LEVEL THORACIC OR LUMBAR  08/17/2018  . IR KYPHO EA ADDL LEVEL THORACIC OR LUMBAR  08/26/2018  . IR KYPHO LUMBAR INC FX REDUCE BONE BX UNI/BIL CANNULATION INC/IMAGING  08/26/2018  . IR KYPHO THORACIC WITH BONE BIOPSY  08/17/2018  . IR RADIOLOGIST EVAL & MGMT  08/11/2018  . IR RADIOLOGIST EVAL & MGMT  08/24/2018  . LYMPHADENECTOMY Left 01/05/2013   Procedure: ROBOTIC LYMPHADENECTOMY;  Surgeon: Dutch Gray, MD;  Location: [facility]S;  Service: Urology;  Laterality: Left;  . PROSTATE BIOPSY     11/12 positive biopsies  . ROTATOR CUFF REPAIR Left 2012  . UMBILICAL HERNIA REPAIR  09-02-2001   dr t. price '@[facility]'$     MEDICATIONS: . abiraterone acetate (ZYTIGA) 250 MG tablet  . albuterol  (PROAIR HFA) 108 (90 Base) MCG/ACT inhaler  . albuterol (PROVENTIL) (2.5 MG/3ML) 0.083% nebulizer solution  . azelastine (ASTELIN) 0.1 % nasal spray  . CALCIUM-VITAMIN D PO  . carvedilol (COREG) 3.125 MG tablet  . cyanocobalamin (,VITAMIN B-12,) 1000 MCG/ML injection  . cyclobenzaprine (FLEXERIL) 5 MG tablet  . diazepam (VALIUM) 5 MG tablet  . docusate sodium (COLACE) 100 MG capsule  . ipratropium-albuterol (DUONEB) 0.5-2.5 (3) MG/3ML SOLN  . Leuprolide Acetate (LUPRON IJ)  . magnesium citrate SOLN  . methocarbamol (ROBAXIN) 500 MG tablet  . mometasone (NASONEX) 50 MCG/ACT nasal spray  . morphine (MS CONTIN) 30 MG 12 hr tablet  . NARCAN 4 MG/0.1ML LIQD nasal spray kit  . olmesartan (BENICAR) 40 MG tablet  . oxyCODONE (OXY IR/ROXICODONE) 5 MG immediate release tablet  . phenylephrine-shark liver oil-mineral oil-petrolatum (PREPARATION H) 0.25-3-14-71.9 % rectal ointment  . Polyethylene Glycol 3350 (MIRALAX PO)  . predniSONE (DELTASONE) 5 MG tablet  . SYMBICORT 160-4.5 MCG/ACT inhaler  . azithromycin (ZITHROMAX) 250 MG tablet  . SYRINGE-NEEDLE, DISP, 3 ML 3 ML MISC   No current facility-administered medications for this encounter.     Maia Plan [facility] Pre-Surgical Testing [phone] 09/12/18 3:00 PM

## 2018-09-13 ENCOUNTER — Other Ambulatory Visit: Payer: Self-pay

## 2018-09-13 ENCOUNTER — Encounter (HOSPITAL_COMMUNITY): Payer: Self-pay

## 2018-09-13 ENCOUNTER — Telehealth: Payer: Self-pay

## 2018-09-13 ENCOUNTER — Emergency Department (HOSPITAL_COMMUNITY): Payer: BLUE CROSS/BLUE SHIELD

## 2018-09-13 DIAGNOSIS — N3001 Acute cystitis with hematuria: Secondary | ICD-10-CM | POA: Diagnosis present

## 2018-09-13 DIAGNOSIS — Z823 Family history of stroke: Secondary | ICD-10-CM | POA: Diagnosis not present

## 2018-09-13 DIAGNOSIS — C7952 Secondary malignant neoplasm of bone marrow: Secondary | ICD-10-CM

## 2018-09-13 DIAGNOSIS — J4530 Mild persistent asthma, uncomplicated: Secondary | ICD-10-CM | POA: Diagnosis present

## 2018-09-13 DIAGNOSIS — M4856XA Collapsed vertebra, not elsewhere classified, lumbar region, initial encounter for fracture: Secondary | ICD-10-CM | POA: Diagnosis present

## 2018-09-13 DIAGNOSIS — Z87891 Personal history of nicotine dependence: Secondary | ICD-10-CM | POA: Diagnosis not present

## 2018-09-13 DIAGNOSIS — G893 Neoplasm related pain (acute) (chronic): Secondary | ICD-10-CM | POA: Diagnosis present

## 2018-09-13 DIAGNOSIS — E876 Hypokalemia: Secondary | ICD-10-CM | POA: Diagnosis present

## 2018-09-13 DIAGNOSIS — Z888 Allergy status to other drugs, medicaments and biological substances status: Secondary | ICD-10-CM | POA: Diagnosis not present

## 2018-09-13 DIAGNOSIS — Z833 Family history of diabetes mellitus: Secondary | ICD-10-CM | POA: Diagnosis not present

## 2018-09-13 DIAGNOSIS — Z881 Allergy status to other antibiotic agents status: Secondary | ICD-10-CM | POA: Diagnosis not present

## 2018-09-13 DIAGNOSIS — R441 Visual hallucinations: Secondary | ICD-10-CM | POA: Diagnosis present

## 2018-09-13 DIAGNOSIS — M4850XA Collapsed vertebra, not elsewhere classified, site unspecified, initial encounter for fracture: Secondary | ICD-10-CM | POA: Diagnosis present

## 2018-09-13 DIAGNOSIS — R410 Disorientation, unspecified: Secondary | ICD-10-CM | POA: Diagnosis present

## 2018-09-13 DIAGNOSIS — C7951 Secondary malignant neoplasm of bone: Secondary | ICD-10-CM

## 2018-09-13 DIAGNOSIS — M4854XA Collapsed vertebra, not elsewhere classified, thoracic region, initial encounter for fracture: Secondary | ICD-10-CM | POA: Diagnosis present

## 2018-09-13 DIAGNOSIS — N390 Urinary tract infection, site not specified: Secondary | ICD-10-CM | POA: Diagnosis present

## 2018-09-13 DIAGNOSIS — Z8261 Family history of arthritis: Secondary | ICD-10-CM | POA: Diagnosis not present

## 2018-09-13 DIAGNOSIS — R52 Pain, unspecified: Secondary | ICD-10-CM | POA: Diagnosis not present

## 2018-09-13 DIAGNOSIS — Z8546 Personal history of malignant neoplasm of prostate: Secondary | ICD-10-CM | POA: Diagnosis not present

## 2018-09-13 DIAGNOSIS — Z515 Encounter for palliative care: Secondary | ICD-10-CM | POA: Diagnosis not present

## 2018-09-13 DIAGNOSIS — C679 Malignant neoplasm of bladder, unspecified: Secondary | ICD-10-CM | POA: Diagnosis present

## 2018-09-13 DIAGNOSIS — G9340 Encephalopathy, unspecified: Secondary | ICD-10-CM | POA: Diagnosis not present

## 2018-09-13 DIAGNOSIS — M8580 Other specified disorders of bone density and structure, unspecified site: Secondary | ICD-10-CM | POA: Diagnosis present

## 2018-09-13 DIAGNOSIS — G92 Toxic encephalopathy: Secondary | ICD-10-CM | POA: Diagnosis present

## 2018-09-13 DIAGNOSIS — Z8042 Family history of malignant neoplasm of prostate: Secondary | ICD-10-CM | POA: Diagnosis not present

## 2018-09-13 DIAGNOSIS — I10 Essential (primary) hypertension: Secondary | ICD-10-CM

## 2018-09-13 DIAGNOSIS — Z79891 Long term (current) use of opiate analgesic: Secondary | ICD-10-CM | POA: Diagnosis not present

## 2018-09-13 DIAGNOSIS — Z8349 Family history of other endocrine, nutritional and metabolic diseases: Secondary | ICD-10-CM | POA: Diagnosis not present

## 2018-09-13 DIAGNOSIS — K5909 Other constipation: Secondary | ICD-10-CM | POA: Diagnosis present

## 2018-09-13 DIAGNOSIS — Z8249 Family history of ischemic heart disease and other diseases of the circulatory system: Secondary | ICD-10-CM | POA: Diagnosis not present

## 2018-09-13 DIAGNOSIS — Z7951 Long term (current) use of inhaled steroids: Secondary | ICD-10-CM | POA: Diagnosis not present

## 2018-09-13 LAB — URINALYSIS, ROUTINE W REFLEX MICROSCOPIC
Bilirubin Urine: NEGATIVE
Glucose, UA: NEGATIVE mg/dL
Ketones, ur: NEGATIVE mg/dL
Nitrite: NEGATIVE
Protein, ur: 30 mg/dL — AB
Specific Gravity, Urine: 1.01 (ref 1.005–1.030)
WBC, UA: >50 WBC/hpf — ABNORMAL HIGH (ref 0–5)
pH: 8 (ref 5.0–8.0)

## 2018-09-13 LAB — COMPREHENSIVE METABOLIC PANEL
ALT: 56 U/L — ABNORMAL HIGH (ref 0–44)
AST: 34 U/L (ref 15–41)
Albumin: 3.6 g/dL (ref 3.5–5.0)
Alkaline Phosphatase: 139 U/L — ABNORMAL HIGH (ref 38–126)
Anion gap: 9 (ref 5–15)
BUN: 7 mg/dL — ABNORMAL LOW (ref 8–23)
CO2: 23 mmol/L (ref 22–32)
Calcium: 9.4 mg/dL (ref 8.9–10.3)
Chloride: 103 mmol/L (ref 98–111)
Creatinine, Ser: 0.99 mg/dL (ref 0.61–1.24)
GFR calc Af Amer: >60 mL/min (ref >60)
GFR calc non Af Amer: >60 mL/min (ref >60)
Glucose, Bld: 105 mg/dL — ABNORMAL HIGH (ref 70–99)
Potassium: 2.7 mmol/L — CL (ref 3.5–5.1)
Sodium: 135 mmol/L (ref 135–145)
Total Bilirubin: 1.1 mg/dL (ref 0.3–1.2)
Total Protein: 5.7 g/dL — ABNORMAL LOW (ref 6.5–8.1)

## 2018-09-13 LAB — TSH: TSH: 1.666 u[IU]/mL (ref 0.350–4.500)

## 2018-09-13 LAB — TROPONIN I: Troponin I: <0.03 ng/mL (ref <0.03)

## 2018-09-13 LAB — MAGNESIUM: Magnesium: 1 mg/dL — ABNORMAL LOW (ref 1.7–2.4)

## 2018-09-13 LAB — LACTIC ACID, PLASMA: Lactic Acid, Venous: 1.1 mmol/L (ref 0.5–1.9)

## 2018-09-13 LAB — POTASSIUM: Potassium: 3.1 mmol/L — ABNORMAL LOW (ref 3.5–5.1)

## 2018-09-13 MED ORDER — POTASSIUM CHLORIDE 10 MEQ/100ML IV SOLN
10.0000 meq | INTRAVENOUS | Status: AC
  Administered 2018-09-13 (×2): 10 meq via INTRAVENOUS
  Filled 2018-09-13 (×2): qty 100

## 2018-09-13 MED ORDER — MORPHINE SULFATE ER 30 MG PO TBCR: 30.0000 mg | EXTENDED_RELEASE_TABLET | Freq: Two times a day (BID) | ORAL | Status: DC

## 2018-09-13 MED ORDER — ALBUTEROL SULFATE (2.5 MG/3ML) 0.083% IN NEBU
2.5000 mg | INHALATION_SOLUTION | RESPIRATORY_TRACT | Status: DC | PRN
  Administered 2018-09-14 – 2018-09-22 (×14): 2.5 mg via RESPIRATORY_TRACT
  Filled 2018-09-13 (×14): qty 3

## 2018-09-13 MED ORDER — HALOPERIDOL LACTATE 5 MG/ML IJ SOLN: 2.0000 mg | Freq: Four times a day (QID) | INTRAMUSCULAR | Status: DC | PRN

## 2018-09-13 MED ORDER — CARVEDILOL 3.125 MG PO TABS
3.1250 mg | ORAL_TABLET | Freq: Two times a day (BID) | ORAL | Status: DC
  Administered 2018-09-13 – 2018-09-22 (×18): 3.125 mg via ORAL
  Filled 2018-09-13 (×18): qty 1

## 2018-09-13 MED ORDER — PREDNISONE 20 MG PO TABS: 40.0000 mg | ORAL_TABLET | Freq: Every day | ORAL | Status: DC

## 2018-09-13 MED ORDER — SODIUM CHLORIDE 0.9 % IV BOLUS
1000.0000 mL | Freq: Once | INTRAVENOUS | Status: AC
  Administered 2018-09-13: 1000 mL via INTRAVENOUS

## 2018-09-13 MED ORDER — MORPHINE SULFATE ER 15 MG PO TBCR: 15.0000 mg | EXTENDED_RELEASE_TABLET | Freq: Two times a day (BID) | ORAL | Status: DC

## 2018-09-13 MED ORDER — SODIUM CHLORIDE 0.9 % IV SOLN
INTRAVENOUS | Status: DC | PRN
  Administered 2018-09-13: 1000 mL via INTRAVENOUS
  Administered 2018-09-13: 10:00:00 via INTRAVENOUS

## 2018-09-13 MED ORDER — POTASSIUM CHLORIDE CRYS ER 20 MEQ PO TBCR
40.0000 meq | EXTENDED_RELEASE_TABLET | Freq: Once | ORAL | Status: AC
  Administered 2018-09-13: 40 meq via ORAL
  Filled 2018-09-13: qty 2

## 2018-09-13 MED ORDER — ALBUTEROL SULFATE (2.5 MG/3ML) 0.083% IN NEBU: 2.5000 mg | INHALATION_SOLUTION | Freq: Four times a day (QID) | RESPIRATORY_TRACT | Status: DC | PRN

## 2018-09-13 MED ORDER — OXYCODONE HCL 5 MG PO TABS
5.0000 mg | ORAL_TABLET | ORAL | Status: DC | PRN
  Administered 2018-09-13 (×2): 10 mg via ORAL
  Filled 2018-09-13 (×2): qty 2

## 2018-09-13 MED ORDER — DOCUSATE SODIUM 100 MG PO CAPS
100.0000 mg | ORAL_CAPSULE | Freq: Two times a day (BID) | ORAL | Status: DC
  Administered 2018-09-13 – 2018-09-14 (×2): 100 mg via ORAL
  Filled 2018-09-13 (×3): qty 1

## 2018-09-13 MED ORDER — ONDANSETRON HCL 4 MG/2ML IJ SOLN: 4.0000 mg | Freq: Four times a day (QID) | INTRAMUSCULAR | Status: DC | PRN

## 2018-09-13 MED ORDER — ONDANSETRON HCL 4 MG PO TABS: 4.0000 mg | ORAL_TABLET | Freq: Four times a day (QID) | ORAL | Status: DC | PRN

## 2018-09-13 MED ORDER — OXYCODONE HCL ER 15 MG PO T12A
15.0000 mg | EXTENDED_RELEASE_TABLET | Freq: Two times a day (BID) | ORAL | Status: DC
  Administered 2018-09-13 – 2018-09-18 (×10): 15 mg via ORAL
  Filled 2018-09-13 (×10): qty 1

## 2018-09-13 MED ORDER — IRBESARTAN 300 MG PO TABS
300.0000 mg | ORAL_TABLET | Freq: Every day | ORAL | Status: DC
  Administered 2018-09-13 – 2018-09-22 (×10): 300 mg via ORAL
  Filled 2018-09-13 (×10): qty 1

## 2018-09-13 MED ORDER — ALBUTEROL SULFATE HFA 108 (90 BASE) MCG/ACT IN AERS: 2.0000 | INHALATION_SPRAY | Freq: Four times a day (QID) | RESPIRATORY_TRACT | Status: DC | PRN

## 2018-09-13 MED ORDER — SODIUM CHLORIDE (PF) 0.9 % IJ SOLN
INTRAMUSCULAR | Status: AC
  Administered 2018-09-13: 10 mL
  Filled 2018-09-13: qty 50

## 2018-09-13 MED ORDER — OXYCODONE HCL 5 MG PO TABS
5.0000 mg | ORAL_TABLET | ORAL | Status: DC | PRN
  Administered 2018-09-13 – 2018-09-14 (×3): 5 mg via ORAL
  Filled 2018-09-13 (×4): qty 1

## 2018-09-13 MED ORDER — IPRATROPIUM-ALBUTEROL 0.5-2.5 (3) MG/3ML IN SOLN
3.0000 mL | Freq: Three times a day (TID) | RESPIRATORY_TRACT | Status: DC
  Administered 2018-09-13 – 2018-09-15 (×6): 3 mL via RESPIRATORY_TRACT
  Filled 2018-09-13 (×6): qty 3

## 2018-09-13 MED ORDER — ACETAMINOPHEN 325 MG PO TABS
650.0000 mg | ORAL_TABLET | Freq: Four times a day (QID) | ORAL | Status: DC | PRN
  Administered 2018-09-13 – 2018-09-16 (×2): 650 mg via ORAL
  Filled 2018-09-13 (×2): qty 2

## 2018-09-13 MED ORDER — METHOCARBAMOL 500 MG PO TABS
500.0000 mg | ORAL_TABLET | Freq: Two times a day (BID) | ORAL | Status: DC
  Administered 2018-09-13 – 2018-09-22 (×18): 500 mg via ORAL
  Filled 2018-09-13 (×18): qty 1

## 2018-09-13 MED ORDER — POTASSIUM CHLORIDE CRYS ER 20 MEQ PO TBCR
40.0000 meq | EXTENDED_RELEASE_TABLET | Freq: Two times a day (BID) | ORAL | Status: AC
  Administered 2018-09-13 (×2): 40 meq via ORAL
  Filled 2018-09-13 (×2): qty 2

## 2018-09-13 MED ORDER — ABIRATERONE ACETATE 250 MG PO TABS: 1000.0000 mg | ORAL_TABLET | Freq: Every day | ORAL | Status: DC

## 2018-09-13 MED ORDER — ENOXAPARIN SODIUM 40 MG/0.4ML ~~LOC~~ SOLN
40.0000 mg | SUBCUTANEOUS | Status: DC
  Administered 2018-09-13 – 2018-09-14 (×2): 40 mg via SUBCUTANEOUS
  Filled 2018-09-13 (×2): qty 0.4

## 2018-09-13 MED ORDER — SODIUM CHLORIDE 0.9 % IV SOLN
1.0000 g | INTRAVENOUS | Status: DC
  Administered 2018-09-13: 1 g via INTRAVENOUS
  Filled 2018-09-13: qty 1

## 2018-09-13 MED ORDER — MOMETASONE FURO-FORMOTEROL FUM 200-5 MCG/ACT IN AERO
2.0000 | INHALATION_SPRAY | Freq: Two times a day (BID) | RESPIRATORY_TRACT | Status: DC
  Administered 2018-09-13 – 2018-09-22 (×19): 2 via RESPIRATORY_TRACT
  Filled 2018-09-13: qty 8.8

## 2018-09-13 MED ORDER — MAGNESIUM SULFATE 50 % IJ SOLN
3.0000 g | Freq: Once | INTRAVENOUS | Status: AC
  Administered 2018-09-13: 3 g via INTRAVENOUS
  Filled 2018-09-13 (×2): qty 6

## 2018-09-13 MED ORDER — IOPAMIDOL (ISOVUE-370) INJECTION 76%
INTRAVENOUS | Status: AC
  Filled 2018-09-13: qty 100

## 2018-09-13 MED ORDER — SODIUM CHLORIDE 0.9 % IV SOLN: 1.0000 g | Freq: Three times a day (TID) | INTRAVENOUS | Status: DC

## 2018-09-13 MED ORDER — FENTANYL CITRATE (PF) 100 MCG/2ML IJ SOLN
50.0000 ug | Freq: Once | INTRAMUSCULAR | Status: AC
  Administered 2018-09-13: 50 ug via INTRAVENOUS
  Filled 2018-09-13: qty 2

## 2018-09-13 MED ORDER — IOPAMIDOL (ISOVUE-370) INJECTION 76%
100.0000 mL | Freq: Once | INTRAVENOUS | Status: AC | PRN
  Administered 2018-09-13: 100 mL via INTRAVENOUS

## 2018-09-13 MED ORDER — IPRATROPIUM-ALBUTEROL 0.5-2.5 (3) MG/3ML IN SOLN
3.0000 mL | Freq: Four times a day (QID) | RESPIRATORY_TRACT | Status: DC
  Administered 2018-09-13: 3 mL via RESPIRATORY_TRACT
  Filled 2018-09-13: qty 3

## 2018-09-13 MED ORDER — ACETAMINOPHEN 650 MG RE SUPP: 650.0000 mg | Freq: Four times a day (QID) | RECTAL | Status: DC | PRN

## 2018-09-13 MED ORDER — SODIUM CHLORIDE 0.9 % IV SOLN: 1.0000 g | INTRAVENOUS | Status: DC

## 2018-09-13 MED ORDER — SODIUM CHLORIDE 0.9 % IV SOLN
1.0000 g | Freq: Once | INTRAVENOUS | Status: AC
  Administered 2018-09-13: 1 g via INTRAVENOUS
  Filled 2018-09-13: qty 10

## 2018-09-13 MED ORDER — FENTANYL CITRATE (PF) 100 MCG/2ML IJ SOLN
75.0000 ug | Freq: Once | INTRAMUSCULAR | Status: AC
  Administered 2018-09-13: 75 ug via INTRAVENOUS
  Filled 2018-09-13: qty 2

## 2018-09-13 MED ORDER — MAGNESIUM CITRATE PO SOLN: 1.0000 | ORAL | Status: DC | PRN

## 2018-09-13 MED ORDER — IPRATROPIUM-ALBUTEROL 0.5-2.5 (3) MG/3ML IN SOLN
3.0000 mL | RESPIRATORY_TRACT | Status: DC | PRN
  Administered 2018-09-13: 3 mL via RESPIRATORY_TRACT
  Filled 2018-09-13: qty 3

## 2018-09-13 MED ORDER — PREDNISONE 5 MG PO TABS
5.0000 mg | ORAL_TABLET | Freq: Two times a day (BID) | ORAL | Status: DC
  Administered 2018-09-13 – 2018-09-22 (×18): 5 mg via ORAL
  Filled 2018-09-13 (×18): qty 1

## 2018-09-13 NOTE — H&P (Addendum)
History and Physical    [name] Holder ZOX:[id number] DOB: June 01, 1956 DOA: 09/12/2018  PCP: [name], MD  Patient coming from: Home  I have personally briefly reviewed patient's old medical records in [place]  Chief Complaint: AMS, back pain  HPI: [name] Holder is a 63 y.o. male with medical history significant of metastatic prostate cancer with mets to bone.  Patient on zytiga and prednisone since 2018.  Patient with increased back pain, has h/o multiple pathologic compression Fx treated with kyphoplasty including T10, T12, and just earlier this month kyphoplasty for L1 and L2.  Despite kyphoplasty he has had severe back pain requiring continuous use of oxycodone.  Dr. Alen Blew tried starting the patient on MS contin in an effort to cut down on short acting pain med use.  This was partially successful, however patient became over sedated and began to develop halucinations yesterday.  Today wife stopped MS contin after the AM dosing due to sedation and hallucinations.  Patients mental status improved somewhat but back pain worsened again.  He also has TURBT surgery scheduled for 2/20 with Dr. Alinda Money here at [facility].   ED Course: In the ED today, back pain controlled with IV fentanyl.  Imaging reveals new compression fractures at T8, T9 and L3, new since Jan.  UA shows many WBC, large LE.  EDP started patient on rocephin.   Review of Systems: As per HPI otherwise 10 point review of systems negative.   Past Medical History:  Diagnosis Date  . Allergic rhinitis   . At risk for sleep apnea    STOP-BANG SCORE = 5    (routed to pt's pcp 09-08-2018)  . Chronic constipation   . Chronic low back pain   . Chronic pain   . DOE (dyspnea on exertion)   . History of kidney stones   . History of recent pneumonia 08/10/2018   acute bronchopneumonia;  follow-up cxr 08-28-2018 in epic  . History of vertebral compression fracture    T10 and T12 s/p kyphoplasty  . Hx of  colonic polyps   . Hyperplasia of prostate with lower urinary tract symptoms (LUTS)   . Hypertension   . Mild persistent asthma    followed by pcp  . Numbness in both legs    secondary to a fall, per pt has had work-up and couldn't find anything  . Osteopenia   . Prostate cancer metastatic to pelvis [facility]) urologist-- dr borden/  oncologist -- dr Alen Blew   dx 2014 advanced prostate cancer with pelvid adenopathy,  Stage T2c, Gleason 7;  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  . Wears glasses     Past Surgical History:  Procedure Laterality Date  . COLONOSCOPY    . IR KYPHO EA ADDL LEVEL THORACIC OR LUMBAR  08/17/2018  . IR KYPHO EA ADDL LEVEL THORACIC OR LUMBAR  08/26/2018  . IR KYPHO LUMBAR INC FX REDUCE BONE BX UNI/BIL CANNULATION INC/IMAGING  08/26/2018  . IR KYPHO THORACIC WITH BONE BIOPSY  08/17/2018  . IR RADIOLOGIST EVAL & MGMT  08/11/2018  . IR RADIOLOGIST EVAL & MGMT  08/24/2018  . LYMPHADENECTOMY Left 01/05/2013   Procedure: ROBOTIC LYMPHADENECTOMY;  Surgeon: Dutch Gray, MD;  Location: [facility];  Service: Urology;  Laterality: Left;  . PROSTATE BIOPSY     11/12 positive biopsies  . ROTATOR CUFF REPAIR Left 2012  . UMBILICAL HERNIA REPAIR  09-02-2001   dr t. price _0      reports that he quit smoking about  6 years ago. His smoking use included cigarettes. He has a 12.50 pack-year smoking history. He has never used smokeless tobacco. He reports previous alcohol use of about 2.0 standard drinks of alcohol per week. He reports that he does not use drugs.  Allergies  Allergen Reactions  . Amlodipine Swelling    edema  . Tetracycline Rash    Family History  Problem Relation Age of Onset  . Arthritis Mother   . Diabetes Mother   . Hypertension Mother   . Hyperlipidemia Mother   . Hypertension Father   . Prostate cancer Father        radiation in mid 58s  . Heart attack Father   . Cancer Sister        precancerous colon polyps, 6 in colon removed  . Stroke Maternal Grandfather       Prior to Admission medications   Medication Sig Start Date End Date Taking? Authorizing Provider  abiraterone acetate (ZYTIGA) 250 MG tablet Take 1,000 mg by mouth daily.  08/17/17  Yes [provider]  albuterol (PROAIR HFA) 108 (90 Base) MCG/ACT inhaler Inhale 2 puffs into the lungs every 6 (six) hours as needed for wheezing or shortness of breath. 08/10/18  Yes Caccavale, Sophia, PA-C  albuterol (PROVENTIL) (2.5 MG/3ML) 0.083% nebulizer solution Take 3 mLs (2.5 mg total) by nebulization every 6 (six) hours as needed for wheezing or shortness of breath. 08/15/18  Yes Marrian Salvage, FNP  azelastine (ASTELIN) 0.1 % nasal spray Place 2 sprays into both nostrils every morning. Patient taking differently: Place 2 sprays into both nostrils daily as needed for allergies.  04/12/18  Yes Johnson, Clanford L, MD  CALCIUM-VITAMIN D PO Take 1 tablet by mouth 2 (two) times daily.    Yes [provider]  carvedilol (COREG) 3.125 MG tablet Take 1 tablet (3.125 mg total) by mouth 2 (two) times daily with a meal. 09/05/18  Yes [name], MD  cyanocobalamin (,VITAMIN B-12,) 1000 MCG/ML injection Inject 1,000 mcg into the muscle every 30 (thirty) days.  05/14/18  Yes [provider]  cyclobenzaprine (FLEXERIL) 5 MG tablet Take 5 mg by mouth 3 (three) times daily as needed for muscle spasms.  05/16/18  Yes [provider]  docusate sodium (COLACE) 100 MG capsule Take 100 mg by mouth 2 (two) times daily.    Yes [provider]  ipratropium-albuterol (DUONEB) 0.5-2.5 (3) MG/3ML SOLN Take 3 mLs by nebulization every 4 (four) hours as needed. 08/19/18  Yes Marrian Salvage, FNP  Leuprolide Acetate (LUPRON IJ) Inject 1 Dose as directed every 4 (four) months. This is administered by [facility]   Yes [provider]  magnesium citrate SOLN Take 1 Bottle by mouth as needed for severe constipation.   Yes [provider]  methocarbamol  (ROBAXIN) 500 MG tablet Take 500 mg by mouth 2 (two) times daily.    Yes [provider]  mometasone (NASONEX) 50 MCG/ACT nasal spray USE 1 TO 2 SPRAYS IN EACH NOSTRIL ONCE DAILY. Patient taking differently: Place 2 sprays into the nose daily as needed (allergies).  09/16/17  Yes [name], MD  morphine (MS CONTIN) 30 MG 12 hr tablet Take 1 tablet (30 mg total) by mouth every 12 (twelve) hours. 09/05/18  Yes Wyatt Portela, MD  NARCAN 4 MG/0.1ML LIQD nasal spray kit Place 0.4 mg into the nose once.  08/18/18  Yes [provider]  olmesartan (BENICAR) 40 MG tablet TAKE 1 TABLET  DAILY 08/08/18  Yes [name], MD  oxyCODONE (OXY IR/ROXICODONE) 5 MG immediate release tablet Take 1 tablet (5 mg total) by mouth every 4 (four) hours as needed for severe pain (may take 1-2 tablets every 4 hours). Patient taking differently: Take 5-10 mg by mouth every 4 (four) hours as needed for severe pain (may take 1-2 tablets every 4 hours).  09/05/18  Yes Wyatt Portela, MD  Polyethylene Glycol 3350 (MIRALAX PO) Take 1 Dose by mouth daily as needed (constipation).    Yes [provider]  predniSONE (DELTASONE) 5 MG tablet Take 8 tablets (40 mg total) by mouth daily. Taper as directed Patient taking differently: Take 40 mg by mouth 2 (two) times daily. Takes while on Zytiga 08/28/18  Yes Hayden Rasmussen, MD  SYMBICORT 160-4.5 MCG/ACT inhaler Inhale 2 puffs into the lungs 2 (two) times daily. Patient taking differently: Inhale 2 puffs into the lungs 2 (two) times daily.  08/19/18  Yes [name], MD  SYRINGE-NEEDLE, DISP, 3 ML 3 ML MISC Use to inject b12 monthly. 04/19/18   [name], MD    Physical Exam: Vitals:   09/12/18 2145 09/12/18 2230 09/12/18 2300  BP: (!) 103/91 125/81 123/73  Pulse: (!) 106 96 100  Resp: 17    Temp: 97.9 F (36.6 C)    TempSrc: Oral    SpO2: 100% 94% 93%  Weight: 79.4 kg    Height: _0  (1.727 m)      Constitutional: NAD, calm,  comfortable Eyes: PERRL, lids and conjunctivae normal ENMT: Mucous membranes are moist. Posterior pharynx clear of any exudate or lesions.Normal dentition.  Neck: normal, supple, no masses, no thyromegaly Respiratory: clear to auscultation bilaterally, no wheezing, no crackles. Normal respiratory effort. No accessory muscle use.  Cardiovascular: Regular rate and rhythm, no murmurs / rubs / gallops. No extremity edema. 2+ pedal pulses. No carotid bruits.  Abdomen: no tenderness, no masses palpated. No hepatosplenomegaly. Bowel sounds positive.  Musculoskeletal: no clubbing / cyanosis. No joint deformity upper and lower extremities. Good ROM, no contractures. Normal muscle tone.  Skin: no rashes, lesions, ulcers. No induration Neurologic: CN 2-12 grossly intact. Sensation intact, DTR normal. Strength 5/5 in all 4.  Psychiatric: Normal judgment and insight. Alert and oriented x 3. Normal mood.    Labs on Admission: I have personally reviewed following labs and imaging studies  CBC: Recent Labs  Lab 09/08/18 1140 09/12/18 2255  WBC 11.4* 8.7  NEUTROABS  --  5.8  HGB 13.9 13.1  HCT 40.6 38.5*  MCV 93.3 93.9  PLT 383 803   Basic Metabolic Panel: Recent Labs  Lab 09/08/18 1140 09/12/18 2255  NA 137 135  K 3.2* 2.7*  CL 102 103  CO2 24 23  GLUCOSE 145* 105*  BUN 10 7*  CREATININE 0.81 0.99  CALCIUM 9.8 9.4   GFR: Estimated Creatinine Clearance: 72.9 mL/min (by C-G formula based on SCr of 0.99 mg/dL). Liver Function Tests: Recent Labs  Lab 09/12/18 2255  AST 34  ALT 56*  ALKPHOS 139*  BILITOT 1.1  PROT 5.7*  ALBUMIN 3.6   No results for input(s): LIPASE, AMYLASE in the last 168 hours. No results for input(s): AMMONIA in the last 168 hours. Coagulation Profile: No results for input(s): INR, PROTIME in the last 168 hours. Cardiac Enzymes: Recent Labs  Lab 09/12/18 2307  TROPONINI <0.03   BNP (last 3 results) No results for input(s): PROBNP in the last 8760  hours. HbA1C:  No results for input(s): HGBA1C in the last 72 hours. CBG: No results for input(s): GLUCAP in the last 168 hours. Lipid Profile: No results for input(s): CHOL, HDL, LDLCALC, TRIG, CHOLHDL, LDLDIRECT in the last 72 hours. Thyroid Function Tests: Recent Labs    09/12/18 2257  TSH 1.666   Anemia Panel: No results for input(s): VITAMINB12, FOLATE, FERRITIN, TIBC, IRON, RETICCTPCT in the last 72 hours. Urine analysis:    Component Value Date/Time   COLORURINE AMBER (A) 09/12/2018 2255   APPEARANCEUR CLOUDY (A) 09/12/2018 2255   LABSPEC 1.010 09/12/2018 2255   PHURINE 8.0 09/12/2018 2255   GLUCOSEU NEGATIVE 09/12/2018 2255   GLUCOSEU NEGATIVE 08/31/2016 1030   HGBUR MODERATE (A) 09/12/2018 2255   BILIRUBINUR NEGATIVE 09/12/2018 2255   KETONESUR NEGATIVE 09/12/2018 2255   PROTEINUR 30 (A) 09/12/2018 2255   UROBILINOGEN 0.2 08/31/2016 1030   NITRITE NEGATIVE 09/12/2018 2255   LEUKOCYTESUR LARGE (A) 09/12/2018 2255    Radiological Exams on Admission: Ct Head W Or Wo Contrast  Result Date: 09/13/2018 CLINICAL DATA:  Initial evaluation for hallucinations. History of metastatic prostate cancer. EXAM: CT HEAD WITHOUT AND WITH CONTRAST TECHNIQUE: Contiguous axial images were obtained from the base of the skull through the vertex without and with intravenous contrast CONTRAST:  156m ISOVUE-370 IOPAMIDOL (ISOVUE-370) INJECTION 76% COMPARISON:  Prior MRI from 04/11/2018 FINDINGS: Brain: Examination limited by motion artifact. Generalized age-related cerebral atrophy. No acute intracranial hemorrhage. No acute large vessel territory infarct. No mass lesion, midline shift or mass effect. No hydrocephalus. No extra-axial fluid collection. No appreciable abnormal enhancement. Vascular: No hyperdense vessel. Scattered vascular calcifications noted within the carotid siphons. Normal intravascular enhancement seen throughout the intracranial vasculature following contrast administration.  Skull: Scalp soft tissues demonstrate no acute finding. Calvarium intact. No definite focal osseous lesions on this limited motion degraded exam. Corticated lucency extending through the anterior arch of C1, chronic in appearance, likely congenital. Sinuses/Orbits: Globes and orbital soft tissues within normal limits. Paranasal sinuses and mastoid air cells are grossly clear. Other: None. IMPRESSION: 1. Technically limited exam due to extensive motion artifact. 2. Grossly negative head CT with and without contrast. No acute intracranial abnormality. No mass lesion or abnormal enhancement. Electronically Signed   By: BJeannine BogaM.D.   On: 09/13/2018 02:17   Ct Angio Chest Pe W And/or Wo Contrast  Result Date: 09/13/2018 CLINICAL DATA:  63year old male with shortness of breath, possible small bowel obstruction. Metastatic prostate cancer. EXAM: CT ANGIOGRAPHY CHEST CT ABDOMEN AND PELVIS WITH CONTRAST TECHNIQUE: Multidetector CT imaging of the chest was performed using the standard protocol during bolus administration of intravenous contrast. Multiplanar CT image reconstructions and MIPs were obtained to evaluate the vascular anatomy. Multidetector CT imaging of the abdomen and pelvis was performed using the standard protocol during bolus administration of intravenous contrast. CONTRAST:  1048mISOVUE-370 IOPAMIDOL (ISOVUE-370) INJECTION 76% COMPARISON:  Head CT today reported separately. Chest CTA 08/10/2018. CT Abdomen and Pelvis 07/28/2018. FINDINGS: CTA CHEST FINDINGS Cardiovascular: Good contrast bolus timing in the pulmonary arterial tree. Respiratory motion. No central pulmonary embolus. The upper lobe segmental branches also appear patent. The lower lobe branches are poorly evaluated due to motion. No cardiomegaly or pericardial effusion. Stable mild tortuosity of the thoracic aorta. Mediastinum/Nodes: No lymphadenopathy. Lungs/Pleura: Major airways are patent. Chronic atelectasis or scarring in  the left lower lung. No pleural effusion or acute pulmonary opacity. Musculoskeletal: Bone detail substantially degraded by motion at some levels. The T10 compression fracture has been augmented. There  are new T8 and T9 moderate compression fractures (series 27, image 77). There is a new nonacute appearing anterior left lower rib fracture with callus on series 25, image 114. Review of the MIP images confirms the above findings. CT ABDOMEN and PELVIS FINDINGS Intermittent motion artifact. Hepatobiliary: Stable and negative. Pancreas: Negative. Spleen: Negative. Adrenals/Urinary Tract: Adrenal glands and kidneys appear stable. Renal enhancement and contrast excretion appears symmetric and normal. There are small bilateral benign appearing renal cysts. Urinary bladder detail today is degraded by motion. Stomach/Bowel: Increased retained stool in the large bowel. Redundant sigmoid colon. No large bowel inflammation. Gas and fluid-filled small bowel loops measure at the upper limits of normal, 28 millimeters. Some detail is degraded by motion but there is no small bowel transition identified. The stomach is decompressed.  No free air, free fluid. Vascular/Lymphatic: Aortoiliac calcified atherosclerosis. Major arterial structures appear to remain patent. Grossly patent portal venous system. No lymphadenopathy. Reproductive: Negative. Other: No pelvic free fluid. Musculoskeletal: Bone detail intermittently degraded by motion. Augmented T12, L1, and L2 compression fractures. Mild L3 superior endplate compression since 07/28/2018. Review of the MIP images confirms the above findings. IMPRESSION: 1. Motion degraded CTA of the chest and CT Abdomen and Pelvis. 2. No central pulmonary embolus, but pulmonary artery branch detail is degraded by motion. 3. New compression fractures of T8, T9, and L3 since January which may be pathologic fractures in the setting of prostate cancer. Interval augmentation of the T10, T12, L1 and L2  fractures. 4. No other new abnormality identified in the chest, abdomen, or pelvis. Electronically Signed   By: Genevie Ann M.D.   On: 09/13/2018 02:36   Ct Abdomen Pelvis W Contrast  Result Date: 09/13/2018 CLINICAL DATA:  63 year old male with shortness of breath, possible small bowel obstruction. Metastatic prostate cancer. EXAM: CT ANGIOGRAPHY CHEST CT ABDOMEN AND PELVIS WITH CONTRAST TECHNIQUE: Multidetector CT imaging of the chest was performed using the standard protocol during bolus administration of intravenous contrast. Multiplanar CT image reconstructions and MIPs were obtained to evaluate the vascular anatomy. Multidetector CT imaging of the abdomen and pelvis was performed using the standard protocol during bolus administration of intravenous contrast. CONTRAST:  120m ISOVUE-370 IOPAMIDOL (ISOVUE-370) INJECTION 76% COMPARISON:  Head CT today reported separately. Chest CTA 08/10/2018. CT Abdomen and Pelvis 07/28/2018. FINDINGS: CTA CHEST FINDINGS Cardiovascular: Good contrast bolus timing in the pulmonary arterial tree. Respiratory motion. No central pulmonary embolus. The upper lobe segmental branches also appear patent. The lower lobe branches are poorly evaluated due to motion. No cardiomegaly or pericardial effusion. Stable mild tortuosity of the thoracic aorta. Mediastinum/Nodes: No lymphadenopathy. Lungs/Pleura: Major airways are patent. Chronic atelectasis or scarring in the left lower lung. No pleural effusion or acute pulmonary opacity. Musculoskeletal: Bone detail substantially degraded by motion at some levels. The T10 compression fracture has been augmented. There are new T8 and T9 moderate compression fractures (series 27, image 77). There is a new nonacute appearing anterior left lower rib fracture with callus on series 25, image 114. Review of the MIP images confirms the above findings. CT ABDOMEN and PELVIS FINDINGS Intermittent motion artifact. Hepatobiliary: Stable and negative.  Pancreas: Negative. Spleen: Negative. Adrenals/Urinary Tract: Adrenal glands and kidneys appear stable. Renal enhancement and contrast excretion appears symmetric and normal. There are small bilateral benign appearing renal cysts. Urinary bladder detail today is degraded by motion. Stomach/Bowel: Increased retained stool in the large bowel. Redundant sigmoid colon. No large bowel inflammation. Gas and fluid-filled small bowel loops measure at the  upper limits of normal, 28 millimeters. Some detail is degraded by motion but there is no small bowel transition identified. The stomach is decompressed.  No free air, free fluid. Vascular/Lymphatic: Aortoiliac calcified atherosclerosis. Major arterial structures appear to remain patent. Grossly patent portal venous system. No lymphadenopathy. Reproductive: Negative. Other: No pelvic free fluid. Musculoskeletal: Bone detail intermittently degraded by motion. Augmented T12, L1, and L2 compression fractures. Mild L3 superior endplate compression since 07/28/2018. Review of the MIP images confirms the above findings. IMPRESSION: 1. Motion degraded CTA of the chest and CT Abdomen and Pelvis. 2. No central pulmonary embolus, but pulmonary artery branch detail is degraded by motion. 3. New compression fractures of T8, T9, and L3 since January which may be pathologic fractures in the setting of prostate cancer. Interval augmentation of the T10, T12, L1 and L2 fractures. 4. No other new abnormality identified in the chest, abdomen, or pelvis. Electronically Signed   By: Genevie Ann M.D.   On: 09/13/2018 02:36   Dg Abdomen Acute W/chest  Result Date: 09/12/2018 CLINICAL DATA:  Right-sided rib pain EXAM: DG ABDOMEN ACUTE W/ 1V CHEST COMPARISON:  08/28/2018 FINDINGS: Single-view chest demonstrates platelike atelectasis in the left mid lung with streaky atelectasis at the left base. Stable cardiomediastinal silhouette. Supine and decubitus views of the abdomen demonstrate no definite  free air. Mildly dilated loops of central small bowel measuring up to 3 cm with scattered distal bowel gas noted. Scoliosis of the spine with multiple treated compression fractures. IMPRESSION: 1. Low lung volumes with streaky atelectasis at the left mid to lower lung. 2. Mild air distention of central small bowel with distal gas present, findings could be secondary to ileus or partial bowel obstruction. Electronically Signed   By: Donavan Foil M.D.   On: 09/12/2018 23:44    EKG: Independently reviewed.  Assessment/Plan Principal Problem:   Delirium Active Problems:   Essential hypertension   History of prostate cancer   Secondary malignant neoplasm of bone and bone marrow (HCC)   Pathologic compression fracture of spine, initial encounter (HCC)   Acute lower UTI   Cancer associated pain   Bladder cancer (HCC)   Acute encephalopathy    1. Delirium - 1. Secondary to some combination of: 1. oversedation with narcotics 2. Back pain due to compression fractures 3. UTI 4. Chronic prednisone usage 2. Possible UTI - 1. UCx pending 2. Will leave on the empiric rocephin for the moment, especially given upcoming planned surgery in 2 days. 3. New pathologic compression fractures - 1. IR eval and possibly perform further kyphoplasties if indicated 4. Metastatic prostate cancer - 1. Continue Zytiga 2. Continue 46m prednisone BID for the moment 3. Put consult to Oncology in Epic 4. Scheduled for TURBT on 2/20 5. Cancer associated pain - 1. Stop MS contin 2. Will switch to Oxycontin (since he has tolerated short acting oxycodone in past without AMS) 176mPO BID for the moment 3. Continue short acting oxycodone PRN breakthrough 4. Hopefully to wean further down if pain improves post Kyphoplasties of the new compression fractures. 6. HTN - continue home BP meds 7. Hypokalemia - 1. Replace K, check repeat K in AM  DVT prophylaxis: Lovenox Code Status: Full Family Communication: Wife at  bedside Disposition Plan: Home after admit Consults called: None called, consults put into Epic for oncology and IR eval Admission status: Admit to inpatient  Severity of Illness: The appropriate patient status for this patient is INPATIENT. Inpatient status is judged to be reasonable  and necessary in order to provide the required intensity of service to ensure the patient's safety. The patient's presenting symptoms, physical exam findings, and initial radiographic and laboratory data in the context of their chronic comorbidities is felt to place them at high risk for further clinical deterioration. Furthermore, it is not anticipated that the patient will be medically stable for discharge from the hospital within 2 midnights of admission. The following factors support the patient status of inpatient.   " The patient's presenting symptoms include AMS, severe back pain. " The initial radiographic and laboratory data are worrisome because of possible UTI, new pathologic compression fractures. " The chronic co-morbidities include metastatic prostate cancer.   * I certify that at the point of admission it is my clinical judgment that the patient will require inpatient hospital care spanning beyond 2 midnights from the point of admission due to high intensity of service, high risk for further deterioration and high frequency of surveillance required.*    Lidya Mccalister M. DO [facility]  How to contact the [facility] Attending or Consulting provider [place] or covering provider during after hours [place], for this patient?  1. Check the care team in [facility] and look for a) attending/consulting [facility] provider listed and b) the [facility] team listed 2. Log into [email or web address]  Amion Physician Scheduling and messaging for groups and whole hospitals  On call and physician scheduling software for group practices, residents, hospitalists and other medical providers for call, clinic, rotation and shift schedules.  On[employer] is a hospital-wide system for scheduling doctors and paging doctors on call. EasyPlot is for scientific plotting and data analysis.  [email or web address]  and use [place]'s universal password to access. If you do not have the password, please contact the hospital operator.  3. Locate the [facility] provider you are looking for under [facility] and page to a number that you can be directly reached. 4. If you still have difficulty reaching the provider, please page the [facility] (Director on Call) for the Hospitalists listed on amion for assistance.  09/13/2018, 3:43 AM

## 2018-09-13 NOTE — ED Notes (Signed)
Date and time results received: 09/13/18 12:33   Test: Potassium  Critical Value: 2.7  Name of Provider Notified: Mia, PA  Orders Received? Or Actions Taken?: Will continue to monitor patient and await for new orders.

## 2018-09-13 NOTE — Progress Notes (Signed)
Text paged MD regarding change patient agitation.

## 2018-09-13 NOTE — Progress Notes (Addendum)
[name] Holder is a 63 y.o. male with medical history significant of metastatic prostate cancer with mets to bone.  Patient on zytiga and prednisone since 2018. Patient with increased back pain, has h/o multiple pathologic compression Fx treated with kyphoplasty including T10, T12, and just earlier this month kyphoplasty for L1 and L2. Despite kyphoplasty he has had severe back pain requiring continuous use of oxycodone.  Dr. Alen Blew tried starting the patient on MS contin in an effort to cut down on short acting pain med use. Pt became confused and was brought to ED for further evaluation.    Assessment:  Acute encephalopathy from sedation from narcotic pain meds and possible UTI.  Improving.b but still dozing off in the middle of a sentence.  He received 2 doses of IV FENTANYL.  He is on oxycontin 15 mg BID and oxycodone  5 to 10 mg every 4 hours.  We will decrease the dose of oxycodone to 5 mg every 4 hours prn.  Discussed with Dr Noralee Chars who will see the patient tomorrow, to see if he is a candidate for TURBT on 09/15/18.  Left a message for Dr Alen Blew about the patient's admission.     Hosie Poisson,  MD [phone]

## 2018-09-13 NOTE — ED Notes (Signed)
KCL drip completed pt ok to go to med-surg floor

## 2018-09-13 NOTE — Telephone Encounter (Signed)
This RN received follow up call from patient on 2/17 stating that the patient woke up from a 3 hour nap confused. Ulla Potash to not give the MS Contin and to use the Oxycodone for any pain concerns since the patient had been tolerating that well. Explained that will follow up with Dr. Alen Blew in am for further instructions but if patient confusion does not improve to then go to the ED.  Juliann Pulse verbalized understanding and agreeable with the plan, she then requested a light weight wheelchair with elevated leg rests and gel cushion for home and community use. She also stated that the hospital bed will be delivered today 2/17. Follow up call on 2/18 and Juliann Pulse stated that the patient was admitted for UTI and pain management. Wheelchair was ordered and contacted [facility] [place] and made aware.

## 2018-09-13 NOTE — ED Notes (Signed)
ED TO INPATIENT HANDOFF REPORT  Name/Age/Gender [name] Holder 63 y.o. male  Code Status    Code Status Orders  (From admission, onward)         Start     Ordered   09/13/18 0328  Full code  Continuous     09/13/18 0328        Code Status History    Date Active Date Inactive Code Status Order ID Comments User Context   04/11/2018 0459 04/12/2018 1806 Full Code [id number]  Rodena Goldmann, DO ED    Advance Directive Documentation     Most Recent Value  Type of Advance Directive  Healthcare Power of Attorney, Living will  Pre-existing out of facility DNR order (yellow form or pink MOST form)  -  "MOST" Form in Place?  -      Home/SNF/Other Home  Chief Complaint Back Pain;Medication Reation  Level of Care/Admitting Diagnosis ED Disposition    ED Disposition Condition [place]: [facility] [[id number]]  Level of Care: Med-Surg [16]  Diagnosis: Acute encephalopathy [562130]  Admitting Physician: Doreatha Massed  Attending Physician: Etta Quill [phone]  Estimated length of stay: past midnight tomorrow  Certification:: I certify this patient will need inpatient services for at least 2 midnights  PT Class (Do Not Modify): Inpatient [101]  PT Acc Code (Do Not Modify): Private [1]       Medical History Past Medical History:  Diagnosis Date  . Allergic rhinitis   . At risk for sleep apnea    STOP-BANG SCORE = 5    (routed to pt's pcp 09-08-2018)  . Chronic constipation   . Chronic low back pain   . Chronic pain   . DOE (dyspnea on exertion)   . History of kidney stones   . History of recent pneumonia 08/10/2018   acute bronchopneumonia;  follow-up cxr 08-28-2018 in epic  . History of vertebral compression fracture    T10 and T12 s/p kyphoplasty  . Hx of colonic polyps   . Hyperplasia of prostate with lower urinary tract symptoms (LUTS)   . Hypertension   . Mild persistent asthma    followed by pcp  .  Numbness in both legs    secondary to a fall, per pt has had work-up and couldn't find anything  . Osteopenia   . 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 Wears glasses     Allergies Allergies  Allergen Reactions  . Amlodipine Swelling    edema  . Tetracycline Rash    IV Location/Drains/Wounds Patient Lines/Drains/Airways Status   Active Line/Drains/Airways    Name:   Placement date:   Placement time:   Site:   Days:   Peripheral IV 09/12/18 Right;Lateral Wrist   09/12/18    2320    Wrist   1   Incision (Closed) 08/17/18 Back Left;Right   08/17/18    1335     27   Incision (Closed) 08/26/18 Back Mid   08/26/18    1047     18          Labs/Imaging Results for orders placed or performed during the hospital encounter of 09/12/18 (from the past 48 hour(s))  CBC with Differential     Status: Abnormal   Collection Time:  09/12/18 10:55 PM  Result Value Ref Range   WBC 8.7 4.0 - 10.5 K/uL   RBC 4.10 (L) 4.22 - 5.81 MIL/uL   Hemoglobin 13.1 13.0 - 17.0 g/dL   HCT 38.5 (L) 39.0 - 52.0 %   MCV 93.9 80.0 - 100.0 fL   MCH 32.0 26.0 - 34.0 pg   MCHC 34.0 30.0 - 36.0 g/dL   RDW 11.9 11.5 - 15.5 %   Platelets 264 150 - 400 K/uL   nRBC 0.0 0.0 - 0.2 %   Neutrophils Relative % 66 %   Neutro Abs 5.8 1.7 - 7.7 K/uL   Lymphocytes Relative 23 %   Lymphs Abs 2.0 0.7 - 4.0 K/uL   Monocytes Relative 9 %   Monocytes Absolute 0.8 0.1 - 1.0 K/uL   Eosinophils Relative 1 %   Eosinophils Absolute 0.1 0.0 - 0.5 K/uL   Basophils Relative 0 %   Basophils Absolute 0.0 0.0 - 0.1 K/uL   Immature Granulocytes 1 %   Abs Immature Granulocytes 0.07 0.00 - 0.07 K/uL    Comment: Performed at [facility], [place] [address] [place], [place] [address]  Comprehensive metabolic panel     Status: Abnormal   Collection Time:  09/12/18 10:55 PM  Result Value Ref Range   Sodium 135 135 - 145 mmol/L   Potassium 2.7 (LL) 3.5 - 5.1 mmol/L    Comment: CRITICAL RESULT CALLED TO, READ BACK BY AND VERIFIED WITH: RN [facility] AT 4627 09/13/18 CRUICKSHANK A    Chloride 103 98 - 111 mmol/L   CO2 23 22 - 32 mmol/L   Glucose, Bld 105 (H) 70 - 99 mg/dL   BUN 7 (L) 8 - 23 mg/dL   Creatinine, Ser 0.99 0.61 - 1.24 mg/dL   Calcium 9.4 8.9 - 10.3 mg/dL   Total Protein 5.7 (L) 6.5 - 8.1 g/dL   Albumin 3.6 3.5 - 5.0 g/dL   AST 34 15 - 41 U/L   ALT 56 (H) 0 - 44 U/L   Alkaline Phosphatase 139 (H) 38 - 126 U/L   Total Bilirubin 1.1 0.3 - 1.2 mg/dL   GFR calc non Af Amer >60 >60 mL/min   GFR calc Af Amer >60 >60 mL/min   Anion gap 9 5 - 15    Comment: Performed at [facility], [place] [address] [place], [place] [address]  Urinalysis, Routine w reflex microscopic     Status: Abnormal   Collection Time: 09/12/18 10:55 PM  Result Value Ref Range   Color, Urine AMBER (A) YELLOW    Comment: BIOCHEMICALS MAY BE AFFECTED BY COLOR   APPearance CLOUDY (A) CLEAR   Specific Gravity, Urine 1.010 1.005 - 1.030   pH 8.0 5.0 - 8.0   Glucose, UA NEGATIVE NEGATIVE mg/dL   Hgb urine dipstick MODERATE (A) NEGATIVE   Bilirubin Urine NEGATIVE NEGATIVE   Ketones, ur NEGATIVE NEGATIVE mg/dL   Protein, ur 30 (A) NEGATIVE mg/dL   Nitrite NEGATIVE NEGATIVE   Leukocytes,Ua LARGE (A) NEGATIVE   RBC / HPF 11-20 0 - 5 RBC/hpf   WBC, UA >50 (H) 0 - 5 WBC/hpf   Bacteria, UA FEW (A) NONE SEEN   WBC Clumps PRESENT    Mucus PRESENT    Hyaline Casts, UA PRESENT     Comment: Performed at [facility], [place] [address] [place], [place] [address]  TSH     Status: None   Collection Time: 09/12/18 10:57 PM  Result Value Ref Range   TSH 1.666 0.350 - 4.500 uIU/mL    Comment: Performed by a 3rd Generation assay with a functional sensitivity of <=0.01 uIU/mL. Performed at [facility], [place] [address],  [place], [place] [address]   Troponin I - ONCE - STAT     Status: None   Collection Time: 09/12/18 11:07 PM  Result Value Ref Range   Troponin I <0.03 <0.03 ng/mL    Comment: Performed at [facility], [place] [address], [place],  [address]   Ct Head W Or Wo Contrast  Result Date: 09/13/2018 CLINICAL DATA:  Initial evaluation for hallucinations. History of metastatic prostate cancer. EXAM: CT HEAD WITHOUT AND WITH CONTRAST TECHNIQUE: Contiguous axial images were obtained from the base of the skull through the vertex without and with intravenous contrast CONTRAST:  128mL ISOVUE-370 IOPAMIDOL (ISOVUE-370) INJECTION 76% COMPARISON:  Prior MRI from 04/11/2018 FINDINGS: Brain: Examination limited by motion artifact. Generalized age-related cerebral atrophy. No acute intracranial hemorrhage. No acute large vessel territory infarct. No mass lesion, midline shift or mass effect. No hydrocephalus. No extra-axial fluid collection. No appreciable abnormal enhancement. Vascular: No hyperdense vessel. Scattered vascular calcifications noted within the carotid siphons. Normal intravascular enhancement seen throughout the intracranial vasculature following contrast administration. Skull: Scalp soft tissues demonstrate no acute finding. Calvarium intact. No definite focal osseous lesions on this limited motion degraded exam. Corticated lucency extending through the anterior arch of C1, chronic in appearance, likely congenital. Sinuses/Orbits: Globes and orbital soft tissues within normal limits. Paranasal sinuses and mastoid air cells are grossly clear. Other: None. IMPRESSION: 1. Technically limited exam due to extensive motion artifact. 2. Grossly negative head CT with and without contrast. No acute intracranial abnormality. No mass lesion or abnormal enhancement. Electronically Signed   By: Jeannine Boga M.D.   On: 09/13/2018 02:17   Ct Angio Chest Pe W And/or Wo Contrast  Result Date:  09/13/2018 CLINICAL DATA:  62 year old male with shortness of breath, possible small bowel obstruction. Metastatic prostate cancer. EXAM: CT ANGIOGRAPHY CHEST CT ABDOMEN AND PELVIS WITH CONTRAST TECHNIQUE: Multidetector CT imaging of the chest was performed using the standard protocol during bolus administration of intravenous contrast. Multiplanar CT image reconstructions and MIPs were obtained to evaluate the vascular anatomy. Multidetector CT imaging of the abdomen and pelvis was performed using the standard protocol during bolus administration of intravenous contrast. CONTRAST:  162mL ISOVUE-370 IOPAMIDOL (ISOVUE-370) INJECTION 76% COMPARISON:  Head CT today reported separately. Chest CTA 08/10/2018. CT Abdomen and Pelvis 07/28/2018. FINDINGS: CTA CHEST FINDINGS Cardiovascular: Good contrast bolus timing in the pulmonary arterial tree. Respiratory motion. No central pulmonary embolus. The upper lobe segmental branches also appear patent. The lower lobe branches are poorly evaluated due to motion. No cardiomegaly or pericardial effusion. Stable mild tortuosity of the thoracic aorta. Mediastinum/Nodes: No lymphadenopathy. Lungs/Pleura: Major airways are patent. Chronic atelectasis or scarring in the left lower lung. No pleural effusion or acute pulmonary opacity. Musculoskeletal: Bone detail substantially degraded by motion at some levels. The T10 compression fracture has been augmented. There are new T8 and T9 moderate compression fractures (series 27, image 77). There is a new nonacute appearing anterior left lower rib fracture with callus on series 25, image 114. Review of the MIP images confirms the above findings. CT ABDOMEN and PELVIS FINDINGS Intermittent motion artifact. Hepatobiliary: Stable and negative. Pancreas: Negative. Spleen: Negative. Adrenals/Urinary Tract: Adrenal glands and kidneys appear stable. Renal enhancement and contrast excretion appears symmetric and normal. There are  small bilateral  benign appearing renal cysts. Urinary bladder detail today is degraded by motion. Stomach/Bowel: Increased retained stool in the large bowel. Redundant sigmoid colon. No large bowel inflammation. Gas and fluid-filled small bowel loops measure at the upper limits of normal, 28 millimeters. Some detail is degraded by motion but there is no small bowel transition identified. The stomach is decompressed.  No free air, free fluid. Vascular/Lymphatic: Aortoiliac calcified atherosclerosis. Major arterial structures appear to remain patent. Grossly patent portal venous system. No lymphadenopathy. Reproductive: Negative. Other: No pelvic free fluid. Musculoskeletal: Bone detail intermittently degraded by motion. Augmented T12, L1, and L2 compression fractures. Mild L3 superior endplate compression since 07/28/2018. Review of the MIP images confirms the above findings. IMPRESSION: 1. Motion degraded CTA of the chest and CT Abdomen and Pelvis. 2. No central pulmonary embolus, but pulmonary artery branch detail is degraded by motion. 3. New compression fractures of T8, T9, and L3 since January which may be pathologic fractures in the setting of prostate cancer. Interval augmentation of the T10, T12, L1 and L2 fractures. 4. No other new abnormality identified in the chest, abdomen, or pelvis. Electronically Signed   By: Genevie Ann M.D.   On: 09/13/2018 02:36   Ct Abdomen Pelvis W Contrast  Result Date: 09/13/2018 CLINICAL DATA:  63 year old male with shortness of breath, possible small bowel obstruction. Metastatic prostate cancer. EXAM: CT ANGIOGRAPHY CHEST CT ABDOMEN AND PELVIS WITH CONTRAST TECHNIQUE: Multidetector CT imaging of the chest was performed using the standard protocol during bolus administration of intravenous contrast. Multiplanar CT image reconstructions and MIPs were obtained to evaluate the vascular anatomy. Multidetector CT imaging of the abdomen and pelvis was performed using the standard protocol during  bolus administration of intravenous contrast. CONTRAST:  172mL ISOVUE-370 IOPAMIDOL (ISOVUE-370) INJECTION 76% COMPARISON:  Head CT today reported separately. Chest CTA 08/10/2018. CT Abdomen and Pelvis 07/28/2018. FINDINGS: CTA CHEST FINDINGS Cardiovascular: Good contrast bolus timing in the pulmonary arterial tree. Respiratory motion. No central pulmonary embolus. The upper lobe segmental branches also appear patent. The lower lobe branches are poorly evaluated due to motion. No cardiomegaly or pericardial effusion. Stable mild tortuosity of the thoracic aorta. Mediastinum/Nodes: No lymphadenopathy. Lungs/Pleura: Major airways are patent. Chronic atelectasis or scarring in the left lower lung. No pleural effusion or acute pulmonary opacity. Musculoskeletal: Bone detail substantially degraded by motion at some levels. The T10 compression fracture has been augmented. There are new T8 and T9 moderate compression fractures (series 27, image 77). There is a new nonacute appearing anterior left lower rib fracture with callus on series 25, image 114. Review of the MIP images confirms the above findings. CT ABDOMEN and PELVIS FINDINGS Intermittent motion artifact. Hepatobiliary: Stable and negative. Pancreas: Negative. Spleen: Negative. Adrenals/Urinary Tract: Adrenal glands and kidneys appear stable. Renal enhancement and contrast excretion appears symmetric and normal. There are small bilateral benign appearing renal cysts. Urinary bladder detail today is degraded by motion. Stomach/Bowel: Increased retained stool in the large bowel. Redundant sigmoid colon. No large bowel inflammation. Gas and fluid-filled small bowel loops measure at the upper limits of normal, 28 millimeters. Some detail is degraded by motion but there is no small bowel transition identified. The stomach is decompressed.  No free air, free fluid. Vascular/Lymphatic: Aortoiliac calcified atherosclerosis. Major arterial structures appear to remain  patent. Grossly patent portal venous system. No lymphadenopathy. Reproductive: Negative. Other: No pelvic free fluid. Musculoskeletal: Bone detail intermittently degraded by motion. Augmented T12, L1, and L2 compression fractures. Mild L3 superior endplate  compression since 07/28/2018. Review of the MIP images confirms the above findings. IMPRESSION: 1. Motion degraded CTA of the chest and CT Abdomen and Pelvis. 2. No central pulmonary embolus, but pulmonary artery branch detail is degraded by motion. 3. New compression fractures of T8, T9, and L3 since January which may be pathologic fractures in the setting of prostate cancer. Interval augmentation of the T10, T12, L1 and L2 fractures. 4. No other new abnormality identified in the chest, abdomen, or pelvis. Electronically Signed   By: Genevie Ann M.D.   On: 09/13/2018 02:36   Dg Abdomen Acute W/chest  Result Date: 09/12/2018 CLINICAL DATA:  Right-sided rib pain EXAM: DG ABDOMEN ACUTE W/ 1V CHEST COMPARISON:  08/28/2018 FINDINGS: Single-view chest demonstrates platelike atelectasis in the left mid lung with streaky atelectasis at the left base. Stable cardiomediastinal silhouette. Supine and decubitus views of the abdomen demonstrate no definite free air. Mildly dilated loops of central small bowel measuring up to 3 cm with scattered distal bowel gas noted. Scoliosis of the spine with multiple treated compression fractures. IMPRESSION: 1. Low lung volumes with streaky atelectasis at the left mid to lower lung. 2. Mild air distention of central small bowel with distal gas present, findings could be secondary to ileus or partial bowel obstruction. Electronically Signed   By: Donavan Foil M.D.   On: 09/12/2018 23:44   None  Pending Labs Unresulted Labs (From admission, onward)    Start     Ordered   09/13/18 0138  Lactic acid, plasma  Add-on,   STAT     09/13/18 0139   09/13/18 0131  Urine culture  Add-on,   STAT     09/13/18 0130           Vitals/Pain Today's Vitals   09/12/18 2159 09/12/18 2230 09/12/18 2300 09/13/18 0357  BP:  125/81 123/73   Pulse:  96 100   Resp:      Temp:      TempSrc:      SpO2:  94% 93%   Weight:      Height:      PainSc: 10-Worst pain ever   7     Isolation Precautions No active isolations  Medications Medications  potassium chloride 10 mEq in 100 mL IVPB (10 mEq Intravenous New Bag/Given 09/13/18 0336)  iopamidol (ISOVUE-370) 76 % injection (has no administration in time range)  0.9 %  sodium chloride infusion (1,000 mLs Intravenous New Bag/Given 09/13/18 0116)  ipratropium-albuterol (DUONEB) 0.5-2.5 (3) MG/3ML nebulizer solution 3 mL (has no administration in time range)  docusate sodium (COLACE) capsule 100 mg (has no administration in time range)  carvedilol (COREG) tablet 3.125 mg (has no administration in time range)  albuterol (PROVENTIL HFA;VENTOLIN HFA) 108 (90 Base) MCG/ACT inhaler 2 puff (has no administration in time range)  abiraterone acetate (ZYTIGA) tablet 1,000 mg (has no administration in time range)  mometasone-formoterol (DULERA) 200-5 MCG/ACT inhaler 2 puff (has no administration in time range)  irbesartan (AVAPRO) tablet 300 mg (has no administration in time range)  magnesium citrate solution 1 Bottle (has no administration in time range)  methocarbamol (ROBAXIN) tablet 500 mg (has no administration in time range)  oxyCODONE (Oxy IR/ROXICODONE) immediate release tablet 5-10 mg (has no administration in time range)  oxyCODONE (OXYCONTIN) 12 hr tablet 15 mg (has no administration in time range)  predniSONE (DELTASONE) tablet 5 mg (has no administration in time range)  acetaminophen (TYLENOL) tablet 650 mg (has no administration in time range)  Or  acetaminophen (TYLENOL) suppository 650 mg (has no administration in time range)  ondansetron (ZOFRAN) tablet 4 mg (has no administration in time range)    Or  ondansetron (ZOFRAN) injection 4 mg (has no administration  in time range)  enoxaparin (LOVENOX) injection 40 mg (has no administration in time range)  fentaNYL (SUBLIMAZE) injection 50 mcg (50 mcg Intravenous Given 09/13/18 0114)  potassium chloride SA (K-DUR,KLOR-CON) CR tablet 40 mEq (40 mEq Oral Given 09/13/18 0105)  sodium chloride (PF) 0.9 % injection (10 mLs  Given 09/13/18 0238)  iopamidol (ISOVUE-370) 76 % injection 100 mL (100 mLs Intravenous Contrast Given 09/13/18 0112)  cefTRIAXone (ROCEPHIN) 1 g in sodium chloride 0.9 % 100 mL IVPB (0 g Intravenous Stopped 09/13/18 0251)  fentaNYL (SUBLIMAZE) injection 75 mcg (75 mcg Intravenous Given 09/13/18 0228)  sodium chloride 0.9 % bolus 1,000 mL (0 mLs Intravenous Stopped 09/13/18 0339)    Mobility walks

## 2018-09-13 NOTE — Progress Notes (Deleted)
Pharmacy Antibiotic Note  [name] Holder is a 62 y.o. male admitted on 09/12/2018 with UTI.  Pharmacy has been consulted for cefepime dosing.  Plan: Cefepime 1 Gm IV q8h F/u scr/cultures  Height: 5\' 8"  (172.7 cm) Weight: 175 lb (79.4 kg) IBW/kg (Calculated) : 68.4  Temp (24hrs), Avg:97.9 F (36.6 C), Min:97.9 F (36.6 C), Max:97.9 F (36.6 C)  Recent Labs  Lab 09/08/18 1140 09/12/18 2255  WBC 11.4* 8.7  CREATININE 0.81 0.99    Estimated Creatinine Clearance: 72.9 mL/min (by C-G formula based on SCr of 0.99 mg/dL).    Allergies  Allergen Reactions  . Amlodipine Swelling    edema  . Tetracycline Rash    Antimicrobials this admission: 2/18 rocephin >>  2/18 cefepime >>   Dose adjustments this admission:   Microbiology results:  BCx:   UCx:    Sputum:    MRSA PCR:  Thank you for allowing pharmacy to be a part of this patient's care.  Dorrene German 09/13/2018 3:10 AM

## 2018-09-13 NOTE — Consult Note (Signed)
Chief Complaint: Patient was seen in consultation today for compression fractures  Referring Physician(s): Dr. Jennette Kettle  Supervising Physician: Arne Cleveland  Patient Status: [facility] - In-pt  History of Present Illness: [name] Holder is a 63 y.o. male with past medical history of metastatic prostate cancer and pathologic fractures known to IR from recent kyphoplasty of T10 and T12 on 08/17/18  As well as to L1 and L2 08/26/18 by Dr. Barbie Banner. He presented to [facility] ED yesterday with confusion and concern for UTI.  Imaging also shows additional pathologic fractures.  IR consulted for kyphoplasty/vertebroplasty.  Past Medical History:  Diagnosis Date  . Allergic rhinitis   . At risk for sleep apnea    STOP-BANG SCORE = 5    (routed to pt's pcp 09-08-2018)  . Chronic constipation   . Chronic low back pain   . Chronic pain   . DOE (dyspnea on exertion)   . History of kidney stones   . History of recent pneumonia 08/10/2018   acute bronchopneumonia;  follow-up cxr 08-28-2018 in epic  . History of vertebral compression fracture    T10 and T12 s/p kyphoplasty  . Hx of colonic polyps   . Hyperplasia of prostate with lower urinary tract symptoms (LUTS)   . Hypertension   . Mild persistent asthma    followed by pcp  . Numbness in both legs    secondary to a fall, per pt has had work-up and couldn't find anything  . Osteopenia   . Prostate cancer metastatic to pelvis [facility]) urologist-- dr borden/  oncologist -- dr Alen Blew   dx 2014 advanced prostate cancer with pelvid adenopathy,  Stage T2c, Gleason 7;  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  . Wears glasses     Past Surgical History:  Procedure Laterality Date  . COLONOSCOPY    . IR KYPHO EA ADDL LEVEL THORACIC OR LUMBAR  08/17/2018  . IR KYPHO EA ADDL LEVEL THORACIC OR LUMBAR  08/26/2018  . IR KYPHO LUMBAR INC FX REDUCE BONE BX UNI/BIL CANNULATION INC/IMAGING  08/26/2018  . IR KYPHO THORACIC WITH BONE BIOPSY  08/17/2018  . IR RADIOLOGIST EVAL  & MGMT  08/11/2018  . IR RADIOLOGIST EVAL & MGMT  08/24/2018  . LYMPHADENECTOMY Left 01/05/2013   Procedure: ROBOTIC LYMPHADENECTOMY;  Surgeon: Dutch Gray, MD;  Location: [facility] ORS;  Service: Urology;  Laterality: Left;  . PROSTATE BIOPSY     11/12 positive biopsies  . ROTATOR CUFF REPAIR Left 2012  . UMBILICAL HERNIA REPAIR  09-02-2001   dr t. price _0     Allergies: Amlodipine and Tetracycline  Medications: Prior to Admission medications   Medication Sig Start Date End Date Taking? Authorizing Provider  abiraterone acetate (ZYTIGA) 250 MG tablet Take 1,000 mg by mouth daily.  08/17/17  Yes [provider]  albuterol (PROAIR HFA) 108 (90 Base) MCG/ACT inhaler Inhale 2 puffs into the lungs every 6 (six) hours as needed for wheezing or shortness of breath. 08/10/18  Yes Caccavale, Sophia, PA-C  albuterol (PROVENTIL) (2.5 MG/3ML) 0.083% nebulizer solution Take 3 mLs (2.5 mg total) by nebulization every 6 (six) hours as needed for wheezing or shortness of breath. 08/15/18  Yes Marrian Salvage, FNP  azelastine (ASTELIN) 0.1 % nasal spray Place 2 sprays into both nostrils every morning. Patient taking differently: Place 2 sprays into both nostrils daily as needed for allergies.  04/12/18  Yes Johnson, Clanford L, MD  CALCIUM-VITAMIN D PO Take 1 tablet by mouth 2 (two) times daily.  Yes [provider]  carvedilol (COREG) 3.125 MG tablet Take 1 tablet (3.125 mg total) by mouth 2 (two) times daily with a meal. 09/05/18  Yes [name], MD  cyanocobalamin (,VITAMIN B-12,) 1000 MCG/ML injection Inject 1,000 mcg into the muscle every 30 (thirty) days.  05/14/18  Yes [provider]  cyclobenzaprine (FLEXERIL) 5 MG tablet Take 5 mg by mouth 3 (three) times daily as needed for muscle spasms.  05/16/18  Yes [provider]  docusate sodium (COLACE) 100 MG capsule Take 100 mg by mouth 2 (two) times daily.    Yes [provider]  ipratropium-albuterol  (DUONEB) 0.5-2.5 (3) MG/3ML SOLN Take 3 mLs by nebulization every 4 (four) hours as needed. 08/19/18  Yes Marrian Salvage, FNP  Leuprolide Acetate (LUPRON IJ) Inject 1 Dose as directed every 4 (four) months. This is administered by [facility]   Yes [provider]  magnesium citrate SOLN Take 1 Bottle by mouth as needed for severe constipation.   Yes [provider]  methocarbamol (ROBAXIN) 500 MG tablet Take 500 mg by mouth 2 (two) times daily.    Yes [provider]  mometasone (NASONEX) 50 MCG/ACT nasal spray USE 1 TO 2 SPRAYS IN EACH NOSTRIL ONCE DAILY. Patient taking differently: Place 2 sprays into the nose daily as needed (allergies).  09/16/17  Yes [name], MD  morphine (MS CONTIN) 30 MG 12 hr tablet Take 1 tablet (30 mg total) by mouth every 12 (twelve) hours. 09/05/18  Yes Wyatt Portela, MD  NARCAN 4 MG/0.1ML LIQD nasal spray kit Place 0.4 mg into the nose once.  08/18/18  Yes [provider]  olmesartan (BENICAR) 40 MG tablet TAKE 1 TABLET DAILY 08/08/18  Yes [name], MD  oxyCODONE (OXY IR/ROXICODONE) 5 MG immediate release tablet Take 1 tablet (5 mg total) by mouth every 4 (four) hours as needed for severe pain (may take 1-2 tablets every 4 hours). Patient taking differently: Take 5-10 mg by mouth every 4 (four) hours as needed for severe pain (may take 1-2 tablets every 4 hours).  09/05/18  Yes Wyatt Portela, MD  Polyethylene Glycol 3350 (MIRALAX PO) Take 1 Dose by mouth daily as needed (constipation).    Yes [provider]  predniSONE (DELTASONE) 5 MG tablet Take 8 tablets (40 mg total) by mouth daily. Taper as directed Patient taking differently: Take 40 mg by mouth 2 (two) times daily. Takes while on Zytiga 08/28/18  Yes Hayden Rasmussen, MD  SYMBICORT 160-4.5 MCG/ACT inhaler Inhale 2 puffs into the lungs 2 (two) times daily. Patient taking differently: Inhale 2 puffs into the lungs 2 (two) times daily.  08/19/18   Yes [name], MD  SYRINGE-NEEDLE, DISP, 3 ML 3 ML MISC Use to inject b12 monthly. 04/19/18   [name], MD     Family History  Problem Relation Age of Onset  . Arthritis Mother   . Diabetes Mother   . Hypertension Mother   . Hyperlipidemia Mother   . Hypertension Father   . Prostate cancer Father        radiation in mid 17s  . Heart attack Father   . Cancer Sister        precancerous colon polyps, 6 in colon removed  . Stroke Maternal Grandfather     Social History   Socioeconomic History  . Marital status: Married    Spouse name: Not on file  . Number of children:  Not on file  . Years of education: 82  . Highest education level: Not on file  Occupational History  . 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 Financial resource strain: Not on file  . Food insecurity:    Worry: Not on file    Inability: Not on file  . Transportation needs:    Medical: Not on file    Non-medical: Not on file  Tobacco Use  . Smoking status: Former Smoker    Packs/day: 0.50    Years: 25.00    Pack years: 12.50    Types: Cigarettes    Last attempt to quit: 09/08/2012    Years since quitting: 6.0  . Smokeless tobacco: Never Used  Substance and Sexual Activity  . Alcohol use: Not Currently    Alcohol/week: 2.0 standard drinks    Types: 2 Glasses of wine per week    Comment: wine  1  glasses daily  . Drug use: Never  . Sexual activity: Not on file  Lifestyle  . Physical activity:    Days per week: Not on file    Minutes per session: Not on file  . Stress: Not on file  Relationships  . Social connections:    Talks on phone: Not on file    Gets together: Not on file    Attends religious service: Not on file    Active member of club or organization: Not on file    Attends meetings of clubs or organizations: Not on file    Relationship status: Not on file  Other Topics Concern  . Not on file  Social History  Narrative   Regular exercise-yes   Caffeine Use-yes     Review of Systems: A 12 point ROS discussed and pertinent positives are indicated in the HPI above.  All other systems are negative.  Review of Systems  Unable to perform ROS: Mental status change    Vital Signs: BP 122/85 (BP Location: Right Arm)   Pulse 98   Temp 98.8 F (37.1 C) (Oral)   Resp 18   Ht 5' 7" (1.702 m)   Wt 172 lb 14.9 oz (78.4 kg)   SpO2 98%   BMI 27.08 kg/m   Physical Exam Vitals signs and nursing note reviewed.  Constitutional:      Comments: Restless, communicative but confused  Cardiovascular:     Rate and Rhythm: Normal rate and regular rhythm.  Pulmonary:     Effort: Pulmonary effort is normal.     Breath sounds: Normal breath sounds.  Musculoskeletal:        General: Tenderness (back pain in the mid spine as well as lower lumbar area) present.  Neurological:     General: No focal deficit present.     Mental Status: He is alert. He is disoriented.          Imaging: Dg Chest 2 View  Result Date: 08/28/2018 CLINICAL DATA:  Shortness of breath. EXAM: CHEST - 2 VIEW COMPARISON:  Radiographs of August 15, 2018. FINDINGS: The heart size and mediastinal contours are within normal limits. No pneumothorax or pleural effusion is noted. Right lung is clear. Stable left basilar subsegmental atelectasis or scarring is noted. The visualized skeletal structures are unremarkable. IMPRESSION: Stable left basilar subsegmental atelectasis or scarring. Electronically Signed   By: Marijo Conception, M.D.   On: 08/28/2018 11:47   Dg Chest 2 View  Result Date: 08/15/2018  CLINICAL DATA:  Cough and shortness of breath. Follow-up pneumonia. History of metastatic prostate cancer. EXAM: CHEST - 2 VIEW COMPARISON:  Chest radiographs and CTA 08/10/2018 FINDINGS: The cardiomediastinal silhouette is unchanged with normal heart size. Curvilinear opacities in the lingula and basilar left lower lobe are similar to the  prior studies. No acute airspace consolidation, edema, pleural effusion, pneumothorax is identified. Vertebral fractures are again seen in the lower thoracic and lumbar spine. IMPRESSION: Chronic atelectasis or scarring in the left lung base. No evidence of active cardiopulmonary disease. Electronically Signed   By: Logan Bores M.D.   On: 08/15/2018 11:29   Ct Head W Or Wo Contrast  Result Date: 09/13/2018 CLINICAL DATA:  Initial evaluation for hallucinations. History of metastatic prostate cancer. EXAM: CT HEAD WITHOUT AND WITH CONTRAST TECHNIQUE: Contiguous axial images were obtained from the base of the skull through the vertex without and with intravenous contrast CONTRAST:  141m ISOVUE-370 IOPAMIDOL (ISOVUE-370) INJECTION 76% COMPARISON:  Prior MRI from 04/11/2018 FINDINGS: Brain: Examination limited by motion artifact. Generalized age-related cerebral atrophy. No acute intracranial hemorrhage. No acute large vessel territory infarct. No mass lesion, midline shift or mass effect. No hydrocephalus. No extra-axial fluid collection. No appreciable abnormal enhancement. Vascular: No hyperdense vessel. Scattered vascular calcifications noted within the carotid siphons. Normal intravascular enhancement seen throughout the intracranial vasculature following contrast administration. Skull: Scalp soft tissues demonstrate no acute finding. Calvarium intact. No definite focal osseous lesions on this limited motion degraded exam. Corticated lucency extending through the anterior arch of C1, chronic in appearance, likely congenital. Sinuses/Orbits: Globes and orbital soft tissues within normal limits. Paranasal sinuses and mastoid air cells are grossly clear. Other: None. IMPRESSION: 1. Technically limited exam due to extensive motion artifact. 2. Grossly negative head CT with and without contrast. No acute intracranial abnormality. No mass lesion or abnormal enhancement. Electronically Signed   By: BJeannine BogaM.D.   On: 09/13/2018 02:17   Ct Angio Chest Pe W And/or Wo Contrast  Result Date: 09/13/2018 CLINICAL DATA:  63year old male with shortness of breath, possible small bowel obstruction. Metastatic prostate cancer. EXAM: CT ANGIOGRAPHY CHEST CT ABDOMEN AND PELVIS WITH CONTRAST TECHNIQUE: Multidetector CT imaging of the chest was performed using the standard protocol during bolus administration of intravenous contrast. Multiplanar CT image reconstructions and MIPs were obtained to evaluate the vascular anatomy. Multidetector CT imaging of the abdomen and pelvis was performed using the standard protocol during bolus administration of intravenous contrast. CONTRAST:  1060mISOVUE-370 IOPAMIDOL (ISOVUE-370) INJECTION 76% COMPARISON:  Head CT today reported separately. Chest CTA 08/10/2018. CT Abdomen and Pelvis 07/28/2018. FINDINGS: CTA CHEST FINDINGS Cardiovascular: Good contrast bolus timing in the pulmonary arterial tree. Respiratory motion. No central pulmonary embolus. The upper lobe segmental branches also appear patent. The lower lobe branches are poorly evaluated due to motion. No cardiomegaly or pericardial effusion. Stable mild tortuosity of the thoracic aorta. Mediastinum/Nodes: No lymphadenopathy. Lungs/Pleura: Major airways are patent. Chronic atelectasis or scarring in the left lower lung. No pleural effusion or acute pulmonary opacity. Musculoskeletal: Bone detail substantially degraded by motion at some levels. The T10 compression fracture has been augmented. There are new T8 and T9 moderate compression fractures (series 27, image 77). There is a new nonacute appearing anterior left lower rib fracture with callus on series 25, image 114. Review of the MIP images confirms the above findings. CT ABDOMEN and PELVIS FINDINGS Intermittent motion artifact. Hepatobiliary: Stable and negative. Pancreas: Negative. Spleen: Negative. Adrenals/Urinary Tract: Adrenal glands and kidneys  appear stable.  Renal enhancement and contrast excretion appears symmetric and normal. There are small bilateral benign appearing renal cysts. Urinary bladder detail today is degraded by motion. Stomach/Bowel: Increased retained stool in the large bowel. Redundant sigmoid colon. No large bowel inflammation. Gas and fluid-filled small bowel loops measure at the upper limits of normal, 28 millimeters. Some detail is degraded by motion but there is no small bowel transition identified. The stomach is decompressed.  No free air, free fluid. Vascular/Lymphatic: Aortoiliac calcified atherosclerosis. Major arterial structures appear to remain patent. Grossly patent portal venous system. No lymphadenopathy. Reproductive: Negative. Other: No pelvic free fluid. Musculoskeletal: Bone detail intermittently degraded by motion. Augmented T12, L1, and L2 compression fractures. Mild L3 superior endplate compression since 07/28/2018. Review of the MIP images confirms the above findings. IMPRESSION: 1. Motion degraded CTA of the chest and CT Abdomen and Pelvis. 2. No central pulmonary embolus, but pulmonary artery branch detail is degraded by motion. 3. New compression fractures of T8, T9, and L3 since January which may be pathologic fractures in the setting of prostate cancer. Interval augmentation of the T10, T12, L1 and L2 fractures. 4. No other new abnormality identified in the chest, abdomen, or pelvis. Electronically Signed   By: Genevie Ann M.D.   On: 09/13/2018 02:36   Ct Abdomen Pelvis W Contrast  Result Date: 09/13/2018 CLINICAL DATA:  63 year old male with shortness of breath, possible small bowel obstruction. Metastatic prostate cancer. EXAM: CT ANGIOGRAPHY CHEST CT ABDOMEN AND PELVIS WITH CONTRAST TECHNIQUE: Multidetector CT imaging of the chest was performed using the standard protocol during bolus administration of intravenous contrast. Multiplanar CT image reconstructions and MIPs were obtained to evaluate the vascular anatomy.  Multidetector CT imaging of the abdomen and pelvis was performed using the standard protocol during bolus administration of intravenous contrast. CONTRAST:  150m ISOVUE-370 IOPAMIDOL (ISOVUE-370) INJECTION 76% COMPARISON:  Head CT today reported separately. Chest CTA 08/10/2018. CT Abdomen and Pelvis 07/28/2018. FINDINGS: CTA CHEST FINDINGS Cardiovascular: Good contrast bolus timing in the pulmonary arterial tree. Respiratory motion. No central pulmonary embolus. The upper lobe segmental branches also appear patent. The lower lobe branches are poorly evaluated due to motion. No cardiomegaly or pericardial effusion. Stable mild tortuosity of the thoracic aorta. Mediastinum/Nodes: No lymphadenopathy. Lungs/Pleura: Major airways are patent. Chronic atelectasis or scarring in the left lower lung. No pleural effusion or acute pulmonary opacity. Musculoskeletal: Bone detail substantially degraded by motion at some levels. The T10 compression fracture has been augmented. There are new T8 and T9 moderate compression fractures (series 27, image 77). There is a new nonacute appearing anterior left lower rib fracture with callus on series 25, image 114. Review of the MIP images confirms the above findings. CT ABDOMEN and PELVIS FINDINGS Intermittent motion artifact. Hepatobiliary: Stable and negative. Pancreas: Negative. Spleen: Negative. Adrenals/Urinary Tract: Adrenal glands and kidneys appear stable. Renal enhancement and contrast excretion appears symmetric and normal. There are small bilateral benign appearing renal cysts. Urinary bladder detail today is degraded by motion. Stomach/Bowel: Increased retained stool in the large bowel. Redundant sigmoid colon. No large bowel inflammation. Gas and fluid-filled small bowel loops measure at the upper limits of normal, 28 millimeters. Some detail is degraded by motion but there is no small bowel transition identified. The stomach is decompressed.  No free air, free fluid.  Vascular/Lymphatic: Aortoiliac calcified atherosclerosis. Major arterial structures appear to remain patent. Grossly patent portal venous system. No lymphadenopathy. Reproductive: Negative. Other: No pelvic free fluid. Musculoskeletal: Bone detail intermittently degraded  by motion. Augmented T12, L1, and L2 compression fractures. Mild L3 superior endplate compression since 07/28/2018. Review of the MIP images confirms the above findings. IMPRESSION: 1. Motion degraded CTA of the chest and CT Abdomen and Pelvis. 2. No central pulmonary embolus, but pulmonary artery branch detail is degraded by motion. 3. New compression fractures of T8, T9, and L3 since January which may be pathologic fractures in the setting of prostate cancer. Interval augmentation of the T10, T12, L1 and L2 fractures. 4. No other new abnormality identified in the chest, abdomen, or pelvis. Electronically Signed   By: Genevie Ann M.D.   On: 09/13/2018 02:36   Dg Abdomen Acute W/chest  Result Date: 09/12/2018 CLINICAL DATA:  Right-sided rib pain EXAM: DG ABDOMEN ACUTE W/ 1V CHEST COMPARISON:  08/28/2018 FINDINGS: Single-view chest demonstrates platelike atelectasis in the left mid lung with streaky atelectasis at the left base. Stable cardiomediastinal silhouette. Supine and decubitus views of the abdomen demonstrate no definite free air. Mildly dilated loops of central small bowel measuring up to 3 cm with scattered distal bowel gas noted. Scoliosis of the spine with multiple treated compression fractures. IMPRESSION: 1. Low lung volumes with streaky atelectasis at the left mid to lower lung. 2. Mild air distention of central small bowel with distal gas present, findings could be secondary to ileus or partial bowel obstruction. Electronically Signed   By: Donavan Foil M.D.   On: 09/12/2018 23:44   Ir Kypho Thoracic With Bone Biopsy  Result Date: 08/17/2018 INDICATION: T10 and T12 compression fractures EXAM: T10 AND T12 KYPHOPLASTY AND BONE  BIOPSY COMPARISON:  07/18/2018 MRI MEDICATIONS: As antibiotic prophylaxis, Ancef 2 g was ordered pre-procedure and administered intravenously within 1 hour of incision. ANESTHESIA/SEDATION: Moderate (conscious) sedation was employed during this procedure. A total of Versed 4 mg and Fentanyl 200 mcg was administered intravenously. Moderate Sedation Time: 45 minutes. The patient's level of consciousness and vital signs were monitored continuously by radiology nursing throughout the procedure under my direct supervision. FLUOROSCOPY TIME:  Fluoroscopy Time: 11 minutes 30 seconds (938 mGy) COMPLICATIONS: None immediate. PROCEDURE: Following a full explanation of the procedure along with the potential associated complications, an informed witnessed consent was obtained. The back was prepped and draped in a sterile fashion. Maximal barrier technique was utilized. 1% lidocaine was utilized for local anesthesia. Under fluoroscopic guidance, a spinal needle was advanced to the left T10 pedicle. 1% lidocaine was infiltrated. The 10 gauge expressed needle was then advanced through the left T10 pedicle into the vertebral body. A bone drill was advanced into the vertebral body. Bone fragments were sent for biopsy. The 15 2 balloon was then inserted. The identical procedure was performed at T12 via the right pedicle. Again, a 15 2 balloon was inserted. Both balloons were insufflated to 200 pounds per square inch. The balloons were removed. The cement deposition trocar was inserted into each cannula at T10 and T12. Cement was then injected filling the vertebral body with without significant extraosseous cement. Trace cement entered a prevertebral vein at T12. It was stable without embolizing. The trocars were removed. Hemostasis was achieved with direct pressure. IMPRESSION: Successful T10 and T12 kyphoplasty and bone biopsy. Electronically Signed   By: Marybelle Killings M.D.   On: 08/17/2018 15:15   Ir Kypho Lumbar Inc Fx Reduce  Bone Bx Uni/bil Cannulation Inc/imaging  Result Date: 08/26/2018 INDICATION: Painful L1 and L2 compression fractures EXAM: IR KYPHO VERTEBRAL LUMBAR AUGMENTATION; IR KYPHO VERTEBRAL ADDL AGMENTATION MEDICATIONS: As antibiotic prophylaxis,  Ancef 2 g was ordered pre-procedure and administered intravenously within 1 hour of incision. ANESTHESIA/SEDATION: Versed 3.5 mg IV; Fentanyl 150 mcg IV Moderate Sedation Time:  39 minutes The patient was continuously monitored during the procedure by the interventional radiology nurse under my direct supervision. FLUOROSCOPY TIME:  Fluoroscopy Time: 8 minutes 42 seconds (161 mGy) COMPLICATIONS: None immediate. PROCEDURE: Informed written consent was obtained from the patient after a thorough discussion of the procedural risks, benefits and alternatives. All questions were addressed. Maximal Sterile Barrier Technique was utilized including caps, mask, sterile gowns, sterile gloves, sterile drape, hand hygiene and skin antiseptic. A timeout was performed prior to the initiation of the procedure. The back was prepped with ChloraPrep in a sterile fashion, and a sterile drape was applied covering the operative field. A sterile gown and sterile gloves were used for the procedure. Under fluoroscopic guidance, a 10 gauge needle was inserted into the L1 vertebral body via a right transpedicular approach. A cement trocar was utilized to obtain a biopsy specimen. This was exchanged for the drill device and finally for the 15 2 balloon. This was advanced into the vertebral body and insufflated to 200 pounds per square inch. The identical procedure was performed at L2 via the left pedicle. The balloons were removed and cement was then injected utilizing the cement deposition system and cement trocars. Cement filled the L1 and L2 vertebral bodies. A small amount of extravasated cement in the L1-2 disc is noted. There is no venous cement or cement posterior to the posterior wall of the  vertebral bodies. The cement trocars were removed. The outer cannula were removed. Hemostasis was achieved with direct pressure. FINDINGS: Imaging documents L1 kyphoplasty via right pedicle and L2 kyphoplasty via left pedicle. Imaging demonstrates imaging to document biopsy acquisition. Final image demonstrates cement within the L1 and L2 vertebral body. A small amount of cement has extravasated into the L1-2 disc. IMPRESSION: Successful L1 and L2 kyphoplasty with bone biopsy at each level. Electronically Signed   By: Marybelle Killings M.D.   On: 08/26/2018 13:15   Ir Kypho Ea Addl Level Thoracic Or Lumbar  Result Date: 08/26/2018 INDICATION: Painful L1 and L2 compression fractures EXAM: IR KYPHO VERTEBRAL LUMBAR AUGMENTATION; IR KYPHO VERTEBRAL ADDL AGMENTATION MEDICATIONS: As antibiotic prophylaxis, Ancef 2 g was ordered pre-procedure and administered intravenously within 1 hour of incision. ANESTHESIA/SEDATION: Versed 3.5 mg IV; Fentanyl 150 mcg IV Moderate Sedation Time:  39 minutes The patient was continuously monitored during the procedure by the interventional radiology nurse under my direct supervision. FLUOROSCOPY TIME:  Fluoroscopy Time: 8 minutes 42 seconds (096 mGy) COMPLICATIONS: None immediate. PROCEDURE: Informed written consent was obtained from the patient after a thorough discussion of the procedural risks, benefits and alternatives. All questions were addressed. Maximal Sterile Barrier Technique was utilized including caps, mask, sterile gowns, sterile gloves, sterile drape, hand hygiene and skin antiseptic. A timeout was performed prior to the initiation of the procedure. The back was prepped with ChloraPrep in a sterile fashion, and a sterile drape was applied covering the operative field. A sterile gown and sterile gloves were used for the procedure. Under fluoroscopic guidance, a 10 gauge needle was inserted into the L1 vertebral body via a right transpedicular approach. A cement trocar was  utilized to obtain a biopsy specimen. This was exchanged for the drill device and finally for the 15 2 balloon. This was advanced into the vertebral body and insufflated to 200 pounds per square inch. The identical procedure was performed  at L2 via the left pedicle. The balloons were removed and cement was then injected utilizing the cement deposition system and cement trocars. Cement filled the L1 and L2 vertebral bodies. A small amount of extravasated cement in the L1-2 disc is noted. There is no venous cement or cement posterior to the posterior wall of the vertebral bodies. The cement trocars were removed. The outer cannula were removed. Hemostasis was achieved with direct pressure. FINDINGS: Imaging documents L1 kyphoplasty via right pedicle and L2 kyphoplasty via left pedicle. Imaging demonstrates imaging to document biopsy acquisition. Final image demonstrates cement within the L1 and L2 vertebral body. A small amount of cement has extravasated into the L1-2 disc. IMPRESSION: Successful L1 and L2 kyphoplasty with bone biopsy at each level. Electronically Signed   By: Marybelle Killings M.D.   On: 08/26/2018 13:15   Ir Kypho Ea Addl Level Thoracic Or Lumbar  Result Date: 08/17/2018 INDICATION: T10 and T12 compression fractures EXAM: T10 AND T12 KYPHOPLASTY AND BONE BIOPSY COMPARISON:  07/18/2018 MRI MEDICATIONS: As antibiotic prophylaxis, Ancef 2 g was ordered pre-procedure and administered intravenously within 1 hour of incision. ANESTHESIA/SEDATION: Moderate (conscious) sedation was employed during this procedure. A total of Versed 4 mg and Fentanyl 200 mcg was administered intravenously. Moderate Sedation Time: 45 minutes. The patient's level of consciousness and vital signs were monitored continuously by radiology nursing throughout the procedure under my direct supervision. FLUOROSCOPY TIME:  Fluoroscopy Time: 11 minutes 30 seconds (093 mGy) COMPLICATIONS: None immediate. PROCEDURE: Following a full  explanation of the procedure along with the potential associated complications, an informed witnessed consent was obtained. The back was prepped and draped in a sterile fashion. Maximal barrier technique was utilized. 1% lidocaine was utilized for local anesthesia. Under fluoroscopic guidance, a spinal needle was advanced to the left T10 pedicle. 1% lidocaine was infiltrated. The 10 gauge expressed needle was then advanced through the left T10 pedicle into the vertebral body. A bone drill was advanced into the vertebral body. Bone fragments were sent for biopsy. The 15 2 balloon was then inserted. The identical procedure was performed at T12 via the right pedicle. Again, a 15 2 balloon was inserted. Both balloons were insufflated to 200 pounds per square inch. The balloons were removed. The cement deposition trocar was inserted into each cannula at T10 and T12. Cement was then injected filling the vertebral body with without significant extraosseous cement. Trace cement entered a prevertebral vein at T12. It was stable without embolizing. The trocars were removed. Hemostasis was achieved with direct pressure. IMPRESSION: Successful T10 and T12 kyphoplasty and bone biopsy. Electronically Signed   By: Marybelle Killings M.D.   On: 08/17/2018 15:15   Ir Radiologist Eval & Mgmt  Result Date: 08/24/2018 Please refer to notes tab for details about interventional procedure. (Op Note)   Labs:  CBC: Recent Labs    08/26/18 0815 08/28/18 1203 09/08/18 1140 09/12/18 2255  WBC 7.5 8.1 11.4* 8.7  HGB 11.8* 13.7 13.9 13.1  HCT 35.3* 41.5 40.6 38.5*  PLT 242 277 383 264    COAGS: Recent Labs    08/17/18 1121 08/26/18 0815  INR 0.95 0.94    BMP: Recent Labs    08/26/18 0815 08/28/18 1203 09/08/18 1140 09/12/18 2255 09/13/18 0554  NA 135 137 137 135  --   K 3.4* 3.6 3.2* 2.7* 3.1*  CL 104 103 102 103  --   CO2 _0 --   GLUCOSE 106* 109* 145* 105*  --  BUN _0 7*  --   CALCIUM 9.5  9.9 9.8 9.4  --   CREATININE 0.76 0.88 0.81 0.99  --   GFRNONAA >60 >60 >60 >60  --   GFRAA >60 >60 >60 >60  --     LIVER FUNCTION TESTS: Recent Labs    09/16/17 1056 04/10/18 2343 08/10/18 1359 09/12/18 2255  BILITOT 1.2 0.8 0.7 1.1  AST 49* 21 18 34  ALT 107* 20 22 56*  ALKPHOS 49 39 93 139*  PROT 6.5 7.0 6.1* 5.7*  ALBUMIN 4.0 4.2 3.9 3.6    TUMOR MARKERS: No results for input(s): AFPTM, CEA, CA199, CHROMGRNA in the last 8760 hours.  Assessment and Plan: Pathologic fractures of T8, T9, L3 Known to IR from previous kyphoplasty of L1 and L2 on 08/26/18 and T10 and T12 on 08/17/18. Case reviewed by Dr. Vernard Gambles.  Patient would potentially be a candidate for further intervention with kyphoplasty.  Will need to confirm no further imaging is needed.  Patient will need prior authorization.  Both mental and medical status are current barriers to intervention.  Patient confused and restless; would not be able to lie flat or still for procedure at this time.  Work-up underway for possible UTI.  IR will follow peripherally and monitor status for now.  If patient is discharged, procedure can be arranged as outpatient once stable and if warranted.   Thank you for this interesting consult.  I greatly enjoyed meeting [name] Holder and look forward to participating in their care.  A copy of this report was sent to the requesting provider on this date.  Electronically Signed: Docia Barrier, PA 09/13/2018, 2:23 PM   I spent a total of    15 Minutes in face to face in clinical consultation, greater than 50% of which was counseling/coordinating care for compression fractures.

## 2018-09-13 NOTE — Progress Notes (Signed)
Pharmacy Brief note: Zytiga   Abiraterone (Zytiga) Hold Criteria:  ALT or AST > 5x ULN  TBili > 3x ULN  Active infection  Note that patient should be continued on prednisone if was receiving prednisone as outpatient.  Will d/c Zytiga from [facility] d/t active infection.  Thanks Dorrene German 09/13/2018 5:00 AM

## 2018-09-13 NOTE — Progress Notes (Signed)
Patient removed IV and got out of bed; exhibiting confusion and some anger.    Contacted wife to let her know of his change in orientation.

## 2018-09-14 DIAGNOSIS — R52 Pain, unspecified: Secondary | ICD-10-CM

## 2018-09-14 DIAGNOSIS — S22000A Wedge compression fracture of unspecified thoracic vertebra, initial encounter for closed fracture: Secondary | ICD-10-CM

## 2018-09-14 DIAGNOSIS — Z515 Encounter for palliative care: Secondary | ICD-10-CM

## 2018-09-14 LAB — COMPREHENSIVE METABOLIC PANEL
ALT: 54 U/L — ABNORMAL HIGH (ref 0–44)
AST: 29 U/L (ref 15–41)
Albumin: 3.3 g/dL — ABNORMAL LOW (ref 3.5–5.0)
Alkaline Phosphatase: 119 U/L (ref 38–126)
Anion gap: 8 (ref 5–15)
BUN: 10 mg/dL (ref 8–23)
CO2: 19 mmol/L — ABNORMAL LOW (ref 22–32)
Calcium: 8.5 mg/dL — ABNORMAL LOW (ref 8.9–10.3)
Chloride: 111 mmol/L (ref 98–111)
Creatinine, Ser: 0.73 mg/dL (ref 0.61–1.24)
GFR calc Af Amer: >60 mL/min (ref >60)
GFR calc non Af Amer: >60 mL/min (ref >60)
Glucose, Bld: 89 mg/dL (ref 70–99)
Potassium: 3.9 mmol/L (ref 3.5–5.1)
Sodium: 138 mmol/L (ref 135–145)
Total Bilirubin: 1.1 mg/dL (ref 0.3–1.2)
Total Protein: 5.4 g/dL — ABNORMAL LOW (ref 6.5–8.1)

## 2018-09-14 LAB — SURGICAL PCR SCREEN
MRSA, PCR: NEGATIVE
Staphylococcus aureus: NEGATIVE

## 2018-09-14 LAB — CBC
HCT: 35 % — ABNORMAL LOW (ref 39.0–52.0)
Hemoglobin: 11.4 g/dL — ABNORMAL LOW (ref 13.0–17.0)
MCH: 32.1 pg (ref 26.0–34.0)
MCHC: 32.6 g/dL (ref 30.0–36.0)
MCV: 98.6 fL (ref 80.0–100.0)
Platelets: 202 10*3/uL (ref 150–400)
RBC: 3.55 MIL/uL — ABNORMAL LOW (ref 4.22–5.81)
RDW: 11.9 % (ref 11.5–15.5)
WBC: 6.7 10*3/uL (ref 4.0–10.5)
nRBC: 0 % (ref 0.0–0.2)

## 2018-09-14 LAB — PSA: Prostatic Specific Antigen: 0.48 ng/mL (ref 0.00–4.00)

## 2018-09-14 LAB — MAGNESIUM: Magnesium: 2.4 mg/dL (ref 1.7–2.4)

## 2018-09-14 MED ORDER — LIDOCAINE 5 % EX PTCH
1.0000 | MEDICATED_PATCH | CUTANEOUS | Status: DC
  Administered 2018-09-14 – 2018-09-22 (×9): 1 via TRANSDERMAL
  Filled 2018-09-14 (×9): qty 1

## 2018-09-14 MED ORDER — OXYCODONE HCL 5 MG PO TABS
5.0000 mg | ORAL_TABLET | ORAL | Status: DC | PRN
  Administered 2018-09-14 – 2018-09-22 (×25): 5 mg via ORAL
  Filled 2018-09-14 (×26): qty 1

## 2018-09-14 MED ORDER — HYDRALAZINE HCL 20 MG/ML IJ SOLN
5.0000 mg | Freq: Once | INTRAMUSCULAR | Status: AC
  Administered 2018-09-14: 5 mg via INTRAVENOUS
  Filled 2018-09-14: qty 1

## 2018-09-14 MED ORDER — AMOXICILLIN 250 MG PO CAPS
500.0000 mg | ORAL_CAPSULE | Freq: Three times a day (TID) | ORAL | Status: DC
  Administered 2018-09-14: 500 mg via ORAL
  Filled 2018-09-14: qty 2

## 2018-09-14 MED ORDER — BISACODYL 10 MG RE SUPP: 10.0000 mg | Freq: Every day | RECTAL | Status: DC | PRN

## 2018-09-14 MED ORDER — HYDROMORPHONE HCL 1 MG/ML IJ SOLN
0.5000 mg | INTRAMUSCULAR | Status: DC | PRN
  Administered 2018-09-14 – 2018-09-18 (×8): 0.5 mg via INTRAVENOUS
  Filled 2018-09-14 (×9): qty 0.5

## 2018-09-14 MED ORDER — SENNOSIDES-DOCUSATE SODIUM 8.6-50 MG PO TABS
1.0000 | ORAL_TABLET | Freq: Two times a day (BID) | ORAL | Status: DC
  Administered 2018-09-14 – 2018-09-19 (×11): 1 via ORAL
  Filled 2018-09-14 (×14): qty 1

## 2018-09-14 MED ORDER — VANCOMYCIN HCL IN DEXTROSE 1-5 GM/200ML-% IV SOLN
1000.0000 mg | Freq: Two times a day (BID) | INTRAVENOUS | Status: AC
  Administered 2018-09-14 – 2018-09-15 (×3): 1000 mg via INTRAVENOUS
  Filled 2018-09-14 (×3): qty 200

## 2018-09-14 MED ORDER — ABIRATERONE ACETATE 250 MG PO TABS
1000.0000 mg | ORAL_TABLET | Freq: Every day | ORAL | Status: DC
  Administered 2018-09-14 – 2018-09-21 (×7): 1000 mg via ORAL

## 2018-09-14 MED ORDER — MUPIROCIN 2 % EX OINT
1.0000 "application " | TOPICAL_OINTMENT | Freq: Two times a day (BID) | CUTANEOUS | Status: AC
  Administered 2018-09-14 – 2018-09-18 (×9): 1 via NASAL
  Filled 2018-09-14: qty 22

## 2018-09-14 NOTE — Progress Notes (Addendum)
PROGRESS NOTE  [name] Holder GGY:[id number] DOB: 1955/08/15 DOA: 09/12/2018 PCP: [name], MD  Dr. Alen Blew  Brief History   63 year old man PMH castration resistant prostate cancer, multiple compression fractures causing chronic pain, history of kyphoplasty T10, T12, L1, L2, recently started on MS Contin for chronic pain, however developed hallucinations and sedation and was sent to the hospital for further evaluation for acute encephalopathy.  A & P  Acute encephalopathy, delirium secondary to adverse reaction to MS Contin, complicated by chronic back pain.  UTI was questioned on admission. --Patient never had any urinary symptoms.  His symptoms have significantly improved even though urine shows enterococcus and he has been treated with ceftriaxone, arguing against infection.  In fact his history is most suggestive of adverse reaction to MS Contin.  Bacteriuria --Given that the patient is having a urologic procedure it is prudent to treat with antibiotics as directed per Dr. Alinda Money.  New vertebral compression fractures superimposed on chronic back pain secondary to compression fracture T10, T12.  Status post kyphoplasty with bone biopsy which showed no malignancy. --MS Contin stopped, started on OxyContin given previous tolerance for oxycodone.  Appreciate palliative medicine adjusting medications for pain control. --Can follow-up with interventional radiology as an outpatient for consideration of kyphoplasty  Scheduled for transurethral resection of bladder tumor 2/20 with Dr. Alinda Money --Plan as per urology.  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   --Currently on Zytiga daily with prednisone 5 mg daily. Status post kyphoplasty January 31. --Zytiga held by the pharmacy team automatically due to question of possible infection.  However as he has no infection this can be resumed as per Dr. Alinda Money.   Improving   DVT prophylaxis: SCDs Code  Status: Full Family Communication: wife at bedside Disposition Plan: home    [name], MD  [facility] Direct contact: see [email or web address]  7PM-7AM contact night coverage as above 09/14/2018, 10:15 AM  LOS: 1 day   Consultants  . Interventional radiology . Urology  Procedures  .   Antibiotics  .   Interval History/Subjective  Noted to be confused overnight, agitated, disorganized thinking, delusions Feels much better today.  Still having some hallucinations and confusion but now aware of hallucinations.  Wife at bedside reports he is much better.  Never had any  Objective   Vitals:  Vitals:   09/14/18 0737 09/14/18 0825  BP:  (!) 162/92  Pulse:  91  Resp:  18  Temp:  98.4 F (36.9 C)  SpO2: 97% 98%    Exam:  Constitutional:  . Appears calm and comfortable Eyes:  . pupils and irises appear normal ENMT:  . grossly normal hearing  Respiratory:  . CTA bilaterally, no w/r/r.  . Respiratory effort normal.  Cardiovascular:  . RRR, no m/r/g . No LE extremity edema   Psychiatric:  . Mental status o Mood, affect appropriate . Follows commands appropriately  I have personally reviewed the following:   Today's Data  . Complete metabolic panel unremarkable except for modest elevation of ALT at 54. . CBC unremarkable. [name] PSA 0.48.  Lab Data  . Noted  Micro Data  . Urine culture with enterococcus  Imaging  . Chest x-ray no acute disease. . CT head no acute disease. . CTA chest no PE. . CT abdomen pelvis showed new compression fractures at T8, T9, L3  Cardiology Data  .   Other Data  .   Scheduled Meds: . carvedilol  3.125 mg Oral BID WC  .  docusate sodium  100 mg Oral BID  . enoxaparin (LOVENOX) injection  40 mg Subcutaneous Q24H  . ipratropium-albuterol  3 mL Nebulization TID  . irbesartan  300 mg Oral Daily  . methocarbamol  500 mg Oral BID  . mometasone-formoterol  2 puff Inhalation BID  . oxyCODONE  15 mg Oral Q12H  .  predniSONE  5 mg Oral BID WC   Continuous Infusions: . sodium chloride 10 mL/hr at 09/13/18 1020  . cefTRIAXone (ROCEPHIN)  IV 1 g (09/13/18 2208)    Principal Problem:   Delirium Active Problems:   Essential hypertension   History of prostate cancer   Secondary malignant neoplasm of bone and bone marrow (HCC)   Pathologic compression fracture of spine, initial encounter (HCC)   Acute lower UTI   Cancer associated pain   Bladder cancer (HCC)   Acute encephalopathy   LOS: 1 day    Time 35 minutes, greater than 50% discussion with wife, patient at bedside and discussion with Dr. Sherilyn Banker.

## 2018-09-14 NOTE — Consult Note (Signed)
Urology Consult   Physician requesting consult: Dr. Sarajane Jews  Reason for consult: Bladder tumor  History of Present Illness: [name] Holder is a 63 y.o. well known to me with castrate resistant metastatic prostate cancer treated with androgen deprivation and abiraterone/prednisone.  He recently was noted to have compression fractures of the thoracic and lumbar spine s/p kyphoplasty about 2 weeks ago.  Although suspicion for prostate cancer pathologic lesions was low due to his PSA response to treatment, biopsies were performed of his vertebrae at the time of kyphoplasty and were negative.  He also has a known small bladder tumor and is scheduled for TURBT tomorrow tentatively.  He presented to the hospital yesterday due to increasing back pain and imaging has suggested new compression fractures of the thoracic and lumbar spine.  He has been treated with narcotic pain medication and has been admitted due to confusion/hallucinations and for pain control.  IR saw him yesterday and has suggested proceeding with repeat kyphoplasty at the new sites once he has stabilized, possibly as an outpatient.  Pain is localized to his lumbar back, right worse than left.  He denies a history of voiding or storage urinary symptoms, hematuria, UTIs, STDs, urolithiasis, GU malignancy/trauma/surgery.  Past Medical History:  Diagnosis Date  . Allergic rhinitis   . At risk for sleep apnea    STOP-BANG SCORE = 5    (routed to pt's pcp 09-08-2018)  . Chronic constipation   . Chronic low back pain   . Chronic pain   . DOE (dyspnea on exertion)   . History of kidney stones   . History of recent pneumonia 08/10/2018   acute bronchopneumonia;  follow-up cxr 08-28-2018 in epic  . History of vertebral compression fracture    T10 and T12 s/p kyphoplasty  . Hx of colonic polyps   . Hyperplasia of prostate with lower urinary tract symptoms (LUTS)   . Hypertension   . Mild persistent asthma    followed by pcp  .  Numbness in both legs    secondary to a fall, per pt has had work-up and couldn't find anything  . Osteopenia   . Prostate cancer metastatic to pelvis [facility]) urologist-- dr Anel Purohit/  oncologist -- dr Alen Blew   dx 2014 advanced prostate cancer with pelvid adenopathy,  Stage T2c, Gleason 7;  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  . Wears glasses     Past Surgical History:  Procedure Laterality Date  . COLONOSCOPY    . IR KYPHO EA ADDL LEVEL THORACIC OR LUMBAR  08/17/2018  . IR KYPHO EA ADDL LEVEL THORACIC OR LUMBAR  08/26/2018  . IR KYPHO LUMBAR INC FX REDUCE BONE BX UNI/BIL CANNULATION INC/IMAGING  08/26/2018  . IR KYPHO THORACIC WITH BONE BIOPSY  08/17/2018  . IR RADIOLOGIST EVAL & MGMT  08/11/2018  . IR RADIOLOGIST EVAL & MGMT  08/24/2018  . LYMPHADENECTOMY Left 01/05/2013   Procedure: ROBOTIC LYMPHADENECTOMY;  Surgeon: Dutch Gray, MD;  Location: [facility];  Service: Urology;  Laterality: Left;  . PROSTATE BIOPSY     11/12 positive biopsies  . ROTATOR CUFF REPAIR Left 2012  . UMBILICAL HERNIA REPAIR  09-02-2001   dr t. price '@[facility]'$     Current Hospital Medications:  Home Meds:  No current facility-administered medications on file prior to encounter.    Current Outpatient Medications on File Prior to Encounter  Medication Sig Dispense Refill  . abiraterone acetate (ZYTIGA) 250 MG tablet Take 1,000 mg by mouth daily.   10  .  albuterol (PROAIR HFA) 108 (90 Base) MCG/ACT inhaler Inhale 2 puffs into the lungs every 6 (six) hours as needed for wheezing or shortness of breath. 8.5 g 3  . albuterol (PROVENTIL) (2.5 MG/3ML) 0.083% nebulizer solution Take 3 mLs (2.5 mg total) by nebulization every 6 (six) hours as needed for wheezing or shortness of breath. 150 mL 1  . azelastine (ASTELIN) 0.1 % nasal spray Place 2 sprays into both nostrils every morning. (Patient taking differently: Place 2 sprays into both nostrils daily as needed for allergies. )    . CALCIUM-VITAMIN D PO Take 1 tablet by mouth 2 (two) times  daily.     . carvedilol (COREG) 3.125 MG tablet Take 1 tablet (3.125 mg total) by mouth 2 (two) times daily with a meal. 180 tablet 0  . cyanocobalamin (,VITAMIN B-12,) 1000 MCG/ML injection Inject 1,000 mcg into the muscle every 30 (thirty) days.   4  . cyclobenzaprine (FLEXERIL) 5 MG tablet Take 5 mg by mouth 3 (three) times daily as needed for muscle spasms.     [name] docusate sodium (COLACE) 100 MG capsule Take 100 mg by mouth 2 (two) times daily.     [name] ipratropium-albuterol (DUONEB) 0.5-2.5 (3) MG/3ML SOLN Take 3 mLs by nebulization every 4 (four) hours as needed. 360 mL 1  . Leuprolide Acetate (LUPRON IJ) Inject 1 Dose as directed every 4 (four) months. This is administered by [facility]    . magnesium citrate SOLN Take 1 Bottle by mouth as needed for severe constipation.    . methocarbamol (ROBAXIN) 500 MG tablet Take 500 mg by mouth 2 (two) times daily.     . mometasone (NASONEX) 50 MCG/ACT nasal spray USE 1 TO 2 SPRAYS IN EACH NOSTRIL ONCE DAILY. (Patient taking differently: Place 2 sprays into the nose daily as needed (allergies). ) 17 g 5  . morphine (MS CONTIN) 30 MG 12 hr tablet Take 1 tablet (30 mg total) by mouth every 12 (twelve) hours. 60 tablet 0  . NARCAN 4 MG/0.1ML LIQD nasal spray kit Place 0.4 mg into the nose once.     [name] olmesartan (BENICAR) 40 MG tablet TAKE 1 TABLET DAILY 90 tablet 0  . oxyCODONE (OXY IR/ROXICODONE) 5 MG immediate release tablet Take 1 tablet (5 mg total) by mouth every 4 (four) hours as needed for severe pain (may take 1-2 tablets every 4 hours). (Patient taking differently: Take 5-10 mg by mouth every 4 (four) hours as needed for severe pain (may take 1-2 tablets every 4 hours). ) 120 tablet 0  . Polyethylene Glycol 3350 (MIRALAX PO) Take 1 Dose by mouth daily as needed (constipation).     . predniSONE (DELTASONE) 5 MG tablet Take 8 tablets (40 mg total) by mouth daily. Taper as directed (Patient taking differently: Take 40 mg by mouth 2 (two) times daily.  Takes while on Zytiga) 60 tablet 0  . SYMBICORT 160-4.5 MCG/ACT inhaler Inhale 2 puffs into the lungs 2 (two) times daily. (Patient taking differently: Inhale 2 puffs into the lungs 2 (two) times daily. ) 30.6 g 1  . SYRINGE-NEEDLE, DISP, 3 ML 3 ML MISC Use to inject b12 monthly. 12 each 0     Scheduled Meds: . carvedilol  3.125 mg Oral BID WC  . docusate sodium  100 mg Oral BID  . enoxaparin (LOVENOX) injection  40 mg Subcutaneous Q24H  . ipratropium-albuterol  3 mL Nebulization TID  . irbesartan  300 mg Oral Daily  . methocarbamol  500  mg Oral BID  . mometasone-formoterol  2 puff Inhalation BID  . oxyCODONE  15 mg Oral Q12H  . predniSONE  5 mg Oral BID WC   Continuous Infusions: . sodium chloride 10 mL/hr at 09/13/18 1020  . cefTRIAXone (ROCEPHIN)  IV 1 g (09/13/18 2208)   PRN Meds:.sodium chloride, acetaminophen **OR** acetaminophen, albuterol, haloperidol lactate, magnesium citrate, ondansetron **OR** ondansetron (ZOFRAN) IV, oxyCODONE  Allergies:  Allergies  Allergen Reactions  . Amlodipine Swelling    edema  . Tetracycline Rash    Family History  Problem Relation Age of Onset  . Arthritis Mother   . Diabetes Mother   . Hypertension Mother   . Hyperlipidemia Mother   . Hypertension Father   . Prostate cancer Father        radiation in mid 72s  . Heart attack Father   . Cancer Sister        precancerous colon polyps, 6 in colon removed  . Stroke Maternal Grandfather     Social History:  reports that he quit smoking about 6 years ago. His smoking use included cigarettes. He has a 12.50 pack-year smoking history. He has never used smokeless tobacco. He reports previous alcohol use of about 2.0 standard drinks of alcohol per week. He reports that he does not use drugs.  ROS: A complete review of systems was performed.  All systems are negative except for pertinent findings as noted.  Physical Exam:  Vital signs in last 24 hours: Temp:  [97.3 F (36.3 C)-98.8 F  (37.1 C)] 97.7 F (36.5 C) (02/19 0551) Pulse Rate:  [87-98] 88 (02/19 0551) Resp:  [16-18] 16 (02/19 0551) BP: (122-167)/(85-96) 167/96 (02/19 0551) SpO2:  [95 %-99 %] 97 % (02/19 0737) Constitutional:  Alert and oriented this morning but somnolent, No acute distress Cardiovascular: Regular rate and rhythm, No JVD Respiratory: Normal respiratory effort, Lungs clear bilaterally GI: Abdomen is soft, nontender, nondistended, no abdominal masses GU: No CVA tenderness Lymphatic: No lymphadenopathy Neurologic: Grossly intact, no focal deficits Psychiatric: Normal mood  Laboratory Data:  Recent Labs    09/12/18 2255 09/14/18 0546  WBC 8.7 6.7  HGB 13.1 11.4*  HCT 38.5* 35.0*  PLT 264 202    Recent Labs    09/12/18 2255 09/13/18 0554 09/14/18 0546  NA 135  --  138  K 2.7* 3.1* 3.9  CL 103  --  111  GLUCOSE 105*  --  89  BUN 7*  --  10  CALCIUM 9.4  --  8.5*  CREATININE 0.99  --  0.73     Results for orders placed or performed during the hospital encounter of 09/12/18 (from the past 24 hour(s))  CBC     Status: Abnormal   Collection Time: 09/14/18  5:46 AM  Result Value Ref Range   WBC 6.7 4.0 - 10.5 K/uL   RBC 3.55 (L) 4.22 - 5.81 MIL/uL   Hemoglobin 11.4 (L) 13.0 - 17.0 g/dL   HCT 35.0 (L) 39.0 - 52.0 %   MCV 98.6 80.0 - 100.0 fL   MCH 32.1 26.0 - 34.0 pg   MCHC 32.6 30.0 - 36.0 g/dL   RDW 11.9 11.5 - 15.5 %   Platelets 202 150 - 400 K/uL   nRBC 0.0 0.0 - 0.2 %  Comprehensive metabolic panel     Status: Abnormal   Collection Time: 09/14/18  5:46 AM  Result Value Ref Range   Sodium 138 135 - 145 mmol/L   Potassium 3.9 3.5 -  5.1 mmol/L   Chloride 111 98 - 111 mmol/L   CO2 19 (L) 22 - 32 mmol/L   Glucose, Bld 89 70 - 99 mg/dL   BUN 10 8 - 23 mg/dL   Creatinine, Ser 0.73 0.61 - 1.24 mg/dL   Calcium 8.5 (L) 8.9 - 10.3 mg/dL   Total Protein 5.4 (L) 6.5 - 8.1 g/dL   Albumin 3.3 (L) 3.5 - 5.0 g/dL   AST 29 15 - 41 U/L   ALT 54 (H) 0 - 44 U/L   Alkaline  Phosphatase 119 38 - 126 U/L   Total Bilirubin 1.1 0.3 - 1.2 mg/dL   GFR calc non Af Amer >60 >60 mL/min   GFR calc Af Amer >60 >60 mL/min   Anion gap 8 5 - 15  Magnesium     Status: None   Collection Time: 09/14/18  5:46 AM  Result Value Ref Range   Magnesium 2.4 1.7 - 2.4 mg/dL   Recent Results (from the past 240 hour(s))  Urine culture     Status: Abnormal (Preliminary result)   Collection Time: 09/12/18 10:55 PM  Result Value Ref Range Status   Specimen Description   Final    URINE, CLEAN CATCH Performed at [facility], [place] [address] [place], [place] [address]    Special Requests   Final    NONE Performed at [facility], [place] [address] [place], [place] [address]    Culture (A)  Final    >=100,000 COLONIES/mL UNIDENTIFIED ORGANISM Performed at [facility], [place] [address] [place], [place] [address]    Report Status PENDING  Incomplete    Renal Function: Recent Labs    09/08/18 1140 09/12/18 2255 09/14/18 0546  CREATININE 0.81 0.99 0.73   Estimated Creatinine Clearance: 87.2 mL/min (by C-G formula based on SCr of 0.73 mg/dL).  Radiologic Imaging: Ct Head W Or Wo Contrast  Result Date: 09/13/2018 CLINICAL DATA:  Initial evaluation for hallucinations. History of metastatic prostate cancer. EXAM: CT HEAD WITHOUT AND WITH CONTRAST TECHNIQUE: Contiguous axial images were obtained from the base of the skull through the vertex without and with intravenous contrast CONTRAST:  139m ISOVUE-370 IOPAMIDOL (ISOVUE-370) INJECTION 76% COMPARISON:  Prior MRI from 04/11/2018 FINDINGS: Brain: Examination limited by motion artifact. Generalized age-related cerebral atrophy. No acute intracranial hemorrhage. No acute large vessel territory infarct. No mass lesion, midline shift or mass effect. No hydrocephalus. No extra-axial fluid collection. No appreciable abnormal enhancement. Vascular: No hyperdense vessel. Scattered vascular  calcifications noted within the carotid siphons. Normal intravascular enhancement seen throughout the intracranial vasculature following contrast administration. Skull: Scalp soft tissues demonstrate no acute finding. Calvarium intact. No definite focal osseous lesions on this limited motion degraded exam. Corticated lucency extending through the anterior arch of C1, chronic in appearance, likely congenital. Sinuses/Orbits: Globes and orbital soft tissues within normal limits. Paranasal sinuses and mastoid air cells are grossly clear. Other: None. IMPRESSION: 1. Technically limited exam due to extensive motion artifact. 2. Grossly negative head CT with and without contrast. No acute intracranial abnormality. No mass lesion or abnormal enhancement. Electronically Signed   By: BJeannine BogaM.D.   On: 09/13/2018 02:17   Ct Angio Chest Pe W And/or Wo Contrast  Result Date: 09/13/2018 CLINICAL DATA:  63year old male with shortness of breath, possible small bowel obstruction. Metastatic prostate cancer. EXAM: CT ANGIOGRAPHY CHEST CT ABDOMEN AND PELVIS WITH CONTRAST TECHNIQUE: Multidetector CT imaging of the chest was performed using the standard  protocol during bolus administration of intravenous contrast. Multiplanar CT image reconstructions and MIPs were obtained to evaluate the vascular anatomy. Multidetector CT imaging of the abdomen and pelvis was performed using the standard protocol during bolus administration of intravenous contrast. CONTRAST:  195m ISOVUE-370 IOPAMIDOL (ISOVUE-370) INJECTION 76% COMPARISON:  Head CT today reported separately. Chest CTA 08/10/2018. CT Abdomen and Pelvis 07/28/2018. FINDINGS: CTA CHEST FINDINGS Cardiovascular: Good contrast bolus timing in the pulmonary arterial tree. Respiratory motion. No central pulmonary embolus. The upper lobe segmental branches also appear patent. The lower lobe branches are poorly evaluated due to motion. No cardiomegaly or pericardial  effusion. Stable mild tortuosity of the thoracic aorta. Mediastinum/Nodes: No lymphadenopathy. Lungs/Pleura: Major airways are patent. Chronic atelectasis or scarring in the left lower lung. No pleural effusion or acute pulmonary opacity. Musculoskeletal: Bone detail substantially degraded by motion at some levels. The T10 compression fracture has been augmented. There are new T8 and T9 moderate compression fractures (series 27, image 77). There is a new nonacute appearing anterior left lower rib fracture with callus on series 25, image 114. Review of the MIP images confirms the above findings. CT ABDOMEN and PELVIS FINDINGS Intermittent motion artifact. Hepatobiliary: Stable and negative. Pancreas: Negative. Spleen: Negative. Adrenals/Urinary Tract: Adrenal glands and kidneys appear stable. Renal enhancement and contrast excretion appears symmetric and normal. There are small bilateral benign appearing renal cysts. Urinary bladder detail today is degraded by motion. Stomach/Bowel: Increased retained stool in the large bowel. Redundant sigmoid colon. No large bowel inflammation. Gas and fluid-filled small bowel loops measure at the upper limits of normal, 28 millimeters. Some detail is degraded by motion but there is no small bowel transition identified. The stomach is decompressed.  No free air, free fluid. Vascular/Lymphatic: Aortoiliac calcified atherosclerosis. Major arterial structures appear to remain patent. Grossly patent portal venous system. No lymphadenopathy. Reproductive: Negative. Other: No pelvic free fluid. Musculoskeletal: Bone detail intermittently degraded by motion. Augmented T12, L1, and L2 compression fractures. Mild L3 superior endplate compression since 07/28/2018. Review of the MIP images confirms the above findings. IMPRESSION: 1. Motion degraded CTA of the chest and CT Abdomen and Pelvis. 2. No central pulmonary embolus, but pulmonary artery branch detail is degraded by motion. 3. New  compression fractures of T8, T9, and L3 since January which may be pathologic fractures in the setting of prostate cancer. Interval augmentation of the T10, T12, L1 and L2 fractures. 4. No other new abnormality identified in the chest, abdomen, or pelvis. Electronically Signed   By: HGenevie AnnM.D.   On: 09/13/2018 02:36   Ct Abdomen Pelvis W Contrast  Result Date: 09/13/2018 CLINICAL DATA:  63year old male with shortness of breath, possible small bowel obstruction. Metastatic prostate cancer. EXAM: CT ANGIOGRAPHY CHEST CT ABDOMEN AND PELVIS WITH CONTRAST TECHNIQUE: Multidetector CT imaging of the chest was performed using the standard protocol during bolus administration of intravenous contrast. Multiplanar CT image reconstructions and MIPs were obtained to evaluate the vascular anatomy. Multidetector CT imaging of the abdomen and pelvis was performed using the standard protocol during bolus administration of intravenous contrast. CONTRAST:  1027mISOVUE-370 IOPAMIDOL (ISOVUE-370) INJECTION 76% COMPARISON:  Head CT today reported separately. Chest CTA 08/10/2018. CT Abdomen and Pelvis 07/28/2018. FINDINGS: CTA CHEST FINDINGS Cardiovascular: Good contrast bolus timing in the pulmonary arterial tree. Respiratory motion. No central pulmonary embolus. The upper lobe segmental branches also appear patent. The lower lobe branches are poorly evaluated due to motion. No cardiomegaly or pericardial effusion. Stable mild tortuosity of the thoracic  aorta. Mediastinum/Nodes: No lymphadenopathy. Lungs/Pleura: Major airways are patent. Chronic atelectasis or scarring in the left lower lung. No pleural effusion or acute pulmonary opacity. Musculoskeletal: Bone detail substantially degraded by motion at some levels. The T10 compression fracture has been augmented. There are new T8 and T9 moderate compression fractures (series 27, image 77). There is a new nonacute appearing anterior left lower rib fracture with callus on series  25, image 114. Review of the MIP images confirms the above findings. CT ABDOMEN and PELVIS FINDINGS Intermittent motion artifact. Hepatobiliary: Stable and negative. Pancreas: Negative. Spleen: Negative. Adrenals/Urinary Tract: Adrenal glands and kidneys appear stable. Renal enhancement and contrast excretion appears symmetric and normal. There are small bilateral benign appearing renal cysts. Urinary bladder detail today is degraded by motion. Stomach/Bowel: Increased retained stool in the large bowel. Redundant sigmoid colon. No large bowel inflammation. Gas and fluid-filled small bowel loops measure at the upper limits of normal, 28 millimeters. Some detail is degraded by motion but there is no small bowel transition identified. The stomach is decompressed.  No free air, free fluid. Vascular/Lymphatic: Aortoiliac calcified atherosclerosis. Major arterial structures appear to remain patent. Grossly patent portal venous system. No lymphadenopathy. Reproductive: Negative. Other: No pelvic free fluid. Musculoskeletal: Bone detail intermittently degraded by motion. Augmented T12, L1, and L2 compression fractures. Mild L3 superior endplate compression since 07/28/2018. Review of the MIP images confirms the above findings. IMPRESSION: 1. Motion degraded CTA of the chest and CT Abdomen and Pelvis. 2. No central pulmonary embolus, but pulmonary artery branch detail is degraded by motion. 3. New compression fractures of T8, T9, and L3 since January which may be pathologic fractures in the setting of prostate cancer. Interval augmentation of the T10, T12, L1 and L2 fractures. 4. No other new abnormality identified in the chest, abdomen, or pelvis. Electronically Signed   By: Genevie Ann M.D.   On: 09/13/2018 02:36   Dg Abdomen Acute W/chest  Result Date: 09/12/2018 CLINICAL DATA:  Right-sided rib pain EXAM: DG ABDOMEN ACUTE W/ 1V CHEST COMPARISON:  08/28/2018 FINDINGS: Single-view chest demonstrates platelike atelectasis in  the left mid lung with streaky atelectasis at the left base. Stable cardiomediastinal silhouette. Supine and decubitus views of the abdomen demonstrate no definite free air. Mildly dilated loops of central small bowel measuring up to 3 cm with scattered distal bowel gas noted. Scoliosis of the spine with multiple treated compression fractures. IMPRESSION: 1. Low lung volumes with streaky atelectasis at the left mid to lower lung. 2. Mild air distention of central small bowel with distal gas present, findings could be secondary to ileus or partial bowel obstruction. Electronically Signed   By: Donavan Foil M.D.   On: 09/12/2018 23:44    I independently reviewed the above imaging studies.   Urine culture with growth - > 100,000 unidentified organsim  Impression/Recommendation 1. Bladder tumor: Will start empiric antibiotics in preparation for cystoscopy and TURBT tomorrow.  Currently, I think he can proceed as planned although will re-evaluate this afternoon for definitive treatment plan.  Will plan for NPO after MN.  2. CRPC: Do not suspect new lesions are related to prostate cancer considering his response to current treatment based on PSA, radiologic appearance of lesions, and recent negative bone biopsies.  Continue current therapy with ADT and abiraterone.  Pt has seen Dr. Alen Blew who is now following his prostate cancer and has follow up scheduled next month.  Dutch Gray 09/14/2018, 8:16 AM    Pryor Curia MD  CC: Dr. [name]

## 2018-09-14 NOTE — Progress Notes (Signed)
Pharmacy Antibiotic Note  [name] Holder is a 63 y.o. male admitted on 09/12/2018 with enterococcus in urine; scheduled for TURBT tomorrow.  Pharmacy has been consulted for vancomycin dosing for surgical prophylaxis. Patient has rec'd several doses of Rocephin and one dose of Amoxicillin up to now.  Plan:  Vancomycin 1000 mg q12 x 3 doses  F/u appropriate LOT with Urology postop  Height: 5\' 7"  (170.2 cm) Weight: 172 lb 14.9 oz (78.4 kg) IBW/kg (Calculated) : 66.1  Temp (24hrs), Avg:97.9 F (36.6 C), Min:97.3 F (36.3 C), Max:98.4 F (36.9 C)  Recent Labs  Lab 09/08/18 1140 09/12/18 2255 09/13/18 0554 09/14/18 0546  WBC 11.4* 8.7  --  6.7  CREATININE 0.81 0.99  --  0.73  LATICACIDVEN  --   --  1.1  --     Estimated Creatinine Clearance: 87.2 mL/min (by C-G formula based on SCr of 0.73 mg/dL).    Allergies  Allergen Reactions  . Amlodipine Swelling    edema  . Tetracycline Rash     Thank you for allowing pharmacy to be a part of this patient's care.  Reuel Boom, PharmD, BCPS [phone] 09/14/2018, 5:43 PM

## 2018-09-14 NOTE — Progress Notes (Signed)
I have assumed care over this pt at this time. I agree with the precious nurse's assessment. Pt is comfortable in bed with no questions or complaints at this time. Will continue to monitor.

## 2018-09-14 NOTE — Plan of Care (Signed)
  Problem: Education: Goal: Knowledge of General Education information will improve Description Including pain rating scale, medication(s)/side effects and non-pharmacologic comfort measures Outcome: Progressing   Problem: Health Behavior/Discharge Planning: Goal: Ability to manage health-related needs will improve Outcome: Progressing Note:  Home w/c ordered. CM working on this.    Problem: Clinical Measurements: Goal: Ability to maintain clinical measurements within normal limits will improve Outcome: Progressing Goal: Will remain free from infection Outcome: Progressing Note:  IV rocephin.  Goal: Diagnostic test results will improve Outcome: Progressing Goal: Respiratory complications will improve Outcome: Progressing Goal: Cardiovascular complication will be avoided Outcome: Progressing   Problem: Activity: Goal: Risk for activity intolerance will decrease Outcome: Progressing Note:  PT /OT eval pending   Problem: Coping: Goal: Level of anxiety will decrease Outcome: Progressing   Problem: Elimination: Goal: Will not experience complications related to bowel motility Outcome: Progressing Goal: Will not experience complications related to urinary retention Outcome: Progressing   Problem: Pain Managment: Goal: General experience of comfort will improve Outcome: Progressing   Problem: Safety: Goal: Ability to remain free from injury will improve Outcome: Progressing   Problem: Skin Integrity: Goal: Risk for impaired skin integrity will decrease Outcome: Progressing

## 2018-09-14 NOTE — Progress Notes (Signed)
Patient continuing to be confused with intermittent agitation throughout shift. Patient forgetting when he received medication and claiming he did not get it. Patient exhibiting unorganized thinking and delusions, saying that someone slept in the bed with him last night though he had no visitors.

## 2018-09-14 NOTE — Progress Notes (Signed)
    Durable Medical Equipment  (From admission, onward)         Start     Ordered   09/13/18 1520  For home use only DME lightweight manual wheelchair with seat cushion  Once    Comments:  Patient suffers from Pathological fractures of the spine which impairs their ability to perform daily activities like ADL'S  in the home.  A WALKING AID will not resolve  issue with performing activities of daily living. A wheelchair will allow patient to safely perform daily activities. Patient is not able to propel themselves in the home using a standard weight wheelchair due to [place]. Patient can self propel in the lightweight wheelchair.  Accessories: elevating leg rests (ELRs), wheel locks, extensions and anti-tippers.   09/13/18 1523

## 2018-09-14 NOTE — Progress Notes (Signed)
   09/14/18 1556  Clinical Encounter Type  Visited With Patient  Visit Type Initial  Referral From Palliative care team  Consult/Referral To Chaplain  The chaplain checked the Pt. chart and phoned the RN-Gretchen for an update before the spiritual care visit.  The chaplain introduced herself to the PMT Pt. and was and welcomed into the room. The Pt. shared with the chaplain he is at peace waiting for the update on his bladder surgery.  The chaplain heard the Pt. personal goals are to continue his 73 year marriage and participate in rehab to become stronger.  The Pt. is open to F/U visit from spiritual care.

## 2018-09-14 NOTE — Consult Note (Signed)
Consultation Note Date: 09/14/2018   Patient Name: [name] Holder  DOB: 1955-11-10  MRN: [id number]  Age / Sex: 63 y.o., male  PCP: [name], MD Referring Physician: Samuella Cota, MD  Reason for Consultation: Pain control  HPI/Patient Profile: 63 y.o. male   admitted on 09/12/2018 with  .   Clinical Assessment and Goals of Care:  [name] Holder is a 63 y.o.   with castrate resistant metastatic prostate cancer treated with androgen deprivation and abiraterone/prednisone.  He recently was noted to have compression fractures of the thoracic and lumbar spine s/p kyphoplasty about 2 weeks ago.  Although suspicion for prostate cancer pathologic lesions was low due to his PSA response to treatment, biopsies were performed of his vertebrae at the time of kyphoplasty and were negative.  He also has a known small bladder tumor and is scheduled for TURBT  Tentatively scheduled for 09-15-2018.    He presented to the hospital   due to increasing back pain and imaging has suggested new compression fractures of the thoracic T8, T9 and lumbar L3 spine.  He has been treated with opioid pain medication and has been admitted due to confusion/hallucinations and for pain control.  IR saw him   and has suggested proceeding with repeat kyphoplasty at the new sites once he has stabilized, possibly as an outpatient.  Pain is localized to his lumbar back, right worse than left and radiates to his RLQ abdomen, it is made worse by laying flat in bed.   A palliative consult has been requested for pain control.   The patient is resting on one side, wife is at the bedside. I introduced myself and palliative care as follows: Palliative medicine is specialized medical care for people living with serious illness. It focuses on providing relief from the symptoms and stress of a serious illness. The goal is to improve quality of life  for both the patient and the family.  The patient is resting in bed, he states that he still has peaks/troughs of pain. At times, the pain is a full 10/10. How ever, in between these cycles, he is able to rest. He also has constipation, no nausea. He is hopeful to continue with further treatments for his cancer.   We discussed about his pain regimen in detail. Discussed about long acting and short acting PO opioids. In addition, also discussed about role of IV breakthrough opioids for pain. Also addressed bowel regimen.   I offered active listening, presence and supportive care. At times, the patient does have a flat affect but does elaborate and engage with some gentle encouragements. His wife was present for the entire encounter.   See below.    NEXT OF KIN  wife  SUMMARY OF RECOMMENDATIONS   Pain management:  Continue scheduled PO OxyContin 15 mg PO Q 12  hours.   Change PO Oxy IR to 5 mg PO Q 3 hours prn.   Add IV Dilaudid PRN for severe pain, uncontrolled with  PO regimen.  Add Lidoderm patch and heating pad.  Constipation: Augment bowel regimen and monitor.   Thank you for the consult. PMT to continue to monitor pain and non pain symptom management needs.   Code Status/Advance Care Planning:  Full code    Symptom Management:    as above   Palliative Prophylaxis:   Delirium Protocol  Additional Recommendations (Limitations, Scope, Preferences):  Full Scope Treatment  Psycho-social/Spiritual:   Desire for further Chaplaincy support:yes  Additional Recommendations: Caregiving  Support/Resources  Prognosis:   Unable to determine  Discharge Planning: To Be Determined      Primary Diagnoses: Present on Admission: . Essential hypertension . Secondary malignant neoplasm of bone and bone marrow ([place]) . Pathologic compression fracture of spine, initial encounter ([place]) . Delirium . Acute lower UTI . Cancer associated pain . Bladder cancer ([place]) . Acute  encephalopathy   I have reviewed the medical record, interviewed the patient and family, and examined the patient. The following aspects are pertinent.  Past Medical History:  Diagnosis Date  . Allergic rhinitis   . At risk for sleep apnea    STOP-BANG SCORE = 5    (routed to pt's pcp 09-08-2018)  . Chronic constipation   . Chronic low back pain   . Chronic pain   . DOE (dyspnea on exertion)   . History of kidney stones   . History of recent pneumonia 08/10/2018   acute bronchopneumonia;  follow-up cxr 08-28-2018 in epic  . History of vertebral compression fracture    T10 and T12 s/p kyphoplasty  . Hx of colonic polyps   . Hyperplasia of prostate with lower urinary tract symptoms (LUTS)   . Hypertension   . Mild persistent asthma    followed by pcp  . Numbness in both legs    secondary to a fall, per pt has had work-up and couldn't find anything  . Osteopenia   . Prostate cancer metastatic to pelvis [facility]) urologist-- dr borden/  oncologist -- dr Alen Blew   dx 2014 advanced prostate cancer with pelvid adenopathy,  Stage T2c, Gleason 7;  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  . Wears glasses    Social History   Socioeconomic History  . Marital status: Married    Spouse name: Not on file  . Number of children: Not on file  . Years of education: 8  . Highest education level: Not on file  Occupational History  . 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 Financial resource strain: Not on file  . Food insecurity:    Worry: Not on file    Inability: Not on file  . Transportation needs:    Medical: Not on file    Non-medical: Not on file  Tobacco Use  . Smoking status: Former Smoker    Packs/day: 0.50    Years: 25.00    Pack years: 12.50    Types: Cigarettes    Last attempt to quit: 09/08/2012    Years since quitting: 6.0  . Smokeless tobacco: Never Used  Substance and Sexual Activity  . Alcohol use: Not Currently     Alcohol/week: 2.0 standard drinks    Types: 2 Glasses of wine per week    Comment: wine  1  glasses daily  . Drug use: Never  . Sexual activity: Not on file  Lifestyle  . Physical activity:    Days per week: Not on file    Minutes per session: Not  on file  . Stress: Not on file  Relationships  . Social connections:    Talks on phone: Not on file    Gets together: Not on file    Attends religious service: Not on file    Active member of club or organization: Not on file    Attends meetings of clubs or organizations: Not on file    Relationship status: Not on file  Other Topics Concern  . Not on file  Social History Narrative   Regular exercise-yes   Caffeine Use-yes   Family History  Problem Relation Age of Onset  . Arthritis Mother   . Diabetes Mother   . Hypertension Mother   . Hyperlipidemia Mother   . Hypertension Father   . Prostate cancer Father        radiation in mid 25s  . Heart attack Father   . Cancer Sister        precancerous colon polyps, 6 in colon removed  . Stroke Maternal Grandfather    Scheduled Meds: . amoxicillin  500 mg Oral Q8H  . carvedilol  3.125 mg Oral BID WC  . enoxaparin (LOVENOX) injection  40 mg Subcutaneous Q24H  . ipratropium-albuterol  3 mL Nebulization TID  . irbesartan  300 mg Oral Daily  . lidocaine  1 patch Transdermal Q24H  . methocarbamol  500 mg Oral BID  . mometasone-formoterol  2 puff Inhalation BID  . oxyCODONE  15 mg Oral Q12H  . predniSONE  5 mg Oral BID WC  . senna-docusate  1 tablet Oral BID   Continuous Infusions: . sodium chloride 10 mL/hr at 09/13/18 1020   PRN Meds:.sodium chloride, acetaminophen **OR** acetaminophen, albuterol, bisacodyl, haloperidol lactate, HYDROmorphone (DILAUDID) injection, magnesium citrate, ondansetron **OR** ondansetron (ZOFRAN) IV, oxyCODONE Medications Prior to Admission:  Prior to Admission medications   Medication Sig Start Date End Date Taking? Authorizing Provider  abiraterone  acetate (ZYTIGA) 250 MG tablet Take 1,000 mg by mouth daily.  08/17/17  Yes [provider]  albuterol (PROAIR HFA) 108 (90 Base) MCG/ACT inhaler Inhale 2 puffs into the lungs every 6 (six) hours as needed for wheezing or shortness of breath. 08/10/18  Yes Caccavale, Sophia, PA-C  albuterol (PROVENTIL) (2.5 MG/3ML) 0.083% nebulizer solution Take 3 mLs (2.5 mg total) by nebulization every 6 (six) hours as needed for wheezing or shortness of breath. 08/15/18  Yes Marrian Salvage, FNP  azelastine (ASTELIN) 0.1 % nasal spray Place 2 sprays into both nostrils every morning. Patient taking differently: Place 2 sprays into both nostrils daily as needed for allergies.  04/12/18  Yes Johnson, Clanford L, MD  CALCIUM-VITAMIN D PO Take 1 tablet by mouth 2 (two) times daily.    Yes [provider]  carvedilol (COREG) 3.125 MG tablet Take 1 tablet (3.125 mg total) by mouth 2 (two) times daily with a meal. 09/05/18  Yes [name], MD  cyanocobalamin (,VITAMIN B-12,) 1000 MCG/ML injection Inject 1,000 mcg into the muscle every 30 (thirty) days.  05/14/18  Yes [provider]  cyclobenzaprine (FLEXERIL) 5 MG tablet Take 5 mg by mouth 3 (three) times daily as needed for muscle spasms.  05/16/18  Yes [provider]  docusate sodium (COLACE) 100 MG capsule Take 100 mg by mouth 2 (two) times daily.    Yes [provider]  ipratropium-albuterol (DUONEB) 0.5-2.5 (3) MG/3ML SOLN Take 3 mLs by nebulization every 4 (four) hours as needed. 08/19/18  Yes Marrian Salvage, FNP  Leuprolide Acetate (  LUPRON IJ) Inject 1 Dose as directed every 4 (four) months. This is administered by [facility]   Yes [provider]  magnesium citrate SOLN Take 1 Bottle by mouth as needed for severe constipation.   Yes [provider]  methocarbamol (ROBAXIN) 500 MG tablet Take 500 mg by mouth 2 (two) times daily.    Yes [provider]  mometasone (NASONEX) 50  MCG/ACT nasal spray USE 1 TO 2 SPRAYS IN EACH NOSTRIL ONCE DAILY. Patient taking differently: Place 2 sprays into the nose daily as needed (allergies).  09/16/17  Yes [name], MD  morphine (MS CONTIN) 30 MG 12 hr tablet Take 1 tablet (30 mg total) by mouth every 12 (twelve) hours. 09/05/18  Yes Wyatt Portela, MD  NARCAN 4 MG/0.1ML LIQD nasal spray kit Place 0.4 mg into the nose once.  08/18/18  Yes [provider]  olmesartan (BENICAR) 40 MG tablet TAKE 1 TABLET DAILY 08/08/18  Yes [name], MD  oxyCODONE (OXY IR/ROXICODONE) 5 MG immediate release tablet Take 1 tablet (5 mg total) by mouth every 4 (four) hours as needed for severe pain (may take 1-2 tablets every 4 hours). Patient taking differently: Take 5-10 mg by mouth every 4 (four) hours as needed for severe pain (may take 1-2 tablets every 4 hours).  09/05/18  Yes Wyatt Portela, MD  Polyethylene Glycol 3350 (MIRALAX PO) Take 1 Dose by mouth daily as needed (constipation).    Yes [provider]  predniSONE (DELTASONE) 5 MG tablet Take 8 tablets (40 mg total) by mouth daily. Taper as directed Patient taking differently: Take 40 mg by mouth 2 (two) times daily. Takes while on Zytiga 08/28/18  Yes Hayden Rasmussen, MD  SYMBICORT 160-4.5 MCG/ACT inhaler Inhale 2 puffs into the lungs 2 (two) times daily. Patient taking differently: Inhale 2 puffs into the lungs 2 (two) times daily.  08/19/18  Yes [name], MD  SYRINGE-NEEDLE, DISP, 3 ML 3 ML MISC Use to inject b12 monthly. 04/19/18   [name], MD   Allergies  Allergen Reactions  . Amlodipine Swelling    edema  . Tetracycline Rash   Review of Systems +back pain +lower abd pain, worse on R side.   Physical Exam Awake alert Laying on one side of bed, in order to avoid back pain when he lays flat in bed Regular S1 S 2 Abdomen is soft, mild tenderness in RLQ No edema Patient appears thin and with some generalized weakness Non focal Some what  flat in affect  Vital Signs: BP (!) 162/92 (BP Location: Right Arm)   Pulse 91   Temp 98.4 F (36.9 C) (Oral)   Resp 18   Ht 5' 7" (1.702 m)   Wt 78.4 kg   SpO2 98%   BMI 27.08 kg/m  Pain Scale: 0-10 POSS *See Group Information*: S-Acceptable,Sleep, easy to arouse Pain Score: 3    SpO2: SpO2: 98 % O2 Device:SpO2: 98 % O2 Flow Rate: .   IO: Intake/output summary:   Intake/Output Summary (Last 24 hours) at 09/14/2018 1319 Last data filed at 09/14/2018 0900 Gross per 24 hour  Intake 300.36 ml  Output 285 ml  Net 15.36 ml    LBM: Last BM Date: 09/13/18 Baseline Weight: Weight: 79.4 kg Most recent weight: Weight: 78.4 kg     Palliative Assessment/Data:   Flowsheet Rows     Most Recent Value  Intake Tab  Referral Department  Hospitalist  Unit  at Time of Referral  Med/Surg Unit  Palliative Care Primary Diagnosis  Cancer  Date Notified  09/13/18  Palliative Care Type  New Palliative care  Reason for referral  Pain, Non-pain Symptom  Date of Admission  09/12/18  # of days IP prior to Palliative referral  1  Clinical Assessment  Psychosocial & Spiritual Assessment  Palliative Care Outcomes    PPS 40%  Time In:  11 Time Out:  12.10 Time Total:  70 min  Greater than 50%  of this time was spent counseling and coordinating care related to the above assessment and plan.  Signed by: Loistine Chance, MD  [phone] Please contact Palliative Medicine Team phone at [phone] for questions and concerns.  For individual provider: See Shea Evans

## 2018-09-14 NOTE — Progress Notes (Addendum)
Patient ID: [name] Holder, male   DOB: 03-17-1956, 63 y.o.   MRN: [id number]   Pt feeling better today.  His urine culture is growing enterococcus.  Although I would typically delay his planned cystoscopic procedure, considering his severe back pain related to his compression fractures in his need for further intervention in the near future, I do not think it would be wise to significantly delayed treatment of his bladder tumor.  As such, I think would be reasonable to start him on appropriate antibiotic therapy for Enterococcus and continue this perioperatively.  I have spoken with Dr. Sarajane Jews  and will plan to start vancomycin this evening.  He will plan to proceed with transurethral resection of his bladder tumor tomorrow as scheduled.  He will be NPO after midnight tonight.  PSA from today was 0.48.  This suggests continued response to current prostate cancer treatment without evidence to suggest progression.

## 2018-09-15 ENCOUNTER — Encounter (HOSPITAL_COMMUNITY): Payer: Self-pay | Admitting: Certified Registered Nurse Anesthetist

## 2018-09-15 ENCOUNTER — Inpatient Hospital Stay (HOSPITAL_COMMUNITY): Payer: BLUE CROSS/BLUE SHIELD | Admitting: Anesthesiology

## 2018-09-15 ENCOUNTER — Inpatient Hospital Stay (HOSPITAL_COMMUNITY): Payer: BLUE CROSS/BLUE SHIELD | Admitting: Physician Assistant

## 2018-09-15 ENCOUNTER — Encounter (HOSPITAL_COMMUNITY)
Admission: EM | Disposition: A | Discharge status: Skilled Nursing Facility | Payer: Self-pay | Source: Home / Self Care | Attending: Family Medicine

## 2018-09-15 ENCOUNTER — Ambulatory Visit (HOSPITAL_COMMUNITY): Admission: RE | Admit: 2018-09-15 | Payer: BLUE CROSS/BLUE SHIELD | Source: Home / Self Care | Admitting: Urology

## 2018-09-15 HISTORY — PX: TRANSURETHRAL RESECTION OF BLADDER TUMOR: SHX2575

## 2018-09-15 LAB — URINE CULTURE: Culture: >=100000 — AB

## 2018-09-15 LAB — CREATININE, SERUM
Creatinine, Ser: 0.61 mg/dL (ref 0.61–1.24)
GFR calc Af Amer: >60 mL/min (ref >60)
GFR calc non Af Amer: >60 mL/min (ref >60)

## 2018-09-15 SURGERY — TURBT (TRANSURETHRAL RESECTION OF BLADDER TUMOR)
Anesthesia: General

## 2018-09-15 MED ORDER — ACETAMINOPHEN 160 MG/5ML PO SOLN: 325.0000 mg | ORAL | Status: DC | PRN

## 2018-09-15 MED ORDER — ONDANSETRON HCL 4 MG/2ML IJ SOLN
INTRAMUSCULAR | Status: AC
  Filled 2018-09-15: qty 2

## 2018-09-15 MED ORDER — ROCURONIUM BROMIDE 100 MG/10ML IV SOLN
INTRAVENOUS | Status: DC | PRN
  Administered 2018-09-15: 10 mg via INTRAVENOUS
  Administered 2018-09-15: 40 mg via INTRAVENOUS

## 2018-09-15 MED ORDER — GEMCITABINE CHEMO FOR BLADDER INSTILLATION 2000 MG: 2000.0000 mg | Freq: Once | INTRAVENOUS | Status: DC

## 2018-09-15 MED ORDER — FENTANYL CITRATE (PF) 100 MCG/2ML IJ SOLN
INTRAMUSCULAR | Status: AC
  Filled 2018-09-15: qty 2

## 2018-09-15 MED ORDER — LIDOCAINE HCL (CARDIAC) PF 100 MG/5ML IV SOSY
PREFILLED_SYRINGE | INTRAVENOUS | Status: DC | PRN
  Administered 2018-09-15: 100 mg via INTRAVENOUS

## 2018-09-15 MED ORDER — CEFAZOLIN SODIUM-DEXTROSE 2-4 GM/100ML-% IV SOLN
2.0000 g | Freq: Once | INTRAVENOUS | Status: AC
  Administered 2018-09-15: 2 g via INTRAVENOUS
  Filled 2018-09-15: qty 100

## 2018-09-15 MED ORDER — PROPOFOL 10 MG/ML IV BOLUS
INTRAVENOUS | Status: DC | PRN
  Administered 2018-09-15: 120 mg via INTRAVENOUS

## 2018-09-15 MED ORDER — AMOXICILLIN 500 MG PO CAPS
500.0000 mg | ORAL_CAPSULE | Freq: Three times a day (TID) | ORAL | Status: AC
  Administered 2018-09-15 – 2018-09-20 (×16): 500 mg via ORAL
  Filled 2018-09-15 (×2): qty 2
  Filled 2018-09-15 (×2): qty 1
  Filled 2018-09-15 (×3): qty 2
  Filled 2018-09-15 (×2): qty 1
  Filled 2018-09-15 (×2): qty 2
  Filled 2018-09-15: qty 1
  Filled 2018-09-15 (×2): qty 2
  Filled 2018-09-15 (×2): qty 1
  Filled 2018-09-15: qty 2
  Filled 2018-09-15 (×2): qty 1
  Filled 2018-09-15 (×2): qty 2
  Filled 2018-09-15 (×5): qty 1
  Filled 2018-09-15: qty 2
  Filled 2018-09-15: qty 1
  Filled 2018-09-15 (×2): qty 2
  Filled 2018-09-15: qty 1

## 2018-09-15 MED ORDER — IPRATROPIUM-ALBUTEROL 0.5-2.5 (3) MG/3ML IN SOLN
3.0000 mL | Freq: Two times a day (BID) | RESPIRATORY_TRACT | Status: DC
  Administered 2018-09-15 – 2018-09-18 (×7): 3 mL via RESPIRATORY_TRACT
  Filled 2018-09-15 (×7): qty 3

## 2018-09-15 MED ORDER — DEXMEDETOMIDINE HCL 200 MCG/2ML IV SOLN
20.0000 ug | Freq: Once | INTRAVENOUS | Status: AC
  Administered 2018-09-15: 20 ug via INTRAVENOUS

## 2018-09-15 MED ORDER — ONDANSETRON HCL 4 MG/2ML IJ SOLN: 4.0000 mg | Freq: Once | INTRAMUSCULAR | Status: DC | PRN

## 2018-09-15 MED ORDER — SUGAMMADEX SODIUM 200 MG/2ML IV SOLN
INTRAVENOUS | Status: DC | PRN
  Administered 2018-09-15: 200 mg via INTRAVENOUS

## 2018-09-15 MED ORDER — SODIUM CHLORIDE 0.9 % IR SOLN
Status: DC | PRN
  Administered 2018-09-15: 3000 mL via INTRAVESICAL

## 2018-09-15 MED ORDER — KETAMINE HCL 10 MG/ML IJ SOLN
INTRAMUSCULAR | Status: DC | PRN
  Administered 2018-09-15: 30 mg via INTRAVENOUS

## 2018-09-15 MED ORDER — PROPOFOL 10 MG/ML IV BOLUS
INTRAVENOUS | Status: AC
  Filled 2018-09-15: qty 20

## 2018-09-15 MED ORDER — ROCURONIUM BROMIDE 100 MG/10ML IV SOLN
INTRAVENOUS | Status: AC
  Filled 2018-09-15: qty 1

## 2018-09-15 MED ORDER — FENTANYL CITRATE (PF) 100 MCG/2ML IJ SOLN
25.0000 ug | INTRAMUSCULAR | Status: DC | PRN
  Administered 2018-09-15 (×2): 50 ug via INTRAVENOUS

## 2018-09-15 MED ORDER — LORAZEPAM 2 MG/ML IJ SOLN
0.5000 mg | Freq: Once | INTRAMUSCULAR | Status: AC | PRN
  Administered 2018-09-15: 0.5 mg via INTRAVENOUS
  Filled 2018-09-15: qty 1

## 2018-09-15 MED ORDER — GEMCITABINE CHEMO FOR BLADDER INSTILLATION 2000 MG
2000.0000 mg | Freq: Once | INTRAVENOUS | Status: AC
  Administered 2018-09-15: 2000 mg via INTRAVESICAL
  Filled 2018-09-15: qty 2000

## 2018-09-15 MED ORDER — OXYCODONE HCL 5 MG/5ML PO SOLN: 5.0000 mg | Freq: Once | ORAL | Status: DC | PRN

## 2018-09-15 MED ORDER — MIDAZOLAM HCL 2 MG/2ML IJ SOLN
INTRAMUSCULAR | Status: AC
  Filled 2018-09-15: qty 2

## 2018-09-15 MED ORDER — MIDAZOLAM HCL 2 MG/2ML IJ SOLN
2.0000 mg | Freq: Once | INTRAMUSCULAR | Status: AC
  Administered 2018-09-15: 2 mg via INTRAVENOUS

## 2018-09-15 MED ORDER — ACETAMINOPHEN 325 MG PO TABS: 325.0000 mg | ORAL_TABLET | ORAL | Status: DC | PRN

## 2018-09-15 MED ORDER — KETAMINE HCL 10 MG/ML IJ SOLN
INTRAMUSCULAR | Status: AC
  Filled 2018-09-15: qty 1

## 2018-09-15 MED ORDER — LACTATED RINGERS IV SOLN
INTRAVENOUS | Status: DC
  Administered 2018-09-15: 13:00:00 via INTRAVENOUS

## 2018-09-15 MED ORDER — FENTANYL CITRATE (PF) 100 MCG/2ML IJ SOLN
INTRAMUSCULAR | Status: DC | PRN
  Administered 2018-09-15: 100 ug via INTRAVENOUS

## 2018-09-15 MED ORDER — MIDAZOLAM HCL 5 MG/5ML IJ SOLN
INTRAMUSCULAR | Status: DC | PRN
  Administered 2018-09-15: 1 mg via INTRAVENOUS

## 2018-09-15 MED ORDER — HYDRALAZINE HCL 20 MG/ML IJ SOLN
INTRAMUSCULAR | Status: AC
  Filled 2018-09-15: qty 1

## 2018-09-15 MED ORDER — OXYCODONE HCL 5 MG PO TABS: 5.0000 mg | ORAL_TABLET | Freq: Once | ORAL | Status: DC | PRN

## 2018-09-15 MED ORDER — DEXAMETHASONE SODIUM PHOSPHATE 10 MG/ML IJ SOLN
INTRAMUSCULAR | Status: AC
  Filled 2018-09-15: qty 1

## 2018-09-15 MED ORDER — HYDRALAZINE HCL 20 MG/ML IJ SOLN
10.0000 mg | Freq: Once | INTRAMUSCULAR | Status: AC
  Administered 2018-09-15: 10 mg via INTRAVENOUS

## 2018-09-15 MED ORDER — MEPERIDINE HCL 50 MG/ML IJ SOLN: 6.2500 mg | INTRAMUSCULAR | Status: DC | PRN

## 2018-09-15 MED ORDER — LIDOCAINE 2% (20 MG/ML) 5 ML SYRINGE
INTRAMUSCULAR | Status: AC
  Filled 2018-09-15: qty 5

## 2018-09-15 SURGICAL SUPPLY — 15 items
BAG URINE DRAINAGE (UROLOGICAL SUPPLIES) ×2 IMPLANT
BAG URO CATCHER STRL LF (MISCELLANEOUS) ×2 IMPLANT
CATH FOLEY 2WAY SLVR  5CC 16FR (CATHETERS) ×1
CATH FOLEY 2WAY SLVR 5CC 16FR (CATHETERS) ×1 IMPLANT
COVER WAND RF STERILE (DRAPES) IMPLANT
ELECT REM PT RETURN 15FT ADLT (MISCELLANEOUS) IMPLANT
GLOVE BIOGEL M STRL SZ7.5 (GLOVE) ×6 IMPLANT
GOWN STRL REUS W/TWL LRG LVL3 (GOWN DISPOSABLE) ×4 IMPLANT
LOOP CUT BIPOLAR 24F LRG (ELECTROSURGICAL) ×2 IMPLANT
MANIFOLD NEPTUNE II (INSTRUMENTS) ×2 IMPLANT
PACK CYSTO (CUSTOM PROCEDURE TRAY) ×2 IMPLANT
SET ASPIRATION TUBING (TUBING) IMPLANT
SYRINGE IRR TOOMEY STRL 70CC (SYRINGE) ×2 IMPLANT
TUBING CONNECTING 10 (TUBING) ×2 IMPLANT
TUBING UROLOGY SET (TUBING) IMPLANT

## 2018-09-15 NOTE — Anesthesia Preprocedure Evaluation (Addendum)
Anesthesia Evaluation  Patient identified by MRN, date of birth, ID band Patient awake    Reviewed: Allergy & Precautions, H&P , NPO status , Patient's Chart, lab work & pertinent test results, reviewed documented beta blocker date and time   Airway Mallampati: II  TM Distance: >3 FB Neck ROM: full    Dental no notable dental hx.    Pulmonary asthma , former smoker,    Pulmonary exam normal breath sounds clear to auscultation       Cardiovascular Exercise Tolerance: Good hypertension, Pt. on medications + DOE   Rhythm:regular Rate:Normal     Neuro/Psych PSYCHIATRIC DISORDERS negative neurological ROS     GI/Hepatic negative GI ROS, Neg liver ROS,   Endo/Other  negative endocrine ROS  Renal/GU negative Renal ROS  negative genitourinary   Musculoskeletal   Abdominal   Peds  Hematology negative hematology ROS (+)   Anesthesia Other Findings former smoker (12.5 pack years, quit 09/08/12) with h/o HTN, asthma, chronic pain, prostate cancer metastatic to pelvic, bladder tumor scheduled for above procedure 09/15/18 with Dr. Raynelle Bring  Reproductive/Obstetrics negative OB ROS                            Anesthesia Physical Anesthesia Plan  ASA: III  Anesthesia Plan: General   Post-op Pain Management:    Induction: Intravenous  PONV Risk Score and Plan: 2 and Ondansetron, Treatment may vary due to age or medical condition and Dexamethasone  Airway Management Planned: Oral ETT  Additional Equipment:   Intra-op Plan:   Post-operative Plan: Extubation in OR  Informed Consent: I have reviewed the patients History and Physical, chart, labs and discussed the procedure including the risks, benefits and alternatives for the proposed anesthesia with the patient or authorized representative who has indicated his/her understanding and acceptance.     Dental Advisory Given  Plan Discussed  with: CRNA, Anesthesiologist and Surgeon  Anesthesia Plan Comments: (  )        Anesthesia Quick Evaluation

## 2018-09-15 NOTE — Transfer of Care (Signed)
Immediate Anesthesia Transfer of Care Note  Patient: [name] Holder  Procedure(s) Performed: TRANSURETHRAL RESECTION OF BLADDER TUMOR (TURBT)WITH CYSTOSCOPY/ POST OPERATIVE INSTILLATION OF GEMCITABINE (N/A )  Patient Location: PACU  Anesthesia Type:General  Level of Consciousness: drowsy  Airway & Oxygen Therapy: Patient Spontanous Breathing and Patient connected to face mask oxygen  Post-op Assessment: Report given to RN and Post -op Vital signs reviewed and stable  Post vital signs: Reviewed and stable  Last Vitals:  Vitals Value Taken Time  BP    Temp    Pulse    Resp    SpO2      Last Pain:  Vitals:   09/15/18 1155  TempSrc:   PainSc: 4       Patients Stated Pain Goal: 3 (27/07/86 7544)  Complications: No apparent anesthesia complications

## 2018-09-15 NOTE — Progress Notes (Signed)
D; Pt was very agitated, fent. 57 mch IV given @ 1540& 1545, Versed 2Mg  IV@ 1540,  20mg  precedexIV by Dr. Ambrose Pancoast @ [phone] 40mg  precedex IV by Dr. Ambrose Pancoast @ [phone]

## 2018-09-15 NOTE — Progress Notes (Signed)
PMT progress note  Patient seen earlier this am, he is resting in bed. Patient reportedly was confused overnight and required some prn Ativan.   Patient awakens and does interact some, he doesn't verbalize as much as he did yesterday. His wife is not currently at bedside.  Chart reviewed.   BP (!) 175/113 (BP Location: Left Arm)   Pulse 81   Temp (!) 97.5 F (36.4 C) (Oral)   Resp 18   Ht 5\' 7"  (1.702 m)   Wt 78.4 kg   SpO2 99%   BMI 27.08 kg/m  Labs and imaging noted Med history, including PRN pain medication use noted.   Awakens, not as alert Regular S1 S 2 Abdomen is soft No edema  PPS 30%  metastatic prostate cancer   back pain and imaging has suggested new compression fractures of the thoracic T8, T9 and lumbar L3 spine Continue current pain regimen and monitor. No additional PMT specific recommendations at this time.   25 minutes spent. Loistine Chance MD [place] palliative medicine team [phone]

## 2018-09-15 NOTE — Anesthesia Procedure Notes (Signed)
Procedure Name: Intubation Date/Time: 09/15/2018 2:22 PM Performed by: Janeece Riggers, MD Patient Re-evaluated:Patient Re-evaluated prior to induction Oxygen Delivery Method: Circle system utilized Preoxygenation: Pre-oxygenation with 100% oxygen Induction Type: IV induction Ventilation: Mask ventilation without difficulty and Oral airway inserted - appropriate to patient size Laryngoscope Size: Mac and 4 Grade View: Grade II Tube type: Oral Tube size: 8.0 mm Number of attempts: 2 Airway Equipment and Method: Stylet Placement Confirmation: ETT inserted through vocal cords under direct vision,  positive ETCO2 and breath sounds checked- equal and bilateral Secured at: 21 cm Tube secured with: Tape Dental Injury: Teeth and Oropharynx as per pre-operative assessment

## 2018-09-15 NOTE — Progress Notes (Addendum)
PROGRESS NOTE  [name] Holder INO:[id number] DOB: 08/11/1955 DOA: 09/12/2018 PCP: [name], MD  Dr. Alen Blew  Brief History   63 year old man PMH castration resistant prostate cancer, multiple compression fractures causing chronic pain, history of kyphoplasty T10, T12, L1, L2, recently started on MS Contin for chronic pain, however developed hallucinations and sedation and was sent to the hospital for further evaluation for acute encephalopathy.  A & P  Acute encephalopathy, delirium secondary to adverse reaction to MS Contin, complicated by chronic back pain.  UTI was questioned on admission. Patient never had any urinary symptoms.  His symptoms have significantly improved even though urine shows enterococcus and he has been treated with ceftriaxone, arguing against infection.  In fact his history is most suggestive of adverse reaction to MS Contin. --Seems to wax and wane, possibly exacerbated by Ativan last night.  Worse today.  Bacteriuria --Given that the patient is having a urologic procedure it is prudent to treat with antibiotics as directed per Dr. Alinda Money.  New vertebral compression fractures superimposed on chronic back pain secondary to compression fracture T10, T12.  Status post kyphoplasty with bone biopsy which showed no malignancy. --MS Contin stopped, started on OxyContin given previous tolerance for oxycodone.  Appreciate palliative medicine adjusting medications for pain control. --Can follow-up with interventional radiology as an outpatient for consideration of kyphoplasty  Scheduled for transurethral resection of bladder tumor 2/20 with Dr. Alinda Money --Status post surgery today.  Plan for total 5-day course with amoxicillin for bacteriuria as recommended by Dr. Alinda Money.  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   --Currently on Zytiga daily with prednisone 5 mg daily. Status post kyphoplasty January 31. --Zytiga held by the pharmacy  team automatically due to question of possible infection.  However as he has no significant infection this can be resumed as per Dr. Alinda Money.   Overall stable but confusion is worse currently.  DVT prophylaxis: SCDs Code Status: Full Family Communication: none Disposition Plan: home    [name] Hodgkins, MD  [facility] Direct contact: see [email or web address]  7PM-7AM contact night coverage as above 09/15/2018, 4:53 PM  LOS: 2 days   Consultants  . Interventional radiology . Urology  Procedures  1.  Cystoscopy 2.  Pelvic exam under anesthesia 3.  Transurethral resection of bladder tumor (1.2 cm) 4.  Postoperative instillation of intravesical gemcitabine chemotherapy  Antibiotics  .   Interval History/Subjective  Confused last night given Ativan twice.  Has been impulsive today, attempting to get out of bed, confused.  Objective   Vitals:  Vitals:   09/15/18 1630 09/15/18 1645  BP: 132/72 118/74  Pulse: 79 96  Resp: (!) 26 (!) 25  Temp:  97.7 F (36.5 C)  SpO2: 100% 97%    Exam:  Constitutional:   . Appears slightly restless but comfortable Eyes:  . pupils and irises appear normal ENMT:  . grossly normal hearing  Respiratory:  . CTA bilaterally, no w/r/r.  . Respiratory effort normal.  Cardiovascular:  . RRR, no m/r/g . No LE extremity edema   Abdomen:  . Soft Musculoskeletal:  . RUE, LUE, RLE, LLE   . strength and tone grossly normal Psychiatric:  . Mental status . Confused today, unaware of current setting or year.   I have personally reviewed the following:   Today's Data  . Creatinine within normal limits  Lab Data  . Noted  Micro Data  . Urine culture with enterococcus, sensitivities noted  Imaging  . Chest x-ray  no acute disease. . CT head no acute disease. . CTA chest no PE. . CT abdomen pelvis showed new compression fractures at T8, T9, L3  Cardiology Data  .   Other Data  .   Scheduled Meds: . [MAR Hold] abiraterone  acetate  1,000 mg Oral q1800  . amoxicillin  500 mg Oral Q8H  . [MAR Hold] carvedilol  3.125 mg Oral BID WC  . fentaNYL      . hydrALAZINE      . [MAR Hold] ipratropium-albuterol  3 mL Nebulization BID  . [MAR Hold] irbesartan  300 mg Oral Daily  . [MAR Hold] lidocaine  1 patch Transdermal Q24H  . [MAR Hold] methocarbamol  500 mg Oral BID  . midazolam      . [MAR Hold] mometasone-formoterol  2 puff Inhalation BID  . [MAR Hold] mupirocin ointment  1 application Nasal BID  . [MAR Hold] oxyCODONE  15 mg Oral Q12H  . [MAR Hold] predniSONE  5 mg Oral BID WC  . [MAR Hold] senna-docusate  1 tablet Oral BID   Continuous Infusions: . [MAR Hold] sodium chloride 10 mL/hr (09/15/18 0002)  . [MAR Hold] vancomycin 1,000 mg (09/15/18 8891)    Principal Problem:   Delirium Active Problems:   Essential hypertension   History of prostate cancer   Secondary malignant neoplasm of bone and bone marrow (HCC)   Pathologic compression fracture of spine, initial encounter ([place])   Acute lower UTI   Cancer associated pain   Bladder cancer ([place])   Acute encephalopathy   Palliative care by specialist   Pain management   LOS: 2 days

## 2018-09-15 NOTE — Op Note (Signed)
Preoperative diagnosis: Bladder tumor  Postoperative diagnosis: Bladder tumor  Procedure: 1.  Cystoscopy 2.  Pelvic exam under anesthesia 3.  Transurethral resection of bladder tumor (1.2 cm) 4.  Postoperative instillation of intravesical gemcitabine chemotherapy  Surgeon: Pryor Curia MD  Anesthesia: General  Complications: None  Estimated blood loss: Minimal  Intraoperative findings: He was noted to have a small papillary tumor on the left lateral wall of the bladder measuring approximately 1.2 cm.  No other mucosal abnormalities were identified.  Specimens: Bladder tumor  Disposition of specimens: Pathology  Indication: [name] is a 63 year old gentleman with castrate resistant metastatic prostate cancer.  As part of his recent staging evaluation, he was incidentally found to have a small bladder mass that was confirmed on office cystoscopy to represent a probable urothelial carcinoma of the bladder.  He presents today for definitive evaluation.  After reviewing options for management, the potential risks, complications, and alternative options were discussed and he elected to proceed with the above procedures.  He was recently admitted for worsening back pain due to compression fractures of his spine.  He therefore is proceeding with therapy during his hospitalization after discussing the risks and benefits with him.  Description of procedure: The patient was taken to the operating room and a general anesthetic was administered.  He was given preoperative antibiotics, placed in the dorsolithotomy position, and prepped and draped in the usual sterile fashion.  Of note, he did have a positive urine culture despite the absence of symptoms.  He was therefore treated with appropriate antibiotic therapy preoperatively for his asymptomatic bacteriuria.  Cystourethroscopy was then performed with a 30 degree and 70 degree lens to examine the mucosa of the bladder.  A 1.2 cm papillary  appearing tumor was located off the left lateral bladder wall far away from the left ureteral orifice.  Both ureteral orifice ease were identified and were effluxing clear urine.  No other significant mucosal abnormalities were identified except for mild bladder trabeculation.  Leander Rams sounds were then used to serially dilate the urethra from 22 [place] up to 30 [place].  This allowed placement of the 26 French resectoscope sheath using the visual obturator under direct vision.  Using a cutting bipolar loop, and after ensuring paralysis, transurethral resection of the patient's bladder tumor was performed.  This was sent as a single specimen.  It did appear that adequate sampling of the muscularis propria was performed and deeper biopsies were not taken.  Hemostasis was achieved with bipolar cautery.  The bladder was emptied and reinspected and hemostasis appeared excellent.  A 16 French Foley catheter was placed.  The patient was able to be extubated and transferred to the recovery room in in satisfactory condition.  In the PACU, 2 g of intravesical gemcitabine and saline solution was instilled into the patient's bladder via the catheter.  This was left indwelling for 1 hour and then drained.

## 2018-09-15 NOTE — Anesthesia Postprocedure Evaluation (Signed)
Anesthesia Post Note  Patient: NIKAI QUEST  Procedure(s) Performed: TRANSURETHRAL RESECTION OF BLADDER TUMOR (TURBT)WITH CYSTOSCOPY/ POST OPERATIVE INSTILLATION OF GEMCITABINE (N/A )     Patient location during evaluation: PACU Anesthesia Type: General Level of consciousness: awake and alert Pain management: pain level controlled Vital Signs Assessment: post-procedure vital signs reviewed and stable Respiratory status: spontaneous breathing, nonlabored ventilation, respiratory function stable and patient connected to nasal cannula oxygen Cardiovascular status: blood pressure returned to baseline and stable Postop Assessment: no apparent nausea or vomiting Anesthetic complications: no    Last Vitals:  Vitals:   09/15/18 1645 09/15/18 1704  BP: 118/74 131/84  Pulse: 96 98  Resp: (!) 25   Temp: 36.5 C 36.5 C  SpO2: 97% 97%    Last Pain:  Vitals:   09/15/18 1704  TempSrc: Oral  PainSc:                  Grayling Schranz

## 2018-09-15 NOTE — Progress Notes (Signed)
Patient ID: [name] Holder, male   DOB: Dec 24, 1955, 63 y.o.   MRN: [id number]  Day of Surgery Subjective: Pt stable without significant change from yesterday.  On Vancomycin for Enterococcus UTI.  Objective: Vital signs in last 24 hours: Temp:  [97.5 F (36.4 C)-98.1 F (36.7 C)] 97.5 F (36.4 C) (02/20 0533) Pulse Rate:  [81-99] 81 (02/20 0939) Resp:  [18-22] 18 (02/20 0939) BP: (147-175)/(90-113) 175/113 (02/20 0533) SpO2:  [96 %-100 %] 99 % (02/20 0939)  Intake/Output from previous day: 02/19 0701 - 02/20 0700 In: 1169.1 [P.O.:600; I.V.:169.1; IV Piggyback:400] Out: 600 [Urine:600] Intake/Output this shift: Total I/O In: -  Out: 220 [Urine:220]  Physical Exam:  General: Alert and oriented Abdomen: Soft, ND, No CVAT Ext: NT, No erythema  Lab Results: Recent Labs    09/12/18 2255 09/14/18 0546  HGB 13.1 11.4*  HCT 38.5* 35.0*   BMET Recent Labs    09/12/18 2255 09/13/18 0554 09/14/18 0546 09/15/18 0601  NA 135  --  138  --   K 2.7* 3.1* 3.9  --   CL 103  --  111  --   CO2 23  --  19*  --   GLUCOSE 105*  --  89  --   BUN 7*  --  10  --   CREATININE 0.99  --  0.73 0.61  CALCIUM 9.4  --  8.5*  --      Studies/Results: No results found.  Assessment/Plan: Cystoscopy with bilateral RPGs and TURBT today. I discussed the potential benefits and risks of the procedure, side effects of the proposed treatment, the likelihood of the patient achieving the goals of the procedure, and any potential problems that might occur during the procedure or recuperation.    LOS: 2 days   Dutch Gray 09/15/2018, 1:59 PM

## 2018-09-15 NOTE — Progress Notes (Signed)
Patient ID: [name] Holder, male   DOB: 03-13-1956, 63 y.o.   MRN: [id number]  Bladder tumor resected without complications.  Will stop vancomycin and started amoxicillin to treated bacteruria.  Would continue for full 5 day course of culture specific antibiotics.

## 2018-09-16 ENCOUNTER — Encounter (HOSPITAL_COMMUNITY): Payer: Self-pay | Admitting: Urology

## 2018-09-16 NOTE — Progress Notes (Addendum)
PROGRESS NOTE  [name] Holder XBW:[id number] DOB: Mar 30, 1956 DOA: 09/12/2018 PCP: [name], MD  Dr. Alen Blew  Brief History   63 year old man PMH castration resistant prostate cancer, multiple compression fractures causing chronic pain, history of kyphoplasty T10, T12, L1, L2, recently started on MS Contin for chronic pain, however developed hallucinations and sedation and was sent to the hospital for further evaluation for acute encephalopathy.  A & P  Acute encephalopathy, delirium secondary to adverse reaction to MS Contin, complicated by chronic back pain.  UTI was questioned on admission. Patient never had any urinary symptoms.  His symptoms have significantly improved even though urine shows enterococcus and he has been treated with ceftriaxone, arguing against infection.  In fact his history is most suggestive of adverse reaction to MS Contin. --Overall improving.  Avoid benzodiazepine if possible.  Continue narcotics for pain control.  Bacteriuria --Given that the patient is having a urologic procedure it is prudent to treat with antibiotics as directed per Dr. Alinda Money.  Plan on amoxicillin to complete course as recommended.  New vertebral compression fractures superimposed on chronic back pain secondary to compression fracture T10, T12.  Status post kyphoplasty with bone biopsy which showed no malignancy. --MS Contin stopped, started on OxyContin given previous tolerance for oxycodone.  Appreciate palliative medicine adjusting medications for pain control.  Overall improving. --Can follow-up with interventional radiology as an outpatient for consideration of kyphoplasty  Scheduled for transurethral resection of bladder tumor 2/20 with Dr. Alinda Money --Status post surgery 2/20.  Pathology shows low-grade urothelial carcinoma.  Follow-up with urology as an outpatient.  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   --Currently on Zytiga daily with  prednisone 5 mg daily. Status post kyphoplasty January 31. --Zytiga held by the pharmacy team automatically due to question of possible infection.  However as he has no significant infection this can be resumed as per Dr. Alinda Money.   Overall appears to be improving although still has hallucinations and some confusion.  Evaluated by physical therapy with recommendation for SNF placement.  DVT prophylaxis: SCDs Code Status: Full Family Communication: none Disposition Plan: SNF?   [name], MD  [facility] Direct contact: see [email or web address]  7PM-7AM contact night coverage as above 09/16/2018, 6:18 PM  LOS: 3 days   Consultants  . Interventional radiology . Urology  Procedures  1.  Cystoscopy 2.  Pelvic exam under anesthesia 3.  Transurethral resection of bladder tumor (1.2 cm) 4.  Postoperative instillation of intravesical gemcitabine chemotherapy  Antibiotics  .   Interval History/Subjective  Less confusion last night.,  Slept better.  Today still having some hallucinations but mentation is clear.  Wife at bedside.  Objective   Vitals:  Vitals:   09/16/18 0820 09/16/18 1345  BP:  137/81  Pulse:  91  Resp:  15  Temp:  97.8 F (36.6 C)  SpO2: 97% 97%    Exam:  Constitutional:   . Appears calm and comfortable Eyes:  . pupils and irises appear normal ENMT:  . grossly normal hearing  Respiratory:  . CTA bilaterally, no w/r/r.  . Respiratory effort normal. Cardiovascular:  . RRR, no m/r/g . No LE extremity edema   Musculoskeletal:  . RLE, LLE   . Moves both legs to command Psychiatric:  . Mental status o Mood appropriate, affect slightly odd . Appears slightly confused but cooperates with examination    I have personally reviewed the following:   Today's Data  . Creatinine within normal limits  Lab Data  . Noted  Micro Data  . Urine culture with enterococcus, sensitivities noted  Imaging  . Chest x-ray no acute disease. . CT head  no acute disease. . CTA chest no PE. . CT abdomen pelvis showed new compression fractures at T8, T9, L3  Cardiology Data  .   Other Data  .   Scheduled Meds: . abiraterone acetate  1,000 mg Oral q1800  . amoxicillin  500 mg Oral Q8H  . carvedilol  3.125 mg Oral BID WC  . ipratropium-albuterol  3 mL Nebulization BID  . irbesartan  300 mg Oral Daily  . lidocaine  1 patch Transdermal Q24H  . methocarbamol  500 mg Oral BID  . mometasone-formoterol  2 puff Inhalation BID  . mupirocin ointment  1 application Nasal BID  . oxyCODONE  15 mg Oral Q12H  . predniSONE  5 mg Oral BID WC  . senna-docusate  1 tablet Oral BID   Continuous Infusions: . sodium chloride 10 mL/hr (09/15/18 0002)    Principal Problem:   Delirium Active Problems:   Essential hypertension   History of prostate cancer   Secondary malignant neoplasm of bone and bone marrow (HCC)   Pathologic compression fracture of spine, initial encounter (HCC)   Acute lower UTI   Cancer associated pain   Bladder cancer ([place])   Acute encephalopathy   Palliative care by specialist   Pain management   LOS: 3 days

## 2018-09-16 NOTE — Progress Notes (Signed)
Patient ID: [name] Holder, male   DOB: [birth year], 63 y.o.   MRN: [id number]  1 Day Post-Op Subjective: Pt had increased pain and confusion yesterday.  Better this morning with more control of pain.  He has had minimal blood in urine.  Objective: Vital signs in last 24 hours: Temp:  [97.5 F (36.4 C)-98.4 F (36.9 C)] 98.4 F (36.9 C) (02/21 0509) Pulse Rate:  [78-108] 108 (02/21 0509) Resp:  [16-27] 18 (02/21 0509) BP: (108-188)/(60-114) 127/81 (02/21 0509) SpO2:  [97 %-100 %] 100 % (02/21 0509)  Intake/Output from previous day: 02/20 0701 - 02/21 0700 In: 1381.3 [P.O.:600; I.V.:481.3; IV Piggyback:300] Out: 670 [Urine:670] Intake/Output this shift: No intake/output data recorded.  Physical Exam:  General: Alert and oriented Abd: Soft, ND  Lab Results: Recent Labs    09/14/18 0546  HGB 11.4*  HCT 35.0*   BMET Recent Labs    09/14/18 0546 09/15/18 0601  NA 138  --   K 3.9  --   CL 111  --   CO2 19*  --   GLUCOSE 89  --   BUN 10  --   CREATININE 0.73 0.61  CALCIUM 8.5*  --      Studies/Results: Pathology pending.  Assessment/Plan: 1) Bladder tumor: S/P TURBT yesterday.  Pathology pending.  He has scheduled follow up with me next Friday. 2) CRPC: PSA was 0.48 here indicating good control of his disease.  Continue abiraterone/prednisone and ADT.  He is due for Lupron injection next Friday as an outpatient.  He has follow up scheduled with Dr. Alen Blew in March. 3) Back pain: Still significant.  Likely plan is for repeat kyphoplasty as outpatient.  Do not suspect pathologic fractures. 4) Bacteruria: Continue amoxicillin through 2/24 to complete treatment in perioperative setting of TURBT.   LOS: 3 days   Dutch Gray 09/16/2018, 8:08 AM

## 2018-09-16 NOTE — Evaluation (Signed)
Physical Therapy Evaluation Patient Details Name: [name] Holder MRN: [id number] DOB: November 16, 1955 Today's Date: 09/16/2018   History of Present Illness  63 yo male admitted to ED on 2/17 for medication-associated delirium. Pt with metastatic prostate cancer dx in 2014 with mets to bone/spine. head CT - for acute findings, CT abdomen/pelvis reveals new pathologic compression fractures of T8, T9, L3. Pt with history of kyphoplasty for pathologic compression fractures T10,T12, L1,L2 08/26/18. Imaging of chest reveals atelectasis of L mid to lower lung and air distention of small bowel. Pt s/p transurethral resection of bladder tumor 09/15/18. Other PMH includes chronic LBP, chronic pain, PNA, DOE, osteopenia.   Clinical Impression   Pt presents with severe back pain, LE weakness in both muscular strength and muscular endurance, difficulty performing all mobility tasks, decreased tolerance for ambulation, lack of safety awareness, and impulsivity with mobility. Pt to benefit from acute PT to address deficits. Pt required between min and mod assist for mobility tasks this session, and requires multimodal cues/reinforcement for safe mobility. Pt's wife present during the session, and states she works as a Tourist information centre manager and cannot be home 24/7 at this time to care for pt, and she is concerned about pt's mobility deficits at this time. Pt's wife is very supportive, and wants pt to improve mobility to PLOF. PT recommending ST-SNF placement given pt's mobility deficits and lack of safety awareness. Pt seems skeptical of SNF recommendation, but is more open to discussion of rehab if it is short term. Pt agrees he is not safe to d/c home at this time. PT to progress mobility as tolerated, and will continue to follow acutely.      Follow Up Recommendations SNF;Supervision/Assistance - 24 hour    Equipment Recommendations  None recommended by PT    Recommendations for Other Services       Precautions /  Restrictions Precautions Precautions: Fall Precaution Comments: Pt with pathlogic vertebral compression fractures T8, T9, L3 with recent kyphoplasty for T10, T12, L1, L2. Log roll technique utilized for protection of spine, no orders for brace.  Restrictions Weight Bearing Restrictions: No      Mobility  Bed Mobility Overal bed mobility: Needs Assistance Bed Mobility: Supine to Sit;Sit to Supine     Supine to sit: Min assist;HOB elevated Sit to supine: Mod assist;HOB elevated   General bed mobility comments: Min assist for supine to sit for sequencing with log roll technique for protection of back, physical assist for elevating trunk and lowering legs simultaneously. Mod assist for sit to supine for trunk lowering and LE lifting into bed, use of boost function on bed to   Transfers Overall transfer level: Needs assistance Equipment used: Rolling walker (2 wheeled) Transfers: Sit to/from Stand Sit to Stand: Min assist;From elevated surface         General transfer comment: Min assist for power up, steadying. When moving from stand to sit, pt attempting to sit before close enough to the bed, did not bring RW back with him. Multimodal cues for safety.   Ambulation/Gait Ambulation/Gait assistance: Min assist Gait Distance (Feet): 50 Feet Assistive device: Rolling walker (2 wheeled) Gait Pattern/deviations: Step-through pattern;Decreased stride length;Staggering right;Drifts right/left;Trunk flexed Gait velocity: decr    General Gait Details: Min assist for steadying, directing pt. Mod verbal cuing for placement in RW, tracking RW straight as pt with tendency to veer right. Pt fatigued with ambulation quickly.   Stairs            Emergency planning/management officer  Modified Rankin (Stroke Patients Only)       Balance Overall balance assessment: Needs assistance Sitting-balance support: Feet supported Sitting balance-Leahy Scale: Good     Standing balance support: Bilateral  upper extremity supported Standing balance-Leahy Scale: Poor Standing balance comment: relies on RW and PT for steadying                              Pertinent Vitals/Pain Pain Assessment: Faces Faces Pain Scale: Hurts whole lot Pain Location: back  Pain Descriptors / Indicators: Sore;Constant Pain Intervention(s): Limited activity within patient's tolerance;Repositioned;Monitored during session;Premedicated before session    Home Living Family/patient expects to be discharged to:: Private residence Living Arrangements: Spouse/significant other Available Help at Discharge: Family Type of Home: House Home Access: Stairs to enter Entrance Stairs-Rails: None Entrance Stairs-Number of Steps: 3 Home Layout: One level Home Equipment: Environmental consultant - 2 wheels;Hospital bed;Bedside commode      Prior Function Level of Independence: Needs assistance   Gait / Transfers Assistance Needed: Pt very limited in ambulation over the past 5-6 weeks, and has BSC next to bed to limit ambulation. PT has been in excruciating back pain which has limited mobiity. Pt uses RW to get to steps to leave house for appointments, otherwise pt does not leave the house at this time. Pt with many "close falls"   ADL's / Homemaking Assistance Needed: Wife assists pt with ADLs, and per wife pt is with pt most of the time and if she leaves him for ~2 hours, she is very worried about him on his own.   Comments: Pt does not drive given medications      Hand Dominance   Dominant Hand: Right    Extremity/Trunk Assessment   Upper Extremity Assessment Upper Extremity Assessment: Generalized weakness    Lower Extremity Assessment Lower Extremity Assessment: Generalized weakness(able to perform hip flexion, knee extension, knee flexion, DF/PF bilaterally sitting EOB, but struggles with muscular endurance and full ROM anti-gravity)    Cervical / Trunk Assessment Cervical / Trunk Assessment: Normal   Communication   Communication: No difficulties  Cognition Arousal/Alertness: Awake/alert Behavior During Therapy: Restless;Impulsive Overall Cognitive Status: Impaired/Different from baseline Area of Impairment: Attention;Memory;Following commands;Safety/judgement;Problem solving                   Current Attention Level: Selective Memory: Decreased short-term memory Following Commands: Follows one step commands with increased time Safety/Judgement: Decreased awareness of deficits;Decreased awareness of safety   Problem Solving: Difficulty sequencing;Requires verbal cues;Requires tactile cues General Comments: Pt admitted with hallucinations, pt reports still having hallucinations but knows they are not real. Pt attempts to move quickly and lacks safety awareness with mobility, requiring max verbal and tactile cuing for safety.       General Comments      Exercises     Assessment/Plan    PT Assessment Patient needs continued PT services  PT Problem List Decreased strength;Pain;Decreased activity tolerance;Decreased knowledge of use of DME;Decreased balance;Decreased safety awareness;Decreased mobility;Decreased knowledge of precautions       PT Treatment Interventions DME instruction;Therapeutic activities;Gait training;Therapeutic exercise;Patient/family education;Balance training;Functional mobility training    PT Goals (Current goals can be found in the Care Plan section)  Acute Rehab PT Goals Patient Stated Goal: go home to wife and dogs  PT Goal Formulation: With patient Time For Goal Achievement: 09/30/18 Potential to Achieve Goals: Good    Frequency Min 2X/week   Barriers to discharge  Co-evaluation               AM-PAC PT "6 Clicks" Mobility  Outcome Measure Help needed turning from your back to your side while in a flat bed without using bedrails?: A Lot Help needed moving from lying on your back to sitting on the side of a flat bed  without using bedrails?: A Lot Help needed moving to and from a bed to a chair (including a wheelchair)?: A Little Help needed standing up from a chair using your arms (e.g., wheelchair or bedside chair)?: A Little Help needed to walk in hospital room?: A Little Help needed climbing 3-5 steps with a railing? : A Lot 6 Click Score: 15    End of Session Equipment Utilized During Treatment: Gait belt Activity Tolerance: Patient limited by fatigue;Patient limited by pain Patient left: in bed;with bed alarm set;with call bell/phone within reach;with family/visitor present Nurse Communication: Mobility status PT Visit Diagnosis: Other abnormalities of gait and mobility (R26.89);Muscle weakness (generalized) (M62.81)    Time: 3149-7026 PT Time Calculation (min) (ACUTE ONLY): 37 min   Charges:   PT Evaluation $PT Eval Low Complexity: 1 Low PT Treatments $Gait Training: 8-22 mins       Julien Girt, PT Acute Rehabilitation Services Pager [phone]  Office [phone]   Roxine Caddy D Elonda Husky 09/16/2018, 6:13 PM

## 2018-09-16 NOTE — Progress Notes (Signed)
PMT progress note  Patient seen   this am, he had a decent breakfast, his wife is at the bedside, mentally, more alert, states that overall he has some relief from his back discomfort. He did not rest well overnight. He is asking when he will be going home.    Chart reviewed.   BP 127/81 (BP Location: Right Arm)   Pulse (!) 108   Temp 98.4 F (36.9 C) (Oral)   Resp 18   Ht 5\' 7"  (1.702 m)   Wt 78.4 kg   SpO2 97%   BMI 27.08 kg/m  Labs and imaging noted Med history, including PRN pain medication use noted.   Awakens,   alert Regular S1 S 2 Abdomen is soft No edema  PPS 30%  metastatic prostate cancer   back pain and imaging has suggested new compression fractures of the thoracic T8, T9 and lumbar L3 spine Continue current pain regimen and monitor. Will need home with home health care on discharge, will request care manager input.  Discussed with patient about his pain regimen, offered active listening and supportive care.   35 minutes spent. Loistine Chance MD [place] palliative medicine team [phone]

## 2018-09-17 LAB — CREATININE, SERUM
Creatinine, Ser: 0.57 mg/dL — ABNORMAL LOW (ref 0.61–1.24)
GFR calc Af Amer: >60 mL/min (ref >60)
GFR calc non Af Amer: >60 mL/min (ref >60)

## 2018-09-17 MED ORDER — DICLOFENAC SODIUM 1 % TD GEL
4.0000 g | Freq: Four times a day (QID) | TRANSDERMAL | Status: DC
  Administered 2018-09-17 – 2018-09-22 (×10): 4 g via TOPICAL
  Filled 2018-09-17: qty 100

## 2018-09-17 MED ORDER — HALOPERIDOL LACTATE 5 MG/ML IJ SOLN
3.0000 mg | Freq: Once | INTRAMUSCULAR | Status: AC | PRN
  Administered 2018-09-17: 3 mg via INTRAVENOUS
  Filled 2018-09-17: qty 1

## 2018-09-17 MED ORDER — HALOPERIDOL LACTATE 5 MG/ML IJ SOLN
2.5000 mg | Freq: Once | INTRAMUSCULAR | Status: AC
  Administered 2018-09-17: 2.5 mg via INTRAVENOUS
  Filled 2018-09-17: qty 1

## 2018-09-17 MED ORDER — HALOPERIDOL LACTATE 5 MG/ML IJ SOLN: 2.5000 mg | Freq: Once | INTRAMUSCULAR | Status: DC

## 2018-09-17 MED ORDER — POLYETHYLENE GLYCOL 3350 17 G PO PACK
17.0000 g | PACK | Freq: Two times a day (BID) | ORAL | Status: DC
  Administered 2018-09-17 – 2018-09-18 (×4): 17 g via ORAL
  Filled 2018-09-17 (×6): qty 1

## 2018-09-17 NOTE — Progress Notes (Signed)
Pt confused with hallucinations this PM. Pt continues to get out of bed stating that he "needs to find his room." On call NP notified, and orders received. Pt reassured and reoriented to place and situation. Pt stable back in bed.

## 2018-09-17 NOTE — Progress Notes (Signed)
Patient ID: [name] Holder, male   DOB: 05/25/1956, 63 y.o.   MRN: [id number]  2 Days Post-Op Subjective: Pt voiding clear urine.  Some dysuria as expected.  Pain better controlled on current regimen.  Objective: Vital signs in last 24 hours: Temp:  [97.8 F (36.6 C)-98.7 F (37.1 C)] 98.7 F (37.1 C) (02/22 0626) Pulse Rate:  [91-97] 97 (02/22 0626) Resp:  [15-20] 20 (02/22 0626) BP: (137-159)/(81-86) 159/86 (02/22 0626) SpO2:  [96 %-99 %] 97 % (02/22 0750)  Intake/Output from previous day: 02/21 0701 - 02/22 0700 In: -  Out: 525 [Urine:525] Intake/Output this shift: No intake/output data recorded.  Physical Exam:  General: Alert and oriented   Lab Results: No results for input(s): HGB, HCT in the last 72 hours. BMET Recent Labs    09/15/18 0601 09/17/18 0455  CREATININE 0.61 0.57*     Studies/Results: Path: Low grade, Ta urothelial carcinoma  Assessment/Plan: 1) Bladder cancer: Reviewed pathology with [name] Holder this morning.  Will need outpatient cystoscopic surveillance in 3 months.  2) Bacteruria: Continue amoxicillin through 5 days of therapy.  3) CRPC: PSA well suppressed when checked this week.  Follow up next Friday as scheduled.  Please call if further questions.   LOS: 4 days   [name] Holder 09/17/2018, 9:13 AM

## 2018-09-17 NOTE — Clinical Social Work Note (Signed)
Clinical Social Work Assessment  Patient Details  Name: [name] Holder MRN: [id number] Date of Birth: [birth year]  Date of referral:  09/17/18               Reason for consult:  Facility Placement                Permission sought to share information with:  Family Supports Permission granted to share information::  Yes, Verbal Permission Granted  Name::     wife Animal nutritionist::     Relationship::     Contact Information:     Housing/Transportation Living arrangements for the past 2 months:  Single Family Home Source of Information:  Patient, Medical Team, Spouse Patient Interpreter Needed:  None Criminal Activity/Legal Involvement Pertinent to Current Situation/Hospitalization:  No - Comment as needed Significant Relationships:  Spouse, Siblings Lives with:  Spouse Do you feel safe going back to the place where you live?  Yes Need for family participation in patient care:  Yes (Comment)(wife)  Care giving concerns:  Pt admitted from home where he resides with his wife. He was admitted 2/18 for encephalopathy, has improved. Pt has history or compression fractures and presents with new ones as well. States for past 2 weeks "he has just been lying in bed." Wife assists him at home, pt uses walker and has BC. Pt had surgery to remove bladder mass 09/15/18, has hx of prostate ca as well.  Social Worker assessment / plan:  CSW consulted to assist with disposition- SNF recommended for rehab. Discussed with wife and pt at bedside- pt left room to ambulate with therapy during assessment and CSW continued assessment with wife. Pt was oriented and answered questions appropriate, affect was engaged but flat, but did not appear confused currently. Wife is requesting that pt be evaluated by CIR as she feels SNF rehab "will not be enough hours of therapy as what he needs." CSW and OT explained CIR v SNF programming and placement processes, wife understanding.  Considering CIR consult but also  pursuing SNF options for pt. Will need BCBS authorization for either.  Wife adds that pt has long term care insurance if this comes to needing to access it.  Made referrals to SNFs and will follow up with bed offers.   Employment status:    Insurance information:  Managed Care(BCBS) PT Recommendations:  [place] / Referral to community resources:  [place]  Patient/Family's Response to care:  engaged  Patient/Family's Understanding of and Emotional Response to Diagnosis, Current Treatment, and Prognosis:  Pt responds to questions appropriately, makes good eye contact, and engaged well with therapy, but does not ask questions or otherwise remark on care. Wife shows good understanding of treatment describing hx matching charting, and asks pertinent questions important to DC planning. Emotionally seems motivated and hopeful for pt to progress with rehab post DC and continue to manage pain. States, "the most important thig for him will be the environment he is in being motivating for him. He will need the people around him pushing him."  Emotional Assessment Appearance:  Appears stated age Attitude/Demeanor/Rapport:  Engaged Affect (typically observed):  Flat Orientation:  Oriented to Self, Oriented to Place, Oriented to  Time, Oriented to Situation Alcohol / Substance use:  Not Applicable Psych involvement (Current and /or in the community):  No (Comment)  Discharge Needs  Concerns to be addressed:  Discharge Planning Concerns Readmission within the last 30 days:  No Current discharge risk:  Dependent with Mobility, Chronically ill Barriers to Discharge:  Ship broker, Continued Medical Work up   [employer], LCSW 09/17/2018, 2:25 PM [phone] weekend coverage

## 2018-09-17 NOTE — Progress Notes (Signed)
PMT progress note  Patient seen this am, he had a decent breakfast, he rested well overnight. Wife not at bedside this am.   He doesn't appear confused this am, answers all questions appropriately.   He recalls working with PT yesterday, states that his pain is reasonably well controlled.   He has not had a bowel movement in a few days now. He doesn't want rectal suppositories or enema today, wants to continue PO bowel regimen for now.      Chart reviewed.   BP (!) 159/86 (BP Location: Right Arm)   Pulse 97   Temp 98.7 F (37.1 C) (Oral)   Resp 20   Ht 5\' 7"  (1.702 m)   Wt 78.4 kg   SpO2 97%   BMI 27.08 kg/m  Labs and imaging noted Med history, including PRN pain medication use noted.   Awakens,  alert Regular S1 S 2 Abdomen is soft No edema  PPS 30%  metastatic prostate cancer   back pain and imaging has suggested new compression fractures of the thoracic T8, T9 and lumbar L3 spine Continue current pain regimen and monitor.   Discussed with patient about PT's recommendations for SNF rehab. He is in agreement. Will request palliative care follow the patient at his SNF rehab after discharge.   Otherwise, continue current pain medications, no change.   Brief life review performed: patient states that he wants to continue with treatments being offered, he wants to get better and stay better so that he can spend more time with his wife. He worked in Customer service manager. He likes to read history, non fictional and also likes science fiction books. Offered presence, active listening and supportive care.     25 minutes spent. Loistine Chance MD [place] palliative medicine team [phone]

## 2018-09-17 NOTE — Plan of Care (Signed)
Patient continues to c/o severe pain in his back in spite of pain medication, very anxious and restless in bed.  Confused with hallucinations at times but easily reoriented.  See prior note regarding retention and results of I&O cath.  Wife at bedside for much of shift.

## 2018-09-17 NOTE — Progress Notes (Signed)
Pt continues to attempt to void and is unable to. Pt complains of burning with each attempt. Bladder scan showed 207 max retention. On call NP notified via text /page.

## 2018-09-17 NOTE — Progress Notes (Signed)
In and out cath performed, 350 ccs dark yellow urine returned.  Will continue to monitor.

## 2018-09-17 NOTE — NC FL2 (Signed)
[place] LEVEL OF CARE SCREENING TOOL     IDENTIFICATION  Patient Name: [name] Holder Birthdate: 02-Sep-1955 Sex: male Admission Date (Current Location): 09/12/2018  [facility] and [place] Number:  Engineer, manufacturing systems and Address:  [facility],  [address] [place], [place]      Provider Number: [id number]  Attending Physician Name and Address:  Samuella Cota, MD  Relative Name and Phone Number:       Current Level of Care: Hospital Recommended Level of Care: [place] Prior Approval Number:    Date Approved/Denied:   PASRR Number: [id number]  Discharge Plan: SNF    Current Diagnoses: Patient Active Problem List   Diagnosis Date Noted  . Palliative care by specialist   . Pain management   . Pathologic compression fracture of spine, initial encounter ([place]) 09/13/2018  . Delirium 09/13/2018  . Acute lower UTI 09/13/2018  . Cancer associated pain 09/13/2018  . Bladder cancer ([place]) 09/13/2018  . Acute encephalopathy 09/13/2018  . Secondary malignant neoplasm of bone and bone marrow ([place]) 08/06/2018  . Spinal stenosis in cervical region 04/12/2018  . Weakness 04/11/2018  . Elevated LFTs 09/16/2017  . Routine general medical examination at a health care facility 08/29/2015  . Hyperlipidemia with target LDL less than 100 12/21/2013  . Other abnormal glucose 12/21/2013  . Upper airway cough syndrome 07/25/2013  . History of prostate cancer   . Essential hypertension 05/20/2010  . Allergic rhinitis 05/20/2010  . Asthma, mild intermittent 05/20/2010    Orientation RESPIRATION BLADDER Height & Weight     Self, Time, Place, Situation  Normal Continent Weight: 172 lb 14.9 oz (78.4 kg) Height:  5\' 7"  (170.2 cm)  BEHAVIORAL SYMPTOMS/MOOD NEUROLOGICAL BOWEL NUTRITION STATUS      Continent Diet(regular diet)  AMBULATORY STATUS COMMUNICATION OF NEEDS Skin   Extensive Assist Verbally Normal                        Personal Care Assistance Level of Assistance  Bathing, Feeding, Dressing Bathing Assistance: Maximum assistance Feeding assistance: Independent Dressing Assistance: Limited assistance     Functional Limitations Info  Sight, Hearing, Speech Sight Info: Adequate Hearing Info: Adequate Speech Info: Adequate    SPECIAL CARE FACTORS FREQUENCY  PT (By licensed PT), OT (By licensed OT)     PT Frequency: 5x OT Frequency: 5x            Contractures Contractures Info: Not present    Additional Factors Info  Code Status, Allergies Code Status Info: full code Allergies Info:  Amlodipine, Tetracycline           Current Medications (09/17/2018):  This is the current hospital active medication list Current Facility-Administered Medications  Medication Dose Route Frequency Provider Last Rate Last Dose  . 0.9 %  sodium chloride infusion   Intravenous PRN Raynelle Bring, MD 10 mL/hr at 09/15/18 0002 10 mL/hr at 09/15/18 0002  . abiraterone acetate (ZYTIGA) tablet 1,000 mg  1,000 mg Oral q1800 Raynelle Bring, MD   1,000 mg at 09/16/18 1943  . acetaminophen (TYLENOL) tablet 650 mg  650 mg Oral Q6H PRN Raynelle Bring, MD   650 mg at 09/16/18 1035   Or  . acetaminophen (TYLENOL) suppository 650 mg  650 mg Rectal Q6H PRN Raynelle Bring, MD      . albuterol (PROVENTIL) (2.5 MG/3ML) 0.083% nebulizer solution 2.5 mg  2.5 mg Nebulization Q4H PRN Raynelle Bring, MD  2.5 mg at 09/17/18 0315  . amoxicillin (AMOXIL) capsule 500 mg  500 mg Oral Q8H Raynelle Bring, MD   500 mg at 09/17/18 1315  . bisacodyl (DULCOLAX) suppository 10 mg  10 mg Rectal Daily PRN Raynelle Bring, MD      . carvedilol (COREG) tablet 3.125 mg  3.125 mg Oral BID WC Raynelle Bring, MD   3.125 mg at 09/17/18 0736  . HYDROmorphone (DILAUDID) injection 0.5 mg  0.5 mg Intravenous Q3H PRN Raynelle Bring, MD   0.5 mg at 09/16/18 0601  . ipratropium-albuterol (DUONEB) 0.5-2.5 (3) MG/3ML nebulizer solution 3 mL  3 mL Nebulization  BID Raynelle Bring, MD   3 mL at 09/17/18 0749  . irbesartan (AVAPRO) tablet 300 mg  300 mg Oral Daily Raynelle Bring, MD   300 mg at 09/17/18 0931  . lidocaine (LIDODERM) 5 % 1 patch  1 patch Transdermal Q24H Raynelle Bring, MD   1 patch at 09/17/18 1234  . magnesium citrate solution 1 Bottle  1 Bottle Oral PRN Raynelle Bring, MD      . methocarbamol (ROBAXIN) tablet 500 mg  500 mg Oral BID Raynelle Bring, MD   500 mg at 09/17/18 0931  . mometasone-formoterol (DULERA) 200-5 MCG/ACT inhaler 2 puff  2 puff Inhalation BID Raynelle Bring, MD   2 puff at 09/17/18 0749  . mupirocin ointment (BACTROBAN) 2 % 1 application  1 application Nasal BID Raynelle Bring, MD   1 application at 27/07/86 0930  . ondansetron (ZOFRAN) tablet 4 mg  4 mg Oral Q6H PRN Raynelle Bring, MD       Or  . ondansetron [facility]) injection 4 mg  4 mg Intravenous Q6H PRN Raynelle Bring, MD      . oxyCODONE (Oxy IR/ROXICODONE) immediate release tablet 5 mg  5 mg Oral Q3H PRN Raynelle Bring, MD   5 mg at 09/17/18 1316  . oxyCODONE (OXYCONTIN) 12 hr tablet 15 mg  15 mg Oral Q12H Raynelle Bring, MD   15 mg at 09/17/18 0931  . polyethylene glycol (MIRALAX / GLYCOLAX) packet 17 g  17 g Oral BID Samuella Cota, MD   17 g at 09/17/18 1315  . predniSONE (DELTASONE) tablet 5 mg  5 mg Oral BID WC Raynelle Bring, MD   5 mg at 09/17/18 0736  . senna-docusate (Senokot-S) tablet 1 tablet  1 tablet Oral BID Raynelle Bring, MD   1 tablet at 09/17/18 0930     Discharge Medications: Please see discharge summary for a list of discharge medications.  Relevant Imaging Results:  Relevant Lab Results:   Additional Information SS# [id number]  Nila Nephew, LCSW

## 2018-09-17 NOTE — Progress Notes (Signed)
Pt with increase urge to void and increase pain with urination attempt. No output this shift. On call NP, B[place], notified. I&O cath performed yielding 250 total output. Pt reports feeling much more comfortable. Pt now resting. Will continue to monitor

## 2018-09-17 NOTE — Progress Notes (Addendum)
PROGRESS NOTE  OCTAVIS SHEELER WUJ:[id number] DOB: [birth year] DOA: 09/12/2018 PCP: [name], MD  Dr. Alen Blew  Brief History   63 year old man PMH castration resistant prostate cancer, multiple compression fractures causing chronic pain, history of kyphoplasty T10, T12, L1, L2, recently started on MS Contin for chronic pain, however developed hallucinations and sedation and was sent to the hospital for further evaluation for acute encephalopathy.  A & P  Acute encephalopathy, delirium secondary to adverse reaction to MS Contin, complicated by chronic back pain.  UTI was questioned on admission. Patient never had any urinary symptoms.  His symptoms have significantly improved even though urine shows enterococcus and he has been treated with ceftriaxone, arguing against infection.  In fact his history is most suggestive of adverse reaction to MS Contin. --Appears to wax and wane, was confused this morning but after seeing palliative care later in the day was oriented. --Expect to wax and wane, slowly resolved.  Continue pain regimen as per palliative medicine.  Bacteriuria --Complete 5-day course of amoxicillin as per Dr. Alinda Money.  New vertebral compression fractures superimposed on chronic back pain secondary to compression fracture T10, T12.  Status post kyphoplasty with bone biopsy which showed no malignancy. MS Contin stopped, started on OxyContin given previous tolerance for oxycodone.  Appreciate palliative medicine adjusting medications for pain control.  Overall improving. --Pain seems to be well controlled now.  Can follow-up with interventional radiology as an outpatient for consideration of kyphoplasty  Low-grade urothelial carcinoma.  Status post  transurethral resection of bladder tumor 2/20 with Dr. Alinda Money --Outpatient follow-up with Dr. 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   --Currently on Zytiga daily with  prednisone 5 mg daily.    Overall appears to be stabilizing.  Pursuing SNF.  Anticipate stability for discharge 2/23.  DVT prophylaxis: SCDs Code Status: Full Family Communication: none Disposition Plan: SNF   [name], MD  [facility] Direct contact: see [email or web address]  7PM-7AM contact night coverage as above 09/17/2018, 12:31 PM  LOS: 4 days   Consultants  . Interventional radiology . Urology  Procedures  1.  Cystoscopy 2.  Pelvic exam under anesthesia 3.  Transurethral resection of bladder tumor (1.2 cm) 4.  Postoperative instillation of intravesical gemcitabine chemotherapy  Antibiotics  .   Interval History/Subjective  Discussed with RN this a.m., patient has been confused this a.m., hallucinating, seeing a man in the room. Patient reports feeling okay, pain seems to be controlled.  Objective   Vitals:  Vitals:   09/17/18 0626 09/17/18 0750  BP: (!) 159/86   Pulse: 97   Resp: 20   Temp: 98.7 F (37.1 C)   SpO2: 99% 97%    Exam:  Constitutional:   . Appears mildly anxious, comfortable, confused Eyes:  . pupils and irises appear normal ENMT:  . grossly normal hearing  Respiratory:  . CTA bilaterally, no w/r/r.  . Respiratory effort normal.  Cardiovascular:  . RRR, no m/r/g . No LE extremity edema   Musculoskeletal:  . RLE, LLE   . strength and tone grossly normal.  No apparent pain with movement bilateral legs. Psychiatric:  . Mental status o Mood appears appropriate, affect slightly odd . Oriented to self, disoriented to place, month, year   I have personally reviewed the following:   Today's Data  . Creatinine stable 1.57  Lab Data  . Noted  Micro Data  . Urine culture with enterococcus, sensitivities noted  Imaging  .  Chest x-ray no acute disease. . CT head no acute disease. . CTA chest no PE. . CT abdomen pelvis showed new compression fractures at T8, T9, L3  Cardiology Data  .   Other Data  . Surgical  pathology noted, low-grade urothelial carcinoma  Scheduled Meds: . abiraterone acetate  1,000 mg Oral q1800  . amoxicillin  500 mg Oral Q8H  . carvedilol  3.125 mg Oral BID WC  . ipratropium-albuterol  3 mL Nebulization BID  . irbesartan  300 mg Oral Daily  . lidocaine  1 patch Transdermal Q24H  . methocarbamol  500 mg Oral BID  . mometasone-formoterol  2 puff Inhalation BID  . mupirocin ointment  1 application Nasal BID  . oxyCODONE  15 mg Oral Q12H  . predniSONE  5 mg Oral BID WC  . senna-docusate  1 tablet Oral BID   Continuous Infusions: . sodium chloride 10 mL/hr (09/15/18 0002)    Principal Problem:   Delirium Active Problems:   Essential hypertension   History of prostate cancer   Secondary malignant neoplasm of bone and bone marrow (HCC)   Pathologic compression fracture of spine, initial encounter (HCC)   Acute lower UTI   Cancer associated pain   Bladder cancer ([place])   Acute encephalopathy   Palliative care by specialist   Pain management   LOS: 4 days

## 2018-09-17 NOTE — Progress Notes (Signed)
Patient and wife state he has been trying to void for hours and has not been able to.  This RN walked patient to bathroom earlier in shift and he voided at that time, wife states he has not voided since she arrived around 11 a.m.  Bladder scan read at 268.  Notified Dr. Karie Georges, order for in and out cath obtained, leave catheter in if greater than 400 ccs urine returned.

## 2018-09-17 NOTE — Evaluation (Signed)
Occupational Therapy Evaluation Patient Details Name: [name] Holder MRN: [id number] DOB: [birth year] Today's Date: 09/17/2018    History of Present Illness 63 yo male admitted to ED on 2/17 for medication-associated delirium. Pt with metastatic prostate cancer dx in 2014 with mets to bone/spine. head CT - for acute findings, CT abdomen/pelvis reveals new pathologic compression fractures of T8, T9, L3. Pt with history of kyphoplasty for pathologic compression fractures T10,T12, L1,L2 08/26/18. Imaging of chest reveals atelectasis of L mid to lower lung and air distention of small bowel. Pt s/p transurethral resection of bladder tumor 09/15/18. Other PMH includes chronic LBP, chronic pain, PNA, DOE, osteopenia.    Clinical Impression   Pt admitted with the abover Pt currently with functional limitations due to the deficits listed below (see OT Problem List).  Pt will benefit from skilled OT to increase their safety and independence with ADL and functional mobility for ADL to facilitate discharge to venue listed below.      Follow Up Recommendations  SNF;CIR;Supervision/Assistance - 24 hour    Equipment Recommendations  3 in 1 bedside commode    Recommendations for Other Services       Precautions / Restrictions Precautions Precautions: Fall Precaution Comments: Pt with pathlogic vertebral compression fractures T8, T9, L3 with recent kyphoplasty for T10, T12, L1, L2. Log roll technique utilized for protection of spine, no orders for brace.  Restrictions Weight Bearing Restrictions: No      Mobility Bed Mobility Overal bed mobility: Needs Assistance Bed Mobility: Supine to Sit;Sit to Supine     Supine to sit: Min assist;HOB elevated Sit to supine: Mod assist;HOB elevated   General bed mobility comments: Min assist for supine to sit for sequencing with log roll technique for protection of back, physical assist for elevating trunk and lowering legs simultaneously. Mod assist for sit  to supine for trunk lowering and LE lifting into bed, use of boost function on bed to   Transfers Overall transfer level: Needs assistance Equipment used: Rolling walker (2 wheeled) Transfers: Sit to/from Stand Sit to Stand: Min assist;From elevated surface         General transfer comment: Min assist for power up, steadying. When moving from stand to sit, pt attempting to sit before close enough to the bed, did not bring RW back with him. Multimodal cues for safety.     Balance Overall balance assessment: Needs assistance Sitting-balance support: Feet supported Sitting balance-Leahy Scale: Good     Standing balance support: Bilateral upper extremity supported Standing balance-Leahy Scale: Poor Standing balance comment: relies on RW and OT for steadying                            ADL either performed or assessed with clinical judgement   ADL Overall ADL's : Needs assistance/impaired Eating/Feeding: Set up;Sitting   Grooming: Minimal assistance;Sitting   Upper Body Bathing: Minimal assistance;Sitting   Lower Body Bathing: Maximal assistance;Sit to/from stand;Cueing for back precautions;Cueing for sequencing;Cueing for safety   Upper Body Dressing : Minimal assistance;Sitting   Lower Body Dressing: Maximal assistance;Sit to/from stand;Cueing for safety;Cueing for sequencing;Cueing for back precautions   Toilet Transfer: Minimal assistance;RW;Comfort height toilet;Ambulation;Cueing for safety   Toileting- Clothing Manipulation and Hygiene: Minimal assistance;Sit to/from stand;Cueing for sequencing;Cueing for compensatory techniques;Cueing for safety;Cueing for back precautions               Vision Patient Visual Report: No change from baseline  Perception     Praxis      Pertinent Vitals/Pain Pain Assessment: Faces Faces Pain Scale: Hurts whole lot Pain Location: back  Pain Descriptors / Indicators: Sore;Constant Pain Intervention(s):  Monitored during session;Limited activity within patient's tolerance;Repositioned;Premedicated before session     Hand Dominance Right   Extremity/Trunk Assessment         Cervical / Trunk Assessment Cervical / Trunk Assessment: Normal   Communication Communication Communication: No difficulties   Cognition Arousal/Alertness: Awake/alert Behavior During Therapy: Restless;Impulsive Overall Cognitive Status: Impaired/Different from baseline Area of Impairment: Attention;Memory;Following commands;Safety/judgement;Problem solving                   Current Attention Level: Selective Memory: Decreased short-term memory Following Commands: Follows one step commands with increased time Safety/Judgement: Decreased awareness of deficits;Decreased awareness of safety   Problem Solving: Difficulty sequencing;Requires verbal cues;Requires tactile cues General Comments: Pt admitted with hallucinations, pt reports still having hallucinations but knows they are not real. Pt attempts to move quickly and lacks safety awareness with mobility, requiring max verbal and tactile cuing for safety.    General Comments       Exercises     Shoulder Instructions      Home Living Family/patient expects to be discharged to:: Private residence Living Arrangements: Spouse/significant other Available Help at Discharge: Family Type of Home: House Home Access: Stairs to enter Technical brewer of Steps: 3 Entrance Stairs-Rails: None Home Layout: One level               Home Equipment: Environmental consultant - 2 wheels;Hospital bed;Bedside commode          Prior Functioning/Environment Level of Independence: Needs assistance  Gait / Transfers Assistance Needed: Pt very limited in ambulation over the past 5-6 weeks, and has BSC next to bed to limit ambulation. PT has been in excruciating back pain which has limited mobiity. Pt uses RW to get to steps to leave house for appointments, otherwise pt  does not leave the house at this time. Pt with many "close falls"  ADL's / Homemaking Assistance Needed: Wife assists pt with ADLs, and per wife pt is with pt most of the time and if she leaves him for ~2 hours, she is very worried about him on his own.    Comments: Pt does not drive given medications         OT Problem List: Decreased strength;Decreased activity tolerance;Impaired balance (sitting and/or standing);Decreased safety awareness;Decreased knowledge of use of DME or AE;Decreased knowledge of precautions;Decreased coordination      OT Treatment/Interventions: Self-care/ADL training;DME and/or AE instruction;Patient/family education    OT Goals(Current goals can be found in the care plan section) Acute Rehab OT Goals Patient Stated Goal: go home to wife and dogs   OT Frequency: Min 2X/week   Barriers to D/C:            Co-evaluation              AM-PAC OT "6 Clicks" Daily Activity     Outcome Measure Help from another person eating meals?: None Help from another person taking care of personal grooming?: A Little Help from another person toileting, which includes using toliet, bedpan, or urinal?: A Lot Help from another person bathing (including washing, rinsing, drying)?: A Lot Help from another person to put on and taking off regular upper body clothing?: A Little Help from another person to put on and taking off regular lower body clothing?: A Lot 6 Click Score:  16   End of Session Equipment Utilized During Treatment: Rolling walker;Gait belt Nurse Communication: Mobility status  Activity Tolerance: Patient limited by pain Patient left: in chair;with call bell/phone within reach;with family/visitor present  OT Visit Diagnosis: Unsteadiness on feet (R26.81);Other abnormalities of gait and mobility (R26.89);Muscle weakness (generalized) (M62.81);History of falling (Z91.81);Pain                Time: 1325-1408 OT Time Calculation (min): 43 min Charges:  OT  General Charges $OT Visit: 1 Visit OT Evaluation $OT Eval Moderate Complexity: 1 Mod OT Treatments $Self Care/Home Management : 8-22 mins  Kari Baars, OT Acute Rehabilitation Services [phone] Office- [place], Edwena Felty D 09/17/2018, 5:50 PM

## 2018-09-18 MED ORDER — TAMSULOSIN HCL 0.4 MG PO CAPS
0.4000 mg | ORAL_CAPSULE | Freq: Every day | ORAL | Status: DC
  Administered 2018-09-18 – 2018-09-22 (×5): 0.4 mg via ORAL
  Filled 2018-09-18 (×5): qty 1

## 2018-09-18 MED ORDER — GABAPENTIN 300 MG PO CAPS
300.0000 mg | ORAL_CAPSULE | Freq: Every day | ORAL | Status: DC
  Administered 2018-09-18 – 2018-09-21 (×4): 300 mg via ORAL
  Filled 2018-09-18 (×4): qty 1

## 2018-09-18 MED ORDER — FENTANYL 25 MCG/HR TD PT72: 1.0000 | MEDICATED_PATCH | TRANSDERMAL | Status: DC

## 2018-09-18 MED ORDER — PHENAZOPYRIDINE HCL 200 MG PO TABS
200.0000 mg | ORAL_TABLET | Freq: Three times a day (TID) | ORAL | Status: DC
  Administered 2018-09-18 – 2018-09-22 (×13): 200 mg via ORAL
  Filled 2018-09-18 (×15): qty 1

## 2018-09-18 MED ORDER — OXYCODONE HCL ER 15 MG PO T12A
15.0000 mg | EXTENDED_RELEASE_TABLET | Freq: Two times a day (BID) | ORAL | Status: DC
  Administered 2018-09-18 – 2018-09-19 (×2): 15 mg via ORAL
  Filled 2018-09-18 (×2): qty 1

## 2018-09-18 MED ORDER — QUETIAPINE FUMARATE 25 MG PO TABS
25.0000 mg | ORAL_TABLET | Freq: Every day | ORAL | Status: DC
  Administered 2018-09-18 – 2018-09-21 (×4): 25 mg via ORAL
  Filled 2018-09-18 (×4): qty 1

## 2018-09-18 NOTE — Progress Notes (Signed)
Occupational Therapy Treatment Patient Details Name: [name] Holder MRN: [id number] DOB: 04-Sep-1955 Today's Date: 09/18/2018    History of present illness 63 yo male admitted to ED on 2/17 for medication-associated delirium. Pt with metastatic prostate cancer dx in 2014 with mets to bone/spine. head CT - for acute findings, CT abdomen/pelvis reveals new pathologic compression fractures of T8, T9, L3. Pt with history of kyphoplasty for pathologic compression fractures T10,T12, L1,L2 08/26/18. Imaging of chest reveals atelectasis of L mid to lower lung and air distention of small bowel. Pt s/p transurethral resection of bladder tumor 09/15/18. Other PMH includes chronic LBP, chronic pain, PNA, DOE, osteopenia.    OT comments  pts urgency affecting his ability to focus on OT session. RN aware  Follow Up Recommendations  SNF;CIR;Supervision/Assistance - 24 hour    Equipment Recommendations  3 in 1 bedside commode    Recommendations for Other Services      Precautions / Restrictions Precautions Precautions: Fall Precaution Comments: Pt with pathlogic vertebral compression fractures T8, T9, L3 with recent kyphoplasty for T10, T12, L1, L2. Log roll technique utilized for protection of spine, no orders for brace.  Restrictions Weight Bearing Restrictions: No       Mobility Bed Mobility Overal bed mobility: Needs Assistance Bed Mobility: Supine to Sit;Sit to Supine     Supine to sit: Min assist;HOB elevated Sit to supine: Mod assist;HOB elevated   General bed mobility comments: Min assist for supine to sit for sequencing with log roll technique for protection of back, physical assist for elevating trunk and lowering legs simultaneously. Mod assist for sit to supine for trunk lowering and LE lifting into bed, use of boost function on bed to   Transfers Overall transfer level: Needs assistance Equipment used: Rolling walker (2 wheeled) Transfers: Sit to/from Stand Sit to Stand: Min  assist;From elevated surface              Balance Overall balance assessment: Needs assistance Sitting-balance support: Feet supported Sitting balance-Leahy Scale: Good     Standing balance support: Bilateral upper extremity supported Standing balance-Leahy Scale: Poor Standing balance comment: relies on RW and OT for steadying                            ADL either performed or assessed with clinical judgement   ADL                   Upper Body Dressing : Set up;Sitting   Lower Body Dressing: Moderate assistance;Sit to/from stand;Cueing for safety;Cueing for sequencing;Cueing for compensatory techniques   Toilet Transfer: Minimal assistance;RW;Comfort height toilet;Ambulation;Cueing for safety Toilet Transfer Details (indicate cue type and reason): MAX Vc for safety Toileting- Clothing Manipulation and Hygiene: Minimal assistance;Sit to/from stand;Cueing for sequencing;Cueing for compensatory techniques;Cueing for safety;Cueing for back precautions         General ADL Comments: Pt having urgency with bladder. RN aware and coming to do bladder scan at end of OT session     Vision Patient Visual Report: No change from baseline     Perception     Praxis      Cognition Arousal/Alertness: Awake/alert Behavior During Therapy: Impulsive Overall Cognitive Status: Impaired/Different from baseline Area of Impairment: Attention;Memory;Following commands;Safety/judgement;Problem solving                   Current Attention Level: Selective Memory: Decreased short-term memory Following Commands: Follows one step commands with increased time Safety/Judgement:  Decreased awareness of deficits;Decreased awareness of safety   Problem Solving: Difficulty sequencing;Requires verbal cues;Requires tactile cues          Exercises     Shoulder Instructions       General Comments      Pertinent Vitals/ Pain       Pain Assessment: 0-10 Pain Score:  3  Pain Location: back  Pain Descriptors / Indicators: Sore Pain Intervention(s): Limited activity within patient's tolerance;Repositioned  Home Living                                          Prior Functioning/Environment              Frequency  Min 2X/week        Progress Toward Goals  OT Goals(current goals can now be found in the care plan section)  Progress towards OT goals: Progressing toward goals     Plan Discharge plan remains appropriate    Co-evaluation                 AM-PAC OT "6 Clicks" Daily Activity     Outcome Measure   Help from another person eating meals?: None Help from another person taking care of personal grooming?: A Little Help from another person toileting, which includes using toliet, bedpan, or urinal?: A Lot Help from another person bathing (including washing, rinsing, drying)?: A Lot Help from another person to put on and taking off regular upper body clothing?: A Little Help from another person to put on and taking off regular lower body clothing?: A Lot 6 Click Score: 16    End of Session Equipment Utilized During Treatment: Rolling walker;Gait belt  OT Visit Diagnosis: Unsteadiness on feet (R26.81);Other abnormalities of gait and mobility (R26.89);Muscle weakness (generalized) (M62.81);History of falling (Z91.81);Pain   Activity Tolerance Other (comment)(limited by bladder urgency)   Patient Left in chair;with call bell/phone within reach;with family/visitor present   Nurse Communication Mobility status        Time: 3532-9924 OT Time Calculation (min): 14 min  Charges: OT General Charges $OT Visit: 1 Visit OT Treatments $Self Care/Home Management : 8-22 mins  Kari Baars, [place] [phone] Office- [phone], Edwena Felty D 09/18/2018, 5:47 PM

## 2018-09-18 NOTE — Plan of Care (Signed)
  Problem: Clinical Measurements: Goal: Ability to maintain clinical measurements within normal limits will improve Outcome: Progressing Goal: Will remain free from infection Outcome: Progressing Goal: Diagnostic test results will improve Outcome: Progressing Goal: Respiratory complications will improve Outcome: Progressing Goal: Cardiovascular complication will be avoided Outcome: Progressing   Problem: Elimination: Goal: Will not experience complications related to urinary retention Outcome: Progressing   Problem: Pain Managment: Goal: General experience of comfort will improve Outcome: Progressing   Problem: Safety: Goal: Ability to remain free from injury will improve Outcome: Progressing   Problem: Skin Integrity: Goal: Risk for impaired skin integrity will decrease Outcome: Progressing   

## 2018-09-18 NOTE — Progress Notes (Signed)
PMT progress note  Patient seen today, his sister who is an Therapist, sports and his brother in law who is an MD are at the bedside, visiting from [place], [place].   The patient was more confused and agitated overnight.   He isn't eating much. He did have a bowel movement.   Chart reviewed.   BP 134/85 (BP Location: Left Wrist)   Pulse 95   Temp 98.6 F (37 C) (Oral)   Resp 16   Ht 5\' 7"  (1.702 m)   Wt 78.4 kg   SpO2 97%   BMI 27.08 kg/m  Labs and imaging noted Med history, including PRN pain medication use noted.   Awakens,  Some what alert Regular S1 S 2 Abdomen is soft No edema  PPS 30%  metastatic prostate cancer   back pain and imaging has suggested new compression fractures of the thoracic T8, T9 and lumbar L3 spine Continue current pain regimen and monitor. Will add PO Gabapentin.   Agree with Seroquel HS.   Discussed with [facility] MD Dr Sarajane Jews.   MRI brain to be done.   Brother in law concerned about the patient's mental status, and that he is probably not able to participate in rehab at [facility].   Disposition to be addressed after acute events subside.   Urology following.   Called and updated wife on the phone.    35 minutes spent. Loistine Chance MD [place] palliative medicine team [phone]

## 2018-09-18 NOTE — Progress Notes (Signed)
Patient yelling "help me".  Says he needs a pulmonologist for his lungs.  I told patient his lungs are clear to auscultation, oxygen level 100% on room air.  No coughing, no respiratory distress. Patient is very fixated on his inhaler, constantly asking for his inhaler from home.  Informed patient that he will be receiving an inhaler and neb this morning from respiratory but there is no indication for a prn treatment at this time.

## 2018-09-18 NOTE — Progress Notes (Signed)
Patient ID: ALANDIS BLUEMEL, male   DOB: 10/21/1955, 63 y.o.   MRN: [id number] 3 Days Post-Op Subjective: Called yesterday due to patient feeling he had to void but unable to do so.  Bladder scan was intermediate but not in obvious retention.  Catheterized a couple of times with between 200-350 cc of urine.  Complains of urinary frequency and dysuria this morning.  Objective: Vital signs in last 24 hours: Temp:  [97.8 F (36.6 C)-99.6 F (37.6 C)] 97.8 F (36.6 C) (02/23 0550) Pulse Rate:  [102-109] 102 (02/23 0550) Resp:  [16-20] 18 (02/23 0550) BP: (136-191)/(72-109) 138/86 (02/23 0550) SpO2:  [98 %-100 %] 98 % (02/23 0906)  Intake/Output from previous day: 02/22 0701 - 02/23 0700 In: 540 [P.O.:540] Out: 660 [Urine:660] Intake/Output this shift: No intake/output data recorded.  Physical Exam:  General: Alert and oriented Abd: Soft  Lab Results: No results for input(s): HGB, HCT in the last 72 hours. BMET Recent Labs    09/17/18 0455  CREATININE 0.57*     Studies/Results: No results found.  Assessment/Plan: 1) Bladder cancer: Reviewed path yesterday.  F/U as scheduled. 2) CRPC: Continue current treatment.  Follow up as scheduled. 3) Dysuria/frequency:  He does not have urinary retention.  Symptoms likely due to recent TURBT.  Will start tamsulosin and phenazopyridine for symptomatic relief.  Nursing staff given parameters on when to catheterize.    LOS: 5 days   Dutch Gray 09/18/2018, 9:18 AM

## 2018-09-18 NOTE — Progress Notes (Signed)
Patient is complaining of severe urgency, voids small amounts 20-30 ccs at a time.  Patient urinated approximately 20 ccs with this RN in the room, did an immediate bladder scan afterward which showed 107 ccs in bladder.  Patient states it burns to urinate.  Urine clear.  Will speak with urology regarding above.

## 2018-09-18 NOTE — Progress Notes (Addendum)
PROGRESS NOTE  [name] Holder:[id number] DOB: [birth year] DOA: 09/12/2018 PCP: [name], MD  Dr. Alen Blew  Brief History   63 year old man PMH castration resistant prostate cancer, multiple compression fractures causing chronic pain, history of kyphoplasty T10, T12, L1, L2, recently started on MS Contin for chronic pain, however developed hallucinations and sedation and was sent to the hospital for further evaluation for acute encephalopathy.  A & P  Acute encephalopathy, delirium secondary to adverse reaction to MS Contin, complicated by chronic back pain.  UTI was questioned on admission. Patient never had any urinary symptoms.  His symptoms have significantly improved even though urine shows enterococcus and he has been treated with ceftriaxone, arguing against infection.  In fact his history is most suggestive of adverse reaction to MS Contin. TSH WNL. --Waxes and wanes consistent with delirium.  Sister at bedside tells me he could not tolerate fentanyl patch secondary to asthma problems. --Exam is nonfocal, suspect prolonged delirium secondary to MS Contin.  May be contributed to by narcotics, difficult case given the patient's substantial pain from multiple vertebral fractures. --check ammonia, B-12, MRI brain  Bacteriuria --Complete 5-day course of amoxicillin as per Dr. Alinda Money.  New vertebral compression fractures superimposed on chronic back pain secondary to compression fracture T10, T12.  Status post kyphoplasty with bone biopsy which showed no malignancy. MS Contin stopped, started on OxyContin given previous tolerance for oxycodone.  Appreciate palliative medicine adjusting medications for pain control.  Overall improving. --For the most part pain seems to be well controlled can follow-up with interventional radiology as an outpatient for consideration of kyphoplasty  Low-grade urothelial carcinoma.  Status post  transurethral resection of bladder tumor 2/20 with Dr.  Alinda Money --Outpatient follow-up with Dr. 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   --Currently on Zytiga daily with prednisone 5 mg daily.    Mentation waxes and wanes, seems to be worse at night, pain does appear to be better controlled.  Trial low-dose Seroquel at night.  Limited options for treating pain.  Appreciate palliative medicine involvement.  Exam remains nonfocal.  Given ongoing encephalopathy will obtain MRI to exclude stroke, gross lesions.  DVT prophylaxis: SCDs Code Status: Full Family Communication: Sister and brother-in-law at bedside Disposition Plan: SNF vd CIR   [name], MD  [facility] Direct contact: see [email or web address]  7PM-7AM contact night coverage as above 09/18/2018, 1:04 PM  LOS: 5 days   Consultants  . Interventional radiology . Urology  Procedures  1.  Cystoscopy 2.  Pelvic exam under anesthesia 3.  Transurethral resection of bladder tumor (1.2 cm) 4.  Postoperative instillation of intravesical gemcitabine chemotherapy  Antibiotics  .   Interval History/Subjective  Quite confused last night per RN, required security.  Some confusion this morning as well with complaints of shortness of breath. Today the patient reports pain is well controlled, still having some hallucinations.  First bowel movement in a week.  Objective   Vitals:  Vitals:   09/18/18 0906 09/18/18 1226  BP:  134/85  Pulse:  95  Resp:  16  Temp:  98.6 F (37 C)  SpO2: 98% 97%    Exam: Constitutional:   . Appears calm and comfortable Eyes:  . pupils and irises appear normal ENMT:  . grossly normal hearing  . Lips appear normal Respiratory:  . CTA bilaterally, no w/r/r.  . Respiratory effort normal.  Cardiovascular:  . RRR, no m/r/g . No LE extremity edema  Musculoskeletal:  . RUE, LUE, RLE, LLE   . strength and tone grossly normal Skin:  . No rashes, lesions, ulcers noted Neurologic:   . Grossly nonfocal Psychiatric:  . Mental status o Mood, affect appropriate o Orientation to self, [facility] year   I have personally reviewed the following:   Today's Data  . No new data  Lab Data  . Noted  Micro Data  . Urine culture with enterococcus, sensitivities noted  Imaging  . Chest x-ray no acute disease. . CT head no acute disease. . CTA chest no PE. . CT abdomen pelvis showed new compression fractures at T8, T9, L3  Cardiology Data  .   Other Data  . Surgical pathology noted, low-grade urothelial carcinoma  Scheduled Meds: . abiraterone acetate  1,000 mg Oral q1800  . amoxicillin  500 mg Oral Q8H  . carvedilol  3.125 mg Oral BID WC  . diclofenac sodium  4 g Topical QID  . ipratropium-albuterol  3 mL Nebulization BID  . irbesartan  300 mg Oral Daily  . lidocaine  1 patch Transdermal Q24H  . methocarbamol  500 mg Oral BID  . mometasone-formoterol  2 puff Inhalation BID  . mupirocin ointment  1 application Nasal BID  . oxyCODONE  15 mg Oral Q12H  . phenazopyridine  200 mg Oral TID WC  . polyethylene glycol  17 g Oral BID  . predniSONE  5 mg Oral BID WC  . QUEtiapine  25 mg Oral QHS  . senna-docusate  1 tablet Oral BID  . tamsulosin  0.4 mg Oral Daily   Continuous Infusions: . sodium chloride 10 mL/hr (09/15/18 0002)    Principal Problem:   Delirium Active Problems:   Essential hypertension   History of prostate cancer   Secondary malignant neoplasm of bone and bone marrow (HCC)   Pathologic compression fracture of spine, initial encounter (HCC)   Acute lower UTI   Cancer associated pain   Bladder cancer ([place])   Acute encephalopathy   Palliative care by specialist   Pain management   LOS: 5 days

## 2018-09-18 NOTE — Plan of Care (Signed)
Patient medicated for pain x 1 this shift only (in addition to long acting pain meds).  Patient more mobile this shift (up and down to bathroom multiple times with walker), did eat one meal at lunch, drinking adequate fluids.  Patient continues to have frequency and urgency, some dribbling and incontinence of urine at times.  Largest bladder scan volume read as 214 on 7 a to 7 p shift.  Wife and sister at bedside.

## 2018-09-19 ENCOUNTER — Inpatient Hospital Stay (HOSPITAL_COMMUNITY): Payer: BLUE CROSS/BLUE SHIELD

## 2018-09-19 LAB — COMPREHENSIVE METABOLIC PANEL
ALT: 67 U/L — ABNORMAL HIGH (ref 0–44)
AST: 45 U/L — ABNORMAL HIGH (ref 15–41)
Albumin: 3.3 g/dL — ABNORMAL LOW (ref 3.5–5.0)
Alkaline Phosphatase: 107 U/L (ref 38–126)
Anion gap: 8 (ref 5–15)
BUN: 11 mg/dL (ref 8–23)
CO2: 23 mmol/L (ref 22–32)
Calcium: 8.9 mg/dL (ref 8.9–10.3)
Chloride: 107 mmol/L (ref 98–111)
Creatinine, Ser: 0.65 mg/dL (ref 0.61–1.24)
GFR calc Af Amer: >60 mL/min (ref >60)
GFR calc non Af Amer: >60 mL/min (ref >60)
Glucose, Bld: 98 mg/dL (ref 70–99)
Potassium: 3.1 mmol/L — ABNORMAL LOW (ref 3.5–5.1)
Sodium: 138 mmol/L (ref 135–145)
Total Bilirubin: 1.1 mg/dL (ref 0.3–1.2)
Total Protein: 5.7 g/dL — ABNORMAL LOW (ref 6.5–8.1)

## 2018-09-19 LAB — AMMONIA: Ammonia: 14 umol/L (ref 9–35)

## 2018-09-19 LAB — VITAMIN B12: Vitamin B-12: 737 pg/mL (ref 180–914)

## 2018-09-19 MED ORDER — POTASSIUM CHLORIDE CRYS ER 20 MEQ PO TBCR
40.0000 meq | EXTENDED_RELEASE_TABLET | ORAL | Status: AC
  Administered 2018-09-19 (×2): 40 meq via ORAL
  Filled 2018-09-19 (×2): qty 2

## 2018-09-19 NOTE — Progress Notes (Signed)
Inpatient Rehabilitation Admissions Coordinator  Inpatient acute rehab consult received I reviewed chart and then contacted patient's wife by phone. She does not fell pt at a level to tolerate the intensity of an inpt rehab admit and wishes to pursue SNF. I have alerted RN CM. We will sign off at this time.  Danne Baxter, RN, MSN Rehab Admissions Coordinator [phone] 09/19/2018 11:32 AM

## 2018-09-19 NOTE — Progress Notes (Signed)
PROGRESS NOTE  [name] Holder CVE:[id number] DOB: January 15, 1956 DOA: 09/12/2018 PCP: [name] Lima, MD  Dr. Alen Holder  Brief History   63 year old man PMH castration resistant prostate cancer, multiple compression fractures causing chronic pain, history of kyphoplasty T10, T12, L1, L2, recently started on MS Contin for chronic pain, however developed hallucinations and sedation and was sent to the hospital for further evaluation for acute encephalopathy.  A & P  Acute encephalopathy, delirium secondary to adverse reaction to MS Contin, complicated by chronic back pain.  UTI was questioned on admission.  Patient did not have urinary symptoms.  History most suggestive of adverse reaction to MS Contin.  TSH, SERUM AMMONIA WNL.  CT head negative --Continues to display mild confusion and disorientation which tends to worsen in the evening.  Most likely subacute process related scone sister reported the patient could not tolerate fentanyl patch in the past. --MRI brain pending.  Other work-up unremarkable.  Continue quetiapine at night.  Bacteriuria --Complete 5-day course of amoxicillin as per Dr. Alinda Holder.  New vertebral compression fractures superimposed on chronic back pain secondary to compression fracture T10, T12.  Status post kyphoplasty with bone biopsy which showed no malignancy. MS Contin stopped, started on OxyContin given previous tolerance for oxycodone.  Appreciate palliative medicine adjusting medications for pain control.  Overall improving. --Pain fairly well controlled.  Appreciate palliative medicine involvement.  Continue short acting oxycodone, gabapentin.  Hold OxyContin.  Place TLSO brace.  Low-grade urothelial carcinoma.  Status post  transurethral resection of bladder tumor 2/20 with Dr. Alinda Holder --Outpatient follow-up with Dr. 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   --Currently on Zytiga daily with prednisone 5 mg daily.     Mentation somewhat better today.  Plans as above.  MRI brain pending.  SNF soon if continues to improve.  DVT prophylaxis: SCDs Code Status: Full Family Communication: None Disposition Plan: SNF    [name] Hodgkins, MD  [facility] Direct contact: see [email or web address]  7PM-7AM contact night coverage as above 09/19/2018, 1:25 PM  LOS: 6 days   Consultants  . Interventional radiology . Urology  Procedures  1.  Cystoscopy 2.  Pelvic exam under anesthesia 3.  Transurethral resection of bladder tumor (1.2 cm) 4.  Postoperative instillation of intravesical gemcitabine chemotherapy  Antibiotics  .   Interval History/Subjective  Issues with urinary urgency last night.  Today feels better.  Back pain seems to be controlled.  Objective   Vitals:  Vitals:   09/19/18 0658 09/19/18 0744  BP: (!) 177/99   Pulse: 96   Resp: 18   Temp: 97.6 F (36.4 C)   SpO2: 100% 93%    Exam: Constitutional:   . Appears calm and comfortable Eyes:  . pupils and irises appear normal ENMT:  . grossly normal hearing  Respiratory:  . CTA bilaterally, no w/r/r.  . Respiratory effort normal.  Cardiovascular:  . RRR, no m/r/g . No LE extremity edema   Musculoskeletal:  . RLE, LLE   . Moves both legs to command Psychiatric:  . Mental status o Mood, affect appropriate . Oriented to person, [name], not month or time of the year   I have personally reviewed the following:   Today's Data  . No new data  Lab Data  . Noted  Micro Data  . Urine culture with enterococcus, sensitivities noted  Imaging  . Chest x-ray no acute disease. . CT head no acute disease. . CTA chest  no PE. . CT abdomen pelvis showed new compression fractures at T8, T9, L3  Cardiology Data  .   Other Data  . Surgical pathology noted, low-grade urothelial carcinoma  Scheduled Meds: . abiraterone acetate  1,000 mg Oral q1800  . amoxicillin  500 mg Oral Q8H  . carvedilol  3.125 mg  Oral BID WC  . diclofenac sodium  4 g Topical QID  . gabapentin  300 mg Oral QHS  . irbesartan  300 mg Oral Daily  . lidocaine  1 patch Transdermal Q24H  . methocarbamol  500 mg Oral BID  . mometasone-formoterol  2 puff Inhalation BID  . phenazopyridine  200 mg Oral TID WC  . polyethylene glycol  17 g Oral BID  . predniSONE  5 mg Oral BID WC  . QUEtiapine  25 mg Oral QHS  . senna-docusate  1 tablet Oral BID  . tamsulosin  0.4 mg Oral Daily   Continuous Infusions: . sodium chloride 10 mL/hr (09/15/18 0002)    Principal Problem:   Delirium Active Problems:   Essential hypertension   History of prostate cancer   Secondary malignant neoplasm of bone and bone marrow (HCC)   Pathologic compression fracture of spine, initial encounter (HCC)   Acute lower UTI   Cancer associated pain   Bladder cancer ([place])   Acute encephalopathy   Palliative care by specialist   Pain management   LOS: 6 days

## 2018-09-19 NOTE — Plan of Care (Signed)

## 2018-09-19 NOTE — Progress Notes (Signed)
Daily Progress Note   Patient Name: [name] Holder       Date: 09/19/2018 DOB: 1956-04-03  Age: 63 y.o. MRN#: [id number] Attending Physician: Samuella Cota, MD Primary Care Physician: [name], MD Admit Date: 09/12/2018  Reason for Consultation/Follow-up: Non pain symptom management, Pain control and Psychosocial/spiritual support   Subjective: Patient is awake alert, resting in bed.   He did not rest well overnight, he states that he had dysuria and also fecal incontinence overnight.   The patient states that his pain is no better/no worse. At times, it feels as if it is controlled, at times, he states he does have exacerbations of pain.   Length of Stay: 6  Current Medications: Scheduled Meds:  . abiraterone acetate  1,000 mg Oral q1800  . amoxicillin  500 mg Oral Q8H  . carvedilol  3.125 mg Oral BID WC  . diclofenac sodium  4 g Topical QID  . gabapentin  300 mg Oral QHS  . irbesartan  300 mg Oral Daily  . lidocaine  1 patch Transdermal Q24H  . methocarbamol  500 mg Oral BID  . mometasone-formoterol  2 puff Inhalation BID  . phenazopyridine  200 mg Oral TID WC  . polyethylene glycol  17 g Oral BID  . predniSONE  5 mg Oral BID WC  . QUEtiapine  25 mg Oral QHS  . senna-docusate  1 tablet Oral BID  . tamsulosin  0.4 mg Oral Daily    Continuous Infusions: . sodium chloride 10 mL/hr (09/15/18 0002)    PRN Meds: sodium chloride, acetaminophen **OR** acetaminophen, albuterol, bisacodyl, magnesium citrate, ondansetron **OR** ondansetron (ZOFRAN) IV, oxyCODONE  Physical Exam         Awake alert Responds appropriately In no distress S1 S2 Clear Abdomen is not distended He doesn't have edema More clearer this morning in mentation, in my opinion.   Vital  Signs: BP (!) 177/99 (BP Location: Right Arm) Comment: pt tensing up during BP reading  Pulse 96   Temp 97.6 F (36.4 C) (Oral)   Resp 18   Ht 5\' 7"  (1.702 m)   Wt 78.4 kg   SpO2 93%   BMI 27.08 kg/m  SpO2: SpO2: 93 % O2 Device: O2 Device: Room Air O2 Flow Rate: O2 Flow Rate (L/min): 2 L/min  Intake/output  summary:   Intake/Output Summary (Last 24 hours) at 09/19/2018 1230 Last data filed at 09/19/2018 0600 Gross per 24 hour  Intake 0 ml  Output -  Net 0 ml   LBM: Last BM Date: 09/19/18 Baseline Weight: Weight: 79.4 kg Most recent weight: Weight: 78.4 kg       Palliative Assessment/Data:    Flowsheet Rows     Most Recent Value  Intake Tab  Referral Department  Hospitalist  Unit at Time of Referral  Med/Surg Unit  Palliative Care Primary Diagnosis  Cancer  Date Notified  09/13/18  Palliative Care Type  New Palliative care  Reason for referral  Pain, Non-pain Symptom  Date of Admission  09/12/18  Date first seen by Palliative Care  09/14/18  # of days Palliative referral response time  1 Day(s)  # of days IP prior to Palliative referral  1  Clinical Assessment  Psychosocial & Spiritual Assessment  Palliative Care Outcomes      Patient Active Problem List   Diagnosis Date Noted  . Palliative care by specialist   . Pain management   . Pathologic compression fracture of spine, initial encounter ([place]) 09/13/2018  . Delirium 09/13/2018  . Acute lower UTI 09/13/2018  . Cancer associated pain 09/13/2018  . Bladder cancer ([place]) 09/13/2018  . Acute encephalopathy 09/13/2018  . Secondary malignant neoplasm of bone and bone marrow ([place]) 08/06/2018  . Spinal stenosis in cervical region 04/12/2018  . Weakness 04/11/2018  . Elevated LFTs 09/16/2017  . Routine general medical examination at a health care facility 08/29/2015  . Hyperlipidemia with target LDL less than 100 12/21/2013  . Other abnormal glucose 12/21/2013  . Upper airway cough syndrome 07/25/2013  .  History of prostate cancer   . Essential hypertension 05/20/2010  . Allergic rhinitis 05/20/2010  . Asthma, mild intermittent 05/20/2010    Palliative Care Assessment & Plan   Patient Profile:    Assessment:  prostate cancer Multiple compression fractures Chronic pain History of recent kyphoplasty Acute encephalopathy Bacteriuria Recent trans urethral resection of bladder tumor.  New vertebral compression fractures Back pain Constipation Generalized weakness Confusion  Recommendations/Plan:  patient's opioid regimen noted, will recommend continuation of the same. Now also on Gabapentin.  MRI Brain to be done.  Also on Seroquel HS.  D/C bowel regimen, patient having fecal incontinence overnight, bowel regimen prn.  Discussed with Dr Maryjean Ka from pain management/neurosurgery: appreciate his input and recommendations. Recommendations were made for additional imaging with MRI, TLSO Brace and continuation of opioids.  PMT to continue to follow.    Code Status:    Code Status Orders  (From admission, onward)         Start     Ordered   09/13/18 0328  Full code  Continuous     09/13/18 0328        Code Status History    Date Active Date Inactive Code Status Order ID Comments User Context   04/11/2018 0459 04/12/2018 1806 Full Code [id number]  Rodena Goldmann, DO ED    Advance Directive Documentation     Most Recent Value  Type of Advance Directive  Healthcare Power of Attorney, Living will  Pre-existing out of facility DNR order (yellow form or pink MOST form)  -  "MOST" Form in Place?  -       Prognosis:   Unable to determine  Discharge Planning:  To Be Determined  Care plan was discussed with  Patient, TRH MD, also discussed with  Dr Maryjean Ka from pain management/neurosurgery.   Thank you for allowing the Palliative Medicine Team to assist in the care of this patient.   Time In: 12 Time Out: 12.35 Total Time 35 Prolonged Time Billed  no       Greater  than 50%  of this time was spent counseling and coordinating care related to the above assessment and plan.  Loistine Chance, MD [phone]  Please contact Palliative Medicine Team phone at [phone] for questions and concerns.

## 2018-09-19 NOTE — Progress Notes (Signed)
Pt has had several incontinent episodes of urine and stool throughout the night. He continues to have frequency and urgency, mostly dribbling each time he urinates. Bladder scan performed at 0200 showed 135 mL urine. Pt noted to be having visual hallucinations at the beginning of the shift. He is able to be redirected, but requires many interactions with staff throughout the night. Medicated x1 with PRN pain medication. Will continue to monitor.

## 2018-09-20 ENCOUNTER — Other Ambulatory Visit: Payer: Self-pay | Admitting: Family

## 2018-09-20 LAB — COMPREHENSIVE METABOLIC PANEL
ALT: 58 U/L — ABNORMAL HIGH (ref 0–44)
AST: 36 U/L (ref 15–41)
Albumin: 3.1 g/dL — ABNORMAL LOW (ref 3.5–5.0)
Alkaline Phosphatase: 103 U/L (ref 38–126)
Anion gap: 6 (ref 5–15)
BUN: 12 mg/dL (ref 8–23)
CO2: 19 mmol/L — ABNORMAL LOW (ref 22–32)
Calcium: 9.1 mg/dL (ref 8.9–10.3)
Chloride: 114 mmol/L — ABNORMAL HIGH (ref 98–111)
Creatinine, Ser: 0.67 mg/dL (ref 0.61–1.24)
GFR calc Af Amer: >60 mL/min (ref >60)
GFR calc non Af Amer: >60 mL/min (ref >60)
Glucose, Bld: 98 mg/dL (ref 70–99)
Potassium: 4.7 mmol/L (ref 3.5–5.1)
Sodium: 139 mmol/L (ref 135–145)
Total Bilirubin: 1 mg/dL (ref 0.3–1.2)
Total Protein: 5.4 g/dL — ABNORMAL LOW (ref 6.5–8.1)

## 2018-09-20 MED ORDER — ZINC OXIDE 40 % EX OINT
TOPICAL_OINTMENT | CUTANEOUS | Status: DC | PRN
  Administered 2018-09-20: 19:00:00 via TOPICAL
  Filled 2018-09-20 (×2): qty 57

## 2018-09-20 NOTE — Progress Notes (Signed)
Clinical Social Worker following patient and wife for support and discharge needs. CSW gave family a list of facilities that made a bed offer on behalf of patient. Patients wife Curt Bears) stated that she would prefer patient to go to a facility in [place] since it is close to her employment. Family stated they would prefer [facility]. CSW stated to spouse that she will try her best to get patient into [facility] but stated facilities are reluctant to take commercial BC/BS. CSW stated to family that if [facility] is unable to take patient  That family will have to pick from the list CSW provided to them.   [name], MSW,  [place]

## 2018-09-20 NOTE — Progress Notes (Signed)
Daily Progress Note   Patient Name: [name] Holder       Date: 09/20/2018 DOB: 22-Sep-1955  Age: 63 y.o. MRN#: [id number] Attending Physician: Samuella Cota, MD Primary Care Physician: [name], MD Admit Date: 09/12/2018  Reason for Consultation/Follow-up: Non pain symptom management, Pain control and Psychosocial/spiritual support   Subjective: Patient is awake alert, resting in bed reading a magazine.   Reports being happy to see me, states it has "been a long time," and reports he is glad that I came to visit (I do not recall meeting him before).  The patient states that his pain is better today. While feels as if it is controlled,  he states he does continue to have intense exacerbations of pain.   Length of Stay: 7  Current Medications: Scheduled Meds:  . abiraterone acetate  1,000 mg Oral q1800  . amoxicillin  500 mg Oral Q8H  . carvedilol  3.125 mg Oral BID WC  . diclofenac sodium  4 g Topical QID  . gabapentin  300 mg Oral QHS  . irbesartan  300 mg Oral Daily  . lidocaine  1 patch Transdermal Q24H  . methocarbamol  500 mg Oral BID  . mometasone-formoterol  2 puff Inhalation BID  . phenazopyridine  200 mg Oral TID WC  . polyethylene glycol  17 g Oral BID  . predniSONE  5 mg Oral BID WC  . QUEtiapine  25 mg Oral QHS  . senna-docusate  1 tablet Oral BID  . tamsulosin  0.4 mg Oral Daily    Continuous Infusions: . sodium chloride Stopped (09/19/18 0700)    PRN Meds: sodium chloride, acetaminophen **OR** acetaminophen, albuterol, bisacodyl, magnesium citrate, ondansetron **OR** ondansetron (ZOFRAN) IV, oxyCODONE  Physical Exam         Awake alert Responds appropriately, but ? confused In no distress S1 S2 Clear Abdomen is not distended He doesn't have  edema  Vital Signs: BP (!) 148/84 (BP Location: Right Arm)   Pulse 92   Temp 98.3 F (36.8 C) (Oral)   Resp 18   Ht 5\' 7"  (1.702 m)   Wt 78.4 kg   SpO2 99%   BMI 27.08 kg/m  SpO2: SpO2: 99 % O2 Device: O2 Device: Room Air O2 Flow Rate: O2 Flow Rate (L/min): 2 L/min  Intake/output  summary:   Intake/Output Summary (Last 24 hours) at 09/20/2018 1504 Last data filed at 09/20/2018 1400 Gross per 24 hour  Intake 0 ml  Output 0 ml  Net 0 ml   LBM: Last BM Date: 09/20/18 Baseline Weight: Weight: 79.4 kg Most recent weight: Weight: 78.4 kg       Palliative Assessment/Data:    Flowsheet Rows     Most Recent Value  Intake Tab  Referral Department  Hospitalist  Unit at Time of Referral  Med/Surg Unit  Palliative Care Primary Diagnosis  Cancer  Date Notified  09/13/18  Palliative Care Type  New Palliative care  Reason for referral  Pain, Non-pain Symptom  Date of Admission  09/12/18  Date first seen by Palliative Care  09/14/18  # of days Palliative referral response time  1 Day(s)  # of days IP prior to Palliative referral  1  Clinical Assessment  Psychosocial & Spiritual Assessment  Palliative Care Outcomes      Patient Active Problem List   Diagnosis Date Noted  . Palliative care by specialist   . Pain management   . Pathologic compression fracture of spine, initial encounter ([place]) 09/13/2018  . Delirium 09/13/2018  . Acute lower UTI 09/13/2018  . Cancer associated pain 09/13/2018  . Bladder cancer ([place]) 09/13/2018  . Acute encephalopathy 09/13/2018  . Secondary malignant neoplasm of bone and bone marrow ([place]) 08/06/2018  . Spinal stenosis in cervical region 04/12/2018  . Weakness 04/11/2018  . Elevated LFTs 09/16/2017  . Routine general medical examination at a health care facility 08/29/2015  . Hyperlipidemia with target LDL less than 100 12/21/2013  . Other abnormal glucose 12/21/2013  . Upper airway cough syndrome 07/25/2013  . History of prostate cancer    . Essential hypertension 05/20/2010  . Allergic rhinitis 05/20/2010  . Asthma, mild intermittent 05/20/2010    Palliative Care Assessment & Plan   Patient Profile:    Assessment:  prostate cancer Multiple compression fractures Chronic pain History of recent kyphoplasty Acute encephalopathy Bacteriuria Recent trans urethral resection of bladder tumor.  New vertebral compression fractures Back pain Constipation Generalized weakness Confusion  Recommendations/Plan: Recommend continuation of the same opioid regimen.  Also on Seroquel HS.  Bowel regimen prn.  Dr Rowe Pavy discussed with Dr Maryjean Ka from pain management/neurosurgery: appreciate his input and recommendations. Recommendations were made for TLSO Brace and continuation of opioids.  PMT to continue to follow while inpatient.  Discussed with his wife today as she was leaving.  She reports no concerns or questions at this time.  Awaiting options for placement for rehab trial.    Code Status:    Code Status Orders  (From admission, onward)         Start     Ordered   09/13/18 0328  Full code  Continuous     09/13/18 0328        Code Status History    Date Active Date Inactive Code Status Order ID Comments User Context   04/11/2018 0459 04/12/2018 1806 Full Code [id number]  Rodena Goldmann, DO ED    Advance Directive Documentation     Most Recent Value  Type of Advance Directive  Healthcare Power of Attorney, Living will  Pre-existing out of facility DNR order (yellow form or pink MOST form)  -  "MOST" Form in Place?  -       Prognosis:   Unable to determine  Discharge Planning:  To Be Determined  Care plan was discussed with  Patient, TRH MD  Thank you for allowing the Palliative Medicine Team to assist in the care of this patient.   Time In: 1400 Time Out: 1430 Total Time 30 Prolonged Time Billed  no       Greater than 50%  of this time was spent counseling and coordinating care related to the  above assessment and plan.  Micheline Rough, MD [phone]  Please contact Palliative Medicine Team phone at [phone] for questions and concerns.

## 2018-09-20 NOTE — Progress Notes (Addendum)
PROGRESS NOTE  [name] Holder LNL:[id number] DOB: Dec 15, 1955 DOA: 09/12/2018 PCP: [name], MD  Dr. Alen Blew  Brief History   63 year old man PMH castration resistant prostate cancer, multiple compression fractures causing chronic pain, history of kyphoplasty T10, T12, L1, L2, recently started on MS Contin for chronic pain, however developed hallucinations and sedation and was sent to the hospital for further evaluation for acute encephalopathy.  He is continued to have delirium with some hallucinations although his clinical condition has gradually improved.  Suspect delirium will resolve over the next several weeks.  Extensive work-up was unrevealing.  In regard to compression fractures, plan for outpatient follow-up with interventional radiology for consideration of kyphoplasty.  Seen by urology as the patient already had a procedure scheduled and underwent transurethral resection of the bladder tumor.  Plan is to follow-up with urology as an outpatient.  PT evaluated the patient and at this point SNF has been recommended.  A & P  Acute encephalopathy, delirium secondary to adverse reaction to MS Contin, complicated by chronic back pain.  UTI was questioned on admission.  Patient did not have urinary symptoms.  History most suggestive of adverse reaction to MS Contin.  TSH, SERUM AMMONIA WNL.  CT head negative.  MRI brain limited but unremarkable. --Continues to have mild delirium and confusion but follows commands and does remember some details directly.  Extensive work-up unrevealing, OxyContin was stopped yesterday in an effort to help clear sensorium.  He seems to be stable on oxycodone as needed.  Suspect delirium will take some time to clear, no further inpatient investigation recommended. --Continue Seroquel nightly, will check EKG in AM to assess QTc  Bacteriuria --Completes 5-day course of amoxicillin 2/25 as per Dr. Alinda Money.  New vertebral compression fractures superimposed on  chronic back pain secondary to compression fracture T10, T12.  Status post kyphoplasty with bone biopsy which showed no malignancy. MS Contin stopped, started on OxyContin given previous tolerance for oxycodone.  Appreciate palliative medicine adjusting medications for pain control.  Overall improving. --Pain is fairly well controlled.  Appreciate palliative medicine.  Continue short acting oxycodone and gabapentin.  Continue TLSO brace.  Not a candidate for MS Contin or fentanyl patch.  Low-grade urothelial carcinoma.  Status post  transurethral resection of bladder tumor 2/20 with Dr. Alinda Money --Outpatient follow-up with Dr. 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   --Continue Zytiga daily with prednisone 5 mg daily.    Plan transfer to SNF when authorization obtained  DVT prophylaxis: SCDs Code Status: Full Family Communication: Wife at bedside Disposition Plan: SNF    [name], MD  [facility] Direct contact: see [email or web address]  7PM-7AM contact night coverage as above 09/20/2018, 3:51 PM  LOS: 7 days   Consultants  . Interventional radiology . Urology  Procedures  1.  Cystoscopy 2.  Pelvic exam under anesthesia 3.  Transurethral resection of bladder tumor (1.2 cm) 4.  Postoperative instillation of intravesical gemcitabine chemotherapy  Antibiotics  .   Interval History/Subjective  Confused at times but overall seems to be feeling okay.  Pain seems to be well controlled.  Constipation has resolved.  Dysuria seems to have resolved.  Continues to have some hallucinations.  Objective   Vitals:  Vitals:   09/20/18 0800 09/20/18 1356  BP:  (!) 148/84  Pulse: 98 92  Resp: 17 18  Temp:  98.3 F (36.8 C)  SpO2: 99% 99%    Exam: Constitutional:   . Appears calm and  comfortable Eyes:  . pupils appear normal ENMT:  . grossly normal hearing  Respiratory:  . CTA bilaterally, no w/r/r.  . Respiratory effort  normal.  Cardiovascular:  . RRR, no m/r/g . No LE extremity edema   Musculoskeletal:  . RLE, LLE   . strength and tone grossly normal Psychiatric:  . Mental status o Mood appears grossly normal, affect appropriate o Slightly confused, oriented to self and year but not month or location  I have personally reviewed the following:   Today's Data  . Potassium 4.7, remainder BMP unremarkable.  AST has normalized.  ALT trending down.  Total bilirubin within normal limits. [name] MRI limited but no acute abnormality noted.  Lab Data  . Noted  Micro Data  . Urine culture with enterococcus, sensitivities noted  Imaging  . Chest x-ray no acute disease. . CT head no acute disease. . CTA chest no PE. . CT abdomen pelvis showed new compression fractures at T8, T9, L3 . MRI brain no acute ischemia.  Chronic small vessel disease and mild atrophy.  Cardiology Data  .   Other Data  . Surgical pathology noted, low-grade urothelial carcinoma  Scheduled Meds: . abiraterone acetate  1,000 mg Oral q1800  . amoxicillin  500 mg Oral Q8H  . carvedilol  3.125 mg Oral BID WC  . diclofenac sodium  4 g Topical QID  . gabapentin  300 mg Oral QHS  . irbesartan  300 mg Oral Daily  . lidocaine  1 patch Transdermal Q24H  . methocarbamol  500 mg Oral BID  . mometasone-formoterol  2 puff Inhalation BID  . phenazopyridine  200 mg Oral TID WC  . polyethylene glycol  17 g Oral BID  . predniSONE  5 mg Oral BID WC  . QUEtiapine  25 mg Oral QHS  . senna-docusate  1 tablet Oral BID  . tamsulosin  0.4 mg Oral Daily   Continuous Infusions: . sodium chloride Stopped (09/19/18 0700)    Principal Problem:   Delirium Active Problems:   Essential hypertension   History of prostate cancer   Secondary malignant neoplasm of bone and bone marrow (HCC)   Pathologic compression fracture of spine, initial encounter (HCC)   Acute lower UTI   Cancer associated pain   Bladder cancer ([place])   Acute encephalopathy    Palliative care by specialist   Pain management   LOS: 7 days   Time spent 35 minutes, greater than 50% counseling coordination of care, discussion with patient, wife at bedside, palliative medicine, social work, case Mudlogger, pharmacy

## 2018-09-20 NOTE — Progress Notes (Signed)
Occupational Therapy Treatment Patient Details Name: [name] Holder MRN: [id number] DOB: Feb 15, 1956 Today's Date: 09/20/2018    History of present illness 63 yo male admitted to ED on 2/17 for medication-associated delirium. Pt with metastatic prostate cancer dx in 2014 with mets to bone/spine. head CT - for acute findings, CT abdomen/pelvis reveals new pathologic compression fractures of T8, T9, L3. Pt with history of kyphoplasty for pathologic compression fractures T10,T12, L1,L2 08/26/18. Imaging of chest reveals atelectasis of L mid to lower lung and air distention of small bowel. Pt s/p transurethral resection of bladder tumor 09/15/18. Other PMH includes chronic LBP, chronic pain, PNA, DOE, osteopenia.    OT comments  Pt has TLSO this day ordered by palliative.  Spoke with MD Sarajane Jews regarding donning brace in sitting )   Follow Up Recommendations  SNF   Equipment Recommendations  3 in 1 bedside commode    Recommendations for Other Services      Precautions / Restrictions Precautions Precautions: Fall;Back Precaution Comments: Pt with pathlogic vertebral compression fractures T8, T9, L3 with recent kyphoplasty for T10, T12, L1, L2. Log roll technique utilized for protection of spine, no orders for brace.  Required Braces or Orthoses: Spinal Brace Spinal Brace: Thoracolumbosacral orthotic;Applied in sitting position Restrictions Weight Bearing Restrictions: No       Mobility Bed Mobility Overal bed mobility: Needs Assistance Bed Mobility: Supine to Sit;Sit to Supine     Supine to sit: Min assist;HOB elevated Sit to supine: Min assist;HOB elevated   General bed mobility comments: Min assist for supine to sit for sequencing with log roll technique for protection of back, physical assist for elevating trunk and lowering legs simultaneously. Mod assist for sit to supine for trunk lowering and LE lifting into bed, use of boost function on bed to   Transfers Overall  transfer level: Needs assistance Equipment used: Rolling walker (2 wheeled) Transfers: Sit to/from [facility] Sit to Stand: Min assist;Min guard Stand pivot transfers: Min guard            Balance Overall balance assessment: Needs assistance Sitting-balance support: Feet supported Sitting balance-Leahy Scale: Good     Standing balance support: Bilateral upper extremity supported Standing balance-Leahy Scale: Poor Standing balance comment: relies on RW and OT for steadying                            ADL either performed or assessed with clinical judgement   ADL       Grooming: Supervision/safety;Standing               Lower Body Dressing: Sit to/from stand;Cueing for safety;Cueing for sequencing;Cueing for compensatory techniques;Minimal assistance   Toilet Transfer: Minimal assistance;RW;Comfort height toilet;Ambulation;Cueing for safety   Toileting- Clothing Manipulation and Hygiene: Minimal assistance;Sit to/from stand;Cueing for sequencing;Cueing for compensatory techniques;Cueing for safety;Cueing for back precautions         General ADL Comments: donned TLSO sittig EOB . Spoke with Dr Sarajane Jews regarding donning brace in sitting.       Vision Patient Visual Report: No change from baseline            Cognition Arousal/Alertness: Awake/alert Behavior During Therapy: Impulsive Overall Cognitive Status: Impaired/Different from baseline Area of Impairment: Attention;Memory;Following commands;Safety/judgement;Problem solving                   Current Attention Level: Selective Memory: Decreased short-term memory Following Commands: Follows one step commands with increased  time Safety/Judgement: Decreased awareness of deficits;Decreased awareness of safety   Problem Solving: Difficulty sequencing;Requires verbal cues;Requires tactile cues                     Pertinent Vitals/ Pain       Pain Score: 2  Pain  Location: back  Pain Descriptors / Indicators: Dull Pain Intervention(s): Limited activity within patient's tolerance;Repositioned;Monitored during session     Prior Functioning/Environment              Frequency  Min 2X/week           Plan Discharge plan remains appropriate       AM-PAC OT "6 Clicks" Daily Activity     Outcome Measure   Help from another person eating meals?: None Help from another person taking care of personal grooming?: A Little Help from another person toileting, which includes using toliet, bedpan, or urinal?: A Lot Help from another person bathing (including washing, rinsing, drying)?: A Lot Help from another person to put on and taking off regular upper body clothing?: A Little Help from another person to put on and taking off regular lower body clothing?: A Lot 6 Click Score: 16    End of Session Equipment Utilized During Treatment: Rolling walker;Gait belt  OT Visit Diagnosis: Unsteadiness on feet (R26.81);Other abnormalities of gait and mobility (R26.89);Muscle weakness (generalized) (M62.81);History of falling (Z91.81);Pain   Activity Tolerance Other (comment);Patient tolerated treatment well(limited by bladder urgency)   Patient Left with family/visitor present;in bed;with bed alarm set   Nurse Communication Mobility status        Time: 6004-5997 OT Time Calculation (min): 30 min  Charges: OT General Charges $OT Visit: 1 Visit OT Treatments $Self Care/Home Management : 23-37 mins  Kari Baars, [place] [phone] Office- [place], Edwena Felty D 09/20/2018, 4:04 PM

## 2018-09-20 NOTE — Progress Notes (Signed)
Patient ID: [name] Holder, male   DOB: 09/02/1955, 63 y.o.   MRN: [id number]  5 Days Post-Op Subjective: Dysuria beginning to improve.  No need for catheterization over last 24 hrs.  Objective: Vital signs in last 24 hours: Temp:  [98.1 F (36.7 C)-98.2 F (36.8 C)] 98.2 F (36.8 C) (02/25 0544) Pulse Rate:  [93-99] 99 (02/25 0544) Resp:  [15-20] 18 (02/25 0544) BP: (129-158)/(78-94) 151/94 (02/25 0544) SpO2:  [93 %-99 %] 99 % (02/25 0544)  Intake/Output from previous day: No intake/output data recorded. Intake/Output this shift: No intake/output data recorded.  Physical Exam:  General: Alert and oriented   Lab Results: No results for input(s): HGB, HCT in the last 72 hours. BMET Recent Labs    09/19/18 0609 09/20/18 0605  NA 138 139  K 3.1* 4.7  CL 107 114*  CO2 23 19*  GLUCOSE 98 98  BUN 11 12  CREATININE 0.65 0.67  CALCIUM 8.9 9.1     Studies/Results: Mr Brain Wo Contrast  Result Date: 09/19/2018 CLINICAL DATA:  Altered mental status EXAM: MRI HEAD WITHOUT CONTRAST TECHNIQUE: Multiplanar, multiecho pulse sequences of the brain and surrounding structures were obtained without intravenous contrast. COMPARISON:  None. FINDINGS: The examination had to be discontinued prior to completion due to patient mental status. The patient voluntarily aborted the examination. Axial diffusion-weighted imaging sagittal T1-weighted imaging and axial T2 and FLAIR sequences were obtained. BRAIN: There is no acute infarct, acute hemorrhage, hydrocephalus or extra-axial collection. The midline structures are normal. No midline shift or other mass effect. Multifocal white matter hyperintensity, most commonly due to chronic ischemic microangiopathy. Generalized atrophy without lobar predilection. VASCULAR: Major intracranial arterial and venous sinus flow voids are normal. SKULL AND UPPER CERVICAL SPINE: Calvarial bone marrow signal is normal. There is no skull base mass. Visualized upper  cervical spine and soft tissues are normal. SINUSES/ORBITS: No fluid levels or advanced mucosal thickening. No mastoid or middle ear effusion. The orbits are normal. IMPRESSION: 1. Truncated examination. 2. No acute ischemia. 3. Findings of chronic small vessel disease and mild atrophy. Electronically Signed   By: Ulyses Jarred M.D.   On: 09/19/2018 20:57    Assessment/Plan: 1) Bladder cancer: Reviewed path.  F/U as scheduled. 2) CRPC: Continue current treatment.  Follow up as scheduled. 3) Dysuria/frequency:  Ok to continue symptomatic treatment with phenazopyridine and tamsulosin.  Getting better as expected.    If patient is not able/ready to keep appt for Friday as planned, will reschedule his appt to continue appropriate prostate and bladder cancer treatment and follow up.    LOS: 7 days   Dutch Gray 09/20/2018, 7:42 AM

## 2018-09-20 NOTE — Progress Notes (Signed)
Physical Therapy Treatment Patient Details Name: [name] Holder MRN: [id number] DOB: March 09, 1956 Today's Date: 09/20/2018    History of Present Illness 63 yo male admitted to ED on 2/17 for medication-associated delirium. Pt with metastatic prostate cancer dx in 2014 with mets to bone/spine. head CT - for acute findings, CT abdomen/pelvis reveals new pathologic compression fractures of T8, T9, L3. Pt with history of kyphoplasty for pathologic compression fractures T10,T12, L1,L2 08/26/18. Imaging of chest reveals atelectasis of L mid to lower lung and air distention of small bowel. Pt s/p transurethral resection of bladder tumor 09/15/18. Other PMH includes chronic LBP, chronic pain, PNA, DOE, osteopenia.     PT Comments    Pt was seen for instruction of body mechanics, application of back brace, sequence of gait and transfers, and reversal of the process.   Pt is unable to give return on the instructions without cues for each step.  He is unable to recall any of the instructions for back brace, and needs partial recues for bed mobility and transfers.  Follow acutely to progress and direct all movement for gait and transfers, and  to ease transition to the next venue.   Follow Up Recommendations  SNF     Equipment Recommendations  None recommended by PT    Recommendations for Other Services       Precautions / Restrictions Precautions Precautions: Fall;Back Precaution Comments: Pt with pathlogic vertebral compression fractures T8, T9, L3 with recent kyphoplasty for T10, T12, L1, L2. Log roll technique utilized for protection of spine, no orders for brace.  Required Braces or Orthoses: Spinal Brace Spinal Brace: Thoracolumbosacral orthotic;Applied in sitting position Restrictions Weight Bearing Restrictions: No    Mobility  Bed Mobility Overal bed mobility: Needs Assistance Bed Mobility: Supine to Sit;Sit to Supine     Supine to sit: Min assist;HOB elevated Sit to supine: Min  assist;HOB elevated   General bed mobility comments: Min assist for supine to sit for sequencing with log roll technique for protection of back, physical assist for elevating trunk and lowering legs simultaneously. Mod assist for sit to supine for trunk lowering and LE lifting into bed, use of boost function on bed to   Transfers Overall transfer level: Needs assistance Equipment used: Rolling walker (2 wheeled) Transfers: Sit to/from [facility] Sit to Stand: Min assist;Min guard Stand pivot transfers: Min guard          Ambulation/Gait Ambulation/Gait assistance: Min guard Gait Distance (Feet): 110 Feet Assistive device: Rolling walker (2 wheeled) Gait Pattern/deviations: Step-through pattern;Decreased stride length;Shuffle;Wide base of support Gait velocity: decreased Gait velocity interpretation: <1.31 ft/sec, indicative of household ambulator General Gait Details: min guard for steadying his balace but is able to control with walker   Stairs             Wheelchair Mobility    Modified Rankin (Stroke Patients Only)       Balance Overall balance assessment: Needs assistance Sitting-balance support: Feet supported Sitting balance-Leahy Scale: Good     Standing balance support: Bilateral upper extremity supported Standing balance-Leahy Scale: Fair Standing balance comment: RW for balance                            Cognition Arousal/Alertness: Awake/alert Behavior During Therapy: Impulsive Overall Cognitive Status: Impaired/Different from baseline Area of Impairment: Memory;Following commands;Safety/judgement;Awareness;Problem solving                   Current  Attention Level: Selective Memory: Decreased recall of precautions;Decreased short-term memory Following Commands: Follows one step commands inconsistently;Follows one step commands with increased time Safety/Judgement: Decreased awareness of safety;Decreased  awareness of deficits Awareness: Intellectual Problem Solving: Slow processing;Decreased initiation;Difficulty sequencing;Requires verbal cues;Requires tactile cues General Comments: pt is demonstrating abiltiy to follow directions but cannot remember the instructions      Exercises      General Comments        Pertinent Vitals/Pain Pain Assessment: 0-10 Pain Score: 8  Pain Location: back  Pain Descriptors / Indicators: Aching Pain Intervention(s): Monitored during session;Premedicated before session;Repositioned    Home Living                      Prior Function            PT Goals (current goals can now be found in the care plan section) Acute Rehab PT Goals Patient Stated Goal: go home to wife and dogs  Progress towards PT goals: Progressing toward goals    Frequency    Min 2X/week      PT Plan Current plan remains appropriate    Co-evaluation              AM-PAC PT "6 Clicks" Mobility   Outcome Measure  Help needed turning from your back to your side while in a flat bed without using bedrails?: A Lot Help needed moving from lying on your back to sitting on the side of a flat bed without using bedrails?: A Lot Help needed moving to and from a bed to a chair (including a wheelchair)?: A Lot Help needed standing up from a chair using your arms (e.g., wheelchair or bedside chair)?: A Little Help needed to walk in hospital room?: A Little Help needed climbing 3-5 steps with a railing? : A Lot 6 Click Score: 14    End of Session Equipment Utilized During Treatment: Gait belt Activity Tolerance: Patient limited by fatigue Patient left: in bed;with call bell/phone within reach;with bed alarm set Nurse Communication: Mobility status PT Visit Diagnosis: Other abnormalities of gait and mobility (R26.89);Muscle weakness (generalized) (M62.81)     Time: 6010-9323 PT Time Calculation (min) (ACUTE ONLY): 28 min  Charges:  $Gait Training: 8-22  mins $Therapeutic Activity: 8-22 mins                    Ramond Dial 09/20/2018, 4:36 PM  Mee Hives, PT MS Acute Rehab Dept. Number: [place]

## 2018-09-21 DIAGNOSIS — R410 Disorientation, unspecified: Secondary | ICD-10-CM

## 2018-09-21 MED ORDER — GABAPENTIN 300 MG PO CAPS: 300.0000 mg | ORAL_CAPSULE | Freq: Every day | ORAL | 0 refills | Status: DC

## 2018-09-21 MED ORDER — OXYCODONE HCL 5 MG PO TABS: 5.0000 mg | ORAL_TABLET | Freq: Four times a day (QID) | ORAL | 0 refills | Status: AC | PRN

## 2018-09-21 MED ORDER — PHENAZOPYRIDINE HCL 200 MG PO TABS: 200.0000 mg | ORAL_TABLET | Freq: Three times a day (TID) | ORAL | 0 refills | Status: AC

## 2018-09-21 MED ORDER — CYCLOBENZAPRINE HCL 5 MG PO TABS: 5.0000 mg | ORAL_TABLET | Freq: Three times a day (TID) | ORAL | 0 refills | Status: AC | PRN

## 2018-09-21 MED ORDER — TAMSULOSIN HCL 0.4 MG PO CAPS: 0.4000 mg | ORAL_CAPSULE | Freq: Every day | ORAL | Status: DC

## 2018-09-21 MED ORDER — PREDNISONE 5 MG PO TABS: 5.0000 mg | ORAL_TABLET | Freq: Every day | ORAL | Status: DC

## 2018-09-21 MED ORDER — DICLOFENAC SODIUM 1 % TD GEL: 4.0000 g | Freq: Four times a day (QID) | TRANSDERMAL | Status: DC

## 2018-09-21 MED ORDER — QUETIAPINE FUMARATE 25 MG PO TABS: 25.0000 mg | ORAL_TABLET | Freq: Every day | ORAL | 0 refills | Status: DC

## 2018-09-21 MED ORDER — METHOCARBAMOL 500 MG PO TABS: 500.0000 mg | ORAL_TABLET | Freq: Two times a day (BID) | ORAL | 0 refills | Status: AC

## 2018-09-21 NOTE — Progress Notes (Signed)
EKG obtained per order and placed on chart. Normal EKG showing NSR.

## 2018-09-21 NOTE — Progress Notes (Signed)
Clinical Social Worker following patient and spouse for support and discharge needs. CSW met family at bedside to discuss discharge plans. Spouse [name] Holder stated she has decided that patient will discharge to [facility] under a private pay. CSW went over the process of private paying with spouse and family stated they are agreeable with process. CSW will reach back out to [facility] to make sure they have a bed available for patient. CSW will continue to follow for discharge needs.   [name], MSW,  [place]

## 2018-09-21 NOTE — Discharge Summary (Addendum)
Physician Discharge Summary  [name] Holder IDP:[id number] DOB: [birth year] DOA: 09/12/2018  PCP: [name], MD  Admit date: 09/12/2018 Discharge date: 09/22/2018  Admitted From: Home Discharge disposition: SNF  Discharge Diagnosis:   Active Problems:   Essential hypertension   History of prostate cancer   Secondary malignant neoplasm of bone and bone marrow ([place])   Pathologic compression fracture of spine, initial encounter [facility])   Cancer associated pain   Bladder cancer [facility])   Palliative care by specialist   Pain management  Discharge Condition: Improved. Diet recommendation: regular Wound care: none Code status: Full.  History of Present Illness / Brief narrative:  Patient is a 63 year old man PMH castration resistant prostate cancer, multiple compression fractures causing chronic pain, history of kyphoplasty T10, T12, L1, L2, recently started on MS Contin for chronic pain, however developed hallucinations and sedation and was sent to the hospital for further evaluation for acute encephalopathy.   He continues to have delirium with some hallucinations although his clinical condition has gradually improved.  Suspect delirium will resolve over the next several weeks.  Extensive work-up was unrevealing. In regards to compression fractures, plan is for outpatient follow-up with interventional radiology for consideration of kyphoplasty.  Seen by urology as the patient already had a procedure scheduled and underwent transurethral resection of the bladder tumor.  Plan is to follow-up with urology as an outpatient. PT evaluated the patient and at this point SNF has been recommended.  Hospital Course:  Acute encephalopathy, delirium secondary to adverse reaction to MS Contin, complicated by chronic back pain.  UTI was questioned on admission.  Patient did not have urinary symptoms.  History most suggestive of adverse reaction to MS Contin.  TSH, SERUM AMMONIA WNL.  CT head  negative.  MRI brain was limited but unremarkable. --Continues to have mild delirium and confusion but follows commands and does remember some details directly.  Extensive work-up unrevealing, OxyContin was stopped in an effort to help clear sensorium.  He seems to be stable on oxycodone as needed.  Suspect delirium will take some time to clear, no further inpatient investigation recommended. --Continue Seroquel nightly, EKG this morning shows QTC of 457 ms.  Bacteriuria --Completed 5-day course of amoxicillin on 2/25.  New vertebral compression fractures superimposed on chronic back pain secondary to compression fracture T10, T12.  Status post kyphoplasty with bone biopsy which showed no malignancy. MS Contin was stopped, started on PRN oxycodone.  Appreciate palliative medicine adjusting medications for pain control.  Overall improving. --Pain is fairly well controlled.  Appreciate palliative medicine.  Continue short acting oxycodone and gabapentin.  Continue TLSO brace.  Not a candidate for MS Contin or fentanyl patch.  Low-grade urothelial carcinoma.  Status post  transurethral resection of bladder tumor 2/20 with Dr. Alinda Money --Outpatient follow-up with Dr. 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   --Continue Zytiga daily with prednisone 5 mg daily.   Medical Consultants:    None.   Discharge Exam:   Vitals:   09/22/18 0609 09/22/18 0820  BP: 107/64   Pulse: 79   Resp: 16   Temp: 97.6 F (36.4 C)   SpO2: 99% 98%   Vitals:   09/21/18 2038 09/21/18 2223 09/22/18 0609 09/22/18 0820  BP:  131/81 107/64   Pulse:  95 79   Resp:  (!) 22 16   Temp:  97.8 F (36.6 C) 97.6 F (36.4 C)   TempSrc:  Oral Oral   SpO2:  96% 99% 99% 98%  Weight:      Height:        General exam: Appears calm and comfortable.  Skin: No rashes, lesions or ulcers. Respiratory system: Clear to auscultation bilaterally Cardiovascular system: Regular  rate and rhythm, no murmur Gastrointestinal system: Soft, nontender, nondistended, bowel sounds present Central nervous system: Alert, awake, oriented to place Psychiatry: Mood & affect appropriate.  Extremities: No pedal edema, no calf tenderness   The results of significant diagnostics from this hospitalization (including imaging, microbiology, ancillary and laboratory) are listed below for reference.    Procedures and Diagnostic Studies:   Ct Head W Or Wo Contrast  Result Date: 09/13/2018 CLINICAL DATA:  Initial evaluation for hallucinations. History of metastatic prostate cancer. EXAM: CT HEAD WITHOUT AND WITH CONTRAST TECHNIQUE: Contiguous axial images were obtained from the base of the skull through the vertex without and with intravenous contrast CONTRAST:  121m ISOVUE-370 IOPAMIDOL (ISOVUE-370) INJECTION 76% COMPARISON:  Prior MRI from 04/11/2018 FINDINGS: Brain: Examination limited by motion artifact. Generalized age-related cerebral atrophy. No acute intracranial hemorrhage. No acute large vessel territory infarct. No mass lesion, midline shift or mass effect. No hydrocephalus. No extra-axial fluid collection. No appreciable abnormal enhancement. Vascular: No hyperdense vessel. Scattered vascular calcifications noted within the carotid siphons. Normal intravascular enhancement seen throughout the intracranial vasculature following contrast administration. Skull: Scalp soft tissues demonstrate no acute finding. Calvarium intact. No definite focal osseous lesions on this limited motion degraded exam. Corticated lucency extending through the anterior arch of C1, chronic in appearance, likely congenital. Sinuses/Orbits: Globes and orbital soft tissues within normal limits. Paranasal sinuses and mastoid air cells are grossly clear. Other: None. IMPRESSION: 1. Technically limited exam due to extensive motion artifact. 2. Grossly negative head CT with and without contrast. No acute intracranial  abnormality. No mass lesion or abnormal enhancement. Electronically Signed   By: BJeannine BogaM.D.   On: 09/13/2018 02:17   Ct Angio Chest Pe W And/or Wo Contrast  Result Date: 09/13/2018 CLINICAL DATA:  63year old male with shortness of breath, possible small bowel obstruction. Metastatic prostate cancer. EXAM: CT ANGIOGRAPHY CHEST CT ABDOMEN AND PELVIS WITH CONTRAST TECHNIQUE: Multidetector CT imaging of the chest was performed using the standard protocol during bolus administration of intravenous contrast. Multiplanar CT image reconstructions and MIPs were obtained to evaluate the vascular anatomy. Multidetector CT imaging of the abdomen and pelvis was performed using the standard protocol during bolus administration of intravenous contrast. CONTRAST:  1070mISOVUE-370 IOPAMIDOL (ISOVUE-370) INJECTION 76% COMPARISON:  Head CT today reported separately. Chest CTA 08/10/2018. CT Abdomen and Pelvis 07/28/2018. FINDINGS: CTA CHEST FINDINGS Cardiovascular: Good contrast bolus timing in the pulmonary arterial tree. Respiratory motion. No central pulmonary embolus. The upper lobe segmental branches also appear patent. The lower lobe branches are poorly evaluated due to motion. No cardiomegaly or pericardial effusion. Stable mild tortuosity of the thoracic aorta. Mediastinum/Nodes: No lymphadenopathy. Lungs/Pleura: Major airways are patent. Chronic atelectasis or scarring in the left lower lung. No pleural effusion or acute pulmonary opacity. Musculoskeletal: Bone detail substantially degraded by motion at some levels. The T10 compression fracture has been augmented. There are new T8 and T9 moderate compression fractures (series 27, image 77). There is a new nonacute appearing anterior left lower rib fracture with callus on series 25, image 114. Review of the MIP images confirms the above findings. CT ABDOMEN and PELVIS FINDINGS Intermittent motion artifact. Hepatobiliary: Stable and negative. Pancreas:  Negative. Spleen: Negative. Adrenals/Urinary Tract: Adrenal glands and kidneys appear  stable. Renal enhancement and contrast excretion appears symmetric and normal. There are small bilateral benign appearing renal cysts. Urinary bladder detail today is degraded by motion. Stomach/Bowel: Increased retained stool in the large bowel. Redundant sigmoid colon. No large bowel inflammation. Gas and fluid-filled small bowel loops measure at the upper limits of normal, 28 millimeters. Some detail is degraded by motion but there is no small bowel transition identified. The stomach is decompressed.  No free air, free fluid. Vascular/Lymphatic: Aortoiliac calcified atherosclerosis. Major arterial structures appear to remain patent. Grossly patent portal venous system. No lymphadenopathy. Reproductive: Negative. Other: No pelvic free fluid. Musculoskeletal: Bone detail intermittently degraded by motion. Augmented T12, L1, and L2 compression fractures. Mild L3 superior endplate compression since 07/28/2018. Review of the MIP images confirms the above findings. IMPRESSION: 1. Motion degraded CTA of the chest and CT Abdomen and Pelvis. 2. No central pulmonary embolus, but pulmonary artery branch detail is degraded by motion. 3. New compression fractures of T8, T9, and L3 since January which may be pathologic fractures in the setting of prostate cancer. Interval augmentation of the T10, T12, L1 and L2 fractures. 4. No other new abnormality identified in the chest, abdomen, or pelvis. Electronically Signed   By: Genevie Ann M.D.   On: 09/13/2018 02:36   Ct Abdomen Pelvis W Contrast  Result Date: 09/13/2018 CLINICAL DATA:  63 year old male with shortness of breath, possible small bowel obstruction. Metastatic prostate cancer. EXAM: CT ANGIOGRAPHY CHEST CT ABDOMEN AND PELVIS WITH CONTRAST TECHNIQUE: Multidetector CT imaging of the chest was performed using the standard protocol during bolus administration of intravenous contrast.  Multiplanar CT image reconstructions and MIPs were obtained to evaluate the vascular anatomy. Multidetector CT imaging of the abdomen and pelvis was performed using the standard protocol during bolus administration of intravenous contrast. CONTRAST:  12m ISOVUE-370 IOPAMIDOL (ISOVUE-370) INJECTION 76% COMPARISON:  Head CT today reported separately. Chest CTA 08/10/2018. CT Abdomen and Pelvis 07/28/2018. FINDINGS: CTA CHEST FINDINGS Cardiovascular: Good contrast bolus timing in the pulmonary arterial tree. Respiratory motion. No central pulmonary embolus. The upper lobe segmental branches also appear patent. The lower lobe branches are poorly evaluated due to motion. No cardiomegaly or pericardial effusion. Stable mild tortuosity of the thoracic aorta. Mediastinum/Nodes: No lymphadenopathy. Lungs/Pleura: Major airways are patent. Chronic atelectasis or scarring in the left lower lung. No pleural effusion or acute pulmonary opacity. Musculoskeletal: Bone detail substantially degraded by motion at some levels. The T10 compression fracture has been augmented. There are new T8 and T9 moderate compression fractures (series 27, image 77). There is a new nonacute appearing anterior left lower rib fracture with callus on series 25, image 114. Review of the MIP images confirms the above findings. CT ABDOMEN and PELVIS FINDINGS Intermittent motion artifact. Hepatobiliary: Stable and negative. Pancreas: Negative. Spleen: Negative. Adrenals/Urinary Tract: Adrenal glands and kidneys appear stable. Renal enhancement and contrast excretion appears symmetric and normal. There are small bilateral benign appearing renal cysts. Urinary bladder detail today is degraded by motion. Stomach/Bowel: Increased retained stool in the large bowel. Redundant sigmoid colon. No large bowel inflammation. Gas and fluid-filled small bowel loops measure at the upper limits of normal, 28 millimeters. Some detail is degraded by motion but there is no  small bowel transition identified. The stomach is decompressed.  No free air, free fluid. Vascular/Lymphatic: Aortoiliac calcified atherosclerosis. Major arterial structures appear to remain patent. Grossly patent portal venous system. No lymphadenopathy. Reproductive: Negative. Other: No pelvic free fluid. Musculoskeletal: Bone detail intermittently degraded by motion.  Augmented T12, L1, and L2 compression fractures. Mild L3 superior endplate compression since 07/28/2018. Review of the MIP images confirms the above findings. IMPRESSION: 1. Motion degraded CTA of the chest and CT Abdomen and Pelvis. 2. No central pulmonary embolus, but pulmonary artery branch detail is degraded by motion. 3. New compression fractures of T8, T9, and L3 since January which may be pathologic fractures in the setting of prostate cancer. Interval augmentation of the T10, T12, L1 and L2 fractures. 4. No other new abnormality identified in the chest, abdomen, or pelvis. Electronically Signed   By: Genevie Ann M.D.   On: 09/13/2018 02:36   Dg Abdomen Acute W/chest  Result Date: 09/12/2018 CLINICAL DATA:  Right-sided rib pain EXAM: DG ABDOMEN ACUTE W/ 1V CHEST COMPARISON:  08/28/2018 FINDINGS: Single-view chest demonstrates platelike atelectasis in the left mid lung with streaky atelectasis at the left base. Stable cardiomediastinal silhouette. Supine and decubitus views of the abdomen demonstrate no definite free air. Mildly dilated loops of central small bowel measuring up to 3 cm with scattered distal bowel gas noted. Scoliosis of the spine with multiple treated compression fractures. IMPRESSION: 1. Low lung volumes with streaky atelectasis at the left mid to lower lung. 2. Mild air distention of central small bowel with distal gas present, findings could be secondary to ileus or partial bowel obstruction. Electronically Signed   By: Donavan Foil M.D.   On: 09/12/2018 23:44     Labs:   Basic Metabolic Panel: Recent Labs  Lab  09/17/18 0455 09/19/18 0609 09/20/18 0605  NA  --  138 139  K  --  3.1* 4.7  CL  --  107 114*  CO2  --  23 19*  GLUCOSE  --  98 98  BUN  --  11 12  CREATININE 0.57* 0.65 0.67  CALCIUM  --  8.9 9.1   GFR Estimated Creatinine Clearance: 87.2 mL/min (by C-G formula based on SCr of 0.67 mg/dL). Liver Function Tests: Recent Labs  Lab 09/19/18 0609 09/20/18 0605  AST 45* 36  ALT 67* 58*  ALKPHOS 107 103  BILITOT 1.1 1.0  PROT 5.7* 5.4*  ALBUMIN 3.3* 3.1*   No results for input(s): LIPASE, AMYLASE in the last 168 hours. Recent Labs  Lab 09/19/18 0609  AMMONIA 14   Coagulation profile No results for input(s): INR, PROTIME in the last 168 hours.  CBC: No results for input(s): WBC, NEUTROABS, HGB, HCT, MCV, PLT in the last 168 hours. Cardiac Enzymes: No results for input(s): CKTOTAL, CKMB, CKMBINDEX, TROPONINI in the last 168 hours. BNP: Invalid input(s): POCBNP CBG: No results for input(s): GLUCAP in the last 168 hours. D-Dimer No results for input(s): DDIMER in the last 72 hours. Hgb A1c No results for input(s): HGBA1C in the last 72 hours. Lipid Profile No results for input(s): CHOL, HDL, LDLCALC, TRIG, CHOLHDL, LDLDIRECT in the last 72 hours. Thyroid function studies No results for input(s): TSH, T4TOTAL, T3FREE, THYROIDAB in the last 72 hours.  Invalid input(s): FREET3 Anemia work up No results for input(s): VITAMINB12, FOLATE, FERRITIN, TIBC, IRON, RETICCTPCT in the last 72 hours. Microbiology Recent Results (from the past 240 hour(s))  Urine culture     Status: Abnormal   Collection Time: 09/12/18 10:55 PM  Result Value Ref Range Status   Specimen Description   Final    URINE, CLEAN CATCH Performed at [facility], [place] [address] [place], [place] [address]    Special Requests   Final  NONE Performed at [facility], [place] [address], [place], [place] [address]    Culture >=100,000 COLONIES/mL ENTEROCOCCUS  FAECALIS (A)  Final   Report Status 09/15/2018 FINAL  Final   Organism ID, Bacteria ENTEROCOCCUS FAECALIS (A)  Final      Susceptibility   Enterococcus faecalis - MIC*    AMPICILLIN <=2 SENSITIVE Sensitive     LEVOFLOXACIN 1 SENSITIVE Sensitive     NITROFURANTOIN <=16 SENSITIVE Sensitive     VANCOMYCIN 2 SENSITIVE Sensitive     * >=100,000 COLONIES/mL ENTEROCOCCUS FAECALIS  Surgical PCR screen     Status: None   Collection Time: 09/14/18  2:45 PM  Result Value Ref Range Status   MRSA, PCR NEGATIVE NEGATIVE Final   Staphylococcus aureus NEGATIVE NEGATIVE Final    Comment: (NOTE) The Xpert SA Assay (FDA approved for NASAL specimens in patients 9 years of age and older), is one component of a comprehensive surveillance program. It is not intended to diagnose infection nor to guide or monitor treatment. Performed at [facility], [place] [address], [place], [place] [address]      Discharge Instructions:   Discharge Instructions    Diet - low sodium heart healthy   Complete by:  As directed    Diet - low sodium heart healthy   Complete by:  As directed    Increase activity slowly   Complete by:  As directed    Increase activity slowly   Complete by:  As directed      Allergies as of 09/22/2018      Reactions   Amlodipine Swelling   edema   Tetracycline Rash      Medication List    STOP taking these medications   ipratropium-albuterol 0.5-2.5 (3) MG/3ML Soln Commonly known as:  DUONEB   morphine 30 MG 12 hr tablet Commonly known as:  MS CONTIN     TAKE these medications   abiraterone acetate 250 MG tablet Commonly known as:  ZYTIGA Take 1,000 mg by mouth daily.   albuterol (2.5 MG/3ML) 0.083% nebulizer solution Commonly known as:  PROVENTIL Take 3 mLs (2.5 mg total) by nebulization every 6 (six) hours as needed for wheezing or shortness of breath. What changed:  Another medication with the same name was removed. Continue taking this  medication, and follow the directions you see here.   azelastine 0.1 % nasal spray Commonly known as:  ASTELIN Place 2 sprays into both nostrils every morning. What changed:    when to take this  reasons to take this   CALCIUM-VITAMIN D PO Take 1 tablet by mouth 2 (two) times daily.   carvedilol 3.125 MG tablet Commonly known as:  COREG Take 1 tablet (3.125 mg total) by mouth 2 (two) times daily with a meal.   cyanocobalamin 1000 MCG/ML injection Commonly known as:  (VITAMIN B-12) Inject 1,000 mcg into the muscle every 30 (thirty) days.   cyclobenzaprine 5 MG tablet Commonly known as:  FLEXERIL Take 1 tablet (5 mg total) by mouth 3 (three) times daily as needed for up to 4 days for muscle spasms.   diclofenac sodium 1 % Gel Commonly known as:  VOLTAREN Apply 4 g topically 4 (four) times daily.   docusate sodium 100 MG capsule Commonly known as:  COLACE Take 100 mg by mouth 2 (two) times daily.   gabapentin 300 MG capsule Commonly known as:  NEURONTIN Take 1 capsule (300 mg total) by mouth at bedtime for 3 days.  LUPRON IJ Inject 1 Dose as directed every 4 (four) months. This is administered by [facility]   magnesium citrate Soln Take 1 Bottle by mouth as needed for severe constipation.   methocarbamol 500 MG tablet Commonly known as:  ROBAXIN Take 1 tablet (500 mg total) by mouth 2 (two) times daily for 3 days.   MIRALAX PO Take 1 Dose by mouth daily as needed (constipation).   mometasone 50 MCG/ACT nasal spray Commonly known as:  NASONEX USE 1 TO 2 SPRAYS IN EACH NOSTRIL ONCE DAILY. What changed:    how much to take  how to take this  when to take this  reasons to take this  additional instructions   NARCAN 4 MG/0.1ML Liqd nasal spray kit Generic drug:  naloxone Place 0.4 mg into the nose once.   olmesartan 40 MG tablet Commonly known as:  BENICAR TAKE 1 TABLET DAILY   oxyCODONE 5 MG immediate release tablet Commonly known as:  Oxy  IR/ROXICODONE Take 1 tablet (5 mg total) by mouth every 6 (six) hours as needed for up to 5 days for severe pain (may take 1-2 tablets every 4 hours). What changed:  when to take this   phenazopyridine 200 MG tablet Commonly known as:  PYRIDIUM Take 1 tablet (200 mg total) by mouth 3 (three) times daily with meals for 5 days.   predniSONE 5 MG tablet Commonly known as:  DELTASONE Take 1 tablet (5 mg total) by mouth daily with breakfast. What changed:    how much to take  when to take this  additional instructions   QUEtiapine 25 MG tablet Commonly known as:  SEROQUEL Take 1 tablet (25 mg total) by mouth at bedtime for 3 days.   SYMBICORT 160-4.5 MCG/ACT inhaler Generic drug:  budesonide-formoterol Inhale 2 puffs into the lungs 2 (two) times daily. What changed:  See the new instructions.   SYRINGE-NEEDLE (DISP) 3 ML 3 ML Misc Use to inject b12 monthly.   tamsulosin 0.4 MG Caps capsule Commonly known as:  FLOMAX Take 1 capsule (0.4 mg total) by mouth daily.            Durable Medical Equipment  (From admission, onward)         Start     Ordered   09/13/18 1520  For home use only DME lightweight manual wheelchair with seat cushion  Once    Comments:  Patient suffers from Pathological fractures of the spine which impairs their ability to perform daily activities like ADL'S  in the home.  A WALKING AID will not resolve  issue with performing activities of daily living. A wheelchair will allow patient to safely perform daily activities. Patient is not able to propel themselves in the home using a standard weight wheelchair due to [place]. Patient can self propel in the lightweight wheelchair.  Accessories: elevating leg rests (ELRs), wheel locks, extensions and anti-tippers.   09/13/18 1523            Time coordinating discharge: 39 minutes  Signed:  Tuwanna Krausz  [facility] 09/22/2018, 2:39 PM

## 2018-09-22 MED ORDER — LEUPROLIDE ACETATE 7.5 MG IM KIT
7.5000 mg | PACK | Freq: Once | INTRAMUSCULAR | Status: AC
  Administered 2018-09-22: 7.5 mg via INTRAMUSCULAR
  Filled 2018-09-22: qty 7.5

## 2018-09-22 NOTE — Progress Notes (Signed)
Clinical Social Worker facilitated patient discharge including contacting patient family and facility to confirm patient discharge plans.  Clinical information faxed to facility and family agreeable with plan.  CSW arranged ambulance transport via [facility] to [facility] .  RN to call [phone] (rm#[id number]) for report prior to discharge.  Clinical Social Worker will sign off for now as social work intervention is no longer needed. Please consult [place] again if new need arises.  [name], MSW, [place]

## 2018-09-22 NOTE — Progress Notes (Signed)
Spoke with Judson Roch at [facility] SNF) and she is working with pt's family on admission steps. Will advise CSW when able to proceed.  Sharren Bridge, MSW, LCSW Clinical Social Work 09/22/2018 [phone] coverage for [phone]

## 2018-09-22 NOTE — Progress Notes (Signed)
Patient seen and examined this morning.  Patient was planned for discharge yesterday but unable to go because of family not being able to make a decision on placement.  No new change in last 24 hours. Okay to discharge to SNF today.

## 2018-09-22 NOTE — Progress Notes (Signed)
Patient ID: [name] Holder, male   DOB: 03/08/1956, 63 y.o.   MRN: [id number]  7 Days Post-Op Subjective: Pt with continued frequency.  Has condom catheter now to help manage symptoms.  On Pyridium and tamsulosin for symptomatic relief.  Objective: Vital signs in last 24 hours: Temp:  [97.6 F (36.4 C)-97.8 F (36.6 C)] 97.6 F (36.4 C) (02/27 0609) Pulse Rate:  [79-95] 79 (02/27 0609) Resp:  [16-22] 16 (02/27 0609) BP: (107-131)/(64-81) 107/64 (02/27 0609) SpO2:  [96 %-99 %] 98 % (02/27 0820)  Intake/Output from previous day: 02/26 0701 - 02/27 0700 In: -  Out: 400 [Urine:400] Intake/Output this shift: Total I/O In: 360 [P.O.:360] Out: -   Physical Exam:  General: Alert and oriented GU: Urine oragne  Lab Results: No results for input(s): HGB, HCT in the last 72 hours. BMET Recent Labs    09/20/18 0605  NA 139  K 4.7  CL 114*  CO2 19*  GLUCOSE 98  BUN 12  CREATININE 0.67  CALCIUM 9.1     Studies/Results: No results found.  Assessment/Plan: 1) CRPC: Pt was due to receive Lupron in office tomorrow.  Will administer 7.5 mg Lupron (one month) today so that he can delay his OV for one month to continue outpatient treatment.  Continue abiraterone and prednisone. F/U with Dr. Alen Blew as planned also.  2) Bladder cancer: Will schedule for outpatient cystoscopy in about 3 months.  3) LUTS: Secondary to recent TURBT.  Symptoms should improve with time.    LOS: 9 days   Dutch Gray 09/22/2018, 12:13 PM

## 2018-09-22 NOTE — Clinical Social Work Placement (Signed)
   CLINICAL SOCIAL WORK PLACEMENT  NOTE  Date:  09/22/2018  Patient Details  Name: [name] Holder MRN: [id number] Date of Birth: 1955/08/25  Clinical Social Work is seeking post-discharge placement for this patient at the [place] level of care (*CSW will initial, date and re-position this form in  chart as items are completed):  Yes   Patient/family provided with [place] Work Department's list of facilities offering this level of care within the geographic area requested by the patient (or if unable, by the patient's family).  Yes   Patient/family informed of their freedom to choose among providers that offer the needed level of care, that participate in Medicare, Medicaid or managed care program needed by the patient, have an available bed and are willing to accept the patient.  Yes   Patient/family informed of [place] [facility]'s ownership interest in [facility] and [facility], as well as of the fact that they are under no obligation to receive care at these facilities.  PASRR submitted to EDS on       PASRR number received on       Existing PASRR number confirmed on 09/22/18     FL2 transmitted to all facilities in geographic area requested by pt/family on       FL2 transmitted to all facilities within larger geographic area on       Patient informed that his/her managed care company has contracts with or will negotiate with certain facilities, including the following:            Patient/family informed of bed offers received.  Patient chooses bed at [facility]     Physician recommends and patient chooses bed at      Patient to be transferred to [facility] on 09/22/18.  Patient to be transferred to facility by ptar     Patient family notified on 09/22/18 of transfer.  Name of family member notified:  spoke with spouse      PHYSICIAN       Additional Comment:     _______________________________________________ Wende Neighbors, LCSW 09/22/2018, 1:49 PM

## 2018-10-11 ENCOUNTER — Inpatient Hospital Stay (HOSPITAL_BASED_OUTPATIENT_CLINIC_OR_DEPARTMENT_OTHER): Payer: BLUE CROSS/BLUE SHIELD | Admitting: Oncology

## 2018-10-11 ENCOUNTER — Telehealth: Payer: Self-pay | Admitting: Oncology

## 2018-10-11 ENCOUNTER — Other Ambulatory Visit: Payer: Self-pay

## 2018-10-11 ENCOUNTER — Inpatient Hospital Stay: Payer: BLUE CROSS/BLUE SHIELD | Attending: Oncology

## 2018-10-11 VITALS — BP 141/85 | HR 83 | Temp 98.6°F | Resp 18 | Ht 67.0 in | Wt 167.6 lb

## 2018-10-11 DIAGNOSIS — C61 Malignant neoplasm of prostate: Secondary | ICD-10-CM | POA: Diagnosis not present

## 2018-10-11 DIAGNOSIS — M4854XA Collapsed vertebra, not elsewhere classified, thoracic region, initial encounter for fracture: Secondary | ICD-10-CM | POA: Insufficient documentation

## 2018-10-11 DIAGNOSIS — Z79899 Other long term (current) drug therapy: Secondary | ICD-10-CM | POA: Insufficient documentation

## 2018-10-11 DIAGNOSIS — M4850XA Collapsed vertebra, not elsewhere classified, site unspecified, initial encounter for fracture: Secondary | ICD-10-CM

## 2018-10-11 DIAGNOSIS — M810 Age-related osteoporosis without current pathological fracture: Secondary | ICD-10-CM

## 2018-10-11 DIAGNOSIS — Z8546 Personal history of malignant neoplasm of prostate: Secondary | ICD-10-CM

## 2018-10-11 DIAGNOSIS — K5900 Constipation, unspecified: Secondary | ICD-10-CM | POA: Diagnosis not present

## 2018-10-11 LAB — CMP (CANCER CENTER ONLY)
ALT: 20 U/L (ref 0–44)
AST: 18 U/L (ref 15–41)
Albumin: 3.4 g/dL — ABNORMAL LOW (ref 3.5–5.0)
Alkaline Phosphatase: 133 U/L — ABNORMAL HIGH (ref 38–126)
Anion gap: 9 (ref 5–15)
BUN: 9 mg/dL (ref 8–23)
CO2: 23 mmol/L (ref 22–32)
Calcium: 9.4 mg/dL (ref 8.9–10.3)
Chloride: 105 mmol/L (ref 98–111)
Creatinine: 0.74 mg/dL (ref 0.61–1.24)
GFR, Est AFR Am: >60 mL/min (ref >60)
GFR, Estimated: >60 mL/min (ref >60)
Glucose, Bld: 121 mg/dL — ABNORMAL HIGH (ref 70–99)
Potassium: 4.6 mmol/L (ref 3.5–5.1)
Sodium: 137 mmol/L (ref 135–145)
Total Bilirubin: 0.5 mg/dL (ref 0.3–1.2)
Total Protein: 6.1 g/dL — ABNORMAL LOW (ref 6.5–8.1)

## 2018-10-11 LAB — CBC WITH DIFFERENTIAL (CANCER CENTER ONLY)
Abs Immature Granulocytes: 0.03 10*3/uL (ref 0.00–0.07)
Basophils Absolute: 0 10*3/uL (ref 0.0–0.1)
Basophils Relative: 0 %
Eosinophils Absolute: 0 10*3/uL (ref 0.0–0.5)
Eosinophils Relative: 0 %
HCT: 35.6 % — ABNORMAL LOW (ref 39.0–52.0)
Hemoglobin: 11.5 g/dL — ABNORMAL LOW (ref 13.0–17.0)
Immature Granulocytes: 0 %
Lymphocytes Relative: 16 %
Lymphs Abs: 1.3 10*3/uL (ref 0.7–4.0)
MCH: 30.8 pg (ref 26.0–34.0)
MCHC: 32.3 g/dL (ref 30.0–36.0)
MCV: 95.4 fL (ref 80.0–100.0)
Monocytes Absolute: 0.6 10*3/uL (ref 0.1–1.0)
Monocytes Relative: 8 %
Neutro Abs: 6 10*3/uL (ref 1.7–7.7)
Neutrophils Relative %: 76 %
Platelet Count: 255 10*3/uL (ref 150–400)
RBC: 3.73 MIL/uL — ABNORMAL LOW (ref 4.22–5.81)
RDW: 13 % (ref 11.5–15.5)
WBC Count: 8 10*3/uL (ref 4.0–10.5)
nRBC: 0 % (ref 0.0–0.2)

## 2018-10-11 MED ORDER — OXYCODONE HCL 5 MG PO TABS: 5.0000 mg | ORAL_TABLET | ORAL | 0 refills | Status: DC | PRN

## 2018-10-11 MED ORDER — SENNOSIDES-DOCUSATE SODIUM 8.6-50 MG PO TABS: 1.0000 | ORAL_TABLET | Freq: Two times a day (BID) | ORAL | 3 refills | Status: DC

## 2018-10-11 NOTE — Telephone Encounter (Signed)
Scheduled appt per 3/17 los.  Printed avs.

## 2018-10-11 NOTE — Progress Notes (Signed)
Hematology and Oncology Follow Up Visit  [name] Holder [id number] 1955/10/25 63 y.o. 10/11/2018 3:46 PM [name] Holder, MDJones, [name], MD   Principle Diagnosis: 63 year old man with prostate cancer diagnosed in 2014.  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  Prior Therapy:  Androgen deprivation therapy in 2014.  He developed castration resistant in 2018.  He is status post T10, T12, kyphoplasty completed on January 22 of 2020.  Current therapy:  Zytiga 1000 mg daily with prednisone at 5 mg daily started in 2018.  Interim History: [name] Holder returns today for a follow-up visit.  Since her last visit, he was hospitalized in February 2020 for delirium associated with morphine.  He has also poor tolerance to fentanyl patch and was discharged on oxycodone as needed.  He was discharged to a skilled nursing facility and subsequently has been back home.  Oxycodone has been managing his pain without the need for long-acting pain medication at this time.  He has been using it 24-6 times a day.  He has not reported any worsening pain exacerbation or changing in his mentation.  His hallucination has improved.  He is complaining of periodic constipation at times that requires stool softeners and laxatives.   Patient denied any alteration mental status, neuropathy, confusion or dizziness.  Denies any headaches or lethargy.  Denies any night sweats, weight loss or changes in appetite.  Denied orthopnea, dyspnea on exertion or chest discomfort.  Denies shortness of breath, difficulty breathing hemoptysis or cough.  Denies any abdominal distention, nausea, early satiety or dyspepsia.  Denies any hematuria, frequency, dysuria or nocturia.  Denies any skin irritation, dryness or rash.  Denies any ecchymosis or petechiae.  Denies any lymphadenopathy or clotting.  Denies any heat or cold intolerance.  Denies any anxiety or depression.  Remaining review of system is  negative.    Medications: I have reviewed the patient's current medications.  Current Outpatient Medications  Medication Sig Dispense Refill  . abiraterone acetate (ZYTIGA) 250 MG tablet Take 1,000 mg by mouth daily.   10  . albuterol (PROVENTIL) (2.5 MG/3ML) 0.083% nebulizer solution Take 3 mLs (2.5 mg total) by nebulization every 6 (six) hours as needed for wheezing or shortness of breath. 150 mL 1  . azelastine (ASTELIN) 0.1 % nasal spray Place 2 sprays into both nostrils every morning. (Patient taking differently: Place 2 sprays into both nostrils daily as needed for allergies. )    . CALCIUM-VITAMIN D PO Take 1 tablet by mouth 2 (two) times daily.     . carvedilol (COREG) 3.125 MG tablet Take 1 tablet (3.125 mg total) by mouth 2 (two) times daily with a meal. 180 tablet 0  . cyanocobalamin (,VITAMIN B-12,) 1000 MCG/ML injection Inject 1,000 mcg into the muscle every 30 (thirty) days.   4  . diclofenac sodium (VOLTAREN) 1 % GEL Apply 4 g topically 4 (four) times daily.    [name] docusate sodium (COLACE) 100 MG capsule Take 100 mg by mouth 2 (two) times daily.     [name] gabapentin (NEURONTIN) 300 MG capsule Take 1 capsule (300 mg total) by mouth at bedtime for 3 days. 3 capsule 0  . Leuprolide Acetate (LUPRON IJ) Inject 1 Dose as directed every 4 (four) months. This is administered by [facility]    . magnesium citrate SOLN Take 1 Bottle by mouth as needed for severe constipation.    . mometasone (NASONEX) 50 MCG/ACT nasal spray USE 1 TO 2 SPRAYS IN [facility]  NOSTRIL ONCE DAILY. (Patient taking differently: Place 2 sprays into the nose daily as needed (allergies). ) 17 g 5  . NARCAN 4 MG/0.1ML LIQD nasal spray kit Place 0.4 mg into the nose once.     [name] olmesartan (BENICAR) 40 MG tablet TAKE 1 TABLET DAILY 90 tablet 0  . Polyethylene Glycol 3350 (MIRALAX PO) Take 1 Dose by mouth daily as needed (constipation).     . predniSONE (DELTASONE) 5 MG tablet Take 1 tablet (5 mg total) by mouth daily with  breakfast.    . QUEtiapine (SEROQUEL) 25 MG tablet Take 1 tablet (25 mg total) by mouth at bedtime for 3 days. 3 tablet 0  . SYMBICORT 160-4.5 MCG/ACT inhaler Inhale 2 puffs into the lungs 2 (two) times daily. (Patient taking differently: Inhale 2 puffs into the lungs 2 (two) times daily. ) 30.6 g 1  . SYRINGE-NEEDLE, DISP, 3 ML 3 ML MISC Use to inject b12 monthly. 12 each 0  . tamsulosin (FLOMAX) 0.4 MG CAPS capsule Take 1 capsule (0.4 mg total) by mouth daily. 30 capsule    No current facility-administered medications for this visit.      Allergies:  Allergies  Allergen Reactions  . Amlodipine Swelling    edema  . Tetracycline Rash    Past Medical History, Surgical history, Social history, and Family History were reviewed and updated.   Physical Exam: Blood pressure (!) 141/85, pulse 83, temperature 98.6 F (37 C), temperature source Oral, resp. rate 18, height '5\' 7"'$  (1.702 m), weight 167 lb 9.6 oz (76 kg), SpO2 100 %.   ECOG: 1   General appearance: Alert, awake without any distress. Head: Atraumatic without abnormalities Oropharynx: Without any thrush or ulcers. Eyes: No scleral icterus. Lymph nodes: No lymphadenopathy noted in the cervical, supraclavicular, or axillary nodes Heart:regular rate and rhythm, without any murmurs or gallops.   Lung: Clear to auscultation without any rhonchi, wheezes or dullness to percussion. Abdomin: Soft, nontender without any shifting dullness or ascites. Musculoskeletal: No clubbing or cyanosis. Neurological: No motor or sensory deficits. Skin: No rashes or lesions.      Lab Results: Lab Results  Component Value Date   WBC 6.7 09/14/2018   HGB 11.4 (L) 09/14/2018   HCT 35.0 (L) 09/14/2018   MCV 98.6 09/14/2018   PLT 202 09/14/2018     Chemistry      Component Value Date/Time   NA 139 09/20/2018 0605   K 4.7 09/20/2018 0605   CL 114 (H) 09/20/2018 0605   CO2 19 (L) 09/20/2018 0605   BUN 12 09/20/2018 0605   CREATININE  0.67 09/20/2018 0605      Component Value Date/Time   CALCIUM 9.1 09/20/2018 0605   ALKPHOS 103 09/20/2018 0605   AST 36 09/20/2018 0605   ALT 58 (H) 09/20/2018 0605   BILITOT 1.0 09/20/2018 0605     Results for MATTHEU, BRODERSEN (MRN [id number]) as of 10/11/2018 15:47  Ref. Range 09/14/2018 13:57  Prostatic Specific Antigen Latest Ref Range: 0.00 - 4.00 ng/mL 0.48    Impression and Plan:  63 year old man with:  1.    Castration-resistant prostate cancer documented in 2018.  He was initially diagnosed with advanced prostate cancer 2014.  He remains on Zytiga without any major complications.  His PSA remains very low in February 2020.  The natural course of his disease and alternative treatment options were discussed including systemic chemotherapy and Xofigo if he develops worsening progression in the future.  We will continue  to monitor him clinically for the time being.  2.  Compression fracture tween T10 and T12.    Related to osteoporosis without any evidence of malignancy.   3.  Bone directed therapy: I recommended calcium and vitamin D supplement with consideration for Prolia in the near future.  He has follow-up with Dr. Alinda Money in few weeks.  4.  Pain: Manageable with the current oxycodone regimen which is refilled for him.  5.  Prognosis and goals of care: He has incurable disease although his prostate cancer is under reasonable control at this time and his performance status is adequate.  6.  Constipation: I asked him to take Senokot-S twice a day he has also MiraLAX at home which will be added if not able to get daily bowel movements.  We also discussed strategies to improve his fluid intake as well as fiber intake.  7.  Follow-up: Be in 3 months.   25  minutes was spent with the patient face-to-face today.  More than 50% of time was dedicated to  reviewing his disease status, treatment options and answering questions regarding complications to therapy.       Zola Button, MD 3/17/20203:46 PM

## 2018-10-12 ENCOUNTER — Telehealth: Payer: Self-pay | Admitting: *Deleted

## 2018-10-12 LAB — PROSTATE-SPECIFIC AG, SERUM (LABCORP): Prostate Specific Ag, Serum: 0.3 ng/mL (ref 0.0–4.0)

## 2018-10-12 NOTE — Telephone Encounter (Signed)
Attempted to contact patient per Dr Alen Blew to relay PSA results.  Attempted twice to call patient both attempts someone picked up line but no answer.   Unable to leave message.

## 2018-10-12 NOTE — Telephone Encounter (Signed)
-----   Message from Wyatt Portela, MD sent at 10/12/2018  8:07 AM EDT ----- Please let him know his PSA is still low.

## 2018-11-07 ENCOUNTER — Other Ambulatory Visit: Payer: Self-pay | Admitting: Internal Medicine

## 2018-11-07 DIAGNOSIS — I10 Essential (primary) hypertension: Secondary | ICD-10-CM

## 2018-11-18 ENCOUNTER — Other Ambulatory Visit: Payer: Self-pay | Admitting: Oncology

## 2018-11-18 MED ORDER — OXYCODONE HCL 5 MG PO TABS: 5.0000 mg | ORAL_TABLET | ORAL | 0 refills | Status: DC | PRN

## 2018-11-24 ENCOUNTER — Encounter: Payer: Self-pay | Admitting: Internal Medicine

## 2018-11-24 ENCOUNTER — Ambulatory Visit (INDEPENDENT_AMBULATORY_CARE_PROVIDER_SITE_OTHER): Payer: BLUE CROSS/BLUE SHIELD | Admitting: Internal Medicine

## 2018-11-24 VITALS — BP 120/80

## 2018-11-24 DIAGNOSIS — Z9109 Other allergy status, other than to drugs and biological substances: Secondary | ICD-10-CM

## 2018-11-24 DIAGNOSIS — J4521 Mild intermittent asthma with (acute) exacerbation: Secondary | ICD-10-CM

## 2018-11-24 DIAGNOSIS — R739 Hyperglycemia, unspecified: Secondary | ICD-10-CM | POA: Diagnosis not present

## 2018-11-24 DIAGNOSIS — J301 Allergic rhinitis due to pollen: Secondary | ICD-10-CM

## 2018-11-24 DIAGNOSIS — I10 Essential (primary) hypertension: Secondary | ICD-10-CM

## 2018-11-24 DIAGNOSIS — D539 Nutritional anemia, unspecified: Secondary | ICD-10-CM | POA: Diagnosis not present

## 2018-11-24 DIAGNOSIS — J4520 Mild intermittent asthma, uncomplicated: Secondary | ICD-10-CM | POA: Diagnosis not present

## 2018-11-24 DIAGNOSIS — Z0000 Encounter for general adult medical examination without abnormal findings: Secondary | ICD-10-CM

## 2018-11-24 MED ORDER — IPRATROPIUM-ALBUTEROL 0.5-2.5 (3) MG/3ML IN SOLN: 3.0000 mL | RESPIRATORY_TRACT | 1 refills | Status: DC | PRN

## 2018-11-24 MED ORDER — ALBUTEROL SULFATE HFA 108 (90 BASE) MCG/ACT IN AERS: INHALATION_SPRAY | RESPIRATORY_TRACT | 5 refills | Status: DC

## 2018-11-24 MED ORDER — BUDESONIDE-FORMOTEROL FUMARATE 160-4.5 MCG/ACT IN AERO: 2.0000 | INHALATION_SPRAY | Freq: Two times a day (BID) | RESPIRATORY_TRACT | 1 refills | Status: DC

## 2018-11-24 MED ORDER — ALBUTEROL SULFATE (2.5 MG/3ML) 0.083% IN NEBU: 2.5000 mg | INHALATION_SOLUTION | Freq: Four times a day (QID) | RESPIRATORY_TRACT | 1 refills | Status: DC | PRN

## 2018-11-24 MED ORDER — MOMETASONE FUROATE 50 MCG/ACT NA SUSP: 2.0000 | Freq: Every day | NASAL | 1 refills | Status: DC

## 2018-11-24 NOTE — Progress Notes (Signed)
Virtual Visit via Video Note  I connected with [name] Holder on 11/24/18 at 10:30 AM EDT by a video enabled telemedicine application and verified that I am speaking with the correct person using two identifiers.   I discussed the limitations of evaluation and management by telemedicine and the availability of in person appointments. The patient expressed understanding and agreed to proceed.  History of Present Illness: He checked in for a virtual visit.  He was not willing to come in due to the COVID-19 pandemic.  His wife accompanied him on the visit today.  She is an Therapist, sports.  He complains of a 1 to 2-week history of orthostatic dizziness.  His wife has checked his vital signs and she says that lying down his blood pressure is 120/80, sitting it is  100/70, and standing up it is down to 100/66.  She tells me he is compliant with carvedilol and olmesartan.  She says his pulse ox was 95% today.  He has also had a few episodes of NP cough and shortness of breath.   He requests a refill on his asthma inhalers.  He was admitted to a little over a month ago for altered mental status and intractable pain.  During the admission he was found to be anemic.  He is taking a multiple vitamin but no other vitamin supplements.  Is controlling the back pain with oxycodone. He has mild constipation that is adequately treated with Dulcolax.  He has lost about 50 pounds over the last few months but he tells me his appetite has been good.  He denies nausea, vomiting, diarrhea, or diaphoresis.  He continues to receive a monthly B12 injection.    Observations/Objective: On the video feed he looked pale to me.  There was no evidence of distress though.  He was calm, cooperative, and appropriate.  He was accompanied by his wife.  Lab Results  Component Value Date   WBC 8.0 10/11/2018   HGB 11.5 (L) 10/11/2018   HCT 35.6 (L) 10/11/2018   PLT 255 10/11/2018   GLUCOSE 121 (H) 10/11/2018   CHOL 162 09/02/2017   TRIG 42.0  09/02/2017   HDL 93.90 09/02/2017   LDLCALC 59 09/02/2017   ALT 20 10/11/2018   AST 18 10/11/2018   NA 137 10/11/2018   K 4.6 10/11/2018   CL 105 10/11/2018   CREATININE 0.74 10/11/2018   BUN 9 10/11/2018   CO2 23 10/11/2018   TSH 1.666 09/12/2018   PSA 0.52 04/27/2018   INR 0.94 08/26/2018   HGBA1C 5.7 09/02/2017     Assessment and Plan: He has symptomatic hypotension so I asked him to stop taking olmesartan.  If his blood pressure does not normalize after that then we will start tapering the carvedilol.  He will also come in to recheck his H&H to see if this is contributing to the hypotension and orthostatic symptoms.  I will screen him for vitamin deficiencies as well.  I sent in prescriptions to treat the nasal allergies and the asthma.   Follow Up Instructions: He agrees to the above recommendations.  He will let me know if he develops any new or worsening symptoms.  He and his wife will continue to monitor his blood pressure.    I discussed the assessment and treatment plan with the patient. The patient was provided an opportunity to ask questions and all were answered. The patient agreed with the plan and demonstrated an understanding of the instructions.   The patient  was advised to call back or seek an in-person evaluation if the symptoms worsen or if the condition fails to improve as anticipated.  I provided 25 minutes of non-face-to-face time during this encounter.   Scarlette Calico, MD

## 2018-11-25 ENCOUNTER — Other Ambulatory Visit (INDEPENDENT_AMBULATORY_CARE_PROVIDER_SITE_OTHER): Payer: BLUE CROSS/BLUE SHIELD

## 2018-11-25 DIAGNOSIS — D539 Nutritional anemia, unspecified: Secondary | ICD-10-CM

## 2018-11-25 DIAGNOSIS — R739 Hyperglycemia, unspecified: Secondary | ICD-10-CM | POA: Diagnosis not present

## 2018-11-25 DIAGNOSIS — I10 Essential (primary) hypertension: Secondary | ICD-10-CM

## 2018-11-25 LAB — CBC WITH DIFFERENTIAL/PLATELET
Basophils Absolute: 0 10*3/uL (ref 0.0–0.1)
Basophils Relative: 0.5 % (ref 0.0–3.0)
Eosinophils Absolute: 0.1 10*3/uL (ref 0.0–0.7)
Eosinophils Relative: 1.4 % (ref 0.0–5.0)
HCT: 38.7 % — ABNORMAL LOW (ref 39.0–52.0)
Hemoglobin: 13.3 g/dL (ref 13.0–17.0)
Lymphocytes Relative: 23.9 % (ref 12.0–46.0)
Lymphs Abs: 2.2 10*3/uL (ref 0.7–4.0)
MCHC: 34.4 g/dL (ref 30.0–36.0)
MCV: 89.6 fl (ref 78.0–100.0)
Monocytes Absolute: 0.7 10*3/uL (ref 0.1–1.0)
Monocytes Relative: 7.3 % (ref 3.0–12.0)
Neutro Abs: 6.2 10*3/uL (ref 1.4–7.7)
Neutrophils Relative %: 66.9 % (ref 43.0–77.0)
Platelets: 313 10*3/uL (ref 150.0–400.0)
RBC: 4.32 Mil/uL (ref 4.22–5.81)
RDW: 14.5 % (ref 11.5–15.5)
WBC: 9.3 10*3/uL (ref 4.0–10.5)

## 2018-11-25 LAB — FOLATE: Folate: 21.2 ng/mL (ref >5.9)

## 2018-11-25 LAB — BASIC METABOLIC PANEL
BUN: 18 mg/dL (ref 6–23)
CO2: 26 mEq/L (ref 19–32)
Calcium: 9.1 mg/dL (ref 8.4–10.5)
Chloride: 98 mEq/L (ref 96–112)
Creatinine, Ser: 0.64 mg/dL (ref 0.40–1.50)
GFR: 125.57 mL/min (ref >60.00)
Glucose, Bld: 87 mg/dL (ref 70–99)
Potassium: 4.1 mEq/L (ref 3.5–5.1)
Sodium: 131 mEq/L — ABNORMAL LOW (ref 135–145)

## 2018-11-25 LAB — IBC PANEL
Iron: 81 ug/dL (ref 42–165)
Saturation Ratios: 27.2 % (ref 20.0–50.0)
Transferrin: 213 mg/dL (ref 212.0–360.0)

## 2018-11-25 LAB — FERRITIN: Ferritin: 351.5 ng/mL — ABNORMAL HIGH (ref 22.0–322.0)

## 2018-11-25 LAB — HEMOGLOBIN A1C: Hgb A1c MFr Bld: 5.1 % (ref 4.6–6.5)

## 2018-11-25 LAB — VITAMIN B12: Vitamin B-12: 482 pg/mL (ref 211–911)

## 2018-11-28 ENCOUNTER — Encounter: Payer: Self-pay | Admitting: Internal Medicine

## 2018-11-28 ENCOUNTER — Other Ambulatory Visit: Payer: Self-pay | Admitting: Internal Medicine

## 2018-11-28 DIAGNOSIS — E871 Hypo-osmolality and hyponatremia: Secondary | ICD-10-CM | POA: Insufficient documentation

## 2018-11-28 DIAGNOSIS — I951 Orthostatic hypotension: Secondary | ICD-10-CM | POA: Insufficient documentation

## 2018-11-28 LAB — RETICULOCYTES
ABS Retic: 108940 cells/uL — ABNORMAL HIGH (ref 25000–9000)
Retic Ct Pct: 2.6 %

## 2018-11-28 LAB — VITAMIN B1: Vitamin B1 (Thiamine): 20 nmol/L (ref 8–30)

## 2018-11-28 MED ORDER — FLUDROCORTISONE ACETATE 0.1 MG PO TABS: 0.1000 mg | ORAL_TABLET | Freq: Every day | ORAL | 0 refills | Status: DC

## 2018-12-02 ENCOUNTER — Other Ambulatory Visit: Payer: Self-pay | Admitting: Internal Medicine

## 2018-12-02 DIAGNOSIS — I10 Essential (primary) hypertension: Secondary | ICD-10-CM

## 2018-12-12 ENCOUNTER — Encounter: Payer: Self-pay | Admitting: Family

## 2018-12-12 ENCOUNTER — Ambulatory Visit: Payer: Self-pay | Admitting: Internal Medicine

## 2018-12-12 ENCOUNTER — Ambulatory Visit (INDEPENDENT_AMBULATORY_CARE_PROVIDER_SITE_OTHER): Payer: BLUE CROSS/BLUE SHIELD | Admitting: Family

## 2018-12-12 DIAGNOSIS — R42 Dizziness and giddiness: Secondary | ICD-10-CM | POA: Diagnosis not present

## 2018-12-12 MED ORDER — MECLIZINE HCL 12.5 MG PO TABS: 12.5000 mg | ORAL_TABLET | Freq: Three times a day (TID) | ORAL | 0 refills | Status: DC | PRN

## 2018-12-12 NOTE — Progress Notes (Signed)
[name] Holder is a 63 y.o. male with the following history as recorded in EpicCare:  Patient Active Problem List   Diagnosis Date Noted  . Chronic hyponatremia 11/28/2018  . Orthostatic hypotension 11/28/2018  . Hyperglycemia 11/24/2018  . Deficiency anemia 11/24/2018  . Pathologic compression fracture of spine, initial encounter ([place]) 09/13/2018  . Cancer associated pain 09/13/2018  . Bladder cancer ([place]) 09/13/2018  . Secondary malignant neoplasm of bone and bone marrow ([place]) 08/06/2018  . Spinal stenosis in cervical region 04/12/2018  . Routine general medical examination at a health care facility 08/29/2015  . Hyperlipidemia with target LDL less than 100 12/21/2013  . Other abnormal glucose 12/21/2013  . Upper airway cough syndrome 07/25/2013  . History of prostate cancer   . Essential hypertension 05/20/2010  . Allergic rhinitis 05/20/2010  . Asthma, mild intermittent 05/20/2010    Current Outpatient Medications  Medication Sig Dispense Refill  . abiraterone acetate (ZYTIGA) 250 MG tablet Take 1,000 mg by mouth daily.   10  . albuterol (PROAIR HFA) 108 (90 Base) MCG/ACT inhaler 2 PUFFS EVERY 6 HOURS AS NEEDED FOR WHEEZING 8.5 g 5  . albuterol (PROVENTIL) (2.5 MG/3ML) 0.083% nebulizer solution Take 3 mLs (2.5 mg total) by nebulization every 6 (six) hours as needed for wheezing or shortness of breath. 150 mL 1  . azelastine (ASTELIN) 0.1 % nasal spray Place 2 sprays into both nostrils every morning. (Patient taking differently: Place 2 sprays into both nostrils daily as needed for allergies. )    . budesonide-formoterol (SYMBICORT) 160-4.5 MCG/ACT inhaler Inhale 2 puffs into the lungs 2 (two) times daily. 30.6 g 1  . CALCIUM-VITAMIN D PO Take 1 tablet by mouth 2 (two) times daily.     . carvedilol (COREG) 3.125 MG tablet Take 1 tablet (3.125 mg total) by mouth 2 (two) times daily with a meal. 180 tablet 1  . cyanocobalamin (,VITAMIN B-12,) 1000 MCG/ML injection Inject 1,000 mcg  into the muscle every 30 (thirty) days.   4  . diclofenac sodium (VOLTAREN) 1 % GEL Apply 4 g topically 4 (four) times daily.    [name] ipratropium-albuterol (DUONEB) 0.5-2.5 (3) MG/3ML SOLN Take 3 mLs by nebulization every 4 (four) hours as needed. 360 mL 1  . Leuprolide Acetate (LUPRON IJ) Inject 1 Dose as directed every 4 (four) months. This is administered by [facility]    . meclizine (ANTIVERT) 12.5 MG tablet Take 1 tablet (12.5 mg total) by mouth 3 (three) times daily as needed for dizziness. 30 tablet 0  . mometasone (NASONEX) 50 MCG/ACT nasal spray Place 2 sprays into the nose daily. 51 g 1  . NARCAN 4 MG/0.1ML LIQD nasal spray kit Place 0.4 mg into the nose once.     [name] oxyCODONE (OXY IR/ROXICODONE) 5 MG immediate release tablet Take 1 tablet (5 mg total) by mouth every 4 (four) hours as needed for severe pain. 180 tablet 0  . predniSONE (DELTASONE) 5 MG tablet Take 1 tablet (5 mg total) by mouth daily with breakfast.    . SYRINGE-NEEDLE, DISP, 3 ML 3 ML MISC Use to inject b12 monthly. 12 each 0  . tamsulosin (FLOMAX) 0.4 MG CAPS capsule Take 1 capsule (0.4 mg total) by mouth daily. 30 capsule    No current facility-administered medications for this visit.     Allergies: Amlodipine and Tetracycline  Past Medical History:  Diagnosis Date  . Allergic rhinitis   . At risk for sleep apnea    STOP-BANG SCORE =  5    (routed to pt's pcp 09-08-2018)  . Chronic constipation   . Chronic low back pain   . Chronic pain   . DOE (dyspnea on exertion)   . History of kidney stones   . History of recent pneumonia 08/10/2018   acute bronchopneumonia;  follow-up cxr 08-28-2018 in epic  . History of vertebral compression fracture    T10 and T12 s/p kyphoplasty  . Hx of colonic polyps   . Hyperplasia of prostate with lower urinary tract symptoms (LUTS)   . Hypertension   . Mild persistent asthma    followed by pcp  . Numbness in both legs    secondary to a fall, per pt has had work-up and  couldn't find anything  . Osteopenia   . Prostate cancer metastatic to pelvis [facility]) urologist-- dr borden/  oncologist -- dr Alen Blew   dx 2014 advanced prostate cancer with pelvid adenopathy,  Stage T2c, Gleason 7;  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  . Wears glasses     Past Surgical History:  Procedure Laterality Date  . COLONOSCOPY    . IR KYPHO EA ADDL LEVEL THORACIC OR LUMBAR  08/17/2018  . IR KYPHO EA ADDL LEVEL THORACIC OR LUMBAR  08/26/2018  . IR KYPHO LUMBAR INC FX REDUCE BONE BX UNI/BIL CANNULATION INC/IMAGING  08/26/2018  . IR KYPHO THORACIC WITH BONE BIOPSY  08/17/2018  . IR RADIOLOGIST EVAL & MGMT  08/11/2018  . IR RADIOLOGIST EVAL & MGMT  08/24/2018  . LYMPHADENECTOMY Left 01/05/2013   Procedure: ROBOTIC LYMPHADENECTOMY;  Surgeon: Dutch Gray, MD;  Location: [facility];  Service: Urology;  Laterality: Left;  . PROSTATE BIOPSY     11/12 positive biopsies  . ROTATOR CUFF REPAIR Left 2012  . TRANSURETHRAL RESECTION OF BLADDER TUMOR N/A 09/15/2018   Procedure: TRANSURETHRAL RESECTION OF BLADDER TUMOR (TURBT)WITH CYSTOSCOPY/ POST OPERATIVE INSTILLATION OF GEMCITABINE;  Surgeon: Raynelle Bring, MD;  Location: [facility];  Service: Urology;  Laterality: N/A;  GENERAL ANESTHESIA WITH PARALYSIS  . UMBILICAL HERNIA REPAIR  09-02-2001   dr t. price '@[facility]'$     Family History  Problem Relation Age of Onset  . Arthritis Mother   . Diabetes Mother   . Hypertension Mother   . Hyperlipidemia Mother   . Hypertension Father   . Prostate cancer Father        radiation in mid 33s  . Heart attack Father   . Cancer Sister        precancerous colon polyps, 6 in colon removed  . Stroke Maternal Grandfather     Social History   Tobacco Use  . Smoking status: Former Smoker    Packs/day: 0.50    Years: 25.00    Pack years: 12.50    Types: Cigarettes    Last attempt to quit: 09/08/2012    Years since quitting: 6.2  . Smokeless tobacco: Never Used  Substance Use Topics  . Alcohol use: Not Currently     Alcohol/week: 2.0 standard drinks    Types: 2 Glasses of wine per week    Comment: wine  1  glasses daily    Subjective:    I connected with Glendon Axe on 12/12/18 at  1:00 PM EDT by a video enabled telemedicine application and verified that I am speaking with the correct person using two identifiers. Patient, patient's wife and I are the people present on the video call.    I discussed the limitations of evaluation and management by telemedicine and the  availability of in person appointments. The patient expressed understanding and agreed to proceed.  Started suddenly last night with sudden onset of dizziness; "feels like room is spinning around him" when lying down; has noticed that blood pressure has been elevated in the past 24 hours as well; blood pressure last night was in the 170s/75; recently had Olmesartan discontinued due to hypotension; currently only taking Coreg 3.125 mg bid; had been doing very well with his blood pressure- all normal readings- until last night; Denies any chest pain, shortness of breath, blurred vision or headache.   Had sodium re-checked with his urologist in the past 2 weeks- all labs were normal; opted not to start the [place] as a result;   Patient's wife is an Therapist, sports- asked her to do orthostatic blood pressure check during visit; Lying down 162/98; Sitting 140/82 Standing 128/68 Wife is also able to check patient's pulse- she notes that she has felt some PVCs but no concern for A. Fib; patient has had PVCs in the past but no history of A. Fib    Objective:  There were no vitals filed for this visit.  General: Well developed, well nourished, in no acute distress  Head: Normocephalic and atraumatic  Lungs: Respirations unlabored; Neurologic: Alert and oriented; speech intact; face symmetrical; moves all extremities well; CNII-XII intact without focal deficit   Assessment:  1. Dizziness     Plan:  Orthostatic blood pressure readings are  reassuring: 1) 162/98 ( lying); 140/82 ( sitting); 128/68 (standing); do not feel that patient needs to re-start Olmesartan; Will try treating with Antivert 12.5 mg tid prn for dizziness;  Will check with patient and his wife tomorrow to evaluate response- if still symptomatic, will need to bring into the office for labs/ re-check;  Strict ER precautions discussed and both patient and wife agree.   No follow-ups on file.  No orders of the defined types were placed in this encounter.   Requested Prescriptions   Signed Prescriptions Disp Refills  . meclizine (ANTIVERT) 12.5 MG tablet 30 tablet 0    Sig: Take 1 tablet (12.5 mg total) by mouth 3 (three) times daily as needed for dizziness.

## 2018-12-12 NOTE — Telephone Encounter (Signed)
Pt. Reports his BP medicines where "changed 2-3 weeks ago" and last night started having elevated readings. This morning it is 180/80. Has dizziness when laying down. Warm transfer to Tammy in the practice for a virtual visit.   Answer Assessment - Initial Assessment Questions 1. BLOOD PRESSURE: "What is the blood pressure?" "Did you take at least two measurements 5 minutes apart?"     180/80 2. ONSET: "When did you take your blood pressure?"     This morning 3. HOW: "How did you obtain the blood pressure?" (e.g., visiting nurse, automatic home BP monitor)     Audelia Hives - wife is a Marine scientist 4. HISTORY: "Do you have a history of high blood pressure?"     Yes 5. MEDICATIONS: "Are you taking any medications for blood pressure?" "Have you missed any doses recently?"     No 6. OTHER SYMPTOMS: "Do you have any symptoms?" (e.g., headache, chest pain, blurred vision, difficulty breathing, weakness)     Dizziness when laying down 7. PREGNANCY: "Is there any chance you are pregnant?" "When was your last menstrual period?"     N/A  Protocols used: HIGH BLOOD PRESSURE-A-AH

## 2018-12-13 ENCOUNTER — Telehealth: Payer: Self-pay

## 2018-12-13 NOTE — Telephone Encounter (Signed)
Called and spoke with patient today.  He is feeling much better today. Slight dizziness still with positional changes. Blood pressure this morning was 150/28. He will continue to self monitor and call us back if he doesn't continue to feel better or he gets worse.

## 2018-12-16 NOTE — Telephone Encounter (Signed)
Patient has called back stating that his BP has not continued to go down.  States last BP reading was 180/?.  States he is no longer dizzy but has a slight headache.  Please follow up in regard at [phone].

## 2018-12-16 NOTE — Telephone Encounter (Signed)
Spoke with patient and info given. He will schedule appointment to see Dr. [name] next week.

## 2018-12-16 NOTE — Telephone Encounter (Addendum)
Does he still have the Olmesartan at home? I would tell him he could use the Olmesartan prn when his blood pressure is above 150/90. I would like him to schedule a follow-up with Dr. [name] as well.

## 2018-12-20 ENCOUNTER — Other Ambulatory Visit: Payer: Self-pay

## 2018-12-20 ENCOUNTER — Ambulatory Visit (INDEPENDENT_AMBULATORY_CARE_PROVIDER_SITE_OTHER): Payer: BLUE CROSS/BLUE SHIELD | Admitting: Internal Medicine

## 2018-12-20 ENCOUNTER — Encounter: Payer: Self-pay | Admitting: Internal Medicine

## 2018-12-20 VITALS — BP 124/80 | HR 88 | Temp 99.3°F | Resp 16 | Ht 67.0 in | Wt 172.0 lb

## 2018-12-20 DIAGNOSIS — I951 Orthostatic hypotension: Secondary | ICD-10-CM | POA: Diagnosis not present

## 2018-12-20 DIAGNOSIS — I10 Essential (primary) hypertension: Secondary | ICD-10-CM | POA: Diagnosis not present

## 2018-12-20 DIAGNOSIS — E871 Hypo-osmolality and hyponatremia: Secondary | ICD-10-CM

## 2018-12-20 DIAGNOSIS — H00015 Hordeolum externum left lower eyelid: Secondary | ICD-10-CM

## 2018-12-20 MED ORDER — SULFAMETHOXAZOLE-TRIMETHOPRIM 800-160 MG PO TABS: 2.0000 | ORAL_TABLET | Freq: Two times a day (BID) | ORAL | 0 refills | Status: DC

## 2018-12-20 NOTE — Patient Instructions (Signed)

## 2018-12-20 NOTE — Progress Notes (Signed)
Subjective:  Patient ID: [name] Holder, male    DOB: 04-17-1956  Age: 63 y.o. MRN: [id number]  CC: Hypertension   HPI MAHLIK LENN presents for f/up - He complains of a 2-day history of pain, redness, swelling over his left lower medial eyelid.  Outpatient Medications Prior to Visit  Medication Sig Dispense Refill   abiraterone acetate (ZYTIGA) 250 MG tablet Take 1,000 mg by mouth daily.   10   albuterol (PROAIR HFA) 108 (90 Base) MCG/ACT inhaler 2 PUFFS EVERY 6 HOURS AS NEEDED FOR WHEEZING 8.5 g 5   albuterol (PROVENTIL) (2.5 MG/3ML) 0.083% nebulizer solution Take 3 mLs (2.5 mg total) by nebulization every 6 (six) hours as needed for wheezing or shortness of breath. 150 mL 1   azelastine (ASTELIN) 0.1 % nasal spray Place 2 sprays into both nostrils every morning. (Patient taking differently: Place 2 sprays into both nostrils daily as needed for allergies. )     budesonide-formoterol (SYMBICORT) 160-4.5 MCG/ACT inhaler Inhale 2 puffs into the lungs 2 (two) times daily. 30.6 g 1   CALCIUM-VITAMIN D PO Take 1 tablet by mouth 2 (two) times daily.      carvedilol (COREG) 3.125 MG tablet Take 1 tablet (3.125 mg total) by mouth 2 (two) times daily with a meal. 180 tablet 1   cyanocobalamin (,VITAMIN B-12,) 1000 MCG/ML injection Inject 1,000 mcg into the muscle every 30 (thirty) days.   4   diclofenac sodium (VOLTAREN) 1 % GEL Apply 4 g topically 4 (four) times daily.     ipratropium-albuterol (DUONEB) 0.5-2.5 (3) MG/3ML SOLN Take 3 mLs by nebulization every 4 (four) hours as needed. 360 mL 1   Leuprolide Acetate (LUPRON IJ) Inject 1 Dose as directed every 4 (four) months. This is administered by [facility]     meclizine (ANTIVERT) 12.5 MG tablet Take 1 tablet (12.5 mg total) by mouth 3 (three) times daily as needed for dizziness. 30 tablet 0   mometasone (NASONEX) 50 MCG/ACT nasal spray Place 2 sprays into the nose daily. 51 g 1   NARCAN 4 MG/0.1ML LIQD nasal spray kit  Place 0.4 mg into the nose once.      oxyCODONE (OXY IR/ROXICODONE) 5 MG immediate release tablet Take 1 tablet (5 mg total) by mouth every 4 (four) hours as needed for severe pain. 180 tablet 0   predniSONE (DELTASONE) 5 MG tablet Take 1 tablet (5 mg total) by mouth daily with breakfast. (Patient taking differently: Take 5 mg by mouth 2 (two) times daily with a meal. )     SYRINGE-NEEDLE, DISP, 3 ML 3 ML MISC Use to inject b12 monthly. 12 each 0   tamsulosin (FLOMAX) 0.4 MG CAPS capsule Take 1 capsule (0.4 mg total) by mouth daily. 30 capsule    No facility-administered medications prior to visit.     ROS Review of Systems  Constitutional: Negative for chills, fatigue and fever.  HENT: Negative.   Eyes: Positive for pain and redness. Negative for photophobia, discharge and visual disturbance.  Respiratory: Negative.  Negative for cough, chest tightness and shortness of breath.   Cardiovascular: Negative for chest pain, palpitations and leg swelling.  Gastrointestinal: Negative for abdominal pain, constipation, diarrhea, nausea and vomiting.  Endocrine: Negative.   Genitourinary: Negative.   Musculoskeletal: Negative.  Negative for neck pain.  Skin: Negative.  Negative for rash.  Neurological: Negative.  Negative for dizziness and weakness.  Hematological: Negative for adenopathy. Does not bruise/bleed easily.  Psychiatric/Behavioral: Negative.  Objective:  BP 124/80 (BP Location: Left Arm, Patient Position: Sitting, Cuff Size: Normal)    Pulse 88    Temp 99.3 F (37.4 C) (Oral)    Resp 16    Ht _0  (1.702 m)    Wt 172 lb (78 kg)    SpO2 97%    BMI 26.94 kg/m   BP Readings from Last 3 Encounters:  12/20/18 124/80  11/24/18 120/80  10/11/18 (!) 141/85    Wt Readings from Last 3 Encounters:  12/20/18 172 lb (78 kg)  10/11/18 167 lb 9.6 oz (76 kg)  09/13/18 172 lb 14.9 oz (78.4 kg)    Physical Exam Vitals signs reviewed.  Constitutional:      Appearance: He is  ill-appearing. He is not toxic-appearing or diaphoretic.     Comments: Thin male, sitting in a wheelchair  HENT:     Nose: Nose normal.     Mouth/Throat:     Mouth: Mucous membranes are moist.  Eyes:     General: No scleral icterus.       Right eye: No discharge.        Left eye: Hordeolum present.No discharge.     Conjunctiva/sclera: Conjunctivae normal.   Neck:     Musculoskeletal: Normal range of motion. No neck rigidity.  Cardiovascular:     Rate and Rhythm: Normal rate and regular rhythm.     Heart sounds: No murmur.  Pulmonary:     Effort: Pulmonary effort is normal. No respiratory distress.     Breath sounds: No stridor. No wheezing, rhonchi or rales.  Abdominal:     General: Abdomen is flat.     Palpations: There is no hepatomegaly, splenomegaly or mass.     Tenderness: There is no abdominal tenderness. There is no guarding.  Musculoskeletal: Normal range of motion.     Right lower leg: No edema.     Left lower leg: No edema.  Skin:    General: Skin is warm.     Findings: No rash.  Neurological:     General: No focal deficit present.     Mental Status: He is alert.     Lab Results  Component Value Date   WBC 9.3 11/25/2018   HGB 13.3 11/25/2018   HCT 38.7 (L) 11/25/2018   PLT 313.0 11/25/2018   GLUCOSE 87 11/25/2018   CHOL 162 09/02/2017   TRIG 42.0 09/02/2017   HDL 93.90 09/02/2017   LDLCALC 59 09/02/2017   ALT 20 10/11/2018   AST 18 10/11/2018   NA 131 (L) 11/25/2018   K 4.1 11/25/2018   CL 98 11/25/2018   CREATININE 0.64 11/25/2018   BUN 18 11/25/2018   CO2 26 11/25/2018   TSH 1.666 09/12/2018   PSA 0.52 04/27/2018   INR 0.94 08/26/2018   HGBA1C 5.1 11/25/2018    Ct Head W Or Wo Contrast  Result Date: 09/13/2018 CLINICAL DATA:  Initial evaluation for hallucinations. History of metastatic prostate cancer. EXAM: CT HEAD WITHOUT AND WITH CONTRAST TECHNIQUE: Contiguous axial images were obtained from the base of the skull through the vertex  without and with intravenous contrast CONTRAST:  157m ISOVUE-370 IOPAMIDOL (ISOVUE-370) INJECTION 76% COMPARISON:  Prior MRI from 04/11/2018 FINDINGS: Brain: Examination limited by motion artifact. Generalized age-related cerebral atrophy. No acute intracranial hemorrhage. No acute large vessel territory infarct. No mass lesion, midline shift or mass effect. No hydrocephalus. No extra-axial fluid collection. No appreciable abnormal enhancement. Vascular: No hyperdense vessel. Scattered vascular calcifications noted within  the carotid siphons. Normal intravascular enhancement seen throughout the intracranial vasculature following contrast administration. Skull: Scalp soft tissues demonstrate no acute finding. Calvarium intact. No definite focal osseous lesions on this limited motion degraded exam. Corticated lucency extending through the anterior arch of C1, chronic in appearance, likely congenital. Sinuses/Orbits: Globes and orbital soft tissues within normal limits. Paranasal sinuses and mastoid air cells are grossly clear. Other: None. IMPRESSION: 1. Technically limited exam due to extensive motion artifact. 2. Grossly negative head CT with and without contrast. No acute intracranial abnormality. No mass lesion or abnormal enhancement. Electronically Signed   By: Jeannine Boga M.D.   On: 09/13/2018 02:17   Ct Angio Chest Pe W And/or Wo Contrast  Result Date: 09/13/2018 CLINICAL DATA:  63 year old male with shortness of breath, possible small bowel obstruction. Metastatic prostate cancer. EXAM: CT ANGIOGRAPHY CHEST CT ABDOMEN AND PELVIS WITH CONTRAST TECHNIQUE: Multidetector CT imaging of the chest was performed using the standard protocol during bolus administration of intravenous contrast. Multiplanar CT image reconstructions and MIPs were obtained to evaluate the vascular anatomy. Multidetector CT imaging of the abdomen and pelvis was performed using the standard protocol during bolus administration  of intravenous contrast. CONTRAST:  12m ISOVUE-370 IOPAMIDOL (ISOVUE-370) INJECTION 76% COMPARISON:  Head CT today reported separately. Chest CTA 08/10/2018. CT Abdomen and Pelvis 07/28/2018. FINDINGS: CTA CHEST FINDINGS Cardiovascular: Good contrast bolus timing in the pulmonary arterial tree. Respiratory motion. No central pulmonary embolus. The upper lobe segmental branches also appear patent. The lower lobe branches are poorly evaluated due to motion. No cardiomegaly or pericardial effusion. Stable mild tortuosity of the thoracic aorta. Mediastinum/Nodes: No lymphadenopathy. Lungs/Pleura: Major airways are patent. Chronic atelectasis or scarring in the left lower lung. No pleural effusion or acute pulmonary opacity. Musculoskeletal: Bone detail substantially degraded by motion at some levels. The T10 compression fracture has been augmented. There are new T8 and T9 moderate compression fractures (series 27, image 77). There is a new nonacute appearing anterior left lower rib fracture with callus on series 25, image 114. Review of the MIP images confirms the above findings. CT ABDOMEN and PELVIS FINDINGS Intermittent motion artifact. Hepatobiliary: Stable and negative. Pancreas: Negative. Spleen: Negative. Adrenals/Urinary Tract: Adrenal glands and kidneys appear stable. Renal enhancement and contrast excretion appears symmetric and normal. There are small bilateral benign appearing renal cysts. Urinary bladder detail today is degraded by motion. Stomach/Bowel: Increased retained stool in the large bowel. Redundant sigmoid colon. No large bowel inflammation. Gas and fluid-filled small bowel loops measure at the upper limits of normal, 28 millimeters. Some detail is degraded by motion but there is no small bowel transition identified. The stomach is decompressed.  No free air, free fluid. Vascular/Lymphatic: Aortoiliac calcified atherosclerosis. Major arterial structures appear to remain patent. Grossly patent  portal venous system. No lymphadenopathy. Reproductive: Negative. Other: No pelvic free fluid. Musculoskeletal: Bone detail intermittently degraded by motion. Augmented T12, L1, and L2 compression fractures. Mild L3 superior endplate compression since 07/28/2018. Review of the MIP images confirms the above findings. IMPRESSION: 1. Motion degraded CTA of the chest and CT Abdomen and Pelvis. 2. No central pulmonary embolus, but pulmonary artery branch detail is degraded by motion. 3. New compression fractures of T8, T9, and L3 since January which may be pathologic fractures in the setting of prostate cancer. Interval augmentation of the T10, T12, L1 and L2 fractures. 4. No other new abnormality identified in the chest, abdomen, or pelvis. Electronically Signed   By: HHerminio HeadsD.  On: 09/13/2018 02:36   Ct Abdomen Pelvis W Contrast  Result Date: 09/13/2018 CLINICAL DATA:  63 year old male with shortness of breath, possible small bowel obstruction. Metastatic prostate cancer. EXAM: CT ANGIOGRAPHY CHEST CT ABDOMEN AND PELVIS WITH CONTRAST TECHNIQUE: Multidetector CT imaging of the chest was performed using the standard protocol during bolus administration of intravenous contrast. Multiplanar CT image reconstructions and MIPs were obtained to evaluate the vascular anatomy. Multidetector CT imaging of the abdomen and pelvis was performed using the standard protocol during bolus administration of intravenous contrast. CONTRAST:  196m ISOVUE-370 IOPAMIDOL (ISOVUE-370) INJECTION 76% COMPARISON:  Head CT today reported separately. Chest CTA 08/10/2018. CT Abdomen and Pelvis 07/28/2018. FINDINGS: CTA CHEST FINDINGS Cardiovascular: Good contrast bolus timing in the pulmonary arterial tree. Respiratory motion. No central pulmonary embolus. The upper lobe segmental branches also appear patent. The lower lobe branches are poorly evaluated due to motion. No cardiomegaly or pericardial effusion. Stable mild tortuosity of the  thoracic aorta. Mediastinum/Nodes: No lymphadenopathy. Lungs/Pleura: Major airways are patent. Chronic atelectasis or scarring in the left lower lung. No pleural effusion or acute pulmonary opacity. Musculoskeletal: Bone detail substantially degraded by motion at some levels. The T10 compression fracture has been augmented. There are new T8 and T9 moderate compression fractures (series 27, image 77). There is a new nonacute appearing anterior left lower rib fracture with callus on series 25, image 114. Review of the MIP images confirms the above findings. CT ABDOMEN and PELVIS FINDINGS Intermittent motion artifact. Hepatobiliary: Stable and negative. Pancreas: Negative. Spleen: Negative. Adrenals/Urinary Tract: Adrenal glands and kidneys appear stable. Renal enhancement and contrast excretion appears symmetric and normal. There are small bilateral benign appearing renal cysts. Urinary bladder detail today is degraded by motion. Stomach/Bowel: Increased retained stool in the large bowel. Redundant sigmoid colon. No large bowel inflammation. Gas and fluid-filled small bowel loops measure at the upper limits of normal, 28 millimeters. Some detail is degraded by motion but there is no small bowel transition identified. The stomach is decompressed.  No free air, free fluid. Vascular/Lymphatic: Aortoiliac calcified atherosclerosis. Major arterial structures appear to remain patent. Grossly patent portal venous system. No lymphadenopathy. Reproductive: Negative. Other: No pelvic free fluid. Musculoskeletal: Bone detail intermittently degraded by motion. Augmented T12, L1, and L2 compression fractures. Mild L3 superior endplate compression since 07/28/2018. Review of the MIP images confirms the above findings. IMPRESSION: 1. Motion degraded CTA of the chest and CT Abdomen and Pelvis. 2. No central pulmonary embolus, but pulmonary artery branch detail is degraded by motion. 3. New compression fractures of T8, T9, and L3  since January which may be pathologic fractures in the setting of prostate cancer. Interval augmentation of the T10, T12, L1 and L2 fractures. 4. No other new abnormality identified in the chest, abdomen, or pelvis. Electronically Signed   By: HGenevie AnnM.D.   On: 09/13/2018 02:36   Dg Abdomen Acute W/chest  Result Date: 09/12/2018 CLINICAL DATA:  Right-sided rib pain EXAM: DG ABDOMEN ACUTE W/ 1V CHEST COMPARISON:  08/28/2018 FINDINGS: Single-view chest demonstrates platelike atelectasis in the left mid lung with streaky atelectasis at the left base. Stable cardiomediastinal silhouette. Supine and decubitus views of the abdomen demonstrate no definite free air. Mildly dilated loops of central small bowel measuring up to 3 cm with scattered distal bowel gas noted. Scoliosis of the spine with multiple treated compression fractures. IMPRESSION: 1. Low lung volumes with streaky atelectasis at the left mid to lower lung. 2. Mild air distention of central small bowel  with distal gas present, findings could be secondary to ileus or partial bowel obstruction. Electronically Signed   By: Donavan Foil M.D.   On: 09/12/2018 23:44    Assessment & Plan:   Zyair was seen today for hypertension.  Diagnoses and all orders for this visit:  Orthostatic hypotension- This has resolved.  Chronic hyponatremia- This has been managed by his urologist.  He was told not to take the Florinef.  He tells me his urologist recently rechecked his sodium level and it was normal.  Hordeolum externum of left lower eyelid- This is most likely a staph infection.  Will treat with a 7-day course of Bactrim DS. -     sulfamethoxazole-trimethoprim (BACTRIM DS) 800-160 MG tablet; Take 2 tablets by mouth 2 (two) times daily for 7 days.  Essential hypertension- His blood pressure is adequately well controlled.   I am having Crawford Tamura. Gubbels start on sulfamethoxazole-trimethoprim. I am also having him maintain his CALCIUM-VITAMIN D  PO, abiraterone acetate, Leuprolide Acetate (LUPRON IJ), azelastine, SYRINGE-NEEDLE (DISP) 3 ML, cyanocobalamin, Narcan, tamsulosin, diclofenac sodium, predniSONE, oxyCODONE, budesonide-formoterol, mometasone, albuterol, albuterol, ipratropium-albuterol, carvedilol, and meclizine.  Meds ordered this encounter  Medications   sulfamethoxazole-trimethoprim (BACTRIM DS) 800-160 MG tablet    Sig: Take 2 tablets by mouth 2 (two) times daily for 7 days.    Dispense:  28 tablet    Refill:  0     Follow-up: Return if symptoms worsen or fail to improve.  Scarlette Calico, MD

## 2018-12-21 ENCOUNTER — Telehealth: Payer: Self-pay | Admitting: Family

## 2018-12-22 ENCOUNTER — Other Ambulatory Visit: Payer: Self-pay | Admitting: Internal Medicine

## 2018-12-22 DIAGNOSIS — R42 Dizziness and giddiness: Secondary | ICD-10-CM

## 2018-12-22 MED ORDER — MECLIZINE HCL 12.5 MG PO TABS: 12.5000 mg | ORAL_TABLET | Freq: Three times a day (TID) | ORAL | 1 refills | Status: DC | PRN

## 2018-12-22 NOTE — Telephone Encounter (Signed)
Pt is requesting a 90 supply of antivert. Please advise.   [facility]

## 2018-12-22 NOTE — Telephone Encounter (Signed)
Caller name: Relation to pt: self  Call back [phone] Pharmacy: [place], [place] [phone] (Phone) [phone] (Fax)     Reason for call:  Patient states meclizine (ANTIVERT) 12.5 MG tablet was prescribed 12/12/2018 3x daily 30 tablets, patient will run out and requesting a 90 day supply if appropriate, please advise

## 2018-12-26 ENCOUNTER — Telehealth: Payer: Self-pay | Admitting: Internal Medicine

## 2018-12-26 DIAGNOSIS — E860 Dehydration: Secondary | ICD-10-CM | POA: Diagnosis not present

## 2018-12-26 DIAGNOSIS — I9589 Other hypotension: Secondary | ICD-10-CM | POA: Diagnosis not present

## 2018-12-26 DIAGNOSIS — R350 Frequency of micturition: Secondary | ICD-10-CM | POA: Diagnosis not present

## 2018-12-26 DIAGNOSIS — N390 Urinary tract infection, site not specified: Secondary | ICD-10-CM | POA: Diagnosis not present

## 2018-12-26 NOTE — Telephone Encounter (Signed)
Copied from [place] [id number]. Topic: Quick Communication - See Telephone Encounter >> Dec 26, 2018  8:47 AM Robina Ade, Helene Kelp D wrote: CRM for notification. See Telephone encounter for: 12/26/18. Patient called and said that he believe that he has a possible UTI or kidney infection due to the only symptoms he has is a fever. He would like a call back from his PCP or his CMA to talk about this.

## 2018-12-26 NOTE — Telephone Encounter (Signed)
Called and spoke to pt and pt spouse. Pt has been having UTI sx and also a fever for the last few days. Due to fever I instructed pt and spouse to go to the [facility] per protocol.

## 2018-12-27 ENCOUNTER — Other Ambulatory Visit: Payer: Self-pay

## 2018-12-27 ENCOUNTER — Inpatient Hospital Stay (HOSPITAL_COMMUNITY)
Admission: EM | Admit: 2018-12-27 | Discharge: 2018-12-30 | DRG: 871 | Disposition: A | Discharge status: Home Health | Payer: Medicare Other | Attending: Family Medicine | Admitting: Family Medicine

## 2018-12-27 ENCOUNTER — Emergency Department (HOSPITAL_COMMUNITY): Payer: Medicare Other

## 2018-12-27 ENCOUNTER — Encounter (HOSPITAL_COMMUNITY): Payer: Self-pay | Admitting: Family Medicine

## 2018-12-27 DIAGNOSIS — N400 Enlarged prostate without lower urinary tract symptoms: Secondary | ICD-10-CM | POA: Diagnosis not present

## 2018-12-27 DIAGNOSIS — J4530 Mild persistent asthma, uncomplicated: Secondary | ICD-10-CM | POA: Diagnosis not present

## 2018-12-27 DIAGNOSIS — Z20828 Contact with and (suspected) exposure to other viral communicable diseases: Secondary | ICD-10-CM | POA: Diagnosis not present

## 2018-12-27 DIAGNOSIS — C689 Malignant neoplasm of urinary organ, unspecified: Secondary | ICD-10-CM | POA: Diagnosis present

## 2018-12-27 DIAGNOSIS — M8580 Other specified disorders of bone density and structure, unspecified site: Secondary | ICD-10-CM | POA: Diagnosis not present

## 2018-12-27 DIAGNOSIS — Z79899 Other long term (current) drug therapy: Secondary | ICD-10-CM

## 2018-12-27 DIAGNOSIS — G9340 Encephalopathy, unspecified: Secondary | ICD-10-CM | POA: Diagnosis not present

## 2018-12-27 DIAGNOSIS — R410 Disorientation, unspecified: Secondary | ICD-10-CM | POA: Diagnosis not present

## 2018-12-27 DIAGNOSIS — Z888 Allergy status to other drugs, medicaments and biological substances status: Secondary | ICD-10-CM

## 2018-12-27 DIAGNOSIS — N179 Acute kidney failure, unspecified: Secondary | ICD-10-CM | POA: Diagnosis not present

## 2018-12-27 DIAGNOSIS — I10 Essential (primary) hypertension: Secondary | ICD-10-CM | POA: Diagnosis not present

## 2018-12-27 DIAGNOSIS — G92 Toxic encephalopathy: Secondary | ICD-10-CM | POA: Diagnosis present

## 2018-12-27 DIAGNOSIS — Z9079 Acquired absence of other genital organ(s): Secondary | ICD-10-CM | POA: Diagnosis not present

## 2018-12-27 DIAGNOSIS — E871 Hypo-osmolality and hyponatremia: Secondary | ICD-10-CM | POA: Diagnosis not present

## 2018-12-27 DIAGNOSIS — E872 Acidosis: Secondary | ICD-10-CM | POA: Diagnosis present

## 2018-12-27 DIAGNOSIS — G8928 Other chronic postprocedural pain: Secondary | ICD-10-CM | POA: Diagnosis present

## 2018-12-27 DIAGNOSIS — D6959 Other secondary thrombocytopenia: Secondary | ICD-10-CM | POA: Diagnosis present

## 2018-12-27 DIAGNOSIS — E222 Syndrome of inappropriate secretion of antidiuretic hormone: Secondary | ICD-10-CM | POA: Diagnosis not present

## 2018-12-27 DIAGNOSIS — N390 Urinary tract infection, site not specified: Secondary | ICD-10-CM | POA: Diagnosis not present

## 2018-12-27 DIAGNOSIS — Z8701 Personal history of pneumonia (recurrent): Secondary | ICD-10-CM | POA: Diagnosis not present

## 2018-12-27 DIAGNOSIS — Z87442 Personal history of urinary calculi: Secondary | ICD-10-CM

## 2018-12-27 DIAGNOSIS — G253 Myoclonus: Secondary | ICD-10-CM | POA: Diagnosis not present

## 2018-12-27 DIAGNOSIS — Z7951 Long term (current) use of inhaled steroids: Secondary | ICD-10-CM

## 2018-12-27 DIAGNOSIS — C7951 Secondary malignant neoplasm of bone: Secondary | ICD-10-CM | POA: Diagnosis not present

## 2018-12-27 DIAGNOSIS — Z791 Long term (current) use of non-steroidal anti-inflammatories (NSAID): Secondary | ICD-10-CM

## 2018-12-27 DIAGNOSIS — D649 Anemia, unspecified: Secondary | ICD-10-CM

## 2018-12-27 DIAGNOSIS — E860 Dehydration: Secondary | ICD-10-CM | POA: Diagnosis not present

## 2018-12-27 DIAGNOSIS — A419 Sepsis, unspecified organism: Secondary | ICD-10-CM | POA: Diagnosis not present

## 2018-12-27 DIAGNOSIS — M4854XD Collapsed vertebra, not elsewhere classified, thoracic region, subsequent encounter for fracture with routine healing: Secondary | ICD-10-CM | POA: Diagnosis present

## 2018-12-27 DIAGNOSIS — R531 Weakness: Secondary | ICD-10-CM | POA: Diagnosis not present

## 2018-12-27 DIAGNOSIS — M4856XD Collapsed vertebra, not elsewhere classified, lumbar region, subsequent encounter for fracture with routine healing: Secondary | ICD-10-CM | POA: Diagnosis present

## 2018-12-27 DIAGNOSIS — C61 Malignant neoplasm of prostate: Secondary | ICD-10-CM | POA: Diagnosis present

## 2018-12-27 DIAGNOSIS — Z8546 Personal history of malignant neoplasm of prostate: Secondary | ICD-10-CM | POA: Diagnosis not present

## 2018-12-27 DIAGNOSIS — M545 Low back pain: Secondary | ICD-10-CM | POA: Diagnosis not present

## 2018-12-27 DIAGNOSIS — Z87891 Personal history of nicotine dependence: Secondary | ICD-10-CM

## 2018-12-27 DIAGNOSIS — Z881 Allergy status to other antibiotic agents status: Secondary | ICD-10-CM

## 2018-12-27 DIAGNOSIS — Z7952 Long term (current) use of systemic steroids: Secondary | ICD-10-CM

## 2018-12-27 DIAGNOSIS — R197 Diarrhea, unspecified: Secondary | ICD-10-CM

## 2018-12-27 LAB — BASIC METABOLIC PANEL
Anion gap: 10 (ref 5–15)
Anion gap: 10 (ref 5–15)
BUN: 27 mg/dL — ABNORMAL HIGH (ref 8–23)
BUN: 21 mg/dL (ref 8–23)
CO2: 21 mmol/L — ABNORMAL LOW (ref 22–32)
CO2: 17 mmol/L — ABNORMAL LOW (ref 22–32)
Calcium: 9 mg/dL (ref 8.9–10.3)
Calcium: 8.7 mg/dL — ABNORMAL LOW (ref 8.9–10.3)
Chloride: 96 mmol/L — ABNORMAL LOW (ref 98–111)
Chloride: 98 mmol/L (ref 98–111)
Creatinine, Ser: 1.24 mg/dL (ref 0.61–1.24)
Creatinine, Ser: 0.9 mg/dL (ref 0.61–1.24)
GFR calc Af Amer: >60 mL/min (ref >60)
GFR calc Af Amer: >60 mL/min (ref >60)
GFR calc non Af Amer: >60 mL/min (ref >60)
GFR calc non Af Amer: >60 mL/min (ref >60)
Glucose, Bld: 115 mg/dL — ABNORMAL HIGH (ref 70–99)
Glucose, Bld: 106 mg/dL — ABNORMAL HIGH (ref 70–99)
Potassium: 4.2 mmol/L (ref 3.5–5.1)
Potassium: 3.9 mmol/L (ref 3.5–5.1)
Sodium: 127 mmol/L — ABNORMAL LOW (ref 135–145)
Sodium: 125 mmol/L — ABNORMAL LOW (ref 135–145)

## 2018-12-27 LAB — CBC
HCT: 39.2 % (ref 39.0–52.0)
Hemoglobin: 13.1 g/dL (ref 13.0–17.0)
MCH: 29.7 pg (ref 26.0–34.0)
MCHC: 33.4 g/dL (ref 30.0–36.0)
MCV: 88.9 fL (ref 80.0–100.0)
Platelets: 127 10*3/uL — ABNORMAL LOW (ref 150–400)
RBC: 4.41 MIL/uL (ref 4.22–5.81)
RDW: 13.4 % (ref 11.5–15.5)
WBC: 7.6 10*3/uL (ref 4.0–10.5)
nRBC: 0 % (ref 0.0–0.2)

## 2018-12-27 LAB — VITAMIN B12: Vitamin B-12: 697 pg/mL (ref 180–914)

## 2018-12-27 LAB — SARS CORONAVIRUS 2 BY RT PCR (HOSPITAL ORDER, PERFORMED IN ~~LOC~~ HOSPITAL LAB): SARS Coronavirus 2: NEGATIVE

## 2018-12-27 LAB — TSH: TSH: 1.006 u[IU]/mL (ref 0.350–4.500)

## 2018-12-27 LAB — AMMONIA: Ammonia: <9 umol/L — ABNORMAL LOW (ref 9–35)

## 2018-12-27 LAB — CBG MONITORING, ED: Glucose-Capillary: 107 mg/dL — ABNORMAL HIGH (ref 70–99)

## 2018-12-27 MED ORDER — HYDROMORPHONE HCL 1 MG/ML IJ SOLN
0.5000 mg | Freq: Once | INTRAMUSCULAR | Status: AC
  Administered 2018-12-27: 0.5 mg via INTRAVENOUS
  Filled 2018-12-27: qty 1

## 2018-12-27 MED ORDER — OXYCODONE HCL 5 MG PO TABS
5.0000 mg | ORAL_TABLET | ORAL | Status: DC | PRN
  Administered 2018-12-28: 5 mg via ORAL
  Administered 2018-12-28: 2.5 mg via ORAL
  Filled 2018-12-27 (×2): qty 1

## 2018-12-27 MED ORDER — ACETAMINOPHEN 325 MG PO TABS
650.0000 mg | ORAL_TABLET | Freq: Four times a day (QID) | ORAL | Status: DC | PRN
  Administered 2018-12-28 (×2): 650 mg via ORAL
  Filled 2018-12-27 (×2): qty 2

## 2018-12-27 MED ORDER — ENOXAPARIN SODIUM 40 MG/0.4ML ~~LOC~~ SOLN
40.0000 mg | SUBCUTANEOUS | Status: DC
  Administered 2018-12-28: 40 mg via SUBCUTANEOUS
  Filled 2018-12-27: qty 0.4

## 2018-12-27 MED ORDER — ACETAMINOPHEN 650 MG RE SUPP: 650.0000 mg | Freq: Four times a day (QID) | RECTAL | Status: DC | PRN

## 2018-12-27 MED ORDER — SODIUM CHLORIDE 0.9 % IV BOLUS
1000.0000 mL | Freq: Once | INTRAVENOUS | Status: AC
  Administered 2018-12-27: 1000 mL via INTRAVENOUS

## 2018-12-27 MED ORDER — SODIUM CHLORIDE 0.9 % IV SOLN
INTRAVENOUS | Status: DC
  Administered 2018-12-27: 23:00:00 via INTRAVENOUS

## 2018-12-27 MED ORDER — LORAZEPAM 2 MG/ML IJ SOLN
1.0000 mg | Freq: Once | INTRAMUSCULAR | Status: AC
  Administered 2018-12-27: 1 mg via INTRAVENOUS
  Filled 2018-12-27: qty 1

## 2018-12-27 MED ORDER — SODIUM CHLORIDE 0.9% FLUSH: 3.0000 mL | Freq: Once | INTRAVENOUS | Status: DC

## 2018-12-27 MED ORDER — GADOBUTROL 1 MMOL/ML IV SOLN
8.0000 mL | Freq: Once | INTRAVENOUS | Status: AC | PRN
  Administered 2018-12-27: 8 mL via INTRAVENOUS

## 2018-12-27 NOTE — ED Notes (Signed)
Pt found at the end of the bed attempting to get up tangled in the wires from the monitor and the tubing from the IV. Pt removed their IV and then stated "I don't know why I did that". Pt returned to bed and repositioned. Pt's bed in lowest position with call bell beside them. Pt asked to please not get out of bed without assistance. Fara Chute, RN to inform EDP

## 2018-12-27 NOTE — ED Triage Notes (Signed)
Patient is complaining of hypotension and sodium level was 131 yesterday. He states he was given a liter of normal saline at urgent care yesterday.

## 2018-12-27 NOTE — ED Notes (Addendum)
Pt attempted to stand at bedside to use urinal. Pt unable to void at this time

## 2018-12-27 NOTE — H&P (Signed)
History and Physical    [name] Holder MNO:[id number] DOB: [birth year] DOA: 12/27/2018  PCP: [name], MD Patient coming from: Home  Chief Complaint: Altered mental status  HPI: [name] Holder is a 63 y.o. male with medical history significant of castration resistant prostate cancer, multiple compression fractures causing chronic pain, history of kyphoplasty of thoracic and lumbar spine, chronic pain, nephrolithiasis, BPH, urothelial carcinoma status post transurethral resection of bladder tumor, hypertension, asthma, and conditions listed below presenting to the hospital for evaluation of confusion.  Patient knows his name and that he is at a hospital.  States he lives at home with his wife who was concerned that he has been confused.  He is not able to give me any additional history.  Denies having any complaints.  I spoke to the patient's wife over the phone who informed me that the patient has been confused especially at nights for the past few days.  He started having fevers 2 days ago and she has been giving him Tylenol.  8 days ago he was diagnosed with stye in his eye and now prescribed Bactrim which he has finished.  Patient is also been having urinary frequency and urgency.  Wife states they went to an urgent care center yesterday and were told that his sodium was 127.  States he was given IV fluid at the urgent care center and felt better but then overnight started becoming confused again.  Wife states he has not had any vomiting or diarrhea last few days but yesterday at the urgent care center they thought he may have prostatitis and gave him a 2-week course of Cipro.  Patient took 1 dose of Cipro in the evening and soon after had very foul-smelling diarrhea.  States he takes oxycodone 2.5 mg every 4 hours for chronic back pain from vertebral fractures.  He does not take any other sedating medications.  ED Course: Afebrile.  No leukocytosis.  Hemoglobin 13.1, at baseline.   Platelet count 127,000.  Sodium 127.  Glucose 115.  BUN 27.  Creatinine 1.2, baseline 0.6-0.7.  UA pending.  COVID-19 rapid test pending.  X-ray of lumbar spine showing no acute osseous abnormality.  MRI brain negative for acute intracranial finding.  Per ED provider, prostate nontender on exam.  Review of Systems:  All systems reviewed and apart from history of presenting illness, are negative.  Past Medical History:  Diagnosis Date   Allergic rhinitis    At risk for sleep apnea    STOP-BANG SCORE = 5    (routed to pt's pcp 09-08-2018)   Chronic constipation    Chronic low back pain    Chronic pain    DOE (dyspnea on exertion)    History of kidney stones    History of recent pneumonia 08/10/2018   acute bronchopneumonia;  follow-up cxr 08-28-2018 in epic   History of vertebral compression fracture    T10 and T12 s/p kyphoplasty   Hx of colonic polyps    Hyperplasia of prostate with lower urinary tract symptoms (LUTS)    Hypertension    Mild persistent asthma    followed by pcp   Numbness in both legs    secondary to a fall, per pt has had work-up and couldn't find anything   Osteopenia    Prostate cancer metastatic to pelvis [facility]) urologist-- dr borden/  oncologist -- dr Alen Blew   dx 2014 advanced prostate cancer with pelvid adenopathy,  Stage T2c, Gleason 7;  2018 dx castrate  resistant   Wears glasses     Past Surgical History:  Procedure Laterality Date   COLONOSCOPY     IR KYPHO EA ADDL LEVEL THORACIC OR LUMBAR  08/17/2018   IR KYPHO EA ADDL LEVEL THORACIC OR LUMBAR  08/26/2018   IR KYPHO LUMBAR INC FX REDUCE BONE BX UNI/BIL CANNULATION INC/IMAGING  08/26/2018   IR KYPHO THORACIC WITH BONE BIOPSY  08/17/2018   IR RADIOLOGIST EVAL & MGMT  08/11/2018   IR RADIOLOGIST EVAL & MGMT  08/24/2018   LYMPHADENECTOMY Left 01/05/2013   Procedure: ROBOTIC LYMPHADENECTOMY;  Surgeon: Dutch Gray, MD;  Location: [facility];  Service: Urology;  Laterality: Left;   PROSTATE  BIOPSY     11/12 positive biopsies   ROTATOR CUFF REPAIR Left 2012   TRANSURETHRAL RESECTION OF BLADDER TUMOR N/A 09/15/2018   Procedure: TRANSURETHRAL RESECTION OF BLADDER TUMOR (TURBT)WITH CYSTOSCOPY/ POST OPERATIVE INSTILLATION OF GEMCITABINE;  Surgeon: Raynelle Bring, MD;  Location: [facility];  Service: Urology;  Laterality: N/A;  GENERAL ANESTHESIA WITH PARALYSIS   UMBILICAL HERNIA REPAIR  09-02-2001   dr t. price _0      reports that he quit smoking about 6 years ago. His smoking use included cigarettes. He has a 12.50 pack-year smoking history. He has never used smokeless tobacco. He reports previous alcohol use of about 2.0 standard drinks of alcohol per week. He reports that he does not use drugs.  Allergies  Allergen Reactions   Amlodipine Swelling    edema   Tetracycline Rash    Family History  Problem Relation Age of Onset   Arthritis Mother    Diabetes Mother    Hypertension Mother    Hyperlipidemia Mother    Hypertension Father    Prostate cancer Father        radiation in mid 81s   Heart attack Father    Cancer Sister        precancerous colon polyps, 6 in colon removed   Stroke Maternal Grandfather     Prior to Admission medications   Medication Sig Start Date End Date Taking? Authorizing Provider  abiraterone acetate (ZYTIGA) 250 MG tablet Take 1,000 mg by mouth daily.  08/17/17   [provider]  albuterol (PROAIR HFA) 108 (90 Base) MCG/ACT inhaler 2 PUFFS EVERY 6 HOURS AS NEEDED FOR WHEEZING 11/24/18   [name], MD  albuterol (PROVENTIL) (2.5 MG/3ML) 0.083% nebulizer solution Take 3 mLs (2.5 mg total) by nebulization every 6 (six) hours as needed for wheezing or shortness of breath. 11/24/18   [name], MD  azelastine (ASTELIN) 0.1 % nasal spray Place 2 sprays into both nostrils every morning. Patient taking differently: Place 2 sprays into both nostrils daily as needed for allergies.  04/12/18   Johnson, Clanford L, MD    budesonide-formoterol (SYMBICORT) 160-4.5 MCG/ACT inhaler Inhale 2 puffs into the lungs 2 (two) times daily. 11/24/18   [name], MD  CALCIUM-VITAMIN D PO Take 1 tablet by mouth 2 (two) times daily.     [provider]  carvedilol (COREG) 3.125 MG tablet Take 1 tablet (3.125 mg total) by mouth 2 (two) times daily with a meal. 12/02/18   [name], MD  ciprofloxacin (CIPRO) 500 MG tablet  12/26/18   [provider]  cyanocobalamin (,VITAMIN B-12,) 1000 MCG/ML injection Inject 1,000 mcg into the muscle every 30 (thirty) days.  05/14/18   [provider]  diclofenac sodium (VOLTAREN) 1 % GEL Apply 4 g topically 4 (four) times  daily. 09/21/18   Dahal, Marlowe Aschoff, MD  ipratropium-albuterol (DUONEB) 0.5-2.5 (3) MG/3ML SOLN Take 3 mLs by nebulization every 4 (four) hours as needed. 11/24/18   [name], MD  Leuprolide Acetate (LUPRON IJ) Inject 1 Dose as directed every 4 (four) months. This is administered by [facility]    [provider]  meclizine (ANTIVERT) 12.5 MG tablet Take 1 tablet (12.5 mg total) by mouth 3 (three) times daily as needed for dizziness. 12/22/18   [name], MD  mometasone (NASONEX) 50 MCG/ACT nasal spray Place 2 sprays into the nose daily. 11/24/18   [name], MD  NARCAN 4 MG/0.1ML LIQD nasal spray kit Place 0.4 mg into the nose once.  08/18/18   [provider]  oxyCODONE (OXY IR/ROXICODONE) 5 MG immediate release tablet Take 1 tablet (5 mg total) by mouth every 4 (four) hours as needed for severe pain. 11/18/18   Wyatt Portela, MD  predniSONE (DELTASONE) 5 MG tablet Take 1 tablet (5 mg total) by mouth daily with breakfast. Patient taking differently: Take 5 mg by mouth 2 (two) times daily with a meal.  09/21/18   Dahal, Marlowe Aschoff, MD  sulfamethoxazole-trimethoprim (BACTRIM DS) 800-160 MG tablet Take 2 tablets by mouth 2 (two) times daily for 7 days. 12/20/18 12/27/18  [name], MD  SYRINGE-NEEDLE, DISP, 3 ML 3 ML  MISC Use to inject b12 monthly. 04/19/18   [name], MD  tamsulosin (FLOMAX) 0.4 MG CAPS capsule Take 1 capsule (0.4 mg total) by mouth daily. 09/22/18   Terrilee Croak, MD    Physical Exam: Vitals:   12/27/18 1700 12/27/18 1738 12/27/18 2007 12/27/18 2142  BP: 110/65 95/60 104/65 126/76  Pulse: 97 94 (!) 108 (!) 119  Resp: (!) 29 (!) 35 (!) 33 (!) 24  Temp:    (!) 101.2 F (38.4 C)  TempSrc:    Oral  SpO2: 94% 100% 98% 98%  Weight:      Height:        Physical Exam  Constitutional: He is oriented to person, place, and time. He appears well-developed and well-nourished. No distress.  HENT:  Head: Normocephalic.  Mouth/Throat: Oropharynx is clear and moist.  Eyes: Right eye exhibits no discharge. Left eye exhibits no discharge.  Neck: Neck supple.  Cardiovascular: Normal rate, regular rhythm and intact distal pulses.  Pulmonary/Chest: Effort normal and breath sounds normal. No respiratory distress. He has no wheezes. He has no rales.  Abdominal: Soft. Bowel sounds are normal. He exhibits no distension. There is abdominal tenderness. There is no rebound and no guarding.  Suprapubic region tender to palpation  Musculoskeletal:        General: No edema.  Neurological: He is alert and oriented to person, place, and time. No cranial nerve deficit.  Strength 5 out of 5 in bilateral upper and lower extremities. Sensation to light touch intact throughout. Following commands appropriately.  Skin: Skin is warm and dry. He is not diaphoretic.     Labs on Admission: I have personally reviewed following labs and imaging studies  CBC: Recent Labs  Lab 12/27/18 1435  WBC 7.6  HGB 13.1  HCT 39.2  MCV 88.9  PLT 557*   Basic Metabolic Panel: Recent Labs  Lab 12/27/18 1435 12/27/18 2251  NA 127* 125*  K 4.2 3.9  CL 96* 98  CO2 21* 17*  GLUCOSE 115* 106*  BUN 27* 21  CREATININE 1.24 0.90  CALCIUM 9.0 8.7*   GFR: Estimated  Creatinine Clearance: 77.5 mL/min (by C-G  formula based on SCr of 0.9 mg/dL). Liver Function Tests: No results for input(s): AST, ALT, ALKPHOS, BILITOT, PROT, ALBUMIN in the last 168 hours. No results for input(s): LIPASE, AMYLASE in the last 168 hours. Recent Labs  Lab 12/27/18 2251  AMMONIA <9*   Coagulation Profile: No results for input(s): INR, PROTIME in the last 168 hours. Cardiac Enzymes: No results for input(s): CKTOTAL, CKMB, CKMBINDEX, TROPONINI in the last 168 hours. BNP (last 3 results) No results for input(s): PROBNP in the last 8760 hours. HbA1C: No results for input(s): HGBA1C in the last 72 hours. CBG: Recent Labs  Lab 12/27/18 1553  GLUCAP 107*   Lipid Profile: No results for input(s): CHOL, HDL, LDLCALC, TRIG, CHOLHDL, LDLDIRECT in the last 72 hours. Thyroid Function Tests: Recent Labs    12/27/18 2251  TSH 1.006   Anemia Panel: Recent Labs    12/27/18 2251  VITAMINB12 697   Urine analysis:    Component Value Date/Time   COLORURINE AMBER (A) 12/27/2018 2347   APPEARANCEUR HAZY (A) 12/27/2018 2347   LABSPEC 1.027 12/27/2018 2347   PHURINE 5.0 12/27/2018 2347   GLUCOSEU NEGATIVE 12/27/2018 2347   GLUCOSEU NEGATIVE 08/31/2016 1030   HGBUR NEGATIVE 12/27/2018 2347   BILIRUBINUR NEGATIVE 12/27/2018 2347   KETONESUR 5 (A) 12/27/2018 2347   PROTEINUR 30 (A) 12/27/2018 2347   UROBILINOGEN 0.2 08/31/2016 1030   NITRITE NEGATIVE 12/27/2018 2347   LEUKOCYTESUR NEGATIVE 12/27/2018 2347    Radiological Exams on Admission: Dg Lumbar Spine 2-3 Views  Result Date: 12/27/2018 CLINICAL DATA:  Acute on chronic low back pain. History of prior compression fractures status post kyphoplasty. EXAM: LUMBAR SPINE - 2-3 VIEW COMPARISON:  CT abdomen pelvis dated September 13, 2018. FINDINGS: Five lumbar type vertebral bodies. No acute fracture or subluxation. Unchanged chronic mild L3 central superior endplate compression fracture. Unchanged chronic T10, T12, L1, and L2 compression deformities status post cement  augmentation. Unchanged trace retrolisthesis at L3-L4 and trace anterolisthesis at L5-S1. Unchanged bilateral L5 pars defects. Unchanged moderate disc height loss at L3-L4 and severe disc height loss at L4-L5. The sacroiliac joints are unremarkable. Aortoiliac atherosclerotic vascular disease. IMPRESSION: 1. No acute osseous abnormality. Multiple chronic thoracolumbar compression deformities are unchanged. 2. Unchanged moderate L3-L4 and severe L4-L5 degenerative disc disease. Electronically Signed   By: Titus Dubin M.D.   On: 12/27/2018 17:00   Mr Jeri Cos JK Contrast  Result Date: 12/27/2018 CLINICAL DATA:  History of prostate cancer and new confusion, uncontrollable jerking. EXAM: MRI HEAD WITHOUT AND WITH CONTRAST TECHNIQUE: Multiplanar, multiecho pulse sequences of the brain and surrounding structures were obtained without and with intravenous contrast. CONTRAST:  Gadavist 8 mL COMPARISON:  CT head 09/13/2018. MR head 09/19/2018. MR brain 04/11/2018. CT head 04/11/2018. FINDINGS: There is significant motion degradation. The study is of borderline to marginal diagnostic utility. Brain: No restricted diffusion to suggest acute infarct. No hemorrhage, mass lesion, hydrocephalus, or extra-axial fluid. Advanced for age cerebral and cerebellar atrophy. Mild subcortical and periventricular T2 and FLAIR hyperintensities, likely chronic microvascular ischemic change. There is minimal symmetric pachymeningeal enhancement, doubtful significance. No intra-axial lesions are seen. Vascular: Flow voids are maintained. Skull and upper cervical spine: There is asymmetric thickness of the LEFT parietal calvarium is compared to the RIGHT. This appearance is stable from 2019, and metastatic disease is not established. Sinuses/Orbits: No significant sinus or orbital disease. Other: No visible mastoid fluid. IMPRESSION: Motion degraded exam demonstrating atrophy, and small vessel  disease, but no definite acute intracranial  findings. No visible metastatic disease to the brain or extradural space. Slight asymmetric thickening of the LEFT parietal calvarium appears stable from prior imaging studies, and is not clearly pathologic, despite patient's history of prostate cancer. Electronically Signed   By: Staci Righter M.D.   On: 12/27/2018 18:59    EKG: Independently reviewed.  Sinus rhythm, baseline wander.  Assessment/Plan Principal Problem:   Acute encephalopathy Active Problems:   Hyponatremia   AKI (acute kidney injury) ([place])   Diarrhea   Anemia   Acute encephalopathy Exact etiology unkown.  Possibly related to dehydration.  Infectious etiology less likely as patient is afebrile and does not have leukocytosis.  Lungs clear on exam.  COVID-19 rapid test negative.  Per family, patient was started on Cipro yesterday for presumed prostatitis.  Per ED provider, no tenderness noted on examination of the prostate gland.  UA without evidence of pyuria or bacteriuria. Brain MRI negative for acute intracranial finding.  TSH, B12, and ammonia level normal.  Per wife, no recent change in his pain medication and is currently not taking any sedating medications at home.  Patient is currently AAO x3 but not giving any additional history. -IV fluid hydration -Continue to monitor for mental status changes -Urine culture pending  Hyponatremia Sodium 127 on admission, was 131 a month ago but previously normal.  Patient is not on a thiazide diuretic. -IV fluid hydration -Monitor BMP every 4 hours -Check serum osmolarity  AKI BUN 27.  Creatinine 1.2, baseline 0.6-0.7.  Likely prerenal secondary to dehydration. -IV fluid hydration -Continue to monitor BMP -Avoid nephrotoxic agents -Monitor urine output  Diarrhea Abdominal exam benign.  No episodes of diarrhea in the hospital. -Check C. difficile panel given recent antibiotic use -Imodium as needed  Chronic anemia -Stable.  Hemoglobin 13.1, at baseline.  -Continue to  monitor CBC  Mild thrombocytopenia Platelet count 127,000.  No signs of active bleeding. -Continue to monitor CBC  Physical deconditioning -PT evaluation  Prostate cancer -Continue Zytiga and prednisone  Chronic back pain secondary to multiple compression fractures -Continue home oxycodone IR 5 mg every 4 hours as needed  Hypertension -Continue home Coreg  Asthma -Stable.  No bronchospasm.  Continue home inhalers.  DVT prophylaxis: Lovenox Code Status: Full code Family Communication: Updated wife over the phone. Disposition Plan: Anticipate discharge in 1 to 2 days. Consults called: None Admission status: It is my clinical opinion that referral for OBSERVATION is reasonable and necessary in this patient based on the above information provided. The aforementioned taken together are felt to place the patient at high risk for further clinical deterioration. However it is anticipated that the patient may be medically stable for discharge from the hospital within 24 to 48 hours.  The medical decision making on this patient was of high complexity and the patient is at high risk for clinical deterioration, therefore this is a level 3 visit.  Shela Leff MD [facility] Pager [phone]  If 7PM-7AM, please contact night-coverage [email or web address] Password [email or web address]  12/28/2018, 1:08 AM

## 2018-12-27 NOTE — ED Notes (Signed)
Transport delayed due to MD at bedside.

## 2018-12-27 NOTE — ED Notes (Signed)
ED TO INPATIENT HANDOFF REPORT  ED Nurse Name and Phone #: Tillman Abide  S Name/Age/Gender Glendon Axe 63 y.o. male Room/Bed: WA06/WA06  Code Status   Code Status: Prior  Home/SNF/Other Given to floor Patient oriented to: self, place, time and situation Is this baseline? Yes   Triage Complete: Triage complete  Chief Complaint chemo pt / confused / dizzy   Triage Note Patient is complaining of hypotension and sodium level was 131 yesterday. He states he was given a liter of normal saline at urgent care yesterday.    Allergies Allergies  Allergen Reactions  . Amlodipine Swelling    edema  . Tetracycline Rash    Level of Care/Admitting Diagnosis ED Disposition    ED Disposition Condition [facility] Area: [place] [[id number]]  Level of Care: Med-Surg [16]  Covid Evaluation: Person Under Investigation (PUI)  Isolation Risk Level: Low Risk/Droplet (Less than 4L [place] supplementation)  Diagnosis: Acute encephalopathy [235573]  Admitting Physician: Shela Leff [[id number]]  Attending Physician: Shela Leff [[id number]]  PT Class (Do Not Modify): Observation [104]  PT Acc Code (Do Not Modify): Observation [10022]       B Medical/Surgery History Past Medical History:  Diagnosis Date  . Allergic rhinitis   . At risk for sleep apnea    STOP-BANG SCORE = 5    (routed to pt's pcp 09-08-2018)  . Chronic constipation   . Chronic low back pain   . Chronic pain   . DOE (dyspnea on exertion)   . History of kidney stones   . History of recent pneumonia 08/10/2018   acute bronchopneumonia;  follow-up cxr 08-28-2018 in epic  . History of vertebral compression fracture    T10 and T12 s/p kyphoplasty  . Hx of colonic polyps   . Hyperplasia of prostate with lower urinary tract symptoms (LUTS)   . Hypertension   . Mild persistent asthma    followed by pcp  . Numbness in both legs    secondary to a fall, per pt has had work-up  and couldn't find anything  . Osteopenia   . Prostate cancer metastatic to pelvis [facility]) urologist-- dr borden/  oncologist -- dr Alen Blew   dx 2014 advanced prostate cancer with pelvid adenopathy,  Stage T2c, Gleason 7;  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  . Wears glasses    Past Surgical History:  Procedure Laterality Date  . COLONOSCOPY    . IR KYPHO EA ADDL LEVEL THORACIC OR LUMBAR  08/17/2018  . IR KYPHO EA ADDL LEVEL THORACIC OR LUMBAR  08/26/2018  . IR KYPHO LUMBAR INC FX REDUCE BONE BX UNI/BIL CANNULATION INC/IMAGING  08/26/2018  . IR KYPHO THORACIC WITH BONE BIOPSY  08/17/2018  . IR RADIOLOGIST EVAL & MGMT  08/11/2018  . IR RADIOLOGIST EVAL & MGMT  08/24/2018  . LYMPHADENECTOMY Left 01/05/2013   Procedure: ROBOTIC LYMPHADENECTOMY;  Surgeon: Dutch Gray, MD;  Location: [facility] ORS;  Service: Urology;  Laterality: Left;  . PROSTATE BIOPSY     11/12 positive biopsies  . ROTATOR CUFF REPAIR Left 2012  . TRANSURETHRAL RESECTION OF BLADDER TUMOR N/A 09/15/2018   Procedure: TRANSURETHRAL RESECTION OF BLADDER TUMOR (TURBT)WITH CYSTOSCOPY/ POST OPERATIVE INSTILLATION OF GEMCITABINE;  Surgeon: Raynelle Bring, MD;  Location: [facility];  Service: Urology;  Laterality: N/A;  GENERAL ANESTHESIA WITH PARALYSIS  . UMBILICAL HERNIA REPAIR  09-02-2001   dr t. price @[facility]      A IV Location/Drains/Wounds Patient Lines/Drains/Airways Status   Active  Line/Drains/Airways    None          Intake/Output Last 24 hours  Intake/Output Summary (Last 24 hours) at 12/27/2018 2045 Last data filed at 12/27/2018 2033 Gross per 24 hour  Intake 1000 ml  Output -  Net 1000 ml    Labs/Imaging Results for orders placed or performed during the hospital encounter of 12/27/18 (from the past 48 hour(s))  Basic metabolic panel     Status: Abnormal   Collection Time: 12/27/18  2:35 PM  Result Value Ref Range   Sodium 127 (L) 135 - 145 mmol/L   Potassium 4.2 3.5 - 5.1 mmol/L   Chloride 96 (L) 98 - 111 mmol/L   CO2 21 (L) 22 - 32  mmol/L   Glucose, Bld 115 (H) 70 - 99 mg/dL   BUN 27 (H) 8 - 23 mg/dL   Creatinine, Ser 1.24 0.61 - 1.24 mg/dL   Calcium 9.0 8.9 - 10.3 mg/dL   GFR calc non Af Amer >60 >60 mL/min   GFR calc Af Amer >60 >60 mL/min   Anion gap 10 5 - 15    Comment: Performed at [facility], [place] [address] [place], [place] [address]  CBC     Status: Abnormal   Collection Time: 12/27/18  2:35 PM  Result Value Ref Range   WBC 7.6 4.0 - 10.5 K/uL   RBC 4.41 4.22 - 5.81 MIL/uL   Hemoglobin 13.1 13.0 - 17.0 g/dL   HCT 39.2 39.0 - 52.0 %   MCV 88.9 80.0 - 100.0 fL   MCH 29.7 26.0 - 34.0 pg   MCHC 33.4 30.0 - 36.0 g/dL   RDW 13.4 11.5 - 15.5 %   Platelets 127 (L) 150 - 400 K/uL   nRBC 0.0 0.0 - 0.2 %    Comment: Performed at [facility], [place] [address], [place], [place] [address]  CBG monitoring, ED     Status: Abnormal   Collection Time: 12/27/18  3:53 PM  Result Value Ref Range   Glucose-Capillary 107 (H) 70 - 99 mg/dL   Dg Lumbar Spine 2-3 Views  Result Date: 12/27/2018 CLINICAL DATA:  Acute on chronic low back pain. History of prior compression fractures status post kyphoplasty. EXAM: LUMBAR SPINE - 2-3 VIEW COMPARISON:  CT abdomen pelvis dated September 13, 2018. FINDINGS: Five lumbar type vertebral bodies. No acute fracture or subluxation. Unchanged chronic mild L3 central superior endplate compression fracture. Unchanged chronic T10, T12, L1, and L2 compression deformities status post cement augmentation. Unchanged trace retrolisthesis at L3-L4 and trace anterolisthesis at L5-S1. Unchanged bilateral L5 pars defects. Unchanged moderate disc height loss at L3-L4 and severe disc height loss at L4-L5. The sacroiliac joints are unremarkable. Aortoiliac atherosclerotic vascular disease. IMPRESSION: 1. No acute osseous abnormality. Multiple chronic thoracolumbar compression deformities are unchanged. 2. Unchanged moderate L3-L4 and severe L4-L5 degenerative disc disease.  Electronically Signed   By: Titus Dubin M.D.   On: 12/27/2018 17:00   Mr Jeri Cos QI Contrast  Result Date: 12/27/2018 CLINICAL DATA:  History of prostate cancer and new confusion, uncontrollable jerking. EXAM: MRI HEAD WITHOUT AND WITH CONTRAST TECHNIQUE: Multiplanar, multiecho pulse sequences of the brain and surrounding structures were obtained without and with intravenous contrast. CONTRAST:  Gadavist 8 mL COMPARISON:  CT head 09/13/2018. MR head 09/19/2018. MR brain 04/11/2018. CT head 04/11/2018. FINDINGS: There is significant motion degradation. The study is of borderline to marginal diagnostic utility. Brain: No restricted diffusion to suggest acute  infarct. No hemorrhage, mass lesion, hydrocephalus, or extra-axial fluid. Advanced for age cerebral and cerebellar atrophy. Mild subcortical and periventricular T2 and FLAIR hyperintensities, likely chronic microvascular ischemic change. There is minimal symmetric pachymeningeal enhancement, doubtful significance. No intra-axial lesions are seen. Vascular: Flow voids are maintained. Skull and upper cervical spine: There is asymmetric thickness of the LEFT parietal calvarium is compared to the RIGHT. This appearance is stable from 2019, and metastatic disease is not established. Sinuses/Orbits: No significant sinus or orbital disease. Other: No visible mastoid fluid. IMPRESSION: Motion degraded exam demonstrating atrophy, and small vessel disease, but no definite acute intracranial findings. No visible metastatic disease to the brain or extradural space. Slight asymmetric thickening of the LEFT parietal calvarium appears stable from prior imaging studies, and is not clearly pathologic, despite patient's history of prostate cancer. Electronically Signed   By: Staci Righter M.D.   On: 12/27/2018 18:59    Pending Labs Unresulted Labs (From admission, onward)    Start     Ordered   12/27/18 1937  SARS Coronavirus 2 (CEPHEID - Performed in [facility]  hospital lab), Hosp Order  (Asymptomatic Patients Labs)  Once,   R    Question:  Rule Out  Answer:  Yes   12/27/18 1936   12/27/18 1435  Urinalysis, Routine w reflex microscopic  Once,   STAT     12/27/18 1434          Vitals/Pain Today's Vitals   12/27/18 1700 12/27/18 1733 12/27/18 1738 12/27/18 2007  BP: 110/65  95/60 104/65  Pulse: 97  94 (!) 108  Resp: (!) 29  (!) 35 (!) 33  Temp:      TempSrc:      SpO2: 94%  100% 98%  Weight:      Height:      PainSc:  Asleep      Isolation Precautions No active isolations  Medications Medications  sodium chloride flush (NS) 0.9 % injection 3 mL (has no administration in time range)  HYDROmorphone (DILAUDID) injection 0.5 mg (0.5 mg Intravenous Given 12/27/18 1700)  sodium chloride 0.9 % bolus 1,000 mL (0 mLs Intravenous Stopped 12/27/18 2033)  LORazepam (ATIVAN) injection 1 mg (1 mg Intravenous Given 12/27/18 1709)  gadobutrol (GADAVIST) 1 MMOL/ML injection 8 mL (8 mLs Intravenous Contrast Given 12/27/18 1835)    Mobility walks with person assist Low fall risk   Focused Assessments Cardiac Assessment Handoff:  Cardiac Rhythm: Normal sinus rhythm Lab Results  Component Value Date   TROPONINI <0.03 09/12/2018   No results found for: DDIMER Does the Patient currently have chest pain? No     R Recommendations: See Admitting Provider Note  Report given to:   Additional Notes:

## 2018-12-27 NOTE — ED Provider Notes (Signed)
[place] DEPT Provider Note   CSN: [id number] Arrival date & time: 12/27/18  1418    History   Chief Complaint Chief Complaint  Patient presents with  . Low Blood Pressure    HPI [name] Holder is a 63 y.o. male.     HPI Patient presents with confusion, nausea, weakness, low back pain Patient has history of ongoing back pain, attributed to lesions secondary to prostate cancer Diagnosis was 2014, since that time the patient has become resistant to some therapy. He notes that over the past few days, he has had worsening weakness, nausea, generalized weakness no obvious fever. With concern for weakness he went to urgent care yesterday, was told he was hyponatremic, received fluids, was discharged. However, with worsening weakness, confusion he presents for evaluation.  Past Medical History:  Diagnosis Date  . Allergic rhinitis   . At risk for sleep apnea    STOP-BANG SCORE = 5    (routed to pt's pcp 09-08-2018)  . Chronic constipation   . Chronic low back pain   . Chronic pain   . DOE (dyspnea on exertion)   . History of kidney stones   . History of recent pneumonia 08/10/2018   acute bronchopneumonia;  follow-up cxr 08-28-2018 in epic  . History of vertebral compression fracture    T10 and T12 s/p kyphoplasty  . Hx of colonic polyps   . Hyperplasia of prostate with lower urinary tract symptoms (LUTS)   . Hypertension   . Mild persistent asthma    followed by pcp  . Numbness in both legs    secondary to a fall, per pt has had work-up and couldn't find anything  . Osteopenia   . Prostate cancer metastatic to pelvis [facility]) urologist-- dr borden/  oncologist -- dr Alen Blew   dx 2014 advanced prostate cancer with pelvid adenopathy,  Stage T2c, Gleason 7;  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  . Wears glasses     Patient Active Problem List   Diagnosis Date Noted  . Hordeolum externum of left lower eyelid 12/20/2018  . Orthostatic hypotension  11/28/2018  . Hyperglycemia 11/24/2018  . Pathologic compression fracture of spine, initial encounter ([place]) 09/13/2018  . Cancer associated pain 09/13/2018  . Bladder cancer ([place]) 09/13/2018  . Secondary malignant neoplasm of bone and bone marrow ([place]) 08/06/2018  . Spinal stenosis in cervical region 04/12/2018  . Routine general medical examination at a health care facility 08/29/2015  . Hyperlipidemia with target LDL less than 100 12/21/2013  . Upper airway cough syndrome 07/25/2013  . History of prostate cancer   . Essential hypertension 05/20/2010  . Allergic rhinitis 05/20/2010  . Asthma, mild intermittent 05/20/2010    Past Surgical History:  Procedure Laterality Date  . COLONOSCOPY    . IR KYPHO EA ADDL LEVEL THORACIC OR LUMBAR  08/17/2018  . IR KYPHO EA ADDL LEVEL THORACIC OR LUMBAR  08/26/2018  . IR KYPHO LUMBAR INC FX REDUCE BONE BX UNI/BIL CANNULATION INC/IMAGING  08/26/2018  . IR KYPHO THORACIC WITH BONE BIOPSY  08/17/2018  . IR RADIOLOGIST EVAL & MGMT  08/11/2018  . IR RADIOLOGIST EVAL & MGMT  08/24/2018  . LYMPHADENECTOMY Left 01/05/2013   Procedure: ROBOTIC LYMPHADENECTOMY;  Surgeon: Dutch Gray, MD;  Location: [facility];  Service: Urology;  Laterality: Left;  . PROSTATE BIOPSY     11/12 positive biopsies  . ROTATOR CUFF REPAIR Left 2012  . TRANSURETHRAL RESECTION OF BLADDER TUMOR N/A 09/15/2018   Procedure: TRANSURETHRAL  RESECTION OF BLADDER TUMOR (TURBT)WITH CYSTOSCOPY/ POST OPERATIVE INSTILLATION OF GEMCITABINE;  Surgeon: Raynelle Bring, MD;  Location: [facility];  Service: Urology;  Laterality: N/A;  GENERAL ANESTHESIA WITH PARALYSIS  . UMBILICAL HERNIA REPAIR  09-02-2001   dr t. price '@[facility]'$         Home Medications    Prior to Admission medications   Medication Sig Start Date End Date Taking? Authorizing Provider  abiraterone acetate (ZYTIGA) 250 MG tablet Take 1,000 mg by mouth daily.  08/17/17   [provider]  albuterol (PROAIR HFA) 108 (90 Base) MCG/ACT  inhaler 2 PUFFS EVERY 6 HOURS AS NEEDED FOR WHEEZING 11/24/18   [name], MD  albuterol (PROVENTIL) (2.5 MG/3ML) 0.083% nebulizer solution Take 3 mLs (2.5 mg total) by nebulization every 6 (six) hours as needed for wheezing or shortness of breath. 11/24/18   [name], MD  azelastine (ASTELIN) 0.1 % nasal spray Place 2 sprays into both nostrils every morning. Patient taking differently: Place 2 sprays into both nostrils daily as needed for allergies.  04/12/18   Johnson, Clanford L, MD  budesonide-formoterol (SYMBICORT) 160-4.5 MCG/ACT inhaler Inhale 2 puffs into the lungs 2 (two) times daily. 11/24/18   [name], MD  CALCIUM-VITAMIN D PO Take 1 tablet by mouth 2 (two) times daily.     [provider]  carvedilol (COREG) 3.125 MG tablet Take 1 tablet (3.125 mg total) by mouth 2 (two) times daily with a meal. 12/02/18   [name], MD  ciprofloxacin (CIPRO) 500 MG tablet  12/26/18   [provider]  cyanocobalamin (,VITAMIN B-12,) 1000 MCG/ML injection Inject 1,000 mcg into the muscle every 30 (thirty) days.  05/14/18   [provider]  diclofenac sodium (VOLTAREN) 1 % GEL Apply 4 g topically 4 (four) times daily. 09/21/18   Dahal, Marlowe Aschoff, MD  ipratropium-albuterol (DUONEB) 0.5-2.5 (3) MG/3ML SOLN Take 3 mLs by nebulization every 4 (four) hours as needed. 11/24/18   [name], MD  Leuprolide Acetate (LUPRON IJ) Inject 1 Dose as directed every 4 (four) months. This is administered by [facility]    [provider]  meclizine (ANTIVERT) 12.5 MG tablet Take 1 tablet (12.5 mg total) by mouth 3 (three) times daily as needed for dizziness. 12/22/18   [name], MD  mometasone (NASONEX) 50 MCG/ACT nasal spray Place 2 sprays into the nose daily. 11/24/18   [name], MD  NARCAN 4 MG/0.1ML LIQD nasal spray kit Place 0.4 mg into the nose once.  08/18/18   [provider]  oxyCODONE (OXY IR/ROXICODONE) 5 MG immediate release tablet  Take 1 tablet (5 mg total) by mouth every 4 (four) hours as needed for severe pain. 11/18/18   Wyatt Portela, MD  predniSONE (DELTASONE) 5 MG tablet Take 1 tablet (5 mg total) by mouth daily with breakfast. Patient taking differently: Take 5 mg by mouth 2 (two) times daily with a meal.  09/21/18   Dahal, Marlowe Aschoff, MD  sulfamethoxazole-trimethoprim (BACTRIM DS) 800-160 MG tablet Take 2 tablets by mouth 2 (two) times daily for 7 days. 12/20/18 12/27/18  [name], MD  SYRINGE-NEEDLE, DISP, 3 ML 3 ML MISC Use to inject b12 monthly. 04/19/18   [name], MD  tamsulosin (FLOMAX) 0.4 MG CAPS capsule Take 1 capsule (0.4 mg total) by mouth daily. 09/22/18   Terrilee Croak, MD    Family History Family History  Problem Relation Age of Onset  . Arthritis Mother   .  Diabetes Mother   . Hypertension Mother   . Hyperlipidemia Mother   . Hypertension Father   . Prostate cancer Father        radiation in mid 33s  . Heart attack Father   . Cancer Sister        precancerous colon polyps, 6 in colon removed  . Stroke Maternal Grandfather     Social History Social History   Tobacco Use  . Smoking status: Former Smoker    Packs/day: 0.50    Years: 25.00    Pack years: 12.50    Types: Cigarettes    Last attempt to quit: 09/08/2012    Years since quitting: 6.3  . Smokeless tobacco: Never Used  Substance Use Topics  . Alcohol use: Not Currently    Alcohol/week: 2.0 standard drinks    Types: 2 Glasses of wine per week  . Drug use: Never     Allergies   Amlodipine and Tetracycline   Review of Systems Review of Systems  Constitutional:       Per HPI, otherwise negative  HENT:       Per HPI, otherwise negative  Respiratory:       Per HPI, otherwise negative  Cardiovascular:       Per HPI, otherwise negative  Gastrointestinal: Positive for nausea. Negative for vomiting.  Endocrine:       Negative aside from HPI  Genitourinary:       Neg aside from HPI   Musculoskeletal:        Per HPI, otherwise negative  Skin: Negative.   Allergic/Immunologic: Positive for immunocompromised state.  Neurological: Positive for weakness. Negative for syncope.     Physical Exam Updated Vital Signs BP 95/60 (BP Location: Right Arm)   Pulse 94   Temp 98 F (36.7 C) (Oral)   Resp (!) 35   Ht '5\' 7"'$  (1.702 m)   Wt 78.9 kg   SpO2 100%   BMI 27.25 kg/m   Physical Exam Vitals signs and nursing note reviewed.  Constitutional:      General: He is not in acute distress.    Appearance: He is well-developed. He is ill-appearing.  HENT:     Head: Normocephalic and atraumatic.  Eyes:     Conjunctiva/sclera: Conjunctivae normal.  Cardiovascular:     Rate and Rhythm: Normal rate and regular rhythm.  Pulmonary:     Effort: Pulmonary effort is normal. No respiratory distress.     Breath sounds: No stridor.  Abdominal:     General: There is no distension.  Skin:    General: Skin is warm and dry.  Neurological:     Mental Status: He is alert and oriented to person, place, and time.     Comments: Resolved extremity spontaneously, has a diffuse appreciable atrophy No facial asymmetry, speech is clear, brief.      ED Treatments / Results  Labs (all labs ordered are listed, but only abnormal results are displayed) Labs Reviewed  BASIC METABOLIC PANEL - Abnormal; Notable for the following components:      Result Value   Sodium 127 (*)    Chloride 96 (*)    CO2 21 (*)    Glucose, Bld 115 (*)    BUN 27 (*)    All other components within normal limits  CBC - Abnormal; Notable for the following components:   Platelets 127 (*)    All other components within normal limits  CBG MONITORING, ED - Abnormal; Notable for the  following components:   Glucose-Capillary 107 (*)    All other components within normal limits  SARS CORONAVIRUS 2 (HOSPITAL ORDER, [facility])  URINALYSIS, ROUTINE W REFLEX MICROSCOPIC    EKG EKG Interpretation  Date/Time:   Tuesday December 27 2018 14:36:27 EDT Ventricular Rate:  95 PR Interval:    QRS Duration: 104 QT Interval:  340 QTC Calculation: 428 R Axis:   90 Text Interpretation:  Sinus tachycardia Atrial premature complex Borderline right axis deviation Low voltage, extremity and precordial leads Baseline wander in lead(s) II V2 Abnormal ECG Confirmed by Carmin Muskrat [phone]) on 12/27/2018 4:13:48 PM   Radiology Dg Lumbar Spine 2-3 Views  Result Date: 12/27/2018 CLINICAL DATA:  Acute on chronic low back pain. History of prior compression fractures status post kyphoplasty. EXAM: LUMBAR SPINE - 2-3 VIEW COMPARISON:  CT abdomen pelvis dated September 13, 2018. FINDINGS: Five lumbar type vertebral bodies. No acute fracture or subluxation. Unchanged chronic mild L3 central superior endplate compression fracture. Unchanged chronic T10, T12, L1, and L2 compression deformities status post cement augmentation. Unchanged trace retrolisthesis at L3-L4 and trace anterolisthesis at L5-S1. Unchanged bilateral L5 pars defects. Unchanged moderate disc height loss at L3-L4 and severe disc height loss at L4-L5. The sacroiliac joints are unremarkable. Aortoiliac atherosclerotic vascular disease. IMPRESSION: 1. No acute osseous abnormality. Multiple chronic thoracolumbar compression deformities are unchanged. 2. Unchanged moderate L3-L4 and severe L4-L5 degenerative disc disease. Electronically Signed   By: Titus Dubin M.D.   On: 12/27/2018 17:00   Mr Jeri Cos AY Contrast  Result Date: 12/27/2018 CLINICAL DATA:  History of prostate cancer and new confusion, uncontrollable jerking. EXAM: MRI HEAD WITHOUT AND WITH CONTRAST TECHNIQUE: Multiplanar, multiecho pulse sequences of the brain and surrounding structures were obtained without and with intravenous contrast. CONTRAST:  Gadavist 8 mL COMPARISON:  CT head 09/13/2018. MR head 09/19/2018. MR brain 04/11/2018. CT head 04/11/2018. FINDINGS: There is significant motion degradation. The  study is of borderline to marginal diagnostic utility. Brain: No restricted diffusion to suggest acute infarct. No hemorrhage, mass lesion, hydrocephalus, or extra-axial fluid. Advanced for age cerebral and cerebellar atrophy. Mild subcortical and periventricular T2 and FLAIR hyperintensities, likely chronic microvascular ischemic change. There is minimal symmetric pachymeningeal enhancement, doubtful significance. No intra-axial lesions are seen. Vascular: Flow voids are maintained. Skull and upper cervical spine: There is asymmetric thickness of the LEFT parietal calvarium is compared to the RIGHT. This appearance is stable from 2019, and metastatic disease is not established. Sinuses/Orbits: No significant sinus or orbital disease. Other: No visible mastoid fluid. IMPRESSION: Motion degraded exam demonstrating atrophy, and small vessel disease, but no definite acute intracranial findings. No visible metastatic disease to the brain or extradural space. Slight asymmetric thickening of the LEFT parietal calvarium appears stable from prior imaging studies, and is not clearly pathologic, despite patient's history of prostate cancer. Electronically Signed   By: Staci Righter M.D.   On: 12/27/2018 18:59    Procedures Procedures (including critical care time)  Medications Ordered in ED Medications  sodium chloride flush (NS) 0.9 % injection 3 mL (has no administration in time range)  HYDROmorphone (DILAUDID) injection 0.5 mg (0.5 mg Intravenous Given 12/27/18 1700)  sodium chloride 0.9 % bolus 1,000 mL (1,000 mLs Intravenous New Bag/Given 12/27/18 1701)  LORazepam (ATIVAN) injection 1 mg (1 mg Intravenous Given 12/27/18 1709)  gadobutrol (GADAVIST) 1 MMOL/ML injection 8 mL (8 mLs Intravenous Contrast Given 12/27/18 1835)     Initial Impression / Assessment and  Plan / ED Course  I have reviewed the triage vital signs and the nursing notes.  Pertinent labs & imaging results that were available during my care of  the patient were reviewed by me and considered in my medical decision making (see chart for details).        7:37 PM On repeat exam the patient is resting. With the wife on the telephone I reviewed the findings.  Clearly the patient's provided history was incomplete, wife notes that he has had difficulty with balance and electrolytes over the past month, and yesterday was seen at urgent care, did receive fluids, and received Cipro for concern for possible prostatitis. She notes that overnight the patient had substantial amounts of diarrhea, though she does not report a fever. She notes that he has been increasingly confused over the past few days, including having several falls, all uncharacteristic for him. With labs concerning for acute kidney injury, with a doubling of his creatinine since last month, sodium value of 127, and after mentioned confusion, the patient will require admission for further evaluation, monitoring, management.  He has received initial fluids here, respiratory rate has diminished, blood pressure has improved.  Final Clinical Impressions(s) / ED Diagnoses   Final diagnoses:  Hyponatremia  AKI (acute kidney injury) [facility])  Confusion    ED Discharge Orders    None       Carmin Muskrat, MD 12/27/18 1939

## 2018-12-27 NOTE — ED Notes (Signed)
Pt found voiding into belongings bag due to not being able to find urinal. Pt had urinal beside him as well as call bell. Pt ripped cardiac monitoring off again, as well as gown. Pt repositioned in bed and given call bell. Unable to collect urine specimen due to pt voiding in bag.

## 2018-12-28 DIAGNOSIS — Z87442 Personal history of urinary calculi: Secondary | ICD-10-CM | POA: Diagnosis not present

## 2018-12-28 DIAGNOSIS — Z9079 Acquired absence of other genital organ(s): Secondary | ICD-10-CM | POA: Diagnosis not present

## 2018-12-28 DIAGNOSIS — R410 Disorientation, unspecified: Secondary | ICD-10-CM | POA: Diagnosis present

## 2018-12-28 DIAGNOSIS — N390 Urinary tract infection, site not specified: Secondary | ICD-10-CM | POA: Diagnosis present

## 2018-12-28 DIAGNOSIS — D6959 Other secondary thrombocytopenia: Secondary | ICD-10-CM | POA: Diagnosis present

## 2018-12-28 DIAGNOSIS — G8928 Other chronic postprocedural pain: Secondary | ICD-10-CM | POA: Diagnosis present

## 2018-12-28 DIAGNOSIS — C689 Malignant neoplasm of urinary organ, unspecified: Secondary | ICD-10-CM | POA: Diagnosis present

## 2018-12-28 DIAGNOSIS — E860 Dehydration: Secondary | ICD-10-CM | POA: Diagnosis present

## 2018-12-28 DIAGNOSIS — Z20828 Contact with and (suspected) exposure to other viral communicable diseases: Secondary | ICD-10-CM | POA: Diagnosis present

## 2018-12-28 DIAGNOSIS — C61 Malignant neoplasm of prostate: Secondary | ICD-10-CM | POA: Diagnosis present

## 2018-12-28 DIAGNOSIS — I10 Essential (primary) hypertension: Secondary | ICD-10-CM | POA: Diagnosis present

## 2018-12-28 DIAGNOSIS — M4856XD Collapsed vertebra, not elsewhere classified, lumbar region, subsequent encounter for fracture with routine healing: Secondary | ICD-10-CM | POA: Diagnosis present

## 2018-12-28 DIAGNOSIS — A419 Sepsis, unspecified organism: Secondary | ICD-10-CM | POA: Diagnosis present

## 2018-12-28 DIAGNOSIS — R197 Diarrhea, unspecified: Secondary | ICD-10-CM

## 2018-12-28 DIAGNOSIS — G9340 Encephalopathy, unspecified: Secondary | ICD-10-CM | POA: Diagnosis not present

## 2018-12-28 DIAGNOSIS — Z87891 Personal history of nicotine dependence: Secondary | ICD-10-CM | POA: Diagnosis not present

## 2018-12-28 DIAGNOSIS — C7951 Secondary malignant neoplasm of bone: Secondary | ICD-10-CM | POA: Diagnosis present

## 2018-12-28 DIAGNOSIS — Z7951 Long term (current) use of inhaled steroids: Secondary | ICD-10-CM | POA: Diagnosis not present

## 2018-12-28 DIAGNOSIS — J4530 Mild persistent asthma, uncomplicated: Secondary | ICD-10-CM | POA: Diagnosis present

## 2018-12-28 DIAGNOSIS — G92 Toxic encephalopathy: Secondary | ICD-10-CM | POA: Diagnosis present

## 2018-12-28 DIAGNOSIS — E222 Syndrome of inappropriate secretion of antidiuretic hormone: Secondary | ICD-10-CM | POA: Diagnosis present

## 2018-12-28 DIAGNOSIS — N400 Enlarged prostate without lower urinary tract symptoms: Secondary | ICD-10-CM | POA: Diagnosis present

## 2018-12-28 DIAGNOSIS — E872 Acidosis: Secondary | ICD-10-CM | POA: Diagnosis present

## 2018-12-28 DIAGNOSIS — Z8701 Personal history of pneumonia (recurrent): Secondary | ICD-10-CM | POA: Diagnosis not present

## 2018-12-28 DIAGNOSIS — E871 Hypo-osmolality and hyponatremia: Secondary | ICD-10-CM

## 2018-12-28 DIAGNOSIS — N179 Acute kidney failure, unspecified: Secondary | ICD-10-CM | POA: Diagnosis present

## 2018-12-28 DIAGNOSIS — D649 Anemia, unspecified: Secondary | ICD-10-CM

## 2018-12-28 DIAGNOSIS — M4854XD Collapsed vertebra, not elsewhere classified, thoracic region, subsequent encounter for fracture with routine healing: Secondary | ICD-10-CM | POA: Diagnosis present

## 2018-12-28 DIAGNOSIS — M8580 Other specified disorders of bone density and structure, unspecified site: Secondary | ICD-10-CM | POA: Diagnosis present

## 2018-12-28 LAB — HEPATIC FUNCTION PANEL
ALT: 33 U/L (ref 0–44)
AST: 33 U/L (ref 15–41)
Albumin: 3.4 g/dL — ABNORMAL LOW (ref 3.5–5.0)
Alkaline Phosphatase: 52 U/L (ref 38–126)
Bilirubin, Direct: 0.1 mg/dL (ref 0.0–0.2)
Indirect Bilirubin: 0.5 mg/dL (ref 0.3–0.9)
Total Bilirubin: 0.6 mg/dL (ref 0.3–1.2)
Total Protein: 5.6 g/dL — ABNORMAL LOW (ref 6.5–8.1)

## 2018-12-28 LAB — BASIC METABOLIC PANEL
Anion gap: 7 (ref 5–15)
Anion gap: 8 (ref 5–15)
BUN: 24 mg/dL — ABNORMAL HIGH (ref 8–23)
BUN: 22 mg/dL (ref 8–23)
CO2: 18 mmol/L — ABNORMAL LOW (ref 22–32)
CO2: 18 mmol/L — ABNORMAL LOW (ref 22–32)
Calcium: 8.5 mg/dL — ABNORMAL LOW (ref 8.9–10.3)
Calcium: 8.7 mg/dL — ABNORMAL LOW (ref 8.9–10.3)
Chloride: 100 mmol/L (ref 98–111)
Chloride: 101 mmol/L (ref 98–111)
Creatinine, Ser: 0.88 mg/dL (ref 0.61–1.24)
Creatinine, Ser: 0.97 mg/dL (ref 0.61–1.24)
GFR calc Af Amer: >60 mL/min (ref >60)
GFR calc Af Amer: >60 mL/min (ref >60)
GFR calc non Af Amer: >60 mL/min (ref >60)
GFR calc non Af Amer: >60 mL/min (ref >60)
Glucose, Bld: 103 mg/dL — ABNORMAL HIGH (ref 70–99)
Glucose, Bld: 84 mg/dL (ref 70–99)
Potassium: 3.6 mmol/L (ref 3.5–5.1)
Potassium: 3.7 mmol/L (ref 3.5–5.1)
Sodium: 125 mmol/L — ABNORMAL LOW (ref 135–145)
Sodium: 127 mmol/L — ABNORMAL LOW (ref 135–145)

## 2018-12-28 LAB — CBC
HCT: 36.8 % — ABNORMAL LOW (ref 39.0–52.0)
Hemoglobin: 12.5 g/dL — ABNORMAL LOW (ref 13.0–17.0)
MCH: 30.3 pg (ref 26.0–34.0)
MCHC: 34 g/dL (ref 30.0–36.0)
MCV: 89.1 fL (ref 80.0–100.0)
Platelets: 78 10*3/uL — ABNORMAL LOW (ref 150–400)
RBC: 4.13 MIL/uL — ABNORMAL LOW (ref 4.22–5.81)
RDW: 13.7 % (ref 11.5–15.5)
WBC: 4.5 10*3/uL (ref 4.0–10.5)
nRBC: 0 % (ref 0.0–0.2)

## 2018-12-28 LAB — URINALYSIS, ROUTINE W REFLEX MICROSCOPIC
Bacteria, UA: NONE SEEN
Bilirubin Urine: NEGATIVE
Glucose, UA: NEGATIVE mg/dL
Hgb urine dipstick: NEGATIVE
Ketones, ur: 5 mg/dL — AB
Leukocytes,Ua: NEGATIVE
Nitrite: NEGATIVE
Protein, ur: 30 mg/dL — AB
Specific Gravity, Urine: 1.027 (ref 1.005–1.030)
pH: 5 (ref 5.0–8.0)

## 2018-12-28 LAB — CREATININE, URINE, RANDOM: Creatinine, Urine: 158.49 mg/dL

## 2018-12-28 LAB — URINE CULTURE: Culture: NO GROWTH

## 2018-12-28 LAB — C DIFFICILE QUICK SCREEN W PCR REFLEX
C Diff antigen: NEGATIVE
C Diff interpretation: NOT DETECTED
C Diff toxin: NEGATIVE

## 2018-12-28 LAB — SODIUM, URINE, RANDOM: Sodium, Ur: 39 mmol/L

## 2018-12-28 LAB — OSMOLALITY: Osmolality: 264 mOsm/kg — ABNORMAL LOW (ref 275–295)

## 2018-12-28 MED ORDER — AZELASTINE HCL 0.1 % NA SOLN
2.0000 | Freq: Every day | NASAL | Status: DC | PRN
  Filled 2018-12-28: qty 30

## 2018-12-28 MED ORDER — CARVEDILOL 3.125 MG PO TABS
3.1250 mg | ORAL_TABLET | Freq: Two times a day (BID) | ORAL | Status: DC
  Administered 2018-12-28 – 2018-12-30 (×5): 3.125 mg via ORAL
  Filled 2018-12-28 (×5): qty 1

## 2018-12-28 MED ORDER — PREDNISONE 5 MG PO TABS
5.0000 mg | ORAL_TABLET | Freq: Two times a day (BID) | ORAL | Status: DC
  Administered 2018-12-28 – 2018-12-30 (×5): 5 mg via ORAL
  Filled 2018-12-28 (×5): qty 1

## 2018-12-28 MED ORDER — LOPERAMIDE HCL 2 MG PO CAPS: 2.0000 mg | ORAL_CAPSULE | ORAL | Status: DC | PRN

## 2018-12-28 MED ORDER — ABIRATERONE ACETATE 250 MG PO TABS
1000.0000 mg | ORAL_TABLET | Freq: Every day | ORAL | Status: DC
  Administered 2018-12-30: 1000 mg via ORAL

## 2018-12-28 MED ORDER — B COMPLEX-C PO TABS
1.0000 | ORAL_TABLET | Freq: Every day | ORAL | Status: DC
  Administered 2018-12-28 – 2018-12-30 (×3): 1 via ORAL
  Filled 2018-12-28 (×3): qty 1

## 2018-12-28 MED ORDER — FLUTICASONE PROPIONATE 50 MCG/ACT NA SUSP
2.0000 | Freq: Every day | NASAL | Status: DC
  Administered 2018-12-28 – 2018-12-30 (×3): 2 via NASAL
  Filled 2018-12-28: qty 16

## 2018-12-28 MED ORDER — SODIUM CHLORIDE 0.9 % IV SOLN
INTRAVENOUS | Status: DC
  Administered 2018-12-28 – 2018-12-30 (×3): via INTRAVENOUS

## 2018-12-28 MED ORDER — ALBUTEROL SULFATE (2.5 MG/3ML) 0.083% IN NEBU
2.5000 mg | INHALATION_SOLUTION | Freq: Four times a day (QID) | RESPIRATORY_TRACT | Status: DC | PRN
  Administered 2018-12-29 – 2018-12-30 (×3): 2.5 mg via RESPIRATORY_TRACT
  Filled 2018-12-28 (×4): qty 3

## 2018-12-28 MED ORDER — MOMETASONE FURO-FORMOTEROL FUM 200-5 MCG/ACT IN AERO
2.0000 | INHALATION_SPRAY | Freq: Two times a day (BID) | RESPIRATORY_TRACT | Status: DC
  Administered 2018-12-28 – 2018-12-30 (×5): 2 via RESPIRATORY_TRACT
  Filled 2018-12-28: qty 8.8

## 2018-12-28 MED ORDER — KETOROLAC TROMETHAMINE 15 MG/ML IJ SOLN
15.0000 mg | Freq: Four times a day (QID) | INTRAMUSCULAR | Status: DC | PRN
  Administered 2018-12-29 – 2018-12-30 (×2): 15 mg via INTRAVENOUS
  Filled 2018-12-28 (×3): qty 1

## 2018-12-28 MED ORDER — OXYCODONE HCL 5 MG PO TABS: 2.5000 mg | ORAL_TABLET | Freq: Three times a day (TID) | ORAL | Status: DC | PRN

## 2018-12-28 NOTE — Progress Notes (Signed)
Patient requesting to have pain medication restarted. Dr. Verlon Au made aware and has given specific orders for Toradol as 1st choice for pain relief and oxy as second choice for severe pain. MD does not want to escalate beyond this for pain relief at this time due to patients admission for confusion.

## 2018-12-28 NOTE — Progress Notes (Signed)
[facility] PROGRESS NOTE  [name] Holder SWF:[id number] DOB: 1955-09-14 DOA: 12/27/2018 PCP: [name], MD   Narrative: 63 year old male Prostate cancer/castration 2014 on Zytiga + prednisone urothelial CA status post transurethral resection tumor-Dr. Alinda Money Compression fracture + kyphoplasty T10, T12, L1, L2-chronic pain HTN Asthma  Most recent admission 2/17-2/27 2020  Was 2/2 toxic metabolic encephalopathy secondary to MS Contin had extensive work-up at that time  Returns to the hospital 6/2 with metabolic encephalopathy-on surgery history from wife-has been having fevers at home has been receiving Tylenol--on 5/25 received Bactrim for his eye from PCP--went to urgent care 6/2 received Cipro and also had foul-smelling diarrhea from this apparently  Labs on admission showed BUN/creatinine 27/1.2 sodium 127 platelet 1 27,000 MRI negative urine analysis pending   A & Plan Toxic metabolic encephalopathy/Delirium Multifactorial-suspect medication versus hyponatremia versus subtle beginnings of dementia When I ask him about if he has been confused over the past year, he agrees and thinks that this is "just an effect of aging"--I spoke to his wife in some detail and she tells me that he is usually not confused at all Discontinue oxycodone completely, follow urine culture as last hospitalization grew enterococcus-risk factors enlarged prostate Needs sitter at this time Recently prescribed by PCP meclizine would discontinue this and not use We will require safety sitter overnight given he is not back to baseline, we will reorient and raise the window shades and keep sleep wakefulness cycle Diarrhea/early sepsis Thrombocytopenia likely related to this C. difficile is negative however because of continued diarrhea today I would keep him on contact precautions and check a GI pathogen panel to ensure no other pathogen Platelet counts 11/25/2018 were within normal range he is dropped to  the 70s I would discontinue heparinoids and if drops further would get SCDs for the patient and HIT panel ?  UTI He was prescribed Cipro 1 to 2 days prior to admission-because of his diarrhea I would hold any antibiotics at this stage and watch closely urine culture and judiciously use antibiotics based on this data Probable SIADH sodium was 127, urine sodium is 39 (low) osmolality 264 (low) confirming the same Would fluid restrict 1200 cc--repeat labs in a.m. and continue saline to replete Expect this will help to some degree-would discontinue Benicar completely as this has been implicated in SIADH in the past AKI Mild metabolic acidosis secondary to AKI Discontinue Benicar completely 40 mg, hold Bactrim Continue saline 75 cc/H-not at baseline Prostate cancer castrate resistant on Zytiga prednisone We will alert his oncologist as a CC courtesy that patient is here MRI brain is negative so do not think delirium is from this Continue prednisone 5, Zytiga at this time Low-grade urothelial CA status post resection Outpatient follow-up with Dr. Mills Koller at this time Resume Flomax 0.4 Chronic pain secondary to multiple lumbar surgery Unfortunately will use only Tylenol at this stage he is confused and would hesitate to use anything that has potential to sedate or is on the beers criteria HTN Only Coreg at this time Asthma BMI 27   DVT Lovenox code Status: Full communication: Long discussion with wife at bedside disposition Plan: Patient not at baseline still confused and needing correction of multiple metabolic abnormalities urine cultures pending, awaiting stool studies   Verlon Au, MD  [facility] Via [employer] app OR [email or web address] 7PM-7AM contact night coverage as above 12/28/2018, 11:56 AM  LOS: 0 days   Consultants:  None  Procedures:  No  Antimicrobials:  None at this time  Interval history/Subjective: Awake alert pleasant no distress-oriented to place person but  falls asleep according to NT who is a sitter and becomes confused on awakening and it takes him time to reorient Cannot tell me some baseline information i.e. his wife is a nurse-he used to work in [place] but otherwise does not seem to orient completely Poor appetite in the morning according to NT lunch is hardly touched  Objective:  Vitals:  Vitals:   12/28/18 0504 12/28/18 0745  BP: (!) 110/59   Pulse: 100   Resp: 18 17  Temp: 99.3 F (37.4 C)   SpO2: 98% 97%    Exam:  Awake pleasant alert looking about stated age mild pallor no icterus EOMI NCAT No submandibular lymphadenopathy, Mallampati 2 S1-S2 no murmur rub or gallop Abdomen soft no rebound no guarding No lower extremity edema Neurologically intact able to move all 4 limbs equally power 5/5   I have personally reviewed the following:  DATA   Labs:  Sodium 127 BUN/creatinine 22/0.9 (baseline prior to this is 18/0.6)  B12 697 osmolality 264 ammonia less than 9   Imaging studies:  n  Medical tests:  n   Test discussed with performing physician:  n  Decision to obtain old records:  Yes  Review and summation of old records:  Yes  Scheduled Meds: . abiraterone acetate  1,000 mg Oral Daily  . B-complex with vitamin C  1 tablet Oral Daily  . carvedilol  3.125 mg Oral BID WC  . enoxaparin (LOVENOX) injection  40 mg Subcutaneous Q24H  . fluticasone  2 spray Each Nare Daily  . mometasone-formoterol  2 puff Inhalation BID  . predniSONE  5 mg Oral BID WC  . sodium chloride flush  3 mL Intravenous Once   Continuous Infusions: . sodium chloride 100 mL/hr (12/28/18 0850)    Principal Problem:   Acute encephalopathy Active Problems:   Hyponatremia   AKI (acute kidney injury) ([place])   Diarrhea   Anemia   LOS: 0 days

## 2018-12-28 NOTE — Evaluation (Signed)
Physical Therapy Evaluation Patient Details Name: [name] Holder MRN: [id number] DOB: [birth year] Today's Date: 12/28/2018   History of Present Illness  63 y.o. male with medical history significant of castration resistant prostate cancer, multiple compression fractures causing chronic pain, history of kyphoplasty of thoracic and lumbar spine, chronic pain, nephrolithiasis, BPH, urothelial carcinoma status post transurethral resection of bladder tumor, hypertension, asthma, and conditions listed below presenting to the hospital for evaluation of confusion.  Pt admitted for acute encephalopathy and hyponatremia.  Clinical Impression  Pt admitted with above diagnosis. Pt currently with functional limitations due to the deficits listed below (see PT Problem List).  Pt initially agreeable to at least attempt standing however upon sitting EOB, pt returned to supine and declined further activity at this time.  Pt's mobility likely limited by decreased cognition.  Pt's sitter reports abnormal behaviors of pt this morning.  Sitter encouraged to assist pt with eating meals at EOB (defer OOB for safety with decreased cognition or use +2 assist for safety) as pt with frequent back pain.  Pt will benefit from skilled PT to increase their independence and safety with mobility to allow discharge to the venue listed below.  Anticipate mobility will improve with improvement of cognition.     Follow Up Recommendations Home health PT;Supervision/Assistance - 24 hour    Equipment Recommendations  None recommended by PT    Recommendations for Other Services       Precautions / Restrictions Precautions Precautions: Fall;Back Precaution Comments: pt reports he has back brace however does not wear      Mobility  Bed Mobility Overal bed mobility: Needs Assistance Bed Mobility: Rolling;Sidelying to Sit;Sit to Sidelying Rolling: Supervision Sidelying to sit: Supervision     Sit to sidelying:  Supervision General bed mobility comments: pt recalls log roll technique, instructed to sit up on right side of bed, pt rolled over to left side almost to prone, cued again and pt able to sit up on Right side however immediately returned to supine; pt then declined to mobilize (was agreeable prior to at least initiate standing activity)  Transfers                 General transfer comment: pt declined  Ambulation/Gait                Stairs            Wheelchair Mobility    Modified Rankin (Stroke Patients Only)       Balance                                             Pertinent Vitals/Pain Pain Assessment: Faces Faces Pain Scale: Hurts little more Pain Location: back with mobility Pain Descriptors / Indicators: Sore Pain Intervention(s): Limited activity within patient's tolerance;Repositioned;Monitored during session;Premedicated before session    Home Living Family/patient expects to be discharged to:: Private residence Living Arrangements: Spouse/significant other Available Help at Discharge: Family Type of Home: House Home Access: Stairs to enter Entrance Stairs-Rails: None Entrance Stairs-Number of Steps: 3 Home Layout: One level Home Equipment: Environmental consultant - 2 wheels;Hospital bed;Bedside commode Additional Comments: above per recent admission, pt questionable historian at this time - decreased cognition at this time    Prior Function Level of Independence: Needs assistance   Gait / Transfers Assistance Needed: pt reports use of RW and limited ambulation  ADL's /  Homemaking Assistance Needed: Wife assists pt with ADLs        Hand Dominance        Extremity/Trunk Assessment        Lower Extremity Assessment Lower Extremity Assessment: Generalized weakness       Communication   Communication: No difficulties  Cognition Arousal/Alertness: Awake/alert Behavior During Therapy: WFL for tasks assessed/performed Overall  Cognitive Status: No family/caregiver present to determine baseline cognitive functioning                                 General Comments: admitted with AMS, pt likely not back to baseline, able to follow simple commands however inconsistently      General Comments      Exercises     Assessment/Plan    PT Assessment Patient needs continued PT services  PT Problem List Decreased strength;Decreased mobility;Decreased activity tolerance;Decreased balance;Decreased knowledge of use of DME;Decreased knowledge of precautions;Decreased cognition;Decreased safety awareness       PT Treatment Interventions DME instruction;Therapeutic activities;Gait training;Therapeutic exercise;Patient/family education;Functional mobility training;Stair training;Balance training    PT Goals (Current goals can be found in the Care Plan section)  Acute Rehab PT Goals PT Goal Formulation: Patient unable to participate in goal setting Time For Goal Achievement: 01/11/19 Potential to Achieve Goals: Good    Frequency Min 3X/week   Barriers to discharge        Co-evaluation               AM-PAC PT "6 Clicks" Mobility  Outcome Measure Help needed turning from your back to your side while in a flat bed without using bedrails?: A Little Help needed moving from lying on your back to sitting on the side of a flat bed without using bedrails?: A Little Help needed moving to and from a bed to a chair (including a wheelchair)?: A Little Help needed standing up from a chair using your arms (e.g., wheelchair or bedside chair)?: A Lot Help needed to walk in hospital room?: A Lot Help needed climbing 3-5 steps with a railing? : A Lot 6 Click Score: 15    End of Session   Activity Tolerance: Other (comment)(limited by cognition) Patient left: in bed;with call bell/phone within reach;with bed alarm set;with nursing/sitter in room   PT Visit Diagnosis: Difficulty in walking, not elsewhere  classified (R26.2)    Time: 1026-1040 PT Time Calculation (min) (ACUTE ONLY): 14 min   Charges:   PT Evaluation $PT Eval Low Complexity: [place], PT, DPT Acute Rehabilitation Services Office: [phone] Pager: [phone]  Trena Platt 12/28/2018, 12:57 PM

## 2018-12-29 LAB — GASTROINTESTINAL PANEL BY PCR, STOOL (REPLACES STOOL CULTURE)

## 2018-12-29 LAB — CBC WITH DIFFERENTIAL/PLATELET
Abs Immature Granulocytes: 0.04 10*3/uL (ref 0.00–0.07)
Basophils Absolute: 0 10*3/uL (ref 0.0–0.1)
Basophils Relative: 1 %
Eosinophils Absolute: 0.1 10*3/uL (ref 0.0–0.5)
Eosinophils Relative: 2 %
HCT: 33.1 % — ABNORMAL LOW (ref 39.0–52.0)
Hemoglobin: 11.1 g/dL — ABNORMAL LOW (ref 13.0–17.0)
Immature Granulocytes: 1 %
Lymphocytes Relative: 18 %
Lymphs Abs: 0.7 10*3/uL (ref 0.7–4.0)
MCH: 29.7 pg (ref 26.0–34.0)
MCHC: 33.5 g/dL (ref 30.0–36.0)
MCV: 88.5 fL (ref 80.0–100.0)
Monocytes Absolute: 0.1 10*3/uL (ref 0.1–1.0)
Monocytes Relative: 3 %
Neutro Abs: 3 10*3/uL (ref 1.7–7.7)
Neutrophils Relative %: 75 %
Platelets: 87 10*3/uL — ABNORMAL LOW (ref 150–400)
RBC: 3.74 MIL/uL — ABNORMAL LOW (ref 4.22–5.81)
RDW: 13.4 % (ref 11.5–15.5)
WBC: 4 10*3/uL (ref 4.0–10.5)
nRBC: 0 % (ref 0.0–0.2)

## 2018-12-29 LAB — COMPREHENSIVE METABOLIC PANEL
ALT: 33 U/L (ref 0–44)
AST: 23 U/L (ref 15–41)
Albumin: 3.1 g/dL — ABNORMAL LOW (ref 3.5–5.0)
Alkaline Phosphatase: 50 U/L (ref 38–126)
Anion gap: 7 (ref 5–15)
BUN: 15 mg/dL (ref 8–23)
CO2: 19 mmol/L — ABNORMAL LOW (ref 22–32)
Calcium: 8.5 mg/dL — ABNORMAL LOW (ref 8.9–10.3)
Chloride: 104 mmol/L (ref 98–111)
Creatinine, Ser: 0.59 mg/dL — ABNORMAL LOW (ref 0.61–1.24)
GFR calc Af Amer: >60 mL/min (ref >60)
GFR calc non Af Amer: >60 mL/min (ref >60)
Glucose, Bld: 114 mg/dL — ABNORMAL HIGH (ref 70–99)
Potassium: 3.1 mmol/L — ABNORMAL LOW (ref 3.5–5.1)
Sodium: 130 mmol/L — ABNORMAL LOW (ref 135–145)
Total Bilirubin: 0.5 mg/dL (ref 0.3–1.2)
Total Protein: 5.4 g/dL — ABNORMAL LOW (ref 6.5–8.1)

## 2018-12-29 MED ORDER — POTASSIUM CHLORIDE 20 MEQ PO PACK
40.0000 meq | PACK | Freq: Once | ORAL | Status: AC
  Administered 2018-12-29: 40 meq via ORAL
  Filled 2018-12-29: qty 2

## 2018-12-29 MED ORDER — OXYCODONE HCL 5 MG PO TABS: 2.5000 mg | ORAL_TABLET | Freq: Four times a day (QID) | ORAL | Status: DC | PRN

## 2018-12-29 MED ORDER — MECLIZINE HCL 25 MG PO TABS: 12.5000 mg | ORAL_TABLET | Freq: Two times a day (BID) | ORAL | Status: DC | PRN

## 2018-12-29 NOTE — Progress Notes (Addendum)
Physical Therapy Treatment Patient Details Name: LUI BELLIS MRN: [id number] DOB: 06-10-1956 Today's Date: 12/29/2018    History of Present Illness 63 y.o. male with medical history significant of castration resistant prostate cancer, multiple compression fractures causing chronic pain, history of kyphoplasty of thoracic and lumbar spine, chronic pain, nephrolithiasis, BPH, urothelial carcinoma status post transurethral resection of bladder tumor, hypertension, asthma, and conditions listed below presenting to the hospital for evaluation of confusion.  Pt admitted for acute encephalopathy and hyponatremia.    PT Comments    Pt appears with improve cognition, able to answer all questions appropriately.  Pt stated he has some back pain, pre-medicated and reports it has assisted with reduction to 4/10 LBP.  Pt with mod I bed mobility, verbalized he is aware of log roll techniques though reports he has vertigo symptoms with rolling.  No reports of s/s today through session.  Min guard with transfer training, initial cueing for proper hand placement and pt able to demonstrate appropriate mechanics through session.  Ambulated with RW for 32 ft with min guard/A for safety, no LOB episodes.  THerex exercises complete for LE strengthening, pt requested HEP.  Printout made and given to pt to continue at home included sit to stand with RW, bridges, seated marching and LAQ.  EOS pt requested to return to bed due to fatigue, reports back feels better at EOS.  Call bell within reach and bed alarm set.    Follow Up Recommendations  Home health PT;Supervision/Assistance - 24 hour     Equipment Recommendations  None recommended by PT    Recommendations for Other Services       Precautions / Restrictions Precautions Precautions: Fall;Back Precaution Comments: pt reports he has back brace however does not wear    Mobility  Bed Mobility Overal bed mobility: Modified Independent Bed Mobility: Supine  to Sit     Supine to sit: Min guard     General bed mobility comments: pt recalls log roll technqiues though reports vertigo with rolling.  Able to complete supine to sit mod I, just increased time and cueing for hand rail to assist  Transfers Overall transfer level: Modified independent Equipment used: None;Rolling walker (2 wheeled)             General transfer comment: STS with good handplacement following initial cueing, Paulita Cradle to stand with no AD with supervision for safety.  Ambulation/Gait Ambulation/Gait assistance: Min guard Gait Distance (Feet): 32 Feet Assistive device: Rolling walker (2 wheeled) Gait Pattern/deviations: Decreased stride length;Trunk flexed Gait velocity: slow   General Gait Details: Cueing for posture to assist with LBP   Stairs             Wheelchair Mobility    Modified Rankin (Stroke Patients Only)       Balance                                            Cognition Arousal/Alertness: Awake/alert Behavior During Therapy: WFL for tasks assessed/performed Overall Cognitive Status: Within Functional Limits for tasks assessed                                 General Comments: Pt able to verbalize name, DOB and knowledge of location.  Able to follow all commands without difficulty  Exercises Total Joint Exercises Bridges: 10 reps;Supine General Exercises - Lower Extremity Long Arc Quad: Both;10 reps;Seated Hip ABduction/ADduction: Both;10 reps;Supine Hip Flexion/Marching: Both;10 reps;Seated Toe Raises: Both;10 reps;Seated Heel Raises: Both;10 reps;Seated Other Exercises Other Exercises: 5 STS for strenghtening, able to demonstrate safe mechanics without cueing required    General Comments        Pertinent Vitals/Pain Pain Assessment: 0-10 Pain Score: 4  Pain Location: back with mobility Pain Descriptors / Indicators: Sore Pain Intervention(s): Monitored during session;Premedicated  before session;Limited activity within patient's tolerance;Repositioned    Home Living                      Prior Function            PT Goals (current goals can now be found in the care plan section)      Frequency    Min 3X/week      PT Plan Current plan remains appropriate    Co-evaluation              AM-PAC PT "6 Clicks" Mobility   Outcome Measure  Help needed turning from your back to your side while in a flat bed without using bedrails?: A Little Help needed moving from lying on your back to sitting on the side of a flat bed without using bedrails?: A Little Help needed moving to and from a bed to a chair (including a wheelchair)?: A Little Help needed standing up from a chair using your arms (e.g., wheelchair or bedside chair)?: A Little Help needed to walk in hospital room?: A Little Help needed climbing 3-5 steps with a railing? : A Lot 6 Click Score: 17    End of Session Equipment Utilized During Treatment: Gait belt Activity Tolerance: Patient tolerated treatment well;No increased pain;Patient limited by fatigue Patient left: in bed;with call bell/phone within reach;with bed alarm set(Per fatigue pt requested to return to bed) Nurse Communication: Mobility status PT Visit Diagnosis: Difficulty in walking, not elsewhere classified (R26.2)     Time: 1884-1660 PT Time Calculation (min) (ACUTE ONLY): 35 min  Charges:  $Gait Training: 8-22 mins $Therapeutic Exercise: 8-22 mins                     Ihor Austin, [place]; [place]   Aldona Lento 12/29/2018, 2:35 PM   BRIDGING  While lying on your back with knees bent, tighten your lower abdominals, squeeze your buttocks and then raise your buttocks off the floor/bed as creating a "Bridge" with your body. Hold and then lower yourself and repeat.     TRANSFER - STAND TO SIT WITH WALKER  Walk over to the chair with your walker and turn so that the back of your legs touch  the seat.    Next, lean forward a little as you reach back with your hand to hold the chair and lower your self to sit.    LONG ARC QUAD - LAQ - HIGH SEAT  While seated with your knee in a bent position, slowly straighten your knee as you raise your foot upwards as shown.

## 2018-12-29 NOTE — Patient Instructions (Signed)
  BRIDGING  While lying on your back with knees bent, tighten your lower abdominals, squeeze your buttocks and then raise your buttocks off the floor/bed as creating a "Bridge" with your body. Hold and then lower yourself and repeat.     TRANSFER - STAND TO SIT WITH WALKER  Walk over to the chair with your walker and turn so that the back of your legs touch the seat.    Next, lean forward a little as you reach back with your hand to hold the chair and lower your self to sit.    LONG ARC QUAD - LAQ - HIGH SEAT  While seated with your knee in a bent position, slowly straighten your knee as you raise your foot upwards as shown.

## 2018-12-29 NOTE — Progress Notes (Signed)
[facility] PROGRESS NOTE  [name] Holder [id number]:[id number] DOB: 09-06-1955 DOA: 12/27/2018 PCP: [name], MD   Narrative: 63 year old male Prostate cancer/castration 2014 on Zytiga + prednisone urothelial CA status post transurethral resection tumor-Dr. Alinda Money Compression fracture + kyphoplasty T10, T12, L1, L2-chronic pain HTN Asthma  Most recent admission 2/17-2/27 2020  Was 2/2 toxic metabolic encephalopathy secondary to MS Contin had extensive work-up at that time  Returns to the hospital 6/2 with metabolic encephalopathy-on surgery history from wife-has been having fevers at home has been receiving Tylenol--on 5/25 received Bactrim for his eye from PCP--went to urgent care 6/2 received Cipro and also had foul-smelling diarrhea from this apparently  Labs on admission showed BUN/creatinine 27/1.2 sodium 127 platelet 1 27,000 MRI negative urine analysis pending   A & Plan Toxic metabolic encephalopathy/Delirium unExplained but may be a viral syndrome +/- low sodium and toxic prodrome Wife tells me usually not confused at all Resume slowly meclizine cautious reinitiation oxycodone DC sitter Diarrhea/early sepsis Thrombocytopenia likely related to this C. difficile negative, pathogen panel pending Platelet counts 11/25/2018-dropped to nadir of 70 is now back up arguing for may be viral illness  Fecal lactoferrin needed--if [-] can use immodium ?  UTI discontinue antibiotics Probable SIADH sodium was 127, urine sodium is 39 (low) osmolality 264 (low) confirming the same Sodium improved with 1200 cc fluid restriction, discontinue ARB May need outpatient nephrology input regarding cause suspect medication related and volume depletion AKI Mild metabolic acidosis secondary to AKI Discontinue Benicar completely 40 mg, hold Bactrim Continue saline 75 cc/H-not at baseline-improved Prostate cancer castrate resistant on Zytiga prednisone We will alert his Urologist Dr.  Alinda Money MRI brain is negative so do not think delirium is from this Continue prednisone 5, holding in active infection Zytiga at this time--can resume if no source Low-grade urothelial CA status post resection Outpatient follow-up with Dr. Mills Koller at this time Resume Flomax 0.4 Chronic pain secondary to multiple lumbar surgery See above HTN Only Coreg at this time Asthma BMI 27   DVT Lovenox code Status: Full communication: Long discussion with wife  6/4 disposition Plan: Patient less confused-await lab work up-resolution of hyponatremia and PLT count If all stable, could d/c 24-48 h   Anieya Helman, MD  [facility] Via [employer] app OR [email or web address] 7PM-7AM contact night coverage as above 12/29/2018, 9:34 AM  LOS: 1 day   Consultants:  None  Procedures:  No  Antimicrobials:  None at this time  Interval history/Subjective:  Coherent no distress tolerating diet still has diarrhea no fever no chills No dysuria No vomiting  Objective:  Vitals:  Vitals:   12/29/18 0516 12/29/18 0814  BP: 117/81   Pulse: 92   Resp: 20   Temp: 97.9 F (36.6 C)   SpO2: 97% 96%    Exam:  Coherent alert oriented much more with it today EOMI NCAT No submandibular lymphadenopathy, Mallampati 2 S1-S2 no murmur rub or gallop Abdomen soft no rebound no guarding no abdominal discomfort slightly distended No lower extremity edema Neurologically intact moving all 4 limbs equally Coherent   I have personally reviewed the following:  DATA   Labs:  Sodium 127-->130, potassium 3.7-->3.1  BUN/creatinine 22/0.9-->15/0.5 (baseline prior to this is 18/0.6)  WBC 4.0  Platelets up from 78-87   Imaging studies:  n  Medical tests:  n   Test discussed with performing physician:  n  Decision to obtain old records:  Yes  Review and summation of old records:  Yes  Scheduled  Meds: . abiraterone acetate  1,000 mg Oral Daily  . B-complex with vitamin C  1 tablet  Oral Daily  . carvedilol  3.125 mg Oral BID WC  . fluticasone  2 spray Each Nare Daily  . mometasone-formoterol  2 puff Inhalation BID  . potassium chloride  40 mEq Oral Once  . predniSONE  5 mg Oral BID WC  . sodium chloride flush  3 mL Intravenous Once   Continuous Infusions: . sodium chloride 75 mL/hr at 12/29/18 0800    Principal Problem:   Acute encephalopathy Active Problems:   Hyponatremia   AKI (acute kidney injury) ([place])   Diarrhea   Anemia   Sepsis ([place])   LOS: 1 day

## 2018-12-30 LAB — CBC WITH DIFFERENTIAL/PLATELET
Abs Immature Granulocytes: 0.02 10*3/uL (ref 0.00–0.07)
Basophils Absolute: 0 10*3/uL (ref 0.0–0.1)
Basophils Relative: 1 %
Eosinophils Absolute: 0.1 10*3/uL (ref 0.0–0.5)
Eosinophils Relative: 2 %
HCT: 30.7 % — ABNORMAL LOW (ref 39.0–52.0)
Hemoglobin: 10.4 g/dL — ABNORMAL LOW (ref 13.0–17.0)
Immature Granulocytes: 1 %
Lymphocytes Relative: 35 %
Lymphs Abs: 1.5 10*3/uL (ref 0.7–4.0)
MCH: 29.7 pg (ref 26.0–34.0)
MCHC: 33.9 g/dL (ref 30.0–36.0)
MCV: 87.7 fL (ref 80.0–100.0)
Monocytes Absolute: 0.4 10*3/uL (ref 0.1–1.0)
Monocytes Relative: 9 %
Neutro Abs: 2.3 10*3/uL (ref 1.7–7.7)
Neutrophils Relative %: 52 %
Platelets: 97 10*3/uL — ABNORMAL LOW (ref 150–400)
RBC: 3.5 MIL/uL — ABNORMAL LOW (ref 4.22–5.81)
RDW: 13.4 % (ref 11.5–15.5)
WBC: 4.4 10*3/uL (ref 4.0–10.5)
nRBC: 0 % (ref 0.0–0.2)

## 2018-12-30 LAB — RENAL FUNCTION PANEL
Albumin: 2.7 g/dL — ABNORMAL LOW (ref 3.5–5.0)
Anion gap: 5 (ref 5–15)
BUN: 14 mg/dL (ref 8–23)
CO2: 19 mmol/L — ABNORMAL LOW (ref 22–32)
Calcium: 8.4 mg/dL — ABNORMAL LOW (ref 8.9–10.3)
Chloride: 110 mmol/L (ref 98–111)
Creatinine, Ser: 0.48 mg/dL — ABNORMAL LOW (ref 0.61–1.24)
GFR calc Af Amer: >60 mL/min (ref >60)
GFR calc non Af Amer: >60 mL/min (ref >60)
Glucose, Bld: 105 mg/dL — ABNORMAL HIGH (ref 70–99)
Phosphorus: 2 mg/dL — ABNORMAL LOW (ref 2.5–4.6)
Potassium: 3.3 mmol/L — ABNORMAL LOW (ref 3.5–5.1)
Sodium: 134 mmol/L — ABNORMAL LOW (ref 135–145)

## 2018-12-30 LAB — LACTOFERRIN, FECAL, QUALITATIVE: Lactoferrin, Fecal, Qual: NEGATIVE

## 2018-12-30 MED ORDER — LOPERAMIDE HCL 2 MG PO CAPS: 4.0000 mg | ORAL_CAPSULE | Freq: Four times a day (QID) | ORAL | 0 refills | Status: DC | PRN

## 2018-12-30 NOTE — Care Management Important Message (Signed)
Important Message  Patient Details IM Letter given to [name] SW to present to the Patient Name: [name] Holder MRN: [id number] Date of Birth: 03-Jan-1956   Medicare Important Message Given:  Yes    Kerin Salen 12/30/2018, 10:03 AM

## 2018-12-30 NOTE — Discharge Summary (Signed)
Physician Discharge Summary  [name] Holder FTD:[id number] DOB: Mar 26, 1956 DOA: 12/27/2018  PCP: [name], MD  Admit date: 12/27/2018 Discharge date: 12/30/2018  Time spent: 40 minutes  Recommendations for Outpatient Follow-up:  1. Will require Chem-12 in 1 week in addition to potentially work-up with TSH and. cortisol if patient's hyponatremia recurs-discontinued ARB this admission 2. Please repeat CBC with complete differential and if his counts are still low he will need referral to hematology/Dr. Alinda Money as I think his Fabio Asa can cause this 3. Would recommend outpatient referral to nephrology although I think this is plain old SIADH 4. I have encouraged him to slow down on some of his chronic pain meds as he gets sometimes confused on this- 5. I will call in a prescription for Imodium as I think he had some viral illness that was superimposed on his other issues 6. He will need close follow-up and hopefully de-escalation off of his chronic opiates 7. Would perform Mini-Mental's testing/Moca on follow-up at PCP office to determine if there is any element of dementia  Discharge Diagnoses:  Principal Problem:   Acute encephalopathy Active Problems:   Hyponatremia   AKI (acute kidney injury) ([facility])   Diarrhea   Anemia   Sepsis ([place])   Discharge Condition: Improved  Diet recommendation: Regular do not restrict salt  Filed Weights   12/27/18 1431  Weight: 78.9 kg    History of present illness:  63 year old male Prostate cancer/castration 2014 on Zytiga + prednisone urothelial CA status post transurethral resection tumor-Dr. Alinda Money Compression fracture + kyphoplasty T10, T12, L1, L2-chronic pain HTN Asthma  Most recent admission 2/17-2/27 2020  Was 2/2 toxic metabolic encephalopathy secondary to MS Contin had extensive work-up at that time  Returns to the hospital 6/2 with metabolic encephalopathy-on surgery history from wife-has been having fevers at home has been  receiving Tylenol--on 5/25 received Bactrim for his eye from PCP--went to urgent care 6/2 received Cipro and also had foul-smelling diarrhea from this apparently  Labs on admission showed BUN/creatinine 27/1.2 sodium 127 platelet 1 27,000   MRI negative no growth on urine C. difficile negative GI pathogen panel negative fecal lactoferrin negative  Hospital Course:  Toxic metabolic encephalopathy/Delirium unExplained but may be a viral syndrome +/- low sodium and toxic prodrome Wife tells me usually not confused at all Resume slowly meclizine cautious reinitiation oxycodone Had a safety sitter but he seemed to come back to his normal self Diarrhea/early sepsis Thrombocytopenia likely related to this C. difficile negative, pathogen panel was negative and fecal lactoferrin was negative Platelet counts 11/25/2018-dropped to nadir of 70 is now back up arguing for may be viral illness  Fecal lactoferrin needed--if [-] can use immodium ?  UTI discontinue antibiotics Probable SIADH sodium was 127, urine sodium is 39 (low) osmolality 264 (low) confirming the same Sodium improved with 1200 cc fluid restriction, discontinue ARB May need outpatient nephrology input regarding cause suspect medication related and volume depletion Sodium came up from 125-134 on d/c AKI Mild metabolic acidosis secondary to AKI Discontinue Benicar completely 40 mg, hold Bactrim Improved Prostate cancer castrate resistant on Zytiga prednisone We will alert his Urologist Dr. Alinda Money MRI brain is negative so do not think delirium is from this Continue prednisone 5, holding in active infection Zytiga at this time--can resume if no source Low-grade urothelial CA status post resection Outpatient follow-up with Dr. Mills Koller at this time Resume Flomax 0.4 Chronic pain secondary to multiple lumbar surgery See above HTN Only Coreg at this  time Asthma BMI 27  Procedures: none Consultations:  none  Discharge  Exam: Vitals:   12/30/18 0621 12/30/18 0816  BP: 131/87   Pulse: 81   Resp: 18   Temp: 98 F (36.7 C)   SpO2: 99% 98%    General: awake pleasant alert no distress eomi ncat no ict no pallor Cardiovascular: s1 s 2no m/r/g Respiratory: clear no added sound abd soft nt nd no rebound no gaurd  Discharge Instructions   Discharge Instructions    Diet - low sodium heart healthy   Complete by:  As directed    Discharge instructions   Complete by:  As directed    Follow-up with your regular physician and get labs in 1 to 2 weeks It was felt that your sodium was low mainly because of excess fluids over and above how much you are eating-the main treatment for this is as below  eat more than you drink and do not drink excessive amounts of fluid try to maintain at least 1500 cc upper limit of how much she drinks during the winter and 1800 cc fluid limit in the summer--- I would also not use olmesartan for the time being as this can exacerbate that You probably had an unexplained viral illness causing some of the diarrhea-I did not find any source of this and you will need to continue your Imodium and follow-up with your regular physicians and oncology providers   Increase activity slowly   Complete by:  As directed      Allergies as of 12/30/2018      Reactions   Amlodipine Swelling   edema   Tetracycline Rash      Medication List    STOP taking these medications   ciprofloxacin 500 MG tablet Commonly known as:  CIPRO   olmesartan 40 MG tablet Commonly known as:  BENICAR   sulfamethoxazole-trimethoprim 800-160 MG tablet Commonly known as:  BACTRIM DS     TAKE these medications   abiraterone acetate 250 MG tablet Commonly known as:  ZYTIGA Take 1,000 mg by mouth daily.   acetaminophen 325 MG tablet Commonly known as:  TYLENOL Take 325 mg by mouth daily as needed for fever. What changed:  Another medication with the same name was removed. Continue taking this medication,  and follow the directions you see here.   albuterol (2.5 MG/3ML) 0.083% nebulizer solution Commonly known as:  PROVENTIL Take 3 mLs (2.5 mg total) by nebulization every 6 (six) hours as needed for wheezing or shortness of breath.   albuterol 108 (90 Base) MCG/ACT inhaler Commonly known as:  ProAir HFA 2 PUFFS EVERY 6 HOURS AS NEEDED FOR WHEEZING   azelastine 0.1 % nasal spray Commonly known as:  ASTELIN Place 2 sprays into both nostrils every morning. What changed:    when to take this  reasons to take this   b complex vitamins tablet Take 1 tablet by mouth daily.   budesonide-formoterol 160-4.5 MCG/ACT inhaler Commonly known as:  Symbicort Inhale 2 puffs into the lungs 2 (two) times daily.   CALCIUM-VITAMIN D PO Take 1 tablet by mouth 2 (two) times daily.   carvedilol 3.125 MG tablet Commonly known as:  COREG Take 1 tablet (3.125 mg total) by mouth 2 (two) times daily with a meal.   CENTRUM SILVER 50+MEN PO Take 1 tablet by mouth daily.   cyanocobalamin 1000 MCG/ML injection Commonly known as:  (VITAMIN B-12) Inject 1,000 mcg into the muscle every 30 (thirty) days.  diclofenac sodium 1 % Gel Commonly known as:  VOLTAREN Apply 4 g topically 4 (four) times daily.   ipratropium-albuterol 0.5-2.5 (3) MG/3ML Soln Commonly known as:  DUONEB Take 3 mLs by nebulization every 4 (four) hours as needed.   loperamide 2 MG capsule Commonly known as:  IMODIUM Take 4 mg by mouth as needed for diarrhea or loose stools.   LUPRON IJ Inject 1 Dose as directed every 4 (four) months. This is administered by [facility]   meclizine 12.5 MG tablet Commonly known as:  ANTIVERT Take 1 tablet (12.5 mg total) by mouth 3 (three) times daily as needed for dizziness.   mometasone 50 MCG/ACT nasal spray Commonly known as:  NASONEX Place 2 sprays into the nose daily.   Narcan 4 MG/0.1ML Liqd nasal spray kit Generic drug:  naloxone Place 0.4 mg into the nose once.   oxyCODONE  5 MG immediate release tablet Commonly known as:  Oxy IR/ROXICODONE Take 1 tablet (5 mg total) by mouth every 4 (four) hours as needed for severe pain. What changed:  how much to take   predniSONE 5 MG tablet Commonly known as:  DELTASONE Take 1 tablet (5 mg total) by mouth daily with breakfast. What changed:  when to take this   SYRINGE-NEEDLE (DISP) 3 ML 3 ML Misc Use to inject b12 monthly.   tamsulosin 0.4 MG Caps capsule Commonly known as:  FLOMAX Take 1 capsule (0.4 mg total) by mouth daily.      Allergies  Allergen Reactions  . Amlodipine Swelling    edema  . Tetracycline Rash      The results of significant diagnostics from this hospitalization (including imaging, microbiology, ancillary and laboratory) are listed below for reference.    Significant Diagnostic Studies: Dg Lumbar Spine 2-3 Views  Result Date: 12/27/2018 CLINICAL DATA:  Acute on chronic low back pain. History of prior compression fractures status post kyphoplasty. EXAM: LUMBAR SPINE - 2-3 VIEW COMPARISON:  CT abdomen pelvis dated September 13, 2018. FINDINGS: Five lumbar type vertebral bodies. No acute fracture or subluxation. Unchanged chronic mild L3 central superior endplate compression fracture. Unchanged chronic T10, T12, L1, and L2 compression deformities status post cement augmentation. Unchanged trace retrolisthesis at L3-L4 and trace anterolisthesis at L5-S1. Unchanged bilateral L5 pars defects. Unchanged moderate disc height loss at L3-L4 and severe disc height loss at L4-L5. The sacroiliac joints are unremarkable. Aortoiliac atherosclerotic vascular disease. IMPRESSION: 1. No acute osseous abnormality. Multiple chronic thoracolumbar compression deformities are unchanged. 2. Unchanged moderate L3-L4 and severe L4-L5 degenerative disc disease. Electronically Signed   By: Titus Dubin M.D.   On: 12/27/2018 17:00   Mr Jeri Cos UJ Contrast  Result Date: 12/27/2018 CLINICAL DATA:  History of prostate  cancer and new confusion, uncontrollable jerking. EXAM: MRI HEAD WITHOUT AND WITH CONTRAST TECHNIQUE: Multiplanar, multiecho pulse sequences of the brain and surrounding structures were obtained without and with intravenous contrast. CONTRAST:  Gadavist 8 mL COMPARISON:  CT head 09/13/2018. MR head 09/19/2018. MR brain 04/11/2018. CT head 04/11/2018. FINDINGS: There is significant motion degradation. The study is of borderline to marginal diagnostic utility. Brain: No restricted diffusion to suggest acute infarct. No hemorrhage, mass lesion, hydrocephalus, or extra-axial fluid. Advanced for age cerebral and cerebellar atrophy. Mild subcortical and periventricular T2 and FLAIR hyperintensities, likely chronic microvascular ischemic change. There is minimal symmetric pachymeningeal enhancement, doubtful significance. No intra-axial lesions are seen. Vascular: Flow voids are maintained. Skull and upper cervical spine: There is asymmetric thickness of the  LEFT parietal calvarium is compared to the RIGHT. This appearance is stable from 2019, and metastatic disease is not established. Sinuses/Orbits: No significant sinus or orbital disease. Other: No visible mastoid fluid. IMPRESSION: Motion degraded exam demonstrating atrophy, and small vessel disease, but no definite acute intracranial findings. No visible metastatic disease to the brain or extradural space. Slight asymmetric thickening of the LEFT parietal calvarium appears stable from prior imaging studies, and is not clearly pathologic, despite patient's history of prostate cancer. Electronically Signed   By: Staci Righter M.D.   On: 12/27/2018 18:59    Microbiology: Recent Results (from the past 240 hour(s))  SARS Coronavirus 2 (CEPHEID - Performed in [facility] [place] hospital lab), Hosp Order     Status: None   Collection Time: 12/27/18  8:34 PM  Result Value Ref Range Status   SARS Coronavirus 2 NEGATIVE NEGATIVE Final    Comment: (NOTE) If result is  NEGATIVE SARS-CoV-2 target nucleic acids are NOT DETECTED. The SARS-CoV-2 RNA is generally detectable in upper and lower  respiratory specimens during the acute phase of infection. The lowest  concentration of SARS-CoV-2 viral copies this assay can detect is 250  copies / mL. A negative result does not preclude SARS-CoV-2 infection  and should not be used as the sole basis for treatment or other  patient management decisions.  A negative result may occur with  improper specimen collection / handling, submission of specimen other  than nasopharyngeal swab, presence of viral mutation(s) within the  areas targeted by this assay, and inadequate number of viral copies  (<250 copies / mL). A negative result must be combined with clinical  observations, patient history, and epidemiological information. If result is POSITIVE SARS-CoV-2 target nucleic acids are DETECTED. The SARS-CoV-2 RNA is generally detectable in upper and lower  respiratory specimens dur ing the acute phase of infection.  Positive  results are indicative of active infection with SARS-CoV-2.  Clinical  correlation with patient history and other diagnostic information is  necessary to determine patient infection status.  Positive results do  not rule out bacterial infection or co-infection with other viruses. If result is PRESUMPTIVE POSTIVE SARS-CoV-2 nucleic acids MAY BE PRESENT.   A presumptive positive result was obtained on the submitted specimen  and confirmed on repeat testing.  While 2019 novel coronavirus  (SARS-CoV-2) nucleic acids may be present in the submitted sample  additional confirmatory testing may be necessary for epidemiological  and / or clinical management purposes  to differentiate between  SARS-CoV-2 and other Sarbecovirus currently known to infect humans.  If clinically indicated additional testing with an alternate test  methodology [phone]) is advised. The SARS-CoV-2 RNA is generally  detectable  in upper and lower respiratory sp ecimens during the acute  phase of infection. The expected result is Negative. Fact Sheet for Patients:  StrictlyIdeas.no Fact Sheet for Healthcare Providers: BankingDealers.co.za This test is not yet approved or cleared by the [place] FDA and has been authorized for detection and/or diagnosis of SARS-CoV-2 by FDA under an Emergency Use Authorization (EUA).  This EUA will remain in effect (meaning this test can be used) for the duration of the COVID-19 declaration under Section 564(b)(1) of the Act, 21 U.S.C. section 360bbb-3(b)(1), unless the authorization is terminated or revoked sooner. Performed at [facility], [place] [address] [place], [place] [address]   Culture, Urine     Status: None   Collection Time: 12/27/18 11:47 PM  Result Value Ref Range Status  Specimen Description   Final    URINE, RANDOM Performed at [facility], [place] [address] [place], [place] [address]    Special Requests   Final    NONE Performed at [facility], [place] [address], [place], [place] [address]    Culture   Final    NO GROWTH Performed at [facility], [place] [address], [place], [place] [address]    Report Status 12/28/2018 FINAL  Final  C difficile quick scan w PCR reflex     Status: None   Collection Time: 12/28/18  4:32 AM  Result Value Ref Range Status   C Diff antigen NEGATIVE NEGATIVE Final   C Diff toxin NEGATIVE NEGATIVE Final   C Diff interpretation No C. difficile detected.  Final    Comment: Performed at [facility], [place] [address], [place], [place] [address]  Gastrointestinal Panel by PCR , Stool     Status: None   Collection Time: 12/28/18  3:36 PM  Result Value Ref Range Status   Campylobacter species NOT DETECTED NOT DETECTED Final   Plesimonas shigelloides NOT DETECTED NOT DETECTED Final   Salmonella species  NOT DETECTED NOT DETECTED Final   Yersinia enterocolitica NOT DETECTED NOT DETECTED Final   Vibrio species NOT DETECTED NOT DETECTED Final   Vibrio cholerae NOT DETECTED NOT DETECTED Final   Enteroaggregative E coli (EAEC) NOT DETECTED NOT DETECTED Final   Enteropathogenic E coli (EPEC) NOT DETECTED NOT DETECTED Final   Enterotoxigenic E coli (ETEC) NOT DETECTED NOT DETECTED Final   Shiga like toxin producing E coli (STEC) NOT DETECTED NOT DETECTED Final   Shigella/Enteroinvasive E coli (EIEC) NOT DETECTED NOT DETECTED Final   Cryptosporidium NOT DETECTED NOT DETECTED Final   Cyclospora cayetanensis NOT DETECTED NOT DETECTED Final   Entamoeba histolytica NOT DETECTED NOT DETECTED Final   Giardia lamblia NOT DETECTED NOT DETECTED Final   Adenovirus F40/41 NOT DETECTED NOT DETECTED Final   Astrovirus NOT DETECTED NOT DETECTED Final   Norovirus GI/GII NOT DETECTED NOT DETECTED Final   Rotavirus A NOT DETECTED NOT DETECTED Final   Sapovirus (I, II, IV, and V) NOT DETECTED NOT DETECTED Final    Comment: Performed at [facility], [place] [place], [place] [address]     Labs: Basic Metabolic Panel: Recent Labs  Lab 12/27/18 2251 12/28/18 0114 12/28/18 0823 12/29/18 0836 12/30/18 0759  NA 125* 125* 127* 130* 134*  K 3.9 3.6 3.7 3.1* 3.3*  CL 98 100 101 104 110  CO2 17* 18* 18* 19* 19*  GLUCOSE 106* 103* 84 114* 105*  BUN 21 24* _0 CREATININE 0.90 0.88 0.97 0.59* 0.48*  CALCIUM 8.7* 8.5* 8.7* 8.5* 8.4*  PHOS  --   --   --   --  2.0*   Liver Function Tests: Recent Labs  Lab 12/28/18 0114 12/29/18 0836 12/30/18 0759  AST 33 23  --   ALT 33 33  --   ALKPHOS 52 50  --   BILITOT 0.6 0.5  --   PROT 5.6* 5.4*  --   ALBUMIN 3.4* 3.1* 2.7*   No results for input(s): LIPASE, AMYLASE in the last 168 hours. Recent Labs  Lab 12/27/18 2251  AMMONIA <9*   CBC: Recent Labs  Lab 12/27/18 1435 12/28/18 0823 12/29/18 0836 12/30/18 0759  WBC 7.6 4.5 4.0  4.4  NEUTROABS  --   --  3.0 2.3  HGB 13.1 12.5* 11.1*  10.4*  HCT 39.2 36.8* 33.1* 30.7*  MCV 88.9 89.1 88.5 87.7  PLT 127* 78* 87* 97*   Cardiac Enzymes: No results for input(s): CKTOTAL, CKMB, CKMBINDEX, TROPONINI in the last 168 hours. BNP: BNP (last 3 results) No results for input(s): BNP in the last 8760 hours.  ProBNP (last 3 results) No results for input(s): PROBNP in the last 8760 hours.  CBG: Recent Labs  Lab 12/27/18 1553  GLUCAP 107*       Signed:  Nita Sells MD   [facility] 12/30/2018, 9:55 AM

## 2018-12-30 NOTE — Progress Notes (Signed)
Vestibular assessment  Pt c/o spinning when rolling:  Sounds like BPPV.  Tested all canals, modified hallpike 2*back pain.  All canals negative and no spinning/symptoms noted throughout assessment.  Gave pt an OP vestibular referral form, if symptoms reappear.  L eye did appear to be malaligned, as noted below, but no diplopia, and eyes did not shift with cover/uncover test.  Pt reports that wife assists with all adls at baseline.  Reviewed no bending/twisting to decrease back stress   12/30/18 0001  Vestibular Assessment  General Observation L eye appears to be positioned more laterally than R. When lying down, also more superior. No c/o lazy eye  Symptom Behavior  Subjective history of current problem new problem has mostly been happening in supine, with rolling to bil sides. Last time it happened was last night  Type of Dizziness  Spinning;"World moves"  Frequency of Dizziness intermittent  History of similar episodes none  Oculomotor Exam  Oculomotor Alignment Abnormal  Ocular ROM wfls  Spontaneous Absent  Gaze-induced  Absent  Head shaking Horizontal Absent  Smooth Pursuits Intact  Oculomotor Exam-Fixation Suppressed   Left Head Impulse negative  Right Head Impulse negative  Positional Testing  Sidelying Test Sidelying Right;Sidelying Left (wfls)  Horizontal Canal Testing  (wfls)  no c/o pain during assessment Lesle Chris, OTR/L Acute Rehabilitation Services [phone] [facility] pager [phone] office 12/30/2018

## 2019-01-02 ENCOUNTER — Ambulatory Visit (INDEPENDENT_AMBULATORY_CARE_PROVIDER_SITE_OTHER): Payer: Medicare Other | Admitting: Family

## 2019-01-02 ENCOUNTER — Encounter: Payer: Self-pay | Admitting: Family

## 2019-01-02 DIAGNOSIS — I10 Essential (primary) hypertension: Secondary | ICD-10-CM | POA: Diagnosis not present

## 2019-01-02 DIAGNOSIS — N179 Acute kidney failure, unspecified: Secondary | ICD-10-CM | POA: Diagnosis not present

## 2019-01-02 DIAGNOSIS — Z8546 Personal history of malignant neoplasm of prostate: Secondary | ICD-10-CM

## 2019-01-02 DIAGNOSIS — E871 Hypo-osmolality and hyponatremia: Secondary | ICD-10-CM

## 2019-01-02 DIAGNOSIS — E222 Syndrome of inappropriate secretion of antidiuretic hormone: Secondary | ICD-10-CM

## 2019-01-02 DIAGNOSIS — C679 Malignant neoplasm of bladder, unspecified: Secondary | ICD-10-CM

## 2019-01-02 MED ORDER — OLMESARTAN MEDOXOMIL 20 MG PO TABS: 20.0000 mg | ORAL_TABLET | Freq: Two times a day (BID) | ORAL | 1 refills | Status: DC

## 2019-01-02 NOTE — Progress Notes (Signed)
[name] Holder is a 63 y.o. male with the following history as recorded in EpicCare:  Patient Active Problem List   Diagnosis Date Noted  . Hyponatremia 12/28/2018  . AKI (acute kidney injury) ([place]) 12/28/2018  . Diarrhea 12/28/2018  . Anemia 12/28/2018  . Sepsis ([place]) 12/28/2018  . Acute encephalopathy 12/27/2018  . Hordeolum externum of left lower eyelid 12/20/2018  . Orthostatic hypotension 11/28/2018  . Hyperglycemia 11/24/2018  . Pathologic compression fracture of spine, initial encounter ([place]) 09/13/2018  . Cancer associated pain 09/13/2018  . Bladder cancer ([place]) 09/13/2018  . Secondary malignant neoplasm of bone and bone marrow ([name]) 08/06/2018  . Spinal stenosis in cervical region 04/12/2018  . Routine general medical examination at a health care facility 08/29/2015  . Hyperlipidemia with target LDL less than 100 12/21/2013  . Upper airway cough syndrome 07/25/2013  . History of prostate cancer   . Essential hypertension 05/20/2010  . Allergic rhinitis 05/20/2010  . Asthma, mild intermittent 05/20/2010    Current Outpatient Medications  Medication Sig Dispense Refill  . abiraterone acetate (ZYTIGA) 250 MG tablet Take 1,000 mg by mouth daily.   10  . acetaminophen (TYLENOL) 325 MG tablet Take 325 mg by mouth daily as needed for fever.    [name] albuterol (PROAIR HFA) 108 (90 Base) MCG/ACT inhaler 2 PUFFS EVERY 6 HOURS AS NEEDED FOR WHEEZING 8.5 g 5  . albuterol (PROVENTIL) (2.5 MG/3ML) 0.083% nebulizer solution Take 3 mLs (2.5 mg total) by nebulization every 6 (six) hours as needed for wheezing or shortness of breath. 150 mL 1  . azelastine (ASTELIN) 0.1 % nasal spray Place 2 sprays into both nostrils every morning. (Patient taking differently: Place 2 sprays into both nostrils daily as needed for allergies. )    . b complex vitamins tablet Take 1 tablet by mouth daily.    . budesonide-formoterol (SYMBICORT) 160-4.5 MCG/ACT inhaler Inhale 2 puffs into the lungs 2 (two) times  daily. 30.6 g 1  . CALCIUM-VITAMIN D PO Take 1 tablet by mouth 2 (two) times daily.     . carvedilol (COREG) 3.125 MG tablet Take 1 tablet (3.125 mg total) by mouth 2 (two) times daily with a meal. 180 tablet 1  . cyanocobalamin (,VITAMIN B-12,) 1000 MCG/ML injection Inject 1,000 mcg into the muscle every 30 (thirty) days.   4  . diclofenac sodium (VOLTAREN) 1 % GEL Apply 4 g topically 4 (four) times daily. (Patient not taking: Reported on 12/27/2018)    . ipratropium-albuterol (DUONEB) 0.5-2.5 (3) MG/3ML SOLN Take 3 mLs by nebulization every 4 (four) hours as needed. 360 mL 1  . Leuprolide Acetate (LUPRON IJ) Inject 1 Dose as directed every 4 (four) months. This is administered by [facility]    . loperamide (IMODIUM) 2 MG capsule Take 2 capsules (4 mg total) by mouth every 6 (six) hours as needed for diarrhea or loose stools. 60 capsule 0  . meclizine (ANTIVERT) 12.5 MG tablet Take 1 tablet (12.5 mg total) by mouth 3 (three) times daily as needed for dizziness. 270 tablet 1  . mometasone (NASONEX) 50 MCG/ACT nasal spray Place 2 sprays into the nose daily. 51 g 1  . Multiple Vitamins-Minerals (CENTRUM SILVER 50+MEN PO) Take 1 tablet by mouth daily.    [name] NARCAN 4 MG/0.1ML LIQD nasal spray kit Place 0.4 mg into the nose once.     [name] olmesartan (BENICAR) 20 MG tablet Take 1 tablet (20 mg total) by mouth 2 (two) times daily. 60 tablet 1  .  oxyCODONE (OXY IR/ROXICODONE) 5 MG immediate release tablet Take 1 tablet (5 mg total) by mouth every 4 (four) hours as needed for severe pain. (Patient taking differently: Take 2.5 mg by mouth every 4 (four) hours as needed for severe pain. ) 180 tablet 0  . predniSONE (DELTASONE) 5 MG tablet Take 1 tablet (5 mg total) by mouth daily with breakfast. (Patient taking differently: Take 5 mg by mouth 2 (two) times daily with a meal. )    . SYRINGE-NEEDLE, DISP, 3 ML 3 ML MISC Use to inject b12 monthly. 12 each 0  . tamsulosin (FLOMAX) 0.4 MG CAPS capsule Take 1 capsule  (0.4 mg total) by mouth daily. (Patient not taking: Reported on 12/27/2018) 30 capsule    No current facility-administered medications for this visit.     Allergies: Amlodipine and Tetracycline  Past Medical History:  Diagnosis Date  . Allergic rhinitis   . At risk for sleep apnea    STOP-BANG SCORE = 5    (routed to pt's pcp 09-08-2018)  . Chronic constipation   . Chronic low back pain   . Chronic pain   . DOE (dyspnea on exertion)   . History of kidney stones   . History of recent pneumonia 08/10/2018   acute bronchopneumonia;  follow-up cxr 08-28-2018 in epic  . History of vertebral compression fracture    T10 and T12 s/p kyphoplasty  . Hx of colonic polyps   . Hyperplasia of prostate with lower urinary tract symptoms (LUTS)   . Hypertension   . Mild persistent asthma    followed by pcp  . Numbness in both legs    secondary to a fall, per pt has had work-up and couldn't find anything  . Osteopenia   . Prostate cancer metastatic to pelvis [facility]) urologist-- dr borden/  oncologist -- dr Alen Blew   dx 2014 advanced prostate cancer with pelvid adenopathy,  Stage T2c, Gleason 7;  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  . Wears glasses     Past Surgical History:  Procedure Laterality Date  . COLONOSCOPY    . IR KYPHO EA ADDL LEVEL THORACIC OR LUMBAR  08/17/2018  . IR KYPHO EA ADDL LEVEL THORACIC OR LUMBAR  08/26/2018  . IR KYPHO LUMBAR INC FX REDUCE BONE BX UNI/BIL CANNULATION INC/IMAGING  08/26/2018  . IR KYPHO THORACIC WITH BONE BIOPSY  08/17/2018  . IR RADIOLOGIST EVAL & MGMT  08/11/2018  . IR RADIOLOGIST EVAL & MGMT  08/24/2018  . LYMPHADENECTOMY Left 01/05/2013   Procedure: ROBOTIC LYMPHADENECTOMY;  Surgeon: Dutch Gray, MD;  Location: [facility];  Service: Urology;  Laterality: Left;  . PROSTATE BIOPSY     11/12 positive biopsies  . ROTATOR CUFF REPAIR Left 2012  . TRANSURETHRAL RESECTION OF BLADDER TUMOR N/A 09/15/2018   Procedure: TRANSURETHRAL RESECTION OF BLADDER TUMOR (TURBT)WITH CYSTOSCOPY/  POST OPERATIVE INSTILLATION OF GEMCITABINE;  Surgeon: Raynelle Bring, MD;  Location: [facility];  Service: Urology;  Laterality: N/A;  GENERAL ANESTHESIA WITH PARALYSIS  . UMBILICAL HERNIA REPAIR  09-02-2001   dr t. price _0     Family History  Problem Relation Age of Onset  . Arthritis Mother   . Diabetes Mother   . Hypertension Mother   . Hyperlipidemia Mother   . Hypertension Father   . Prostate cancer Father        radiation in mid 22s  . Heart attack Father   . Cancer Sister        precancerous colon polyps, 6 in colon removed  .  Stroke Maternal Grandfather     Social History   Tobacco Use  . Smoking status: Former Smoker    Packs/day: 0.50    Years: 25.00    Pack years: 12.50    Types: Cigarettes    Last attempt to quit: 09/08/2012    Years since quitting: 6.3  . Smokeless tobacco: Never Used  Substance Use Topics  . Alcohol use: Not Currently    Alcohol/week: 2.0 standard drinks    Types: 2 Glasses of wine per week    Subjective:    I connected with Glendon Axe on 01/02/19 at  3:00 PM EDT by a video enabled telemedicine application and verified that I am speaking with the correct person using two identifiers. Patient, wife and I are present on the video call.   I discussed the limitations of evaluation and management by telemedicine and the availability of in person appointments. The patient expressed understanding and agreed to proceed.  Patient was hospitalized last week with hyponatremia, AKI due to dehydration and confusion. During course of hospitalization, Olmesartan was held due to AKI. Upon discharge on Friday, patient was only taking Coreg. Patient's wife became concerned on Saturday morning when patient's blood pressure was elevated at 160/90 and he was complaining of a headache. She opted to re-start the Olmesartan. However, both patient and wife are concerned that patient's blood pressure is not being well controlled for 24 hours. He will wake up with  elevated blood pressure- take the Olmesartan and by mid-day his pressure improves; ( Today pressure was 170/92 upon awakening and normalized to 134/82 by mid-day); Is scheduled to see his oncologist in follow-up next week and will have updated labs; Is trying to drink [employer] regularly to help with sodium;    Objective:  There were no vitals filed for this visit.  General: Well developed, patient is lying down during course of video call/  Ill-appearing;   Head: Normocephalic and atraumatic  Lungs: Respirations unlabored;  Neurologic: Alert and oriented; speech intact; face symmetrical;   Assessment:  1. Essential hypertension   2. AKI (acute kidney injury) ([place])   3. Hyponatremia   4. SIADH (syndrome of inappropriate ADH production) ([place])   5. Malignant neoplasm of urinary bladder, unspecified site ([place])   6. History of prostate cancer     Plan:  1. Continue Coreg 3.125 mg bid; try Benicar 20 mg bid; follow-up in 1 week with oncology; 2. Appeared resolved at time of discharge- patient is going to get his labs re-checked in 1 week with oncology; if creatinine is elevated at that time, will need to d/c Olmesartan. 3. & 4. Discussed possible nephrology evaluation; patient and wife defer scheduling at this time- will re-evaluate after upcoming oncology appointment next week. 5. & 6. Keep planned oncology consult; also follow-up with urology as scheduled as well.   No follow-ups on file.  No orders of the defined types were placed in this encounter.   Requested Prescriptions   Signed Prescriptions Disp Refills  . olmesartan (BENICAR) 20 MG tablet 60 tablet 1    Sig: Take 1 tablet (20 mg total) by mouth 2 (two) times daily.

## 2019-01-10 ENCOUNTER — Inpatient Hospital Stay (HOSPITAL_BASED_OUTPATIENT_CLINIC_OR_DEPARTMENT_OTHER): Payer: Medicare Other | Admitting: Oncology

## 2019-01-10 ENCOUNTER — Inpatient Hospital Stay: Payer: Medicare Other | Attending: Oncology

## 2019-01-10 ENCOUNTER — Telehealth: Payer: Self-pay | Admitting: Oncology

## 2019-01-10 ENCOUNTER — Other Ambulatory Visit: Payer: Self-pay

## 2019-01-10 VITALS — BP 138/80 | HR 76 | Temp 98.7°F | Resp 18 | Ht 67.0 in | Wt 167.5 lb

## 2019-01-10 DIAGNOSIS — M4854XA Collapsed vertebra, not elsewhere classified, thoracic region, initial encounter for fracture: Secondary | ICD-10-CM | POA: Insufficient documentation

## 2019-01-10 DIAGNOSIS — K5900 Constipation, unspecified: Secondary | ICD-10-CM

## 2019-01-10 DIAGNOSIS — C61 Malignant neoplasm of prostate: Secondary | ICD-10-CM | POA: Diagnosis not present

## 2019-01-10 DIAGNOSIS — Z79899 Other long term (current) drug therapy: Secondary | ICD-10-CM

## 2019-01-10 DIAGNOSIS — Z8546 Personal history of malignant neoplasm of prostate: Secondary | ICD-10-CM

## 2019-01-10 DIAGNOSIS — M810 Age-related osteoporosis without current pathological fracture: Secondary | ICD-10-CM | POA: Diagnosis not present

## 2019-01-10 DIAGNOSIS — M549 Dorsalgia, unspecified: Secondary | ICD-10-CM | POA: Diagnosis not present

## 2019-01-10 DIAGNOSIS — M4850XA Collapsed vertebra, not elsewhere classified, site unspecified, initial encounter for fracture: Secondary | ICD-10-CM

## 2019-01-10 LAB — CBC WITH DIFFERENTIAL (CANCER CENTER ONLY)
Abs Immature Granulocytes: 0.02 10*3/uL (ref 0.00–0.07)
Basophils Absolute: 0 10*3/uL (ref 0.0–0.1)
Basophils Relative: 0 %
Eosinophils Absolute: 0 10*3/uL (ref 0.0–0.5)
Eosinophils Relative: 0 %
HCT: 40.8 % (ref 39.0–52.0)
Hemoglobin: 13.3 g/dL (ref 13.0–17.0)
Immature Granulocytes: 0 %
Lymphocytes Relative: 26 %
Lymphs Abs: 1.9 10*3/uL (ref 0.7–4.0)
MCH: 29.8 pg (ref 26.0–34.0)
MCHC: 32.6 g/dL (ref 30.0–36.0)
MCV: 91.3 fL (ref 80.0–100.0)
Monocytes Absolute: 0.8 10*3/uL (ref 0.1–1.0)
Monocytes Relative: 11 %
Neutro Abs: 4.6 10*3/uL (ref 1.7–7.7)
Neutrophils Relative %: 63 %
Platelet Count: 431 10*3/uL — ABNORMAL HIGH (ref 150–400)
RBC: 4.47 MIL/uL (ref 4.22–5.81)
RDW: 14 % (ref 11.5–15.5)
WBC Count: 7.4 10*3/uL (ref 4.0–10.5)
nRBC: 0 % (ref 0.0–0.2)

## 2019-01-10 LAB — CMP (CANCER CENTER ONLY)
ALT: 19 U/L (ref 0–44)
AST: 13 U/L — ABNORMAL LOW (ref 15–41)
Albumin: 3.8 g/dL (ref 3.5–5.0)
Alkaline Phosphatase: 72 U/L (ref 38–126)
Anion gap: 10 (ref 5–15)
BUN: 19 mg/dL (ref 8–23)
CO2: 23 mmol/L (ref 22–32)
Calcium: 9.5 mg/dL (ref 8.9–10.3)
Chloride: 100 mmol/L (ref 98–111)
Creatinine: 0.76 mg/dL (ref 0.61–1.24)
GFR, Est AFR Am: >60 mL/min (ref >60)
GFR, Estimated: >60 mL/min (ref >60)
Glucose, Bld: 90 mg/dL (ref 70–99)
Potassium: 4.7 mmol/L (ref 3.5–5.1)
Sodium: 133 mmol/L — ABNORMAL LOW (ref 135–145)
Total Bilirubin: 0.3 mg/dL (ref 0.3–1.2)
Total Protein: 6.9 g/dL (ref 6.5–8.1)

## 2019-01-10 MED ORDER — OXYCODONE HCL 5 MG PO TABS: 5.0000 mg | ORAL_TABLET | ORAL | 0 refills | Status: DC | PRN

## 2019-01-10 NOTE — Telephone Encounter (Signed)
Scheduled appt per 6/16 los. Printed calendar and avs.

## 2019-01-10 NOTE — Progress Notes (Signed)
Hematology and Oncology Follow Up Visit  [name] Holder [id number] [birth year] 63 y.o. 01/10/2019 2:54 PM [name] Holder, MDJones, [name] Right, MD   Principle Diagnosis: 63 year old man with castration-resistant prostate cancer diagnosed in 2018.  He was initially diagnosed in 2014.  Prior Therapy:  Androgen deprivation therapy in 2014.  He developed castration resistant in 2018.  He is status post T10, T12, kyphoplasty completed on January 22 of 2020.  Current therapy:  Zytiga 1000 mg daily with prednisone at 5 mg daily started in 2018.  Interim History: [name] Holder is here for return evaluation.  Since the last visit, he was hospitalized in the early part of June with altered mental status and presumed sepsis.  Since his discharge, he reports feeling much better at this time with improvement in his overall health and performance status.  He is ambulating short distances with the help of a walker without any falls or syncope.  He reports his back pain is manageable with oxycodone without any needing any additional pain medication.  He continues to tolerate Zytiga without any new complaints.  He denied headaches, blurry vision, syncope or seizures.  Denies any fevers, chills or sweats.  Denied chest pain, palpitation, orthopnea or leg edema.  Denied cough, wheezing or hemoptysis.  Denied nausea, vomiting or abdominal pain.  Denies any constipation or diarrhea.  Denies any frequency urgency or hesitancy.  Denies any arthralgias or myalgias.  Denies any skin rashes or lesions.  Denies any bleeding or clotting tendency.  Denies any easy bruising.  Denies any hair or nail changes.  Denies any anxiety or depression.  Remaining review of system is negative.      Medications: I have reviewed the patient's current medications.  Current Outpatient Medications  Medication Sig Dispense Refill  . abiraterone acetate (ZYTIGA) 250 MG tablet Take 1,000 mg by mouth daily.   10  . acetaminophen (TYLENOL)  325 MG tablet Take 325 mg by mouth daily as needed for fever.    [name] albuterol (PROAIR HFA) 108 (90 Base) MCG/ACT inhaler 2 PUFFS EVERY 6 HOURS AS NEEDED FOR WHEEZING 8.5 g 5  . albuterol (PROVENTIL) (2.5 MG/3ML) 0.083% nebulizer solution Take 3 mLs (2.5 mg total) by nebulization every 6 (six) hours as needed for wheezing or shortness of breath. 150 mL 1  . azelastine (ASTELIN) 0.1 % nasal spray Place 2 sprays into both nostrils every morning. (Patient taking differently: Place 2 sprays into both nostrils daily as needed for allergies. )    . b complex vitamins tablet Take 1 tablet by mouth daily.    . budesonide-formoterol (SYMBICORT) 160-4.5 MCG/ACT inhaler Inhale 2 puffs into the lungs 2 (two) times daily. 30.6 g 1  . CALCIUM-VITAMIN D PO Take 1 tablet by mouth 2 (two) times daily.     . carvedilol (COREG) 3.125 MG tablet Take 1 tablet (3.125 mg total) by mouth 2 (two) times daily with a meal. 180 tablet 1  . cyanocobalamin (,VITAMIN B-12,) 1000 MCG/ML injection Inject 1,000 mcg into the muscle every 30 (thirty) days.   4  . diclofenac sodium (VOLTAREN) 1 % GEL Apply 4 g topically 4 (four) times daily. (Patient not taking: Reported on 12/27/2018)    . ipratropium-albuterol (DUONEB) 0.5-2.5 (3) MG/3ML SOLN Take 3 mLs by nebulization every 4 (four) hours as needed. 360 mL 1  . Leuprolide Acetate (LUPRON IJ) Inject 1 Dose as directed every 4 (four) months. This is administered by [facility]    . loperamide (IMODIUM) 2  MG capsule Take 2 capsules (4 mg total) by mouth every 6 (six) hours as needed for diarrhea or loose stools. 60 capsule 0  . meclizine (ANTIVERT) 12.5 MG tablet Take 1 tablet (12.5 mg total) by mouth 3 (three) times daily as needed for dizziness. 270 tablet 1  . mometasone (NASONEX) 50 MCG/ACT nasal spray Place 2 sprays into the nose daily. 51 g 1  . Multiple Vitamins-Minerals (CENTRUM SILVER 50+MEN PO) Take 1 tablet by mouth daily.    [name] NARCAN 4 MG/0.1ML LIQD nasal spray kit Place 0.4  mg into the nose once.     [name] olmesartan (BENICAR) 20 MG tablet Take 1 tablet (20 mg total) by mouth 2 (two) times daily. 60 tablet 1  . oxyCODONE (OXY IR/ROXICODONE) 5 MG immediate release tablet Take 1 tablet (5 mg total) by mouth every 4 (four) hours as needed for severe pain. (Patient taking differently: Take 2.5 mg by mouth every 4 (four) hours as needed for severe pain. ) 180 tablet 0  . predniSONE (DELTASONE) 5 MG tablet Take 1 tablet (5 mg total) by mouth daily with breakfast. (Patient taking differently: Take 5 mg by mouth 2 (two) times daily with a meal. )    . SYRINGE-NEEDLE, DISP, 3 ML 3 ML MISC Use to inject b12 monthly. 12 each 0  . tamsulosin (FLOMAX) 0.4 MG CAPS capsule Take 1 capsule (0.4 mg total) by mouth daily. (Patient not taking: Reported on 12/27/2018) 30 capsule    No current facility-administered medications for this visit.      Allergies:  Allergies  Allergen Reactions  . Amlodipine Swelling    edema  . Tetracycline Rash    Past Medical History, Surgical history, Social history, and Family History were reviewed and updated.   Physical Exam:  Blood pressure 138/80, pulse 76, temperature 98.7 F (37.1 C), temperature source Oral, resp. rate 18, height 5' 7" (1.702 m), weight 167 lb 8 oz (76 kg), SpO2 100 %.   ECOG: 1   General appearance: Comfortable appearing without any discomfort Head: Normocephalic without any trauma Oropharynx: Mucous membranes are moist and pink without any thrush or ulcers. Eyes: Pupils are equal and round reactive to light. Lymph nodes: No cervical, supraclavicular, inguinal or axillary lymphadenopathy.   Heart:regular rate and rhythm.  S1 and S2 without leg edema. Lung: Clear without any rhonchi or wheezes.  No dullness to percussion. Abdomin: Soft, nontender, nondistended with good bowel sounds.  No hepatosplenomegaly. Musculoskeletal: No joint deformity or effusion.  Full range of motion noted. Neurological: No deficits noted on  motor, sensory and deep tendon reflex exam. Skin: No petechial rash or dryness.  Appeared moist.        Lab Results: Lab Results  Component Value Date   WBC 7.4 01/10/2019   HGB 13.3 01/10/2019   HCT 40.8 01/10/2019   MCV 91.3 01/10/2019   PLT 431 (H) 01/10/2019     Chemistry      Component Value Date/Time   NA 134 (L) 12/30/2018 0759   K 3.3 (L) 12/30/2018 0759   CL 110 12/30/2018 0759   CO2 19 (L) 12/30/2018 0759   BUN 14 12/30/2018 0759   CREATININE 0.48 (L) 12/30/2018 0759   CREATININE 0.74 10/11/2018 1537      Component Value Date/Time   CALCIUM 8.4 (L) 12/30/2018 0759   ALKPHOS 50 12/29/2018 0836   AST 23 12/29/2018 0836   AST 18 10/11/2018 1537   ALT 33 12/29/2018 0836   ALT 20 10/11/2018  1537   BILITOT 0.5 12/29/2018 0836   BILITOT 0.5 10/11/2018 1537    Results for [name] Holder, [name] Holder (MRN [id number]) as of 01/10/2019 14:32  Ref. Range 10/11/2018 15:37  Prostate Specific Ag, Serum Latest Ref Range: 0.0 - 4.0 ng/mL 0.3      Impression and Plan:  63 year old man with:  1.    Advanced prostate cancer with lymphadenopathy that is currently castration-resistant since 2018.  He has tolerated Zytiga without any major complaints at this time.  His PSA in March 2020 continues to show be under control.  Risks and benefits of continuing this treatment was discussed.  Different salvage therapy such as systemic chemotherapy would be next option if needed.  The natural course of his disease as well as the nature of progression was also discussed.  His wife was also updated via phone.  2.  Compression fracture tween T10 and T12.    Related to osteoporosis and no malignancy.   3.  Bone directed therapy: He continues to receive Prolia with calcium and vitamin D supplements under the care of Dr. Alinda Money.  4.  Pain: Manageable with oxycodone which we refilled for him.  5.  Prognosis and goals of care: His disease is incurable although aggressive therapy is warranted  given his reasonable performance status.  6.  Constipation: Bowel movements are more regular at this time.  He did have episode of diarrhea before hospitalization.  7.  Follow-up: MD follow-up in 3 months.  25  minutes was spent with the patient face-to-face today.  More than 50% of time was spent on reviewing his disease status, treatment options and dealing with complications related to treatment.     Zola Button, MD 6/16/20202:54 PM

## 2019-01-11 ENCOUNTER — Telehealth: Payer: Self-pay

## 2019-01-11 LAB — PROSTATE-SPECIFIC AG, SERUM (LABCORP): Prostate Specific Ag, Serum: 0.2 ng/mL (ref 0.0–4.0)

## 2019-01-11 NOTE — Telephone Encounter (Signed)
-----   Message from Wyatt Portela, MD sent at 01/11/2019  8:19 AM EDT ----- Please let him know his PSA is still low.

## 2019-01-11 NOTE — Telephone Encounter (Signed)
Contacted patient and made aware of PSA and reviewed other labs per patient request.

## 2019-02-07 ENCOUNTER — Other Ambulatory Visit: Payer: Self-pay | Admitting: Internal Medicine

## 2019-02-07 DIAGNOSIS — J4521 Mild intermittent asthma with (acute) exacerbation: Secondary | ICD-10-CM

## 2019-02-10 DIAGNOSIS — C7951 Secondary malignant neoplasm of bone: Secondary | ICD-10-CM | POA: Diagnosis not present

## 2019-02-10 DIAGNOSIS — C7952 Secondary malignant neoplasm of bone marrow: Secondary | ICD-10-CM | POA: Diagnosis not present

## 2019-02-23 ENCOUNTER — Other Ambulatory Visit: Payer: Self-pay | Admitting: Internal Medicine

## 2019-02-23 DIAGNOSIS — I951 Orthostatic hypotension: Secondary | ICD-10-CM

## 2019-02-23 DIAGNOSIS — E871 Hypo-osmolality and hyponatremia: Secondary | ICD-10-CM

## 2019-03-07 ENCOUNTER — Other Ambulatory Visit: Payer: Self-pay | Admitting: Internal Medicine

## 2019-03-07 DIAGNOSIS — J301 Allergic rhinitis due to pollen: Secondary | ICD-10-CM

## 2019-03-07 DIAGNOSIS — J4521 Mild intermittent asthma with (acute) exacerbation: Secondary | ICD-10-CM

## 2019-03-09 ENCOUNTER — Other Ambulatory Visit: Payer: Self-pay | Admitting: Oncology

## 2019-03-09 MED ORDER — OXYCODONE HCL 5 MG PO TABS: 5.0000 mg | ORAL_TABLET | ORAL | 0 refills | Status: DC | PRN

## 2019-03-13 DIAGNOSIS — C7951 Secondary malignant neoplasm of bone: Secondary | ICD-10-CM | POA: Diagnosis not present

## 2019-03-13 DIAGNOSIS — C7952 Secondary malignant neoplasm of bone marrow: Secondary | ICD-10-CM | POA: Diagnosis not present

## 2019-03-16 DIAGNOSIS — C61 Malignant neoplasm of prostate: Secondary | ICD-10-CM | POA: Diagnosis not present

## 2019-03-24 DIAGNOSIS — C775 Secondary and unspecified malignant neoplasm of intrapelvic lymph nodes: Secondary | ICD-10-CM | POA: Diagnosis not present

## 2019-03-24 DIAGNOSIS — C61 Malignant neoplasm of prostate: Secondary | ICD-10-CM | POA: Diagnosis not present

## 2019-03-24 DIAGNOSIS — C678 Malignant neoplasm of overlapping sites of bladder: Secondary | ICD-10-CM | POA: Diagnosis not present

## 2019-03-24 DIAGNOSIS — C7951 Secondary malignant neoplasm of bone: Secondary | ICD-10-CM | POA: Diagnosis not present

## 2019-04-12 ENCOUNTER — Telehealth: Payer: Self-pay

## 2019-04-12 NOTE — Telephone Encounter (Signed)
Pt is calling back for medications to be refilled.

## 2019-04-13 ENCOUNTER — Other Ambulatory Visit: Payer: Self-pay | Admitting: Internal Medicine

## 2019-04-13 ENCOUNTER — Telehealth: Payer: Medicare Other | Admitting: Internal Medicine

## 2019-04-13 DIAGNOSIS — J4520 Mild intermittent asthma, uncomplicated: Secondary | ICD-10-CM

## 2019-04-13 DIAGNOSIS — C7952 Secondary malignant neoplasm of bone marrow: Secondary | ICD-10-CM | POA: Diagnosis not present

## 2019-04-13 DIAGNOSIS — C7951 Secondary malignant neoplasm of bone: Secondary | ICD-10-CM | POA: Diagnosis not present

## 2019-04-13 DIAGNOSIS — J4521 Mild intermittent asthma with (acute) exacerbation: Secondary | ICD-10-CM

## 2019-04-13 MED ORDER — IPRATROPIUM-ALBUTEROL 0.5-2.5 (3) MG/3ML IN SOLN: 3.0000 mL | RESPIRATORY_TRACT | 1 refills | Status: DC | PRN

## 2019-04-13 MED ORDER — ALBUTEROL SULFATE (2.5 MG/3ML) 0.083% IN NEBU: INHALATION_SOLUTION | RESPIRATORY_TRACT | 1 refills | Status: DC

## 2019-04-13 MED ORDER — ALBUTEROL SULFATE HFA 108 (90 BASE) MCG/ACT IN AERS: INHALATION_SPRAY | RESPIRATORY_TRACT | 5 refills | Status: DC

## 2019-04-13 NOTE — Telephone Encounter (Signed)
Pt states that he was supposed to call Sheridan back and let her know if he didn't have enough of any medications to get him through until Tuesday. Pt states that there are two that he needs: albuterol (PROVENTIL) (2.5 MG/3ML) 0.083% nebulizer solution ipratropium-albuterol (DUONEB) 0.5-2.5 (3) MG/3ML SOLN  Pt can be reached at [phone]

## 2019-04-14 ENCOUNTER — Inpatient Hospital Stay: Payer: Medicare Other | Admitting: Oncology

## 2019-04-14 ENCOUNTER — Inpatient Hospital Stay: Payer: Medicare Other | Attending: Oncology

## 2019-04-14 ENCOUNTER — Other Ambulatory Visit: Payer: Self-pay

## 2019-04-14 VITALS — BP 148/84 | HR 75 | Temp 98.2°F | Resp 17 | Ht 67.0 in | Wt 197.0 lb

## 2019-04-14 DIAGNOSIS — Z8546 Personal history of malignant neoplasm of prostate: Secondary | ICD-10-CM

## 2019-04-14 DIAGNOSIS — Z79899 Other long term (current) drug therapy: Secondary | ICD-10-CM | POA: Insufficient documentation

## 2019-04-14 DIAGNOSIS — C61 Malignant neoplasm of prostate: Secondary | ICD-10-CM | POA: Diagnosis not present

## 2019-04-14 DIAGNOSIS — M810 Age-related osteoporosis without current pathological fracture: Secondary | ICD-10-CM | POA: Insufficient documentation

## 2019-04-14 DIAGNOSIS — M4854XA Collapsed vertebra, not elsewhere classified, thoracic region, initial encounter for fracture: Secondary | ICD-10-CM | POA: Insufficient documentation

## 2019-04-14 DIAGNOSIS — M4850XA Collapsed vertebra, not elsewhere classified, site unspecified, initial encounter for fracture: Secondary | ICD-10-CM

## 2019-04-14 DIAGNOSIS — K5900 Constipation, unspecified: Secondary | ICD-10-CM | POA: Insufficient documentation

## 2019-04-14 LAB — CBC WITH DIFFERENTIAL (CANCER CENTER ONLY)
Abs Immature Granulocytes: 0.04 10*3/uL (ref 0.00–0.07)
Basophils Absolute: 0 10*3/uL (ref 0.0–0.1)
Basophils Relative: 0 %
Eosinophils Absolute: 0.1 10*3/uL (ref 0.0–0.5)
Eosinophils Relative: 1 %
HCT: 37.9 % — ABNORMAL LOW (ref 39.0–52.0)
Hemoglobin: 13.4 g/dL (ref 13.0–17.0)
Immature Granulocytes: 0 %
Lymphocytes Relative: 29 %
Lymphs Abs: 3 10*3/uL (ref 0.7–4.0)
MCH: 30.8 pg (ref 26.0–34.0)
MCHC: 35.4 g/dL (ref 30.0–36.0)
MCV: 87.1 fL (ref 80.0–100.0)
Monocytes Absolute: 0.8 10*3/uL (ref 0.1–1.0)
Monocytes Relative: 8 %
Neutro Abs: 6.5 10*3/uL (ref 1.7–7.7)
Neutrophils Relative %: 62 %
Platelet Count: 244 10*3/uL (ref 150–400)
RBC: 4.35 MIL/uL (ref 4.22–5.81)
RDW: 12.9 % (ref 11.5–15.5)
WBC Count: 10.5 10*3/uL (ref 4.0–10.5)
nRBC: 0 % (ref 0.0–0.2)

## 2019-04-14 LAB — CMP (CANCER CENTER ONLY)
ALT: 17 U/L (ref 0–44)
AST: 15 U/L (ref 15–41)
Albumin: 4.1 g/dL (ref 3.5–5.0)
Alkaline Phosphatase: 56 U/L (ref 38–126)
Anion gap: 7 (ref 5–15)
BUN: 13 mg/dL (ref 8–23)
CO2: 26 mmol/L (ref 22–32)
Calcium: 9.5 mg/dL (ref 8.9–10.3)
Chloride: 100 mmol/L (ref 98–111)
Creatinine: 0.81 mg/dL (ref 0.61–1.24)
GFR, Est AFR Am: >60 mL/min (ref >60)
GFR, Estimated: >60 mL/min (ref >60)
Glucose, Bld: 107 mg/dL — ABNORMAL HIGH (ref 70–99)
Potassium: 4.5 mmol/L (ref 3.5–5.1)
Sodium: 133 mmol/L — ABNORMAL LOW (ref 135–145)
Total Bilirubin: 0.4 mg/dL (ref 0.3–1.2)
Total Protein: 6.5 g/dL (ref 6.5–8.1)

## 2019-04-14 LAB — PSA: PSA: 0.2

## 2019-04-14 NOTE — Progress Notes (Signed)
Hematology and Oncology Follow Up Visit  [name] Holder [id number] 04-28-1956 63 y.o. 04/14/2019 9:07 AM [name], MDJones, [name], MD   Principle Diagnosis: 63 year old man with advanced prostate cancer confirmed in 2018.  He has castration-resistant after presenting with advanced disease in 2014.  Prior Therapy:  Androgen deprivation therapy in 2014.  He developed castration resistant in 2018.  He is status post T10, T12, kyphoplasty completed on January 22 of 2020.  Current therapy:  Zytiga 1000 mg daily with prednisone at 5 mg daily started in 2018.  Interim History: [name] is here for a follow-up.  Since the last visit, he reports no major changes in his health.  He continues to tolerate Zytiga without any complications.  He denies any nausea vomiting or lower extremity edema.  He is eating better and overall reasonable quality of life.  Continues to have chronic pain related to his compression fractures but manageable with the current oxycodone dosing.  He is ambulating short distances without any walker or cane but does require wheelchair for an extended ambulation.  He is working on exercises moving forward.   Patient denied any alteration mental status, neuropathy, confusion or dizziness.  Denies any headaches or lethargy.  Denies any night sweats, weight loss or changes in appetite.  Denied orthopnea, dyspnea on exertion or chest discomfort.  Denies shortness of breath, difficulty breathing hemoptysis or cough.  Denies any abdominal distention, nausea, early satiety or dyspepsia.  Denies any hematuria, frequency, dysuria or nocturia.  Denies any skin irritation, dryness or rash.  Denies any ecchymosis or petechiae.  Denies any lymphadenopathy or clotting.  Denies any heat or cold intolerance.  Denies any anxiety or depression.  Remaining review of system is negative.           Medications: Updated on review. Current Outpatient Medications  Medication Sig  Dispense Refill  . abiraterone acetate (ZYTIGA) 250 MG tablet Take 1,000 mg by mouth daily.   10  . acetaminophen (TYLENOL) 325 MG tablet Take 325 mg by mouth daily as needed for fever.    [name] albuterol (PROAIR HFA) 108 (90 Base) MCG/ACT inhaler 2 PUFFS EVERY 6 HOURS AS NEEDED FOR WHEEZING 8.5 g 5  . albuterol (PROVENTIL) (2.5 MG/3ML) 0.083% nebulizer solution NEBULIZE 1 VIAL EVERY 6-8 HOURS AS NEEDED FOR WHEEZING OR SHORTNESS OF BREATH 180 mL 1  . azelastine (ASTELIN) 0.1 % nasal spray Place 2 sprays into both nostrils every morning. (Patient taking differently: Place 2 sprays into both nostrils daily as needed for allergies. )    . b complex vitamins tablet Take 1 tablet by mouth daily.    . budesonide-formoterol (SYMBICORT) 160-4.5 MCG/ACT inhaler Inhale 2 puffs into the lungs 2 (two) times daily. 30.6 g 1  . CALCIUM-VITAMIN D PO Take 1 tablet by mouth 2 (two) times daily.     . carvedilol (COREG) 3.125 MG tablet Take 1 tablet (3.125 mg total) by mouth 2 (two) times daily with a meal. 180 tablet 1  . cyanocobalamin (,VITAMIN B-12,) 1000 MCG/ML injection Inject 1,000 mcg into the muscle every 30 (thirty) days.   4  . diclofenac sodium (VOLTAREN) 1 % GEL Apply 4 g topically 4 (four) times daily. (Patient not taking: Reported on 12/27/2018)    . ipratropium-albuterol (DUONEB) 0.5-2.5 (3) MG/3ML SOLN Take 3 mLs by nebulization every 4 (four) hours as needed. 360 mL 1  . Leuprolide Acetate (LUPRON IJ) Inject 1 Dose as directed every 4 (four) months. This is administered  by [facility]    . loperamide (IMODIUM) 2 MG capsule Take 2 capsules (4 mg total) by mouth every 6 (six) hours as needed for diarrhea or loose stools. 60 capsule 0  . meclizine (ANTIVERT) 12.5 MG tablet Take 1 tablet (12.5 mg total) by mouth 3 (three) times daily as needed for dizziness. 270 tablet 1  . mometasone (NASONEX) 50 MCG/ACT nasal spray USE 2 SPRAYS IN EACH NOSTRIL ONCE DAILY. 51 g 0  . Multiple Vitamins-Minerals (CENTRUM  SILVER 50+MEN PO) Take 1 tablet by mouth daily.    [name] NARCAN 4 MG/0.1ML LIQD nasal spray kit Place 0.4 mg into the nose once.     [name] olmesartan (BENICAR) 20 MG tablet Take 1 tablet (20 mg total) by mouth 2 (two) times daily. 60 tablet 1  . oxyCODONE (OXY IR/ROXICODONE) 5 MG immediate release tablet Take 1 tablet (5 mg total) by mouth every 4 (four) hours as needed for severe pain. 180 tablet 0  . predniSONE (DELTASONE) 5 MG tablet Take 1 tablet (5 mg total) by mouth daily with breakfast. (Patient taking differently: Take 5 mg by mouth 2 (two) times daily with a meal. )    . SYRINGE-NEEDLE, DISP, 3 ML 3 ML MISC Use to inject b12 monthly. 12 each 0  . tamsulosin (FLOMAX) 0.4 MG CAPS capsule Take 1 capsule (0.4 mg total) by mouth daily. (Patient not taking: Reported on 12/27/2018) 30 capsule    No current facility-administered medications for this visit.      Allergies:  Allergies  Allergen Reactions  . Amlodipine Swelling    edema  . Tetracycline Rash    Past Medical History, Surgical history, Social history, and Family History without any changes on review.   Physical Exam:  Blood pressure (!) 148/84, pulse 75, temperature 98.2 F (36.8 C), temperature source Oral, resp. rate 17, height _0  (1.702 m), weight 197 lb (89.4 kg), SpO2 100 %.    ECOG: 1     General appearance: Alert, awake without any distress. Head: Atraumatic without abnormalities Oropharynx: Without any thrush or ulcers. Eyes: No scleral icterus. Lymph nodes: No lymphadenopathy noted in the cervical, supraclavicular, or axillary nodes Heart:regular rate and rhythm, without any murmurs or gallops.   Lung: Clear to auscultation without any rhonchi, wheezes or dullness to percussion. Abdomin: Soft, nontender without any shifting dullness or ascites. Musculoskeletal: No clubbing or cyanosis. Neurological: No motor or sensory deficits. Skin: No rashes or lesions. Psychiatric: Mood and affect appeared  normal.        Lab Results: Lab Results  Component Value Date   WBC 7.4 01/10/2019   HGB 13.3 01/10/2019   HCT 40.8 01/10/2019   MCV 91.3 01/10/2019   PLT 431 (H) 01/10/2019     Chemistry      Component Value Date/Time   NA 133 (L) 01/10/2019 1445   K 4.7 01/10/2019 1445   CL 100 01/10/2019 1445   CO2 23 01/10/2019 1445   BUN 19 01/10/2019 1445   CREATININE 0.76 01/10/2019 1445      Component Value Date/Time   CALCIUM 9.5 01/10/2019 1445   ALKPHOS 72 01/10/2019 1445   AST 13 (L) 01/10/2019 1445   ALT 19 01/10/2019 1445   BILITOT 0.3 01/10/2019 1445     Results for JAVONTAY, VANDAM (MRN [id number]) as of 04/14/2019 09:09  Ref. Range 10/11/2018 15:37 01/10/2019 14:45  Prostate Specific Ag, Serum Latest Ref Range: 0.0 - 4.0 ng/mL 0.3 0.2      Impression  and Plan:  63 year old man with:  1.    Castration-resistant prostate cancer with lymphadenopathy noted in 2018.   He is currently on Zytiga with excellent PSA response that is currently at 0.2 in June 2020.  Natural course of this disease and treatment options were reviewed.  Different salvage options such as systemic chemotherapy were also reiterated.  At this time I recommended continuing Zytiga with the same dose and schedule.  He is agreeable to continue at this time.     2.  Compression fracture tween T10 and T12: No evidence of metastatic disease to the bone noted.  This is related to osteoporosis.   3.  Bone directed therapy: He is under consideration to start Prolia under the care of Dr. Alinda Money once that is dental issues are resolved.  4.  Pain: Related to his compression fracture.  He is currently on oxycodone which he takes without any issues.  5.  Prognosis and goals of care: Therapy remains palliative at this time although aggressive measures are warranted.   6.  Constipation: Appropriate bowel regimen is available to him.  7.  Follow-up: He will return in 3 months.  25  minutes was  spent with the patient face-to-face today.  More than 50% of time was dedicated to updating his disease status, treatment options and answering questions regarding future plan of care.     Zola Button, MD 9/18/20209:07 AM

## 2019-04-15 LAB — PROSTATE-SPECIFIC AG, SERUM (LABCORP): Prostate Specific Ag, Serum: 0.2 ng/mL (ref 0.0–4.0)

## 2019-04-17 ENCOUNTER — Telehealth: Payer: Self-pay

## 2019-04-17 ENCOUNTER — Telehealth: Payer: Self-pay | Admitting: Oncology

## 2019-04-17 ENCOUNTER — Other Ambulatory Visit: Payer: Self-pay | Admitting: Oncology

## 2019-04-17 MED ORDER — OXYCODONE HCL 5 MG PO TABS: 5.0000 mg | ORAL_TABLET | ORAL | 0 refills | Status: DC | PRN

## 2019-04-17 NOTE — Telephone Encounter (Signed)
No los nor sch message per 9/18. °

## 2019-04-17 NOTE — Telephone Encounter (Signed)
-----   Message from Wyatt Portela, MD sent at 04/17/2019  7:54 AM EDT ----- Please let him know his PSA is still low.

## 2019-04-17 NOTE — Telephone Encounter (Signed)
Called patient to inform him of low PSA. Patient verbalized understanding.

## 2019-04-18 ENCOUNTER — Other Ambulatory Visit: Payer: Self-pay

## 2019-04-18 ENCOUNTER — Encounter: Payer: Self-pay | Admitting: Internal Medicine

## 2019-04-18 ENCOUNTER — Ambulatory Visit (INDEPENDENT_AMBULATORY_CARE_PROVIDER_SITE_OTHER): Payer: Medicare Other | Admitting: Internal Medicine

## 2019-04-18 ENCOUNTER — Other Ambulatory Visit (INDEPENDENT_AMBULATORY_CARE_PROVIDER_SITE_OTHER): Payer: Medicare Other

## 2019-04-18 VITALS — BP 142/88 | HR 74 | Temp 99.1°F | Resp 16 | Ht 67.0 in | Wt 200.0 lb

## 2019-04-18 DIAGNOSIS — E785 Hyperlipidemia, unspecified: Secondary | ICD-10-CM

## 2019-04-18 DIAGNOSIS — E871 Hypo-osmolality and hyponatremia: Secondary | ICD-10-CM | POA: Diagnosis not present

## 2019-04-18 DIAGNOSIS — D638 Anemia in other chronic diseases classified elsewhere: Secondary | ICD-10-CM | POA: Diagnosis not present

## 2019-04-18 DIAGNOSIS — I10 Essential (primary) hypertension: Secondary | ICD-10-CM | POA: Diagnosis not present

## 2019-04-18 LAB — LIPID PANEL
Cholesterol: 174 mg/dL (ref 0–200)
HDL: 69.9 mg/dL (ref >39.00)
LDL Cholesterol: 81 mg/dL (ref 0–99)
NonHDL: 103.94
Total CHOL/HDL Ratio: 2
Triglycerides: 115 mg/dL (ref 0.0–149.0)
VLDL: 23 mg/dL (ref 0.0–40.0)

## 2019-04-18 LAB — BASIC METABOLIC PANEL
BUN: 17 mg/dL (ref 6–23)
CO2: 25 mEq/L (ref 19–32)
Calcium: 9.5 mg/dL (ref 8.4–10.5)
Chloride: 96 mEq/L (ref 96–112)
Creatinine, Ser: 0.63 mg/dL (ref 0.40–1.50)
GFR: 127.72 mL/min (ref >60.00)
Glucose, Bld: 107 mg/dL — ABNORMAL HIGH (ref 70–99)
Potassium: 4.2 mEq/L (ref 3.5–5.1)
Sodium: 128 mEq/L — ABNORMAL LOW (ref 135–145)

## 2019-04-18 LAB — CBC WITH DIFFERENTIAL/PLATELET
Basophils Absolute: 0 10*3/uL (ref 0.0–0.1)
Basophils Relative: 0.4 % (ref 0.0–3.0)
Eosinophils Absolute: 0.1 10*3/uL (ref 0.0–0.7)
Eosinophils Relative: 0.8 % (ref 0.0–5.0)
HCT: 38 % — ABNORMAL LOW (ref 39.0–52.0)
Hemoglobin: 13.2 g/dL (ref 13.0–17.0)
Lymphocytes Relative: 18.9 % (ref 12.0–46.0)
Lymphs Abs: 2 10*3/uL (ref 0.7–4.0)
MCHC: 34.6 g/dL (ref 30.0–36.0)
MCV: 89.2 fl (ref 78.0–100.0)
Monocytes Absolute: 0.9 10*3/uL (ref 0.1–1.0)
Monocytes Relative: 8.6 % (ref 3.0–12.0)
Neutro Abs: 7.5 10*3/uL (ref 1.4–7.7)
Neutrophils Relative %: 71.3 % (ref 43.0–77.0)
Platelets: 244 10*3/uL (ref 150.0–400.0)
RBC: 4.26 Mil/uL (ref 4.22–5.81)
RDW: 13.9 % (ref 11.5–15.5)
WBC: 10.6 10*3/uL — ABNORMAL HIGH (ref 4.0–10.5)

## 2019-04-18 MED ORDER — CARVEDILOL 3.125 MG PO TABS: 3.1250 mg | ORAL_TABLET | Freq: Two times a day (BID) | ORAL | 1 refills | Status: DC

## 2019-04-18 NOTE — Patient Instructions (Signed)

## 2019-04-18 NOTE — Progress Notes (Signed)
Subjective:  Patient ID: [name] Holder, male    DOB: 1955/11/08  Age: 63 y.o. MRN: [id number]  CC: Anemia, Hypertension, and Hyperlipidemia   HPI NORVELL CASWELL presents for f/up - He tells me his blood pressure has been well controlled.  He is ambulatory in his home and denies any recent episodes of CP or DOE.  He is in a wheelchair when he leaves the home.  He has stable anemia and his vitamin levels have all been normal within the last 6 months.  Outpatient Medications Prior to Visit  Medication Sig Dispense Refill  . abiraterone acetate (ZYTIGA) 250 MG tablet Take 1,000 mg by mouth daily.   10  . acetaminophen (TYLENOL) 325 MG tablet Take 325 mg by mouth daily as needed for fever.    [name] albuterol (PROAIR HFA) 108 (90 Base) MCG/ACT inhaler 2 PUFFS EVERY 6 HOURS AS NEEDED FOR WHEEZING 8.5 g 5  . albuterol (PROVENTIL) (2.5 MG/3ML) 0.083% nebulizer solution NEBULIZE 1 VIAL EVERY 6-8 HOURS AS NEEDED FOR WHEEZING OR SHORTNESS OF BREATH 180 mL 1  . azelastine (ASTELIN) 0.1 % nasal spray Place 2 sprays into both nostrils every morning. (Patient taking differently: Place 2 sprays into both nostrils daily as needed for allergies. )    . b complex vitamins tablet Take 1 tablet by mouth daily.    . budesonide-formoterol (SYMBICORT) 160-4.5 MCG/ACT inhaler Inhale 2 puffs into the lungs 2 (two) times daily. 30.6 g 1  . CALCIUM-VITAMIN D PO Take 1 tablet by mouth 2 (two) times daily.     . cyanocobalamin (,VITAMIN B-12,) 1000 MCG/ML injection Inject 1,000 mcg into the muscle every 30 (thirty) days.   4  . diclofenac sodium (VOLTAREN) 1 % GEL Apply 4 g topically 4 (four) times daily.    [name] ipratropium-albuterol (DUONEB) 0.5-2.5 (3) MG/3ML SOLN Take 3 mLs by nebulization every 4 (four) hours as needed. 360 mL 1  . Leuprolide Acetate (LUPRON IJ) Inject 1 Dose as directed every 4 (four) months. This is administered by [facility]    . loperamide (IMODIUM) 2 MG capsule Take 2 capsules (4 mg total)  by mouth every 6 (six) hours as needed for diarrhea or loose stools. 60 capsule 0  . meclizine (ANTIVERT) 12.5 MG tablet Take 1 tablet (12.5 mg total) by mouth 3 (three) times daily as needed for dizziness. 270 tablet 1  . mometasone (NASONEX) 50 MCG/ACT nasal spray USE 2 SPRAYS IN EACH NOSTRIL ONCE DAILY. 51 g 0  . Multiple Vitamins-Minerals (CENTRUM SILVER 50+MEN PO) Take 1 tablet by mouth daily.    [name] NARCAN 4 MG/0.1ML LIQD nasal spray kit Place 0.4 mg into the nose once.     [name] olmesartan (BENICAR) 20 MG tablet Take 1 tablet (20 mg total) by mouth 2 (two) times daily. 60 tablet 1  . oxyCODONE (OXY IR/ROXICODONE) 5 MG immediate release tablet Take 1 tablet (5 mg total) by mouth every 4 (four) hours as needed for severe pain. 180 tablet 0  . predniSONE (DELTASONE) 5 MG tablet Take 1 tablet (5 mg total) by mouth daily with breakfast. (Patient taking differently: Take 5 mg by mouth 2 (two) times daily with a meal. )    . SYRINGE-NEEDLE, DISP, 3 ML 3 ML MISC Use to inject b12 monthly. 12 each 0  . tamsulosin (FLOMAX) 0.4 MG CAPS capsule Take 1 capsule (0.4 mg total) by mouth daily. 30 capsule   . carvedilol (COREG) 3.125 MG tablet Take 1 tablet (3.125  mg total) by mouth 2 (two) times daily with a meal. 180 tablet 1   No facility-administered medications prior to visit.     ROS Review of Systems  Constitutional: Negative for diaphoresis, fatigue and unexpected weight change.  HENT: Negative.   Eyes: Positive for visual disturbance (wt gain).  Respiratory: Negative for cough, chest tightness, shortness of breath and wheezing.   Cardiovascular: Negative for chest pain, palpitations and leg swelling.  Gastrointestinal: Negative for abdominal pain, anal bleeding, blood in stool, constipation, diarrhea, nausea and vomiting.  Endocrine: Negative.   Genitourinary: Negative.  Negative for difficulty urinating and dysuria.  Musculoskeletal: Positive for back pain. Negative for arthralgias and myalgias.   Skin: Negative.  Negative for color change.  Neurological: Negative for dizziness, weakness and light-headedness.  Hematological: Negative for adenopathy. Does not bruise/bleed easily.  Psychiatric/Behavioral: Negative.     Objective:  BP (!) 142/88 (BP Location: Left Arm, Patient Position: Sitting, Cuff Size: Normal)   Pulse 74   Temp 99.1 F (37.3 C) (Oral)   Resp 16   Ht '5\' 7"'$  (1.702 m)   Wt 200 lb (90.7 kg)   SpO2 97%   BMI 31.32 kg/m   BP Readings from Last 3 Encounters:  04/18/19 (!) 142/88  04/14/19 (!) 148/84  01/10/19 138/80    Wt Readings from Last 3 Encounters:  04/18/19 200 lb (90.7 kg)  04/14/19 197 lb (89.4 kg)  01/10/19 167 lb 8 oz (76 kg)    Physical Exam Constitutional:      Appearance: He is ill-appearing.     Comments: He is in a wheelchair today  HENT:     Nose: Nose normal.     Mouth/Throat:     Mouth: Mucous membranes are moist.  Eyes:     General: No scleral icterus.    Conjunctiva/sclera: Conjunctivae normal.  Neck:     Musculoskeletal: Normal range of motion and neck supple.  Cardiovascular:     Rate and Rhythm: Normal rate and regular rhythm.     Heart sounds: No murmur.  Pulmonary:     Effort: Pulmonary effort is normal.     Breath sounds: No stridor. No wheezing, rhonchi or rales.  Abdominal:     General: Abdomen is protuberant. Bowel sounds are normal.     Palpations: There is no hepatomegaly.     Tenderness: There is no abdominal tenderness.  Musculoskeletal: Normal range of motion.     Right lower leg: No edema.     Left lower leg: No edema.  Lymphadenopathy:     Cervical: No cervical adenopathy.  Skin:    General: Skin is warm and dry.     Coloration: Skin is not pale.  Neurological:     General: No focal deficit present.     Mental Status: He is alert and oriented to person, place, and time.  Psychiatric:        Mood and Affect: Mood normal.        Behavior: Behavior normal.     Lab Results  Component Value Date    WBC 10.6 (H) 04/18/2019   HGB 13.2 04/18/2019   HCT 38.0 (L) 04/18/2019   PLT 244.0 04/18/2019   GLUCOSE 107 (H) 04/18/2019   CHOL 174 04/18/2019   TRIG 115.0 04/18/2019   HDL 69.90 04/18/2019   LDLCALC 81 04/18/2019   ALT 17 04/14/2019   AST 15 04/14/2019   NA 128 (L) 04/18/2019   K 4.2 04/18/2019   CL 96 04/18/2019  CREATININE 0.63 04/18/2019   BUN 17 04/18/2019   CO2 25 04/18/2019   TSH 1.006 12/27/2018   PSA 0.2 04/14/2019   INR 0.94 08/26/2018   HGBA1C 5.1 11/25/2018    Dg Lumbar Spine 2-3 Views  Result Date: 12/27/2018 CLINICAL DATA:  Acute on chronic low back pain. History of prior compression fractures status post kyphoplasty. EXAM: LUMBAR SPINE - 2-3 VIEW COMPARISON:  CT abdomen pelvis dated September 13, 2018. FINDINGS: Five lumbar type vertebral bodies. No acute fracture or subluxation. Unchanged chronic mild L3 central superior endplate compression fracture. Unchanged chronic T10, T12, L1, and L2 compression deformities status post cement augmentation. Unchanged trace retrolisthesis at L3-L4 and trace anterolisthesis at L5-S1. Unchanged bilateral L5 pars defects. Unchanged moderate disc height loss at L3-L4 and severe disc height loss at L4-L5. The sacroiliac joints are unremarkable. Aortoiliac atherosclerotic vascular disease. IMPRESSION: 1. No acute osseous abnormality. Multiple chronic thoracolumbar compression deformities are unchanged. 2. Unchanged moderate L3-L4 and severe L4-L5 degenerative disc disease. Electronically Signed   By: Titus Dubin M.D.   On: 12/27/2018 17:00   Mr Jeri Cos ZO Contrast  Result Date: 12/27/2018 CLINICAL DATA:  History of prostate cancer and new confusion, uncontrollable jerking. EXAM: MRI HEAD WITHOUT AND WITH CONTRAST TECHNIQUE: Multiplanar, multiecho pulse sequences of the brain and surrounding structures were obtained without and with intravenous contrast. CONTRAST:  Gadavist 8 mL COMPARISON:  CT head 09/13/2018. MR head 09/19/2018. MR  brain 04/11/2018. CT head 04/11/2018. FINDINGS: There is significant motion degradation. The study is of borderline to marginal diagnostic utility. Brain: No restricted diffusion to suggest acute infarct. No hemorrhage, mass lesion, hydrocephalus, or extra-axial fluid. Advanced for age cerebral and cerebellar atrophy. Mild subcortical and periventricular T2 and FLAIR hyperintensities, likely chronic microvascular ischemic change. There is minimal symmetric pachymeningeal enhancement, doubtful significance. No intra-axial lesions are seen. Vascular: Flow voids are maintained. Skull and upper cervical spine: There is asymmetric thickness of the LEFT parietal calvarium is compared to the RIGHT. This appearance is stable from 2019, and metastatic disease is not established. Sinuses/Orbits: No significant sinus or orbital disease. Other: No visible mastoid fluid. IMPRESSION: Motion degraded exam demonstrating atrophy, and small vessel disease, but no definite acute intracranial findings. No visible metastatic disease to the brain or extradural space. Slight asymmetric thickening of the LEFT parietal calvarium appears stable from prior imaging studies, and is not clearly pathologic, despite patient's history of prostate cancer. Electronically Signed   By: Staci Righter M.D.   On: 12/27/2018 18:59    Assessment & Plan:   Mayford was seen today for anemia, hypertension and hyperlipidemia.  Diagnoses and all orders for this visit:  Hyperlipidemia with target LDL less than 100- His ASCVD risk score is less than 15% so I did not recommend a statin for CV risk reduction. -     Lipid panel; Future  Essential hypertension- His blood pressure is adequately well controlled. -     carvedilol (COREG) 3.125 MG tablet; Take 1 tablet (3.125 mg total) by mouth 2 (two) times daily with a meal. -     CBC with Differential/Platelet; Future  Hyponatremia- His sodium level is stable and he is asymptomatic with this. -      Basic metabolic panel; Future  Anemia, chronic disease- His H&H are normal.  Recent vitamin levels were all within normal limits.   I am having [place]. [place] maintain his CALCIUM-VITAMIN D PO, abiraterone acetate, Leuprolide Acetate (LUPRON IJ), azelastine, SYRINGE-NEEDLE (DISP) 3 ML, cyanocobalamin, Narcan,  tamsulosin, diclofenac sodium, predniSONE, budesonide-formoterol, meclizine, acetaminophen, Multiple Vitamins-Minerals (CENTRUM SILVER 50+MEN PO), b complex vitamins, loperamide, olmesartan, mometasone, ipratropium-albuterol, albuterol, albuterol, oxyCODONE, and carvedilol.  Meds ordered this encounter  Medications  . carvedilol (COREG) 3.125 MG tablet    Sig: Take 1 tablet (3.125 mg total) by mouth 2 (two) times daily with a meal.    Dispense:  180 tablet    Refill:  1     Follow-up: Return in about 6 months (around 10/16/2019).  Scarlette Calico, MD

## 2019-04-20 DIAGNOSIS — D638 Anemia in other chronic diseases classified elsewhere: Secondary | ICD-10-CM | POA: Insufficient documentation

## 2019-05-13 DIAGNOSIS — C7951 Secondary malignant neoplasm of bone: Secondary | ICD-10-CM | POA: Diagnosis not present

## 2019-05-13 DIAGNOSIS — C7952 Secondary malignant neoplasm of bone marrow: Secondary | ICD-10-CM | POA: Diagnosis not present

## 2019-05-18 ENCOUNTER — Other Ambulatory Visit: Payer: Self-pay | Admitting: Oncology

## 2019-05-18 MED ORDER — OXYCODONE HCL 5 MG PO TABS: 5.0000 mg | ORAL_TABLET | ORAL | 0 refills | Status: DC | PRN

## 2019-06-13 DIAGNOSIS — C7952 Secondary malignant neoplasm of bone marrow: Secondary | ICD-10-CM | POA: Diagnosis not present

## 2019-06-13 DIAGNOSIS — C7951 Secondary malignant neoplasm of bone: Secondary | ICD-10-CM | POA: Diagnosis not present

## 2019-06-16 ENCOUNTER — Other Ambulatory Visit: Payer: Self-pay | Admitting: Internal Medicine

## 2019-06-16 DIAGNOSIS — R42 Dizziness and giddiness: Secondary | ICD-10-CM

## 2019-07-10 ENCOUNTER — Other Ambulatory Visit: Payer: Self-pay | Admitting: Internal Medicine

## 2019-07-13 DIAGNOSIS — C7951 Secondary malignant neoplasm of bone: Secondary | ICD-10-CM | POA: Diagnosis not present

## 2019-07-13 DIAGNOSIS — C7952 Secondary malignant neoplasm of bone marrow: Secondary | ICD-10-CM | POA: Diagnosis not present

## 2019-07-14 ENCOUNTER — Other Ambulatory Visit: Payer: Self-pay | Admitting: Internal Medicine

## 2019-07-14 DIAGNOSIS — J4521 Mild intermittent asthma with (acute) exacerbation: Secondary | ICD-10-CM

## 2019-07-19 DIAGNOSIS — C61 Malignant neoplasm of prostate: Secondary | ICD-10-CM | POA: Diagnosis not present

## 2019-07-20 LAB — PSA: PSA: 0.3

## 2019-07-26 DIAGNOSIS — C678 Malignant neoplasm of overlapping sites of bladder: Secondary | ICD-10-CM | POA: Diagnosis not present

## 2019-07-26 DIAGNOSIS — C61 Malignant neoplasm of prostate: Secondary | ICD-10-CM | POA: Diagnosis not present

## 2019-07-26 DIAGNOSIS — C775 Secondary and unspecified malignant neoplasm of intrapelvic lymph nodes: Secondary | ICD-10-CM | POA: Diagnosis not present

## 2019-07-26 DIAGNOSIS — R351 Nocturia: Secondary | ICD-10-CM | POA: Diagnosis not present

## 2019-08-03 ENCOUNTER — Ambulatory Visit: Payer: Self-pay | Admitting: Internal Medicine

## 2019-08-03 ENCOUNTER — Telehealth: Payer: Self-pay | Admitting: Internal Medicine

## 2019-08-03 NOTE — Telephone Encounter (Signed)
Previous encounter:   Pt was instructed that if symptoms worsen or if he begins to have CP, SOB, DOE to go to ED immediately. Pt stated understanding.

## 2019-08-03 NOTE — Progress Notes (Signed)
Virtual Visit via Video Note  I connected with [name] Holder on 08/04/19 at  7:45 AM EST by a video enabled telemedicine application and verified that I am speaking with the correct person using two identifiers.   I discussed the limitations of evaluation and management by telemedicine and the availability of in person appointments. The patient expressed understanding and agreed to proceed.  Present for the visit:  Myself, Dr Billey Gosling, [name] Holder and his wife [name] Holder.  The patient is currently at home and I am in the office.    No referring provider.    History of Present Illness: This is an acute visit for elevated BP.   His BP has been elevated for the past week.  He was at the urologists office one week ago and his BP was elevated.  He has been checking his BP at home.  His wife is a Marine scientist and has been checking his blood pressure and his BP is consistently in 150/100 at home and his HR 70-80.  He is taking benicar 20 mg twice daily and Coreg 3.125 mg BID.  He has had some intermittent headaches and is unsure if there are sinus related or related to the elevated blood pressure.  He denies other concerning symptoms.   Review of Systems  Constitutional: Negative for fever.  Respiratory: Negative for shortness of breath.   Cardiovascular: Negative for chest pain, palpitations and leg swelling.  Neurological: Positive for headaches (intermittent). Negative for dizziness.      Social History   Socioeconomic History  . Marital status: Married    Spouse name: Not on file  . Number of children: Not on file  . Years of education: 77  . Highest education level: Not on file  Occupational History  . 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 Smoking status: Former Smoker    Packs/day: 0.50    Years: 25.00    Pack years: 12.50    Types: Cigarettes    Quit date: 09/08/2012    Years since quitting: 6.9  . Smokeless  tobacco: Never Used  Substance and Sexual Activity  . Alcohol use: Not Currently    Alcohol/week: 2.0 standard drinks    Types: 2 Glasses of wine per week  . Drug use: Never  . Sexual activity: Not on file  Other Topics Concern  . Not on file  Social History Narrative   Regular exercise-yes   Caffeine Use-yes   Social Determinants of Health   Financial Resource Strain:   . Difficulty of Paying Living Expenses: Not on file  Food Insecurity:   . Worried About Charity fundraiser in the Last Year: Not on file  . Ran Out of Food in the Last Year: Not on file  Transportation Needs:   . Lack of Transportation (Medical): Not on file  . Lack of Transportation (Non-Medical): Not on file  Physical Activity:   . Days of Exercise per Week: Not on file  . Minutes of Exercise per Session: Not on file  Stress:   . Feeling of Stress : Not on file  Social Connections:   . Frequency of Communication with Friends and Family: Not on file  . Frequency of Social Gatherings with Friends and Family: Not on file  . Attends Religious Services: Not on file  . Active Member of Clubs or Organizations: Not on file  . Attends Archivist Meetings: Not on  file  . Marital Status: Not on file     Observations/Objective: Appears well in NAD Breathing normally Skin appears warm and dry  Assessment and Plan:  See Problem List for Assessment and Plan of chronic medical problems.   Follow Up Instructions:    I discussed the assessment and treatment plan with the patient. The patient was provided an opportunity to ask questions and all were answered. The patient agreed with the plan and demonstrated an understanding of the instructions.   The patient was advised to call back or seek an in-person evaluation if the symptoms worsen or if the condition fails to improve as anticipated.    Binnie Rail, MD

## 2019-08-03 NOTE — Telephone Encounter (Signed)
Pt contacted. He stated that for the last week his BP has been elevated and he has had a HA. NO other symptoms. Pt was information that he should go to the ED for immediate evaluation. Pt declined. I have scheduled pt for virtual visit with Dr. Quay Burow.

## 2019-08-03 NOTE — Telephone Encounter (Signed)
We have no appointment open for the rest of the day. Does Dr.[name] want to double book?

## 2019-08-03 NOTE — Telephone Encounter (Signed)
Can we get pt in for an virtual today with one of our providers if there is availability.

## 2019-08-03 NOTE — Telephone Encounter (Signed)
Pt reports BP last week at urologist practice was 150/101. States has been monitoring at home since, BP running 150s/ low 100's. This AM ,154/94. No missed doses of BP meds.  States he took additional olmesartan yesterday and did not see much change in BP. States "Bottom number went to 90's."  Reports "Very slight headache."  Denies visual changes, no dizziness or weakness.   Pt has appt 08/08/2019; states this is only time he can get to practice due to transportation issues.   Questioning if PCP would consider medication adjustment until that appt.   States could do virtual appt this afternoon if needed.   Please advise: [phone]  Reason for Disposition . Systolic BP  >= 0000000 OR Diastolic >= [id number]  Answer Assessment - Initial Assessment Questions 1. BLOOD PRESSURE: "What is the blood pressure?" "Did you take at least two measurements 5 minutes apart?"     178/98 2. ONSET: "When did you take your blood pressure?"     This AM 3. HOW: "How did you obtain the blood pressure?" (e.g., visiting nurse, automatic home BP monitor)     Home monitor 4. HISTORY: "Do you have a history of high blood pressure?"     yes 5. MEDICATIONS: "Are you taking any medications for blood pressure?" "Have you missed any doses recently?"     Yes, no missed doses 6. OTHER SYMPTOMS: "Do you have any symptoms?" (e.g., headache, chest pain, blurred vision, difficulty breathing, weakness)     no  Protocols used: HIGH BLOOD PRESSURE-A-AH

## 2019-08-03 NOTE — Telephone Encounter (Addendum)
    Scheduler contacted patient to offer virtual visit for 08/04/19 (no availability for today). Patient requesting to speak with nurse regarding changing his blood pressure medication, before he will schedule virtual  appt for 08/04/19.

## 2019-08-04 ENCOUNTER — Encounter: Payer: Self-pay | Admitting: Internal Medicine

## 2019-08-04 ENCOUNTER — Ambulatory Visit (INDEPENDENT_AMBULATORY_CARE_PROVIDER_SITE_OTHER): Payer: Medicare Other | Admitting: Internal Medicine

## 2019-08-04 DIAGNOSIS — I10 Essential (primary) hypertension: Secondary | ICD-10-CM

## 2019-08-04 MED ORDER — CARVEDILOL 3.125 MG PO TABS: 6.2500 mg | ORAL_TABLET | Freq: Two times a day (BID) | ORAL | 1 refills | Status: DC

## 2019-08-04 MED ORDER — OLMESARTAN MEDOXOMIL 20 MG PO TABS: 20.0000 mg | ORAL_TABLET | Freq: Two times a day (BID) | ORAL | 1 refills | Status: DC

## 2019-08-04 NOTE — Assessment & Plan Note (Signed)
Chronic Currently not controlled Consistently 150s/100s at home with a heart rate 70-80s Has had some intermittent headaches that could be sinus related, but could be related to elevated pressure He is seen Dr. [name] early next week Increase carvedilol to 6.25 mg twice daily-they will double up on the pills they have at home Continue olmesartan 20 mg twice daily His wife will continue to monitor his BP and heart rate Call with any questions or concerns prior to coming in next week

## 2019-08-07 ENCOUNTER — Other Ambulatory Visit: Payer: Self-pay | Admitting: Internal Medicine

## 2019-08-07 DIAGNOSIS — J4520 Mild intermittent asthma, uncomplicated: Secondary | ICD-10-CM

## 2019-08-08 ENCOUNTER — Ambulatory Visit (INDEPENDENT_AMBULATORY_CARE_PROVIDER_SITE_OTHER): Payer: Medicare Other | Admitting: Internal Medicine

## 2019-08-08 ENCOUNTER — Other Ambulatory Visit: Payer: Self-pay

## 2019-08-08 ENCOUNTER — Encounter: Payer: Self-pay | Admitting: Internal Medicine

## 2019-08-08 ENCOUNTER — Ambulatory Visit (INDEPENDENT_AMBULATORY_CARE_PROVIDER_SITE_OTHER): Payer: Medicare Other

## 2019-08-08 VITALS — BP 160/84 | HR 78 | Temp 98.4°F | Resp 16 | Ht 67.0 in | Wt 218.0 lb

## 2019-08-08 DIAGNOSIS — R911 Solitary pulmonary nodule: Secondary | ICD-10-CM | POA: Diagnosis not present

## 2019-08-08 DIAGNOSIS — D638 Anemia in other chronic diseases classified elsewhere: Secondary | ICD-10-CM

## 2019-08-08 DIAGNOSIS — C7951 Secondary malignant neoplasm of bone: Secondary | ICD-10-CM

## 2019-08-08 DIAGNOSIS — I10 Essential (primary) hypertension: Secondary | ICD-10-CM

## 2019-08-08 DIAGNOSIS — R05 Cough: Secondary | ICD-10-CM

## 2019-08-08 DIAGNOSIS — C7952 Secondary malignant neoplasm of bone marrow: Secondary | ICD-10-CM

## 2019-08-08 DIAGNOSIS — R059 Cough, unspecified: Secondary | ICD-10-CM

## 2019-08-08 DIAGNOSIS — C679 Malignant neoplasm of bladder, unspecified: Secondary | ICD-10-CM

## 2019-08-08 LAB — BASIC METABOLIC PANEL
BUN: 15 mg/dL (ref 6–23)
CO2: 26 mEq/L (ref 19–32)
Calcium: 9.9 mg/dL (ref 8.4–10.5)
Chloride: 100 mEq/L (ref 96–112)
Creatinine, Ser: 0.71 mg/dL (ref 0.40–1.50)
GFR: 111.16 mL/min (ref >60.00)
Glucose, Bld: 109 mg/dL — ABNORMAL HIGH (ref 70–99)
Potassium: 4.2 mEq/L (ref 3.5–5.1)
Sodium: 134 mEq/L — ABNORMAL LOW (ref 135–145)

## 2019-08-08 LAB — CBC WITH DIFFERENTIAL/PLATELET
Basophils Absolute: 0 10*3/uL (ref 0.0–0.1)
Basophils Relative: 0.4 % (ref 0.0–3.0)
Eosinophils Absolute: 0.1 10*3/uL (ref 0.0–0.7)
Eosinophils Relative: 0.6 % (ref 0.0–5.0)
HCT: 39.2 % (ref 39.0–52.0)
Hemoglobin: 13.3 g/dL (ref 13.0–17.0)
Lymphocytes Relative: 18.1 % (ref 12.0–46.0)
Lymphs Abs: 1.6 10*3/uL (ref 0.7–4.0)
MCHC: 34 g/dL (ref 30.0–36.0)
MCV: 94.4 fl (ref 78.0–100.0)
Monocytes Absolute: 0.8 10*3/uL (ref 0.1–1.0)
Monocytes Relative: 8.5 % (ref 3.0–12.0)
Neutro Abs: 6.5 10*3/uL (ref 1.4–7.7)
Neutrophils Relative %: 72.4 % (ref 43.0–77.0)
Platelets: 235 10*3/uL (ref 150.0–400.0)
RBC: 4.15 Mil/uL — ABNORMAL LOW (ref 4.22–5.81)
RDW: 13.1 % (ref 11.5–15.5)
WBC: 9 10*3/uL (ref 4.0–10.5)

## 2019-08-08 LAB — POCT EXHALED NITRIC OXIDE: FeNO level (ppb): 8

## 2019-08-08 LAB — IBC PANEL
Iron: 114 ug/dL (ref 42–165)
Saturation Ratios: 34.2 % (ref 20.0–50.0)
Transferrin: 238 mg/dL (ref 212.0–360.0)

## 2019-08-08 LAB — FERRITIN: Ferritin: 198.5 ng/mL (ref 22.0–322.0)

## 2019-08-08 LAB — FOLATE: Folate: >24.1 ng/mL (ref >5.9)

## 2019-08-08 LAB — VITAMIN B12: Vitamin B-12: 823 pg/mL (ref 211–911)

## 2019-08-08 MED ORDER — INDAPAMIDE 1.25 MG PO TABS: 1.2500 mg | ORAL_TABLET | Freq: Every day | ORAL | 0 refills | Status: DC

## 2019-08-08 NOTE — Progress Notes (Signed)
Subjective:  Patient ID: [name] Holder, male    DOB: February 28, 1956  Age: 64 y.o. MRN: [id number]  CC: Cough and Hypertension   This visit occurred during the SARS-CoV-2 public health emergency.  Safety protocols were in place, including screening questions prior to the visit, additional usage of staff PPE, and extensive cleaning of exam room while observing appropriate contact time as indicated for disinfecting solutions.    HPI [name] Holder presents for f/up - He complains of a 56-monthhistory of persistent nonproductive cough and air hunger.  He is using multiple inhalers but says that nebulized albuterol helps the most.  He is also concerned that his blood pressure is not adequately well controlled.  He has been monitoring it closely over the last few months and the systolic is really below 1102  Outpatient Medications Prior to Visit  Medication Sig Dispense Refill  . abiraterone acetate (ZYTIGA) 250 MG tablet Take 1,000 mg by mouth daily.   10  . acetaminophen (TYLENOL) 325 MG tablet Take 325 mg by mouth daily as needed for fever.    .Marland Kitchenalbuterol (PROAIR HFA) 108 (90 Base) MCG/ACT inhaler 2 PUFFS EVERY 6 HOURS AS NEEDED FOR WHEEZING 8.5 g 5  . albuterol (PROVENTIL) (2.5 MG/3ML) 0.083% nebulizer solution NEBULIZE 1 VIAL EVERY 6-8 HOURS AS NEEDED FOR WHEEZING OR SHORTNESS OF BREATH 180 mL 3  . azelastine (ASTELIN) 0.1 % nasal spray Place 2 sprays into both nostrils every morning. (Patient taking differently: Place 2 sprays into both nostrils daily as needed for allergies. )    . b complex vitamins tablet Take 1 tablet by mouth daily.    .Marland KitchenCALCIUM-VITAMIN D PO Take 1 tablet by mouth 2 (two) times daily.     . carvedilol (COREG) 3.125 MG tablet Take 2 tablets (6.25 mg total) by mouth 2 (two) times daily with a meal.  1  . cyanocobalamin (,VITAMIN B-12,) 1000 MCG/ML injection Inject 1,000 mcg into the muscle every 30 (thirty) days.   4  . diclofenac sodium (VOLTAREN) 1 % GEL Apply 4 g  topically 4 (four) times daily.    .Marland Kitchenipratropium-albuterol (DUONEB) 0.5-2.5 (3) MG/3ML SOLN Take 3 mLs by nebulization every 4 (four) hours as needed. 360 mL 1  . Leuprolide Acetate (LUPRON IJ) Inject 1 Dose as directed every 4 (four) months. [facility]    . loperamide (IMODIUM) 2 MG capsule Take 2 capsules (4 mg total) by mouth every 6 (six) hours as needed for diarrhea or loose stools. 60 capsule 0  . meclizine (ANTIVERT) 12.5 MG tablet TAKE (1) TABLET THREE TIMES DAILY AS NEEDED FOR DIZZINESS. 270 tablet 0  . mometasone (NASONEX) 50 MCG/ACT nasal spray USE 2 SPRAYS IN EACH NOSTRIL ONCE DAILY. 51 g 0  . Multiple Vitamins-Minerals (CENTRUM SILVER 50+MEN PO) Take 1 tablet by mouth daily.    .Marland KitchenNARCAN 4 MG/0.1ML LIQD nasal spray kit Place 0.4 mg into the nose once.     .Marland Kitchenolmesartan (BENICAR) 20 MG tablet Take 1 tablet (20 mg total) by mouth 2 (two) times daily. 60 tablet 1  . oxyCODONE (OXY IR/ROXICODONE) 5 MG immediate release tablet Take 1 tablet (5 mg total) by mouth every 4 (four) hours as needed for severe pain. 180 tablet 0  . predniSONE (DELTASONE) 5 MG tablet Take 1 tablet (5 mg total) by mouth daily with breakfast. (Patient taking differently: Take 5 mg by mouth 2 (two) times daily with a meal. )    .  SYMBICORT 160-4.5 MCG/ACT inhaler Inhale 2 puffs into the lungs 2 (two) times daily. 30.6 g 3  . SYRINGE-NEEDLE, DISP, 3 ML 3 ML MISC Use to inject b12 monthly. 12 each 0  . tamsulosin (FLOMAX) 0.4 MG CAPS capsule Take 1 capsule (0.4 mg total) by mouth daily. 30 capsule    No facility-administered medications prior to visit.    ROS Review of Systems  Constitutional: Negative for chills, diaphoresis, fatigue and fever.  HENT: Negative.  Negative for sinus pressure, sore throat, trouble swallowing and voice change.   Eyes: Negative.   Respiratory: Positive for cough, shortness of breath and wheezing. Negative for chest tightness and stridor.   Cardiovascular:  Negative for chest pain, palpitations and leg swelling.  Gastrointestinal: Negative for abdominal pain, constipation, diarrhea, nausea and vomiting.  Endocrine: Negative.   Genitourinary: Negative.  Negative for difficulty urinating.  Musculoskeletal: Negative.  Negative for arthralgias and myalgias.  Skin: Negative.  Negative for color change and pallor.  Neurological: Negative.  Negative for dizziness, speech difficulty, weakness and light-headedness.  Hematological: Negative for adenopathy. Does not bruise/bleed easily.  Psychiatric/Behavioral: Negative.     Objective:  BP (!) 160/84 (BP Location: Left Arm, Patient Position: Sitting, Cuff Size: Large)   Pulse 78   Temp 98.4 F (36.9 C) (Oral)   Resp 16   Ht '5\' 7"'$  (1.702 m)   Wt 218 lb (98.9 kg)   SpO2 97%   BMI 34.14 kg/m   BP Readings from Last 3 Encounters:  08/08/19 (!) 160/84  04/18/19 (!) 142/88  04/14/19 (!) 148/84    Wt Readings from Last 3 Encounters:  08/08/19 218 lb (98.9 kg)  04/18/19 200 lb (90.7 kg)  04/14/19 197 lb (89.4 kg)    Physical Exam Vitals reviewed.  Constitutional:      General: He is not in acute distress.    Appearance: He is obese. He is not ill-appearing, toxic-appearing or diaphoretic.     Comments: He is in a wheelchair today  HENT:     Nose: Nose normal.     Mouth/Throat:     Mouth: Mucous membranes are moist.  Eyes:     General: No scleral icterus.    Conjunctiva/sclera: Conjunctivae normal.  Cardiovascular:     Rate and Rhythm: Normal rate and regular rhythm.     Heart sounds: No murmur.  Pulmonary:     Effort: Pulmonary effort is normal.     Breath sounds: No stridor. No wheezing, rhonchi or rales.  Abdominal:     General: Abdomen is protuberant. Bowel sounds are normal. There is no distension.     Palpations: There is no hepatomegaly or splenomegaly.     Tenderness: There is no abdominal tenderness.  Musculoskeletal:        General: Normal range of motion.     Cervical  back: Neck supple.     Right lower leg: No edema.     Left lower leg: No edema.  Lymphadenopathy:     Cervical: No cervical adenopathy.  Skin:    General: Skin is warm.  Neurological:     General: No focal deficit present.     Mental Status: He is alert and oriented to person, place, and time.  Psychiatric:        Mood and Affect: Mood normal.        Behavior: Behavior normal.     Lab Results  Component Value Date   WBC 9.0 08/08/2019   HGB 13.3 08/08/2019  HCT 39.2 08/08/2019   PLT 235.0 08/08/2019   GLUCOSE 109 (H) 08/08/2019   CHOL 174 04/18/2019   TRIG 115.0 04/18/2019   HDL 69.90 04/18/2019   LDLCALC 81 04/18/2019   ALT 17 04/14/2019   AST 15 04/14/2019   NA 134 (L) 08/08/2019   K 4.2 08/08/2019   CL 100 08/08/2019   CREATININE 0.71 08/08/2019   BUN 15 08/08/2019   CO2 26 08/08/2019   TSH 1.006 12/27/2018   PSA 0.3 07/20/2019   INR 0.94 08/26/2018   HGBA1C 5.1 11/25/2018    DG Lumbar Spine 2-3 Views  Result Date: 12/27/2018 CLINICAL DATA:  Acute on chronic low back pain. History of prior compression fractures status post kyphoplasty. EXAM: LUMBAR SPINE - 2-3 VIEW COMPARISON:  CT abdomen pelvis dated September 13, 2018. FINDINGS: Five lumbar type vertebral bodies. No acute fracture or subluxation. Unchanged chronic mild L3 central superior endplate compression fracture. Unchanged chronic T10, T12, L1, and L2 compression deformities status post cement augmentation. Unchanged trace retrolisthesis at L3-L4 and trace anterolisthesis at L5-S1. Unchanged bilateral L5 pars defects. Unchanged moderate disc height loss at L3-L4 and severe disc height loss at L4-L5. The sacroiliac joints are unremarkable. Aortoiliac atherosclerotic vascular disease. IMPRESSION: 1. No acute osseous abnormality. Multiple chronic thoracolumbar compression deformities are unchanged. 2. Unchanged moderate L3-L4 and severe L4-L5 degenerative disc disease. Electronically Signed   By: Titus Dubin M.D.    On: 12/27/2018 17:00   MR BRAIN W WO CONTRAST  Result Date: 12/27/2018 CLINICAL DATA:  History of prostate cancer and new confusion, uncontrollable jerking. EXAM: MRI HEAD WITHOUT AND WITH CONTRAST TECHNIQUE: Multiplanar, multiecho pulse sequences of the brain and surrounding structures were obtained without and with intravenous contrast. CONTRAST:  Gadavist 8 mL COMPARISON:  CT head 09/13/2018. MR head 09/19/2018. MR brain 04/11/2018. CT head 04/11/2018. FINDINGS: There is significant motion degradation. The study is of borderline to marginal diagnostic utility. Brain: No restricted diffusion to suggest acute infarct. No hemorrhage, mass lesion, hydrocephalus, or extra-axial fluid. Advanced for age cerebral and cerebellar atrophy. Mild subcortical and periventricular T2 and FLAIR hyperintensities, likely chronic microvascular ischemic change. There is minimal symmetric pachymeningeal enhancement, doubtful significance. No intra-axial lesions are seen. Vascular: Flow voids are maintained. Skull and upper cervical spine: There is asymmetric thickness of the LEFT parietal calvarium is compared to the RIGHT. This appearance is stable from 2019, and metastatic disease is not established. Sinuses/Orbits: No significant sinus or orbital disease. Other: No visible mastoid fluid. IMPRESSION: Motion degraded exam demonstrating atrophy, and small vessel disease, but no definite acute intracranial findings. No visible metastatic disease to the brain or extradural space. Slight asymmetric thickening of the LEFT parietal calvarium appears stable from prior imaging studies, and is not clearly pathologic, despite patient's history of prostate cancer. Electronically Signed   By: Staci Righter M.D.   On: 12/27/2018 18:59    DG Chest 2 View  Result Date: 08/09/2019 CLINICAL DATA:  Worsening dyspnea and cough. EXAM: CHEST - 2 VIEW COMPARISON:  08/10/2018 FINDINGS: The cardiac silhouette, mediastinal and hilar contours are  within normal limits and stable. There is moderate tortuosity of the thoracic aorta. Streaky subsegmental bibasilar atelectasis with a vague area of slight increased density in the right midlung laterally. Could not exclude a developing infiltrate. No pleural effusions. No worrisome pulmonary lesions. The bony thorax is intact. Stable thoracic and lumbar kyphoplasty changes. IMPRESSION: 1. Streaky bibasilar subsegmental atelectasis. 2. Vague area of increasing density in the right midlung could be  a developing infiltrate. Electronically Signed   By: Marijo Sanes M.D.   On: 08/09/2019 07:55    Assessment & Plan:   Darien was seen today for cough and hypertension.  Diagnoses and all orders for this visit:  Cough- His FeNO score is not elevated so I do not think his cough is consistent with an allergic or asthmatic phenomenon.  The cough is nonproductive so I do not think there is an infectious etiology.  His chest x-ray shows a new vague lesion on the right side.  I have asked him to undergo a CT of the chest with contrast to see if there is infection, malignancy, or bleeding. -     DG Chest 2 View; Future -     POCT EXHALED NITRIC OXIDE  Essential hypertension- His blood pressure is not adequately well controlled.  I recommended he add indapamide to his new regimen. -     Basic metabolic panel -     indapamide (LOZOL) 1.25 MG tablet; Take 1 tablet (1.25 mg total) by mouth daily.  Anemia, chronic disease- His H&H are normal now.  I will screen him for vitamin deficiencies. -     CBC with Differential -     IBC panel -     B12 -     Reticulocytes -     Folate -     Ferritin -     Vitamin B1  Lesion of right lung- see above -     CT Chest W Contrast; Future  Malignant neoplasm of urinary bladder, unspecified site [facility])- Will screen for metastatic disease with a CT of the chest. -     CT Chest W Contrast; Future  Secondary malignant neoplasm of bone and bone marrow ([place])- Will screen for  metastatic disease with a CT scan of the chest -     CT Chest W Contrast; Future   I am having Linward Headland. Turay start on indapamide. I am also having him maintain his CALCIUM-VITAMIN D PO, abiraterone acetate, Leuprolide Acetate (LUPRON IJ), azelastine, SYRINGE-NEEDLE (DISP) 3 ML, cyanocobalamin, Narcan, tamsulosin, diclofenac sodium, predniSONE, acetaminophen, Multiple Vitamins-Minerals (CENTRUM SILVER 50+MEN PO), b complex vitamins, loperamide, mometasone, ipratropium-albuterol, albuterol, oxyCODONE, meclizine, albuterol, olmesartan, carvedilol, and Symbicort.  Meds ordered this encounter  Medications  . indapamide (LOZOL) 1.25 MG tablet    Sig: Take 1 tablet (1.25 mg total) by mouth daily.    Dispense:  90 tablet    Refill:  0     Follow-up: Return in about 3 months (around 11/06/2019).  Scarlette Calico, MD

## 2019-08-08 NOTE — Patient Instructions (Signed)

## 2019-08-09 ENCOUNTER — Ambulatory Visit (INDEPENDENT_AMBULATORY_CARE_PROVIDER_SITE_OTHER)
Admission: RE | Admit: 2019-08-09 | Discharge: 2019-08-09 | Disposition: A | Discharge status: Home / Self Care | Payer: Medicare Other | Source: Ambulatory Visit | Attending: Internal Medicine | Admitting: Internal Medicine

## 2019-08-09 ENCOUNTER — Other Ambulatory Visit: Payer: Self-pay | Admitting: Internal Medicine

## 2019-08-09 ENCOUNTER — Encounter: Payer: Self-pay | Admitting: Internal Medicine

## 2019-08-09 DIAGNOSIS — C7952 Secondary malignant neoplasm of bone marrow: Secondary | ICD-10-CM | POA: Diagnosis not present

## 2019-08-09 DIAGNOSIS — R911 Solitary pulmonary nodule: Secondary | ICD-10-CM

## 2019-08-09 DIAGNOSIS — C7951 Secondary malignant neoplasm of bone: Secondary | ICD-10-CM | POA: Diagnosis not present

## 2019-08-09 DIAGNOSIS — C679 Malignant neoplasm of bladder, unspecified: Secondary | ICD-10-CM

## 2019-08-09 DIAGNOSIS — R918 Other nonspecific abnormal finding of lung field: Secondary | ICD-10-CM | POA: Diagnosis not present

## 2019-08-09 DIAGNOSIS — B59 Pneumocystosis: Secondary | ICD-10-CM | POA: Insufficient documentation

## 2019-08-09 MED ORDER — SULFAMETHOXAZOLE-TRIMETHOPRIM 800-160 MG PO TABS: 2.0000 | ORAL_TABLET | Freq: Three times a day (TID) | ORAL | 0 refills | Status: DC

## 2019-08-09 MED ORDER — IOHEXOL 300 MG/ML  SOLN
80.0000 mL | Freq: Once | INTRAMUSCULAR | Status: AC | PRN
  Administered 2019-08-09: 80 mL via INTRAVENOUS

## 2019-08-11 ENCOUNTER — Encounter: Payer: Self-pay | Admitting: Internal Medicine

## 2019-08-11 LAB — RETICULOCYTES
ABS Retic: 112320 cells/uL — ABNORMAL HIGH (ref 25000–9000)
Retic Ct Pct: 2.7 %

## 2019-08-11 LAB — VITAMIN B1: Vitamin B1 (Thiamine): 85 nmol/L — ABNORMAL HIGH (ref 8–30)

## 2019-08-13 DIAGNOSIS — C7952 Secondary malignant neoplasm of bone marrow: Secondary | ICD-10-CM | POA: Diagnosis not present

## 2019-08-13 DIAGNOSIS — C7951 Secondary malignant neoplasm of bone: Secondary | ICD-10-CM | POA: Diagnosis not present

## 2019-08-15 ENCOUNTER — Other Ambulatory Visit: Payer: Self-pay

## 2019-08-15 ENCOUNTER — Inpatient Hospital Stay (HOSPITAL_COMMUNITY)
Admission: EM | Admit: 2019-08-15 | Discharge: 2019-08-19 | DRG: 643 | Disposition: A | Discharge status: Home / Self Care | Payer: Medicare Other | Attending: Internal Medicine | Admitting: Internal Medicine

## 2019-08-15 ENCOUNTER — Emergency Department (HOSPITAL_COMMUNITY): Payer: Medicare Other

## 2019-08-15 ENCOUNTER — Encounter (HOSPITAL_COMMUNITY): Payer: Self-pay | Admitting: Emergency Medicine

## 2019-08-15 DIAGNOSIS — G9341 Metabolic encephalopathy: Secondary | ICD-10-CM | POA: Diagnosis not present

## 2019-08-15 DIAGNOSIS — Z79899 Other long term (current) drug therapy: Secondary | ICD-10-CM

## 2019-08-15 DIAGNOSIS — Z833 Family history of diabetes mellitus: Secondary | ICD-10-CM

## 2019-08-15 DIAGNOSIS — N400 Enlarged prostate without lower urinary tract symptoms: Secondary | ICD-10-CM | POA: Diagnosis present

## 2019-08-15 DIAGNOSIS — E222 Syndrome of inappropriate secretion of antidiuretic hormone: Principal | ICD-10-CM | POA: Diagnosis present

## 2019-08-15 DIAGNOSIS — C7951 Secondary malignant neoplasm of bone: Secondary | ICD-10-CM | POA: Diagnosis not present

## 2019-08-15 DIAGNOSIS — G893 Neoplasm related pain (acute) (chronic): Secondary | ICD-10-CM | POA: Diagnosis present

## 2019-08-15 DIAGNOSIS — J440 Chronic obstructive pulmonary disease with acute lower respiratory infection: Secondary | ICD-10-CM | POA: Diagnosis not present

## 2019-08-15 DIAGNOSIS — M4850XA Collapsed vertebra, not elsewhere classified, site unspecified, initial encounter for fracture: Secondary | ICD-10-CM | POA: Diagnosis present

## 2019-08-15 DIAGNOSIS — E871 Hypo-osmolality and hyponatremia: Secondary | ICD-10-CM | POA: Diagnosis present

## 2019-08-15 DIAGNOSIS — J4531 Mild persistent asthma with (acute) exacerbation: Secondary | ICD-10-CM | POA: Diagnosis present

## 2019-08-15 DIAGNOSIS — R918 Other nonspecific abnormal finding of lung field: Secondary | ICD-10-CM | POA: Diagnosis present

## 2019-08-15 DIAGNOSIS — R296 Repeated falls: Secondary | ICD-10-CM | POA: Diagnosis present

## 2019-08-15 DIAGNOSIS — M549 Dorsalgia, unspecified: Secondary | ICD-10-CM | POA: Diagnosis present

## 2019-08-15 DIAGNOSIS — R531 Weakness: Secondary | ICD-10-CM | POA: Diagnosis not present

## 2019-08-15 DIAGNOSIS — R443 Hallucinations, unspecified: Secondary | ICD-10-CM | POA: Diagnosis not present

## 2019-08-15 DIAGNOSIS — Z8719 Personal history of other diseases of the digestive system: Secondary | ICD-10-CM

## 2019-08-15 DIAGNOSIS — R200 Anesthesia of skin: Secondary | ICD-10-CM | POA: Diagnosis present

## 2019-08-15 DIAGNOSIS — G9340 Encephalopathy, unspecified: Secondary | ICD-10-CM | POA: Diagnosis not present

## 2019-08-15 DIAGNOSIS — Z823 Family history of stroke: Secondary | ICD-10-CM

## 2019-08-15 DIAGNOSIS — K5909 Other constipation: Secondary | ICD-10-CM | POA: Diagnosis present

## 2019-08-15 DIAGNOSIS — Z7951 Long term (current) use of inhaled steroids: Secondary | ICD-10-CM

## 2019-08-15 DIAGNOSIS — E872 Acidosis: Secondary | ICD-10-CM | POA: Diagnosis not present

## 2019-08-15 DIAGNOSIS — Z888 Allergy status to other drugs, medicaments and biological substances status: Secondary | ICD-10-CM | POA: Diagnosis not present

## 2019-08-15 DIAGNOSIS — I10 Essential (primary) hypertension: Secondary | ICD-10-CM | POA: Diagnosis not present

## 2019-08-15 DIAGNOSIS — M545 Low back pain: Secondary | ICD-10-CM | POA: Diagnosis present

## 2019-08-15 DIAGNOSIS — Z9079 Acquired absence of other genital organ(s): Secondary | ICD-10-CM | POA: Diagnosis not present

## 2019-08-15 DIAGNOSIS — R911 Solitary pulmonary nodule: Secondary | ICD-10-CM | POA: Diagnosis present

## 2019-08-15 DIAGNOSIS — T40605A Adverse effect of unspecified narcotics, initial encounter: Secondary | ICD-10-CM | POA: Diagnosis present

## 2019-08-15 DIAGNOSIS — T368X5A Adverse effect of other systemic antibiotics, initial encounter: Secondary | ICD-10-CM | POA: Diagnosis present

## 2019-08-15 DIAGNOSIS — J189 Pneumonia, unspecified organism: Secondary | ICD-10-CM | POA: Diagnosis present

## 2019-08-15 DIAGNOSIS — J9811 Atelectasis: Secondary | ICD-10-CM | POA: Diagnosis present

## 2019-08-15 DIAGNOSIS — E876 Hypokalemia: Secondary | ICD-10-CM | POA: Diagnosis present

## 2019-08-15 DIAGNOSIS — R9389 Abnormal findings on diagnostic imaging of other specified body structures: Secondary | ICD-10-CM

## 2019-08-15 DIAGNOSIS — M8450XA Pathological fracture in neoplastic disease, unspecified site, initial encounter for fracture: Secondary | ICD-10-CM | POA: Diagnosis not present

## 2019-08-15 DIAGNOSIS — N179 Acute kidney failure, unspecified: Secondary | ICD-10-CM | POA: Diagnosis present

## 2019-08-15 DIAGNOSIS — Z20822 Contact with and (suspected) exposure to covid-19: Secondary | ICD-10-CM | POA: Diagnosis present

## 2019-08-15 DIAGNOSIS — J441 Chronic obstructive pulmonary disease with (acute) exacerbation: Secondary | ICD-10-CM | POA: Diagnosis present

## 2019-08-15 DIAGNOSIS — Z8249 Family history of ischemic heart disease and other diseases of the circulatory system: Secondary | ICD-10-CM

## 2019-08-15 DIAGNOSIS — Z8042 Family history of malignant neoplasm of prostate: Secondary | ICD-10-CM

## 2019-08-15 DIAGNOSIS — C7952 Secondary malignant neoplasm of bone marrow: Secondary | ICD-10-CM | POA: Diagnosis present

## 2019-08-15 DIAGNOSIS — C61 Malignant neoplasm of prostate: Secondary | ICD-10-CM | POA: Diagnosis present

## 2019-08-15 DIAGNOSIS — M8580 Other specified disorders of bone density and structure, unspecified site: Secondary | ICD-10-CM | POA: Diagnosis present

## 2019-08-15 DIAGNOSIS — Z87891 Personal history of nicotine dependence: Secondary | ICD-10-CM

## 2019-08-15 DIAGNOSIS — Z8371 Family history of colonic polyps: Secondary | ICD-10-CM

## 2019-08-15 DIAGNOSIS — Z8261 Family history of arthritis: Secondary | ICD-10-CM

## 2019-08-15 DIAGNOSIS — E860 Dehydration: Secondary | ICD-10-CM | POA: Diagnosis not present

## 2019-08-15 DIAGNOSIS — R4182 Altered mental status, unspecified: Secondary | ICD-10-CM | POA: Diagnosis present

## 2019-08-15 DIAGNOSIS — R0602 Shortness of breath: Secondary | ICD-10-CM | POA: Diagnosis not present

## 2019-08-15 DIAGNOSIS — E785 Hyperlipidemia, unspecified: Secondary | ICD-10-CM | POA: Diagnosis present

## 2019-08-15 DIAGNOSIS — Z781 Physical restraint status: Secondary | ICD-10-CM

## 2019-08-15 DIAGNOSIS — Z7952 Long term (current) use of systemic steroids: Secondary | ICD-10-CM

## 2019-08-15 LAB — CBC WITH DIFFERENTIAL/PLATELET
Abs Immature Granulocytes: 0.05 10*3/uL (ref 0.00–0.07)
Basophils Absolute: 0 10*3/uL (ref 0.0–0.1)
Basophils Relative: 0 %
Eosinophils Absolute: 0.1 10*3/uL (ref 0.0–0.5)
Eosinophils Relative: 1 %
HCT: 35.4 % — ABNORMAL LOW (ref 39.0–52.0)
Hemoglobin: 12.8 g/dL — ABNORMAL LOW (ref 13.0–17.0)
Immature Granulocytes: 1 %
Lymphocytes Relative: 15 %
Lymphs Abs: 1.4 10*3/uL (ref 0.7–4.0)
MCH: 31.7 pg (ref 26.0–34.0)
MCHC: 36.2 g/dL — ABNORMAL HIGH (ref 30.0–36.0)
MCV: 87.6 fL (ref 80.0–100.0)
Monocytes Absolute: 1.2 10*3/uL — ABNORMAL HIGH (ref 0.1–1.0)
Monocytes Relative: 12 %
Neutro Abs: 6.7 10*3/uL (ref 1.7–7.7)
Neutrophils Relative %: 71 %
Platelets: 190 10*3/uL (ref 150–400)
RBC: 4.04 MIL/uL — ABNORMAL LOW (ref 4.22–5.81)
RDW: 12.4 % (ref 11.5–15.5)
WBC: 9.4 10*3/uL (ref 4.0–10.5)
nRBC: 0 % (ref 0.0–0.2)

## 2019-08-15 LAB — TROPONIN I (HIGH SENSITIVITY)
Troponin I (High Sensitivity): 3 ng/L (ref <18)
Troponin I (High Sensitivity): 3 ng/L (ref <18)

## 2019-08-15 LAB — URINALYSIS, ROUTINE W REFLEX MICROSCOPIC
Bacteria, UA: NONE SEEN
Bilirubin Urine: NEGATIVE
Glucose, UA: NEGATIVE mg/dL
Hgb urine dipstick: NEGATIVE
Ketones, ur: 5 mg/dL — AB
Leukocytes,Ua: NEGATIVE
Nitrite: NEGATIVE
Protein, ur: 30 mg/dL — AB
Specific Gravity, Urine: 1.024 (ref 1.005–1.030)
pH: 6 (ref 5.0–8.0)

## 2019-08-15 LAB — BASIC METABOLIC PANEL
Anion gap: 11 (ref 5–15)
BUN: 21 mg/dL (ref 8–23)
CO2: 20 mmol/L — ABNORMAL LOW (ref 22–32)
Calcium: 9.7 mg/dL (ref 8.9–10.3)
Chloride: 89 mmol/L — ABNORMAL LOW (ref 98–111)
Creatinine, Ser: 1.29 mg/dL — ABNORMAL HIGH (ref 0.61–1.24)
GFR calc Af Amer: >60 mL/min (ref >60)
GFR calc non Af Amer: 58 mL/min — ABNORMAL LOW (ref >60)
Glucose, Bld: 105 mg/dL — ABNORMAL HIGH (ref 70–99)
Potassium: 4 mmol/L (ref 3.5–5.1)
Sodium: 120 mmol/L — ABNORMAL LOW (ref 135–145)

## 2019-08-15 LAB — LACTIC ACID, PLASMA
Lactic Acid, Venous: 3.2 mmol/L (ref 0.5–1.9)
Lactic Acid, Venous: 1.5 mmol/L (ref 0.5–1.9)

## 2019-08-15 LAB — BRAIN NATRIURETIC PEPTIDE: B Natriuretic Peptide: 82.8 pg/mL (ref 0.0–100.0)

## 2019-08-15 LAB — RESPIRATORY PANEL BY RT PCR (FLU A&B, COVID)
Influenza A by PCR: NEGATIVE
Influenza B by PCR: NEGATIVE
SARS Coronavirus 2 by RT PCR: NEGATIVE

## 2019-08-15 LAB — CREATININE, URINE, RANDOM: Creatinine, Urine: 143.85 mg/dL

## 2019-08-15 LAB — SODIUM, URINE, RANDOM: Sodium, Ur: 164 mmol/L

## 2019-08-15 MED ORDER — ACETAMINOPHEN 650 MG RE SUPP: 650.0000 mg | Freq: Four times a day (QID) | RECTAL | Status: DC | PRN

## 2019-08-15 MED ORDER — OXYCODONE HCL 5 MG PO TABS
2.5000 mg | ORAL_TABLET | ORAL | Status: DC | PRN
  Administered 2019-08-16 – 2019-08-17 (×2): 2.5 mg via ORAL
  Filled 2019-08-15 (×2): qty 1

## 2019-08-15 MED ORDER — SODIUM CHLORIDE 0.9 % IV BOLUS
1000.0000 mL | Freq: Once | INTRAVENOUS | Status: AC
  Administered 2019-08-15: 1000 mL via INTRAVENOUS

## 2019-08-15 MED ORDER — SODIUM CHLORIDE 0.9 % IV SOLN
2.0000 g | INTRAVENOUS | Status: DC
  Administered 2019-08-15 – 2019-08-17 (×3): 2 g via INTRAVENOUS
  Filled 2019-08-15: qty 2
  Filled 2019-08-15 (×2): qty 20

## 2019-08-15 MED ORDER — IPRATROPIUM-ALBUTEROL 0.5-2.5 (3) MG/3ML IN SOLN
3.0000 mL | Freq: Four times a day (QID) | RESPIRATORY_TRACT | Status: DC
  Administered 2019-08-15 – 2019-08-18 (×12): 3 mL via RESPIRATORY_TRACT
  Filled 2019-08-15 (×12): qty 3

## 2019-08-15 MED ORDER — CARVEDILOL 6.25 MG PO TABS
6.2500 mg | ORAL_TABLET | Freq: Two times a day (BID) | ORAL | Status: DC
  Administered 2019-08-16: 6.25 mg via ORAL
  Filled 2019-08-15: qty 1

## 2019-08-15 MED ORDER — SODIUM CHLORIDE 0.9 % IV SOLN: INTRAVENOUS | Status: DC

## 2019-08-15 MED ORDER — ENOXAPARIN SODIUM 40 MG/0.4ML ~~LOC~~ SOLN
40.0000 mg | SUBCUTANEOUS | Status: DC
  Administered 2019-08-15 – 2019-08-18 (×4): 40 mg via SUBCUTANEOUS
  Filled 2019-08-15 (×4): qty 0.4

## 2019-08-15 MED ORDER — PREDNISONE 5 MG PO TABS
5.0000 mg | ORAL_TABLET | Freq: Two times a day (BID) | ORAL | Status: DC
  Administered 2019-08-16 – 2019-08-17 (×2): 5 mg via ORAL
  Filled 2019-08-15 (×3): qty 1

## 2019-08-15 MED ORDER — FLUTICASONE PROPIONATE 50 MCG/ACT NA SUSP
2.0000 | Freq: Every day | NASAL | Status: DC
  Administered 2019-08-17 – 2019-08-19 (×3): 2 via NASAL
  Filled 2019-08-15: qty 16

## 2019-08-15 MED ORDER — SODIUM CHLORIDE 0.9% FLUSH
3.0000 mL | Freq: Two times a day (BID) | INTRAVENOUS | Status: DC
  Administered 2019-08-15 – 2019-08-19 (×5): 3 mL via INTRAVENOUS

## 2019-08-15 MED ORDER — SODIUM CHLORIDE 0.9 % IV SOLN
500.0000 mg | INTRAVENOUS | Status: DC
  Administered 2019-08-15 – 2019-08-17 (×2): 500 mg via INTRAVENOUS
  Filled 2019-08-15 (×2): qty 500

## 2019-08-15 MED ORDER — ONDANSETRON HCL 4 MG/2ML IJ SOLN: 4.0000 mg | Freq: Four times a day (QID) | INTRAMUSCULAR | Status: DC | PRN

## 2019-08-15 MED ORDER — ONDANSETRON HCL 4 MG PO TABS: 4.0000 mg | ORAL_TABLET | Freq: Four times a day (QID) | ORAL | Status: DC | PRN

## 2019-08-15 MED ORDER — ALBUTEROL SULFATE (2.5 MG/3ML) 0.083% IN NEBU
3.0000 mL | INHALATION_SOLUTION | Freq: Four times a day (QID) | RESPIRATORY_TRACT | Status: DC | PRN
  Filled 2019-08-15: qty 3

## 2019-08-15 MED ORDER — SODIUM CHLORIDE 0.9 % IV SOLN: Freq: Once | INTRAVENOUS | Status: AC

## 2019-08-15 MED ORDER — ACETAMINOPHEN 325 MG PO TABS
650.0000 mg | ORAL_TABLET | Freq: Four times a day (QID) | ORAL | Status: DC | PRN
  Administered 2019-08-17 – 2019-08-19 (×2): 650 mg via ORAL
  Filled 2019-08-15 (×2): qty 2

## 2019-08-15 MED ORDER — ALBUTEROL SULFATE (2.5 MG/3ML) 0.083% IN NEBU
2.5000 mg | INHALATION_SOLUTION | Freq: Four times a day (QID) | RESPIRATORY_TRACT | Status: DC | PRN
  Administered 2019-08-17 – 2019-08-19 (×3): 2.5 mg via RESPIRATORY_TRACT
  Filled 2019-08-15 (×2): qty 3

## 2019-08-15 MED ORDER — ABIRATERONE ACETATE 250 MG PO TABS: 1000.0000 mg | ORAL_TABLET | Freq: Every day | ORAL | Status: DC

## 2019-08-15 MED ORDER — DOCUSATE SODIUM 100 MG PO CAPS
100.0000 mg | ORAL_CAPSULE | Freq: Two times a day (BID) | ORAL | Status: DC
  Administered 2019-08-15 – 2019-08-19 (×7): 100 mg via ORAL
  Filled 2019-08-15 (×7): qty 1

## 2019-08-15 MED ORDER — DARIFENACIN HYDROBROMIDE ER 7.5 MG PO TB24
7.5000 mg | ORAL_TABLET | Freq: Every day | ORAL | Status: DC
  Administered 2019-08-17 – 2019-08-19 (×3): 7.5 mg via ORAL
  Filled 2019-08-15 (×4): qty 1

## 2019-08-15 MED ORDER — MOMETASONE FURO-FORMOTEROL FUM 200-5 MCG/ACT IN AERO
2.0000 | INHALATION_SPRAY | Freq: Two times a day (BID) | RESPIRATORY_TRACT | Status: DC
  Administered 2019-08-16 – 2019-08-19 (×7): 2 via RESPIRATORY_TRACT
  Filled 2019-08-15: qty 8.8

## 2019-08-15 NOTE — ED Notes (Signed)
This RN walked into the room and found this patient at the edge of the bed on the ground. Pt stated he did not fall, patient kneeled to the ground when he felt lightheaded and his knees were weak. Pt assisted back to bed and hooked back up to monitor. Vitals are WNL, NAD.

## 2019-08-15 NOTE — ED Provider Notes (Signed)
[facility] DEPT Provider Note   CSN: [id number] Arrival date & time: 08/15/19  1122     History Chief Complaint  Patient presents with  . Hallucinations    [name] Holder is a 64 y.o. male.  Presented to the ER with complaint of hallucinations.  Notable past medical history of metastatic prostate cancer.  Recent visit to PCP with concern for possible pneumonia, started on Bactrim.  States over the last few days he has been having daily episodes of hallucinations.  States has been taking fairly large quantity of opiate medicines for his chronic pain related to his cancer.  None he denies any new medicines besides the Bactrim.  No chest pain, no abdominal pain.  Denies any fevers or chills.  No associated numbness, weakness, vision changes, difficulty walking.  Hallucinations are visual, no auditory, no command.  HPI     Past Medical History:  Diagnosis Date  . Allergic rhinitis   . At risk for sleep apnea    STOP-BANG SCORE = 5    (routed to pt's pcp 09-08-2018)  . Chronic constipation   . Chronic low back pain   . Chronic pain   . DOE (dyspnea on exertion)   . History of kidney stones   . History of recent pneumonia 08/10/2018   acute bronchopneumonia;  follow-up cxr 08-28-2018 in epic  . History of vertebral compression fracture    T10 and T12 s/p kyphoplasty  . Hx of colonic polyps   . Hyperplasia of prostate with lower urinary tract symptoms (LUTS)   . Hypertension   . Mild persistent asthma    followed by pcp  . Numbness in both legs    secondary to a fall, per pt has had work-up and couldn't find anything  . Osteopenia   . Prostate cancer metastatic to pelvis [facility]) urologist-- dr borden/  oncologist -- dr Alen Blew   dx 2014 advanced prostate cancer with pelvid adenopathy,  Stage T2c, Gleason 7;  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  . Wears glasses     Patient Active Problem List   Diagnosis Date Noted  . Lesion of right lung 08/09/2019  .  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 Anemia, chronic disease 04/20/2019  . Hyperglycemia 11/24/2018  . Pathologic compression fracture of spine, initial encounter ([place]) 09/13/2018  . Cancer associated pain 09/13/2018  . Bladder cancer ([place]) 09/13/2018  . Secondary malignant neoplasm of bone and bone marrow ([place]) 08/06/2018  . Spinal stenosis in cervical region 04/12/2018  . Routine general medical examination at a health care facility 08/29/2015  . Hyperlipidemia with target LDL less than 100 12/21/2013  . Essential hypertension 05/20/2010  . Allergic rhinitis 05/20/2010  . Asthma, mild intermittent 05/20/2010    Past Surgical History:  Procedure Laterality Date  . COLONOSCOPY    . IR KYPHO EA ADDL LEVEL THORACIC OR LUMBAR  08/17/2018  . IR KYPHO EA ADDL LEVEL THORACIC OR LUMBAR  08/26/2018  . IR KYPHO LUMBAR INC FX REDUCE BONE BX UNI/BIL CANNULATION INC/IMAGING  08/26/2018  . IR KYPHO THORACIC WITH BONE BIOPSY  08/17/2018  . IR RADIOLOGIST EVAL & MGMT  08/11/2018  . IR RADIOLOGIST EVAL & MGMT  08/24/2018  . LYMPHADENECTOMY Left 01/05/2013   Procedure: ROBOTIC LYMPHADENECTOMY;  Surgeon: Dutch Gray, MD;  Location: [facility];  Service: Urology;  Laterality: Left;  . PROSTATE BIOPSY     11/12 positive biopsies  . ROTATOR CUFF REPAIR Left 2012  .  TRANSURETHRAL RESECTION OF BLADDER TUMOR N/A 09/15/2018   Procedure: TRANSURETHRAL RESECTION OF BLADDER TUMOR (TURBT)WITH CYSTOSCOPY/ POST OPERATIVE INSTILLATION OF GEMCITABINE;  Surgeon: Raynelle Bring, MD;  Location: [facility];  Service: Urology;  Laterality: N/A;  GENERAL ANESTHESIA WITH PARALYSIS  . UMBILICAL HERNIA REPAIR  09-02-2001   dr t. price '@[facility]'$        Family History  Problem Relation Age of Onset  . Arthritis Mother   . Diabetes Mother   . Hypertension Mother   . Hyperlipidemia Mother   . Hypertension Father   . Prostate cancer Father        radiation in mid 25s  . Heart attack Father   . Cancer  Sister        precancerous colon polyps, 6 in colon removed  . Stroke Maternal Grandfather     Social History   Tobacco Use  . Smoking status: Former Smoker    Packs/day: 0.50    Years: 25.00    Pack years: 12.50    Types: Cigarettes    Quit date: 09/08/2012    Years since quitting: 6.9  . Smokeless tobacco: Never Used  Substance Use Topics  . Alcohol use: Not Currently    Alcohol/week: 2.0 standard drinks    Types: 2 Glasses of wine per week  . Drug use: Never    Home Medications Prior to Admission medications   Medication Sig Start Date End Date Taking? Authorizing Provider  abiraterone acetate (ZYTIGA) 250 MG tablet Take 1,000 mg by mouth daily.  08/17/17  Yes [provider]  acetaminophen (TYLENOL) 325 MG tablet Take 325 mg by mouth daily as needed for fever.   Yes [provider]  albuterol (PROAIR HFA) 108 (90 Base) MCG/ACT inhaler 2 PUFFS EVERY 6 HOURS AS NEEDED FOR WHEEZING Patient taking differently: Inhale 2 puffs into the lungs every 6 (six) hours as needed for wheezing. 2 PUFFS EVERY 6 HOURS AS NEEDED FOR WHEEZING 04/13/19  Yes [name], MD  albuterol (PROVENTIL) (2.5 MG/3ML) 0.083% nebulizer solution NEBULIZE 1 VIAL EVERY 6-8 HOURS AS NEEDED FOR WHEEZING OR SHORTNESS OF BREATH Patient taking differently: Take 2.5 mg by nebulization every 6 (six) hours as needed for wheezing or shortness of breath. NEBULIZE 1 VIAL EVERY 6-8 HOURS AS NEEDED FOR WHEEZING OR SHORTNESS OF BREATH 07/15/19  Yes [name], MD  azelastine (ASTELIN) 0.1 % nasal spray Place 2 sprays into both nostrils every morning. Patient taking differently: Place 2 sprays into both nostrils daily as needed for allergies.  04/12/18  Yes Johnson, Clanford L, MD  b complex vitamins tablet Take 1 tablet by mouth daily.   Yes [provider]  CALCIUM-VITAMIN D PO Take 1 tablet by mouth 2 (two) times daily.    Yes [provider]  carvedilol (COREG) 3.125 MG tablet Take  2 tablets (6.25 mg total) by mouth 2 (two) times daily with a meal. 08/04/19  Yes Burns, Claudina Lick, MD  indapamide (LOZOL) 1.25 MG tablet Take 1 tablet (1.25 mg total) by mouth daily. 08/08/19  Yes [name], MD  ipratropium-albuterol (DUONEB) 0.5-2.5 (3) MG/3ML SOLN Take 3 mLs by nebulization every 4 (four) hours as needed. Patient taking differently: Take 3 mLs by nebulization every 4 (four) hours as needed (wheezing/SOB).  04/13/19  Yes [name], MD  Leuprolide Acetate (LUPRON IJ) Inject 1 Dose as directed every 4 (four) months. This is administered by [facility]   Yes [provider]  mometasone (NASONEX) 50  MCG/ACT nasal spray USE 2 SPRAYS IN EACH NOSTRIL ONCE DAILY. Patient taking differently: Place 2 sprays into the nose daily.  03/07/19  Yes [name], MD  Multiple Vitamins-Minerals (CENTRUM SILVER 50+MEN PO) Take 1 tablet by mouth daily.   Yes [provider]  NARCAN 4 MG/0.1ML LIQD nasal spray kit Place 0.4 mg into the nose once as needed (overdose).  08/18/18  Yes [provider]  olmesartan (BENICAR) 20 MG tablet Take 1 tablet (20 mg total) by mouth 2 (two) times daily. 08/04/19  Yes Burns, Claudina Lick, MD  oxyCODONE (OXY IR/ROXICODONE) 5 MG immediate release tablet Take 1 tablet (5 mg total) by mouth every 4 (four) hours as needed for severe pain. Patient taking differently: Take 2.5 mg by mouth every 4 (four) hours as needed for severe pain.  05/18/19  Yes Wyatt Portela, MD  predniSONE (DELTASONE) 5 MG tablet Take 1 tablet (5 mg total) by mouth daily with breakfast. Patient taking differently: Take 5 mg by mouth 2 (two) times daily with a meal.  09/21/18  Yes Dahal, Marlowe Aschoff, MD  solifenacin (VESICARE) 5 MG tablet Take 5 mg by mouth daily. 08/11/19  Yes [provider]  sulfamethoxazole-trimethoprim (BACTRIM DS) 800-160 MG tablet Take 2 tablets by mouth every 8 (eight) hours for 21 days. 08/09/19 08/30/19 Yes [name], MD  SYMBICORT  160-4.5 MCG/ACT inhaler Inhale 2 puffs into the lungs 2 (two) times daily. Patient taking differently: Inhale 2 puffs into the lungs 2 (two) times daily.  08/07/19  Yes [name], MD  diclofenac sodium (VOLTAREN) 1 % GEL Apply 4 g topically 4 (four) times daily. Patient not taking: Reported on 08/15/2019 09/21/18   Terrilee Croak, MD  loperamide (IMODIUM) 2 MG capsule Take 2 capsules (4 mg total) by mouth every 6 (six) hours as needed for diarrhea or loose stools. Patient not taking: Reported on 08/15/2019 12/30/18   Nita Sells, MD  meclizine (ANTIVERT) 12.5 MG tablet TAKE (1) TABLET THREE TIMES DAILY AS NEEDED FOR DIZZINESS. Patient not taking: Reported on 08/15/2019 06/16/19   [name], MD  SYRINGE-NEEDLE, DISP, 3 ML 3 ML MISC Use to inject b12 monthly. 04/19/18   [name], MD  tamsulosin (FLOMAX) 0.4 MG CAPS capsule Take 1 capsule (0.4 mg total) by mouth daily. Patient not taking: Reported on 08/15/2019 09/22/18   Terrilee Croak, MD    Allergies    Amlodipine and Tetracycline  Review of Systems   Review of Systems  Constitutional: Negative for chills and fever.  HENT: Negative for ear pain and sore throat.   Eyes: Negative for pain and visual disturbance.  Respiratory: Negative for cough and shortness of breath.   Cardiovascular: Negative for chest pain and palpitations.  Gastrointestinal: Negative for abdominal pain and vomiting.  Genitourinary: Negative for dysuria and hematuria.  Musculoskeletal: Negative for arthralgias and back pain.  Skin: Negative for color change and rash.  Neurological: Negative for seizures and syncope.  Psychiatric/Behavioral: Positive for hallucinations.  All other systems reviewed and are negative.   Physical Exam Updated Vital Signs BP (!) 99/58   Pulse 76   Temp 98.1 F (36.7 C) (Oral)   Resp 19   SpO2 95%   Physical Exam Vitals and nursing note reviewed.  Constitutional:      Appearance: He is well-developed.  HENT:      Head: Normocephalic and atraumatic.  Eyes:     Conjunctiva/sclera: Conjunctivae normal.  Cardiovascular:     Rate  and Rhythm: Normal rate and regular rhythm.     Heart sounds: No murmur.  Pulmonary:     Effort: Pulmonary effort is normal. No respiratory distress.     Breath sounds: Normal breath sounds.  Abdominal:     Palpations: Abdomen is soft.     Tenderness: There is no abdominal tenderness.  Musculoskeletal:     Cervical back: Neck supple.  Skin:    General: Skin is warm and dry.  Neurological:     General: No focal deficit present.     Mental Status: He is alert and oriented to person, place, and time.     Cranial Nerves: No cranial nerve deficit.     Sensory: No sensory deficit.     Motor: No weakness.     Coordination: Coordination normal.     Gait: Gait normal.  Psychiatric:        Mood and Affect: Mood normal.        Behavior: Behavior normal.        Thought Content: Thought content normal.        Judgment: Judgment normal.     ED Results / Procedures / Treatments   Labs (all labs ordered are listed, but only abnormal results are displayed) Labs Reviewed  CBC WITH DIFFERENTIAL/PLATELET - Abnormal; Notable for the following components:      Result Value   RBC 4.04 (*)    Hemoglobin 12.8 (*)    HCT 35.4 (*)    MCHC 36.2 (*)    Monocytes Absolute 1.2 (*)    All other components within normal limits  BASIC METABOLIC PANEL  LACTIC ACID, PLASMA  LACTIC ACID, PLASMA  BRAIN NATRIURETIC PEPTIDE  URINALYSIS, ROUTINE W REFLEX MICROSCOPIC  TROPONIN I (HIGH SENSITIVITY)    EKG EKG Interpretation  Date/Time:  Tuesday August 15 2019 11:53:52 EST Ventricular Rate:  81 PR Interval:    QRS Duration: 97 QT Interval:  378 QTC Calculation: 439 R Axis:   52 Text Interpretation: Sinus rhythm Probable left atrial enlargement Baseline wander in lead(s) V3 Confirmed by Madalyn Rob [phone]) on 08/15/2019 12:21:12 PM   Radiology No results  found.  Procedures Procedures (including critical care time)  Medications Ordered in ED Medications  sodium chloride 0.9 % bolus 1,000 mL (1,000 mLs Intravenous New Bag/Given 08/15/19 1232)    ED Course  I have reviewed the triage vital signs and the nursing notes.  Pertinent labs & imaging results that were available during my care of the patient were reviewed by me and considered in my medical decision making (see chart for details).  Clinical Course as of Aug 14 1234  Tue Aug 15, 2019  1236 Completed initial assessment, no neuro deficits, well-appearing   [RD]    Clinical Course User Index [RD] Lucrezia Starch, MD   MDM Rules/Calculators/A&P                      64 year old male metastatic answer presenting to ER with complaint of generalized weakness, hallucinations.  On exam he is well-appearing, no focal neurologic deficits noted.  CT head negative.  Work-up was concerning for hyponatremia, AKI.  Suspect this is causing his symptoms today.  Had been on Bactrim for possible pneumonia outpatient, this may be contributing to his AKI today.  Appears dehydrated, will give fluid bolus and started on maintenance fluids for rehydration and correction of his hyponatremia.  Will admit to hospitalist service for further management.  Dr. Lorin Mercy will accept.  Final Clinical Impression(s) / ED Diagnoses Final diagnoses:  Hallucinations  Hyponatremia    Rx / DC Orders ED Discharge Orders    None       Lucrezia Starch, MD 08/15/19 [phone]

## 2019-08-15 NOTE — H&P (Signed)
History and Physical    [name] STM:[id number] DOB: 10-03-1955 DOA: 08/15/2019  PCP: [name], MD Consultants:  Alen Blew - oncology; Tammi Klippel - rad onc; Alinda Money - urology Patient coming from:  Home - lives with ;wife NOK: Wife, Hazim Treadway, [phone]  Chief Complaint: hallucinations  HPI: [name] Holder is a 64 y.o. male with medical history significant of prostate CA that is metastatic to his pelvis; HTN; BPH; and chronic back pain presenting with hallucinations.  He was last seen via virtual visit on 1/8 for HTN with medication changes.  He went for an in-person visit on 1/12 for cough.  CXR showed possible developing R midlung infiltrate and he was started on an antibiotic (Bactrim) and scheduled for a chest CT for further evaluation.  He reports that he started feeling poorly, sleeping all the time, terrible back pain.  He hasn't had much sleep in the last 24 hours.  I called and spoke with his wife.  She reports that he has been very confused.  He was diagnosed by CT with "probable" pneumonia.  He has appeared SOB over a couple of weeks.  Had a virtual visit and in-person visit.  He had a CXR and CT - concern for infection, started Septra.  He has been using his rescue inhaler a lot and alternates Duonebs with Albuterol q4h, increasing nebs.  He sounded a little better, congestion breaking up.  Last night, he was confused, disoriented, diaphoretic, very air hungry.  No better this AM.   ED Course:  Weakness, hallucinations.  PCP had been treating for possible PNA (soft call), placed on Bactrim.  AKI, worsening hyponatremia, lactate 3.2.  Feels lethargic without confusion.  Given 1L IVF and started on MIVF.  No antibiotics.  Review of Systems: As per HPI; otherwise review of systems reviewed and negative.   Ambulatory Status:  Ambulates with a walker  Past Medical History:  Diagnosis Date  . Allergic rhinitis   . At risk for sleep apnea    STOP-BANG SCORE = 5     (routed to pt's pcp 09-08-2018)  . Chronic constipation   . Chronic low back pain   . Chronic pain   . DOE (dyspnea on exertion)   . History of kidney stones   . History of recent pneumonia 08/10/2018   acute bronchopneumonia;  follow-up cxr 08-28-2018 in epic  . History of vertebral compression fracture    T10 and T12 s/p kyphoplasty  . Hx of colonic polyps   . Hyperplasia of prostate with lower urinary tract symptoms (LUTS)   . Hypertension   . Mild persistent asthma    followed by pcp  . Numbness in both legs    secondary to a fall, per pt has had work-up and couldn't find anything  . Osteopenia   . Prostate cancer metastatic to pelvis [facility]) urologist-- dr borden/  oncologist -- dr Alen Blew   dx 2014 advanced prostate cancer with pelvid adenopathy,  Stage T2c, Gleason 7;  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  . Wears glasses     Past Surgical History:  Procedure Laterality Date  . COLONOSCOPY    . IR KYPHO EA ADDL LEVEL THORACIC OR LUMBAR  08/17/2018  . IR KYPHO EA ADDL LEVEL THORACIC OR LUMBAR  08/26/2018  . IR KYPHO LUMBAR INC FX REDUCE BONE BX UNI/BIL CANNULATION INC/IMAGING  08/26/2018  . IR KYPHO THORACIC WITH BONE BIOPSY  08/17/2018  . IR RADIOLOGIST EVAL & MGMT  08/11/2018  . IR RADIOLOGIST  EVAL & MGMT  08/24/2018  . LYMPHADENECTOMY Left 01/05/2013   Procedure: ROBOTIC LYMPHADENECTOMY;  Surgeon: Dutch Gray, MD;  Location: [facility];  Service: Urology;  Laterality: Left;  . PROSTATE BIOPSY     11/12 positive biopsies  . ROTATOR CUFF REPAIR Left 2012  . TRANSURETHRAL RESECTION OF BLADDER TUMOR N/A 09/15/2018   Procedure: TRANSURETHRAL RESECTION OF BLADDER TUMOR (TURBT)WITH CYSTOSCOPY/ POST OPERATIVE INSTILLATION OF GEMCITABINE;  Surgeon: Raynelle Bring, MD;  Location: [facility];  Service: Urology;  Laterality: N/A;  GENERAL ANESTHESIA WITH PARALYSIS  . UMBILICAL HERNIA REPAIR  09-02-2001   dr t. price '@[facility]'$     Social History   Socioeconomic History  . Marital status: Married    Spouse name:  Not on file  . Number of children: Not on file  . Years of education: 96  . Highest education level: Not on file  Occupational History  . 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 Smoking status: Former Smoker    Packs/day: 0.50    Years: 25.00    Pack years: 12.50    Types: Cigarettes    Quit date: 09/08/2012    Years since quitting: 6.9  . Smokeless tobacco: Never Used  Substance and Sexual Activity  . Alcohol use: Not Currently    Alcohol/week: 2.0 standard drinks    Types: 2 Glasses of wine per week  . Drug use: Never  . Sexual activity: Not on file  Other Topics Concern  . Not on file  Social History Narrative   Regular exercise-yes   Caffeine Use-yes   Social Determinants of Health   Financial Resource Strain:   . Difficulty of Paying Living Expenses: Not on file  Food Insecurity:   . Worried About Charity fundraiser in the Last Year: Not on file  . Ran Out of Food in the Last Year: Not on file  Transportation Needs:   . Lack of Transportation (Medical): Not on file  . Lack of Transportation (Non-Medical): Not on file  Physical Activity:   . Days of Exercise per Week: Not on file  . Minutes of Exercise per Session: Not on file  Stress:   . Feeling of Stress : Not on file  Social Connections:   . Frequency of Communication with Friends and Family: Not on file  . Frequency of Social Gatherings with Friends and Family: Not on file  . Attends Religious Services: Not on file  . Active Member of Clubs or Organizations: Not on file  . Attends Archivist Meetings: Not on file  . Marital Status: Not on file  Intimate Partner Violence:   . Fear of Current or Ex-Partner: Not on file  . Emotionally Abused: Not on file  . Physically Abused: Not on file  . Sexually Abused: Not on file    Allergies  Allergen Reactions  . Amlodipine Swelling    edema  . Tetracycline Rash    Family History  Problem  Relation Age of Onset  . Arthritis Mother   . Diabetes Mother   . Hypertension Mother   . Hyperlipidemia Mother   . Hypertension Father   . Prostate cancer Father        radiation in mid 28s  . Heart attack Father   . Cancer Sister        precancerous colon polyps, 6 in colon removed  . Stroke Maternal Grandfather     Prior to Admission  medications   Medication Sig Start Date End Date Taking? Authorizing Provider  abiraterone acetate (ZYTIGA) 250 MG tablet Take 1,000 mg by mouth daily.  08/17/17  Yes [provider]  acetaminophen (TYLENOL) 325 MG tablet Take 325 mg by mouth daily as needed for fever.   Yes [provider]  albuterol (PROAIR HFA) 108 (90 Base) MCG/ACT inhaler 2 PUFFS EVERY 6 HOURS AS NEEDED FOR WHEEZING Patient taking differently: Inhale 2 puffs into the lungs every 6 (six) hours as needed for wheezing. 2 PUFFS EVERY 6 HOURS AS NEEDED FOR WHEEZING 04/13/19  Yes [name], MD  albuterol (PROVENTIL) (2.5 MG/3ML) 0.083% nebulizer solution NEBULIZE 1 VIAL EVERY 6-8 HOURS AS NEEDED FOR WHEEZING OR SHORTNESS OF BREATH Patient taking differently: Take 2.5 mg by nebulization every 6 (six) hours as needed for wheezing or shortness of breath. NEBULIZE 1 VIAL EVERY 6-8 HOURS AS NEEDED FOR WHEEZING OR SHORTNESS OF BREATH 07/15/19  Yes [name], MD  azelastine (ASTELIN) 0.1 % nasal spray Place 2 sprays into both nostrils every morning. Patient taking differently: Place 2 sprays into both nostrils daily as needed for allergies.  04/12/18  Yes Johnson, Clanford L, MD  b complex vitamins tablet Take 1 tablet by mouth daily.   Yes [provider]  CALCIUM-VITAMIN D PO Take 1 tablet by mouth 2 (two) times daily.    Yes [provider]  carvedilol (COREG) 3.125 MG tablet Take 2 tablets (6.25 mg total) by mouth 2 (two) times daily with a meal. 08/04/19  Yes Burns, Claudina Lick, MD  indapamide (LOZOL) 1.25 MG tablet Take 1 tablet (1.25 mg total) by mouth  daily. 08/08/19  Yes [name], MD  ipratropium-albuterol (DUONEB) 0.5-2.5 (3) MG/3ML SOLN Take 3 mLs by nebulization every 4 (four) hours as needed. Patient taking differently: Take 3 mLs by nebulization every 4 (four) hours as needed (wheezing/SOB).  04/13/19  Yes [name], MD  Leuprolide Acetate (LUPRON IJ) Inject 1 Dose as directed every 4 (four) months. This is administered by [facility]   Yes [provider]  mometasone (NASONEX) 50 MCG/ACT nasal spray USE 2 SPRAYS IN EACH NOSTRIL ONCE DAILY. Patient taking differently: Place 2 sprays into the nose daily.  03/07/19  Yes [name], MD  Multiple Vitamins-Minerals (CENTRUM SILVER 50+MEN PO) Take 1 tablet by mouth daily.   Yes [provider]  NARCAN 4 MG/0.1ML LIQD nasal spray kit Place 0.4 mg into the nose once as needed (overdose).  08/18/18  Yes [provider]  olmesartan (BENICAR) 20 MG tablet Take 1 tablet (20 mg total) by mouth 2 (two) times daily. 08/04/19  Yes Burns, Claudina Lick, MD  oxyCODONE (OXY IR/ROXICODONE) 5 MG immediate release tablet Take 1 tablet (5 mg total) by mouth every 4 (four) hours as needed for severe pain. Patient taking differently: Take 2.5 mg by mouth every 4 (four) hours as needed for severe pain.  05/18/19  Yes Wyatt Portela, MD  predniSONE (DELTASONE) 5 MG tablet Take 1 tablet (5 mg total) by mouth daily with breakfast. Patient taking differently: Take 5 mg by mouth 2 (two) times daily with a meal.  09/21/18  Yes Dahal, Marlowe Aschoff, MD  solifenacin (VESICARE) 5 MG tablet Take 5 mg by mouth daily. 08/11/19  Yes [provider]  sulfamethoxazole-trimethoprim (BACTRIM DS) 800-160 MG tablet Take 2 tablets by mouth every 8 (eight) hours for 21 days. 08/09/19 08/30/19 Yes [name], MD  SYMBICORT 160-4.5 MCG/ACT  inhaler Inhale 2 puffs into the lungs 2 (two) times daily. Patient taking differently: Inhale 2 puffs into the lungs 2 (two) times daily.  08/07/19  Yes [name], MD  diclofenac sodium (VOLTAREN) 1 % GEL Apply 4 g topically 4 (four) times daily. Patient not taking: Reported on 08/15/2019 09/21/18   Terrilee Croak, MD  loperamide (IMODIUM) 2 MG capsule Take 2 capsules (4 mg total) by mouth every 6 (six) hours as needed for diarrhea or loose stools. Patient not taking: Reported on 08/15/2019 12/30/18   Nita Sells, MD  meclizine (ANTIVERT) 12.5 MG tablet TAKE (1) TABLET THREE TIMES DAILY AS NEEDED FOR DIZZINESS. Patient not taking: Reported on 08/15/2019 06/16/19   [name], MD  SYRINGE-NEEDLE, DISP, 3 ML 3 ML MISC Use to inject b12 monthly. 04/19/18   [name], MD  tamsulosin (FLOMAX) 0.4 MG CAPS capsule Take 1 capsule (0.4 mg total) by mouth daily. Patient not taking: Reported on 08/15/2019 09/22/18   Terrilee Croak, MD    Physical Exam: Vitals:   08/15/19 1730 08/15/19 1731 08/15/19 1800 08/15/19 1830  BP: (!) 137/99 (!) 137/99 110/90 (!) 115/106  Pulse:  87  78  Resp: (!) 24 (!) 22  (!) 21  Temp:      TempSrc:      SpO2:  94%  97%     . General:  Appears calm but somnolent . Eyes:  PERRL, EOMI, normal lids, iris . ENT:  grossly normal hearing, lips & tongue, mmm . Neck:  no LAD, masses or thyromegaly . Cardiovascular:  RRR, no m/r/g. No LE edema.  [name] Respiratory:   CTA bilaterally with no wheezes/rales/rhonchi.  Mildly increased respiratory effort. . Abdomen:  soft, NT, ND, NABS . Skin:  no rash or induration seen on limited exam . Musculoskeletal:  grossly normal tone BUE/BLE, good ROM, no bony abnormality. [name] Psychiatric: somnolent mood and affect, speech fluent and appropriate, AOx3 Neurologic:  CN 2-12 grossly intact, moves all extremities in coordinated fashion   Radiological Exams on Admission: DG Chest 2 View  Result Date: 08/15/2019 CLINICAL DATA:  Shortness of breath. EXAM: CHEST - 2 VIEW COMPARISON:  CT 08/09/2019.  Chest x-ray 08/08/2019. FINDINGS: Heart size normal. Low lung volumes. Left upper lung and  bibasilar pulmonary subsegmental atelectasis and pulmonary infiltrates again noted. Similar findings noted on prior exam. No pleural effusion or pneumothorax. Stable scratched it prior vertebroplasties. Stable thoracic compression fractures. Diffuse osteopenia degenerative change. IMPRESSION: Times. Left upper lung and bibasilar subsegmental atelectasis and pulmonary infiltrates again noted. Similar findings noted on prior exam. Electronically Signed   By: [place]   On: 08/15/2019 12:36   CT Head Wo Contrast  Result Date: 08/15/2019 CLINICAL DATA:  Encephalopathy, hallucinations EXAM: CT HEAD WITHOUT CONTRAST TECHNIQUE: Contiguous axial images were obtained from the base of the skull through the vertex without intravenous contrast. COMPARISON:  04/11/2018 FINDINGS: Brain: No evidence of acute infarction, hemorrhage, extra-axial collection, ventriculomegaly, or mass effect. Generalized cerebral atrophy. Periventricular white matter low attenuation likely secondary to microangiopathy. Vascular: Cerebrovascular atherosclerotic calcifications are noted. Skull: Negative for fracture or focal lesion. Sinuses/Orbits: Visualized portions of the orbits are unremarkable. Visualized portions of the paranasal sinuses and mastoid air cells are unremarkable. Other: None. IMPRESSION: 1. No acute intracranial pathology. 2. Chronic microvascular disease and cerebral atrophy. Electronically Signed   By: Kathreen Devoid   On: 08/15/2019 13:25    EKG: Independently reviewed.  NSR with rate 81; nonspecific ST changes with  no evidence of acute ischemia   Labs on Admission: I have personally reviewed the available labs and imaging studies at the time of the admission.  Pertinent labs:   Na++ 120; 134 on 1/12 CO2 20 Glucose 105 BUN 21/Creatinine 1.29/GFR 58; 15/0.71/111 on 1/12 BNP 82.8 HS troponin 3 Lactate 3.2 WBC 9.4 Hgb 12.8 COVID negative   Assessment/Plan Principal Problem:   Dehydration with  hyponatremia Active Problems:   Essential hypertension   Hyperlipidemia with target LDL less than 100   Secondary malignant neoplasm of bone and bone marrow (HCC)   AKI (acute kidney injury) ([place])   Lesion of right lung   Dehydration with hyponatremia, AKI -Patient presenting with confusion, found to have dehydration -Has chronically low Na++ but down from 134 -> 120 -Etiology appears to be hypovolemic hyponatremia -Suspect that confusion is related to this issue -In an effort to prevent overcorrection (>49mq/L/day so as to avoid central pontine myelinolysis, will give IVF infusion at 75 cc/hr and continue to monitor q12h BMP for now -If improved, possibly home in the next day or two - so obs for now  Hallucinations, confusion -As noted above, most likely associated with dehydration and hyponatremia -With h/o metastatic prostate CA, brain mets are a consideration - but head CT was negative today -Polypharmacy may also be a consideration  Abnormal chest CT Pulm consult re: PNA, malignancy -Dr. IValeta Harmshas recommended CAP coverage -Needs f/u chest CT as noted by rads  Prostate CA  - 5 years ago, father had it.  PSA 21, biopsies positive.   Went for prostatectomy, already in LN and so removed LN with prostatectomy.   -Taking Xitiga and Lupron.   -PSA stable.   -Significant compression fractures, had recurrent kyphoplasty without improvement.  -Planning to start Prolia when able.   -No other evidence of mets elsewhere. -Can consult urology/onc if needed   Note: This patient has been tested and is negative for the novel coronavirus COVID-19.  DVT prophylaxis:  Lovenox  Code Status: DNI - confirmed with family Family Communication: I discussed the patient's wife at the time of admission Disposition Plan:  Home once clinically improved Consults called: Pulmonology  Admission status: It is my clinical opinion that referral for OBSERVATION is reasonable and necessary in this patient  based on the above information provided. The aforementioned taken together are felt to place the patient at high risk for further clinical deterioration. However it is anticipated that the patient may be medically stable for discharge from the hospital within 24 to 48 hours.    JKarmen BongoMD [facility]   How to contact the TMountainview Medical CenterAttending or Consulting provider [place] covering provider during after hours [place] for this patient?  1. Check the care team in [facility]and look for a) attending/consulting TRH provider listed and b) the TCommunity [facility] Parkteam listed 2. Log into [email or web address] and use [place]'s universal password to access. If you do not have the password, please contact the hospital operator. 3. Locate the TWhitesburg Arh Hospitalprovider you are looking for under [facility] and page to a number that you can be directly reached. 4. If you still have difficulty reaching the provider, please page the [facility](Director on Call) for the Hospitalists listed on amion for assistance.   08/15/2019, 7:03 PM

## 2019-08-15 NOTE — Consult Note (Signed)
NAME:  SHELTON SQUARE, MRN:  [id number], DOB:  1956-04-08, LOS: 0 ADMISSION DATE:  08/15/2019, CONSULTATION DATE: 08/15/2019 REFERRING MD: Dr. Lorin Mercy, CHIEF COMPLAINT: Abnormal CT  Brief History   64 year old gentleman admitted for hallucination abnormal CT scan right middle lobe.,  Former smoker  History of present illness   This is a 64 year old gentleman with a past medical history significant for metastatic prostate cancer to his pelvis, history of hypertension hyperlipidemia chronic back pain.  He was recently seen for evaluation of cough and a chest x-ray was completed which revealed a right middle lobe abnormal infiltrate by his primary care provider.  Patient was sent for CT imaging which revealed right middle lobe nodular opacity within this location that matched the chest x-ray.  Patient was started on Bactrim and continued to have back pain trouble falling asleep taking some of his pain medications at home as well.  Patient started to develop hallucinations and was brought to the emergency room for evaluation.  CT scan revealing probable pneumonia however could not exclude malignancy.  Of note, patient is a former smoker quit 1985.  He is a little bit more lucid in the emergency department today.  He is still somewhat confused and difficult to obtain history from.  Most history was obtained from the record as well as discussion with patient's attending physician Dr. Lorin Mercy.  Pulmonary was consulted for recommendations of the abnormal CT scan.  Once evaluated in the ER patient is much more lucid able to give [place] some history about his troubles.  He does admit to taking more of his pain medications that he may should have.  He said he felt a little high or intoxicated.  He then started to hallucinate.  When asked about any other previous drug use history he replied "no further comment"  Past Medical History   Past Medical History:  Diagnosis Date  . Allergic rhinitis   . At risk for sleep  apnea    STOP-BANG SCORE = 5    (routed to pt's pcp 09-08-2018)  . Chronic constipation   . Chronic low back pain   . Chronic pain   . DOE (dyspnea on exertion)   . History of kidney stones   . History of recent pneumonia 08/10/2018   acute bronchopneumonia;  follow-up cxr 08-28-2018 in epic  . History of vertebral compression fracture    T10 and T12 s/p kyphoplasty  . Hx of colonic polyps   . Hyperplasia of prostate with lower urinary tract symptoms (LUTS)   . Hypertension   . Mild persistent asthma    followed by pcp  . Numbness in both legs    secondary to a fall, per pt has had work-up and couldn't find anything  . Osteopenia   . Prostate cancer metastatic to pelvis [facility]) urologist-- dr borden/  oncologist -- dr Alen Blew   dx 2014 advanced prostate cancer with pelvid adenopathy,  Stage T2c, Gleason 7;  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  . Wears glasses      Significant Hospital Events   08/15/2019 hospital admission  Consults:  Pulmonary critical care  Procedures:    Significant Diagnostic Tests:  08/09/2019 CT chest: There is a reticular nodular opacity within the right middle lobe it looks mostly inflammatory however an underlying malignancy is not excluded. The patient's images have been independently reviewed by me.    08/15/2019: CT head: Chronic microvascular disease, no acute abnormality.  Micro Data:  COVID-19: Negative Flu: Negative  Antimicrobials:  Interim history/subjective:  Per HPI above  Objective   Blood pressure (!) 137/99, pulse 87, temperature 98.1 F (36.7 C), temperature source Oral, resp. rate (!) 22, SpO2 94 %.       No intake or output data in the 24 hours ending 08/15/19 1814 There were no vitals filed for this visit.  Examination: General: Elderly male, resting in bed no distress alert oriented to person and place HENT: NCAT, sclera clear tracking appropriately Lungs: Bilateral breath sounds, clear to auscultation, no crackles no  wheeze Cardiovascular: Regular rate rhythm, S1-S2 no MRG Abdomen: Soft, nontender, mildly distended, lower pelvis slightly distended patient states that he really asked to urinate, distended bladder Extremities: No significant lower extremity edema Neuro: Alert oriented following commands moves all 4 extremities spontaneously GU: Deferred  Resolved Hospital Problem list     Assessment & Plan:    Abnormal CT imaging of the chest Right middle lobe nodular/inflammatory appearing infiltrate. Former smoker, quit 1985 -Likely this represents community-acquired pneumonia with a very small infiltrate. -Would recommend community-acquired pneumonia coverage. -Patient does need follow-up CT imaging of the chest. - I will have the patient establish care with me in the office upon discharge from the hospital. -I believe a repeat noncontrasted CT of the chest in 8 to 10 weeks from now would be appropriate to ensure resolution of the abnormality.  If persistent we will proceed with next best steps for evaluation of potential underlying malignancy  Hallucinations, altered mental status confusion, hyponatremia -This appears to be somewhat better. -I suspect this is related to polypharmacy. -Patient also admitted to me that he had taken several of his pain medications for his back. -Most of this appears to have resolved. -Additionally Bactrim which he was taking and possible pneumonia could have played a role.  Acute renal failure Lactic acidosis -IV fluids, recheck BMP in the morning, likely prerenal due to history  We appreciate the consultation.  Do not hesitate to call with any concerns.  Pulmonary will follow.   Labs   CBC: Recent Labs  Lab 08/15/19 1212  WBC 9.4  NEUTROABS 6.7  HGB 12.8*  HCT 35.4*  MCV 87.6  PLT 676    Basic Metabolic Panel: Recent Labs  Lab 08/15/19 1212  NA 120*  K 4.0  CL 89*  CO2 20*  GLUCOSE 105*  BUN 21  CREATININE 1.29*  CALCIUM 9.7    GFR: Estimated Creatinine Clearance: 64 mL/min (A) (by C-G formula based on SCr of 1.29 mg/dL (H)). Recent Labs  Lab 08/15/19 1212 08/15/19 1213 08/15/19 1413  WBC 9.4  --   --   LATICACIDVEN  --  3.2* 1.5    Liver Function Tests: No results for input(s): AST, ALT, ALKPHOS, BILITOT, PROT, ALBUMIN in the last 168 hours. No results for input(s): LIPASE, AMYLASE in the last 168 hours. No results for input(s): AMMONIA in the last 168 hours.  ABG    Component Value Date/Time   TCO2 27 08/10/2018 1409     Coagulation Profile: No results for input(s): INR, PROTIME in the last 168 hours.  Cardiac Enzymes: No results for input(s): CKTOTAL, CKMB, CKMBINDEX, TROPONINI in the last 168 hours.  HbA1C: Hgb A1c MFr Bld  Date/Time Value Ref Range Status  11/25/2018 11:05 AM 5.1 4.6 - 6.5 % Final    Comment:    Glycemic Control Guidelines for People with Diabetes:Non Diabetic:  <6%Goal of Therapy: <7%Additional Action Suggested:  >8%   09/02/2017 09:15 AM 5.7 4.6 - 6.5 %  Final    Comment:    Glycemic Control Guidelines for People with Diabetes:Non Diabetic:  <6%Goal of Therapy: <7%Additional Action Suggested:  >8%     CBG: No results for input(s): GLUCAP in the last 168 hours.  Review of Systems:   Review of Systems  Constitutional: Negative for chills, fever, malaise/fatigue and weight loss.  HENT: Negative for hearing loss, sore throat and tinnitus.   Eyes: Negative for blurred vision and double vision.  Respiratory: Positive for shortness of breath. Negative for cough, hemoptysis, sputum production, wheezing and stridor.   Cardiovascular: Negative for chest pain, palpitations, orthopnea, leg swelling and PND.  Gastrointestinal: Negative for abdominal pain, constipation, diarrhea, heartburn, nausea and vomiting.  Genitourinary: Negative for dysuria, hematuria and urgency.  Musculoskeletal: Positive for back pain and myalgias. Negative for joint pain.  Skin: Negative for  itching and rash.  Neurological: Negative for dizziness, tingling, weakness and headaches.  Endo/Heme/Allergies: Negative for environmental allergies. Does not bruise/bleed easily.  Psychiatric/Behavioral: Positive for hallucinations. Negative for depression. The patient is not nervous/anxious and does not have insomnia.   All other systems reviewed and are negative.    Past Medical History  He,  has a past medical history of Allergic rhinitis, At risk for sleep apnea, Chronic constipation, Chronic low back pain, Chronic pain, DOE (dyspnea on exertion), History of kidney stones, History of recent pneumonia (08/10/2018), History of vertebral compression fracture, colonic polyps, Hyperplasia of prostate with lower urinary tract symptoms (LUTS), Hypertension, Mild persistent asthma, Numbness in both legs, Osteopenia, Prostate cancer metastatic to pelvis [facility]) (urologist-- dr borden/  oncologist -- dr Alen Blew), and Wears glasses.   Surgical History    Past Surgical History:  Procedure Laterality Date  . COLONOSCOPY    . IR KYPHO EA ADDL LEVEL THORACIC OR LUMBAR  08/17/2018  . IR KYPHO EA ADDL LEVEL THORACIC OR LUMBAR  08/26/2018  . IR KYPHO LUMBAR INC FX REDUCE BONE BX UNI/BIL CANNULATION INC/IMAGING  08/26/2018  . IR KYPHO THORACIC WITH BONE BIOPSY  08/17/2018  . IR RADIOLOGIST EVAL & MGMT  08/11/2018  . IR RADIOLOGIST EVAL & MGMT  08/24/2018  . LYMPHADENECTOMY Left 01/05/2013   Procedure: ROBOTIC LYMPHADENECTOMY;  Surgeon: Dutch Gray, MD;  Location: [facility]S;  Service: Urology;  Laterality: Left;  . PROSTATE BIOPSY     11/12 positive biopsies  . ROTATOR CUFF REPAIR Left 2012  . TRANSURETHRAL RESECTION OF BLADDER TUMOR N/A 09/15/2018   Procedure: TRANSURETHRAL RESECTION OF BLADDER TUMOR (TURBT)WITH CYSTOSCOPY/ POST OPERATIVE INSTILLATION OF GEMCITABINE;  Surgeon: Raynelle Bring, MD;  Location: [facility];  Service: Urology;  Laterality: N/A;  GENERAL ANESTHESIA WITH PARALYSIS  . UMBILICAL HERNIA REPAIR   09-02-2001   dr t. price '@[facility]'$      Social History   reports that he quit smoking about 6 years ago. His smoking use included cigarettes. He has a 12.50 pack-year smoking history. He has never used smokeless tobacco. He reports previous alcohol use of about 2.0 standard drinks of alcohol per week. He reports that he does not use drugs.   Family History   His family history includes Arthritis in his mother; Cancer in his sister; Diabetes in his mother; Heart attack in his father; Hyperlipidemia in his mother; Hypertension in his father and mother; Prostate cancer in his father; Stroke in his maternal grandfather.   Allergies Allergies  Allergen Reactions  . Amlodipine Swelling    edema  . Tetracycline Rash     Home Medications  Prior to Admission medications  Medication Sig Start Date End Date Taking? Authorizing Provider  abiraterone acetate (ZYTIGA) 250 MG tablet Take 1,000 mg by mouth daily.  08/17/17  Yes [provider]  acetaminophen (TYLENOL) 325 MG tablet Take 325 mg by mouth daily as needed for fever.   Yes [provider]  albuterol (PROAIR HFA) 108 (90 Base) MCG/ACT inhaler 2 PUFFS EVERY 6 HOURS AS NEEDED FOR WHEEZING Patient taking differently: Inhale 2 puffs into the lungs every 6 (six) hours as needed for wheezing. 2 PUFFS EVERY 6 HOURS AS NEEDED FOR WHEEZING 04/13/19  Yes [name], MD  albuterol (PROVENTIL) (2.5 MG/3ML) 0.083% nebulizer solution NEBULIZE 1 VIAL EVERY 6-8 HOURS AS NEEDED FOR WHEEZING OR SHORTNESS OF BREATH Patient taking differently: Take 2.5 mg by nebulization every 6 (six) hours as needed for wheezing or shortness of breath. NEBULIZE 1 VIAL EVERY 6-8 HOURS AS NEEDED FOR WHEEZING OR SHORTNESS OF BREATH 07/15/19  Yes [name], MD  azelastine (ASTELIN) 0.1 % nasal spray Place 2 sprays into both nostrils every morning. Patient taking differently: Place 2 sprays into both nostrils daily as needed for allergies.  04/12/18  Yes Johnson,  Clanford L, MD  b complex vitamins tablet Take 1 tablet by mouth daily.   Yes [provider]  CALCIUM-VITAMIN D PO Take 1 tablet by mouth 2 (two) times daily.    Yes [provider]  carvedilol (COREG) 3.125 MG tablet Take 2 tablets (6.25 mg total) by mouth 2 (two) times daily with a meal. 08/04/19  Yes Burns, Claudina Lick, MD  indapamide (LOZOL) 1.25 MG tablet Take 1 tablet (1.25 mg total) by mouth daily. 08/08/19  Yes [name], MD  ipratropium-albuterol (DUONEB) 0.5-2.5 (3) MG/3ML SOLN Take 3 mLs by nebulization every 4 (four) hours as needed. Patient taking differently: Take 3 mLs by nebulization every 4 (four) hours as needed (wheezing/SOB).  04/13/19  Yes [name], MD  Leuprolide Acetate (LUPRON IJ) Inject 1 Dose as directed every 4 (four) months. This is administered by [facility]   Yes [provider]  mometasone (NASONEX) 50 MCG/ACT nasal spray USE 2 SPRAYS IN EACH NOSTRIL ONCE DAILY. Patient taking differently: Place 2 sprays into the nose daily.  03/07/19  Yes [name], MD  Multiple Vitamins-Minerals (CENTRUM SILVER 50+MEN PO) Take 1 tablet by mouth daily.   Yes [provider]  NARCAN 4 MG/0.1ML LIQD nasal spray kit Place 0.4 mg into the nose once as needed (overdose).  08/18/18  Yes [provider]  olmesartan (BENICAR) 20 MG tablet Take 1 tablet (20 mg total) by mouth 2 (two) times daily. 08/04/19  Yes Burns, Claudina Lick, MD  oxyCODONE (OXY IR/ROXICODONE) 5 MG immediate release tablet Take 1 tablet (5 mg total) by mouth every 4 (four) hours as needed for severe pain. Patient taking differently: Take 2.5 mg by mouth every 4 (four) hours as needed for severe pain.  05/18/19  Yes Wyatt Portela, MD  predniSONE (DELTASONE) 5 MG tablet Take 1 tablet (5 mg total) by mouth daily with breakfast. Patient taking differently: Take 5 mg by mouth 2 (two) times daily with a meal.  09/21/18  Yes Dahal, Marlowe Aschoff, MD  solifenacin (VESICARE) 5 MG  tablet Take 5 mg by mouth daily. 08/11/19  Yes [provider]  sulfamethoxazole-trimethoprim (BACTRIM DS) 800-160 MG tablet Take 2 tablets by mouth every 8 (eight) hours for 21 days. 08/09/19 08/30/19 Yes [name], MD  SYMBICORT 160-4.5 MCG/ACT inhaler Inhale  2 puffs into the lungs 2 (two) times daily. Patient taking differently: Inhale 2 puffs into the lungs 2 (two) times daily.  08/07/19  Yes [name], MD  diclofenac sodium (VOLTAREN) 1 % GEL Apply 4 g topically 4 (four) times daily. Patient not taking: Reported on 08/15/2019 09/21/18   Terrilee Croak, MD  loperamide (IMODIUM) 2 MG capsule Take 2 capsules (4 mg total) by mouth every 6 (six) hours as needed for diarrhea or loose stools. Patient not taking: Reported on 08/15/2019 12/30/18   Nita Sells, MD  meclizine (ANTIVERT) 12.5 MG tablet TAKE (1) TABLET THREE TIMES DAILY AS NEEDED FOR DIZZINESS. Patient not taking: Reported on 08/15/2019 06/16/19   [name], MD  SYRINGE-NEEDLE, DISP, 3 ML 3 ML MISC Use to inject b12 monthly. 04/19/18   [name], MD  tamsulosin (FLOMAX) 0.4 MG CAPS capsule Take 1 capsule (0.4 mg total) by mouth daily. Patient not taking: Reported on 08/15/2019 09/22/18   Terrilee Croak, MD    Garner Nash, DO [facility] 08/15/2019 6:14 PM

## 2019-08-15 NOTE — ED Notes (Signed)
[name] Holder, wife wants an update [phone].

## 2019-08-15 NOTE — ED Triage Notes (Signed)
Per pt, states he has been on an antibiotic for a few days-states he has something going on with his lungs-states he has been hallucinating, seeing people who aren't there-states he has prostate cancer that has spread to bones-complaining of back pain-diaphoretic

## 2019-08-16 DIAGNOSIS — N400 Enlarged prostate without lower urinary tract symptoms: Secondary | ICD-10-CM | POA: Diagnosis present

## 2019-08-16 DIAGNOSIS — J9811 Atelectasis: Secondary | ICD-10-CM | POA: Diagnosis present

## 2019-08-16 DIAGNOSIS — R443 Hallucinations, unspecified: Secondary | ICD-10-CM | POA: Diagnosis present

## 2019-08-16 DIAGNOSIS — E785 Hyperlipidemia, unspecified: Secondary | ICD-10-CM | POA: Diagnosis present

## 2019-08-16 DIAGNOSIS — G893 Neoplasm related pain (acute) (chronic): Secondary | ICD-10-CM | POA: Diagnosis present

## 2019-08-16 DIAGNOSIS — M4850XA Collapsed vertebra, not elsewhere classified, site unspecified, initial encounter for fracture: Secondary | ICD-10-CM | POA: Diagnosis present

## 2019-08-16 DIAGNOSIS — R911 Solitary pulmonary nodule: Secondary | ICD-10-CM

## 2019-08-16 DIAGNOSIS — C7951 Secondary malignant neoplasm of bone: Secondary | ICD-10-CM | POA: Diagnosis present

## 2019-08-16 DIAGNOSIS — I10 Essential (primary) hypertension: Secondary | ICD-10-CM | POA: Diagnosis present

## 2019-08-16 DIAGNOSIS — C61 Malignant neoplasm of prostate: Secondary | ICD-10-CM | POA: Diagnosis present

## 2019-08-16 DIAGNOSIS — E872 Acidosis: Secondary | ICD-10-CM | POA: Diagnosis present

## 2019-08-16 DIAGNOSIS — J4531 Mild persistent asthma with (acute) exacerbation: Secondary | ICD-10-CM | POA: Diagnosis present

## 2019-08-16 DIAGNOSIS — E871 Hypo-osmolality and hyponatremia: Secondary | ICD-10-CM | POA: Diagnosis present

## 2019-08-16 DIAGNOSIS — J189 Pneumonia, unspecified organism: Secondary | ICD-10-CM | POA: Diagnosis present

## 2019-08-16 DIAGNOSIS — E222 Syndrome of inappropriate secretion of antidiuretic hormone: Secondary | ICD-10-CM | POA: Diagnosis present

## 2019-08-16 DIAGNOSIS — J440 Chronic obstructive pulmonary disease with acute lower respiratory infection: Secondary | ICD-10-CM | POA: Diagnosis present

## 2019-08-16 DIAGNOSIS — Z888 Allergy status to other drugs, medicaments and biological substances status: Secondary | ICD-10-CM | POA: Diagnosis not present

## 2019-08-16 DIAGNOSIS — Z9079 Acquired absence of other genital organ(s): Secondary | ICD-10-CM | POA: Diagnosis not present

## 2019-08-16 DIAGNOSIS — Z20822 Contact with and (suspected) exposure to covid-19: Secondary | ICD-10-CM | POA: Diagnosis present

## 2019-08-16 DIAGNOSIS — R4182 Altered mental status, unspecified: Secondary | ICD-10-CM | POA: Diagnosis present

## 2019-08-16 DIAGNOSIS — N179 Acute kidney failure, unspecified: Secondary | ICD-10-CM | POA: Diagnosis present

## 2019-08-16 DIAGNOSIS — M8450XA Pathological fracture in neoplastic disease, unspecified site, initial encounter for fracture: Secondary | ICD-10-CM | POA: Diagnosis present

## 2019-08-16 DIAGNOSIS — M549 Dorsalgia, unspecified: Secondary | ICD-10-CM | POA: Diagnosis present

## 2019-08-16 DIAGNOSIS — R918 Other nonspecific abnormal finding of lung field: Secondary | ICD-10-CM | POA: Diagnosis present

## 2019-08-16 DIAGNOSIS — G9341 Metabolic encephalopathy: Secondary | ICD-10-CM | POA: Diagnosis present

## 2019-08-16 DIAGNOSIS — J441 Chronic obstructive pulmonary disease with (acute) exacerbation: Secondary | ICD-10-CM | POA: Diagnosis present

## 2019-08-16 DIAGNOSIS — E860 Dehydration: Secondary | ICD-10-CM | POA: Diagnosis present

## 2019-08-16 LAB — BASIC METABOLIC PANEL
Anion gap: 9 (ref 5–15)
Anion gap: 11 (ref 5–15)
Anion gap: 11 (ref 5–15)
Anion gap: 10 (ref 5–15)
BUN: 16 mg/dL (ref 8–23)
BUN: 19 mg/dL (ref 8–23)
BUN: 21 mg/dL (ref 8–23)
BUN: 22 mg/dL (ref 8–23)
CO2: 22 mmol/L (ref 22–32)
CO2: 21 mmol/L — ABNORMAL LOW (ref 22–32)
CO2: 21 mmol/L — ABNORMAL LOW (ref 22–32)
CO2: 21 mmol/L — ABNORMAL LOW (ref 22–32)
Calcium: 9.3 mg/dL (ref 8.9–10.3)
Calcium: 8.9 mg/dL (ref 8.9–10.3)
Calcium: 8.9 mg/dL (ref 8.9–10.3)
Calcium: 8.8 mg/dL — ABNORMAL LOW (ref 8.9–10.3)
Chloride: 90 mmol/L — ABNORMAL LOW (ref 98–111)
Chloride: 91 mmol/L — ABNORMAL LOW (ref 98–111)
Chloride: 92 mmol/L — ABNORMAL LOW (ref 98–111)
Chloride: 93 mmol/L — ABNORMAL LOW (ref 98–111)
Creatinine, Ser: 0.94 mg/dL (ref 0.61–1.24)
Creatinine, Ser: 0.91 mg/dL (ref 0.61–1.24)
Creatinine, Ser: 0.9 mg/dL (ref 0.61–1.24)
Creatinine, Ser: 0.89 mg/dL (ref 0.61–1.24)
GFR calc Af Amer: >60 mL/min (ref >60)
GFR calc Af Amer: >60 mL/min (ref >60)
GFR calc Af Amer: >60 mL/min (ref >60)
GFR calc Af Amer: >60 mL/min (ref >60)
GFR calc non Af Amer: >60 mL/min (ref >60)
GFR calc non Af Amer: >60 mL/min (ref >60)
GFR calc non Af Amer: >60 mL/min (ref >60)
GFR calc non Af Amer: >60 mL/min (ref >60)
Glucose, Bld: 70 mg/dL (ref 70–99)
Glucose, Bld: 79 mg/dL (ref 70–99)
Glucose, Bld: 84 mg/dL (ref 70–99)
Glucose, Bld: 81 mg/dL (ref 70–99)
Potassium: 3.4 mmol/L — ABNORMAL LOW (ref 3.5–5.1)
Potassium: 3.4 mmol/L — ABNORMAL LOW (ref 3.5–5.1)
Potassium: 3.5 mmol/L (ref 3.5–5.1)
Potassium: 3.6 mmol/L (ref 3.5–5.1)
Sodium: 121 mmol/L — ABNORMAL LOW (ref 135–145)
Sodium: 123 mmol/L — ABNORMAL LOW (ref 135–145)
Sodium: 124 mmol/L — ABNORMAL LOW (ref 135–145)
Sodium: 124 mmol/L — ABNORMAL LOW (ref 135–145)

## 2019-08-16 LAB — SODIUM
Sodium: 122 mmol/L — ABNORMAL LOW (ref 135–145)
Sodium: 124 mmol/L — ABNORMAL LOW (ref 135–145)
Sodium: 123 mmol/L — ABNORMAL LOW (ref 135–145)

## 2019-08-16 LAB — CBC
HCT: 33.4 % — ABNORMAL LOW (ref 39.0–52.0)
Hemoglobin: 12.1 g/dL — ABNORMAL LOW (ref 13.0–17.0)
MCH: 32 pg (ref 26.0–34.0)
MCHC: 36.2 g/dL — ABNORMAL HIGH (ref 30.0–36.0)
MCV: 88.4 fL (ref 80.0–100.0)
Platelets: 142 10*3/uL — ABNORMAL LOW (ref 150–400)
RBC: 3.78 MIL/uL — ABNORMAL LOW (ref 4.22–5.81)
RDW: 12.4 % (ref 11.5–15.5)
WBC: 7.2 10*3/uL (ref 4.0–10.5)
nRBC: 0 % (ref 0.0–0.2)

## 2019-08-16 LAB — MRSA PCR SCREENING: MRSA by PCR: NEGATIVE

## 2019-08-16 LAB — RAPID URINE DRUG SCREEN, HOSP PERFORMED
Amphetamines: NOT DETECTED
Barbiturates: NOT DETECTED
Benzodiazepines: NOT DETECTED
Cocaine: NOT DETECTED
Opiates: NOT DETECTED
Tetrahydrocannabinol: NOT DETECTED

## 2019-08-16 LAB — CORTISOL: Cortisol, Plasma: 6 ug/dL

## 2019-08-16 LAB — OSMOLALITY: Osmolality: 251 mOsm/kg — ABNORMAL LOW (ref 275–295)

## 2019-08-16 LAB — HIV ANTIBODY (ROUTINE TESTING W REFLEX): HIV Screen 4th Generation wRfx: NONREACTIVE

## 2019-08-16 LAB — OSMOLALITY, URINE: Osmolality, Ur: 634 mOsm/kg (ref 300–900)

## 2019-08-16 LAB — TSH: TSH: 1.616 u[IU]/mL (ref 0.350–4.500)

## 2019-08-16 LAB — URIC ACID: Uric Acid, Serum: 2.8 mg/dL — ABNORMAL LOW (ref 3.7–8.6)

## 2019-08-16 MED ORDER — ORAL CARE MOUTH RINSE
15.0000 mL | Freq: Two times a day (BID) | OROMUCOSAL | Status: DC
  Administered 2019-08-16 – 2019-08-18 (×6): 15 mL via OROMUCOSAL

## 2019-08-16 MED ORDER — POTASSIUM CHLORIDE CRYS ER 20 MEQ PO TBCR
40.0000 meq | EXTENDED_RELEASE_TABLET | Freq: Once | ORAL | Status: DC
  Filled 2019-08-16: qty 2

## 2019-08-16 MED ORDER — SODIUM CHLORIDE 3 % IV BOLUS: 50.0000 mL | Freq: Once | INTRAVENOUS | Status: DC

## 2019-08-16 MED ORDER — SODIUM CHLORIDE 3 % IV BOLUS
50.0000 mL | Freq: Once | INTRAVENOUS | Status: AC
  Administered 2019-08-16: 50 mL via INTRAVENOUS
  Filled 2019-08-16: qty 50

## 2019-08-16 MED ORDER — SODIUM CHLORIDE 3 % IV SOLN
250.0000 mL | Freq: Once | INTRAVENOUS | Status: AC
  Administered 2019-08-16: 250 mL via INTRAVENOUS
  Filled 2019-08-16: qty 500

## 2019-08-16 MED ORDER — CHLORHEXIDINE GLUCONATE CLOTH 2 % EX PADS
6.0000 | MEDICATED_PAD | Freq: Every day | CUTANEOUS | Status: DC
  Administered 2019-08-16: 6 via TOPICAL

## 2019-08-16 MED ORDER — HYDROXYZINE HCL 25 MG PO TABS
25.0000 mg | ORAL_TABLET | Freq: Three times a day (TID) | ORAL | Status: DC | PRN
  Administered 2019-08-16: 25 mg via ORAL
  Filled 2019-08-16: qty 1

## 2019-08-16 MED ORDER — POTASSIUM CHLORIDE 10 MEQ/100ML IV SOLN
10.0000 meq | INTRAVENOUS | Status: AC
  Administered 2019-08-16 (×4): 10 meq via INTRAVENOUS
  Filled 2019-08-16 (×4): qty 100

## 2019-08-16 MED ORDER — ABIRATERONE ACETATE 250 MG PO TABS: 1000.0000 mg | ORAL_TABLET | Freq: Every day | ORAL | Status: DC

## 2019-08-16 NOTE — ED Notes (Addendum)
Posie belt and safety mittens were placed on the pt for his safety. Pt seems increasingly confused and unable to form a sentence stating "Flying babies colliding with aircraft."  Pt yelling in the room to "Watch out!" and attempting to slide out of the bed. Pt repositioned and offered water. Condom catheter remains intact.

## 2019-08-16 NOTE — ED Notes (Signed)
Patient slid up in bed and given meal tray at this time.

## 2019-08-16 NOTE — Consult Note (Signed)
NAME:  [name] Holder, MRN:  [id number], DOB:  1956-04-08, LOS: 0 ADMISSION DATE:  08/15/2019, CONSULTATION DATE: 08/15/2019 REFERRING MD: Dr. Lorin Mercy, CHIEF COMPLAINT: Abnormal CT  Brief History   64 year old gentleman admitted for hallucination abnormal CT scan right middle lobe.,  Former smoker  History of present illness   This is a 64 year old gentleman with a past medical history significant for metastatic prostate cancer to his pelvis, history of hypertension hyperlipidemia chronic back pain.  He was recently seen for evaluation of cough and a chest x-ray was completed which revealed a right middle lobe abnormal infiltrate by his primary care provider.  Patient was sent for CT imaging which revealed right middle lobe nodular opacity within this location that matched the chest x-ray.  Patient was started on Bactrim and continued to have back pain trouble falling asleep taking some of his pain medications at home as well.  Patient started to develop hallucinations and was brought to the emergency room for evaluation.  CT scan revealing probable pneumonia however could not exclude malignancy.  Of note, patient is a former smoker quit 1985.  He is a little bit more lucid in the emergency department today.  He is still somewhat confused and difficult to obtain history from.  Most history was obtained from the record as well as discussion with patient's attending physician Dr. Lorin Mercy.  Pulmonary was consulted for recommendations of the abnormal CT scan.  Once evaluated in the ER patient is much more lucid able to give [place] some history about his troubles.  He does admit to taking more of his pain medications that he may should have.  He said he felt a little high or intoxicated.  He then started to hallucinate.  When asked about any other previous drug use history he replied "no further comment"  Past Medical History   Past Medical History:  Diagnosis Date  . Allergic rhinitis   . At risk for sleep  apnea    STOP-BANG SCORE = 5    (routed to pt's pcp 09-08-2018)  . Chronic constipation   . Chronic low back pain   . Chronic pain   . DOE (dyspnea on exertion)   . History of kidney stones   . History of recent pneumonia 08/10/2018   acute bronchopneumonia;  follow-up cxr 08-28-2018 in epic  . History of vertebral compression fracture    T10 and T12 s/p kyphoplasty  . Hx of colonic polyps   . Hyperplasia of prostate with lower urinary tract symptoms (LUTS)   . Hypertension   . Mild persistent asthma    followed by pcp  . Numbness in both legs    secondary to a fall, per pt has had work-up and couldn't find anything  . Osteopenia   . Prostate cancer metastatic to pelvis [facility]) urologist-- dr borden/  oncologist -- dr Alen Blew   dx 2014 advanced prostate cancer with pelvid adenopathy,  Stage T2c, Gleason 7;  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  . Wears glasses      Significant Hospital Events   08/15/2019 hospital admission  Consults:  Pulmonary critical care  Procedures:    Significant Diagnostic Tests:  08/09/2019 CT chest: There is a reticular nodular opacity within the right middle lobe it looks mostly inflammatory however an underlying malignancy is not excluded. The patient's images have been independently reviewed by me.    08/15/2019: CT head: Chronic microvascular disease, no acute abnormality.  Micro Data:  COVID-19: Negative Flu: Negative  Antimicrobials:  Interim history/subjective:   Patient still confused this morning. In restraints in the ED. But when you talk with him he is redirectable   Objective   Blood pressure (!) 152/127, pulse 82, temperature 98.1 F (36.7 C), temperature source Oral, resp. rate (!) 26, SpO2 95 %.        Intake/Output Summary (Last 24 hours) at 08/16/2019 0827 Last data filed at 08/16/2019 0400 Gross per 24 hour  Intake 2204 ml  Output --  Net 2204 ml   There were no vitals filed for this visit.  Examination: General:  elderly male , resting in bed, in restraints, confused, still hallucinating  HENT: NCAT, sclera clear, no nystagmus  Lungs: CTAB, no wheeze Cardiovascular: RRR, s1 s2  Abdomen: soft, nt ,nd  Extremities: no edema  Neuro: alert, confused to location, still hallucinating, calling out for his wife, but redirectable  GU: deferred  Resolved Hospital Problem list     Assessment & Plan:   Abnormal CT imaging of the chest Right middle lobe nodular/inflammatory appearing infiltrate. Former smoker, quit 1985 -Likely this represents community-acquired pneumonia with a very small infiltrate. - cap abx coverage  - follow up in our clinic in 8 weeks with repeat non-contrasted CT chest   Hallucinations, altered mental status confusion, hyponatremia, acute metabolic encephalopathy secondary to above  - home opiate use, ?some withdrawal component  - hyponatremia likely SIADH, received 0.9% saline overnight with little change  - will give 50cc 3% infused over 2 hours  - recheck sodium at noon - I am not convinced all of his mental status issues are related to his sodium  - I believe withdraw, renal failure, meds, and electrolytes are all playing a role  - discussed with Dr. Tawanna Solo, agree with upgrade to SDU status   Acute renal failure, improved  Lactic acidosis Hypokalemia  - would replete   History of prostate cancer - would consider renal ultrasound to rule obstruction?   Pulmonary will follow   Labs   CBC: Recent Labs  Lab 08/15/19 1212 08/16/19 0436  WBC 9.4 7.2  NEUTROABS 6.7  --   HGB 12.8* 12.1*  HCT 35.4* 33.4*  MCV 87.6 88.4  PLT 190 142*    Basic Metabolic Panel: Recent Labs  Lab 08/15/19 1212 08/16/19 0436  NA 120* 121*  K 4.0 3.4*  CL 89* 90*  CO2 20* 22  GLUCOSE 105* 70  BUN 21 16  CREATININE 1.29* 0.94  CALCIUM 9.7 9.3   GFR: Estimated Creatinine Clearance: 87.8 mL/min (by C-G formula based on SCr of 0.94 mg/dL). Recent Labs  Lab 08/15/19 1212  08/15/19 1213 08/15/19 1413 08/16/19 0436  WBC 9.4  --   --  7.2  LATICACIDVEN  --  3.2* 1.5  --     Liver Function Tests: No results for input(s): AST, ALT, ALKPHOS, BILITOT, PROT, ALBUMIN in the last 168 hours. No results for input(s): LIPASE, AMYLASE in the last 168 hours. No results for input(s): AMMONIA in the last 168 hours.  ABG    Component Value Date/Time   TCO2 27 08/10/2018 1409     Coagulation Profile: No results for input(s): INR, PROTIME in the last 168 hours.  Cardiac Enzymes: No results for input(s): CKTOTAL, CKMB, CKMBINDEX, TROPONINI in the last 168 hours.  HbA1C: Hgb A1c MFr Bld  Date/Time Value Ref Range Status  11/25/2018 11:05 AM 5.1 4.6 - 6.5 % Final    Comment:    Glycemic Control Guidelines for People with Diabetes:Non Diabetic:  <  6%Goal of Therapy: <7%Additional Action Suggested:  >8%   09/02/2017 09:15 AM 5.7 4.6 - 6.5 % Final    Comment:    Glycemic Control Guidelines for People with Diabetes:Non Diabetic:  <6%Goal of Therapy: <7%Additional Action Suggested:  >8%     CBG: No results for input(s): GLUCAP in the last 168 hours.  This patient is critically ill with multiple organ system failure; which, requires frequent high complexity decision making, assessment, support, evaluation, and titration of therapies. This was completed through the application of advanced monitoring technologies and extensive interpretation of multiple databases. During this encounter critical care time was devoted to patient care services described in this note for 32 minutes.   Garner Nash, DO [facility] 08/16/2019 8:27 AM

## 2019-08-16 NOTE — ED Notes (Addendum)
Patient is repeatedly yelling out for and talking to his wife who is not here. Patient is asking his wife "why don't you ask how I am doing for once, I am the one in the hospital". This RN asked the patient if he needed anything. Patient stated he was doing just fine. Will continue to monitor.

## 2019-08-16 NOTE — Progress Notes (Signed)
PROGRESS NOTE    [name] Holder  [id number] DOB: 1956/06/22 DOA: 08/15/2019 PCP: [name], MD   Brief Narrative: Patient is a 64 year old male with history of metastatic posterior cancer to the pelvis, hypertension, BPH, chronic back pain who presented with confusion, hallucination.  He was seen by his PCP on 1/12 and chest x-ray showed possible developing right middle lung infiltrate and he was started on antibiotics, Bactrim, and is scheduled for CT chest for further evaluation.  He reported that he started feeling poor, sleeping all the time, terrible back pain.  He was also short of breath for last couple of weeks.  On presentation he was found to be severely confused.  Had AKI, severe hyponatremia, lactate of 3.2.  He was started on antibiotics.  PCCM consulted.  Started on 3% saline for hyponatremia.  Admitted to stepdown.  Assessment & Plan:   Principal Problem:   Dehydration with hyponatremia Active Problems:   Essential hypertension   Hyperlipidemia with target LDL less than 100   Secondary malignant neoplasm of bone and bone marrow (HCC)   AKI (acute kidney injury) ([place])   Lesion of right lung   AMS (altered mental status)   Hyponatremia:  Sodium of 121 this morning.  Uric acid low, cortisol, TSH normal.  Urine sodium is 164.  Most likely this is SIADH.  Presented with confusion so started on 3% saline.  PCCM following.  Continue to monitor sodium every 6 hours.  Avoid correction of more than 8 millimol in 24 hours.  Altered mental status: Most likely associated with severe hyponatremia.  Polypharmacy could also have contributed.  CT head did not show any acute intracranial abnormalities.  Continue to monitor mental status  Suspected PNA: CXR showed Left upper lung and bibasilar subsegmental atelectasis and pulmonary infiltrates . CT chest showed Mixed reticulonodular opacities and small amount of interspersed ground-glass present in the right middle lobe    compatible with an acute infectious or inflammatory process. Non-contrast chest CT at 3-6 months is recommended. Started on azithromycin, ceftriaxone.  Metastatic prostate cancer: PSA of 21, biopsies positive.  Status post prostatectomy.  On Zytiga ,prednisone and Lupron.  History of compression fractures, had kyphoplasty without improvement.  Follow-up with urology/oncology as an outpatient.He follows with Dr Alen Blew and urology.  Hypertension: On olmesartan and carvedilol at home.  Blood pressure currently stable.  Currently these medications are on hold          DVT prophylaxis: Lovenox Code Status: Partial Family Communication: Talked to wife on phone on 08/16/19 Disposition Plan: Likely back to home after improvement in the mental status   Consultants: PCCM  Procedures: None  Antimicrobials:  Anti-infectives (From admission, onward)   Start     Dose/Rate Route Frequency Ordered Stop   08/15/19 2030  azithromycin (ZITHROMAX) 500 mg in sodium chloride 0.9 % 250 mL IVPB     500 mg 250 mL/hr over 60 Minutes Intravenous Every 24 hours 08/15/19 1912     08/15/19 2000  cefTRIAXone (ROCEPHIN) 2 g in sodium chloride 0.9 % 100 mL IVPB     2 g 200 mL/hr over 30 Minutes Intravenous Every 24 hours 08/15/19 1912        Subjective: Patient seen and examined at the bedside this morning.  Completely distended.  Mildly agitated.  Hemodynamically stable.  No meaningful conversation held with the patient due to his mental status.  Objective: Vitals:   08/16/19 0930 08/16/19 1000 08/16/19 1100 08/16/19 1200  BP: [name])  134/101 108/86 99/82 (!) 120/44  Pulse: 87 (!) 102 90 88  Resp: 18  18 19   Temp:      TempSrc:      SpO2: 96% (!) 72% 100% 100%    Intake/Output Summary (Last 24 hours) at 08/16/2019 1343 Last data filed at 08/16/2019 1158 Gross per 24 hour  Intake 2207 ml  Output --  Net 2207 ml   There were no vitals filed for this visit.  Examination:  General exam: Confused,  agitated HEENT:PERRL,Oral mucosa moist, Ear/Nose normal on gross exam Respiratory system: Bilateral equal air entry, normal vesicular breath sounds, no wheezes or crackles  Cardiovascular system: S1 & S2 heard, RRR. No JVD, murmurs, rubs, gallops or clicks. No pedal edema. Gastrointestinal system: Abdomen is nondistended, soft and nontender. No organomegaly or masses felt. Normal bowel sounds heard. Central nervous system: Not alert or oriented  extremities: No edema, no clubbing ,no cyanosis Skin: No rashes, lesions or ulcers,no icterus ,no pallor    Data Reviewed: I have personally reviewed following labs and imaging studies  CBC: Recent Labs  Lab 08/15/19 1212 08/16/19 0436  WBC 9.4 7.2  NEUTROABS 6.7  --   HGB 12.8* 12.1*  HCT 35.4* 33.4*  MCV 87.6 88.4  PLT 190 [id number]*   Basic Metabolic Panel: Recent Labs  Lab 08/15/19 1212 08/16/19 0436 08/16/19 0909 08/16/19 1156  NA 120* 121* 122* 123*  K 4.0 3.4*  --  3.4*  CL 89* 90*  --  91*  CO2 20* 22  --  21*  GLUCOSE 105* 70  --  79  BUN 21 16  --  19  CREATININE 1.29* 0.94  --  0.91  CALCIUM 9.7 9.3  --  8.9   GFR: Estimated Creatinine Clearance: 90.7 mL/min (by C-G formula based on SCr of 0.91 mg/dL). Liver Function Tests: No results for input(s): AST, ALT, ALKPHOS, BILITOT, PROT, ALBUMIN in the last 168 hours. No results for input(s): LIPASE, AMYLASE in the last 168 hours. No results for input(s): AMMONIA in the last 168 hours. Coagulation Profile: No results for input(s): INR, PROTIME in the last 168 hours. Cardiac Enzymes: No results for input(s): CKTOTAL, CKMB, CKMBINDEX, TROPONINI in the last 168 hours. BNP (last 3 results) No results for input(s): PROBNP in the last 8760 hours. HbA1C: No results for input(s): HGBA1C in the last 72 hours. CBG: No results for input(s): GLUCAP in the last 168 hours. Lipid Profile: No results for input(s): CHOL, HDL, LDLCALC, TRIG, CHOLHDL, LDLDIRECT in the last 72  hours. Thyroid Function Tests: Recent Labs    08/16/19 0909  TSH 1.616   Anemia Panel: No results for input(s): VITAMINB12, FOLATE, FERRITIN, TIBC, IRON, RETICCTPCT in the last 72 hours. Sepsis Labs: Recent Labs  Lab 08/15/19 1213 08/15/19 1413  LATICACIDVEN 3.2* 1.5    Recent Results (from the past 240 hour(s))  Respiratory Panel by RT PCR (Flu A&B, Covid) - Nasopharyngeal Swab     Status: None   Collection Time: 08/15/19  2:09 PM   Specimen: Nasopharyngeal Swab  Result Value Ref Range Status   SARS Coronavirus 2 by RT PCR NEGATIVE NEGATIVE Final    Comment: (NOTE) SARS-CoV-2 target nucleic acids are NOT DETECTED. The SARS-CoV-2 RNA is generally detectable in upper respiratoy specimens during the acute phase of infection. The lowest concentration of SARS-CoV-2 viral copies this assay can detect is 131 copies/mL. A negative result does not preclude SARS-Cov-2 infection and should not be used as the sole basis for treatment  or other patient management decisions. A negative result may occur with  improper specimen collection/handling, submission of specimen other than nasopharyngeal swab, presence of viral mutation(s) within the areas targeted by this assay, and inadequate number of viral copies (<131 copies/mL). A negative result must be combined with clinical observations, patient history, and epidemiological information. The expected result is Negative. Fact Sheet for Patients:  PinkCheek.be Fact Sheet for Healthcare Providers:  GravelBags.it This test is not yet ap proved or cleared by the [place] FDA and  has been authorized for detection and/or diagnosis of SARS-CoV-2 by FDA under an Emergency Use Authorization (EUA). This EUA will remain  in effect (meaning this test can be used) for the duration of the COVID-19 declaration under Section 564(b)(1) of the Act, 21 U.S.C. section 360bbb-3(b)(1), unless the  authorization is terminated or revoked sooner.    Influenza A by PCR NEGATIVE NEGATIVE Final   Influenza B by PCR NEGATIVE NEGATIVE Final    Comment: (NOTE) The Xpert Xpress SARS-CoV-2/FLU/RSV assay is intended as an aid in  the diagnosis of influenza from Nasopharyngeal swab specimens and  should not be used as a sole basis for treatment. Nasal washings and  aspirates are unacceptable for Xpert Xpress SARS-CoV-2/FLU/RSV  testing. Fact Sheet for Patients: PinkCheek.be Fact Sheet for Healthcare Providers: GravelBags.it This test is not yet approved or cleared by the [place] FDA and  has been authorized for detection and/or diagnosis of SARS-CoV-2 by  FDA under an Emergency Use Authorization (EUA). This EUA will remain  in effect (meaning this test can be used) for the duration of the  Covid-19 declaration under Section 564(b)(1) of the Act, 21  U.S.C. section 360bbb-3(b)(1), unless the authorization is  terminated or revoked. Performed at [facility], [place] [address] [place], [place] [address]   MRSA PCR Screening     Status: None   Collection Time: 08/16/19 10:30 AM   Specimen: Nasopharyngeal  Result Value Ref Range Status   MRSA by PCR NEGATIVE NEGATIVE Final    Comment:        The GeneXpert MRSA Assay (FDA approved for NASAL specimens only), is one component of a comprehensive MRSA colonization surveillance program. It is not intended to diagnose MRSA infection nor to guide or monitor treatment for MRSA infections. Performed at [facility], [place] [address] [place], [place] [address]          Radiology Studies: DG Chest 2 View  Result Date: 08/15/2019 CLINICAL DATA:  Shortness of breath. EXAM: CHEST - 2 VIEW COMPARISON:  CT 08/09/2019.  Chest x-ray 08/08/2019. FINDINGS: Heart size normal. Low lung volumes. Left upper lung and bibasilar pulmonary subsegmental  atelectasis and pulmonary infiltrates again noted. Similar findings noted on prior exam. No pleural effusion or pneumothorax. Stable scratched it prior vertebroplasties. Stable thoracic compression fractures. Diffuse osteopenia degenerative change. IMPRESSION: Times. Left upper lung and bibasilar subsegmental atelectasis and pulmonary infiltrates again noted. Similar findings noted on prior exam. Electronically Signed   By: [place]   On: 08/15/2019 12:36   CT Head Wo Contrast  Result Date: 08/15/2019 CLINICAL DATA:  Encephalopathy, hallucinations EXAM: CT HEAD WITHOUT CONTRAST TECHNIQUE: Contiguous axial images were obtained from the base of the skull through the vertex without intravenous contrast. COMPARISON:  04/11/2018 FINDINGS: Brain: No evidence of acute infarction, hemorrhage, extra-axial collection, ventriculomegaly, or mass effect. Generalized cerebral atrophy. Periventricular white matter low attenuation likely secondary to microangiopathy. Vascular: Cerebrovascular atherosclerotic calcifications are noted. Skull: Negative for  fracture or focal lesion. Sinuses/Orbits: Visualized portions of the orbits are unremarkable. Visualized portions of the paranasal sinuses and mastoid air cells are unremarkable. Other: None. IMPRESSION: 1. No acute intracranial pathology. 2. Chronic microvascular disease and cerebral atrophy. Electronically Signed   By: Kathreen Devoid   On: 08/15/2019 13:25        Scheduled Meds: . carvedilol  6.25 mg Oral BID WC  . Chlorhexidine Gluconate Cloth  6 each Topical Daily  . darifenacin  7.5 mg Oral Daily  . docusate sodium  100 mg Oral BID  . enoxaparin (LOVENOX) injection  40 mg Subcutaneous Q24H  . fluticasone  2 spray Each Nare Daily  . ipratropium-albuterol  3 mL Nebulization Q6H  . mouth rinse  15 mL Mouth Rinse BID  . mometasone-formoterol  2 puff Inhalation BID  . potassium chloride  40 mEq Oral Once  . predniSONE  5 mg Oral BID WC  . sodium chloride  flush  3 mL Intravenous Q12H   Continuous Infusions: . azithromycin Stopped (08/16/19 0400)  . cefTRIAXone (ROCEPHIN)  IV Stopped (08/16/19 0358)     LOS: 0 days    Time spent: 35 mins.More than 50% of that time was spent in counseling and/or coordination of care.      Shelly Coss, MD [facility] Pager [phone]  If 7PM-7AM, please contact night-coverage [email or web address] Password [facility] 08/16/2019, 1:43 PM

## 2019-08-16 NOTE — ED Notes (Signed)
Pt seems increasing confused and agitated. Pt constantly yelling out for "Juliann Pulse" and trying to get out of bed. Pt was repositioned numerous times due to pt moving towards the foot of the bed trying to get up.

## 2019-08-16 NOTE — ED Notes (Signed)
Pt repositioned in the bed due to pt attempting to sliding out of bed in an effort to get up.

## 2019-08-16 NOTE — Progress Notes (Signed)
PCCM:  Repeat sodium 123 We will give an additional 250cc 3% NaCl over the next 10 hours @ 25cc/hr  Repeat BMP @ 6:00PM  Garner Nash, DO [facility] 08/16/2019 1:53 PM

## 2019-08-16 NOTE — ED Notes (Signed)
Pt repositioned in bed due to pt trying to get out of bed and sliding down. Pt asked to remain in bed. Bed is in the lowest position and call bell is in reach.

## 2019-08-16 NOTE — Progress Notes (Signed)
Abiraterone (Zytiga):  ALT or AST > 5x ULN  Tbili > 3x ULN  Patient should be continued on prednisone if was receiving prednisone outpatient.   Zytiga on hold for active infection (CAP)  Eudelia Bunch, Pharm.D 08/16/2019 9:36 AM

## 2019-08-17 LAB — ACTH STIMULATION, 3 TIME POINTS
Cortisol, 30 Min: 16.8 ug/dL
Cortisol, 60 Min: 19 ug/dL
Cortisol, Base: 13.6 ug/dL

## 2019-08-17 LAB — BASIC METABOLIC PANEL
Anion gap: 13 (ref 5–15)
Anion gap: 11 (ref 5–15)
BUN: 23 mg/dL (ref 8–23)
BUN: 25 mg/dL — ABNORMAL HIGH (ref 8–23)
CO2: 19 mmol/L — ABNORMAL LOW (ref 22–32)
CO2: 18 mmol/L — ABNORMAL LOW (ref 22–32)
Calcium: 8.7 mg/dL — ABNORMAL LOW (ref 8.9–10.3)
Calcium: 8.5 mg/dL — ABNORMAL LOW (ref 8.9–10.3)
Chloride: 95 mmol/L — ABNORMAL LOW (ref 98–111)
Chloride: 95 mmol/L — ABNORMAL LOW (ref 98–111)
Creatinine, Ser: 0.84 mg/dL (ref 0.61–1.24)
Creatinine, Ser: 0.81 mg/dL (ref 0.61–1.24)
GFR calc Af Amer: >60 mL/min (ref >60)
GFR calc Af Amer: >60 mL/min (ref >60)
GFR calc non Af Amer: >60 mL/min (ref >60)
GFR calc non Af Amer: >60 mL/min (ref >60)
Glucose, Bld: 72 mg/dL (ref 70–99)
Glucose, Bld: 83 mg/dL (ref 70–99)
Potassium: 3.3 mmol/L — ABNORMAL LOW (ref 3.5–5.1)
Potassium: 3.6 mmol/L (ref 3.5–5.1)
Sodium: 127 mmol/L — ABNORMAL LOW (ref 135–145)
Sodium: 124 mmol/L — ABNORMAL LOW (ref 135–145)

## 2019-08-17 LAB — SODIUM
Sodium: 126 mmol/L — ABNORMAL LOW (ref 135–145)
Sodium: 127 mmol/L — ABNORMAL LOW (ref 135–145)
Sodium: 130 mmol/L — ABNORMAL LOW (ref 135–145)

## 2019-08-17 MED ORDER — COSYNTROPIN 0.25 MG IJ SOLR
0.2500 mg | Freq: Once | INTRAMUSCULAR | Status: AC
  Administered 2019-08-17: 0.25 mg via INTRAVENOUS
  Filled 2019-08-17: qty 0.25

## 2019-08-17 MED ORDER — POTASSIUM CHLORIDE 10 MEQ/100ML IV SOLN
10.0000 meq | INTRAVENOUS | Status: AC
  Administered 2019-08-17 (×2): 10 meq via INTRAVENOUS
  Filled 2019-08-17 (×2): qty 100

## 2019-08-17 MED ORDER — SODIUM CHLORIDE 3 % IV SOLN
25.0000 mL/h | Freq: Once | INTRAVENOUS | Status: AC
  Administered 2019-08-17: 25 mL/h via INTRAVENOUS
  Filled 2019-08-17: qty 500

## 2019-08-17 MED ORDER — METHYLPREDNISOLONE SODIUM SUCC 40 MG IJ SOLR
40.0000 mg | Freq: Two times a day (BID) | INTRAMUSCULAR | Status: DC
  Administered 2019-08-17 – 2019-08-19 (×5): 40 mg via INTRAVENOUS
  Filled 2019-08-17 (×5): qty 1

## 2019-08-17 MED ORDER — FUROSEMIDE 20 MG PO TABS
20.0000 mg | ORAL_TABLET | Freq: Every day | ORAL | Status: DC
  Administered 2019-08-17 – 2019-08-19 (×3): 20 mg via ORAL
  Filled 2019-08-17 (×3): qty 1

## 2019-08-17 MED ORDER — SODIUM CHLORIDE 1 G PO TABS
2.0000 g | ORAL_TABLET | Freq: Two times a day (BID) | ORAL | Status: DC
  Administered 2019-08-17 – 2019-08-19 (×5): 2 g via ORAL
  Filled 2019-08-17 (×6): qty 2

## 2019-08-17 MED ORDER — AZITHROMYCIN 250 MG PO TABS
500.0000 mg | ORAL_TABLET | Freq: Every day | ORAL | Status: DC
  Administered 2019-08-17 – 2019-08-18 (×2): 500 mg via ORAL
  Filled 2019-08-17 (×2): qty 2

## 2019-08-17 NOTE — Evaluation (Signed)
SLP Cancellation Note  Patient Details Name: [name] Holder MRN: [id number] DOB: 02-08-1956   Cancelled treatment:       Reason Eval/Treat Not Completed: Fatigue/lethargy limiting ability to participate(slp assisted alicia NT to slide pt up in bed, congested breathing with pt disoriented and not following directions, eyes closed - not alert enough for eval, will continue efforts)   Macario Golds 08/17/2019, 7:40 AM   Kathleen Lime, MS [place] Office [phone]

## 2019-08-17 NOTE — Consult Note (Signed)
NAME:  [name] Holder, MRN:  [id number], DOB:  [birth year], LOS: 1 ADMISSION DATE:  08/15/2019, CONSULTATION DATE: 08/15/2019 REFERRING MD: Dr. Lorin Mercy, CHIEF COMPLAINT: Abnormal CT  Brief History   64 year old gentleman admitted for hallucination abnormal CT scan right middle lobe.,  Former smoker  History of present illness   This is a 64 year old gentleman with a past medical history significant for metastatic prostate cancer to his pelvis, history of hypertension hyperlipidemia chronic back pain.  He was recently seen for evaluation of cough and a chest x-ray was completed which revealed a right middle lobe abnormal infiltrate by his primary care provider.  Patient was sent for CT imaging which revealed right middle lobe nodular opacity within this location that matched the chest x-ray.  Patient was started on Bactrim and continued to have back pain trouble falling asleep taking some of his pain medications at home as well.  Patient started to develop hallucinations and was brought to the emergency room for evaluation.  CT scan revealing probable pneumonia however could not exclude malignancy.  Of note, patient is a former smoker quit 1985.  He is a little bit more lucid in the emergency department today.  He is still somewhat confused and difficult to obtain history from.  Most history was obtained from the record as well as discussion with patient's attending physician Dr. Lorin Mercy.  Pulmonary was consulted for recommendations of the abnormal CT scan.  Once evaluated in the ER patient is much more lucid able to give [place] some history about his troubles.  He does admit to taking more of his pain medications that he may should have.  He said he felt a little high or intoxicated.  He then started to hallucinate.  When asked about any other previous drug use history he replied "no further comment"  Past Medical History   Past Medical History:  Diagnosis Date  . Allergic rhinitis   . At risk for sleep  apnea    STOP-BANG SCORE = 5    (routed to pt's pcp 09-08-2018)  . Chronic constipation   . Chronic low back pain   . Chronic pain   . DOE (dyspnea on exertion)   . History of kidney stones   . History of recent pneumonia 08/10/2018   acute bronchopneumonia;  follow-up cxr 08-28-2018 in epic  . History of vertebral compression fracture    T10 and T12 s/p kyphoplasty  . Hx of colonic polyps   . Hyperplasia of prostate with lower urinary tract symptoms (LUTS)   . Hypertension   . Mild persistent asthma    followed by pcp  . Numbness in both legs    secondary to a fall, per pt has had work-up and couldn't find anything  . Osteopenia   . Prostate cancer metastatic to pelvis [facility]) urologist-- dr borden/  oncologist -- dr Alen Blew   dx 2014 advanced prostate cancer with pelvid adenopathy,  Stage T2c, Gleason 7;  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  . Wears glasses      Significant Hospital Events   08/15/2019 hospital admission  Consults:  Pulmonary critical care  Procedures:    Significant Diagnostic Tests:  08/09/2019 CT chest: There is a reticular nodular opacity within the right middle lobe it looks mostly inflammatory however an underlying malignancy is not excluded. The patient's images have been independently reviewed by me.    08/15/2019: CT head: Chronic microvascular disease, no acute abnormality.  Micro Data:  COVID-19: Negative Flu: Negative  Antimicrobials:  Interim history/subjective:   Remains confused.  Oriented x1 Sodium 124 today morning  Objective   Blood pressure (!) 146/88, pulse 97, temperature 99.1 F (37.3 C), temperature source Axillary, resp. rate (!) 22, height 5\' 7"  (1.702 m), weight 96.1 kg, SpO2 97 %.        Intake/Output Summary (Last 24 hours) at 08/17/2019 0946 Last data filed at 08/17/2019 0800 Gross per 24 hour  Intake 1088.63 ml  Output 1600 ml  Net -511.37 ml   Filed Weights   08/16/19 2313  Weight: 96.1 kg     Examination: Blood pressure (!) 146/88, pulse 97, temperature 99.1 F (37.3 C), temperature source Axillary, resp. rate (!) 22, height 5\' 7"  (1.702 m), weight 96.1 kg, SpO2 97 %. Gen:      No acute distress HEENT:  EOMI, sclera anicteric Neck:     No masses; no thyromegaly Lungs:    Clear to auscultation bilaterally; normal respiratory effort CV:         Regular rate and rhythm; no murmurs Abd:      + bowel sounds; soft, non-tender; no palpable masses, no distension Ext:    No edema; adequate peripheral perfusion Skin:      Warm and dry; no rash Neuro: alert and oriented x 3 Psych: normal mood and affect  Resolved Hospital Problem list     Assessment & Plan:  Hyponatremia, SIADH likely in the setting of community-acquired pneumonia CT reviewed with right middle lobe nodularity, infiltrate Repeat hypertonic saline today Suspect that he may have a component of adrenal insufficiency with low cortisol Check ACTH stim test Steroids as below for wheezing  Asthma/COPD exacerbation Is wheezing on examination today.  Has history of mild airway obstruction, ex-smoker IV Solu-Medrol.  Steroids may help improve the sodium as well Nebs every 4 hours  History of prostate cancer Per primary   Labs   CBC: Recent Labs  Lab 08/15/19 1212 08/16/19 0436  WBC 9.4 7.2  NEUTROABS 6.7  --   HGB 12.8* 12.1*  HCT 35.4* 33.4*  MCV 87.6 88.4  PLT 190 142*    Basic Metabolic Panel: Recent Labs  Lab 08/16/19 1156 08/16/19 1427 08/16/19 1741 08/16/19 1954 08/17/19 0158 08/17/19 0610 08/17/19 0821  NA 123*   < > 124* 124*  123* 127* 124* 126*  K 3.4*  --  3.5 3.6 3.3* 3.6  --   CL 91*  --  92* 93* 95* 95*  --   CO2 21*  --  21* 21* 19* 18*  --   GLUCOSE 79  --  84 81 72 83  --   BUN 19  --  21 22 23  25*  --   CREATININE 0.91  --  0.90 0.89 0.84 0.81  --   CALCIUM 8.9  --  8.9 8.8* 8.7* 8.5*  --    < > = values in this interval not displayed.   GFR: Estimated Creatinine  Clearance: 100.4 mL/min (by C-G formula based on SCr of 0.81 mg/dL). Recent Labs  Lab 08/15/19 1212 08/15/19 1213 08/15/19 1413 08/16/19 0436  WBC 9.4  --   --  7.2  LATICACIDVEN  --  3.2* 1.5  --     Liver Function Tests: No results for input(s): AST, ALT, ALKPHOS, BILITOT, PROT, ALBUMIN in the last 168 hours. No results for input(s): LIPASE, AMYLASE in the last 168 hours. No results for input(s): AMMONIA in the last 168 hours.  ABG    Component Value Date/Time  TCO2 27 08/10/2018 1409     Coagulation Profile: No results for input(s): INR, PROTIME in the last 168 hours.  Cardiac Enzymes: No results for input(s): CKTOTAL, CKMB, CKMBINDEX, TROPONINI in the last 168 hours.  HbA1C: Hgb A1c MFr Bld  Date/Time Value Ref Range Status  11/25/2018 11:05 AM 5.1 4.6 - 6.5 % Final    Comment:    Glycemic Control Guidelines for People with Diabetes:Non Diabetic:  <6%Goal of Therapy: <7%Additional Action Suggested:  >8%   09/02/2017 09:15 AM 5.7 4.6 - 6.5 % Final    Comment:    Glycemic Control Guidelines for People with Diabetes:Non Diabetic:  <6%Goal of Therapy: <7%Additional Action Suggested:  >8%     CBG: No results for input(s): GLUCAP in the last 168 hours.  Marshell Garfinkel MD [facility] Please see [email or web address] for pager details.  08/17/2019, 9:47 AM

## 2019-08-17 NOTE — Progress Notes (Signed)
PROGRESS NOTE    [name] Holder  [id number] DOB: [birth year] DOA: 08/15/2019 PCP: [name], MD   Brief Narrative: Patient is a 64 year old male with history of metastatic posterior cancer to the pelvis, hypertension, BPH, chronic back pain who presented with confusion, hallucination.  He was seen by his PCP on 1/12 and chest x-ray showed possible developing right middle lung infiltrate and he was started on antibiotics, Bactrim, and is scheduled for CT chest for further evaluation.  He reported that he started feeling poor, sleeping all the time, terrible back pain.  He was also short of breath for last couple of weeks.  On presentation he was found to be severely confused.  Had AKI, severe hyponatremia, lactate of 3.2.  He was started on antibiotics.  PCCM consulted.  Started on 3% saline for hyponatremia.  Admitted to stepdown.  Assessment & Plan:   Principal Problem:   Dehydration with hyponatremia Active Problems:   Essential hypertension   Hyperlipidemia with target LDL less than 100   Secondary malignant neoplasm of bone and bone marrow (HCC)   AKI (acute kidney injury) ([place])   Lesion of right lung   AMS (altered mental status)   Hyponatremia:  Last sodium of 126.  Uric acid low, cortisol, TSH normal.  Urine sodium is 164.  Most likely this is SIADH.  Presented with confusion so started on 3% saline.  PCCM following.  Continue to monitor sodium every 6 hours.  Avoid correction of more than 8 millimol in 24 hours. PCCM also suspecting adrenal insufficiency, doing ACTH stimulation test.  Also started on Solu-Medrol.  Altered mental status: Most likely associated with severe hyponatremia.  Polypharmacy could also have contributed.  CT head did not show any acute intracranial abnormalities.  Continue to monitor mental status  Suspected PNA: CXR showed Left upper lung and bibasilar subsegmental atelectasis and pulmonary infiltrates . CT chest showed Mixed reticulonodular  opacities and small amount of interspersed ground-glass present in the right middle lobe  compatible with an acute infectious or inflammatory process. Non-contrast chest CT at 3-6 months is recommended. Started on azithromycin, ceftriaxone. Started on steroids for wheezing.  Metastatic prostate cancer: PSA of 21, biopsies positive.  Status post prostatectomy.  On Zytiga ,prednisone and Lupron.  History of compression fractures, had kyphoplasty without improvement.  Follow-up with urology/oncology as an outpatient.He follows with Dr Alen Blew and urology.  Hypertension: On olmesartan and carvedilol at home.  Blood pressure currently stable.  Currently these medications are on hold          DVT prophylaxis: Lovenox Code Status: Partial Family Communication: Talked to wife on phone on 08/16/19 Disposition Plan: Patient is from home.  He is not clinically stable for discharge.  Needs PT evaluation before discharge planning.  Consultants: PCCM  Procedures: None  Antimicrobials:  Anti-infectives (From admission, onward)   Start     Dose/Rate Route Frequency Ordered Stop   08/17/19 1200  azithromycin (ZITHROMAX) tablet 500 mg     500 mg Oral Daily 08/17/19 1106     08/15/19 2030  azithromycin (ZITHROMAX) 500 mg in sodium chloride 0.9 % 250 mL IVPB  Status:  Discontinued     500 mg 250 mL/hr over 60 Minutes Intravenous Every 24 hours 08/15/19 1912 08/17/19 1106   08/15/19 2000  cefTRIAXone (ROCEPHIN) 2 g in sodium chloride 0.9 % 100 mL IVPB     2 g 200 mL/hr over 30 Minutes Intravenous Every 24 hours 08/15/19 1912  Subjective: Patient seen and examined at the bedside this morning.  Hemodynamically stable.  Still confused.  Did not participate with meaningful conversation.  Objective: Vitals:   08/17/19 0600 08/17/19 0700 08/17/19 0800 08/17/19 0832  BP: (!) 126/56  (!) 146/88   Pulse: 94  97   Resp: (!) 31 20 (!) 22   Temp:   99.1 F (37.3 C)   TempSrc:   Axillary   SpO2:  94% 96% 97% 97%  Weight:      Height:        Intake/Output Summary (Last 24 hours) at 08/17/2019 1112 Last data filed at 08/17/2019 0800 Gross per 24 hour  Intake 1088.63 ml  Output 1600 ml  Net -511.37 ml   Filed Weights   08/16/19 2313  Weight: 96.1 kg    Examination:  General exam: Confused, not in distress, sitter at the bedside HEENT: Ear/Nose normal on gross exam Respiratory system: No wheezes or crackles auscultated on anterior chest. Cardiovascular system: S1 & S2 heard, RRR. Gastrointestinal system: Abdomen is nondistended, soft and nontender. No organomegaly or masses felt. Normal bowel sounds heard. Central nervous system: Awake but not alert or oriented  extremities: No edema, no clubbing ,no cyanosis Skin: No rashes, lesions or ulcers,no icterus ,no pallor  Data Reviewed: I have personally reviewed following labs and imaging studies  CBC: Recent Labs  Lab 08/15/19 1212 08/16/19 0436  WBC 9.4 7.2  NEUTROABS 6.7  --   HGB 12.8* 12.1*  HCT 35.4* 33.4*  MCV 87.6 88.4  PLT 190 [id number]*   Basic Metabolic Panel: Recent Labs  Lab 08/16/19 1156 08/16/19 1427 08/16/19 1741 08/16/19 1954 08/17/19 0158 08/17/19 0610 08/17/19 0821  NA 123*   < > 124* 124*  123* 127* 124* 126*  K 3.4*  --  3.5 3.6 3.3* 3.6  --   CL 91*  --  92* 93* 95* 95*  --   CO2 21*  --  21* 21* 19* 18*  --   GLUCOSE 79  --  84 81 72 83  --   BUN 19  --  21 22 23  25*  --   CREATININE 0.91  --  0.90 0.89 0.84 0.81  --   CALCIUM 8.9  --  8.9 8.8* 8.7* 8.5*  --    < > = values in this interval not displayed.   GFR: Estimated Creatinine Clearance: 100.4 mL/min (by C-G formula based on SCr of 0.81 mg/dL). Liver Function Tests: No results for input(s): AST, ALT, ALKPHOS, BILITOT, PROT, ALBUMIN in the last 168 hours. No results for input(s): LIPASE, AMYLASE in the last 168 hours. No results for input(s): AMMONIA in the last 168 hours. Coagulation Profile: No results for input(s): INR,  PROTIME in the last 168 hours. Cardiac Enzymes: No results for input(s): CKTOTAL, CKMB, CKMBINDEX, TROPONINI in the last 168 hours. BNP (last 3 results) No results for input(s): PROBNP in the last 8760 hours. HbA1C: No results for input(s): HGBA1C in the last 72 hours. CBG: No results for input(s): GLUCAP in the last 168 hours. Lipid Profile: No results for input(s): CHOL, HDL, LDLCALC, TRIG, CHOLHDL, LDLDIRECT in the last 72 hours. Thyroid Function Tests: Recent Labs    08/16/19 0909  TSH 1.616   Anemia Panel: No results for input(s): VITAMINB12, FOLATE, FERRITIN, TIBC, IRON, RETICCTPCT in the last 72 hours. Sepsis Labs: Recent Labs  Lab 08/15/19 1213 08/15/19 1413  LATICACIDVEN 3.2* 1.5    Recent Results (from the past 240 hour(s))  Respiratory Panel by RT PCR (Flu A&B, Covid) - Nasopharyngeal Swab     Status: None   Collection Time: 08/15/19  2:09 PM   Specimen: Nasopharyngeal Swab  Result Value Ref Range Status   SARS Coronavirus 2 by RT PCR NEGATIVE NEGATIVE Final    Comment: (NOTE) SARS-CoV-2 target nucleic acids are NOT DETECTED. The SARS-CoV-2 RNA is generally detectable in upper respiratoy specimens during the acute phase of infection. The lowest concentration of SARS-CoV-2 viral copies this assay can detect is 131 copies/mL. A negative result does not preclude SARS-Cov-2 infection and should not be used as the sole basis for treatment or other patient management decisions. A negative result may occur with  improper specimen collection/handling, submission of specimen other than nasopharyngeal swab, presence of viral mutation(s) within the areas targeted by this assay, and inadequate number of viral copies (<131 copies/mL). A negative result must be combined with clinical observations, patient history, and epidemiological information. The expected result is Negative. Fact Sheet for Patients:  PinkCheek.be Fact Sheet for Healthcare  Providers:  GravelBags.it This test is not yet ap proved or cleared by the [place] FDA and  has been authorized for detection and/or diagnosis of SARS-CoV-2 by FDA under an Emergency Use Authorization (EUA). This EUA will remain  in effect (meaning this test can be used) for the duration of the COVID-19 declaration under Section 564(b)(1) of the Act, 21 U.S.C. section 360bbb-3(b)(1), unless the authorization is terminated or revoked sooner.    Influenza A by PCR NEGATIVE NEGATIVE Final   Influenza B by PCR NEGATIVE NEGATIVE Final    Comment: (NOTE) The Xpert Xpress SARS-CoV-2/FLU/RSV assay is intended as an aid in  the diagnosis of influenza from Nasopharyngeal swab specimens and  should not be used as a sole basis for treatment. Nasal washings and  aspirates are unacceptable for Xpert Xpress SARS-CoV-2/FLU/RSV  testing. Fact Sheet for Patients: PinkCheek.be Fact Sheet for Healthcare Providers: GravelBags.it This test is not yet approved or cleared by the [place] FDA and  has been authorized for detection and/or diagnosis of SARS-CoV-2 by  FDA under an Emergency Use Authorization (EUA). This EUA will remain  in effect (meaning this test can be used) for the duration of the  Covid-19 declaration under Section 564(b)(1) of the Act, 21  U.S.C. section 360bbb-3(b)(1), unless the authorization is  terminated or revoked. Performed at [facility] [place] [address], [place], [place] [address]   MRSA PCR Screening     Status: None   Collection Time: 08/16/19 10:30 AM   Specimen: Nasopharyngeal  Result Value Ref Range Status   MRSA by PCR NEGATIVE NEGATIVE Final    Comment:        The GeneXpert MRSA Assay (FDA approved for NASAL specimens only), is one component of a comprehensive MRSA colonization surveillance program. It is not intended to diagnose MRSA infection nor  to guide or monitor treatment for MRSA infections. Performed at [facility], [place] [address] [place], [place] [address]          Radiology Studies: DG Chest 2 View  Result Date: 08/15/2019 CLINICAL DATA:  Shortness of breath. EXAM: CHEST - 2 VIEW COMPARISON:  CT 08/09/2019.  Chest x-ray 08/08/2019. FINDINGS: Heart size normal. Low lung volumes. Left upper lung and bibasilar pulmonary subsegmental atelectasis and pulmonary infiltrates again noted. Similar findings noted on prior exam. No pleural effusion or pneumothorax. Stable scratched it prior vertebroplasties. Stable thoracic compression fractures. Diffuse osteopenia degenerative change. IMPRESSION:  Times. Left upper lung and bibasilar subsegmental atelectasis and pulmonary infiltrates again noted. Similar findings noted on prior exam. Electronically Signed   By: [place]   On: 08/15/2019 12:36   CT Head Wo Contrast  Result Date: 08/15/2019 CLINICAL DATA:  Encephalopathy, hallucinations EXAM: CT HEAD WITHOUT CONTRAST TECHNIQUE: Contiguous axial images were obtained from the base of the skull through the vertex without intravenous contrast. COMPARISON:  04/11/2018 FINDINGS: Brain: No evidence of acute infarction, hemorrhage, extra-axial collection, ventriculomegaly, or mass effect. Generalized cerebral atrophy. Periventricular white matter low attenuation likely secondary to microangiopathy. Vascular: Cerebrovascular atherosclerotic calcifications are noted. Skull: Negative for fracture or focal lesion. Sinuses/Orbits: Visualized portions of the orbits are unremarkable. Visualized portions of the paranasal sinuses and mastoid air cells are unremarkable. Other: None. IMPRESSION: 1. No acute intracranial pathology. 2. Chronic microvascular disease and cerebral atrophy. Electronically Signed   By: Kathreen Devoid   On: 08/15/2019 13:25        Scheduled Meds: . azithromycin  500 mg Oral Daily  . Chlorhexidine  Gluconate Cloth  6 each Topical Daily  . darifenacin  7.5 mg Oral Daily  . docusate sodium  100 mg Oral BID  . enoxaparin (LOVENOX) injection  40 mg Subcutaneous Q24H  . fluticasone  2 spray Each Nare Daily  . furosemide  20 mg Oral Daily  . ipratropium-albuterol  3 mL Nebulization Q6H  . mouth rinse  15 mL Mouth Rinse BID  . methylPREDNISolone (SOLU-MEDROL) injection  40 mg Intravenous Q12H  . mometasone-formoterol  2 puff Inhalation BID  . sodium chloride flush  3 mL Intravenous Q12H  . sodium chloride  2 g Oral BID WC   Continuous Infusions: . cefTRIAXone (ROCEPHIN)  IV Stopped (08/16/19 2352)  . sodium chloride (hypertonic) 25 mL/hr (08/17/19 1040)     LOS: 1 day    Time spent: 35 mins.More than 50% of that time was spent in counseling and/or coordination of care.      Shelly Coss, MD [facility] Pager [phone]  If 7PM-7AM, please contact night-coverage [email or web address] Password [facility] 08/17/2019, 11:12 AM

## 2019-08-17 NOTE — Progress Notes (Signed)
   Vital Signs MEWS/VS Documentation      08/17/2019 0832 08/17/2019 1127 08/17/2019 1200 08/17/2019 1207   MEWS Score:  --  2  4  4    MEWS Score Color:  --  Yellow  Red  Red   Resp:  --  --  (!) 31  (!) 29   Pulse:  --  --  (!) 104  (!) 102   BP:  --  --  (!) 139/48  (!) 139/48   Temp:  --  (!) 100.6 F (38.1 C)  --  --   O2 Device:  Room Air  --  --  --      Acute change, MD and charge RN made aware. See new orders.      Mathieu Schloemer K 08/17/2019,12:13 PM

## 2019-08-18 LAB — BASIC METABOLIC PANEL
Anion gap: 14 (ref 5–15)
BUN: 18 mg/dL (ref 8–23)
CO2: 16 mmol/L — ABNORMAL LOW (ref 22–32)
Calcium: 8.7 mg/dL — ABNORMAL LOW (ref 8.9–10.3)
Chloride: 100 mmol/L (ref 98–111)
Creatinine, Ser: 0.65 mg/dL (ref 0.61–1.24)
GFR calc Af Amer: >60 mL/min (ref >60)
GFR calc non Af Amer: >60 mL/min (ref >60)
Glucose, Bld: 139 mg/dL — ABNORMAL HIGH (ref 70–99)
Potassium: 3.5 mmol/L (ref 3.5–5.1)
Sodium: 130 mmol/L — ABNORMAL LOW (ref 135–145)

## 2019-08-18 LAB — SODIUM: Sodium: 130 mmol/L — ABNORMAL LOW (ref 135–145)

## 2019-08-18 MED ORDER — MOMETASONE FURO-FORMOTEROL FUM 200-5 MCG/ACT IN AERO: 2.0000 | INHALATION_SPRAY | Freq: Two times a day (BID) | RESPIRATORY_TRACT | Status: DC

## 2019-08-18 MED ORDER — POLYETHYLENE GLYCOL 3350 17 G PO PACK
17.0000 g | PACK | Freq: Every day | ORAL | Status: DC
  Administered 2019-08-18 – 2019-08-19 (×2): 17 g via ORAL
  Filled 2019-08-18 (×2): qty 1

## 2019-08-18 MED ORDER — AZITHROMYCIN 250 MG PO TABS
250.0000 mg | ORAL_TABLET | Freq: Every day | ORAL | Status: DC
  Administered 2019-08-19: 250 mg via ORAL
  Filled 2019-08-18: qty 1

## 2019-08-18 MED ORDER — IPRATROPIUM-ALBUTEROL 0.5-2.5 (3) MG/3ML IN SOLN
3.0000 mL | Freq: Three times a day (TID) | RESPIRATORY_TRACT | Status: DC
  Administered 2019-08-19 (×2): 3 mL via RESPIRATORY_TRACT
  Filled 2019-08-18 (×2): qty 3

## 2019-08-18 MED ORDER — BISACODYL 10 MG RE SUPP
10.0000 mg | Freq: Once | RECTAL | Status: AC
  Administered 2019-08-18: 10 mg via RECTAL
  Filled 2019-08-18: qty 1

## 2019-08-18 MED ORDER — SODIUM BICARBONATE 650 MG PO TABS
650.0000 mg | ORAL_TABLET | Freq: Three times a day (TID) | ORAL | Status: DC
  Administered 2019-08-18 – 2019-08-19 (×4): 650 mg via ORAL
  Filled 2019-08-18 (×4): qty 1

## 2019-08-18 MED ORDER — CEFDINIR 300 MG PO CAPS
300.0000 mg | ORAL_CAPSULE | Freq: Two times a day (BID) | ORAL | Status: DC
  Administered 2019-08-18 – 2019-08-19 (×2): 300 mg via ORAL
  Filled 2019-08-18 (×3): qty 1

## 2019-08-18 NOTE — Evaluation (Signed)
Clinical/Bedside Swallow Evaluation Patient Details  Name: [name] Holder MRN: [id number] Date of Birth: 03-23-1956  Today's Date: 08/18/2019 Time: SLP Start Time (ACUTE ONLY): 0750 SLP Stop Time (ACUTE ONLY): 0820 SLP Time Calculation (min) (ACUTE ONLY): 30 min  Past Medical History:  Past Medical History:  Diagnosis Date  . Allergic rhinitis   . At risk for sleep apnea    STOP-BANG SCORE = 5    (routed to pt's pcp 09-08-2018)  . Chronic constipation   . Chronic low back pain   . Chronic pain   . DOE (dyspnea on exertion)   . History of kidney stones   . History of recent pneumonia 08/10/2018   acute bronchopneumonia;  follow-up cxr 08-28-2018 in epic  . History of vertebral compression fracture    T10 and T12 s/p kyphoplasty  . Hx of colonic polyps   . Hyperplasia of prostate with lower urinary tract symptoms (LUTS)   . Hypertension   . Mild persistent asthma    followed by pcp  . Numbness in both legs    secondary to a fall, per pt has had work-up and couldn't find anything  . Osteopenia   . 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 Wears glasses    Past Surgical History:  Past Surgical History:  Procedure Laterality Date  . COLONOSCOPY    . IR KYPHO EA ADDL LEVEL THORACIC OR LUMBAR  08/17/2018  . IR KYPHO EA ADDL LEVEL THORACIC OR LUMBAR  08/26/2018  . IR KYPHO LUMBAR INC FX REDUCE BONE BX UNI/BIL CANNULATION INC/IMAGING  08/26/2018  . IR KYPHO THORACIC WITH BONE BIOPSY  08/17/2018  . IR RADIOLOGIST EVAL & MGMT  08/11/2018  . IR RADIOLOGIST EVAL & MGMT  08/24/2018  . LYMPHADENECTOMY Left 01/05/2013   Procedure: ROBOTIC LYMPHADENECTOMY;  Surgeon: Dutch Gray, MD;  Location: [facility];  Service: Urology;  Laterality: Left;  . PROSTATE BIOPSY     11/12 positive biopsies  . ROTATOR CUFF REPAIR Left 2012  .  TRANSURETHRAL RESECTION OF BLADDER TUMOR N/A 09/15/2018   Procedure: TRANSURETHRAL RESECTION OF BLADDER TUMOR (TURBT)WITH CYSTOSCOPY/ POST OPERATIVE INSTILLATION OF GEMCITABINE;  Surgeon: Raynelle Bring, MD;  Location: [facility];  Service: Urology;  Laterality: N/A;  GENERAL ANESTHESIA WITH PARALYSIS  . UMBILICAL HERNIA REPAIR  09-02-2001   dr t. price @[facility]    HPI:  64 yo male adm to [facility] with confusion, metastatic prostate cancer, compression fx, former smoker.  Pt with ? pna on CXR = LUL and bilateral ATX and infiltrates.  RML ground glass opacities consistent with infiltrate vs infection. Swallow eval ordered. Pt denies dysphagia except to large pills and experiencing some gagging.  He admits to h/o GERD but states once he started Nexium, symptoms ceased.  Pt was lethargic with congested breathing yesterday when SlP attempted therefore deferred evaluation.   Assessment / Plan / Recommendation Clinical Impression  Pt presents with functional oropharyngeal swallow ability without indication of aspiration or dysphagia with all po observed.  Minimal dry cough noted at baseline and during po. Pt presents with no significant increase dyspnea with po intake and appears with reciprocity of breathing/swallow.  3 ounce water test was passed.  Please see HPI for information re: pt's reflux hx.  Educated pt to esophageal, respiratory/swallow and xerostomia compensations.  No SLP follow up indicated. Advised  pt to speak to MD regarding his gagging with intake as he reports taking his PPI did not diminish episodes. SLP Visit Diagnosis: Dysphagia, unspecified (R13.10)    Aspiration Risk  Mild aspiration risk    Diet Recommendation Regular;Thin liquid   Liquid Administration via: Cup;Straw Medication Administration: Whole meds with liquid(advised pt to take with puree if problematic) Compensations: Slow rate;Small sips/bites    Other  Recommendations Oral Care Recommendations: Oral care BID   Follow up  Recommendations None      Frequency and Duration   n/a         Prognosis   n/a     Swallow Study   General HPI: 64 yo male adm to [facility] with confusion, metastatic prostate cancer, compression fx, former smoker.  Pt with ? pna on CXR = LUL and bilateral ATX and infiltrates.  RML ground glass opacities consistent with infiltrate vs infection. Swallow eval ordered. Pt denies dysphagia except to large pills and experiencing some gagging.  He admits to h/o GERD but states once he started Nexium, symptoms ceased.  Pt was lethargic with congested breathing yesterday when SlP attempted therefore deferred evaluation. Type of Study: Bedside Swallow Evaluation Previous Swallow Assessment: pt reports having barium swallow test approx 20 years ago to assess his issues with gagging but reports no source or resolution located Diet Prior to this Study: Regular;Thin liquids Temperature Spikes Noted: Yes Respiratory Status: Room air History of Recent Intubation: No Behavior/Cognition: Alert;Cooperative;Pleasant mood Oral Cavity Assessment: Within Functional Limits Oral Care Completed by SLP: No Oral Cavity - Dentition: Adequate natural dentition Vision: Functional for self-feeding Self-Feeding Abilities: Able to feed self Patient Positioning: Upright in bed Baseline Vocal Quality: Normal Volitional Cough: Strong Volitional Swallow: Able to elicit    Oral/Motor/Sensory Function Overall Oral Motor/Sensory Function: Within functional limits   Ice Chips Ice chips: Not tested   Thin Liquid Thin Liquid: Within functional limits Presentation: Cup;Spoon;Self Fed    Nectar Thick Nectar Thick Liquid: Not tested   Honey Thick Honey Thick Liquid: Not tested   Puree Puree: Not tested   Solid     Solid: Within functional limits Presentation: Self Fed      Macario Golds 08/18/2019,9:22 AM   Kathleen Lime, MS [place] Office [phone]

## 2019-08-18 NOTE — Progress Notes (Addendum)
NAME:  [name] Holder, MRN:  [id number], DOB:  [birth year], LOS: 2 ADMISSION DATE:  08/15/2019, CONSULTATION DATE: 08/15/2019 REFERRING MD: Dr. Lorin Mercy, CHIEF COMPLAINT: Abnormal CT  Brief History   64 year old gentleman admitted for hallucination abnormal CT scan right middle lobe.,  Former smoker  History of present illness   This is a 64 year old gentleman with a past medical history significant for metastatic prostate cancer to his pelvis, history of hypertension hyperlipidemia chronic back pain.  He was recently seen for evaluation of cough and a chest x-ray was completed which revealed a right middle lobe abnormal infiltrate by his primary care provider.  Patient was sent for CT imaging which revealed right middle lobe nodular opacity within this location that matched the chest x-ray.  Patient was started on Bactrim and continued to have back pain trouble falling asleep taking some of his pain medications at home as well.  Patient started to develop hallucinations and was brought to the emergency room for evaluation.  CT scan revealing probable pneumonia however could not exclude malignancy.  Of note, patient is a former smoker quit 1985.  He is a little bit more lucid in the emergency department today.  He is still somewhat confused and difficult to obtain history from.  Most history was obtained from the record as well as discussion with patient's attending physician Dr. Lorin Mercy.  Pulmonary was consulted for recommendations of the abnormal CT scan.  Once evaluated in the ER patient is much more lucid able to give [place] some history about his troubles.  He does admit to taking more of his pain medications that he may should have.  He said he felt a little high or intoxicated.  He then started to hallucinate.  When asked about any other previous drug use history he replied "no further comment"  Past Medical History   Past Medical History:  Diagnosis Date  . Allergic rhinitis   . At risk for sleep  apnea    STOP-BANG SCORE = 5    (routed to pt's pcp 09-08-2018)  . Chronic constipation   . Chronic low back pain   . Chronic pain   . DOE (dyspnea on exertion)   . History of kidney stones   . History of recent pneumonia 08/10/2018   acute bronchopneumonia;  follow-up cxr 08-28-2018 in epic  . History of vertebral compression fracture    T10 and T12 s/p kyphoplasty  . Hx of colonic polyps   . Hyperplasia of prostate with lower urinary tract symptoms (LUTS)   . Hypertension   . Mild persistent asthma    followed by pcp  . Numbness in both legs    secondary to a fall, per pt has had work-up and couldn't find anything  . Osteopenia   . Prostate cancer metastatic to pelvis [facility]) urologist-- dr borden/  oncologist -- dr Alen Blew   dx 2014 advanced prostate cancer with pelvid adenopathy,  Stage T2c, Gleason 7;  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  . Wears glasses      Significant Hospital Events   1/19 Hospital admission. PCCM consult, received 3% saline 1/21 Started steroids for wheezing and low cortisol  Consults:  Pulmonary critical care  Procedures:    Significant Diagnostic Tests:  08/09/2019 CT chest: There is a reticular nodular opacity within the right middle lobe it looks mostly inflammatory however an underlying malignancy is not excluded. The patient's images have been independently reviewed by me.    08/15/2019: CT head: Chronic microvascular disease, no  acute abnormality.  Micro Data:  COVID-19: Negative Flu: Negative  Antimicrobials:     Interim history/subjective:   Much more awake today.  States that he is feeling better Wheezing diminished.  Sodium improved to 130  Objective   Blood pressure 135/78, pulse (!) 102, temperature 98.2 F (36.8 C), temperature source Axillary, resp. rate (!) 26, height 5\' 7"  (1.702 m), weight 96.1 kg, SpO2 96 %.        Intake/Output Summary (Last 24 hours) at 08/18/2019 0846 Last data filed at 08/17/2019 1600 Gross per 24 hour    Intake 49.25 ml  Output 250 ml  Net -200.75 ml   Filed Weights   08/16/19 2313  Weight: 96.1 kg    Examination: Blood pressure 135/78, pulse (!) 102, temperature 98.2 F (36.8 C), temperature source Axillary, resp. rate (!) 26, height 5\' 7"  (1.702 m), weight 96.1 kg, SpO2 96 %. Gen:      No acute distress HEENT:  EOMI, sclera anicteric Neck:     No masses; no thyromegaly Lungs:    Mild exp wheeze CV:         Regular rate and rhythm; no murmurs Abd:      + bowel sounds; soft, non-tender; no palpable masses, no distension Ext:    No edema; adequate peripheral perfusion Skin:      Warm and dry; no rash Neuro: alert and oriented x 3 Psych: normal mood and affect  Resolved Hospital Problem list     Assessment & Plan:  Hyponatremia, SIADH likely in the setting of community-acquired pneumonia Suspect that he may have a component of adrenal insufficiency with low cortisol and borderline ACTH stim test Antibiotics for community-acquired pneumonia Sodium is improving.  Continue monitoring labs.   Salt tablets, fluid restriction.  He does not need anymore hypertonic saline. Continue steroids  Asthma/COPD exacerbation IV Solu-Medrol Dulera, Nebs every 4 hours He will need follow up in pulmonary clinic on discharge for PFTs and management  History of prostate cancer Per primary  Labs   CBC: Recent Labs  Lab 08/15/19 1212 08/16/19 0436  WBC 9.4 7.2  NEUTROABS 6.7  --   HGB 12.8* 12.1*  HCT 35.4* 33.4*  MCV 87.6 88.4  PLT 190 142*    Basic Metabolic Panel: Recent Labs  Lab 08/16/19 1741 08/16/19 1741 08/16/19 1954 08/16/19 1954 08/17/19 0158 08/17/19 0158 08/17/19 0610 08/17/19 0610 08/17/19 0821 08/17/19 1414 08/17/19 2030 08/18/19 0202 08/18/19 0758  NA 124*   < > 124*  123*   < > 127*   < > 124*   < > 126* 127* 130* 130* 130*  K 3.5  --  3.6  --  3.3*  --  3.6  --   --   --   --   --  3.5  CL 92*  --  93*  --  95*  --  95*  --   --   --   --   --  100   CO2 21*  --  21*  --  19*  --  18*  --   --   --   --   --  16*  GLUCOSE 84  --  81  --  72  --  83  --   --   --   --   --  139*  BUN 21  --  22  --  23  --  25*  --   --   --   --   --  18  CREATININE 0.90  --  0.89  --  0.84  --  0.81  --   --   --   --   --  0.65  CALCIUM 8.9  --  8.8*  --  8.7*  --  8.5*  --   --   --   --   --  8.7*   < > = values in this interval not displayed.   GFR: Estimated Creatinine Clearance: 101.7 mL/min (by C-G formula based on SCr of 0.65 mg/dL). Recent Labs  Lab 08/15/19 1212 08/15/19 1213 08/15/19 1413 08/16/19 0436  WBC 9.4  --   --  7.2  LATICACIDVEN  --  3.2* 1.5  --     Liver Function Tests: No results for input(s): AST, ALT, ALKPHOS, BILITOT, PROT, ALBUMIN in the last 168 hours. No results for input(s): LIPASE, AMYLASE in the last 168 hours. No results for input(s): AMMONIA in the last 168 hours.  ABG    Component Value Date/Time   TCO2 27 08/10/2018 1409     Coagulation Profile: No results for input(s): INR, PROTIME in the last 168 hours.  Cardiac Enzymes: No results for input(s): CKTOTAL, CKMB, CKMBINDEX, TROPONINI in the last 168 hours.  HbA1C: Hgb A1c MFr Bld  Date/Time Value Ref Range Status  11/25/2018 11:05 AM 5.1 4.6 - 6.5 % Final    Comment:    Glycemic Control Guidelines for People with Diabetes:Non Diabetic:  <6%Goal of Therapy: <7%Additional Action Suggested:  >8%   09/02/2017 09:15 AM 5.7 4.6 - 6.5 % Final    Comment:    Glycemic Control Guidelines for People with Diabetes:Non Diabetic:  <6%Goal of Therapy: <7%Additional Action Suggested:  >8%     CBG: No results for input(s): GLUCAP in the last 168 hours.  Marshell Garfinkel MD [facility] Please see [email or web address] for pager details.  08/18/2019, 8:46 AM

## 2019-08-18 NOTE — Progress Notes (Signed)
PROGRESS NOTE    [name] Holder  [id number] DOB: 1955-12-04 DOA: 08/15/2019 PCP: [name], MD   Brief Narrative: Patient is a 64 year old male with history of metastatic posterior cancer to the pelvis, hypertension, BPH, chronic back pain who presented with confusion, hallucination.  He was seen by his PCP on 1/12 and chest x-ray showed possible developing right middle lung infiltrate and he was started on antibiotics, Bactrim, and was scheduled for CT chest for further evaluation.  He reported that he started feeling poor, sleeping all the time, had terrible back pain.  He was also short of breath for last couple of weeks.  On presentation he was found to be severely confused.  Had AKI, severe hyponatremia, lactate of 3.2.  He was started on antibiotics for possible PNA.  PCCM consulted.  Started on 3% saline for hyponatremia.  Admitted to stepdown.  1/22:Hyponatremia has improved and so has mental status.  This morning he was alert and oriented.  He will be transferred to the telemetry floor.  Physical therapy evaluation requested.  Consider discharging him tomorrow depending upon PT assessment, sodium level in the morning.We will change abx to oral.  Assessment & Plan:   Principal Problem:   Dehydration with hyponatremia Active Problems:   Essential hypertension   Hyperlipidemia with target LDL less than 100   Secondary malignant neoplasm of bone and bone marrow (HCC)   AKI (acute kidney injury) ([place])   Lesion of right lung   AMS (altered mental status)   Hyponatremia: Presented with sodium of 120.[name] Holder  Uric acid low, cortisol, TSH normal.  Urine sodium was 164.  Picture suggestive of SIADH.  Presented with confusion so started on 3% saline.  He had altered mental status associated with hyponatremia in the past as well.   PCCM was following .Off 3% saline now. There was suspicion for adrenal insufficiency, but normal  ACTH stimulation test.   Started on Lasix 20 mg and salt  tablets.  I will recommend to continue these regimen for at least a week, check BMP in a week as an outpatient.  Altered mental status: Most likely associated with severe hyponatremia.  Polypharmacy could also have contributed.  CT head did not show any acute intracranial abnormalities.  This morning he was alert and oriented.  Suspected PNA: CXR showed Left upper lung and bibasilar subsegmental atelectasis and pulmonary infiltrates . CT chest showed Mixed reticulonodular opacities and small amount of interspersed ground-glass present in the right middle lobe  compatible with an acute infectious or inflammatory process. Non-contrast chest CT at 3-6 months is recommended. Started on azithromycin, ceftriaxone.  We have changed antibiotics to oral.  Asthma exacerbation:Started on IV  steroids for wheezing.  Continue bronchodilators.  Consider stopping IV steroid to oral tomorrow morning.  He was still wheezing this morning so I will continue IV steroids for today.  Metastatic prostate cancer: PSA of 21, biopsies positive.  Status post prostatectomy.  On Zytiga ,prednisone and Lupron at home.  History of compression fractures, had kyphoplasty without improvement.  Follow-up with urology/oncology as an outpatient.He follows with Dr Alen Blew and urology.  Hypertension: On olmesartan and carvedilol at home.  Blood pressure currently stable.  Currently these medications are on hold .  Can resume on discharge if becomes hypertensive  We have requested for physical therapy evaluation today.          DVT prophylaxis: Lovenox Code Status: Partial Family Communication: Talked to wife on phone on 08/18/19 Disposition Plan: Patient  is from home.  Needs PT evaluation before discharge .  Not ready for discharge because he is still wheezing today and mental status has just returned to baseline today.  Needs further monitoring for at least 24 hours.  Check sodium level tomorrow.  Consultants:  PCCM  Procedures: None  Antimicrobials:  Anti-infectives (From admission, onward)   Start     Dose/Rate Route Frequency Ordered Stop   08/17/19 1200  azithromycin (ZITHROMAX) tablet 500 mg     500 mg Oral Daily 08/17/19 1106     08/15/19 2030  azithromycin (ZITHROMAX) 500 mg in sodium chloride 0.9 % 250 mL IVPB  Status:  Discontinued     500 mg 250 mL/hr over 60 Minutes Intravenous Every 24 hours 08/15/19 1912 08/17/19 1106   08/15/19 2000  cefTRIAXone (ROCEPHIN) 2 g in sodium chloride 0.9 % 100 mL IVPB     2 g 200 mL/hr over 30 Minutes Intravenous Every 24 hours 08/15/19 1912        Subjective: Patient seen and examined the bedside this morning.  Hemodynamically stable.  His mental status has returned to baseline and he is completely alert and oriented today.  He was very confused yesterday.  He still has some mild expiratory wheezes.  Denies any dyspnea at the.  Saturating fine on room air.  Objective: Vitals:   08/18/19 0752 08/18/19 0800 08/18/19 0900 08/18/19 1000  BP:  128/82 118/63 (!) 147/55  Pulse:  99 (!) 102 (!) 103  Resp:  17 (!) 27 (!) 27  Temp:  98.2 F (36.8 C)    TempSrc:  Axillary    SpO2: 96% 100% 99% 97%  Weight:      Height:        Intake/Output Summary (Last 24 hours) at 08/18/2019 1113 Last data filed at 08/17/2019 1600 Gross per 24 hour  Intake 49.25 ml  Output 250 ml  Net -200.75 ml   Filed Weights   08/16/19 2313  Weight: 96.1 kg    Examination:   General exam: Not in distress Respiratory system: Bilateral expiratory wheezes, no crackles Cardiovascular system: S1 & S2 heard, sinus tachycardia gastrointestinal system: Abdomen is nondistended, soft and nontender. Central nervous system: Alert and oriented. No focal neurological deficits. Extremities: No edema, no clubbing ,no cyanosis, Skin: No rashes, lesions or ulcers,no icterus ,no pallor    Data Reviewed: I have personally reviewed following labs and imaging studies  CBC: Recent  Labs  Lab 08/15/19 1212 08/16/19 0436  WBC 9.4 7.2  NEUTROABS 6.7  --   HGB 12.8* 12.1*  HCT 35.4* 33.4*  MCV 87.6 88.4  PLT 190 [id number]*   Basic Metabolic Panel: Recent Labs  Lab 08/16/19 1741 08/16/19 1741 08/16/19 1954 08/16/19 1954 08/17/19 0158 08/17/19 0158 08/17/19 0610 08/17/19 0610 08/17/19 0821 08/17/19 1414 08/17/19 2030 08/18/19 0202 08/18/19 0758  NA 124*   < > 124*  123*   < > 127*   < > 124*   < > 126* 127* 130* 130* 130*  K 3.5  --  3.6  --  3.3*  --  3.6  --   --   --   --   --  3.5  CL 92*  --  93*  --  95*  --  95*  --   --   --   --   --  100  CO2 21*  --  21*  --  19*  --  18*  --   --   --   --   --  16*  GLUCOSE 84  --  81  --  72  --  83  --   --   --   --   --  139*  BUN 21  --  22  --  23  --  25*  --   --   --   --   --  18  CREATININE 0.90  --  0.89  --  0.84  --  0.81  --   --   --   --   --  0.65  CALCIUM 8.9  --  8.8*  --  8.7*  --  8.5*  --   --   --   --   --  8.7*   < > = values in this interval not displayed.   GFR: Estimated Creatinine Clearance: 101.7 mL/min (by C-G formula based on SCr of 0.65 mg/dL). Liver Function Tests: No results for input(s): AST, ALT, ALKPHOS, BILITOT, PROT, ALBUMIN in the last 168 hours. No results for input(s): LIPASE, AMYLASE in the last 168 hours. No results for input(s): AMMONIA in the last 168 hours. Coagulation Profile: No results for input(s): INR, PROTIME in the last 168 hours. Cardiac Enzymes: No results for input(s): CKTOTAL, CKMB, CKMBINDEX, TROPONINI in the last 168 hours. BNP (last 3 results) No results for input(s): PROBNP in the last 8760 hours. HbA1C: No results for input(s): HGBA1C in the last 72 hours. CBG: No results for input(s): GLUCAP in the last 168 hours. Lipid Profile: No results for input(s): CHOL, HDL, LDLCALC, TRIG, CHOLHDL, LDLDIRECT in the last 72 hours. Thyroid Function Tests: Recent Labs    08/16/19 0909  TSH 1.616   Anemia Panel: No results for input(s):  VITAMINB12, FOLATE, FERRITIN, TIBC, IRON, RETICCTPCT in the last 72 hours. Sepsis Labs: Recent Labs  Lab 08/15/19 1213 08/15/19 1413  LATICACIDVEN 3.2* 1.5    Recent Results (from the past 240 hour(s))  Respiratory Panel by RT PCR (Flu A&B, Covid) - Nasopharyngeal Swab     Status: None   Collection Time: 08/15/19  2:09 PM   Specimen: Nasopharyngeal Swab  Result Value Ref Range Status   SARS Coronavirus 2 by RT PCR NEGATIVE NEGATIVE Final    Comment: (NOTE) SARS-CoV-2 target nucleic acids are NOT DETECTED. The SARS-CoV-2 RNA is generally detectable in upper respiratoy specimens during the acute phase of infection. The lowest concentration of SARS-CoV-2 viral copies this assay can detect is 131 copies/mL. A negative result does not preclude SARS-Cov-2 infection and should not be used as the sole basis for treatment or other patient management decisions. A negative result may occur with  improper specimen collection/handling, submission of specimen other than nasopharyngeal swab, presence of viral mutation(s) within the areas targeted by this assay, and inadequate number of viral copies (<131 copies/mL). A negative result must be combined with clinical observations, patient history, and epidemiological information. The expected result is Negative. Fact Sheet for Patients:  PinkCheek.be Fact Sheet for Healthcare Providers:  GravelBags.it This test is not yet ap proved or cleared by the [place] FDA and  has been authorized for detection and/or diagnosis of SARS-CoV-2 by FDA under an Emergency Use Authorization (EUA). This EUA will remain  in effect (meaning this test can be used) for the duration of the COVID-19 declaration under Section 564(b)(1) of the Act, 21 U.S.C. section 360bbb-3(b)(1), unless the authorization is terminated or revoked sooner.    Influenza A by PCR NEGATIVE NEGATIVE Final   Influenza B by PCR  NEGATIVE NEGATIVE Final    Comment: (NOTE) The Xpert Xpress SARS-CoV-2/FLU/RSV assay is intended as an aid in  the diagnosis of influenza from Nasopharyngeal swab specimens and  should not be used as a sole basis for treatment. Nasal washings and  aspirates are unacceptable for Xpert Xpress SARS-CoV-2/FLU/RSV  testing. Fact Sheet for Patients: PinkCheek.be Fact Sheet for Healthcare Providers: GravelBags.it This test is not yet approved or cleared by the [place] FDA and  has been authorized for detection and/or diagnosis of SARS-CoV-2 by  FDA under an Emergency Use Authorization (EUA). This EUA will remain  in effect (meaning this test can be used) for the duration of the  Covid-19 declaration under Section 564(b)(1) of the Act, 21  U.S.C. section 360bbb-3(b)(1), unless the authorization is  terminated or revoked. Performed at [facility], [place] [address], [place], [place] [address]   MRSA PCR Screening     Status: None   Collection Time: 08/16/19 10:30 AM   Specimen: Nasopharyngeal  Result Value Ref Range Status   MRSA by PCR NEGATIVE NEGATIVE Final    Comment:        The GeneXpert MRSA Assay (FDA approved for NASAL specimens only), is one component of a comprehensive MRSA colonization surveillance program. It is not intended to diagnose MRSA infection nor to guide or monitor treatment for MRSA infections. Performed at [facility], [place] [address], [place], [place] [address]          Radiology Studies: No results found.      Scheduled Meds: . azithromycin  500 mg Oral Daily  . Chlorhexidine Gluconate Cloth  6 each Topical Daily  . darifenacin  7.5 mg Oral Daily  . docusate sodium  100 mg Oral BID  . enoxaparin (LOVENOX) injection  40 mg Subcutaneous Q24H  . fluticasone  2 spray Each Nare Daily  . furosemide  20 mg Oral Daily  . ipratropium-albuterol  3 mL  Nebulization Q6H  . mouth rinse  15 mL Mouth Rinse BID  . methylPREDNISolone (SOLU-MEDROL) injection  40 mg Intravenous Q12H  . mometasone-formoterol  2 puff Inhalation BID  . polyethylene glycol  17 g Oral Daily  . sodium bicarbonate  650 mg Oral TID  . sodium chloride flush  3 mL Intravenous Q12H  . sodium chloride  2 g Oral BID WC   Continuous Infusions: . cefTRIAXone (ROCEPHIN)  IV Stopped (08/17/19 2030)     LOS: 2 days    Time spent: 35 mins.More than 50% of that time was spent in counseling and/or coordination of care.      Shelly Coss, MD [facility] Pager [phone]  If 7PM-7AM, please contact night-coverage [email or web address] Password [facility] 08/18/2019, 11:13 AM

## 2019-08-18 NOTE — TOC Initial Note (Signed)
Transition of Care [facility]) - Initial/Assessment Note    Patient Details  Name: [name] Holder MRN: [id number] Date of Birth: 09-Aug-1955  Transition of Care [facility]) CM/SW Contact:    Dessa Phi, RN Phone Number: 08/18/2019, 1:57 PM  Clinical Narrative: PT cons await recc.                  Expected Discharge Plan: Home/Self Care Barriers to Discharge: Continued Medical Work up   Patient Goals and CMS Choice Patient states their goals for this hospitalization and ongoing recovery are:: go home      Expected Discharge Plan and Services Expected Discharge Plan: Home/Self Care   Discharge Planning Services: CM Consult   Living arrangements for the past 2 months: Single Family Home                                      Prior Living Arrangements/Services Living arrangements for the past 2 months: Single Family Home Lives with:: Spouse Patient language and need for interpreter reviewed:: Yes Do you feel safe going back to the place where you live?: Yes      Need for Family Participation in Patient Care: No (Comment) Care giver support system in place?: Yes (comment)   Criminal Activity/Legal Involvement Pertinent to Current Situation/Hospitalization: No - Comment as needed  Activities of Daily Living Home Assistive Devices/Equipment: Hospital bed, Nebulizer, Environmental consultant (specify type), Bedside commode/3-in-1, Eyeglasses(front wheeled walker) ADL Screening (condition at time of admission) Patient's cognitive ability adequate to safely complete daily activities?: No Is the patient deaf or have difficulty hearing?: No Does the patient have difficulty seeing, even when wearing glasses/contacts?: No Does the patient have difficulty concentrating, remembering, or making decisions?: Yes(having some memory issues) Patient able to express need for assistance with ADLs?: Yes Does the patient have difficulty dressing or bathing?: Yes Independently performs ADLs?:  No Communication: Independent Dressing (OT): Needs assistance Is this a change from baseline?: Change from baseline, expected to last >3 days Grooming: Needs assistance Is this a change from baseline?: Change from baseline, expected to last >3 days Feeding: Needs assistance Is this a change from baseline?: Change from baseline, expected to last >3 days Bathing: Needs assistance Is this a change from baseline?: Change from baseline, expected to last >3 days Toileting: Needs assistance Is this a change from baseline?: Change from baseline, expected to last >3days In/Out Bed: Needs assistance Is this a change from baseline?: Change from baseline, expected to last >3 days Walks in Home: Needs assistance Is this a change from baseline?: Change from baseline, expected to last >3 days Does the patient have difficulty walking or climbing stairs?: Yes(secondary to weakness) Weakness of Legs: Both Weakness of Arms/Hands: None  Permission Sought/Granted Permission sought to share information with : Case Manager Permission granted to share information with : Yes, Verbal Permission Granted  Share Information with NAME: Case manager Juliann Pulse        Permission granted to share info w Contact Information: Madeline Rentsch spouse [phone]  Emotional Assessment Appearance:: Appears stated age Attitude/Demeanor/Rapport: Gracious Affect (typically observed): Accepting Orientation: : Oriented to Self, Oriented to Place, Oriented to  Time, Oriented to Situation Alcohol / Substance Use: Not Applicable Psych Involvement: No (comment)  Admission diagnosis:  Hallucinations [R44.3] Hyponatremia [E87.1] Dehydration with hyponatremia [E86.0, E87.1] AMS (altered mental status) [R41.82] Patient Active Problem List   Diagnosis Date Noted  . AMS (altered  mental status) 08/16/2019  . Dehydration with hyponatremia 08/15/2019  . Lesion of right lung 08/09/2019  . Pneumonia of both lungs due to Pneumocystis  jirovecii ([place]) 08/09/2019  . Anemia, chronic disease 04/20/2019  . AKI (acute kidney injury) ([place]) 12/28/2018  . Hyperglycemia 11/24/2018  . Pathologic compression fracture of spine, initial encounter ([place]) 09/13/2018  . Cancer associated pain 09/13/2018  . Bladder cancer ([place]) 09/13/2018  . Secondary malignant neoplasm of bone and bone marrow ([place]) 08/06/2018  . Spinal stenosis in cervical region 04/12/2018  . Routine general medical examination at a health care facility 08/29/2015  . Hyperlipidemia with target LDL less than 100 12/21/2013  . Essential hypertension 05/20/2010  . Allergic rhinitis 05/20/2010  . Asthma, mild intermittent 05/20/2010   PCP:  [name], MD Pharmacy:   [place], [address] [place] [address] Phone: [phone] Fax: [phone]     Social Determinants of Health (SDOH) Interventions    Readmission Risk Interventions Readmission Risk Prevention Plan 08/18/2019  Transportation Screening Complete  Medication Review ([place]) Complete  PCP or Specialist appointment within 3-5 days of discharge Complete  HRI or [place] Complete  SW Recovery Care/Counseling Consult Complete  [place] Not Applicable  Some recent data might be hidden

## 2019-08-18 NOTE — Plan of Care (Signed)

## 2019-08-19 LAB — BASIC METABOLIC PANEL
Anion gap: 10 (ref 5–15)
BUN: 17 mg/dL (ref 8–23)
CO2: 23 mmol/L (ref 22–32)
Calcium: 9.2 mg/dL (ref 8.9–10.3)
Chloride: 98 mmol/L (ref 98–111)
Creatinine, Ser: 0.63 mg/dL (ref 0.61–1.24)
GFR calc Af Amer: >60 mL/min (ref >60)
GFR calc non Af Amer: >60 mL/min (ref >60)
Glucose, Bld: 143 mg/dL — ABNORMAL HIGH (ref 70–99)
Potassium: 3.5 mmol/L (ref 3.5–5.1)
Sodium: 131 mmol/L — ABNORMAL LOW (ref 135–145)

## 2019-08-19 MED ORDER — CEFDINIR 300 MG PO CAPS: 300.0000 mg | ORAL_CAPSULE | Freq: Two times a day (BID) | ORAL | 0 refills | Status: DC

## 2019-08-19 MED ORDER — AZITHROMYCIN 250 MG PO TABS: 250.0000 mg | ORAL_TABLET | Freq: Every day | ORAL | 0 refills | Status: DC

## 2019-08-19 MED ORDER — AZITHROMYCIN 250 MG PO TABS: 250.0000 mg | ORAL_TABLET | Freq: Every day | ORAL | 0 refills | Status: AC

## 2019-08-19 MED ORDER — FUROSEMIDE 20 MG PO TABS: 20.0000 mg | ORAL_TABLET | Freq: Every day | ORAL | 0 refills | Status: DC

## 2019-08-19 MED ORDER — CEFDINIR 300 MG PO CAPS: 300.0000 mg | ORAL_CAPSULE | Freq: Two times a day (BID) | ORAL | 0 refills | Status: AC

## 2019-08-19 MED ORDER — SODIUM CHLORIDE 1 G PO TABS: 1.0000 g | ORAL_TABLET | Freq: Two times a day (BID) | ORAL | 0 refills | Status: AC

## 2019-08-19 MED ORDER — GUAIFENESIN ER 600 MG PO TB12
600.0000 mg | ORAL_TABLET | Freq: Two times a day (BID) | ORAL | Status: DC | PRN
  Administered 2019-08-19: 600 mg via ORAL
  Filled 2019-08-19: qty 1

## 2019-08-19 MED ORDER — SODIUM CHLORIDE 1 G PO TABS: 1.0000 g | ORAL_TABLET | Freq: Two times a day (BID) | ORAL | 0 refills | Status: DC

## 2019-08-19 NOTE — Evaluation (Signed)
Physical Therapy Evaluation Patient Details Name: [name] Holder MRN: [id number] DOB: April 02, 1956 Today's Date: 08/19/2019   History of Present Illness  Pt is 64 year old male with history of metastatic posterior cancer to the pelvis, hypertension, BPH, chronic back pain who presented with confusion, hallucination.  Pt was admitted with dehydration with hyponatremia.  Clinical Impression  Pt admitted with above diagnosis. Pt able to ambulate 28' with RW and min guard.  He does present with decreased strength and easily fatigued.  Pt has good support at home and necessary DME.  Pt demonstrates mobility to return home with family 24 hr (near 24 hr) supervision but will benefit from PT while hospitalized to advance. Pt currently with functional limitations due to the deficits listed below (see PT Problem List). Pt will benefit from skilled PT to increase their independence and safety with mobility to allow discharge to the venue listed below.       Follow Up Recommendations Home health PT;Outpatient PT(pt wants to discuss with wife to see if she can take him to outpt otherwise HHPT)    Equipment Recommendations  None recommended by PT    Recommendations for Other Services       Precautions / Restrictions Precautions Precautions: Fall      Mobility  Bed Mobility Overal bed mobility: Needs Assistance Bed Mobility: Supine to Sit;Sit to Supine     Supine to sit: Min guard Sit to supine: Min guard   General bed mobility comments: increased time  Transfers Overall transfer level: Needs assistance Equipment used: Rolling walker (2 wheeled) Transfers: Sit to/from [facility] Sit to Stand: Min guard Stand pivot transfers: Min guard       General transfer comment: min guard for safety; assist with toileting ADLs  Ambulation/Gait Ambulation/Gait assistance: Min guard Gait Distance (Feet): 80 Feet Assistive device: Rolling walker (2 wheeled) Gait  Pattern/deviations: Step-through pattern;Decreased stride length Gait velocity: decreased   General Gait Details: Pt reports fatigue and with 2/4 on dyspnea scale; cued for RW use  Stairs            Wheelchair Mobility    Modified Rankin (Stroke Patients Only)       Balance Overall balance assessment: Needs assistance Sitting-balance support: No upper extremity supported;Feet supported Sitting balance-Leahy Scale: Good     Standing balance support: Bilateral upper extremity supported;During functional activity Standing balance-Leahy Scale: Fair                               Pertinent Vitals/Pain Pain Assessment: 0-10 Pain Score: 4  Pain Location: back Pain Intervention(s): Limited activity within patient's tolerance;Repositioned    Home Living Family/patient expects to be discharged to:: Private residence Living Arrangements: Spouse/significant other Available Help at Discharge: Family;Available 24 hours/day(wife and sister in law with him most of the time) Type of Home: House Home Access: Stairs to enter Entrance Stairs-Rails: Right;Left;Can reach both Entrance Stairs-Number of Steps: 4 Home Layout: One level Home Equipment: Toilet riser;Walker - 2 wheels;Bedside commode;Wheelchair - manual;Hospital bed      Prior Function Level of Independence: Needs assistance   Gait / Transfers Assistance Needed: Reports use of RW; had steadying with walking; household ambulation; reports increased weakness past couple of weeks  ADL's / Homemaking Assistance Needed: Could dress and toilet but needed assistance with showers.        Hand Dominance        Extremity/Trunk Assessment   Upper Extremity Assessment  Upper Extremity Assessment: Overall WFL for tasks assessed    Lower Extremity Assessment Lower Extremity Assessment: LLE deficits/detail;RLE deficits/detail RLE Deficits / Details: ROM WFL; Strength 4/5 LLE Deficits / Details: ROM WFL; Strength  4/5    Cervical / Trunk Assessment Cervical / Trunk Assessment: Normal  Communication   Communication: No difficulties  Cognition Arousal/Alertness: Awake/alert Behavior During Therapy: WFL for tasks assessed/performed Overall Cognitive Status: Within Functional Limits for tasks assessed                                        General Comments General comments (skin integrity, edema, etc.): O2 sats 99% rest and activity on RA; HR 90 bpm rest and up to 103 bpm walk    Exercises     Assessment/Plan    PT Assessment Patient needs continued PT services  PT Problem List Decreased strength;Decreased mobility;Decreased range of motion;Decreased activity tolerance;Cardiopulmonary status limiting activity;Decreased balance;Decreased knowledge of use of DME       PT Treatment Interventions DME instruction;Therapeutic activities;Gait training;Therapeutic exercise;Patient/family education;Stair training;Balance training;Functional mobility training    PT Goals (Current goals can be found in the Care Plan section)  Acute Rehab PT Goals Patient Stated Goal: return home PT Goal Formulation: With patient Time For Goal Achievement: 09/02/19 Potential to Achieve Goals: Good    Frequency Min 3X/week   Barriers to discharge        Co-evaluation               AM-PAC PT "6 Clicks" Mobility  Outcome Measure Help needed turning from your back to your side while in a flat bed without using bedrails?: None Help needed moving from lying on your back to sitting on the side of a flat bed without using bedrails?: None Help needed moving to and from a bed to a chair (including a wheelchair)?: None Help needed standing up from a chair using your arms (e.g., wheelchair or bedside chair)?: None Help needed to walk in hospital room?: None Help needed climbing 3-5 steps with a railing? : A Little 6 Click Score: 23    End of Session Equipment Utilized During Treatment: Gait  belt Activity Tolerance: Patient tolerated treatment well Patient left: with chair alarm set;in chair;with call bell/phone within reach Nurse Communication: Mobility status PT Visit Diagnosis: Other abnormalities of gait and mobility (R26.89);Muscle weakness (generalized) (M62.81)    Time: [id number] PT Time Calculation (min) (ACUTE ONLY): 36 min   Charges:   PT Evaluation $PT Eval Low Complexity: 1 Low PT Treatments $Gait Training: 8-22 mins        Maggie Font, PT Acute Rehab Services Pager [phone] [facility] [phone] [place] [phone]   Karlton Lemon 08/19/2019, 10:31 AM

## 2019-08-19 NOTE — TOC Progression Note (Signed)
Transition of Care [facility]) - Progression Note    Patient Details  Name: [name] Holder MRN: [id number] Date of Birth: Aug 12, 1955  [facility]) CM/SW Contact  Joaquin Courts, RN Phone Number: 08/19/2019, 2:00 PM  Clinical Narrative: Patient declines home health services at this time.       Expected Discharge Plan: Home/Self Care Barriers to Discharge: Continued Medical Work up  Expected Discharge Plan and Services Expected Discharge Plan: Home/Self Care   Discharge Planning Services: CM Consult   Living arrangements for the past 2 months: Single Family Home Expected Discharge Date: 08/19/19                                     Social Determinants of Health (SDOH) Interventions    Readmission Risk Interventions Readmission Risk Prevention Plan 08/18/2019  Transportation Screening Complete  Medication Review Press photographer) Complete  PCP or Specialist appointment within 3-5 days of discharge Complete  HRI or [place] Complete  SW Recovery Care/Counseling Consult Complete  [place] Not Applicable  Some recent data might be hidden

## 2019-08-19 NOTE — Discharge Summary (Signed)
Physician Discharge Summary  Patient ID: [name] Holder MRN: [id number] DOB/AGE: January 17, 1956 64 y.o.  Admit date: 08/15/2019 Discharge date: 08/19/2019  Admission Diagnoses:  Discharge Diagnoses:  Principal Problem:   Dehydration with hyponatremia Active Problems:   Essential hypertension   Hyperlipidemia with target LDL less than 100   Secondary malignant neoplasm of bone and bone marrow (HCC)   AKI (acute kidney injury) ([place])   Lesion of right lung   AMS (altered mental status)   Discharged Condition: stable  Hospital Course: Patient is a 64 year old male with past medical history significant for metastatic prostate cancer with metastasis to the pelvis, chronic hyponatremia, hypertension, BPH and chronic back pain.  Patient presented with 2-week history of shortness of breath, confusion and hallucination.  On presentation, patient endorsed feeling poorly, and sleeping all the time with worsening back pain.  Patient was seen by the primary care provider on 08/08/2019 and chest x-ray done revealed possible developing right middle lung infiltrate and patient was started on antibiotics, Bactrim.  Patient was scheduled to have CT chest for further evaluation.  Work-up done revealed worsening hyponatremia (sodium of 120), acute kidney injury with lactate of 3.2.  Patient was admitted for further assessment and management.  Patient was started on antibiotics for possible pneumonia.  Patient was  initially managed with 3% saline for hyponatremia.  Hyponatremia improved to 131 prior to discharge.  Altered mentation resolved.  Patient's antibiotics was transitioned to oral prior to discharge.  Hyponatremia: - Presented with sodium of 120. -Patient has chronic hyponatremia, likely SIADH. -Low sodium may have been worsened by the Bactrim. -She was confused on presentation.  Patient was initially managed with 3% saline. -Renal function and electrolytes were monitored during the hospital  stay. -Confusion has resolved. -Sodium level is back to baseline. -Patient will be discharged back home to the care of the primary care provider. -PCP will continue to monitor renal function and electrolytes.  Acute kidney injury has also resolved.  Altered mental status: -Possibly multifactorial.   -Patient has severe hyponatremia.  Polypharmacy was also noted.   -CT head did not show any acute intracranial abnormalities.   -Altered mental status resolved prior to discharge.    Suspected PNA:  -CXR showed Left upper lung and bibasilar subsegmental atelectasis and pulmonary infiltrates . -CT chest showed Mixed reticulonodular opacities and small amount of interspersed ground-glass present in the right middle lobe compatible with an acute infectious or inflammatory process. -Repeating non-contrast chest CT in 3-6 months is recommended. -Patient was treated with IV azithromycin and ceftriaxone during the hospital stay.  -Antibiotics was transitioned to oral azithromycin and Omnicef on discharge.    Asthma exacerbation: -Started on IV  steroids for wheezing.   -Bronchodilators were continued.   -Asthma symptoms have resolved.    Metastatic prostate cancer:  -PSA of 21, biopsies positive.  Status post prostatectomy.   -On Zytiga, prednisone and Lupron at home.   -History of compression fractures, had kyphoplasty without improvement.   -Follow-up with urology/oncology as an outpatient.  Hypertension:  -Stable.   -Olmesartan and carvedilol were continued on discharge. -Continue to monitor blood pressure closely.  Consults: pulmonary/intensive care  Significant Diagnostic Studies:  Sodium was 120 on presentation.  Discharge Exam: Blood pressure 138/74, pulse 95, temperature 99 F (37.2 C), temperature source Oral, resp. rate 19, height '5\' 7"'$  (1.702 m), weight 96.1 kg, SpO2 100 %.   Disposition: Discharge disposition: 01-Home or Self Care   Discharge Instructions    Diet  -  low sodium heart healthy   Complete by: As directed    Increase activity slowly   Complete by: As directed      Allergies as of 08/19/2019      Reactions   Amlodipine Swelling   edema   Tetracycline Rash      Medication List    STOP taking these medications   indapamide 1.25 MG tablet Commonly known as: LOZOL     TAKE these medications   abiraterone acetate 250 MG tablet Commonly known as: ZYTIGA Take 1,000 mg by mouth daily.   acetaminophen 325 MG tablet Commonly known as: TYLENOL Take 325 mg by mouth daily as needed for fever.   albuterol 108 (90 Base) MCG/ACT inhaler Commonly known as: ProAir HFA 2 PUFFS EVERY 6 HOURS AS NEEDED FOR WHEEZING What changed:   how much to take  how to take this  when to take this  reasons to take this   albuterol (2.5 MG/3ML) 0.083% nebulizer solution Commonly known as: PROVENTIL NEBULIZE 1 VIAL EVERY 6-8 HOURS AS NEEDED FOR WHEEZING OR SHORTNESS OF BREATH What changed: See the new instructions.   azelastine 0.1 % nasal spray Commonly known as: ASTELIN Place 2 sprays into both nostrils every morning. What changed:   when to take this  reasons to take this   azithromycin 250 MG tablet Commonly known as: Zithromax Z-Pak Take 1 tablet (250 mg total) by mouth daily for 2 days. Take 2 tablets (500 mg) on  Day 1,  followed by 1 tablet (250 mg) once daily on Days 2 through 5.   b complex vitamins tablet Take 1 tablet by mouth daily.   CALCIUM-VITAMIN D PO Take 1 tablet by mouth 2 (two) times daily.   carvedilol 3.125 MG tablet Commonly known as: COREG Take 2 tablets (6.25 mg total) by mouth 2 (two) times daily with a meal.   cefdinir 300 MG capsule Commonly known as: OMNICEF Take 1 capsule (300 mg total) by mouth every 12 (twelve) hours for 2 days.   CENTRUM SILVER 50+MEN PO Take 1 tablet by mouth daily.   furosemide 20 MG tablet Commonly known as: LASIX Take 1 tablet (20 mg total) by mouth daily. Start taking  on: August 20, 2019   ipratropium-albuterol 0.5-2.5 (3) MG/3ML Soln Commonly known as: DUONEB Take 3 mLs by nebulization every 4 (four) hours as needed. What changed: reasons to take this   LUPRON IJ Inject 1 Dose as directed every 4 (four) months. This is administered by [facility]   mometasone 50 MCG/ACT nasal spray Commonly known as: NASONEX USE 2 SPRAYS IN EACH NOSTRIL ONCE DAILY. What changed: how to take this   Narcan 4 MG/0.1ML Liqd nasal spray kit Generic drug: naloxone Place 0.4 mg into the nose once as needed (overdose).   olmesartan 20 MG tablet Commonly known as: BENICAR Take 1 tablet (20 mg total) by mouth 2 (two) times daily.   oxyCODONE 5 MG immediate release tablet Commonly known as: Oxy IR/ROXICODONE Take 1 tablet (5 mg total) by mouth every 4 (four) hours as needed for severe pain. What changed: how much to take   predniSONE 5 MG tablet Commonly known as: DELTASONE Take 1 tablet (5 mg total) by mouth daily with breakfast. What changed: when to take this   sodium chloride 1 g tablet Take 1 tablet (1 g total) by mouth 2 (two) times daily with a meal for 14 days.   solifenacin 5 MG tablet Commonly known as: VESICARE Take  5 mg by mouth daily.   Symbicort 160-4.5 MCG/ACT inhaler Generic drug: budesonide-formoterol Inhale 2 puffs into the lungs 2 (two) times daily. What changed: See the new instructions.   SYRINGE-NEEDLE (DISP) 3 ML 3 ML Misc Use to inject b12 monthly.      Follow-up Information    primary care physician. Schedule an appointment as soon as possible for a visit.           SignedBonnell Public 08/19/2019, 1:16 PM

## 2019-08-24 ENCOUNTER — Other Ambulatory Visit: Payer: Self-pay | Admitting: Internal Medicine

## 2019-08-24 DIAGNOSIS — Z9109 Other allergy status, other than to drugs and biological substances: Secondary | ICD-10-CM

## 2019-08-24 DIAGNOSIS — Z0000 Encounter for general adult medical examination without abnormal findings: Secondary | ICD-10-CM

## 2019-08-31 ENCOUNTER — Other Ambulatory Visit: Payer: Self-pay

## 2019-08-31 ENCOUNTER — Ambulatory Visit: Payer: Medicare Other | Admitting: Pulmonary Disease

## 2019-08-31 ENCOUNTER — Encounter: Payer: Self-pay | Admitting: Pulmonary Disease

## 2019-08-31 ENCOUNTER — Ambulatory Visit (INDEPENDENT_AMBULATORY_CARE_PROVIDER_SITE_OTHER): Payer: Medicare Other | Admitting: Internal Medicine

## 2019-08-31 ENCOUNTER — Encounter: Payer: Self-pay | Admitting: Internal Medicine

## 2019-08-31 VITALS — BP 120/80 | HR 83 | Temp 97.0°F | Ht 68.0 in | Wt 203.2 lb

## 2019-08-31 VITALS — BP 118/72 | HR 84 | Temp 98.0°F | Resp 16 | Ht 67.0 in | Wt 203.0 lb

## 2019-08-31 DIAGNOSIS — D649 Anemia, unspecified: Secondary | ICD-10-CM | POA: Diagnosis not present

## 2019-08-31 DIAGNOSIS — Z8701 Personal history of pneumonia (recurrent): Secondary | ICD-10-CM | POA: Diagnosis not present

## 2019-08-31 DIAGNOSIS — Z87891 Personal history of nicotine dependence: Secondary | ICD-10-CM | POA: Diagnosis not present

## 2019-08-31 DIAGNOSIS — I10 Essential (primary) hypertension: Secondary | ICD-10-CM | POA: Diagnosis not present

## 2019-08-31 DIAGNOSIS — R911 Solitary pulmonary nodule: Secondary | ICD-10-CM

## 2019-08-31 DIAGNOSIS — E222 Syndrome of inappropriate secretion of antidiuretic hormone: Secondary | ICD-10-CM

## 2019-08-31 DIAGNOSIS — R739 Hyperglycemia, unspecified: Secondary | ICD-10-CM

## 2019-08-31 DIAGNOSIS — J4520 Mild intermittent asthma, uncomplicated: Secondary | ICD-10-CM

## 2019-08-31 DIAGNOSIS — E871 Hypo-osmolality and hyponatremia: Secondary | ICD-10-CM | POA: Diagnosis not present

## 2019-08-31 DIAGNOSIS — C61 Malignant neoplasm of prostate: Secondary | ICD-10-CM

## 2019-08-31 LAB — CBC WITH DIFFERENTIAL/PLATELET
Basophils Absolute: 0.1 10*3/uL (ref 0.0–0.1)
Basophils Relative: 0.6 % (ref 0.0–3.0)
Eosinophils Absolute: 0 10*3/uL (ref 0.0–0.7)
Eosinophils Relative: 0 % (ref 0.0–5.0)
HCT: 36.4 % — ABNORMAL LOW (ref 39.0–52.0)
Hemoglobin: 12.4 g/dL — ABNORMAL LOW (ref 13.0–17.0)
Lymphocytes Relative: 22 % (ref 12.0–46.0)
Lymphs Abs: 2.1 10*3/uL (ref 0.7–4.0)
MCHC: 34 g/dL (ref 30.0–36.0)
MCV: 91.9 fl (ref 78.0–100.0)
Monocytes Absolute: 0.8 10*3/uL (ref 0.1–1.0)
Monocytes Relative: 8.7 % (ref 3.0–12.0)
Neutro Abs: 6.5 10*3/uL (ref 1.4–7.7)
Neutrophils Relative %: 68.7 % (ref 43.0–77.0)
Platelets: 433 10*3/uL — ABNORMAL HIGH (ref 150.0–400.0)
RBC: 3.96 Mil/uL — ABNORMAL LOW (ref 4.22–5.81)
RDW: 13.2 % (ref 11.5–15.5)
WBC: 9.4 10*3/uL (ref 4.0–10.5)

## 2019-08-31 LAB — BASIC METABOLIC PANEL
BUN: 17 mg/dL (ref 6–23)
CO2: 23 mEq/L (ref 19–32)
Calcium: 9.3 mg/dL (ref 8.4–10.5)
Chloride: 102 mEq/L (ref 96–112)
Creatinine, Ser: 0.79 mg/dL (ref 0.40–1.50)
GFR: 98.25 mL/min (ref >60.00)
Glucose, Bld: 127 mg/dL — ABNORMAL HIGH (ref 70–99)
Potassium: 3.9 mEq/L (ref 3.5–5.1)
Sodium: 134 mEq/L — ABNORMAL LOW (ref 135–145)

## 2019-08-31 LAB — CORTISOL: Cortisol, Plasma: 3.3 ug/dL

## 2019-08-31 LAB — HEMOGLOBIN A1C: Hgb A1c MFr Bld: 5.2 % (ref 4.6–6.5)

## 2019-08-31 NOTE — Patient Instructions (Signed)

## 2019-08-31 NOTE — Progress Notes (Signed)
Synopsis: Follow up from hospitalization Feb 2021, PCP: [name], MD  Subjective:   PATIENT ID: [name] Holder GENDER: male DOB: 04-08-1956, MRN: [id number]  Chief Complaint  Patient presents with  . Hospitalization Follow-up    Patient was in hospital with pneumonia. He states that he is doing much better since his stay.     PMH Asthma, Former smoker, quit 5 years, smoked on and off since college, heaviest 1 pack per day, menthol newports. H/o of prostate cancer, and tumor removal, bladder cancer. On prednisone '5mg'$  daily, leupron and abaraterone.  Patient was initially seen by myself in consultation on 08/16/2019 in the [facility] emergency department.  He is admitted to the hospital for hallucinations and found to be hyponatremic.  He has a history of prostate cancer on chronic prednisone.  His CT scan on admission had a right middle lobe nodular opacity that was matched on chest x-ray imaging concerning for community-acquired pneumonia.  He was treated by outpatient provider with Bactrim.  He is a former smoker quit 5 years ago as stated above.  Has a history of asthma as a child.  Currently managed with duo nebs.  Ultimately the decision was made that the patient be brought to the intensive care unit placed on 3% hypertonic saline.  He had improvement of his mentation slowly.  There was also concern for possible cross-reactivity from taking extra doses of his opiate pain medication.  OV 08/31/2019: Patient here today for follow-up regarding the abnormalities on his CAT scan imaging as well as history of asthma possible COPD from his longstanding history of smoking.  Overall doing great in comparison to where he was at his last evaluation.  He does have shortness of breath with exertion.  He does present today in a wheelchair.  Wife is with him.   Past Medical History:  Diagnosis Date  . Allergic rhinitis   . At risk for sleep apnea    STOP-BANG SCORE = 5    (routed to pt's pcp  09-08-2018)  . Chronic constipation   . Chronic low back pain   . Chronic pain   . DOE (dyspnea on exertion)   . History of kidney stones   . History of recent pneumonia 08/10/2018   acute bronchopneumonia;  follow-up cxr 08-28-2018 in epic  . History of vertebral compression fracture    T10 and T12 s/p kyphoplasty  . Hx of colonic polyps   . Hyperplasia of prostate with lower urinary tract symptoms (LUTS)   . Hypertension   . Mild persistent asthma    followed by pcp  . Numbness in both legs    secondary to a fall, per pt has had work-up and couldn't find anything  . Osteopenia   . Prostate cancer metastatic to pelvis [facility]) urologist-- dr borden/  oncologist -- dr Alen Blew   dx 2014 advanced prostate cancer with pelvid adenopathy,  Stage T2c, Gleason 7;  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  . Wears glasses      Family History  Problem Relation Age of Onset  . Arthritis Mother   . Diabetes Mother   . Hypertension Mother   . Hyperlipidemia Mother   . Hypertension Father   . Prostate cancer Father        radiation in mid 36s  . Heart attack Father   . Cancer Sister        precancerous colon polyps, 6 in colon removed  . Stroke Maternal Grandfather  Past Surgical History:  Procedure Laterality Date  . COLONOSCOPY    . IR KYPHO EA ADDL LEVEL THORACIC OR LUMBAR  08/17/2018  . IR KYPHO EA ADDL LEVEL THORACIC OR LUMBAR  08/26/2018  . IR KYPHO LUMBAR INC FX REDUCE BONE BX UNI/BIL CANNULATION INC/IMAGING  08/26/2018  . IR KYPHO THORACIC WITH BONE BIOPSY  08/17/2018  . IR RADIOLOGIST EVAL & MGMT  08/11/2018  . IR RADIOLOGIST EVAL & MGMT  08/24/2018  . LYMPHADENECTOMY Left 01/05/2013   Procedure: ROBOTIC LYMPHADENECTOMY;  Surgeon: Dutch Gray, MD;  Location: [facility];  Service: Urology;  Laterality: Left;  . PROSTATE BIOPSY     11/12 positive biopsies  . ROTATOR CUFF REPAIR Left 2012  . TRANSURETHRAL RESECTION OF BLADDER TUMOR N/A 09/15/2018   Procedure: TRANSURETHRAL RESECTION OF BLADDER  TUMOR (TURBT)WITH CYSTOSCOPY/ POST OPERATIVE INSTILLATION OF GEMCITABINE;  Surgeon: Raynelle Bring, MD;  Location: [facility];  Service: Urology;  Laterality: N/A;  GENERAL ANESTHESIA WITH PARALYSIS  . UMBILICAL HERNIA REPAIR  09-02-2001   dr t. price '@[facility]'$     Social History   Socioeconomic History  . Marital status: Married    Spouse name: Not on file  . Number of children: Not on file  . Years of education: 27  . Highest education level: Not on file  Occupational History  . 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 Smoking status: Former Smoker    Packs/day: 0.50    Years: 25.00    Pack years: 12.50    Types: Cigarettes    Quit date: 09/08/2012    Years since quitting: 6.9  . Smokeless tobacco: Never Used  Substance and Sexual Activity  . Alcohol use: Not Currently    Alcohol/week: 2.0 standard drinks    Types: 2 Glasses of wine per week  . Drug use: Never  . Sexual activity: Not on file  Other Topics Concern  . Not on file  Social History Narrative   Regular exercise-yes   Caffeine Use-yes   Social Determinants of Health   Financial Resource Strain:   . Difficulty of Paying Living Expenses: Not on file  Food Insecurity:   . Worried About Charity fundraiser in the Last Year: Not on file  . Ran Out of Food in the Last Year: Not on file  Transportation Needs:   . Lack of Transportation (Medical): Not on file  . Lack of Transportation (Non-Medical): Not on file  Physical Activity:   . Days of Exercise per Week: Not on file  . Minutes of Exercise per Session: Not on file  Stress:   . Feeling of Stress : Not on file  Social Connections:   . Frequency of Communication with Friends and Family: Not on file  . Frequency of Social Gatherings with Friends and Family: Not on file  . Attends Religious Services: Not on file  . Active Member of Clubs or Organizations: Not on file  . Attends Archivist Meetings: Not  on file  . Marital Status: Not on file  Intimate Partner Violence:   . Fear of Current or Ex-Partner: Not on file  . Emotionally Abused: Not on file  . Physically Abused: Not on file  . Sexually Abused: Not on file     Allergies  Allergen Reactions  . Amlodipine Swelling    edema  . Tetracycline Rash     Outpatient Medications Prior to Visit  Medication Sig Dispense  Refill  . abiraterone acetate (ZYTIGA) 250 MG tablet Take 1,000 mg by mouth daily.   10  . acetaminophen (TYLENOL) 325 MG tablet Take 325 mg by mouth daily as needed for fever.    [name] albuterol (PROAIR HFA) 108 (90 Base) MCG/ACT inhaler 2 PUFFS EVERY 6 HOURS AS NEEDED FOR WHEEZING (Patient taking differently: Inhale 2 puffs into the lungs every 6 (six) hours as needed for wheezing. 2 PUFFS EVERY 6 HOURS AS NEEDED FOR WHEEZING) 8.5 g 5  . albuterol (PROVENTIL) (2.5 MG/3ML) 0.083% nebulizer solution NEBULIZE 1 VIAL EVERY 6-8 HOURS AS NEEDED FOR WHEEZING OR SHORTNESS OF BREATH (Patient taking differently: Take 2.5 mg by nebulization every 6 (six) hours as needed for wheezing or shortness of breath. NEBULIZE 1 VIAL EVERY 6-8 HOURS AS NEEDED FOR WHEEZING OR SHORTNESS OF BREATH) 180 mL 3  . Azelastine HCl 137 MCG/SPRAY SOLN USE 1 TO 2 SPRAYS IN EACH NOSTRIL EVERY 12 HOURS 30 mL 1  . b complex vitamins tablet Take 1 tablet by mouth daily.    [name] CALCIUM-VITAMIN D PO Take 1 tablet by mouth 2 (two) times daily.     . carvedilol (COREG) 3.125 MG tablet Take 2 tablets (6.25 mg total) by mouth 2 (two) times daily with a meal.  1  . furosemide (LASIX) 20 MG tablet Take 1 tablet (20 mg total) by mouth daily. 30 tablet 0  . ipratropium-albuterol (DUONEB) 0.5-2.5 (3) MG/3ML SOLN Take 3 mLs by nebulization every 4 (four) hours as needed. (Patient taking differently: Take 3 mLs by nebulization every 4 (four) hours as needed (wheezing/SOB). ) 360 mL 1  . Leuprolide Acetate (LUPRON IJ) Inject 1 Dose as directed every 4 (four) months. This is  administered by [facility]    . mometasone (NASONEX) 50 MCG/ACT nasal spray USE 2 SPRAYS IN EACH NOSTRIL ONCE DAILY. (Patient taking differently: Place 2 sprays into the nose daily. ) 51 g 0  . Multiple Vitamins-Minerals (CENTRUM SILVER 50+MEN PO) Take 1 tablet by mouth daily.    [name] NARCAN 4 MG/0.1ML LIQD nasal spray kit Place 0.4 mg into the nose once as needed (overdose).     [name] olmesartan (BENICAR) 20 MG tablet Take 1 tablet (20 mg total) by mouth 2 (two) times daily. 60 tablet 1  . oxyCODONE (OXY IR/ROXICODONE) 5 MG immediate release tablet Take 1 tablet (5 mg total) by mouth every 4 (four) hours as needed for severe pain. (Patient taking differently: Take 2.5 mg by mouth every 4 (four) hours as needed for severe pain. ) 180 tablet 0  . predniSONE (DELTASONE) 5 MG tablet Take 1 tablet (5 mg total) by mouth daily with breakfast. (Patient taking differently: Take 5 mg by mouth 2 (two) times daily with a meal. )    . sodium chloride 1 g tablet Take 1 tablet (1 g total) by mouth 2 (two) times daily with a meal for 14 days. 28 tablet 0  . solifenacin (VESICARE) 5 MG tablet Take 5 mg by mouth daily.    . SYMBICORT 160-4.5 MCG/ACT inhaler Inhale 2 puffs into the lungs 2 (two) times daily. (Patient taking differently: Inhale 2 puffs into the lungs 2 (two) times daily. ) 30.6 g 3  . SYRINGE-NEEDLE, DISP, 3 ML 3 ML MISC Use to inject b12 monthly. 12 each 0   No facility-administered medications prior to visit.    Review of Systems  Constitutional: Negative for chills, fever, malaise/fatigue and weight loss.  HENT: Negative for hearing loss, sore  throat and tinnitus.   Eyes: Negative for blurred vision and double vision.  Respiratory: Positive for shortness of breath. Negative for cough, hemoptysis, sputum production, wheezing and stridor.   Cardiovascular: Negative for chest pain, palpitations, orthopnea, leg swelling and PND.  Gastrointestinal: Negative for abdominal pain, constipation, diarrhea,  heartburn, nausea and vomiting.  Genitourinary: Negative for dysuria, hematuria and urgency.  Musculoskeletal: Negative for joint pain and myalgias.  Skin: Negative for itching and rash.  Neurological: Negative for dizziness, tingling, weakness and headaches.  Endo/Heme/Allergies: Negative for environmental allergies. Does not bruise/bleed easily.  Psychiatric/Behavioral: Negative for depression. The patient is not nervous/anxious and does not have insomnia.   All other systems reviewed and are negative.    Objective:  Physical Exam Vitals reviewed.  Constitutional:      General: He is not in acute distress.    Appearance: He is well-developed. He is obese.  HENT:     Head: Normocephalic and atraumatic.  Eyes:     General: No scleral icterus.    Conjunctiva/sclera: Conjunctivae normal.     Pupils: Pupils are equal, round, and reactive to light.  Neck:     Vascular: No JVD.     Trachea: No tracheal deviation.  Cardiovascular:     Rate and Rhythm: Normal rate and regular rhythm.     Heart sounds: Normal heart sounds. No murmur.  Pulmonary:     Effort: Pulmonary effort is normal. No tachypnea, accessory muscle usage or respiratory distress.     Breath sounds: Normal breath sounds. No stridor. No wheezing, rhonchi or rales.  Musculoskeletal:        General: No tenderness.     Cervical back: Neck supple.  Lymphadenopathy:     Cervical: No cervical adenopathy.  Skin:    General: Skin is warm and dry.     Capillary Refill: Capillary refill takes less than 2 seconds.     Findings: Bruising present. No rash.  Neurological:     Mental Status: He is alert and oriented to person, place, and time.  Psychiatric:        Behavior: Behavior normal.      Vitals:   08/31/19 1443  BP: 120/80  Pulse: 83  Temp: (!) 97 F (36.1 C)  TempSrc: Temporal  SpO2: 98%  Weight: 203 lb 3.2 oz (92.2 kg)  Height: '5\' 8"'$  (1.727 m)   98% on RA BMI Readings from Last 3 Encounters:  08/31/19  30.90 kg/m  08/31/19 31.79 kg/m  08/16/19 33.18 kg/m   Wt Readings from Last 3 Encounters:  08/31/19 203 lb 3.2 oz (92.2 kg)  08/31/19 203 lb (92.1 kg)  08/16/19 211 lb 13.8 oz (96.1 kg)     CBC    Component Value Date/Time   WBC 7.2 08/16/2019 0436   RBC 3.78 (L) 08/16/2019 0436   HGB 12.1 (L) 08/16/2019 0436   HGB 13.4 04/14/2019 0855   HCT 33.4 (L) 08/16/2019 0436   PLT 142 (L) 08/16/2019 0436   PLT 244 04/14/2019 0855   MCV 88.4 08/16/2019 0436   MCH 32.0 08/16/2019 0436   MCHC 36.2 (H) 08/16/2019 0436   RDW 12.4 08/16/2019 0436   LYMPHSABS 1.4 08/15/2019 1212   MONOABS 1.2 (H) 08/15/2019 1212   EOSABS 0.1 08/15/2019 1212   BASOSABS 0.0 08/15/2019 1212     Chest Imaging: 08/09/2019 CT chest: Patient with right middle lobe nodular opacity underlying malignancy not excluded.  Likely inflammatory related.  Possible community-acquired pneumonia. The patient's images have been  independently reviewed by me.    Pulmonary Functions Testing Results: PFT Results Latest Ref Rng & Units 07/25/2013  FVC-Pre L 4.60  FVC-Predicted Pre % 101  FVC-Post L 4.61  FVC-Predicted Post % 101  Pre FEV1/FVC % % 70  Post FEV1/FCV % % 72  FEV1-Pre L 3.21  FEV1-Predicted Pre % 93  FEV1-Post L 3.31  DLCO UNC% % 109  DLCO COR %Predicted % 94  TLC L 6.29  TLC % Predicted % 94  RV % Predicted % 73       Assessment & Plan:     ICD-10-CM   1. History of community acquired pneumonia  Z87.01 Pulmonary Function Test  2. Former smoker  Z87.891   3. Mild intermittent asthma, unspecified whether complicated  Z61.09   4. Prostate cancer ([place])  C61   5. SIADH (syndrome of inappropriate ADH production) ([place])  E22.2 Ambulatory referral to Endocrinology  6. Hyponatremia  E87.1 Ambulatory referral to Endocrinology  7. Solitary pulmonary nodule  R91.1 CT Chest Wo Contrast    Discussion: This is a 64 year old gentleman with a right middle lobe nodular opacity.  Recent admission for  community-acquired pneumonia.  A former smoker quit 5 years ago.  He also has a history of mild asthma intermittent symptoms as a kid.  Currently managed from a respiratory standpoint with Symbicort plus as needed duo nebs and as needed albuterol.  Still uses all of these regularly.  Additionally he takes 5 mg of prednisone daily for regiment of his prostate cancer.  He has had more than one episode of hyponatremia.  I think the hyponatremia is likely a mixed picture related to SIADH versus adrenal corticoid excess suppression from chronic steroid use.  Also has been on opiates for some time.  Plan Following Extensive Data Review & Interpretation:  . I reviewed prior external note(s) from recent hospitalization, 08/19/2019 discharge summary, Dr. Marthenia Rolling.  Primary care office note, Dr. [name] 08/08/2019. . I reviewed the result(s) of BMP, ACTH stim test, 08/17/2019 . I have ordered referral to endocrinology, chronic steroids history of hyponatremia, question of SIADH, chronic steroid use with adrenal axis suppression, recent ICU admission for hyponatremia and significant neurologic effects.  I have ordered repeat noncontrasted CT of the chest in 3 months for follow-up of nodular opacity. I have ordered full pulmonary function test to be completed, former smoker, history of asthma since childhood  Independent interpretation of tests . Review of patient's 08/09/2019 CT chest images images revealed a middle lobe nodular opacity as described above. The patient's images have been independently reviewed by me.    Return to clinic in 3 months following chest imaging.     Current Outpatient Medications:  .  abiraterone acetate (ZYTIGA) 250 MG tablet, Take 1,000 mg by mouth daily. , Disp: , Rfl: 10 .  acetaminophen (TYLENOL) 325 MG tablet, Take 325 mg by mouth daily as needed for fever., Disp: , Rfl:  .  albuterol (PROAIR HFA) 108 (90 Base) MCG/ACT inhaler, 2 PUFFS EVERY 6 HOURS AS NEEDED FOR WHEEZING (Patient  taking differently: Inhale 2 puffs into the lungs every 6 (six) hours as needed for wheezing. 2 PUFFS EVERY 6 HOURS AS NEEDED FOR WHEEZING), Disp: 8.5 g, Rfl: 5 .  albuterol (PROVENTIL) (2.5 MG/3ML) 0.083% nebulizer solution, NEBULIZE 1 VIAL EVERY 6-8 HOURS AS NEEDED FOR WHEEZING OR SHORTNESS OF BREATH (Patient taking differently: Take 2.5 mg by nebulization every 6 (six) hours as needed for wheezing or shortness of breath. NEBULIZE 1 VIAL  EVERY 6-8 HOURS AS NEEDED FOR WHEEZING OR SHORTNESS OF BREATH), Disp: 180 mL, Rfl: 3 .  Azelastine HCl 137 MCG/SPRAY SOLN, USE 1 TO 2 SPRAYS IN EACH NOSTRIL EVERY 12 HOURS, Disp: 30 mL, Rfl: 1 .  b complex vitamins tablet, Take 1 tablet by mouth daily., Disp: , Rfl:  .  CALCIUM-VITAMIN D PO, Take 1 tablet by mouth 2 (two) times daily. , Disp: , Rfl:  .  carvedilol (COREG) 3.125 MG tablet, Take 2 tablets (6.25 mg total) by mouth 2 (two) times daily with a meal., Disp: , Rfl: 1 .  furosemide (LASIX) 20 MG tablet, Take 1 tablet (20 mg total) by mouth daily., Disp: 30 tablet, Rfl: 0 .  ipratropium-albuterol (DUONEB) 0.5-2.5 (3) MG/3ML SOLN, Take 3 mLs by nebulization every 4 (four) hours as needed. (Patient taking differently: Take 3 mLs by nebulization every 4 (four) hours as needed (wheezing/SOB). ), Disp: 360 mL, Rfl: 1 .  Leuprolide Acetate (LUPRON IJ), Inject 1 Dose as directed every 4 (four) months. This is administered by [facility], Disp: , Rfl:  .  mometasone (NASONEX) 50 MCG/ACT nasal spray, USE 2 SPRAYS IN EACH NOSTRIL ONCE DAILY. (Patient taking differently: Place 2 sprays into the nose daily. ), Disp: 51 g, Rfl: 0 .  Multiple Vitamins-Minerals (CENTRUM SILVER 50+MEN PO), Take 1 tablet by mouth daily., Disp: , Rfl:  .  NARCAN 4 MG/0.1ML LIQD nasal spray kit, Place 0.4 mg into the nose once as needed (overdose). , Disp: , Rfl:  .  olmesartan (BENICAR) 20 MG tablet, Take 1 tablet (20 mg total) by mouth 2 (two) times daily., Disp: 60 tablet, Rfl: 1 .   oxyCODONE (OXY IR/ROXICODONE) 5 MG immediate release tablet, Take 1 tablet (5 mg total) by mouth every 4 (four) hours as needed for severe pain. (Patient taking differently: Take 2.5 mg by mouth every 4 (four) hours as needed for severe pain. ), Disp: 180 tablet, Rfl: 0 .  predniSONE (DELTASONE) 5 MG tablet, Take 1 tablet (5 mg total) by mouth daily with breakfast. (Patient taking differently: Take 5 mg by mouth 2 (two) times daily with a meal. ), Disp: , Rfl:  .  sodium chloride 1 g tablet, Take 1 tablet (1 g total) by mouth 2 (two) times daily with a meal for 14 days., Disp: 28 tablet, Rfl: 0 .  solifenacin (VESICARE) 5 MG tablet, Take 5 mg by mouth daily., Disp: , Rfl:  .  SYMBICORT 160-4.5 MCG/ACT inhaler, Inhale 2 puffs into the lungs 2 (two) times daily. (Patient taking differently: Inhale 2 puffs into the lungs 2 (two) times daily. ), Disp: 30.6 g, Rfl: 3 .  SYRINGE-NEEDLE, DISP, 3 ML 3 ML MISC, Use to inject b12 monthly., Disp: 12 each, Rfl: 0   Garner Nash, DO [facility] 08/31/2019 2:57 PM

## 2019-08-31 NOTE — Progress Notes (Signed)
Subjective:  Patient ID: [name] Holder, male    DOB: [birth year]  Age: 64 y.o. MRN: [id number]  CC: Hypertension   This visit occurred during the SARS-CoV-2 public health emergency.  Safety protocols were in place, including screening questions prior to the visit, additional usage of staff PPE, and extensive cleaning of exam room while observing appropriate contact time as indicated for disinfecting solutions.    HPI [name] Holder presents for f/up - When I last saw him he complained of cough and a CT scan of the chest showed a reticular nodular pneumonia pattern so I treated him for pneumocystis pneumonia with Bactrim.  He was admitted a few days later for symptomatic hyponatremia with hallucinations.  The Bactrim was changed to Rocephin and Zithromax.  He tells me the cough has resolved.  The hyponatremia was treated with salt.  It was felt that the hyponatremia was multifactorial including poor p.o. intake, Bactrim DS, SIADH, and perhaps adrenal insufficiency. The thiazide diuretic was changed to a loop diuretic.  He tells me he is feeling much better.  He has had some weight loss but denies nausea, vomiting, fever, chills, diarrhea, or constipation.  Outpatient Medications Prior to Visit  Medication Sig Dispense Refill  . abiraterone acetate (ZYTIGA) 250 MG tablet Take 1,000 mg by mouth daily.   10  . acetaminophen (TYLENOL) 325 MG tablet Take 325 mg by mouth daily as needed for fever.    [name] albuterol (PROAIR HFA) 108 (90 Base) MCG/ACT inhaler 2 PUFFS EVERY 6 HOURS AS NEEDED FOR WHEEZING (Patient taking differently: Inhale 2 puffs into the lungs every 6 (six) hours as needed for wheezing. 2 PUFFS EVERY 6 HOURS AS NEEDED FOR WHEEZING) 8.5 g 5  . albuterol (PROVENTIL) (2.5 MG/3ML) 0.083% nebulizer solution NEBULIZE 1 VIAL EVERY 6-8 HOURS AS NEEDED FOR WHEEZING OR SHORTNESS OF BREATH (Patient taking differently: Take 2.5 mg by nebulization every 6 (six) hours as needed for wheezing or  shortness of breath. NEBULIZE 1 VIAL EVERY 6-8 HOURS AS NEEDED FOR WHEEZING OR SHORTNESS OF BREATH) 180 mL 3  . Azelastine HCl 137 MCG/SPRAY SOLN USE 1 TO 2 SPRAYS IN EACH NOSTRIL EVERY 12 HOURS 30 mL 1  . b complex vitamins tablet Take 1 tablet by mouth daily.    [name] CALCIUM-VITAMIN D PO Take 1 tablet by mouth 2 (two) times daily.     . carvedilol (COREG) 3.125 MG tablet Take 2 tablets (6.25 mg total) by mouth 2 (two) times daily with a meal.  1  . furosemide (LASIX) 20 MG tablet Take 1 tablet (20 mg total) by mouth daily. 30 tablet 0  . ipratropium-albuterol (DUONEB) 0.5-2.5 (3) MG/3ML SOLN Take 3 mLs by nebulization every 4 (four) hours as needed. (Patient taking differently: Take 3 mLs by nebulization every 4 (four) hours as needed (wheezing/SOB). ) 360 mL 1  . Leuprolide Acetate (LUPRON IJ) Inject 1 Dose as directed every 4 (four) months. This is administered by [facility]    . mometasone (NASONEX) 50 MCG/ACT nasal spray USE 2 SPRAYS IN EACH NOSTRIL ONCE DAILY. (Patient taking differently: Place 2 sprays into the nose daily. ) 51 g 0  . Multiple Vitamins-Minerals (CENTRUM SILVER 50+MEN PO) Take 1 tablet by mouth daily.    [name] NARCAN 4 MG/0.1ML LIQD nasal spray kit Place 0.4 mg into the nose once as needed (overdose).     [name] olmesartan (BENICAR) 20 MG tablet Take 1 tablet (20 mg total) by mouth 2 (two) times daily.  60 tablet 1  . oxyCODONE (OXY IR/ROXICODONE) 5 MG immediate release tablet Take 1 tablet (5 mg total) by mouth every 4 (four) hours as needed for severe pain. (Patient taking differently: Take 2.5 mg by mouth every 4 (four) hours as needed for severe pain. ) 180 tablet 0  . predniSONE (DELTASONE) 5 MG tablet Take 1 tablet (5 mg total) by mouth daily with breakfast. (Patient taking differently: Take 5 mg by mouth 2 (two) times daily with a meal. )    . sodium chloride 1 g tablet Take 1 tablet (1 g total) by mouth 2 (two) times daily with a meal for 14 days. 28 tablet 0  . solifenacin  (VESICARE) 5 MG tablet Take 5 mg by mouth daily.    . SYMBICORT 160-4.5 MCG/ACT inhaler Inhale 2 puffs into the lungs 2 (two) times daily. (Patient taking differently: Inhale 2 puffs into the lungs 2 (two) times daily. ) 30.6 g 3  . SYRINGE-NEEDLE, DISP, 3 ML 3 ML MISC Use to inject b12 monthly. 12 each 0   No facility-administered medications prior to visit.    ROS Review of Systems  Constitutional: Positive for unexpected weight change (wt loss). Negative for chills, diaphoresis, fatigue and fever.  HENT: Negative.   Eyes: Negative.   Respiratory: Negative for cough, chest tightness, shortness of breath and wheezing.   Cardiovascular: Negative for chest pain, palpitations and leg swelling.  Gastrointestinal: Negative for abdominal pain, constipation, diarrhea, nausea and vomiting.  Endocrine: Negative.  Negative for polydipsia, polyphagia and polyuria.  Genitourinary: Negative for difficulty urinating and frequency.  Musculoskeletal: Negative.  Negative for myalgias.  Skin: Negative.   Neurological: Negative.  Negative for dizziness, weakness and light-headedness.  Hematological: Negative for adenopathy. Does not bruise/bleed easily.  Psychiatric/Behavioral: Negative.  Negative for dysphoric mood and hallucinations. The patient is not nervous/anxious.     Objective:  BP 118/72 (BP Location: Left Arm, Patient Position: Sitting, Cuff Size: Large)   Pulse 84   Temp 98 F (36.7 C) (Oral)   Resp 16   Ht '5\' 7"'$  (1.702 m)   Wt 203 lb (92.1 kg)   SpO2 97%   BMI 31.79 kg/m   BP Readings from Last 3 Encounters:  08/31/19 120/80  08/31/19 118/72  08/19/19 138/74    Wt Readings from Last 3 Encounters:  08/31/19 203 lb 3.2 oz (92.2 kg)  08/31/19 203 lb (92.1 kg)  08/16/19 211 lb 13.8 oz (96.1 kg)    Physical Exam Vitals reviewed.  Constitutional:      General: He is not in acute distress.    Appearance: He is ill-appearing (in a wheelchair today). He is not toxic-appearing or  diaphoretic.  HENT:     Nose: Nose normal.  Eyes:     General: No scleral icterus.    Conjunctiva/sclera: Conjunctivae normal.  Cardiovascular:     Rate and Rhythm: Normal rate and regular rhythm.     Heart sounds: No murmur.  Pulmonary:     Effort: Pulmonary effort is normal.     Breath sounds: No stridor. No wheezing, rhonchi or rales.  Abdominal:     General: Abdomen is flat. Bowel sounds are normal. There is no distension.     Palpations: Abdomen is soft. There is no hepatomegaly, splenomegaly or mass.     Tenderness: There is no abdominal tenderness.  Musculoskeletal:        General: Normal range of motion.     Cervical back: Neck supple.  Right lower leg: No edema.     Left lower leg: No edema.  Lymphadenopathy:     Cervical: No cervical adenopathy.  Skin:    General: Skin is warm and dry.     Coloration: Skin is not pale.  Neurological:     General: No focal deficit present.     Mental Status: He is alert.  Psychiatric:        Mood and Affect: Mood normal.        Behavior: Behavior normal.     Lab Results  Component Value Date   WBC 9.4 08/31/2019   HGB 12.4 (L) 08/31/2019   HCT 36.4 (L) 08/31/2019   PLT 433.0 (H) 08/31/2019   GLUCOSE 127 (H) 08/31/2019   CHOL 174 04/18/2019   TRIG 115.0 04/18/2019   HDL 69.90 04/18/2019   LDLCALC 81 04/18/2019   ALT 17 04/14/2019   AST 15 04/14/2019   NA 134 (L) 08/31/2019   K 3.9 08/31/2019   CL 102 08/31/2019   CREATININE 0.79 08/31/2019   BUN 17 08/31/2019   CO2 23 08/31/2019   TSH 1.616 08/16/2019   PSA 0.3 07/20/2019   INR 0.94 08/26/2018   HGBA1C 5.2 08/31/2019    DG Chest 2 View  Result Date: 08/15/2019 CLINICAL DATA:  Shortness of breath. EXAM: CHEST - 2 VIEW COMPARISON:  CT 08/09/2019.  Chest x-ray 08/08/2019. FINDINGS: Heart size normal. Low lung volumes. Left upper lung and bibasilar pulmonary subsegmental atelectasis and pulmonary infiltrates again noted. Similar findings noted on prior exam. No  pleural effusion or pneumothorax. Stable scratched it prior vertebroplasties. Stable thoracic compression fractures. Diffuse osteopenia degenerative change. IMPRESSION: Times. Left upper lung and bibasilar subsegmental atelectasis and pulmonary infiltrates again noted. Similar findings noted on prior exam. Electronically Signed   By: [place]   On: 08/15/2019 12:36   CT Head Wo Contrast  Result Date: 08/15/2019 CLINICAL DATA:  Encephalopathy, hallucinations EXAM: CT HEAD WITHOUT CONTRAST TECHNIQUE: Contiguous axial images were obtained from the base of the skull through the vertex without intravenous contrast. COMPARISON:  04/11/2018 FINDINGS: Brain: No evidence of acute infarction, hemorrhage, extra-axial collection, ventriculomegaly, or mass effect. Generalized cerebral atrophy. Periventricular white matter low attenuation likely secondary to microangiopathy. Vascular: Cerebrovascular atherosclerotic calcifications are noted. Skull: Negative for fracture or focal lesion. Sinuses/Orbits: Visualized portions of the orbits are unremarkable. Visualized portions of the paranasal sinuses and mastoid air cells are unremarkable. Other: None. IMPRESSION: 1. No acute intracranial pathology. 2. Chronic microvascular disease and cerebral atrophy. Electronically Signed   By: Kathreen Devoid   On: 08/15/2019 13:25    Assessment & Plan:   Sahir was seen today for hypertension.  Diagnoses and all orders for this visit:  Essential hypertension- His blood pressure is well controlled.  His sodium level is up to 134.  I recommended that he continue taking the salt supplement and the current antihypertensive regimen. -     Basic metabolic panel -     Cortisol; Future -     Cortisol  Hyperglycemia- His A1c is normal today at 5.2. -     Basic metabolic panel -     Hemoglobin A1c  Anemia, unspecified type- This is consistent with the anemia of chronic disease. -     CBC with  Differential/Platelet  Hyponatremia- A random afternoon cortisol level is down to 3.3.  He will be seeing endocrinology about this. -     Basic metabolic panel -     Cortisol; Future -  Cortisol   I am having Masayoshi Couzens. Griffey maintain his CALCIUM-VITAMIN D PO, abiraterone acetate, Leuprolide Acetate (LUPRON IJ), SYRINGE-NEEDLE (DISP) 3 ML, Narcan, predniSONE, acetaminophen, Multiple Vitamins-Minerals (CENTRUM SILVER 50+MEN PO), b complex vitamins, mometasone, ipratropium-albuterol, albuterol, oxyCODONE, albuterol, olmesartan, carvedilol, Symbicort, solifenacin, furosemide, sodium chloride, and Azelastine HCl.  No orders of the defined types were placed in this encounter.    Follow-up: Return in about 3 months (around 11/28/2019).  Scarlette Calico, MD

## 2019-08-31 NOTE — Patient Instructions (Addendum)
Thank you for visiting Dr. Valeta Harms at [facility]. Today we recommend the following:  Orders Placed This Encounter  Procedures  . CT Chest Wo Contrast  . Ambulatory referral to Endocrinology  . Pulmonary Function Test   Return in about 3 months (around 11/28/2019).    Please do your part to reduce the spread of COVID-19.

## 2019-09-01 ENCOUNTER — Encounter: Payer: Self-pay | Admitting: Internal Medicine

## 2019-09-08 IMAGING — MR MR CERVICAL SPINE W/O CM
4 of 5 series · 16 of 48 positions shown · non-contrast
Comparison: Brain MRI, thoracic and lumbar spine MRI 04/11/2018.

CLINICAL DATA: 62-year-old male with metastatic prostate cancer.
Sudden onset lower extremity weakness 2 days ago.

EXAM:
MRI CERVICAL SPINE WITHOUT CONTRAST
TECHNIQUE: Multiplanar, multisequence MR imaging of the cervical spine was
performed. No intravenous contrast was administered.

[Series 3: T2 · sagittal · 3.0mm · 0.34mm/px · 6 of 15 slices shown (1 of 2)]
[im 1/15]
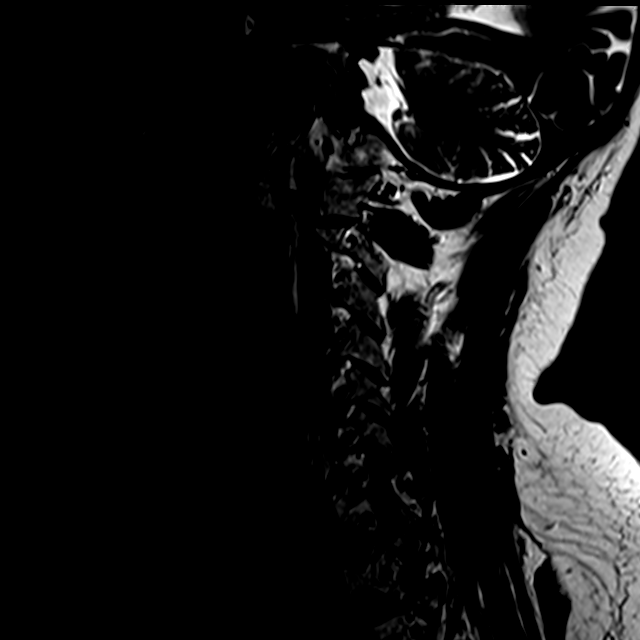
[im 3/15]
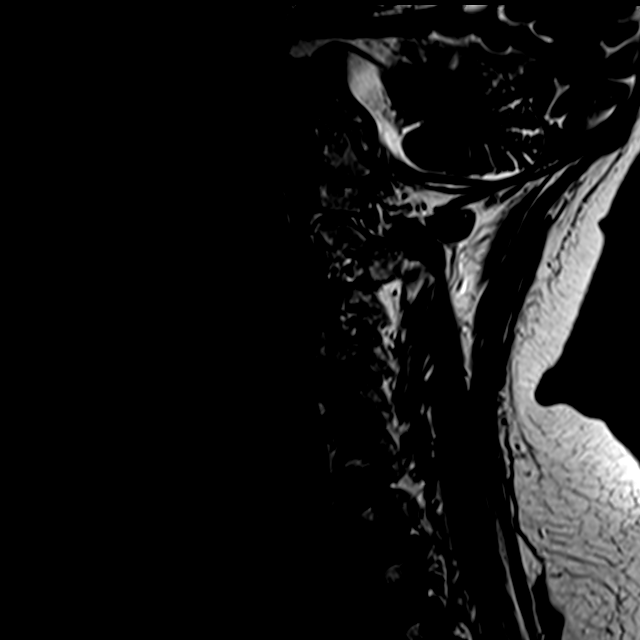
[im 6/15]
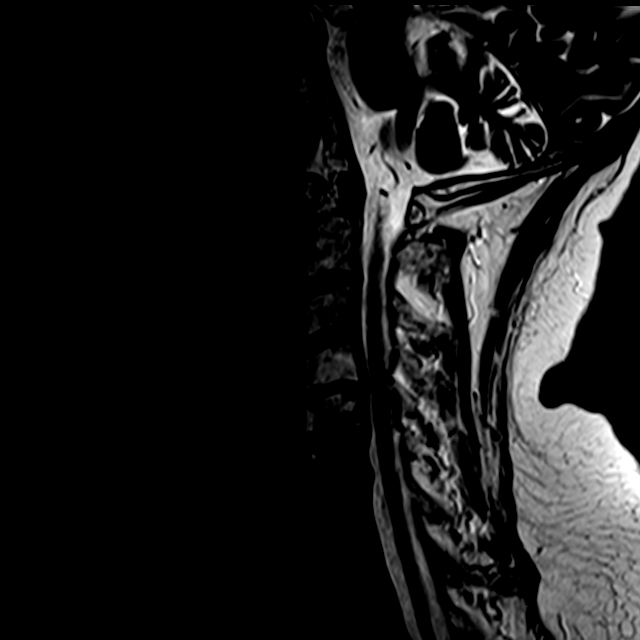
[im 9/15]
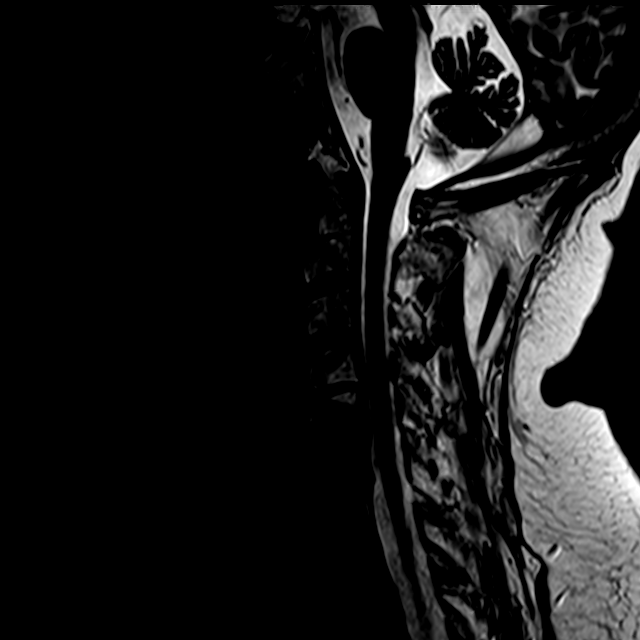
[im 12/15]
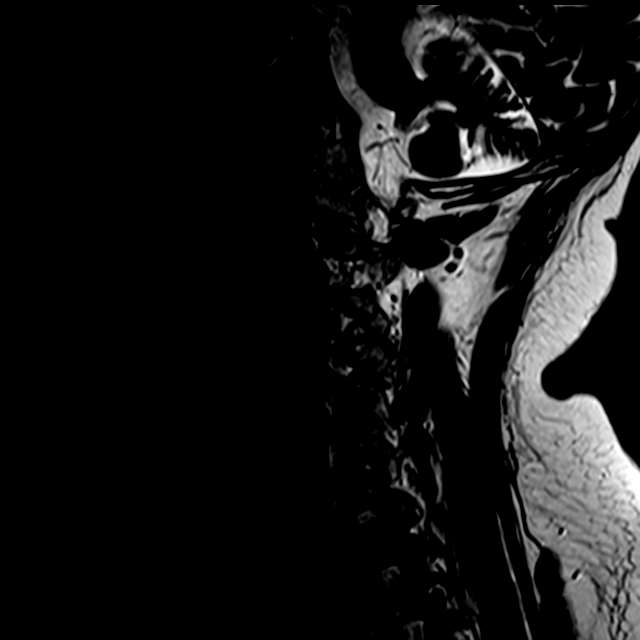
[im 15/15]
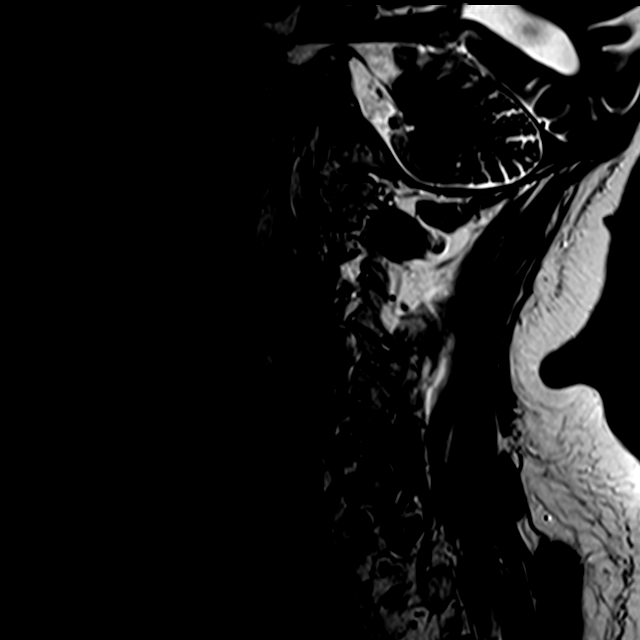

[Series 4: (id) sagital · sagittal · 3.0mm · 0.66mm/px · 3 of 13 slices shown]
[im 3/13]
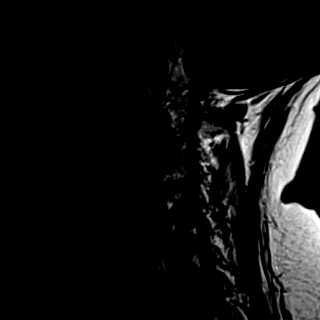
[im 8/13]
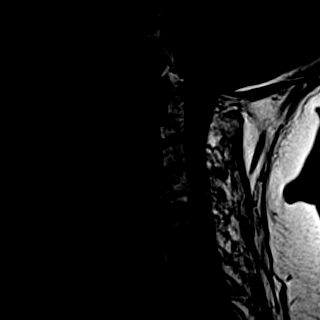
[im 13/13]
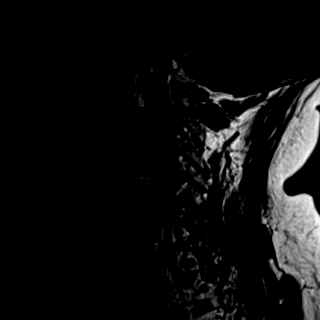

[Series 5: ir sagital · sagittal · 3.0mm · 0.33mm/px · 3 of 13 slices shown]
[im 3/13]
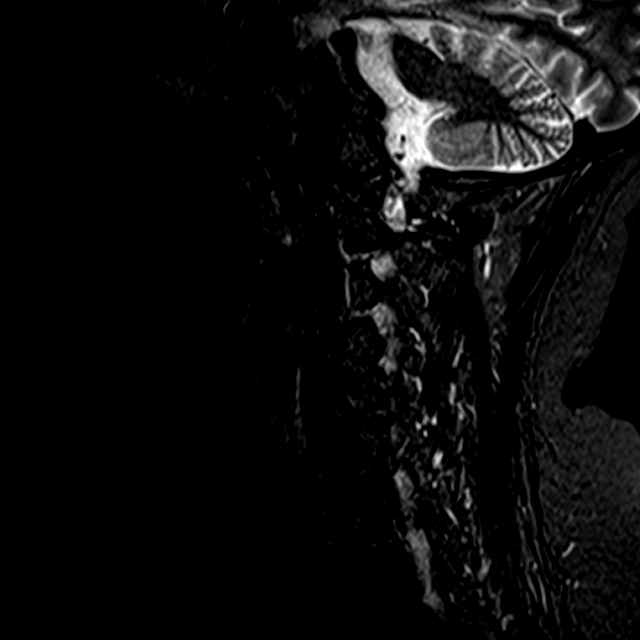
[im 8/13]
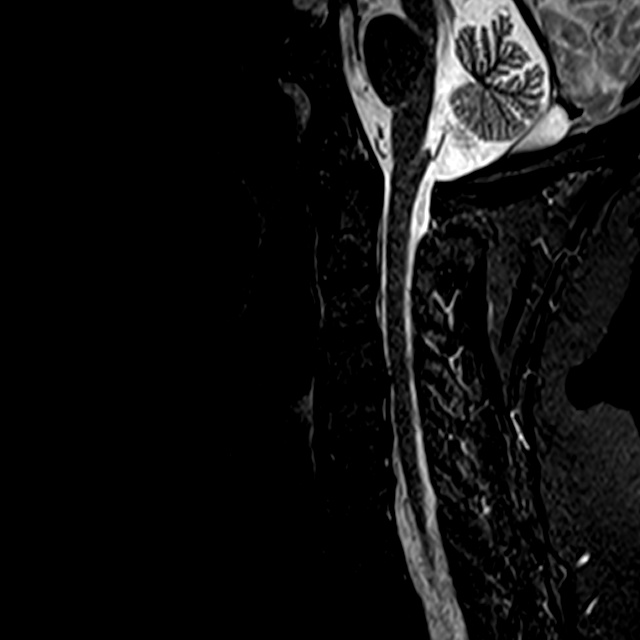
[im 13/13]
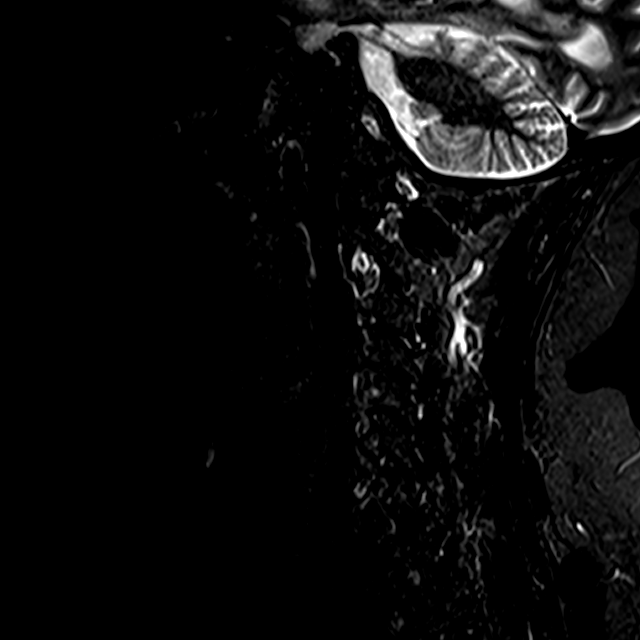

[Series 7: T2 · axial · 3.0mm · 0.25mm/px · z∈[-22,+60]mm · 4 of 32 slices shown (2 of 2)]
[im 1/32]
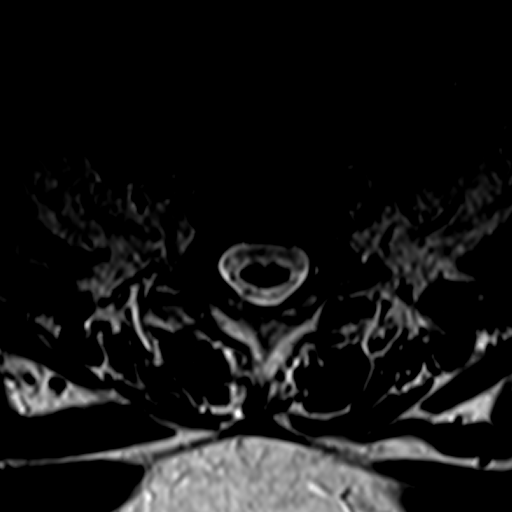
[im 5/32]
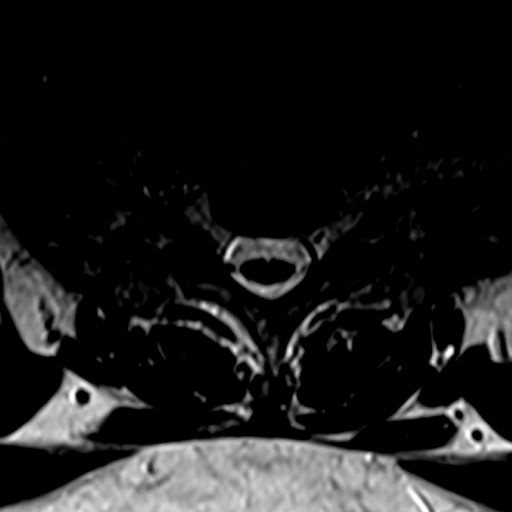
[im 16/32]
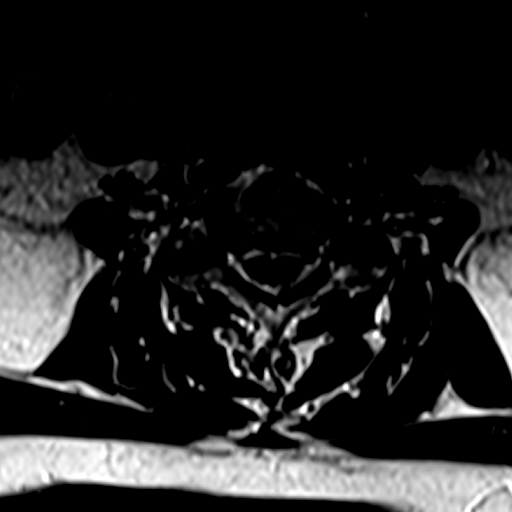
[im 27/32]
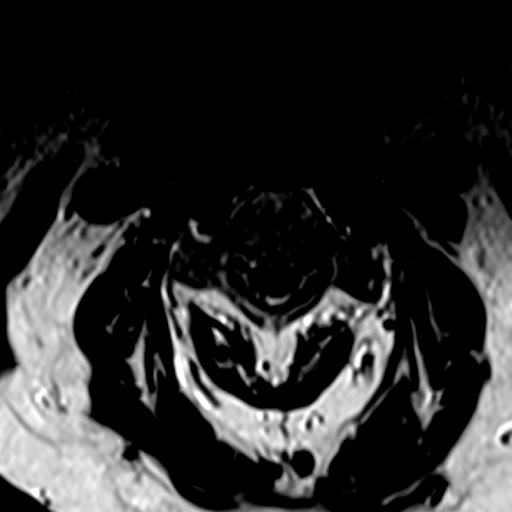

[16 of 48 positions shown; findings below may reference images not displayed]

FINDINGS: Alignment: Straightening of cervical lordosis. No significant
spondylolisthesis.

Vertebrae: Visualized bone marrow signal is within normal limits. No
osseous metastatic disease or marrow edema identified.

Cord: Spinal cord signal is within normal limits at all visualized
levels.

Posterior Fossa, vertebral arteries, paraspinal tissues: Negative
visible brain parenchyma. Preserved major vascular flow voids in the
neck, the vertebral arteries are diminutive. Negative neck soft
tissues.

Disc levels:

C2-C3: Small central disc protrusion and mild posterior disc
bulging. Mild ligament flavum hypertrophy. Borderline to mild spinal
stenosis. No spinal cord mass effect or foraminal stenosis.

C3-C4:  Mild facet hypertrophy.  Mild left C4 foraminal stenosis.

C4-C5: Disc space loss with circumferential disc bulge and endplate
spurring eccentric to the right. Mild ligament flavum hypertrophy.
Mild spinal stenosis and spinal cord mass effect. Mild left and
moderate to severe right C5 foraminal stenosis.

C5-C6: Mild circumferential disc bulge and endplate spurring
eccentric to the right. No spinal stenosis. Mild to moderate left
and moderate to severe right C6 foraminal stenosis.

C6-C7: Mild circumferential disc bulge. Mild left C7 foraminal
stenosis.

C7-T1:  Negative.

Negative visible upper thoracic spine.
IMPRESSION: 1. No osseous metastatic disease in the cervical spine.
2. Mild degenerative spinal stenosis at C4-C5 with mild spinal cord
mass effect but no cord signal abnormality.
3. Moderate to severe degenerative foraminal stenosis at the right
C5 and right greater than left C6 nerve levels.

## 2019-09-13 ENCOUNTER — Telehealth: Payer: Self-pay | Admitting: Internal Medicine

## 2019-09-13 DIAGNOSIS — C7952 Secondary malignant neoplasm of bone marrow: Secondary | ICD-10-CM | POA: Diagnosis not present

## 2019-09-13 DIAGNOSIS — J4521 Mild intermittent asthma with (acute) exacerbation: Secondary | ICD-10-CM

## 2019-09-13 DIAGNOSIS — C7951 Secondary malignant neoplasm of bone: Secondary | ICD-10-CM | POA: Diagnosis not present

## 2019-09-13 NOTE — Telephone Encounter (Signed)
   What type of DME ([place]) would like your provider to order? Portable, rechargeable neb machine   Who would like to get the DME from? [facility]/ [id number] [phone]  Last visit with PCP (>3 months requires and appointment for insurance to cover the cost = please schedule patient for visit to discuss medical necessity for DME)? Last appt 08/31/19

## 2019-09-13 NOTE — Telephone Encounter (Signed)
Rx for neb has been printed.

## 2019-09-13 NOTE — Telephone Encounter (Signed)
Patient called back. I placed pt on hold while I stopped the clinic and went to the fax machine to fax the form he stated "we cant seem to get a signature on".  Faxed rx to [place] at the fax number listed.  Pt informed rx has been faxed.

## 2019-09-19 ENCOUNTER — Other Ambulatory Visit: Payer: Self-pay

## 2019-09-21 ENCOUNTER — Ambulatory Visit (INDEPENDENT_AMBULATORY_CARE_PROVIDER_SITE_OTHER): Payer: Medicare Other | Admitting: Endocrinology

## 2019-09-21 ENCOUNTER — Encounter: Payer: Self-pay | Admitting: Endocrinology

## 2019-09-21 ENCOUNTER — Other Ambulatory Visit: Payer: Self-pay

## 2019-09-21 VITALS — BP 100/64 | HR 89 | Ht 68.0 in | Wt 205.4 lb

## 2019-09-21 DIAGNOSIS — E871 Hypo-osmolality and hyponatremia: Secondary | ICD-10-CM | POA: Diagnosis not present

## 2019-09-21 DIAGNOSIS — R0600 Dyspnea, unspecified: Secondary | ICD-10-CM | POA: Insufficient documentation

## 2019-09-21 LAB — BASIC METABOLIC PANEL
BUN: 13 mg/dL (ref 6–23)
CO2: 27 mEq/L (ref 19–32)
Calcium: 9.9 mg/dL (ref 8.4–10.5)
Chloride: 98 mEq/L (ref 96–112)
Creatinine, Ser: 0.84 mg/dL (ref 0.40–1.50)
GFR: 91.52 mL/min (ref >60.00)
Glucose, Bld: 111 mg/dL — ABNORMAL HIGH (ref 70–99)
Potassium: 4.3 mEq/L (ref 3.5–5.1)
Sodium: 134 mEq/L — ABNORMAL LOW (ref 135–145)

## 2019-09-21 LAB — URINALYSIS, ROUTINE W REFLEX MICROSCOPIC
Bilirubin Urine: NEGATIVE
Hgb urine dipstick: NEGATIVE
Ketones, ur: NEGATIVE
Leukocytes,Ua: NEGATIVE
Nitrite: NEGATIVE
RBC / HPF: NONE SEEN (ref 0–?)
Specific Gravity, Urine: 1.01 (ref 1.000–1.030)
Total Protein, Urine: NEGATIVE
Urine Glucose: NEGATIVE
Urobilinogen, UA: 0.2 (ref 0.0–1.0)
WBC, UA: NONE SEEN (ref 0–?)
pH: 6.5 (ref 5.0–8.0)

## 2019-09-21 LAB — BRAIN NATRIURETIC PEPTIDE: Pro B Natriuretic peptide (BNP): 69 pg/mL (ref 0.0–100.0)

## 2019-09-21 NOTE — Progress Notes (Signed)
Subjective:    Patient ID: [name] Holder, male    DOB: 1955-09-22, 64 y.o.   MRN: [id number]  HPI Pt is referred by DR [name], for hyponatremia.  Pt was first noted to have hyponatremia in 2019.  He has no h/o ACEI, adrenal dz, SIADH, cirrhosis, CHF, thyroid dz, nephrotic synd, brain injury, pseudo, severe pulm dz, SSRI, or GBS.  he receives chemotx for prost ca.  He takes lasix.  He takes chronic prednisone for chemotx, to prevent adrenal insuff.   He has chronic back pain, due to comp fxs.  He takes NaCl 1 gram BID.   Past Medical History:  Diagnosis Date  . Allergic rhinitis   . At risk for sleep apnea    STOP-BANG SCORE = 5    (routed to pt's pcp 09-08-2018)  . Chronic constipation   . Chronic low back pain   . Chronic pain   . DOE (dyspnea on exertion)   . History of kidney stones   . History of recent pneumonia 08/10/2018   acute bronchopneumonia;  follow-up cxr 08-28-2018 in epic  . History of vertebral compression fracture    T10 and T12 s/p kyphoplasty  . Hx of colonic polyps   . Hyperplasia of prostate with lower urinary tract symptoms (LUTS)   . Hypertension   . Mild persistent asthma    followed by pcp  . Numbness in both legs    secondary to a fall, per pt has had work-up and couldn't find anything  . Osteopenia   . Prostate cancer metastatic to pelvis [facility]) urologist-- dr borden/  oncologist -- dr Alen Blew   dx 2014 advanced prostate cancer with pelvid adenopathy,  Stage T2c, Gleason 7;  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  . Wears glasses     Past Surgical History:  Procedure Laterality Date  . COLONOSCOPY    . IR KYPHO EA ADDL LEVEL THORACIC OR LUMBAR  08/17/2018  . IR KYPHO EA ADDL LEVEL THORACIC OR LUMBAR  08/26/2018  . IR KYPHO LUMBAR INC FX REDUCE BONE BX UNI/BIL CANNULATION INC/IMAGING  08/26/2018  . IR KYPHO THORACIC WITH BONE BIOPSY  08/17/2018  . IR RADIOLOGIST EVAL & MGMT  08/11/2018  . IR RADIOLOGIST EVAL & MGMT  08/24/2018  . LYMPHADENECTOMY Left 01/05/2013   Procedure: ROBOTIC LYMPHADENECTOMY;  Surgeon: Dutch Gray, MD;  Location: [facility];  Service: Urology;  Laterality: Left;  . PROSTATE BIOPSY     11/12 positive biopsies  . ROTATOR CUFF REPAIR Left 2012  . TRANSURETHRAL RESECTION OF BLADDER TUMOR N/A 09/15/2018   Procedure: TRANSURETHRAL RESECTION OF BLADDER TUMOR (TURBT)WITH CYSTOSCOPY/ POST OPERATIVE INSTILLATION OF GEMCITABINE;  Surgeon: Raynelle Bring, MD;  Location: [facility];  Service: Urology;  Laterality: N/A;  GENERAL ANESTHESIA WITH PARALYSIS  . UMBILICAL HERNIA REPAIR  09-02-2001   dr t. price '@[facility]'$     Social History   Socioeconomic History  . Marital status: Married    Spouse name: Not on file  . Number of children: Not on file  . Years of education: 55  . Highest education level: Not on file  Occupational History  . 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 Smoking status: Former Smoker    Packs/day: 0.50    Years: 25.00    Pack years: 12.50    Types: Cigarettes    Quit date: 09/08/2012    Years since quitting: 7.0  . Smokeless tobacco: Never Used  Substance and Sexual Activity  . Alcohol use: Not Currently    Alcohol/week: 2.0 standard drinks    Types: 2 Glasses of wine per week  . Drug use: Never  . Sexual activity: Not on file  Other Topics Concern  . Not on file  Social History Narrative   Regular exercise-yes   Caffeine Use-yes   Social Determinants of Health   Financial Resource Strain:   . Difficulty of Paying Living Expenses: Not on file  Food Insecurity:   . Worried About Charity fundraiser in the Last Year: Not on file  . Ran Out of Food in the Last Year: Not on file  Transportation Needs:   . Lack of Transportation (Medical): Not on file  . Lack of Transportation (Non-Medical): Not on file  Physical Activity:   . Days of Exercise per Week: Not on file  . Minutes of Exercise per Session: Not on file  Stress:   . Feeling of Stress : Not on file   Social Connections:   . Frequency of Communication with Friends and Family: Not on file  . Frequency of Social Gatherings with Friends and Family: Not on file  . Attends Religious Services: Not on file  . Active Member of Clubs or Organizations: Not on file  . Attends Archivist Meetings: Not on file  . Marital Status: Not on file  Intimate Partner Violence:   . Fear of Current or Ex-Partner: Not on file  . Emotionally Abused: Not on file  . Physically Abused: Not on file  . Sexually Abused: Not on file    Current Outpatient Medications on File Prior to Visit  Medication Sig Dispense Refill  . abiraterone acetate (ZYTIGA) 250 MG tablet Take 1,000 mg by mouth daily.   10  . acetaminophen (TYLENOL) 325 MG tablet Take 325 mg by mouth daily as needed for fever.    [name] albuterol (PROAIR HFA) 108 (90 Base) MCG/ACT inhaler 2 PUFFS EVERY 6 HOURS AS NEEDED FOR WHEEZING (Patient taking differently: Inhale 2 puffs into the lungs every 6 (six) hours as needed for wheezing. 2 PUFFS EVERY 6 HOURS AS NEEDED FOR WHEEZING) 8.5 g 5  . albuterol (PROVENTIL) (2.5 MG/3ML) 0.083% nebulizer solution NEBULIZE 1 VIAL EVERY 6-8 HOURS AS NEEDED FOR WHEEZING OR SHORTNESS OF BREATH (Patient taking differently: Take 2.5 mg by nebulization every 6 (six) hours as needed for wheezing or shortness of breath. NEBULIZE 1 VIAL EVERY 6-8 HOURS AS NEEDED FOR WHEEZING OR SHORTNESS OF BREATH) 180 mL 3  . Azelastine HCl 137 MCG/SPRAY SOLN USE 1 TO 2 SPRAYS IN EACH NOSTRIL EVERY 12 HOURS 30 mL 1  . b complex vitamins tablet Take 1 tablet by mouth daily.    [name] CALCIUM-VITAMIN D PO Take 1 tablet by mouth 2 (two) times daily.     . carvedilol (COREG) 3.125 MG tablet Take 2 tablets (6.25 mg total) by mouth 2 (two) times daily with a meal.  1  . furosemide (LASIX) 20 MG tablet Take 1 tablet (20 mg total) by mouth daily. 30 tablet 0  . ipratropium-albuterol (DUONEB) 0.5-2.5 (3) MG/3ML SOLN Take 3 mLs by nebulization every 4 (four)  hours as needed. (Patient taking differently: Take 3 mLs by nebulization every 4 (four) hours as needed (wheezing/SOB). ) 360 mL 1  . Leuprolide Acetate (LUPRON IJ) Inject 1 Dose as directed every 4 (four) months. This is administered by [facility]    . mometasone (NASONEX) 50 MCG/ACT nasal spray USE  2 SPRAYS IN EACH NOSTRIL ONCE DAILY. (Patient taking differently: Place 2 sprays into the nose daily. ) 51 g 0  . Multiple Vitamins-Minerals (CENTRUM SILVER 50+MEN PO) Take 1 tablet by mouth daily.    [name] NARCAN 4 MG/0.1ML LIQD nasal spray kit Place 0.4 mg into the nose once as needed (overdose).     [name] olmesartan (BENICAR) 20 MG tablet Take 1 tablet (20 mg total) by mouth 2 (two) times daily. 60 tablet 1  . oxyCODONE (OXY IR/ROXICODONE) 5 MG immediate release tablet Take 1 tablet (5 mg total) by mouth every 4 (four) hours as needed for severe pain. (Patient taking differently: Take 2.5 mg by mouth every 4 (four) hours as needed for severe pain. ) 180 tablet 0  . predniSONE (DELTASONE) 5 MG tablet Take 1 tablet (5 mg total) by mouth daily with breakfast. (Patient taking differently: Take 5 mg by mouth 2 (two) times daily with a meal. )    . solifenacin (VESICARE) 5 MG tablet Take 5 mg by mouth daily.    . SYMBICORT 160-4.5 MCG/ACT inhaler Inhale 2 puffs into the lungs 2 (two) times daily. (Patient taking differently: Inhale 2 puffs into the lungs 2 (two) times daily. ) 30.6 g 3  . SYRINGE-NEEDLE, DISP, 3 ML 3 ML MISC Use to inject b12 monthly. 12 each 0   No current facility-administered medications on file prior to visit.    Allergies  Allergen Reactions  . Amlodipine Swelling    edema  . Tetracycline Rash    Family History  Problem Relation Age of Onset  . Arthritis Mother   . Diabetes Mother   . Hypertension Mother   . Hyperlipidemia Mother   . Hypertension Father   . Prostate cancer Father        radiation in mid 39s  . Heart attack Father   . Cancer Sister        precancerous  colon polyps, 6 in colon removed  . Stroke Maternal Grandfather   . Other Neg Hx        hyponatremia    BP 100/64 (BP Location: Left Arm, Patient Position: Sitting, Cuff Size: Large)   Pulse 89   Ht '5\' 8"'$  (1.727 m)   Wt 205 lb 6.4 oz (93.2 kg)   SpO2 98%   BMI 31.23 kg/m   Review of Systems Denies weight change, numbness, visual loss, n/v, polyuria, memory loss, and headache.  He has intermitt mild sob.  Wife says pt has intermitt restlessness, difficulty with concentration, and severe fatigue.      Objective:   Physical Exam VS: see vs page GEN: no distress.  In wheelchair HEAD: head: no deformity eyes: no periorbital swelling, no proptosis external nose and ears are normal NECK: supple, thyroid is not enlarged CHEST WALL: no deformity LUNGS: clear to auscultation CV: reg rate and rhythm, no murmur.  MUSCULOSKELETAL: muscle bulk and strength are grossly normal.  no obvious joint swelling.  gait is normal and steady EXTEMITIES: no deformity.  no edema PULSES: no carotid bruit NEURO:  cn 2-12 grossly intact.   readily moves all 4's.  sensation is intact to touch on all 4's SKIN:  Normal texture and temperature.  No rash or suspicious lesion is visible.   NODES:  None palpable at the neck PSYCH: alert, well-oriented.  Does not appear anxious nor depressed.   Lab Results  Component Value Date   TSH 1.616 08/16/2019   Lab Results  Component Value Date  CREATININE 0.79 08/31/2019   BUN 17 08/31/2019   NA 134 (L) 08/31/2019   K 3.9 08/31/2019   CL 102 08/31/2019   CO2 23 08/31/2019    Lab Results  Component Value Date   CHOL 174 04/18/2019   HDL 69.90 04/18/2019   LDLCALC 81 04/18/2019   TRIG 115.0 04/18/2019   CHOLHDL 2 04/18/2019   ACTH stimulation test was done: baseline cortisol level=12 then Cosyntropin 250 mcg is given IM 45 minutes later, cortisol level=20 (normal response)  I have reviewed outside records, and summarized: Pt was noted to have low  Na+, and referred here.  He was in the hosp, with AMS.      Assessment & Plan:  Hyponatremia, new to me.  I need lab results to tell more about the cause.  HTN: overcontrolled.   Patient Instructions  Blood tests are requested for you today.  We'll let you know about the results.  Based on the results, we may increase the salt tablets, or add another medication. Your blood pressure is borderline low today.  Please see your primary care provider soon, to have it rechecked.  If he wants to reduce BP meds, that is fine with me.   You should limit how much fluid you drink (less than 2 quarts per day).  The easiest is to not drink during meals.

## 2019-09-21 NOTE — Patient Instructions (Addendum)
Blood tests are requested for you today.  We'll let you know about the results.  Based on the results, we may increase the salt tablets, or add another medication. Your blood pressure is borderline low today.  Please see your primary care provider soon, to have it rechecked.  If he wants to reduce BP meds, that is fine with me.   You should limit how much fluid you drink (less than 2 quarts per day).  The easiest is to not drink during meals.

## 2019-09-28 LAB — ARGININE VASOPRESSIN HORMONE
ADH: <0.8 pg/mL (ref 0.0–4.7)
Osmolality Meas: 280 mOsmol/kg (ref 280–301)

## 2019-10-02 ENCOUNTER — Other Ambulatory Visit: Payer: Self-pay | Admitting: Internal Medicine

## 2019-10-02 DIAGNOSIS — J4520 Mild intermittent asthma, uncomplicated: Secondary | ICD-10-CM

## 2019-10-04 ENCOUNTER — Telehealth: Payer: Self-pay | Admitting: Endocrinology

## 2019-10-04 NOTE — Telephone Encounter (Signed)
Patient called to advise that he is going to start taking Sodium Chloride 2 grams twice a day as recommended by Dr Loanne Drilling.

## 2019-10-04 NOTE — Telephone Encounter (Signed)
FYI

## 2019-10-11 DIAGNOSIS — C7952 Secondary malignant neoplasm of bone marrow: Secondary | ICD-10-CM | POA: Diagnosis not present

## 2019-10-11 DIAGNOSIS — C7951 Secondary malignant neoplasm of bone: Secondary | ICD-10-CM | POA: Diagnosis not present

## 2019-10-12 ENCOUNTER — Other Ambulatory Visit: Payer: Self-pay | Admitting: Internal Medicine

## 2019-10-18 ENCOUNTER — Telehealth: Payer: Self-pay | Admitting: Internal Medicine

## 2019-10-18 NOTE — Telephone Encounter (Signed)
New Message:   1.Medication Requested: furosemide (LASIX) 20 MG tablet 2. Pharmacy (Name, Street, [place]): [place], [place] 3. On Med List: Yes  4. Last Visit with PCP: 08/31/19  5. Next visit date with PCP:  Pharmacy and Patient are aware that our provider did not prescribe this medication. Agent: Please be advised that RX refills may take up to 3 business days. We ask that you follow-up with your pharmacy.

## 2019-10-18 NOTE — Telephone Encounter (Signed)
Previous rx'ed by hospital.  Hospital follow up completed with PCP Okay to continue the lasix. Please advise.

## 2019-10-19 ENCOUNTER — Other Ambulatory Visit: Payer: Self-pay | Admitting: Internal Medicine

## 2019-10-19 DIAGNOSIS — I10 Essential (primary) hypertension: Secondary | ICD-10-CM

## 2019-10-19 MED ORDER — FUROSEMIDE 20 MG PO TABS: 20.0000 mg | ORAL_TABLET | Freq: Every day | ORAL | 0 refills | Status: DC

## 2019-10-24 DIAGNOSIS — C61 Malignant neoplasm of prostate: Secondary | ICD-10-CM | POA: Diagnosis not present

## 2019-10-25 ENCOUNTER — Other Ambulatory Visit: Payer: Self-pay | Admitting: Internal Medicine

## 2019-10-25 DIAGNOSIS — Z0000 Encounter for general adult medical examination without abnormal findings: Secondary | ICD-10-CM

## 2019-10-25 DIAGNOSIS — Z9109 Other allergy status, other than to drugs and biological substances: Secondary | ICD-10-CM

## 2019-11-01 DIAGNOSIS — C775 Secondary and unspecified malignant neoplasm of intrapelvic lymph nodes: Secondary | ICD-10-CM | POA: Diagnosis not present

## 2019-11-01 DIAGNOSIS — M818 Other osteoporosis without current pathological fracture: Secondary | ICD-10-CM | POA: Diagnosis not present

## 2019-11-01 DIAGNOSIS — R351 Nocturia: Secondary | ICD-10-CM | POA: Diagnosis not present

## 2019-11-01 DIAGNOSIS — C61 Malignant neoplasm of prostate: Secondary | ICD-10-CM | POA: Diagnosis not present

## 2019-11-02 ENCOUNTER — Other Ambulatory Visit: Payer: Self-pay | Admitting: Oncology

## 2019-11-02 ENCOUNTER — Telehealth: Payer: Self-pay | Admitting: Oncology

## 2019-11-02 NOTE — Telephone Encounter (Signed)
Scheduled appt per 4/8 sch message - mailed letter with appt date and time

## 2019-11-03 ENCOUNTER — Telehealth: Payer: Self-pay

## 2019-11-03 ENCOUNTER — Other Ambulatory Visit: Payer: Self-pay | Admitting: Internal Medicine

## 2019-11-03 DIAGNOSIS — I10 Essential (primary) hypertension: Secondary | ICD-10-CM

## 2019-11-03 MED ORDER — CARVEDILOL 3.125 MG PO TABS: 3.1250 mg | ORAL_TABLET | Freq: Two times a day (BID) | ORAL | 1 refills | Status: DC

## 2019-11-03 MED ORDER — CARVEDILOL 3.125 MG PO TABS: 6.2500 mg | ORAL_TABLET | Freq: Two times a day (BID) | ORAL | 1 refills | Status: DC

## 2019-11-03 NOTE — Telephone Encounter (Signed)
Per PCP - okay to decrease to carvedilol 3.125 bid. New rx sent per PCP with note to the pharmacy to dc to 3.125 (2 tab bid).

## 2019-11-03 NOTE — Telephone Encounter (Signed)
Cliff called and stated there has been an increase in carvedilol and was calling to check on it.   After digging into the chart, the increase happened on 08/04/2019 when pt came in to see [facility] carvedilol was increased to 6.25mg  bid. The medication increase was documented and the med list was updated (but not sent to the pharmacy).   Pt followed up with PCP - diuretic was added.   According to the pharmacy record, pt has not been taking the carvedilol as most recently indicated.   Do you want to keep it at 6.25 mg bid? Or reduce it to the 3.125 mg bid?

## 2019-11-04 ENCOUNTER — Other Ambulatory Visit (HOSPITAL_COMMUNITY)
Admission: RE | Admit: 2019-11-04 | Discharge: 2019-11-04 | Disposition: A | Discharge status: Home / Self Care | Payer: Medicare Other | Source: Ambulatory Visit | Attending: Pulmonary Disease | Admitting: Pulmonary Disease

## 2019-11-04 DIAGNOSIS — Z20822 Contact with and (suspected) exposure to covid-19: Secondary | ICD-10-CM | POA: Diagnosis not present

## 2019-11-04 DIAGNOSIS — Z01812 Encounter for preprocedural laboratory examination: Secondary | ICD-10-CM | POA: Diagnosis not present

## 2019-11-04 LAB — SARS CORONAVIRUS 2 (TAT 6-24 HRS): SARS Coronavirus 2: NEGATIVE

## 2019-11-08 ENCOUNTER — Other Ambulatory Visit: Payer: Self-pay

## 2019-11-08 ENCOUNTER — Ambulatory Visit (INDEPENDENT_AMBULATORY_CARE_PROVIDER_SITE_OTHER): Payer: Medicare Other | Admitting: Pulmonary Disease

## 2019-11-08 ENCOUNTER — Ambulatory Visit: Payer: Medicare Other | Admitting: Pulmonary Disease

## 2019-11-08 ENCOUNTER — Encounter: Payer: Self-pay | Admitting: Pulmonary Disease

## 2019-11-08 VITALS — BP 116/70 | HR 87 | Temp 97.2°F | Ht 68.5 in | Wt 212.0 lb

## 2019-11-08 DIAGNOSIS — Z8701 Personal history of pneumonia (recurrent): Secondary | ICD-10-CM

## 2019-11-08 DIAGNOSIS — J4520 Mild intermittent asthma, uncomplicated: Secondary | ICD-10-CM

## 2019-11-08 DIAGNOSIS — E871 Hypo-osmolality and hyponatremia: Secondary | ICD-10-CM

## 2019-11-08 DIAGNOSIS — R911 Solitary pulmonary nodule: Secondary | ICD-10-CM | POA: Diagnosis not present

## 2019-11-08 DIAGNOSIS — Z87891 Personal history of nicotine dependence: Secondary | ICD-10-CM

## 2019-11-08 DIAGNOSIS — S22000A Wedge compression fracture of unspecified thoracic vertebra, initial encounter for closed fracture: Secondary | ICD-10-CM

## 2019-11-08 LAB — PULMONARY FUNCTION TEST
DL/VA % pred: 90 %
DL/VA: 3.79 ml/min/mmHg/L
DLCO cor % pred: 80 %
DLCO cor: 20.52 ml/min/mmHg
DLCO unc % pred: 80 %
DLCO unc: 20.52 ml/min/mmHg
FEF 25-75 Post: 2.09 L/sec
FEF 25-75 Pre: 1.75 L/sec
FEF2575-%Change-Post: 19 %
FEF2575-%Pred-Post: 80 %
FEF2575-%Pred-Pre: 67 %
FEV1-%Change-Post: 3 %
FEV1-%Pred-Post: 72 %
FEV1-%Pred-Pre: 69 %
FEV1-Post: 2.35 L
FEV1-Pre: 2.27 L
FEV1FVC-%Change-Post: 2 %
FEV1FVC-%Pred-Pre: 100 %
FEV6-%Change-Post: 1 %
FEV6-%Pred-Post: 74 %
FEV6-%Pred-Pre: 73 %
FEV6-Post: 3.07 L
FEV6-Pre: 3.02 L
FEV6FVC-%Change-Post: 0 %
FEV6FVC-%Pred-Post: 105 %
FEV6FVC-%Pred-Pre: 105 %
FVC-%Change-Post: 1 %
FVC-%Pred-Post: 70 %
FVC-%Pred-Pre: 69 %
FVC-Post: 3.07 L
FVC-Pre: 3.02 L
Post FEV1/FVC ratio: 77 %
Post FEV6/FVC ratio: 100 %
Pre FEV1/FVC ratio: 75 %
Pre FEV6/FVC Ratio: 100 %
RV % pred: 145 %
RV: 3.3 L
TLC % pred: 97 %
TLC: 6.52 L

## 2019-11-08 MED ORDER — STIOLTO RESPIMAT 2.5-2.5 MCG/ACT IN AERS: 2.0000 | INHALATION_SPRAY | Freq: Every day | RESPIRATORY_TRACT | 0 refills | Status: DC

## 2019-11-08 NOTE — Addendum Note (Signed)
Addended by: Elton Sin on: 11/08/2019 05:26 PM   Modules accepted: Orders

## 2019-11-08 NOTE — Progress Notes (Signed)
Full PFT performed today. °

## 2019-11-08 NOTE — Progress Notes (Signed)
Synopsis: Follow up from hospitalization Feb 2021, PCP: [name], MD  Subjective:   PATIENT ID: [name] Holder GENDER: male DOB: 10-01-1955, MRN: [id number]  Chief Complaint  Patient presents with  . Follow-up    PFT review and results    PMH Asthma, Former smoker, quit 5 years, smoked on and off since college, heaviest 1 pack per day, menthol newports. H/o of prostate cancer, and tumor removal, bladder cancer. On prednisone '5mg'$  daily, leupron and abaraterone.  Patient was initially seen by myself in consultation on 08/16/2019 in the [facility] emergency department.  He is admitted to the hospital for hallucinations and found to be hyponatremic.  He has a history of prostate cancer on chronic prednisone.  His CT scan on admission had a right middle lobe nodular opacity that was matched on chest x-ray imaging concerning for community-acquired pneumonia.  He was treated by outpatient provider with Bactrim.  He is a former smoker quit 5 years ago as stated above.  Has a history of asthma as a child.  Currently managed with duo nebs.  Ultimately the decision was made that the patient be brought to the intensive care unit placed on 3% hypertonic saline.  He had improvement of his mentation slowly.  There was also concern for possible cross-reactivity from taking extra doses of his opiate pain medication.  OV 08/31/2019: Patient here today for follow-up regarding the abnormalities on his CAT scan imaging as well as history of asthma possible COPD from his longstanding history of smoking.  Overall doing great in comparison to where he was at his last evaluation.  He does have shortness of breath with exertion.  He does present today in a wheelchair.  Wife is with him.  OV 11/08/2019: Patient here for follow-up after pulmonary function test.  PFTs completed prior to office visit.  Pulmonary function test completed with a reduced FEV1 and FVC at 69% predicted, no significant bronchodilator response  RV with 145% predicted, DLCO 80% predicted.  Patient has been seen by endocrinology started on salt tablet replacement.  He has a CT scan planned for follow-up with his right middle lobe consolidation which I believe to be community-acquired pneumonia related to his hospitalization however we need to ensure resolution of this.  He is currently managed with Symbicort.  Was recently changed to a new prostate cancer medication.  Overall doing well.   Past Medical History:  Diagnosis Date  . Allergic rhinitis   . At risk for sleep apnea    STOP-BANG SCORE = 5    (routed to pt's pcp 09-08-2018)  . Chronic constipation   . Chronic low back pain   . Chronic pain   . DOE (dyspnea on exertion)   . History of kidney stones   . History of recent pneumonia 08/10/2018   acute bronchopneumonia;  follow-up cxr 08-28-2018 in epic  . History of vertebral compression fracture    T10 and T12 s/p kyphoplasty  . Hx of colonic polyps   . Hyperplasia of prostate with lower urinary tract symptoms (LUTS)   . Hypertension   . Mild persistent asthma    followed by pcp  . Numbness in both legs    secondary to a fall, per pt has had work-up and couldn't find anything  . Osteopenia   . Prostate cancer metastatic to pelvis [facility]) urologist-- dr borden/  oncologist -- dr Alen Blew   dx 2014 advanced prostate cancer with pelvid adenopathy,  Stage T2c, Gleason 7;  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  . Wears glasses      Family History  Problem Relation Age of Onset  . Arthritis Mother   . Diabetes Mother   . Hypertension Mother   . Hyperlipidemia Mother   . Hypertension Father   . Prostate cancer Father        radiation in mid 47s  . Heart attack Father   . Cancer Sister        precancerous colon polyps, 6 in colon removed  . Stroke Maternal Grandfather   . Other Neg Hx        hyponatremia     Past Surgical History:  Procedure Laterality Date  . COLONOSCOPY    . IR KYPHO EA ADDL LEVEL THORACIC OR LUMBAR   08/17/2018  . IR KYPHO EA ADDL LEVEL THORACIC OR LUMBAR  08/26/2018  . IR KYPHO LUMBAR INC FX REDUCE BONE BX UNI/BIL CANNULATION INC/IMAGING  08/26/2018  . IR KYPHO THORACIC WITH BONE BIOPSY  08/17/2018  . IR RADIOLOGIST EVAL & MGMT  08/11/2018  . IR RADIOLOGIST EVAL & MGMT  08/24/2018  . LYMPHADENECTOMY Left 01/05/2013   Procedure: ROBOTIC LYMPHADENECTOMY;  Surgeon: Dutch Gray, MD;  Location: [facility];  Service: Urology;  Laterality: Left;  . PROSTATE BIOPSY     11/12 positive biopsies  . ROTATOR CUFF REPAIR Left 2012  . TRANSURETHRAL RESECTION OF BLADDER TUMOR N/A 09/15/2018   Procedure: TRANSURETHRAL RESECTION OF BLADDER TUMOR (TURBT)WITH CYSTOSCOPY/ POST OPERATIVE INSTILLATION OF GEMCITABINE;  Surgeon: Raynelle Bring, MD;  Location: [facility];  Service: Urology;  Laterality: N/A;  GENERAL ANESTHESIA WITH PARALYSIS  . UMBILICAL HERNIA REPAIR  09-02-2001   dr t. price '@[facility]     Social History   Socioeconomic History  . Marital status: Married    Spouse name: Not on file  . Number of children: Not on file  . Years of education: 70  . Highest education level: Not on file  Occupational History  . 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 Smoking status: Former Smoker    Packs/day: 0.50    Years: 25.00    Pack years: 12.50    Types: Cigarettes    Quit date: 09/08/2012    Years since quitting: 7.1  . Smokeless tobacco: Never Used  Substance and Sexual Activity  . Alcohol use: Not Currently    Alcohol/week: 2.0 standard drinks    Types: 2 Glasses of wine per week  . Drug use: Never  . Sexual activity: Not on file  Other Topics Concern  . Not on file  Social History Narrative   Regular exercise-yes   Caffeine Use-yes   Social Determinants of Health   Financial Resource Strain:   . Difficulty of Paying Living Expenses:   Food Insecurity:   . Worried About Charity fundraiser in the Last Year:   . Arboriculturist in the Last Year:    Transportation Needs:   . Film/video editor (Medical):   [name] Lack of Transportation (Non-Medical):   Physical Activity:   . Days of Exercise per Week:   . Minutes of Exercise per Session:   Stress:   . Feeling of Stress :   Social Connections:   . Frequency of Communication with Friends and Family:   . Frequency of Social Gatherings with Friends and Family:   . Attends Religious Services:   . Active Member of Clubs or Organizations:   .  Attends Archivist Meetings:   [name] Marital Status:   Intimate Partner Violence:   . Fear of Current or Ex-Partner:   . Emotionally Abused:   [name] Physically Abused:   . Sexually Abused:      Allergies  Allergen Reactions  . Amlodipine Swelling    edema  . Tetracycline Rash     Outpatient Medications Prior to Visit  Medication Sig Dispense Refill  . sodium chloride 1 g tablet Take 2 g by mouth 2 (two) times daily with a meal.    . abiraterone acetate (ZYTIGA) 250 MG tablet Take 1,000 mg by mouth daily.   10  . acetaminophen (TYLENOL) 325 MG tablet Take 325 mg by mouth daily as needed for fever.    [name] albuterol (PROAIR HFA) 108 (90 Base) MCG/ACT inhaler 2 PUFFS EVERY 6 HOURS AS NEEDED FOR WHEEZING (Patient taking differently: Inhale 2 puffs into the lungs every 6 (six) hours as needed for wheezing. 2 PUFFS EVERY 6 HOURS AS NEEDED FOR WHEEZING) 8.5 g 5  . albuterol (PROVENTIL) (2.5 MG/3ML) 0.083% nebulizer solution NEBULIZE 1 VIAL EVERY 6-8 HOURS AS NEEDED FOR WHEEZING OR SHORTNESS OF BREATH (Patient taking differently: Take 2.5 mg by nebulization every 6 (six) hours as needed for wheezing or shortness of breath. NEBULIZE 1 VIAL EVERY 6-8 HOURS AS NEEDED FOR WHEEZING OR SHORTNESS OF BREATH) 180 mL 3  . Azelastine HCl 137 MCG/SPRAY SOLN USE 1 TO 2 SPRAYS IN EACH NOSTRIL EVERY 12 HOURS 30 mL 1  . b complex vitamins tablet Take 1 tablet by mouth daily.    [name] CALCIUM-VITAMIN D PO Take 1 tablet by mouth 2 (two) times daily.     . carvedilol  (COREG) 3.125 MG tablet Take 1 tablet (3.125 mg total) by mouth 2 (two) times daily with a meal. 90 tablet 1  . furosemide (LASIX) 20 MG tablet Take 1 tablet (20 mg total) by mouth daily. 90 tablet 0  . ipratropium-albuterol (DUONEB) 0.5-2.5 (3) MG/3ML SOLN Take 3 mLs by nebulization every 4 (four) hours as needed (wheezing/SOB). 360 mL 1  . Leuprolide Acetate (LUPRON IJ) Inject 1 Dose as directed every 4 (four) months. This is administered by [facility]    . mometasone (NASONEX) 50 MCG/ACT nasal spray USE 2 SPRAYS IN EACH NOSTRIL ONCE DAILY. (Patient taking differently: Place 2 sprays into the nose daily. ) 51 g 0  . Multiple Vitamins-Minerals (CENTRUM SILVER 50+MEN PO) Take 1 tablet by mouth daily.    [name] NARCAN 4 MG/0.1ML LIQD nasal spray kit Place 0.4 mg into the nose once as needed (overdose).     [name] olmesartan (BENICAR) 40 MG tablet TAKE 1 TABLET DAILY 90 tablet 0  . oxyCODONE (OXY IR/ROXICODONE) 5 MG immediate release tablet Take 1 tablet (5 mg total) by mouth every 4 (four) hours as needed for severe pain. (Patient taking differently: Take 2.5 mg by mouth every 4 (four) hours as needed for severe pain. ) 180 tablet 0  . predniSONE (DELTASONE) 5 MG tablet Take 1 tablet (5 mg total) by mouth daily with breakfast. (Patient taking differently: Take 5 mg by mouth 2 (two) times daily with a meal. )    . solifenacin (VESICARE) 5 MG tablet Take 5 mg by mouth daily.    . SYMBICORT 160-4.5 MCG/ACT inhaler Inhale 2 puffs into the lungs 2 (two) times daily. (Patient taking differently: Inhale 2 puffs into the lungs 2 (two) times daily. ) 30.6 g 3  . olmesartan (BENICAR) 20  MG tablet Take 1 tablet (20 mg total) by mouth 2 (two) times daily. 60 tablet 1  . SYRINGE-NEEDLE, DISP, 3 ML 3 ML MISC Use to inject b12 monthly. 12 each 0   No facility-administered medications prior to visit.    Review of Systems  Constitutional: Negative for chills, fever, malaise/fatigue and weight loss.  HENT: Negative for  hearing loss, sore throat and tinnitus.   Eyes: Negative for blurred vision and double vision.  Respiratory: Positive for shortness of breath. Negative for cough, hemoptysis, sputum production, wheezing and stridor.   Cardiovascular: Negative for chest pain, palpitations, orthopnea, leg swelling and PND.  Gastrointestinal: Negative for abdominal pain, constipation, diarrhea, heartburn, nausea and vomiting.  Genitourinary: Negative for dysuria, hematuria and urgency.  Musculoskeletal: Negative for joint pain and myalgias.  Skin: Negative for itching and rash.  Neurological: Negative for dizziness, tingling, weakness and headaches.  Endo/Heme/Allergies: Negative for environmental allergies. Does not bruise/bleed easily.  Psychiatric/Behavioral: Negative for depression. The patient is not nervous/anxious and does not have insomnia.   All other systems reviewed and are negative.    Objective:  Physical Exam Vitals reviewed.  Constitutional:      General: He is not in acute distress.    Appearance: He is well-developed. He is obese.  HENT:     Head: Normocephalic and atraumatic.  Eyes:     General: No scleral icterus.    Conjunctiva/sclera: Conjunctivae normal.     Pupils: Pupils are equal, round, and reactive to light.  Neck:     Vascular: No JVD.     Trachea: No tracheal deviation.  Cardiovascular:     Rate and Rhythm: Normal rate and regular rhythm.     Heart sounds: Normal heart sounds. No murmur.  Pulmonary:     Effort: Pulmonary effort is normal. No tachypnea, accessory muscle usage or respiratory distress.     Breath sounds: Normal breath sounds. No stridor. No wheezing, rhonchi or rales.  Abdominal:     General: Bowel sounds are normal. There is no distension.     Palpations: Abdomen is soft.     Tenderness: There is no abdominal tenderness.  Musculoskeletal:        General: No tenderness.     Cervical back: Neck supple.     Comments: Thoracic kyphosis  Lymphadenopathy:      Cervical: No cervical adenopathy.  Skin:    General: Skin is warm and dry.     Capillary Refill: Capillary refill takes less than 2 seconds.     Findings: No rash.  Neurological:     Mental Status: He is alert and oriented to person, place, and time.  Psychiatric:        Behavior: Behavior normal.      Vitals:   11/08/19 1456  BP: 116/70  Pulse: 87  Temp: (!) 97.2 F (36.2 C)  TempSrc: Temporal  SpO2: 97%  Weight: 212 lb (96.2 kg)  Height: 5' 8.5" (1.74 m)   97% on RA BMI Readings from Last 3 Encounters:  11/08/19 31.77 kg/m  09/21/19 31.23 kg/m  08/31/19 30.90 kg/m   Wt Readings from Last 3 Encounters:  11/08/19 212 lb (96.2 kg)  09/21/19 205 lb 6.4 oz (93.2 kg)  08/31/19 203 lb 3.2 oz (92.2 kg)     CBC    Component Value Date/Time   WBC 9.4 08/31/2019 1354   RBC 3.96 (L) 08/31/2019 1354   HGB 12.4 (L) 08/31/2019 1354   HGB 13.4 04/14/2019 0855  HCT 36.4 (L) 08/31/2019 1354   PLT 433.0 (H) 08/31/2019 1354   PLT 244 04/14/2019 0855   MCV 91.9 08/31/2019 1354   MCH 32.0 08/16/2019 0436   MCHC 34.0 08/31/2019 1354   RDW 13.2 08/31/2019 1354   LYMPHSABS 2.1 08/31/2019 1354   MONOABS 0.8 08/31/2019 1354   EOSABS 0.0 08/31/2019 1354   BASOSABS 0.1 08/31/2019 1354     Chest Imaging: 08/09/2019 CT chest: Patient with right middle lobe nodular opacity underlying malignancy not excluded.  Likely inflammatory related.  Possible community-acquired pneumonia. The patient's images have been independently reviewed by me.    Pulmonary Functions Testing Results: PFT Results Latest Ref Rng & Units 11/08/2019 07/25/2013  FVC-Pre L 3.02 4.60  FVC-Predicted Pre % 69 101  FVC-Post L 3.07 4.61  FVC-Predicted Post % 70 101  Pre FEV1/FVC % % 75 70  Post FEV1/FCV % % 77 72  FEV1-Pre L 2.27 3.21  FEV1-Predicted Pre % 69 93  FEV1-Post L 2.35 3.31  DLCO UNC% % 80 109  DLCO COR %Predicted % 90 94  TLC L 6.52 6.29  TLC % Predicted % 97 94  RV % Predicted % 145  73       Assessment & Plan:     ICD-10-CM   1. Solitary pulmonary nodule  R91.1   2. History of community acquired pneumonia  Z87.01   3. Former smoker  Z87.891   4. Mild intermittent asthma, unspecified whether complicated  J18.84   5. Hyponatremia  E87.1   6. Compression fracture of body of thoracic vertebra (HCC)  S22.000A     Discussion: This is a 64 year old gentleman right middle lobe nodular opacity treat for community-acquired pneumonia, former smoker, quit 5 years ago.  He had a noncontrasted CT imaging plan for this is yet to be completed.  Patient here today for follow-up after PFTs.  I personally reviewed and interpreted pulmonary function test: Mixed obstructive and restrictive defect.  He has a reduced FEV1 and FVC.  This I believe related to his longstanding history of smoking in conjunction with his weight and he also has kyphosis of the thoracic spine.  With a mixed pattern.  He has a RV consistent with air trapping and 145% predicted, normal DLCO 80% predicted.  Plan: Patient needs noncontrasted CT imaging of the chest to complete for review of lung nodule. This was already ordered has yet to be completed. We will call patient with results and come up with plan on next steps based on imaging results. We discussed the risk of being on Symbicort and development of getting car pneumonia with ICS. He does have some allergy history but we did make the decision to switch to [employer]. We also discussed risk of urinary retention related to [place].  He has been using ipratropium as needed with no issues. Recommend stopping use of DuoNeb Stop Symbicort Continue use of albuterol as needed Start Stiolto  We will have a telephone visit to discuss how his symptoms are with new inhaler regimen to be completed in approximately 6 weeks.  I will not be in the office in 6 weeks from now.  We will set up with Wyn Quaker, NP.   Greater than 50% of this patient's 31-minute office was  spent face-to-face discussing above recommendations and treatment plan.   Current Outpatient Medications:  .  sodium chloride 1 g tablet, Take 2 g by mouth 2 (two) times daily with a meal., Disp: , Rfl:  .  abiraterone acetate (  ZYTIGA) 250 MG tablet, Take 1,000 mg by mouth daily. , Disp: , Rfl: 10 .  acetaminophen (TYLENOL) 325 MG tablet, Take 325 mg by mouth daily as needed for fever., Disp: , Rfl:  .  albuterol (PROAIR HFA) 108 (90 Base) MCG/ACT inhaler, 2 PUFFS EVERY 6 HOURS AS NEEDED FOR WHEEZING (Patient taking differently: Inhale 2 puffs into the lungs every 6 (six) hours as needed for wheezing. 2 PUFFS EVERY 6 HOURS AS NEEDED FOR WHEEZING), Disp: 8.5 g, Rfl: 5 .  albuterol (PROVENTIL) (2.5 MG/3ML) 0.083% nebulizer solution, NEBULIZE 1 VIAL EVERY 6-8 HOURS AS NEEDED FOR WHEEZING OR SHORTNESS OF BREATH (Patient taking differently: Take 2.5 mg by nebulization every 6 (six) hours as needed for wheezing or shortness of breath. NEBULIZE 1 VIAL EVERY 6-8 HOURS AS NEEDED FOR WHEEZING OR SHORTNESS OF BREATH), Disp: 180 mL, Rfl: 3 .  Azelastine HCl 137 MCG/SPRAY SOLN, USE 1 TO 2 SPRAYS IN EACH NOSTRIL EVERY 12 HOURS, Disp: 30 mL, Rfl: 1 .  b complex vitamins tablet, Take 1 tablet by mouth daily., Disp: , Rfl:  .  CALCIUM-VITAMIN D PO, Take 1 tablet by mouth 2 (two) times daily. , Disp: , Rfl:  .  carvedilol (COREG) 3.125 MG tablet, Take 1 tablet (3.125 mg total) by mouth 2 (two) times daily with a meal., Disp: 90 tablet, Rfl: 1 .  furosemide (LASIX) 20 MG tablet, Take 1 tablet (20 mg total) by mouth daily., Disp: 90 tablet, Rfl: 0 .  ipratropium-albuterol (DUONEB) 0.5-2.5 (3) MG/3ML SOLN, Take 3 mLs by nebulization every 4 (four) hours as needed (wheezing/SOB)., Disp: 360 mL, Rfl: 1 .  Leuprolide Acetate (LUPRON IJ), Inject 1 Dose as directed every 4 (four) months. This is administered by [facility], Disp: , Rfl:  .  mometasone (NASONEX) 50 MCG/ACT nasal spray, USE 2 SPRAYS IN EACH NOSTRIL ONCE  DAILY. (Patient taking differently: Place 2 sprays into the nose daily. ), Disp: 51 g, Rfl: 0 .  Multiple Vitamins-Minerals (CENTRUM SILVER 50+MEN PO), Take 1 tablet by mouth daily., Disp: , Rfl:  .  NARCAN 4 MG/0.1ML LIQD nasal spray kit, Place 0.4 mg into the nose once as needed (overdose). , Disp: , Rfl:  .  olmesartan (BENICAR) 40 MG tablet, TAKE 1 TABLET DAILY, Disp: 90 tablet, Rfl: 0 .  oxyCODONE (OXY IR/ROXICODONE) 5 MG immediate release tablet, Take 1 tablet (5 mg total) by mouth every 4 (four) hours as needed for severe pain. (Patient taking differently: Take 2.5 mg by mouth every 4 (four) hours as needed for severe pain. ), Disp: 180 tablet, Rfl: 0 .  predniSONE (DELTASONE) 5 MG tablet, Take 1 tablet (5 mg total) by mouth daily with breakfast. (Patient taking differently: Take 5 mg by mouth 2 (two) times daily with a meal. ), Disp: , Rfl:  .  solifenacin (VESICARE) 5 MG tablet, Take 5 mg by mouth daily., Disp: , Rfl:  .  SYMBICORT 160-4.5 MCG/ACT inhaler, Inhale 2 puffs into the lungs 2 (two) times daily. (Patient taking differently: Inhale 2 puffs into the lungs 2 (two) times daily. ), Disp: 30.6 g, Rfl: 3   Garner Nash, DO [facility] 11/08/2019 3:11 PM

## 2019-11-08 NOTE — Patient Instructions (Signed)
Thank you for visiting Dr. Valeta Harms at [facility]. Today we recommend the following:  We will call you with CT results  Stiolto samples today  New script for stiolto  Stop symbicort  Albuterol as needed  Return in about 6 weeks (around 12/20/2019). Televisit with Wyn Quaker, NP    Please do your part to reduce the spread of COVID-19.

## 2019-11-09 ENCOUNTER — Telehealth: Payer: Self-pay | Admitting: Internal Medicine

## 2019-11-09 ENCOUNTER — Other Ambulatory Visit: Payer: Self-pay | Admitting: Oncology

## 2019-11-09 DIAGNOSIS — I10 Essential (primary) hypertension: Secondary | ICD-10-CM

## 2019-11-09 DIAGNOSIS — J4521 Mild intermittent asthma with (acute) exacerbation: Secondary | ICD-10-CM

## 2019-11-09 MED ORDER — OXYCODONE HCL 5 MG PO TABS: 5.0000 mg | ORAL_TABLET | ORAL | 0 refills | Status: DC | PRN

## 2019-11-09 NOTE — Telephone Encounter (Signed)
New message:   1.Medication Requested: furosemide (LASIX) 20 MG tablet albuterol (PROVENTIL) (2.5 MG/3ML) 0.083% nebulizer solution 2. Pharmacy (Name, Street, [place]): [place], [place] 3. On Med List: yes  4. Last Visit with PCP: 08/31/19  5. Next visit date with PCP:  Pt's wife states that the Lasix was discussed at his previous visit and that the Dr agreed to continue filling this medication. Pt's wife states they were told by the pharmacy that the Dr has refused to fill this medication.  Agent: Please be advised that RX refills may take up to 3 business days. We ask that you follow-up with your pharmacy.

## 2019-11-10 MED ORDER — FUROSEMIDE 20 MG PO TABS: 20.0000 mg | ORAL_TABLET | Freq: Every day | ORAL | 0 refills | Status: DC

## 2019-11-10 MED ORDER — ALBUTEROL SULFATE (2.5 MG/3ML) 0.083% IN NEBU: 2.5000 mg | INHALATION_SOLUTION | Freq: Four times a day (QID) | RESPIRATORY_TRACT | 3 refills | Status: DC | PRN

## 2019-11-14 ENCOUNTER — Other Ambulatory Visit: Payer: Self-pay

## 2019-11-14 ENCOUNTER — Ambulatory Visit (INDEPENDENT_AMBULATORY_CARE_PROVIDER_SITE_OTHER)
Admission: RE | Admit: 2019-11-14 | Discharge: 2019-11-14 | Disposition: A | Discharge status: Home / Self Care | Payer: Medicare Other | Source: Ambulatory Visit | Attending: Pulmonary Disease | Admitting: Pulmonary Disease

## 2019-11-14 DIAGNOSIS — J189 Pneumonia, unspecified organism: Secondary | ICD-10-CM | POA: Diagnosis not present

## 2019-11-14 DIAGNOSIS — R911 Solitary pulmonary nodule: Secondary | ICD-10-CM | POA: Diagnosis not present

## 2019-12-05 ENCOUNTER — Telehealth: Payer: Self-pay | Admitting: Pulmonary Disease

## 2019-12-05 MED ORDER — STIOLTO RESPIMAT 2.5-2.5 MCG/ACT IN AERS: 2.0000 | INHALATION_SPRAY | Freq: Every day | RESPIRATORY_TRACT | 0 refills | Status: DC

## 2019-12-05 NOTE — Telephone Encounter (Signed)
Pt was given 2 samples @ last visit. Pt requesting rx for Stiolto inhaler. Asked patient if he felt Stiolto was working better than Symbicort. Pt says only slight improvement during the day but is having to use rescue inhaler more at night. Pt has televisit with Aaron Edelman on 5/25 for inhaler f/u. 1 inhaler sent to pharmacy to see if pts ins covers and how much cost will be. I told patient I would send to Dr. Valeta Harms as Juluis Rainier.    Plan: Patient needs noncontrasted CT imaging of the chest to complete for review of lung nodule. This was already ordered has yet to be completed. We will call patient with results and come up with plan on next steps based on imaging results. We discussed the risk of being on Symbicort and development of getting car pneumonia with ICS. He does have some allergy history but we did make the decision to switch to [employer]. We also discussed risk of urinary retention related to [place].  He has been using ipratropium as needed with no issues. Recommend stopping use of DuoNeb Stop Symbicort Continue use of albuterol as needed Start Stiolto  We will have a telephone visit to discuss how his symptoms are with new inhaler regimen to be completed in approximately 6 weeks.  I will not be in the office in 6 weeks from now.  We will set up with Wyn Quaker, NP

## 2019-12-06 MED ORDER — STIOLTO RESPIMAT 2.5-2.5 MCG/ACT IN AERS: 2.0000 | INHALATION_SPRAY | Freq: Every day | RESPIRATORY_TRACT | 5 refills | Status: DC

## 2019-12-06 NOTE — Telephone Encounter (Signed)
Please give new prescription for stiolto and if needed additional samples if we have any Garner Nash, DO [facility] 12/06/2019 7:12 AM

## 2019-12-06 NOTE — Telephone Encounter (Signed)
Spoke with pt. He would just like to have the prescription at this time. Rx has been sent in. Nothing further was needed.

## 2019-12-19 ENCOUNTER — Encounter: Payer: Self-pay | Admitting: Pulmonary Disease

## 2019-12-19 ENCOUNTER — Ambulatory Visit (INDEPENDENT_AMBULATORY_CARE_PROVIDER_SITE_OTHER): Payer: Medicare Other | Admitting: Pulmonary Disease

## 2019-12-19 ENCOUNTER — Other Ambulatory Visit: Payer: Self-pay

## 2019-12-19 DIAGNOSIS — R918 Other nonspecific abnormal finding of lung field: Secondary | ICD-10-CM | POA: Diagnosis not present

## 2019-12-19 DIAGNOSIS — Z79899 Other long term (current) drug therapy: Secondary | ICD-10-CM

## 2019-12-19 DIAGNOSIS — J449 Chronic obstructive pulmonary disease, unspecified: Secondary | ICD-10-CM | POA: Diagnosis not present

## 2019-12-19 NOTE — Assessment & Plan Note (Signed)
We will follow up with Dr. Valeta Harms regarding best inhaler options for the patient.  Patient feels that he benefits from being on an ICS.  He is aware of the increased risk factors for developing a pneumonia with inhaled corticosteroids.  He is okay with trialing a low ICS option to see if this helps from a symptom standpoint.

## 2019-12-19 NOTE — Assessment & Plan Note (Signed)
Plan: Repeat CT of chest in July/2021 as ordered

## 2019-12-19 NOTE — Assessment & Plan Note (Signed)
Mixed restriction and obstruction on PFTs in April/2021 Patient reports childhood diagnosis of asthma 25-pack-year smoking history Feels breathing is symptomatically better managed on ICS/LABA inhaler Trial of Stiolto Respimat due to increased risk of pneumonia patient has felt that this has not been a comparable switch and he has had to use his rescue, more often due to increased dyspnea Patient trialed Stiolto for 6 weeks Patient has been on Advair as well as Symbicort in the past  Plan: We will discuss case with Dr. Valeta Harms.  Options at a low-dose ICS could be considered such as Trelegy Ellipta, budesonide nebs with Stiolto Respimat, or Symbicort 80 with Spiriva Respimat 2.5.  Continue Stiolto at this time our office for follow-up with the patient If symptoms worsen patient is to follow-up with our office Follow-up in August/2021 after completing repeat CT of chest

## 2019-12-19 NOTE — Patient Instructions (Signed)
You were seen today by Lauraine Rinne, NP  for:   1. Asthma-COPD overlap syndrome (HCC)  Stiolto Respimat inhaler >>>2 puffs daily >>>Take this no matter what >>>This is not a rescue inhaler   2. Abnormal findings on diagnostic imaging of lung  - CT Chest Wo Contrast; Future  3. Medication management  I will discuss inhaler options with Dr. Valeta Harms   We recommend today:  Orders Placed This Encounter  Procedures  . CT Chest Wo Contrast    Standing Status:   Future    Standing Expiration Date:   12/18/2020    Scheduling Instructions:     Schedule around and after 02/13/20    Order Specific Question:   Preferred imaging location?    Answer:   [facility] [place]    Order Specific Question:   Radiology Contrast Protocol - do NOT remove file path    Answer:   [email or web address]   Orders Placed This Encounter  Procedures  . CT Chest Wo Contrast   No orders of the defined types were placed in this encounter.   Follow Up:    No follow-ups on file.   Please do your part to reduce the spread of COVID-19:      Reduce your risk of any infection  and COVID19 by using the similar precautions used for avoiding the common cold or flu:  [name] Wash your hands often with soap and warm water for at least 20 seconds.  If soap and water are not readily available, use an alcohol-based hand sanitizer with at least 60% alcohol.  . If coughing or sneezing, cover your mouth and nose by coughing or sneezing into the elbow areas of your shirt or coat, into a tissue or into your sleeve (not your hands). Langley Gauss A MASK when in public  . Avoid shaking hands with others and consider head nods or verbal greetings only. . Avoid touching your eyes, nose, or mouth with unwashed hands.  . Avoid close contact with people who are sick. . Avoid places or events with large numbers of people in one location, like concerts or sporting events. . If you have some symptoms but not all  symptoms, continue to monitor at home and seek medical attention if your symptoms worsen. . If you are having a medical emergency, call 911.   [place] / e-Visit: [email or web address]         [facility]: [facility]: [id number]                   [facility]: [id number]     It is flu season:   >>> Best ways to protect herself from the flu: Receive the yearly flu vaccine, practice good hand hygiene washing with soap and also using hand sanitizer when available, eat a nutritious meals, get adequate rest, hydrate appropriately   Please contact the office if your symptoms worsen or you have concerns that you are not improving.   Thank you for choosing [facility] for your healthcare, and for allowing [place] to partner with you on your healthcare journey. I am thankful to be able to provide care to you today.   Wyn Quaker FNP-C

## 2019-12-19 NOTE — Progress Notes (Signed)
Virtual Visit via Telephone Note  I connected with [name] Holder on 12/19/19 at 10:30 AM EDT by telephone and verified that I am speaking with the correct person using two identifiers.  Location: Patient: Home Provider: [facility] - [id number], [place], [place], [place] [address]   I discussed the limitations, risks, security and privacy concerns of performing an evaluation and management service by telephone and the availability of in person appointments. I also discussed with the patient that there may be a patient responsible charge related to this service. The patient expressed understanding and agreed to proceed.  Patient consented to consult via telephone: Yes People present and their role in pt care: Pt     History of Present Illness:  64 year old male former smoker followed in our office for asthma COPD overlap syndrome and abnormal CT  Past medical history: Hypertension, hyperlipidemia, bladder cancer Smoking history: Former smoker.  Quit 2014.  5-pack-year Maintenance: Stiolto Respimat Patient of Dr. Valeta Harms  Chief complaint: 6-week follow-up  64 year old male former smoker followed in our office for asthma.  He was last seen by Dr. Valeta Harms on 11/08/2019.  At that time as a follow-up with a pulmonary function test.  At that point in time it was recommended the patient repeat a CT of his chest to follow a lung nodule.  It was recommended that he stop Symbicort and start Stiolto due to the increased risk of recurrent pneumonias being on an ICS.  Is also recommended he stop DuoNeb's.  Pulmonary function test from this office visit also showed mixed obstructive and restrictive defect.  11/14/2019-CT chest without contrast-platelike opacity is noted in the right middle lobe which is enlarged compared to prior exam while this may simply represent progressive postinfectious scarring or residual inflammation malignancy cannot be excluded, follow-up CT scan in 3 months  is recommended, coronary artery calcifications, aortic arthrosclerosis   Patient reporting today that since being tried on Stiolto Respimat he is really tried to remain adherent to this.  He does feel like he is having increased dyspnea since making the switch.  He feels that he is "smothered".  He has had to use his rescue inhaler 2 dimes daily which is an increase.  He would prefer to switch back to an inhaled corticosteroid formulation but he is open to our suggestions as well as options.  He does not feel that the Stiolto Respimat has been a comparable switch.  We will discuss this today.   Observations/Objective:  Chest Imaging: 08/09/2019 CT chest: Patient with right middle lobe nodular opacity underlying malignancy not excluded.  Likely inflammatory related.  Possible community-acquired pneumonia.  11/14/2019-CT chest without contrast-platelike opacity is noted in the right middle lobe which is enlarged compared to prior exam while this may simply represent progressive postinfectious scarring or residual inflammation malignancy cannot be excluded, follow-up CT scan in 3 months is recommended, coronary artery calcifications, aortic arthrosclerosis   Social History   Tobacco Use  Smoking Status Former Smoker  . Packs/day: 1.00  . Years: 25.00  . Pack years: 25.00  . Types: Cigarettes  . Quit date: 09/08/2012  . Years since quitting: 7.2  Smokeless Tobacco Never Used   Immunization History  Administered Date(s) Administered  . Fluad Quad(high Dose 65+) 04/17/2019  . Influenza Split 04/26/2013, 05/10/2014  . Influenza, High Dose Seasonal PF 04/27/2019  . Influenza,inj,Quad PF,6+ Mos 05/19/2017  . Influenza-Unspecified 04/27/2015, 05/10/2016, 05/06/2018  . Pneumococcal Conjugate-13 08/29/2015  . Pneumococcal Polysaccharide-23 01/06/2013, 04/27/2019  .  Tdap 08/26/2012      Assessment and Plan:  Asthma-COPD overlap syndrome (HCC) Mixed restriction and obstruction on PFTs in  April/2021 Patient reports childhood diagnosis of asthma 25-pack-year smoking history Feels breathing is symptomatically better managed on ICS/LABA inhaler Trial of Stiolto Respimat due to increased risk of pneumonia patient has felt that this has not been a comparable switch and he has had to use his rescue, more often due to increased dyspnea Patient trialed Stiolto for 6 weeks Patient has been on Advair as well as Symbicort in the past  Plan: We will discuss case with Dr. Valeta Harms.  Options at a low-dose ICS could be considered such as Trelegy Ellipta, budesonide nebs with Stiolto Respimat, or Symbicort 80 with Spiriva Respimat 2.5.  Continue Stiolto at this time our office for follow-up with the patient If symptoms worsen patient is to follow-up with our office Follow-up in August/2021 after completing repeat CT of chest  Abnormal findings on diagnostic imaging of lung Plan: Repeat CT of chest in July/2021 as ordered  Medication management We will follow up with Dr. Valeta Harms regarding best inhaler options for the patient.  Patient feels that he benefits from being on an ICS.  He is aware of the increased risk factors for developing a pneumonia with inhaled corticosteroids.  He is okay with trialing a low ICS option to see if this helps from a symptom standpoint.   Follow Up Instructions:  Return in about 3 months (around 03/20/2020), or if symptoms worsen or fail to improve, for Follow up with Dr. Valeta Harms, After Chest CT.   I discussed the assessment and treatment plan with the patient. The patient was provided an opportunity to ask questions and all were answered. The patient agreed with the plan and demonstrated an understanding of the instructions.   The patient was advised to call back or seek an in-person evaluation if the symptoms worsen or if the condition fails to improve as anticipated.  I provided 24 minutes of non-face-to-face time during this encounter.   Lauraine Rinne,  NP

## 2019-12-21 ENCOUNTER — Telehealth: Payer: Self-pay | Admitting: Pulmonary Disease

## 2019-12-21 NOTE — Telephone Encounter (Signed)
12/21/2019  Patient was previously seen by Dr. Valeta Harms and transitioned off of an ICS/LABA inhaler and transition to a LABA/LAMA.  Patient is not tolerating this well.  Pharmacy team,  Can we investigate and see what the coverage options are for the patient for triple therapy inhalers?  I would prefer Trelegy Ellipta 100 since it is a lower dose ICS.  Wyn Quaker, FNP

## 2019-12-22 NOTE — Telephone Encounter (Signed)
Triage,  I would recommend that we contact the patient and we discussed the overall cost of switching inhalers.  Explained to the patient that Trelegy Ellipta 100 has 3 medications in it.  And that he is in the coverage gap which is why the cost is $142.  Unfortunately we do not have any samples of Trelegy Ellipta at this time.  Patient can also be provided printed [place] application to apply for patient assistance if you would like and we can mail this to him.  If this causes too significant then we can see what other options may be available for him  Wyn Quaker, FNP

## 2019-12-22 NOTE — Telephone Encounter (Signed)
Ran test claim, patient's copay for 1 month of Trelegy 100 is $142.28. Patient is in the coverage gap. Patient can apply for [place] patient assistance.  Judithann Sauger is non-formulary.  2:15 PM Beatriz Chancellor, CPhT

## 2019-12-22 NOTE — Telephone Encounter (Signed)
Called and spoke with patient about recs from pharmacy. He would like to do [place] patient assistance paperwork. I have filled out or portion of the application and highlighted the parts for the patient. I have asked him to either mail it back or bring it by the office one he has completed it so that we can get provider signature and fax it in. Patient expressed understanding. Application has been placed in mail to go out. Will wait to get application back from patient.

## 2020-01-01 MED ORDER — TRELEGY ELLIPTA 100-62.5-25 MCG/INH IN AEPB: 1.0000 | INHALATION_SPRAY | Freq: Every day | RESPIRATORY_TRACT | 5 refills | Status: DC

## 2020-01-01 NOTE — Telephone Encounter (Signed)
Pt calling to say that he make to much money to qualify for assistance and would like to just get a prescription called in for the trelegy. Pt would like it called to [facility]. Pt can be reached at [phone].

## 2020-01-01 NOTE — Telephone Encounter (Signed)
Rx for Trelegy sent to preferred pharmacy for pt. Called and spoke with pt letting him know this had been done and he verbalized understanding. Nothing further needed.

## 2020-01-02 NOTE — Progress Notes (Signed)
Thanks for seeing him Garner Nash, DO [facility] 01/02/2020 5:09 PM

## 2020-01-11 ENCOUNTER — Other Ambulatory Visit: Payer: Self-pay | Admitting: Internal Medicine

## 2020-01-26 ENCOUNTER — Telehealth: Payer: Self-pay | Admitting: Oncology

## 2020-01-26 NOTE — Telephone Encounter (Signed)
Rescheduled per providers request, patient has been called and notified regarding rescheduled appointment.

## 2020-01-31 ENCOUNTER — Telehealth: Payer: Self-pay | Admitting: Internal Medicine

## 2020-01-31 NOTE — Progress Notes (Signed)
  Chronic Care Management   Note  01/31/2020 Name: [name] Holder MRN: [id number] DOB: Oct 02, 1955  [name] Holder is a 64 y.o. year old male who is a primary care patient of [name], MD. I reached out to Glendon Axe by phone today in response to a referral sent by [name] Holder's PCP, [name], MD.   [name] Holder was given information about Chronic Care Management services today including:  1. CCM service includes personalized support from designated clinical staff supervised by his physician, including individualized plan of care and coordination with other care providers 2. 24/7 contact phone numbers for assistance for urgent and routine care needs. 3. Service will only be billed when office clinical staff spend 20 minutes or more in a month to coordinate care. 4. Only one practitioner may furnish and bill the service in a calendar month. 5. The patient may stop CCM services at any time (effective at the end of the month) by phone call to the office staff.   Patient agreed to services and verbal consent obtained.  This note is not being shared with the patient for the following reason: To respect privacy (The patient or proxy has requested that the information not be shared).  Follow up plan:   Earney Hamburg Upstream Scheduler

## 2020-02-01 ENCOUNTER — Ambulatory Visit: Payer: Medicare Other | Admitting: Oncology

## 2020-02-01 ENCOUNTER — Other Ambulatory Visit: Payer: Medicare Other

## 2020-02-01 ENCOUNTER — Ambulatory Visit: Payer: Medicare Other

## 2020-02-05 ENCOUNTER — Telehealth: Payer: Self-pay | Admitting: Internal Medicine

## 2020-02-05 NOTE — Telephone Encounter (Signed)
New message:   1.Medication Requested: Flonase spray 2. Pharmacy (Name, Street, [place]): [facility] [place] 3. On Med List: No  4. Last Visit with PCP: 08/31/19  5. Next visit date with PCP: None   Pt is aware that Dr. [name] is not the one who originally prescribed this medication but would like for Dr. [name] to start prescribing this for him.  Agent: Please be advised that RX refills may take up to 3 business days. We ask that you follow-up with your pharmacy.

## 2020-02-06 ENCOUNTER — Other Ambulatory Visit: Payer: Self-pay | Admitting: Internal Medicine

## 2020-02-06 DIAGNOSIS — J301 Allergic rhinitis due to pollen: Secondary | ICD-10-CM

## 2020-02-06 MED ORDER — FLUTICASONE PROPIONATE 50 MCG/ACT NA SUSP: 2.0000 | Freq: Every day | NASAL | 1 refills | Status: DC

## 2020-02-06 NOTE — Telephone Encounter (Signed)
Pt is requesting refill of flonase and would like for you to take over the prescription if you agree. Please advise.

## 2020-02-09 ENCOUNTER — Other Ambulatory Visit: Payer: Self-pay | Admitting: Oncology

## 2020-02-09 DIAGNOSIS — M4850XA Collapsed vertebra, not elsewhere classified, site unspecified, initial encounter for fracture: Secondary | ICD-10-CM

## 2020-02-09 DIAGNOSIS — Z8546 Personal history of malignant neoplasm of prostate: Secondary | ICD-10-CM

## 2020-02-12 ENCOUNTER — Inpatient Hospital Stay: Payer: Medicare Other | Attending: Oncology

## 2020-02-12 ENCOUNTER — Inpatient Hospital Stay: Payer: Medicare Other | Admitting: Oncology

## 2020-02-12 ENCOUNTER — Inpatient Hospital Stay: Payer: Medicare Other

## 2020-02-12 ENCOUNTER — Other Ambulatory Visit: Payer: Self-pay

## 2020-02-12 VITALS — BP 141/79 | HR 79 | Temp 97.5°F | Resp 18 | Ht 68.5 in | Wt 218.6 lb

## 2020-02-12 DIAGNOSIS — M4850XA Collapsed vertebra, not elsewhere classified, site unspecified, initial encounter for fracture: Secondary | ICD-10-CM | POA: Diagnosis not present

## 2020-02-12 DIAGNOSIS — Z5111 Encounter for antineoplastic chemotherapy: Secondary | ICD-10-CM | POA: Diagnosis not present

## 2020-02-12 DIAGNOSIS — C61 Malignant neoplasm of prostate: Secondary | ICD-10-CM | POA: Insufficient documentation

## 2020-02-12 DIAGNOSIS — Z8546 Personal history of malignant neoplasm of prostate: Secondary | ICD-10-CM

## 2020-02-12 DIAGNOSIS — C775 Secondary and unspecified malignant neoplasm of intrapelvic lymph nodes: Secondary | ICD-10-CM | POA: Insufficient documentation

## 2020-02-12 DIAGNOSIS — C7951 Secondary malignant neoplasm of bone: Secondary | ICD-10-CM

## 2020-02-12 LAB — CBC WITH DIFFERENTIAL (CANCER CENTER ONLY)
Abs Immature Granulocytes: 0.05 10*3/uL (ref 0.00–0.07)
Basophils Absolute: 0.1 10*3/uL (ref 0.0–0.1)
Basophils Relative: 1 %
Eosinophils Absolute: 0.1 10*3/uL (ref 0.0–0.5)
Eosinophils Relative: 1 %
HCT: 36.9 % — ABNORMAL LOW (ref 39.0–52.0)
Hemoglobin: 12.4 g/dL — ABNORMAL LOW (ref 13.0–17.0)
Immature Granulocytes: 1 %
Lymphocytes Relative: 24 %
Lymphs Abs: 2.3 10*3/uL (ref 0.7–4.0)
MCH: 30.8 pg (ref 26.0–34.0)
MCHC: 33.6 g/dL (ref 30.0–36.0)
MCV: 91.8 fL (ref 80.0–100.0)
Monocytes Absolute: 0.9 10*3/uL (ref 0.1–1.0)
Monocytes Relative: 10 %
Neutro Abs: 6 10*3/uL (ref 1.7–7.7)
Neutrophils Relative %: 63 %
Platelet Count: 276 10*3/uL (ref 150–400)
RBC: 4.02 MIL/uL — ABNORMAL LOW (ref 4.22–5.81)
RDW: 12.8 % (ref 11.5–15.5)
WBC Count: 9.4 10*3/uL (ref 4.0–10.5)
nRBC: 0 % (ref 0.0–0.2)

## 2020-02-12 LAB — CMP (CANCER CENTER ONLY)
ALT: 12 U/L (ref 0–44)
AST: 13 U/L — ABNORMAL LOW (ref 15–41)
Albumin: 3.8 g/dL (ref 3.5–5.0)
Alkaline Phosphatase: 56 U/L (ref 38–126)
Anion gap: 10 (ref 5–15)
BUN: 18 mg/dL (ref 8–23)
CO2: 24 mmol/L (ref 22–32)
Calcium: 10 mg/dL (ref 8.9–10.3)
Chloride: 104 mmol/L (ref 98–111)
Creatinine: 0.86 mg/dL (ref 0.61–1.24)
GFR, Est AFR Am: >60 mL/min (ref >60)
GFR, Estimated: >60 mL/min (ref >60)
Glucose, Bld: 111 mg/dL — ABNORMAL HIGH (ref 70–99)
Potassium: 4.5 mmol/L (ref 3.5–5.1)
Sodium: 138 mmol/L (ref 135–145)
Total Bilirubin: 0.6 mg/dL (ref 0.3–1.2)
Total Protein: 6.8 g/dL (ref 6.5–8.1)

## 2020-02-12 MED ORDER — OXYCODONE HCL 5 MG PO TABS: 5.0000 mg | ORAL_TABLET | ORAL | 0 refills | Status: DC | PRN

## 2020-02-12 MED ORDER — LEUPROLIDE ACETATE (3 MONTH) 22.5 MG ~~LOC~~ KIT
PACK | SUBCUTANEOUS | Status: AC
  Filled 2020-02-12: qty 22.5

## 2020-02-12 MED ORDER — LEUPROLIDE ACETATE (3 MONTH) 22.5 MG ~~LOC~~ KIT
22.5000 mg | PACK | Freq: Once | SUBCUTANEOUS | Status: AC
  Administered 2020-02-12: 22.5 mg via SUBCUTANEOUS

## 2020-02-12 NOTE — Patient Instructions (Signed)

## 2020-02-12 NOTE — Progress Notes (Signed)
Hematology and Oncology Follow Up Visit  [name] Holder [id number] 1955/12/11 64 y.o. 02/12/2020 9:32 AM [name] Holder [name] Holder, MDJones, [name] Right, MD   Principle Diagnosis: 64 year old man with castration-resistant prostate cancer diagnosed in 2018.  He presented with advanced disease in 2014 with lymph node involvement.  Prior Therapy:  Androgen deprivation therapy in 2014.  He developed castration resistant in 2018.  He is status post T10, T12, kyphoplasty completed on January 22 of 2020.  Current therapy:  Zytiga 1000 mg daily with prednisone at 5 mg daily started in 2018.  Eligard every 3 months.  Next injection scheduled for 02/12/2020.  Interim History: [name] Holder returns today for a follow-up evaluation.  Since the last visit, he reports no major changes in his health.  He was hospitalized in January 2021 for hyponatremia.  He has tolerated Zytiga without any major complaints at this time.  He denies any nausea, excessive fatigue or bone pain.  He denies any pathological fractures or falls.  He does ambulate inside his house without any issues and the use of a walker or cane.  Is a chronic back pain has been manageable with oxycodone.           Medications: Reviewed without changes. Current Outpatient Medications  Medication Sig Dispense Refill  . abiraterone acetate (ZYTIGA) 250 MG tablet Take 1,000 mg by mouth daily.   10  . acetaminophen (TYLENOL) 325 MG tablet Take 325 mg by mouth daily as needed for fever.    [name] albuterol (PROAIR HFA) 108 (90 Base) MCG/ACT inhaler 2 PUFFS EVERY 6 HOURS AS NEEDED FOR WHEEZING (Patient taking differently: Inhale 2 puffs into the lungs every 6 (six) hours as needed for wheezing. 2 PUFFS EVERY 6 HOURS AS NEEDED FOR WHEEZING) 8.5 g 5  . albuterol (PROVENTIL) (2.5 MG/3ML) 0.083% nebulizer solution Take 3 mLs (2.5 mg total) by nebulization every 6 (six) hours as needed for wheezing or shortness of breath. NEBULIZE 1 VIAL EVERY 6-8 HOURS AS NEEDED  FOR WHEEZING OR SHORTNESS OF BREATH 360 mL 3  . Azelastine HCl 137 MCG/SPRAY SOLN USE 1 TO 2 SPRAYS IN EACH NOSTRIL EVERY 12 HOURS 30 mL 1  . b complex vitamins tablet Take 1 tablet by mouth daily.    [name] CALCIUM-VITAMIN D PO Take 1 tablet by mouth 2 (two) times daily.     . carvedilol (COREG) 3.125 MG tablet Take 1 tablet (3.125 mg total) by mouth 2 (two) times daily with a meal. 90 tablet 1  . fluticasone (FLONASE) 50 MCG/ACT nasal spray Place 2 sprays into both nostrils daily. 48 g 1  . Fluticasone-Umeclidin-Vilant (TRELEGY ELLIPTA) 100-62.5-25 MCG/INH AEPB Inhale 1 puff into the lungs daily. 60 each 5  . furosemide (LASIX) 20 MG tablet Take 1 tablet (20 mg total) by mouth daily. 90 tablet 0  . ipratropium-albuterol (DUONEB) 0.5-2.5 (3) MG/3ML SOLN Take 3 mLs by nebulization every 4 (four) hours as needed (wheezing/SOB). 360 mL 1  . Leuprolide Acetate (LUPRON IJ) Inject 1 Dose as directed every 4 (four) months. This is administered by [facility]    . Multiple Vitamins-Minerals (CENTRUM SILVER 50+MEN PO) Take 1 tablet by mouth daily.    [name] NARCAN 4 MG/0.1ML LIQD nasal spray kit Place 0.4 mg into the nose once as needed (overdose).     [name] olmesartan (BENICAR) 40 MG tablet TAKE 1 TABLET DAILY 90 tablet 0  . oxyCODONE (OXY IR/ROXICODONE) 5 MG immediate release tablet Take 1 tablet (5 mg total) by mouth  every 4 (four) hours as needed for severe pain. 180 tablet 0  . predniSONE (DELTASONE) 5 MG tablet Take 1 tablet (5 mg total) by mouth daily with breakfast. (Patient taking differently: Take 5 mg by mouth 2 (two) times daily with a meal. )    . sodium chloride 1 g tablet Take 2 g by mouth 2 (two) times daily with a meal.    . solifenacin (VESICARE) 5 MG tablet Take 5 mg by mouth daily.    . SYMBICORT 160-4.5 MCG/ACT inhaler Inhale into the lungs.     No current facility-administered medications for this visit.     Allergies:  Allergies  Allergen Reactions  . Amlodipine Swelling    edema  .  Tetracycline Rash      Physical Exam:  Blood pressure (!) 141/79, pulse 79, temperature (!) 97.5 F (36.4 C), temperature source Temporal, resp. rate 18, height 5' 8.5" (1.74 m), weight 218 lb 9.6 oz (99.2 kg), SpO2 100 %.    ECOG: 1      General appearance: Comfortable appearing without any discomfort Head: Normocephalic without any trauma Oropharynx: Mucous membranes are moist and pink without any thrush or ulcers. Eyes: Pupils are equal and round reactive to light. Lymph nodes: No cervical, supraclavicular, inguinal or axillary lymphadenopathy.   Heart:regular rate and rhythm.  S1 and S2 without leg edema. Lung: Clear without any rhonchi or wheezes.  No dullness to percussion. Abdomin: Soft, nontender, nondistended with good bowel sounds.  No hepatosplenomegaly. Musculoskeletal: No joint deformity or effusion.  Full range of motion noted. Neurological: No deficits noted on motor, sensory and deep tendon reflex exam. Skin: No petechial rash or dryness.  Appeared moist.           Lab Results: Lab Results  Component Value Date   WBC 9.4 02/12/2020   HGB 12.4 (L) 02/12/2020   HCT 36.9 (L) 02/12/2020   MCV 91.8 02/12/2020   PLT 276 02/12/2020     Chemistry      Component Value Date/Time   NA 134 (L) 09/21/2019 1451   K 4.3 09/21/2019 1451   CL 98 09/21/2019 1451   CO2 27 09/21/2019 1451   BUN 13 09/21/2019 1451   CREATININE 0.84 09/21/2019 1451   CREATININE 0.81 04/14/2019 0855      Component Value Date/Time   CALCIUM 9.9 09/21/2019 1451   ALKPHOS 56 04/14/2019 0855   AST 15 04/14/2019 0855   ALT 17 04/14/2019 0855   BILITOT 0.4 04/14/2019 0855     Results for [name] Holder (MRN [id number]) as of 02/12/2020 09:20  Ref. Range 04/14/2019 08:55 07/20/2019 00:00  PSA Unknown  0.3  Prostate Specific Ag, Serum Latest Ref Range: 0.0 - 4.0 ng/mL 0.2       Impression and Plan:  64 year old man with:  1.    Advanced prostate cancer diagnosed in  2014.  He had developed castration-resistant with pelvic adenopathy in 2018.   The natural course of his disease was reviewed today and treatment options were reiterated.  He is currently on Zytiga which she has tolerated very well with PSA under reasonable control.  Imaging studies in the last year did not show any evidence of disease progression.  He understands that at some point will require different salvage therapy which includes systemic chemotherapy among other choices.  For the time I recommended continuing Zytiga.     2.  Compression fracture tween T10 and T12: No bone metastasis noted at this time.  We  will continue to monitor moving forward his pain has improved.   3.  Bone directed therapy: I recommended calcium and vitamin D supplements.  Bone directed therapy has been deferred till he obtains dental clearance.  4.  Pain: Manageable with oxycodone.  His pain is related to compression fractures from osteoporosis.  5.  Prognosis and goals of care: his disease is incurable although aggressive measures are warranted at this time.   6.  Covid vaccination considerations: He is up-to-date his vaccination series.   7.  Follow-up: In 3 months for repeat follow-up.  30  minutes were dedicated to this visit. The time was spent on reviewing laboratory data, discussing treatment options, and answering questions regarding future plan.      Zola Button, MD 7/19/20219:32 AM

## 2020-02-13 ENCOUNTER — Telehealth: Payer: Self-pay

## 2020-02-13 LAB — PROSTATE-SPECIFIC AG, SERUM (LABCORP): Prostate Specific Ag, Serum: 0.3 ng/mL (ref 0.0–4.0)

## 2020-02-13 NOTE — Telephone Encounter (Signed)
Called patient and made him aware of PSA result. He verbalized understanding.  °

## 2020-02-13 NOTE — Telephone Encounter (Signed)
-----   Message from Wyatt Portela, MD sent at 02/13/2020  8:38 AM EDT ----- Please let him know his PSA is still low

## 2020-02-16 ENCOUNTER — Other Ambulatory Visit: Payer: Self-pay

## 2020-02-16 ENCOUNTER — Ambulatory Visit (HOSPITAL_COMMUNITY)
Admission: RE | Admit: 2020-02-16 | Discharge: 2020-02-16 | Disposition: A | Discharge status: Home / Self Care | Payer: Medicare Other | Source: Ambulatory Visit | Attending: Pulmonary Disease | Admitting: Pulmonary Disease

## 2020-02-16 DIAGNOSIS — S2242XD Multiple fractures of ribs, left side, subsequent encounter for fracture with routine healing: Secondary | ICD-10-CM | POA: Diagnosis not present

## 2020-02-16 DIAGNOSIS — I700 Atherosclerosis of aorta: Secondary | ICD-10-CM | POA: Diagnosis not present

## 2020-02-16 DIAGNOSIS — R918 Other nonspecific abnormal finding of lung field: Secondary | ICD-10-CM | POA: Diagnosis not present

## 2020-02-16 DIAGNOSIS — J9811 Atelectasis: Secondary | ICD-10-CM | POA: Diagnosis not present

## 2020-02-16 DIAGNOSIS — I2510 Atherosclerotic heart disease of native coronary artery without angina pectoris: Secondary | ICD-10-CM | POA: Diagnosis not present

## 2020-02-21 NOTE — Progress Notes (Signed)
Spoke with pt and notified of results per Aaron Edelman. Pt verbalized understanding and denied any questions. Dr Juline Patch schedule not out 8-12 wks so placed reminder.

## 2020-02-22 ENCOUNTER — Telehealth: Payer: Self-pay | Admitting: Oncology

## 2020-02-22 NOTE — Telephone Encounter (Signed)
Scheduled per 07/19 los, patient has been called and notified. 

## 2020-03-06 ENCOUNTER — Telehealth: Payer: Self-pay

## 2020-03-06 DIAGNOSIS — I10 Essential (primary) hypertension: Secondary | ICD-10-CM

## 2020-03-06 NOTE — Telephone Encounter (Signed)
New message  The patient voiced he was confused regarding the direction of medication carvedilol (COREG) 3.125 MG tablet end up using all the medication currently is out of medication.   The [facility] faxed a request for Rx the patient will pay cash verse filling with his insurance   [place], [place]

## 2020-03-07 MED ORDER — CARVEDILOL 3.125 MG PO TABS: 3.1250 mg | ORAL_TABLET | Freq: Two times a day (BID) | ORAL | 0 refills | Status: DC

## 2020-03-07 NOTE — Telephone Encounter (Signed)
Spoke to pt.   Reviewed chart and there was an OV on 08/04/2019 with Dr. Quay Burow changed the carvedilol to 3.125 2 tabs bid to total 6.25 mg since the patient already had the 3.125mg  at home. Dr. Quay Burow updated the medication list. Patient followed up and a diuretic was added.   Med was continued at the 3.125 mg 2 tabs bid until phone note on 11/03/2019. Dr. Gery Pray to go back to the 3.125 1 tab bid.   I assumed that the pharmacy would communicate this with the patient. I did not contact the patient myself to inform of the dose change. I apologized to the patient and informed that I would call the pharmacy and okay the early fill due to the miscommunication on my part. Erx has been sent. Pt is to follow up within 3 months or sooner if needed.

## 2020-03-12 ENCOUNTER — Telehealth: Payer: Self-pay

## 2020-03-12 NOTE — Telephone Encounter (Signed)
PROGRESS NOTE: Faxed signed form from Dr Alen Blew to [facility] pharmacy/ homecare at Fax# [phone]. Fax came back as approved and placed in bin at nurse desk

## 2020-03-23 ENCOUNTER — Other Ambulatory Visit: Payer: Self-pay | Admitting: Internal Medicine

## 2020-03-23 DIAGNOSIS — J4521 Mild intermittent asthma with (acute) exacerbation: Secondary | ICD-10-CM

## 2020-03-25 ENCOUNTER — Other Ambulatory Visit: Payer: Self-pay | Admitting: Internal Medicine

## 2020-03-25 DIAGNOSIS — I10 Essential (primary) hypertension: Secondary | ICD-10-CM

## 2020-03-31 DIAGNOSIS — H10021 Other mucopurulent conjunctivitis, right eye: Secondary | ICD-10-CM | POA: Diagnosis not present

## 2020-03-31 DIAGNOSIS — H00012 Hordeolum externum right lower eyelid: Secondary | ICD-10-CM | POA: Diagnosis not present

## 2020-03-31 DIAGNOSIS — R0981 Nasal congestion: Secondary | ICD-10-CM | POA: Diagnosis not present

## 2020-03-31 DIAGNOSIS — J029 Acute pharyngitis, unspecified: Secondary | ICD-10-CM | POA: Diagnosis not present

## 2020-04-02 ENCOUNTER — Other Ambulatory Visit: Payer: Self-pay | Admitting: Internal Medicine

## 2020-04-02 ENCOUNTER — Other Ambulatory Visit: Payer: Self-pay

## 2020-04-02 ENCOUNTER — Telehealth: Payer: Self-pay

## 2020-04-02 DIAGNOSIS — J4520 Mild intermittent asthma, uncomplicated: Secondary | ICD-10-CM

## 2020-04-02 MED ORDER — PREDNISONE 5 MG PO TABS: 5.0000 mg | ORAL_TABLET | Freq: Two times a day (BID) | ORAL | 0 refills | Status: DC

## 2020-04-02 NOTE — Telephone Encounter (Signed)
-----   Message from Wyatt Portela, MD sent at 04/02/2020  2:03 PM EDT ----- Please send a refill. Thanks ----- Message ----- From: Kennedy Bucker, LPN Sent: 4/0/3709   2:01 PM EDT To: Wyatt Portela, MD  Patient called requesting a refill on his prednisone 5mg  BID. Thank you.   Maudie Mercury

## 2020-04-02 NOTE — Progress Notes (Signed)
error 

## 2020-04-02 NOTE — Telephone Encounter (Signed)
Refill medication per Dr Alen Blew. Called pharmacy to confirm prescription. Called patient to notified him it had been called in. Patient verbalized understanding.

## 2020-04-08 DIAGNOSIS — H00025 Hordeolum internum left lower eyelid: Secondary | ICD-10-CM | POA: Diagnosis not present

## 2020-04-09 ENCOUNTER — Other Ambulatory Visit: Payer: Self-pay | Admitting: Oncology

## 2020-04-09 NOTE — Telephone Encounter (Signed)
Dr. Shadad, For your approval or refusal. Jazzie Trampe M Charli Halle, RN  

## 2020-04-16 ENCOUNTER — Other Ambulatory Visit: Payer: Self-pay | Admitting: Internal Medicine

## 2020-04-23 ENCOUNTER — Ambulatory Visit: Payer: Medicare Other

## 2020-04-24 ENCOUNTER — Other Ambulatory Visit: Payer: Self-pay | Admitting: Internal Medicine

## 2020-04-24 ENCOUNTER — Other Ambulatory Visit: Payer: Self-pay | Admitting: Oncology

## 2020-04-24 DIAGNOSIS — J4521 Mild intermittent asthma with (acute) exacerbation: Secondary | ICD-10-CM

## 2020-04-24 MED ORDER — ALBUTEROL SULFATE (2.5 MG/3ML) 0.083% IN NEBU: INHALATION_SOLUTION | RESPIRATORY_TRACT | 3 refills | Status: DC

## 2020-05-06 ENCOUNTER — Ambulatory Visit: Payer: Medicare Other

## 2020-05-09 ENCOUNTER — Ambulatory Visit: Payer: Medicare Other | Admitting: Pulmonary Disease

## 2020-05-10 ENCOUNTER — Other Ambulatory Visit: Payer: Self-pay | Admitting: Oncology

## 2020-05-10 MED ORDER — OXYCODONE HCL 5 MG PO TABS: 5.0000 mg | ORAL_TABLET | ORAL | 0 refills | Status: DC | PRN

## 2020-05-13 ENCOUNTER — Other Ambulatory Visit: Payer: Self-pay | Admitting: Oncology

## 2020-05-14 ENCOUNTER — Inpatient Hospital Stay: Payer: Medicare Other | Attending: Oncology

## 2020-05-14 ENCOUNTER — Inpatient Hospital Stay: Payer: Medicare Other

## 2020-05-14 ENCOUNTER — Inpatient Hospital Stay (HOSPITAL_BASED_OUTPATIENT_CLINIC_OR_DEPARTMENT_OTHER): Payer: Medicare Other | Admitting: Oncology

## 2020-05-14 ENCOUNTER — Other Ambulatory Visit: Payer: Self-pay

## 2020-05-14 VITALS — BP 139/66 | HR 81 | Temp 96.8°F | Resp 18 | Ht 68.5 in | Wt 228.3 lb

## 2020-05-14 DIAGNOSIS — C775 Secondary and unspecified malignant neoplasm of intrapelvic lymph nodes: Secondary | ICD-10-CM | POA: Diagnosis not present

## 2020-05-14 DIAGNOSIS — Z5111 Encounter for antineoplastic chemotherapy: Secondary | ICD-10-CM | POA: Insufficient documentation

## 2020-05-14 DIAGNOSIS — C7951 Secondary malignant neoplasm of bone: Secondary | ICD-10-CM

## 2020-05-14 DIAGNOSIS — C61 Malignant neoplasm of prostate: Secondary | ICD-10-CM | POA: Insufficient documentation

## 2020-05-14 DIAGNOSIS — C7952 Secondary malignant neoplasm of bone marrow: Secondary | ICD-10-CM | POA: Diagnosis not present

## 2020-05-14 DIAGNOSIS — Z8546 Personal history of malignant neoplasm of prostate: Secondary | ICD-10-CM

## 2020-05-14 LAB — CMP (CANCER CENTER ONLY)
ALT: 13 U/L (ref 0–44)
AST: 10 U/L — ABNORMAL LOW (ref 15–41)
Albumin: 3.5 g/dL (ref 3.5–5.0)
Alkaline Phosphatase: 55 U/L (ref 38–126)
Anion gap: 6 (ref 5–15)
BUN: 22 mg/dL (ref 8–23)
CO2: 28 mmol/L (ref 22–32)
Calcium: 9.5 mg/dL (ref 8.9–10.3)
Chloride: 105 mmol/L (ref 98–111)
Creatinine: 0.79 mg/dL (ref 0.61–1.24)
GFR, Estimated: >60 mL/min (ref >60)
Glucose, Bld: 114 mg/dL — ABNORMAL HIGH (ref 70–99)
Potassium: 4.6 mmol/L (ref 3.5–5.1)
Sodium: 139 mmol/L (ref 135–145)
Total Bilirubin: 0.3 mg/dL (ref 0.3–1.2)
Total Protein: 6.4 g/dL — ABNORMAL LOW (ref 6.5–8.1)

## 2020-05-14 LAB — CBC WITH DIFFERENTIAL (CANCER CENTER ONLY)
Abs Immature Granulocytes: 0.05 10*3/uL (ref 0.00–0.07)
Basophils Absolute: 0.1 10*3/uL (ref 0.0–0.1)
Basophils Relative: 1 %
Eosinophils Absolute: 0.1 10*3/uL (ref 0.0–0.5)
Eosinophils Relative: 1 %
HCT: 35.6 % — ABNORMAL LOW (ref 39.0–52.0)
Hemoglobin: 12.1 g/dL — ABNORMAL LOW (ref 13.0–17.0)
Immature Granulocytes: 1 %
Lymphocytes Relative: 22 %
Lymphs Abs: 2.1 10*3/uL (ref 0.7–4.0)
MCH: 31.2 pg (ref 26.0–34.0)
MCHC: 34 g/dL (ref 30.0–36.0)
MCV: 91.8 fL (ref 80.0–100.0)
Monocytes Absolute: 0.9 10*3/uL (ref 0.1–1.0)
Monocytes Relative: 9 %
Neutro Abs: 6.2 10*3/uL (ref 1.7–7.7)
Neutrophils Relative %: 66 %
Platelet Count: 265 10*3/uL (ref 150–400)
RBC: 3.88 MIL/uL — ABNORMAL LOW (ref 4.22–5.81)
RDW: 13.7 % (ref 11.5–15.5)
WBC Count: 9.3 10*3/uL (ref 4.0–10.5)
nRBC: 0 % (ref 0.0–0.2)

## 2020-05-14 MED ORDER — LEUPROLIDE ACETATE (3 MONTH) 22.5 MG ~~LOC~~ KIT
22.5000 mg | PACK | Freq: Once | SUBCUTANEOUS | Status: AC
  Administered 2020-05-14: 22.5 mg via SUBCUTANEOUS

## 2020-05-14 MED ORDER — LEUPROLIDE ACETATE (3 MONTH) 22.5 MG ~~LOC~~ KIT
PACK | SUBCUTANEOUS | Status: AC
  Filled 2020-05-14: qty 22.5

## 2020-05-14 NOTE — Patient Instructions (Signed)
Leuprolide injection What is this medicine? LEUPROLIDE (loo PROE lide) is a man-made hormone. It is used to treat the symptoms of prostate cancer. This medicine may also be used to treat children with early onset of puberty. It may be used for other hormonal conditions. This medicine may be used for other purposes; ask your health care provider or pharmacist if you have questions. COMMON BRAND NAME(S): Lupron What should I tell my health care provider before I take this medicine? They need to know if you have any of these conditions:  diabetes  heart disease or previous heart attack  high blood pressure  high cholesterol  pain or difficulty passing urine  spinal cord metastasis  stroke  tobacco smoker  an unusual or allergic reaction to leuprolide, benzyl alcohol, other medicines, foods, dyes, or preservatives  pregnant or trying to get pregnant  breast-feeding How should I use this medicine? This medicine is for injection under the skin or into a muscle. You will be taught how to prepare and give this medicine. Use exactly as directed. Take your medicine at regular intervals. Do not take your medicine more often than directed. It is important that you put your used needles and syringes in a special sharps container. Do not put them in a trash can. If you do not have a sharps container, call your pharmacist or healthcare provider to get one. A special MedGuide will be given to you by the pharmacist with each prescription and refill. Be sure to read this information carefully each time. Talk to your pediatrician regarding the use of this medicine in children. While this medicine may be prescribed for children as young as 8 years for selected conditions, precautions do apply. Overdosage: If you think you have taken too much of this medicine contact a poison control center or emergency room at once. NOTE: This medicine is only for you. Do not share this medicine with others. What if  I miss a dose? If you miss a dose, take it as soon as you can. If it is almost time for your next dose, take only that dose. Do not take double or extra doses. What may interact with this medicine? Do not take this medicine with any of the following medications:  chasteberry This medicine may also interact with the following medications:  herbal or dietary supplements, like black cohosh or DHEA  male hormones, like estrogens or progestins and birth control pills, patches, rings, or injections  male hormones, like testosterone This list may not describe all possible interactions. Give your health care provider a list of all the medicines, herbs, non-prescription drugs, or dietary supplements you use. Also tell them if you smoke, drink alcohol, or use illegal drugs. Some items may interact with your medicine. What should I watch for while using this medicine? Visit your doctor or health care professional for regular checks on your progress. During the first week, your symptoms may get worse, but then will improve as you continue your treatment. You may get hot flashes, increased bone pain, increased difficulty passing urine, or an aggravation of nerve symptoms. Discuss these effects with your doctor or health care professional, some of them may improve with continued use of this medicine. Male patients may experience a menstrual cycle or spotting during the first 2 months of therapy with this medicine. If this continues, contact your doctor or health care professional. This medicine may increase blood sugar. Ask your healthcare provider if changes in diet or medicines are needed if   you have diabetes. What side effects may I notice from receiving this medicine? Side effects that you should report to your doctor or health care professional as soon as possible:  allergic reactions like skin rash, itching or hives, swelling of the face, lips, or tongue  breathing problems  chest  pain  depression or memory disorders  pain in your legs or groin  pain at site where injected  severe headache  signs and symptoms of high blood sugar such as being more thirsty or hungry or having to urinate more than normal. You may also feel very tired or have blurry vision  swelling of the feet and legs  visual changes  vomiting Side effects that usually do not require medical attention (report to your doctor or health care professional if they continue or are bothersome):  breast swelling or tenderness  decrease in sex drive or performance  diarrhea  hot flashes  loss of appetite  muscle, joint, or bone pains  nausea  redness or irritation at site where injected  skin problems or acne This list may not describe all possible side effects. Call your doctor for medical advice about side effects. You may report side effects to FDA at 1-800-FDA-1088. Where should I keep my medicine? Keep out of the reach of children. Store below 25 degrees C (77 degrees F). Do not freeze. Protect from light. Do not use if it is not clear or if there are particles present. Throw away any unused medicine after the expiration date. NOTE: This sheet is a summary. It may not cover all possible information. If you have questions about this medicine, talk to your doctor, pharmacist, or health care provider.  2020 Elsevier/Gold Standard (2018-05-12 09:52:48)  

## 2020-05-14 NOTE — Progress Notes (Signed)
Hematology and Oncology Follow Up Visit  [name] Holder [id number] 28-Mar-1956 64 y.o. 05/14/2020 10:10 AM [name] Holder, MDJones, [name] Right, MD   Principle Diagnosis: 64 year old man with prostate cancer diagnosed in 2014.  He developed castration-resistant disease with lymphadenopathy.  Prior Therapy:  Androgen deprivation therapy in 2014.  He developed castration resistant in 2018.  He is status post T10, T12, kyphoplasty completed on January 22 of 2020.  Current therapy:  Zytiga 1000 mg daily with prednisone at 5 mg daily started in 2018.  Eligard every 3 months.  He received Eligard today.  Interim History: [name] Holder presents today for a follow-up visit.  Since the last visit, he reports no major changes in his health.  He denies any recent complications related to [name].  He does have chronic back pain which is manageable with oxycodone without any need for long acting pain medication.  He denies any recent hospitalization or illnesses.          Medications: Updated on review. Current Outpatient Medications  Medication Sig Dispense Refill  . abiraterone acetate (ZYTIGA) 250 MG tablet Take 1,000 mg by mouth daily.   10  . acetaminophen (TYLENOL) 325 MG tablet Take 325 mg by mouth daily as needed for fever.    [name] albuterol (PROVENTIL) (2.5 MG/3ML) 0.083% nebulizer solution NEBULIZE 1 VIAL EVERY 6-8 HOURS AS NEEDED FOR WHEEZING OR SHORTNESS OF BREATH 360 mL 3  . albuterol (VENTOLIN HFA) 108 (90 Base) MCG/ACT inhaler Inhale 2 puffs into the lungs every 6 (six) hours as needed for wheezing. 2 PUFFS EVERY 6 HOURS AS NEEDED FOR WHEEZING 8.5 g 5  . Azelastine HCl 137 MCG/SPRAY SOLN USE 1 TO 2 SPRAYS IN EACH NOSTRIL EVERY 12 HOURS 30 mL 1  . b complex vitamins tablet Take 1 tablet by mouth daily.    [name] CALCIUM-VITAMIN D PO Take 1 tablet by mouth 2 (two) times daily.     . carvedilol (COREG) 3.125 MG tablet Take 1 tablet (3.125 mg total) by mouth 2 (two) times daily with a  meal. 180 tablet 0  . fluticasone (FLONASE) 50 MCG/ACT nasal spray Place 2 sprays into both nostrils daily. 48 g 1  . Fluticasone-Umeclidin-Vilant (TRELEGY ELLIPTA) 100-62.5-25 MCG/INH AEPB Inhale 1 puff into the lungs daily. 60 each 5  . furosemide (LASIX) 20 MG tablet TAKE 1 TABLET DAILY 90 tablet 0  . Leuprolide Acetate (LUPRON IJ) Inject 1 Dose as directed every 4 (four) months. This is administered by [facility]    . Multiple Vitamins-Minerals (CENTRUM SILVER 50+MEN PO) Take 1 tablet by mouth daily.    [name] NARCAN 4 MG/0.1ML LIQD nasal spray kit Place 0.4 mg into the nose once as needed (overdose).     [name] olmesartan (BENICAR) 40 MG tablet TAKE 1 TABLET DAILY 90 tablet 0  . oxyCODONE (OXY IR/ROXICODONE) 5 MG immediate release tablet TAKE 1 TABLET EVERY 4 HOURS AS NEEDED FOR SEVERE PAIN 180 tablet 0  . predniSONE (DELTASONE) 5 MG tablet TAKE  (1)  TABLET TWICE A DAY. 60 tablet 0  . sodium chloride 1 g tablet Take 2 g by mouth 2 (two) times daily with a meal.    . solifenacin (VESICARE) 5 MG tablet Take 5 mg by mouth daily.    . SYMBICORT 160-4.5 MCG/ACT inhaler Inhale into the lungs.     No current facility-administered medications for this visit.     Allergies:  Allergies  Allergen Reactions  . Amlodipine Swelling    edema  .  Tetracycline Rash      Physical Exam:     ECOG: 1     General appearance: Alert, awake without any distress. Head: Atraumatic without abnormalities Oropharynx: Without any thrush or ulcers. Eyes: No scleral icterus. Lymph nodes: No lymphadenopathy noted in the cervical, supraclavicular, or axillary nodes Heart:regular rate and rhythm, without any murmurs or gallops.   Lung: Clear to auscultation without any rhonchi, wheezes or dullness to percussion. Abdomin: Soft, nontender without any shifting dullness or ascites. Musculoskeletal: No clubbing or cyanosis. Neurological: No motor or sensory deficits. Skin: No rashes or  lesions.          Lab Results: Lab Results  Component Value Date   WBC 9.3 05/14/2020   HGB 12.1 (L) 05/14/2020   HCT 35.6 (L) 05/14/2020   MCV 91.8 05/14/2020   PLT 265 05/14/2020     Chemistry      Component Value Date/Time   NA 138 02/12/2020 0858   K 4.5 02/12/2020 0858   CL 104 02/12/2020 0858   CO2 24 02/12/2020 0858   BUN 18 02/12/2020 0858   CREATININE 0.86 02/12/2020 0858      Component Value Date/Time   CALCIUM 10.0 02/12/2020 0858   ALKPHOS 56 02/12/2020 0858   AST 13 (L) 02/12/2020 0858   ALT 12 02/12/2020 0858   BILITOT 0.6 02/12/2020 0858       Results for [name] Holder (MRN [id number]) as of 05/14/2020 10:11  Ref. Range 04/14/2019 08:55 07/20/2019 00:00 02/12/2020 08:58  PSA Unknown  0.3   Prostate Specific Ag, Serum Latest Ref Range: 0.0 - 4.0 ng/mL 0.2  0.3     Impression and Plan:  64 year old man with:  1.    Castration-resistant prostate cancer with pelvic adenopathy diagnosed in 2018.     He continues to tolerate Zytiga without any major complications.  Risks and benefits of continuing this treatment long-term were discussed.  Complication occluding hypertension, edema and adrenal sufficiency related were discussed.  His PSA continues to be excellent I recommended continuing the same dose and schedule.  Different salvage therapy options will be required he develops progression of disease.    2.  Compression fracture tween T10 and T12: Appears to be osteoporosis related.  He is status post kyphoplasty..   3.  Bone directed therapy: He is on calcium and vitamin D supplements.  Bone directed therapy has been deferred till dental clearance.  4.  Pain: Currently on oxycodone was managing his pain.  5.  Prognosis and goals of care: Therapy remains palliative although aggressive measures are warranted given his reasonable performance status.   6.  Covid vaccination considerations: He has completed his booster and will receive flu  vaccination in the near future.   7.  Follow-up: In 3 months for repeat follow-up.  30  minutes were spent on this encounter.  The time was dedicated to reviewing his disease status, discussing treatment options and future plan of care review.      Zola Button, MD 10/19/202110:10 AM

## 2020-05-15 ENCOUNTER — Telehealth: Payer: Self-pay

## 2020-05-15 LAB — PROSTATE-SPECIFIC AG, SERUM (LABCORP): Prostate Specific Ag, Serum: 0.5 ng/mL (ref 0.0–4.0)

## 2020-05-15 NOTE — Telephone Encounter (Signed)
-----   Message from Wyatt Portela, MD sent at 05/15/2020  8:45 AM EDT ----- Please let him know his PSA changed very little. No changes needed.

## 2020-05-15 NOTE — Telephone Encounter (Signed)
Called patient with lab results per Dr Alen Blew. Patient verbalized understanding.

## 2020-05-22 DIAGNOSIS — R351 Nocturia: Secondary | ICD-10-CM | POA: Diagnosis not present

## 2020-05-22 DIAGNOSIS — C61 Malignant neoplasm of prostate: Secondary | ICD-10-CM | POA: Diagnosis not present

## 2020-05-22 DIAGNOSIS — Z8551 Personal history of malignant neoplasm of bladder: Secondary | ICD-10-CM | POA: Diagnosis not present

## 2020-05-24 ENCOUNTER — Other Ambulatory Visit: Payer: Self-pay | Admitting: Internal Medicine

## 2020-05-24 ENCOUNTER — Other Ambulatory Visit: Payer: Self-pay | Admitting: Oncology

## 2020-05-24 DIAGNOSIS — I10 Essential (primary) hypertension: Secondary | ICD-10-CM

## 2020-05-29 ENCOUNTER — Other Ambulatory Visit: Payer: Self-pay

## 2020-05-29 ENCOUNTER — Ambulatory Visit: Payer: Medicare Other | Admitting: Acute Care

## 2020-05-29 ENCOUNTER — Encounter: Payer: Self-pay | Admitting: Acute Care

## 2020-05-29 VITALS — BP 110/70 | HR 80 | Temp 97.0°F | Ht 67.0 in | Wt 228.0 lb

## 2020-05-29 DIAGNOSIS — E871 Hypo-osmolality and hyponatremia: Secondary | ICD-10-CM | POA: Diagnosis not present

## 2020-05-29 DIAGNOSIS — J449 Chronic obstructive pulmonary disease, unspecified: Secondary | ICD-10-CM | POA: Diagnosis not present

## 2020-05-29 MED ORDER — TRELEGY ELLIPTA 100-62.5-25 MCG/INH IN AEPB: 1.0000 | INHALATION_SPRAY | Freq: Every day | RESPIRATORY_TRACT | 0 refills | Status: AC

## 2020-05-29 MED ORDER — TRELEGY ELLIPTA 100-62.5-25 MCG/INH IN AEPB: 1.0000 | INHALATION_SPRAY | Freq: Every day | RESPIRATORY_TRACT | 0 refills | Status: DC

## 2020-05-29 NOTE — Patient Instructions (Addendum)
It is good to see you today. Continue your Trelegy one puff once daily Rinse mouth after use. Continue your albuterol puffer/ nebs as needed for shortness of breath or wheezing.  Use your incentive Spirometer 2-3 times daily. Sit it next to the TV and use when commercials come on.  We will send in a prescription for Trelegy. We will give you samples today.  Continue sodium replacement as you have been doing with follow up.  Follow up with Dr. Valeta Harms in 6 months or before as needed.  Please contact office for sooner follow up if symptoms do not improve or worsen or seek emergency care

## 2020-05-29 NOTE — Progress Notes (Signed)
History of Present Illness [name] Holder is a 64 y.o. male former smoker ( Quit 2014)with COPD Asthma Overlap Syndrome , and prostate cancer. He is followed by Dr. Valeta Harms.    05/29/2020  Pt. Presents for follow up. He was switched to Trelegy He does have some exertional dyspnea. He uses his rescue inhaler between 5-6 am, and then he does an albuterol neb, and then he uses his Trelegy, and this routine sets him up for a good day. He is in a wheelchair today. He is on chemo for prostate cancer, and he has significant back pain , which limits his activity. He is ambulatory in his home, but is in a wheelchair or scooter when he is out of the home. He has tried PT, but it exacerbates his back pain. He is more concerned with his quality of life. Saturation is 98% today. He does check his oxygen sats at home. He states he rarely drops below 95%.  We discussed his last CT Chest. We discussed that due to his decreased activity, he needs to use his incentive spirometer daily. I suggested that he put this bt the TV and use it whenever there is a commercial on  He feels the Trelegy is working well. He does need a new prescription. He usually uses his rescue inhaler only once daily. He states he rarely has a cough, and that when he does secretions are clear to white. He feels from a respiratory standpoint, that he has had a pretty stable interval.   Pt is compliant with sodium replacement therapy. Last Na was 139. He is taking 2 grams of Na po daily. This is being managed by his PCP.   Test Results:  Chest Imaging: CT Chest 02/17/2020 Slight interval increase in right middle lobe atelectasis. No central obstructing lesion.  Moderate coronary artery calcification.  Multiple thoracolumbar compression fractures, unchanged.  11/14/2019-CT chest without contrast-platelike opacity is noted in the right middle lobe which is enlarged compared to prior exam while this may simply represent progressive  postinfectious scarring or residual inflammation malignancy cannot be excluded, follow-up CT scan in 3 months is recommended, coronary artery calcifications, aortic arthrosclerosis   08/09/2019 CT chest: Patient with right middle lobe nodular opacity underlying malignancy not excluded.  Likely inflammatory related.  Possible community-acquired pneumonia.  Pulmonary Functions Testing Results: PFT Results Latest Ref Rng & Units 11/08/2019 07/25/2013  FVC-Pre L 3.02 4.60  FVC-Predicted Pre % 69 101  FVC-Post L 3.07 4.61  FVC-Predicted Post % 70 101  Pre FEV1/FVC % % 75 70  Post FEV1/FCV % % 77 72  FEV1-Pre L 2.27 3.21  FEV1-Predicted Pre % 69 93  FEV1-Post L 2.35 3.31  DLCO UNC% % 80 109  DLCO COR %Predicted % 90 94  TLC L 6.52 6.29  TLC % Predicted % 97 94  RV % Predicted % 145 73      CBC Latest Ref Rng & Units 05/14/2020 02/12/2020 08/31/2019  WBC 4.0 - 10.5 K/uL 9.3 9.4 9.4  Hemoglobin 13.0 - 17.0 g/dL 12.1(L) 12.4(L) 12.4(L)  Hematocrit 39 - 52 % 35.6(L) 36.9(L) 36.4(L)  Platelets 150 - 400 K/uL 265 276 433.0(H)    BMP Latest Ref Rng & Units 05/14/2020 02/12/2020 09/21/2019  Glucose 70 - 99 mg/dL 114(H) 111(H) 111(H)  BUN 8 - 23 mg/dL _0 Creatinine 0.61 - 1.24 mg/dL 0.79 0.86 0.84  Sodium 135 - 145 mmol/L 139 138 134(L)  Potassium 3.5 - 5.1 mmol/L 4.6 4.5  4.3  Chloride 98 - 111 mmol/L 105 104 98  CO2 22 - 32 mmol/L _0 Calcium 8.9 - 10.3 mg/dL 9.5 10.0 9.9    BNP    Component Value Date/Time   BNP 82.8 08/15/2019 1213    ProBNP    Component Value Date/Time   PROBNP 69.0 09/21/2019 1451    PFT    Component Value Date/Time   FEV1PRE 2.27 11/08/2019 1349   FEV1POST 2.35 11/08/2019 1349   FVCPRE 3.02 11/08/2019 1349   FVCPOST 3.07 11/08/2019 1349   TLC 6.52 11/08/2019 1349   DLCOUNC 20.52 11/08/2019 1349   PREFEV1FVCRT 75 11/08/2019 1349   PSTFEV1FVCRT 77 11/08/2019 1349    No results found.   Past medical hx Past Medical History:    Diagnosis Date  . Allergic rhinitis   . At risk for sleep apnea    STOP-BANG SCORE = 5    (routed to pt's pcp 09-08-2018)  . Chronic constipation   . Chronic low back pain   . Chronic pain   . DOE (dyspnea on exertion)   . History of kidney stones   . History of recent pneumonia 08/10/2018   acute bronchopneumonia;  follow-up cxr 08-28-2018 in epic  . History of vertebral compression fracture    T10 and T12 s/p kyphoplasty  . Hx of colonic polyps   . Hyperplasia of prostate with lower urinary tract symptoms (LUTS)   . Hypertension   . Mild persistent asthma    followed by pcp  . Numbness in both legs    secondary to a fall, per pt has had work-up and couldn't find anything  . Osteopenia   . Prostate cancer metastatic to pelvis [facility]) urologist-- dr borden/  oncologist -- dr Alen Blew   dx 2014 advanced prostate cancer with pelvid adenopathy,  Stage T2c, Gleason 7;  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  . Wears glasses      Social History   Tobacco Use  . Smoking status: Former Smoker    Packs/day: 1.00    Years: 25.00    Pack years: 25.00    Types: Cigarettes    Quit date: 09/08/2012    Years since quitting: 7.7  . Smokeless tobacco: Never Used  Vaping Use  . Vaping Use: Former  . Start date: 09/08/2012  . Quit date: 07/08/2018  . Devices: e-cigarette,  e-blu  Substance Use Topics  . Alcohol use: Not Currently    Alcohol/week: 2.0 standard drinks    Types: 2 Glasses of wine per week  . Drug use: Never    Mr.Kreiter reports that he quit smoking about 7 years ago. His smoking use included cigarettes. He has a 25.00 pack-year smoking history. He has never used smokeless tobacco. He reports previous alcohol use of about 2.0 standard drinks of alcohol per week. He reports that he does not use drugs.  Tobacco Cessation: Former smoker, quit 2014, with a 25 pack year smoking history.   Past surgical hx, Family hx, Social hx all reviewed.  Current Outpatient Medications on File Prior  to Visit  Medication Sig  . abiraterone acetate (ZYTIGA) 250 MG tablet Take 1,000 mg by mouth daily.   [name] acetaminophen (TYLENOL) 325 MG tablet Take 325 mg by mouth daily as needed for fever.  [name] albuterol (PROVENTIL) (2.5 MG/3ML) 0.083% nebulizer solution NEBULIZE 1 VIAL EVERY 6-8 HOURS AS NEEDED FOR WHEEZING OR SHORTNESS OF BREATH  . albuterol (VENTOLIN HFA) 108 (90 Base) MCG/ACT inhaler Inhale 2 puffs into  the lungs every 6 (six) hours as needed for wheezing. 2 PUFFS EVERY 6 HOURS AS NEEDED FOR WHEEZING  . Azelastine HCl 137 MCG/SPRAY SOLN USE 1 TO 2 SPRAYS IN EACH NOSTRIL EVERY 12 HOURS  . b complex vitamins tablet Take 1 tablet by mouth daily.  . CALCIUM-VITAMIN D PO Take 1 tablet by mouth 2 (two) times daily.   . carvedilol (COREG) 3.125 MG tablet TAKE  (1)  TABLET TWICE A DAY WITH MEALS (BREAKFAST AND SUPPER)  . fluticasone (FLONASE) 50 MCG/ACT nasal spray Place 2 sprays into both nostrils daily.  . Fluticasone-Umeclidin-Vilant (TRELEGY ELLIPTA) 100-62.5-25 MCG/INH AEPB Inhale 1 puff into the lungs daily.  . furosemide (LASIX) 20 MG tablet TAKE 1 TABLET DAILY  . Leuprolide Acetate (LUPRON IJ) Inject 1 Dose as directed every 4 (four) months. This is administered by [facility]  . Multiple Vitamins-Minerals (CENTRUM SILVER 50+MEN PO) Take 1 tablet by mouth daily.  . NARCAN 4 MG/0.1ML LIQD nasal spray kit Place 0.4 mg into the nose once as needed (overdose).   . olmesartan (BENICAR) 40 MG tablet TAKE 1 TABLET DAILY  . oxyCODONE (OXY IR/ROXICODONE) 5 MG immediate release tablet TAKE 1 TABLET EVERY 4 HOURS AS NEEDED FOR SEVERE PAIN  . predniSONE (DELTASONE) 5 MG tablet TAKE  (1)  TABLET TWICE A DAY.  . sodium chloride 1 g tablet Take 2 g by mouth 2 (two) times daily with a meal.  . solifenacin (VESICARE) 5 MG tablet Take 5 mg by mouth daily.  . [DISCONTINUED] carvedilol (COREG) 3.125 MG tablet Take 1 tablet (3.125 mg total) by mouth 2 (two) times daily with a meal.  . [DISCONTINUED]  olmesartan (BENICAR) 40 MG tablet TAKE 1 TABLET DAILY  . [DISCONTINUED] olmesartan (BENICAR) 40 MG tablet TAKE 1 TABLET DAILY   No current facility-administered medications on file prior to visit.     Allergies  Allergen Reactions  . Amlodipine Swelling    edema  . Tetracycline Rash    Review Of Systems:  Constitutional:   No  weight loss, night sweats,  Fevers, chills, fatigue, or  lassitude.  HEENT:   No headaches,  Difficulty swallowing,  Tooth/dental problems, or  Sore throat,                No sneezing, itching, ear ache, nasal congestion, post nasal drip,   CV:  No chest pain,  Orthopnea, PND, swelling in lower extremities, anasarca, dizziness, palpitations, syncope.   GI  No heartburn, indigestion, abdominal pain, nausea, vomiting, diarrhea, change in bowel habits, loss of appetite, bloody stools.   Resp: + shortness of breath with exertion less  at rest.  No excess mucus, no productive cough,  No non-productive cough,  No coughing up of blood.  No change in color of mucus.  No wheezing.  No chest wall deformity  Skin: no rash or lesions.  GU: no dysuria, change in color of urine, no urgency or frequency.  No flank pain, no hematuria   MS:  No joint pain or swelling.  + decreased range of motion.  No back pain.  Psych:  No change in mood or affect. No depression or anxiety.  No memory loss.   Vital Signs BP 110/70 (BP Location: Right Arm, Patient Position: Sitting, Cuff Size: Normal)   Pulse 80   Temp (!) 97 F (36.1 C) (Temporal)   Ht 5' 7" (1.702 m)   Wt 228 lb (103.4 kg)   SpO2 98%   BMI 35.71 kg/m      Physical Exam:  General- No distress,  A&Ox3, in wheelchair, on RA, very nice gentleman ENT: No sinus tenderness, TM clear, pale nasal mucosa, no oral exudate,no post nasal drip, no LAN Cardiac: S1, S2, regular rate and rhythm, no murmur Chest: No wheeze/ rales/ dullness; no accessory muscle use, no nasal flaring, no sternal retractions, slightly diminished  per bases.  Abd.: Soft Non-tender, ND, BS +, Body mass index is 35.71 kg/m. Ext: No clubbing cyanosis, edema Neuro:  MAE x 4, A&O x 3, deconditioned Skin: No rashes,No lesions,  warm and dry Psych: normal mood and behavior, appropriate   Assessment/Plan Asthma- COPD Overlap Syndrome ( HCC) Mixed restriction and obstruction on PFTs in April/2021 Patient reports childhood diagnosis of asthma 25-pack-year smoking history Doing well on Trelegy Plan Continue your Trelegy one puff once daily Rinse mouth after use. Continue your albuterol puffer/ nebs as needed for shortness of breath or wheezing.  Use your incentive Spirometer 2-3 times daily. Sit it next to the TV and use when commercials come on.  We will send in a prescription for Trelegy. We will give you samples today. Increase activity as able ( Back pain is limiting factor) Please contact office for sooner follow up if symptoms do not improve or worsen or seek emergency care Call us as you need us, do not wait.   Note your daily symptoms > remember "red flags" for COPD:  Increase in cough, increase in sputum production, increase in shortness of breath or activity intolerance. If you notice these symptoms, please call to be seen.     History of Hyponatremia Plan Continue sodium replacement as you have been doing with follow up labs per Dr. Ellison.   Prostate Cancer Diagnosed in 2018  Current Treatment: Zytiga 1000 mg daily with prednisone at 5 mg daily started in 2018. Eligard every 3 months Palliative Therapy with aggressive measures Plan Per Dr. Shadad  This appointment was 30 min long with over 50% of the time in direct face-to-face patient care, assessment, plan of care, and follow-up.     F , NP 05/29/2020  12:11 PM          

## 2020-05-30 NOTE — Progress Notes (Signed)
Thanks for seeing him  Garner Nash, DO [facility] 05/30/2020 8:45 AM

## 2020-06-14 ENCOUNTER — Other Ambulatory Visit: Payer: Self-pay | Admitting: Pulmonary Disease

## 2020-06-14 ENCOUNTER — Other Ambulatory Visit: Payer: Self-pay | Admitting: Oncology

## 2020-06-21 ENCOUNTER — Other Ambulatory Visit: Payer: Self-pay | Admitting: Oncology

## 2020-06-25 ENCOUNTER — Other Ambulatory Visit: Payer: Self-pay | Admitting: Internal Medicine

## 2020-06-25 DIAGNOSIS — I10 Essential (primary) hypertension: Secondary | ICD-10-CM

## 2020-07-15 ENCOUNTER — Other Ambulatory Visit: Payer: Self-pay | Admitting: Oncology

## 2020-07-15 NOTE — Telephone Encounter (Signed)
Dr. Chryl Heck, This is another request for a narcotic for one of Dr. Hazeline Junker patients.  Please refuse or refill as appropriate. Gardiner Rhyme, RN

## 2020-07-16 ENCOUNTER — Other Ambulatory Visit: Payer: Self-pay | Admitting: Internal Medicine

## 2020-08-05 ENCOUNTER — Other Ambulatory Visit: Payer: Self-pay | Admitting: Internal Medicine

## 2020-08-05 DIAGNOSIS — Z0000 Encounter for general adult medical examination without abnormal findings: Secondary | ICD-10-CM

## 2020-08-05 DIAGNOSIS — Z9109 Other allergy status, other than to drugs and biological substances: Secondary | ICD-10-CM

## 2020-08-08 ENCOUNTER — Telehealth: Payer: Self-pay | Admitting: Oncology

## 2020-08-08 NOTE — Telephone Encounter (Signed)
Called patient regarding upcoming appointment, patient is notified. °

## 2020-08-13 ENCOUNTER — Inpatient Hospital Stay: Payer: Medicare Other

## 2020-08-13 ENCOUNTER — Inpatient Hospital Stay: Payer: Medicare Other | Admitting: Oncology

## 2020-08-13 ENCOUNTER — Inpatient Hospital Stay: Payer: Medicare Other | Attending: Internal Medicine

## 2020-08-15 ENCOUNTER — Other Ambulatory Visit: Payer: Self-pay | Admitting: Internal Medicine

## 2020-08-15 ENCOUNTER — Other Ambulatory Visit: Payer: Self-pay | Admitting: Oncology

## 2020-08-15 DIAGNOSIS — J4521 Mild intermittent asthma with (acute) exacerbation: Secondary | ICD-10-CM

## 2020-08-15 DIAGNOSIS — I10 Essential (primary) hypertension: Secondary | ICD-10-CM

## 2020-08-21 ENCOUNTER — Telehealth: Payer: Self-pay | Admitting: Oncology

## 2020-08-21 NOTE — Telephone Encounter (Signed)
R/s 1/18 appt - pt is aware of new appt on 2/7

## 2020-09-02 ENCOUNTER — Inpatient Hospital Stay: Payer: Medicare Other

## 2020-09-02 ENCOUNTER — Inpatient Hospital Stay: Payer: Medicare Other | Admitting: Oncology

## 2020-09-04 ENCOUNTER — Other Ambulatory Visit: Payer: Self-pay

## 2020-09-04 ENCOUNTER — Inpatient Hospital Stay: Payer: Medicare Other | Attending: Oncology

## 2020-09-04 ENCOUNTER — Inpatient Hospital Stay: Payer: Medicare Other | Admitting: Oncology

## 2020-09-04 ENCOUNTER — Inpatient Hospital Stay: Payer: Medicare Other

## 2020-09-04 VITALS — BP 121/56 | HR 81 | Temp 97.0°F | Resp 18 | Ht 67.0 in | Wt 233.0 lb

## 2020-09-04 DIAGNOSIS — Z79899 Other long term (current) drug therapy: Secondary | ICD-10-CM | POA: Diagnosis not present

## 2020-09-04 DIAGNOSIS — C7952 Secondary malignant neoplasm of bone marrow: Secondary | ICD-10-CM

## 2020-09-04 DIAGNOSIS — C7951 Secondary malignant neoplasm of bone: Secondary | ICD-10-CM

## 2020-09-04 DIAGNOSIS — Z8546 Personal history of malignant neoplasm of prostate: Secondary | ICD-10-CM

## 2020-09-04 DIAGNOSIS — C61 Malignant neoplasm of prostate: Secondary | ICD-10-CM | POA: Diagnosis not present

## 2020-09-04 DIAGNOSIS — Z5111 Encounter for antineoplastic chemotherapy: Secondary | ICD-10-CM | POA: Insufficient documentation

## 2020-09-04 LAB — CBC WITH DIFFERENTIAL (CANCER CENTER ONLY)
Abs Immature Granulocytes: 0.06 10*3/uL (ref 0.00–0.07)
Basophils Absolute: 0.1 10*3/uL (ref 0.0–0.1)
Basophils Relative: 1 %
Eosinophils Absolute: 0.2 10*3/uL (ref 0.0–0.5)
Eosinophils Relative: 2 %
HCT: 34.9 % — ABNORMAL LOW (ref 39.0–52.0)
Hemoglobin: 11.6 g/dL — ABNORMAL LOW (ref 13.0–17.0)
Immature Granulocytes: 1 %
Lymphocytes Relative: 23 %
Lymphs Abs: 2.1 10*3/uL (ref 0.7–4.0)
MCH: 30.4 pg (ref 26.0–34.0)
MCHC: 33.2 g/dL (ref 30.0–36.0)
MCV: 91.4 fL (ref 80.0–100.0)
Monocytes Absolute: 0.9 10*3/uL (ref 0.1–1.0)
Monocytes Relative: 10 %
Neutro Abs: 5.7 10*3/uL (ref 1.7–7.7)
Neutrophils Relative %: 63 %
Platelet Count: 265 10*3/uL (ref 150–400)
RBC: 3.82 MIL/uL — ABNORMAL LOW (ref 4.22–5.81)
RDW: 13.2 % (ref 11.5–15.5)
WBC Count: 8.9 10*3/uL (ref 4.0–10.5)
nRBC: 0 % (ref 0.0–0.2)

## 2020-09-04 LAB — CMP (CANCER CENTER ONLY)
ALT: 16 U/L (ref 0–44)
AST: 13 U/L — ABNORMAL LOW (ref 15–41)
Albumin: 3.6 g/dL (ref 3.5–5.0)
Alkaline Phosphatase: 60 U/L (ref 38–126)
Anion gap: 7 (ref 5–15)
BUN: 18 mg/dL (ref 8–23)
CO2: 25 mmol/L (ref 22–32)
Calcium: 9.4 mg/dL (ref 8.9–10.3)
Chloride: 104 mmol/L (ref 98–111)
Creatinine: 0.86 mg/dL (ref 0.61–1.24)
GFR, Estimated: >60 mL/min (ref >60)
Glucose, Bld: 115 mg/dL — ABNORMAL HIGH (ref 70–99)
Potassium: 4.4 mmol/L (ref 3.5–5.1)
Sodium: 136 mmol/L (ref 135–145)
Total Bilirubin: 0.5 mg/dL (ref 0.3–1.2)
Total Protein: 6.3 g/dL — ABNORMAL LOW (ref 6.5–8.1)

## 2020-09-04 LAB — PSA: PSA: 0.6

## 2020-09-04 MED ORDER — LEUPROLIDE ACETATE (3 MONTH) 22.5 MG ~~LOC~~ KIT
22.5000 mg | PACK | Freq: Once | SUBCUTANEOUS | Status: AC
  Administered 2020-09-04: 22.5 mg via SUBCUTANEOUS

## 2020-09-04 MED ORDER — POLYETHYLENE GLYCOL 3350 17 G PO PACK: 17.0000 g | PACK | Freq: Every day | ORAL | 6 refills | Status: DC

## 2020-09-04 MED ORDER — LEUPROLIDE ACETATE (3 MONTH) 22.5 MG ~~LOC~~ KIT
PACK | SUBCUTANEOUS | Status: AC
  Filled 2020-09-04: qty 22.5

## 2020-09-04 NOTE — Patient Instructions (Signed)

## 2020-09-04 NOTE — Progress Notes (Signed)
Hematology and Oncology Follow Up Visit  [name] Holder [id number] [birth year] 65 y.o. 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    Prior Therapy:  Androgen deprivation therapy in 2014.  He developed castration resistant in 2018.  He is status post T10, T12, kyphoplasty completed on January 22 of 2020.  Current therapy:  Zytiga 1000 mg daily with prednisone at 5 mg daily started in 2018.  Eligard every 3 months.  Eligard given in October 2022 and will be repeated today.  Interim History: [name]  is here for repeat evaluation.  Since the last visit, he reports no major changes in his health.  He has reported some constipation issues but overall has not reported any increased pain or pathological fractures.  Denies any falls or syncope.  He denies any shortness of breath or difficulty breathing.  He has tolerated Zytiga without any complaints.  His appetite and quality of life remains maintained.          Medications: Unchanged on review. Current Outpatient Medications  Medication Sig Dispense Refill  . abiraterone acetate (ZYTIGA) 250 MG tablet Take 1,000 mg by mouth daily.   10  . acetaminophen (TYLENOL) 325 MG tablet Take 325 mg by mouth daily as needed for fever.    [name] albuterol (PROVENTIL) (2.5 MG/3ML) 0.083% nebulizer solution NEBULIZE 1 VIAL EVERY 6-8 HOURS AS NEEDED FOR WHEEZING OR SHORTNESS OF BREATH 360 mL 0  . albuterol (VENTOLIN HFA) 108 (90 Base) MCG/ACT inhaler Inhale 2 puffs into the lungs every 6 (six) hours as needed for wheezing. 2 PUFFS EVERY 6 HOURS AS NEEDED FOR WHEEZING 8.5 g 5  . Azelastine HCl 137 MCG/SPRAY SOLN USE 1 TO 2 SPRAYS IN EACH NOSTRIL EVERY 12 HOURS 30 mL 0  . b complex vitamins tablet Take 1 tablet by mouth daily.    [name] CALCIUM-VITAMIN D PO Take 1 tablet by mouth 2 (two) times daily.     .  carvedilol (COREG) 3.125 MG tablet TAKE  (1)  TABLET TWICE A DAY WITH MEALS (BREAKFAST AND SUPPER) 180 tablet 0  . fluticasone (FLONASE) 50 MCG/ACT nasal spray Place 2 sprays into both nostrils daily. 48 g 1  . furosemide (LASIX) 20 MG tablet TAKE 1 TABLET DAILY 90 tablet 0  . Leuprolide Acetate (LUPRON IJ) Inject 1 Dose as directed every 4 (four) months. This is administered by [name]    . Multiple Vitamins-Minerals (CENTRUM SILVER 50+MEN PO) Take 1 tablet by mouth daily.    [name] NARCAN 4 MG/0.1ML LIQD nasal spray kit Place 0.4 mg into the nose once as needed (overdose).     [name] olmesartan (BENICAR) 40 MG tablet TAKE 1 TABLET DAILY 90 tablet 0  . oxyCODONE (OXY IR/ROXICODONE) 5 MG immediate release tablet TAKE 1 TABLET EVERY 4 HOURS AS NEEDED FOR SEVERE PAIN 180 tablet 0  . predniSONE (DELTASONE) 5 MG tablet TAKE  (1)  TABLET TWICE A DAY. 60 tablet 0  . sodium chloride 1 g tablet Take 2 g by mouth 2 (two) times daily with a meal.    . solifenacin (VESICARE) 5 MG tablet Take 5 mg by mouth daily.    . TRELEGY ELLIPTA 100-62.5-25 MCG/INH AEPB INHALE 1 PUFF INTO LUNGS DAILY 60 each 5   No current facility-administered medications for this visit.     Allergies:  Allergies  Allergen Reactions  . Amlodipine Swelling  edema  . Tetracycline Rash      Physical Exam:  Blood pressure (!) 121/56, pulse 81, temperature (!) 97 F (36.1 C), temperature source Tympanic, resp. rate 18, height $RemoveBe'5\' 7"'zYLxvgVmK$  (1.702 m), weight 233 lb (105.7 kg), SpO2 98 %.     ECOG: 1   General appearance: Comfortable appearing without any discomfort Head: Normocephalic without any trauma Oropharynx: Mucous membranes are moist and pink without any thrush or ulcers. Eyes: Pupils are equal and round reactive to light. Lymph nodes: No cervical, supraclavicular, inguinal or axillary lymphadenopathy.   Heart:regular rate and rhythm.  S1 and S2 without leg edema. Lung: Clear without any rhonchi or wheezes.  No  dullness to percussion. Abdomin: Soft, nontender, nondistended with good bowel sounds.  No hepatosplenomegaly. Musculoskeletal: No joint deformity or effusion.  Full range of motion noted. Neurological: No deficits noted on motor, sensory and deep tendon reflex exam. Skin: No petechial rash or dryness.  Appeared moist.           Lab Results: Lab Results  Component Value Date   WBC 9.3 05/14/2020   HGB 12.1 (L) 05/14/2020   HCT 35.6 (L) 05/14/2020   MCV 91.8 05/14/2020   PLT 265 05/14/2020     Chemistry      Component Value Date/Time   NA 139 05/14/2020 0957   K 4.6 05/14/2020 0957   CL 105 05/14/2020 0957   CO2 28 05/14/2020 0957   BUN 22 05/14/2020 0957   CREATININE 0.79 05/14/2020 0957      Component Value Date/Time   CALCIUM 9.5 05/14/2020 0957   ALKPHOS 55 05/14/2020 0957   AST 10 (L) 05/14/2020 0957   ALT 13 05/14/2020 0957   BILITOT 0.3 05/14/2020 0957       Results for [name] Holder (MRN [id number]) as of 09/04/2020 09:58  Ref. Range 02/12/2020 08:58 05/14/2020 09:57  Prostate Specific Ag, Serum Latest Ref Range: 0.0 - 4.0 ng/mL 0.3 0.5      Impression and Plan:  65 year old man with:  1.    Advanced prostate cancer with lymphadenopathy diagnosed in 2018.  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   His disease status was updated today and treatment options were reviewed.  He is currently on Zytiga with reasonable PSA response is currently under control.  His PSA is showing small rise however of 0.5 in October 2021.  Treatment options moving forward including systemic chemotherapy, PARP inhibitor if he harbors appropriate mutation among others.  If his PSA continues to rise we will update his staging scans and determine best course of action accordingly.  We will arrange for imaging studies as well as guardant 360 analysis before the next visit.   2.  Compression fracture tween T10 and T12: Related to osteoporosis without any pathological  elements of his fracture.  Status post kyphoplasty with improvement.   3.  Bone directed therapy: I recommended continued calcium and vitamin D supplements.  Currently has been deferred until dental clearance is obtained.  4.  Pain: Related to his compression fracture and advanced prostate cancer.  Oxycodone is available to him with pain is manageable.  5.  Prognosis and goals of care: His disease is incurable although aggressive measures are warranted given his reasonable performance status.   6.  Constipation: We discussed strategies to improve including stool softeners on a daily basis with the pain medication.  Increasing hydration and fiber in his diet also discussed.   7.  Follow-up: He will return in 3  months for repeat follow-up.  30  minutes were dedicated to this visit.  Time was spent on reviewing disease status, treatment options and future plan of care discussion.      Zola Button, MD 2/9/20229:58 AM

## 2020-09-05 ENCOUNTER — Telehealth: Payer: Self-pay

## 2020-09-05 LAB — PROSTATE-SPECIFIC AG, SERUM (LABCORP): Prostate Specific Ag, Serum: 0.6 ng/mL (ref 0.0–4.0)

## 2020-09-05 NOTE — Telephone Encounter (Signed)
-----   Message from Wyatt Portela, MD sent at 09/05/2020  8:24 AM EST ----- Please let him know his PSA up slightly. No changes like we discussed for now

## 2020-09-05 NOTE — Telephone Encounter (Signed)
Called patient to make him aware of PSA result and per Dr. Alen Blew no changes for now like discussed. Patient verbalized understanding.

## 2020-09-14 ENCOUNTER — Other Ambulatory Visit: Payer: Self-pay | Admitting: Internal Medicine

## 2020-09-14 ENCOUNTER — Other Ambulatory Visit: Payer: Self-pay | Admitting: Oncology

## 2020-09-14 DIAGNOSIS — J4521 Mild intermittent asthma with (acute) exacerbation: Secondary | ICD-10-CM

## 2020-09-16 ENCOUNTER — Telehealth: Payer: Self-pay | Admitting: Internal Medicine

## 2020-09-16 DIAGNOSIS — I10 Essential (primary) hypertension: Secondary | ICD-10-CM

## 2020-09-16 MED ORDER — FUROSEMIDE 20 MG PO TABS: 20.0000 mg | ORAL_TABLET | Freq: Every day | ORAL | 0 refills | Status: DC

## 2020-09-16 NOTE — Addendum Note (Signed)
Addended by: Hinda Kehr on: 09/16/2020 01:41 PM   Modules accepted: Orders

## 2020-09-16 NOTE — Telephone Encounter (Signed)
° °  Patient requesting short supply of Lasix, until he can be seen 3/7

## 2020-09-30 ENCOUNTER — Ambulatory Visit (INDEPENDENT_AMBULATORY_CARE_PROVIDER_SITE_OTHER): Payer: Medicare Other | Admitting: Internal Medicine

## 2020-09-30 ENCOUNTER — Encounter: Payer: Self-pay | Admitting: Internal Medicine

## 2020-09-30 ENCOUNTER — Other Ambulatory Visit: Payer: Self-pay

## 2020-09-30 VITALS — BP 126/82 | HR 81 | Temp 98.9°F | Ht 67.0 in | Wt 233.0 lb

## 2020-09-30 DIAGNOSIS — J301 Allergic rhinitis due to pollen: Secondary | ICD-10-CM

## 2020-09-30 DIAGNOSIS — J4520 Mild intermittent asthma, uncomplicated: Secondary | ICD-10-CM

## 2020-09-30 DIAGNOSIS — Z0000 Encounter for general adult medical examination without abnormal findings: Secondary | ICD-10-CM

## 2020-09-30 DIAGNOSIS — J4521 Mild intermittent asthma with (acute) exacerbation: Secondary | ICD-10-CM | POA: Diagnosis not present

## 2020-09-30 DIAGNOSIS — E785 Hyperlipidemia, unspecified: Secondary | ICD-10-CM | POA: Diagnosis not present

## 2020-09-30 DIAGNOSIS — D638 Anemia in other chronic diseases classified elsewhere: Secondary | ICD-10-CM | POA: Diagnosis not present

## 2020-09-30 DIAGNOSIS — I10 Essential (primary) hypertension: Secondary | ICD-10-CM | POA: Diagnosis not present

## 2020-09-30 DIAGNOSIS — Z9109 Other allergy status, other than to drugs and biological substances: Secondary | ICD-10-CM

## 2020-09-30 LAB — HEPATIC FUNCTION PANEL
ALT: 14 U/L (ref 0–53)
AST: 12 U/L (ref 0–37)
Albumin: 4.1 g/dL (ref 3.5–5.2)
Alkaline Phosphatase: 60 U/L (ref 39–117)
Bilirubin, Direct: 0.1 mg/dL (ref 0.0–0.3)
Total Bilirubin: 0.5 mg/dL (ref 0.2–1.2)
Total Protein: 6.7 g/dL (ref 6.0–8.3)

## 2020-09-30 LAB — BASIC METABOLIC PANEL
BUN: 17 mg/dL (ref 6–23)
CO2: 30 mEq/L (ref 19–32)
Calcium: 9.9 mg/dL (ref 8.4–10.5)
Chloride: 102 mEq/L (ref 96–112)
Creatinine, Ser: 1 mg/dL (ref 0.40–1.50)
GFR: 78.33 mL/min (ref >60.00)
Glucose, Bld: 122 mg/dL — ABNORMAL HIGH (ref 70–99)
Potassium: 4.4 mEq/L (ref 3.5–5.1)
Sodium: 140 mEq/L (ref 135–145)

## 2020-09-30 LAB — LIPID PANEL
Cholesterol: 192 mg/dL (ref 0–200)
HDL: 47.3 mg/dL (ref >39.00)
LDL Cholesterol: 106 mg/dL — ABNORMAL HIGH (ref 0–99)
NonHDL: 144.46
Total CHOL/HDL Ratio: 4
Triglycerides: 190 mg/dL — ABNORMAL HIGH (ref 0.0–149.0)
VLDL: 38 mg/dL (ref 0.0–40.0)

## 2020-09-30 LAB — IRON: Iron: 75 ug/dL (ref 42–165)

## 2020-09-30 LAB — FERRITIN: Ferritin: 148.1 ng/mL (ref 22.0–322.0)

## 2020-09-30 LAB — VITAMIN B12: Vitamin B-12: 1252 pg/mL — ABNORMAL HIGH (ref 211–911)

## 2020-09-30 LAB — FOLATE: Folate: >23.6 ng/mL (ref >5.9)

## 2020-09-30 LAB — TSH: TSH: 0.86 u[IU]/mL (ref 0.35–4.50)

## 2020-09-30 MED ORDER — CARVEDILOL 3.125 MG PO TABS: 3.1250 mg | ORAL_TABLET | Freq: Two times a day (BID) | ORAL | 1 refills | Status: DC

## 2020-09-30 MED ORDER — OLMESARTAN MEDOXOMIL 40 MG PO TABS: 40.0000 mg | ORAL_TABLET | Freq: Every day | ORAL | 1 refills | Status: DC

## 2020-09-30 MED ORDER — FUROSEMIDE 20 MG PO TABS: 20.0000 mg | ORAL_TABLET | Freq: Every day | ORAL | 1 refills | Status: DC

## 2020-09-30 MED ORDER — ALBUTEROL SULFATE HFA 108 (90 BASE) MCG/ACT IN AERS: 2.0000 | INHALATION_SPRAY | Freq: Four times a day (QID) | RESPIRATORY_TRACT | 5 refills | Status: DC | PRN

## 2020-09-30 MED ORDER — FLUTICASONE PROPIONATE 50 MCG/ACT NA SUSP: 2.0000 | Freq: Every day | NASAL | 1 refills | Status: DC

## 2020-09-30 MED ORDER — ALBUTEROL SULFATE (2.5 MG/3ML) 0.083% IN NEBU: 2.5000 mg | INHALATION_SOLUTION | Freq: Four times a day (QID) | RESPIRATORY_TRACT | 1 refills | Status: DC | PRN

## 2020-09-30 MED ORDER — AZELASTINE HCL 137 MCG/SPRAY NA SOLN
2.0000 | Freq: Two times a day (BID) | NASAL | 1 refills | Status: DC
Start: 2020-09-30 — End: 2021-06-09

## 2020-09-30 NOTE — Progress Notes (Signed)
Subjective:  Patient ID: [name] Holder, male    DOB: [birth year]  Age: 65 y.o. MRN: [id number]  CC: Annual Exam, Anemia, and Hyperlipidemia  This visit occurred during the SARS-CoV-2 public health emergency.  Safety protocols were in place, including screening questions prior to the visit, additional usage of staff PPE, and extensive cleaning of exam room while observing appropriate contact time as indicated for disinfecting solutions.    HPI [name] Holder presents for a CPX.  He has his usual sx's but nothing that is new or worsening.  Outpatient Medications Prior to Visit  Medication Sig Dispense Refill  . abiraterone acetate (ZYTIGA) 250 MG tablet Take 1,000 mg by mouth daily.   10  . acetaminophen (TYLENOL) 325 MG tablet Take 325 mg by mouth daily as needed for fever.    [name] b complex vitamins tablet Take 1 tablet by mouth daily.    [name] CALCIUM-VITAMIN D PO Take 1 tablet by mouth 2 (two) times daily.     [name] Leuprolide Acetate (LUPRON IJ) Inject 1 Dose as directed every 4 (four) months. This is administered by [facility]    . Multiple Vitamins-Minerals (CENTRUM SILVER 50+MEN PO) Take 1 tablet by mouth daily.    [name] NARCAN 4 MG/0.1ML LIQD nasal spray kit Place 0.4 mg into the nose once as needed (overdose).     [name] oxyCODONE (OXY IR/ROXICODONE) 5 MG immediate release tablet TAKE 1 TABLET EVERY 4 HOURS AS NEEDED FOR SEVERE PAIN 180 tablet 0  . polyethylene glycol (MIRALAX) 17 g packet Take 17 g by mouth daily. 30 each 6  . predniSONE (DELTASONE) 5 MG tablet TAKE  (1)  TABLET TWICE A DAY. 60 tablet 0  . sodium chloride 1 g tablet Take 2 g by mouth 2 (two) times daily with a meal.    . solifenacin (VESICARE) 5 MG tablet Take 5 mg by mouth daily.    . TRELEGY ELLIPTA 100-62.5-25 MCG/INH AEPB INHALE 1 PUFF INTO LUNGS DAILY 60 each 5  . albuterol (PROVENTIL) (2.5 MG/3ML) 0.083% nebulizer solution NEBULIZE 1 VIAL EVERY 6-8 HOURS AS NEEDED FOR WHEEZING OR SHORTNESS OF BREATH 360 mL 0  .  albuterol (VENTOLIN HFA) 108 (90 Base) MCG/ACT inhaler Inhale 2 puffs into the lungs every 6 (six) hours as needed for wheezing. 2 PUFFS EVERY 6 HOURS AS NEEDED FOR WHEEZING 8.5 g 5  . Azelastine HCl 137 MCG/SPRAY SOLN USE 1 TO 2 SPRAYS IN EACH NOSTRIL EVERY 12 HOURS 30 mL 0  . carvedilol (COREG) 3.125 MG tablet TAKE  (1)  TABLET TWICE A DAY WITH MEALS (BREAKFAST AND SUPPER) 180 tablet 0  . fluticasone (FLONASE) 50 MCG/ACT nasal spray Place 2 sprays into both nostrils daily. 48 g 1  . furosemide (LASIX) 20 MG tablet Take 1 tablet (20 mg total) by mouth daily. APPOINTMENT NEEDED FOR ADDITIONAL REFILLS 30 tablet 0  . olmesartan (BENICAR) 40 MG tablet TAKE 1 TABLET DAILY 90 tablet 0   No facility-administered medications prior to visit.    ROS Review of Systems  Constitutional: Negative for appetite change, chills, diaphoresis, fatigue and fever.  HENT: Positive for congestion and rhinorrhea. Negative for nosebleeds, sinus pressure, sinus pain and sore throat.   Eyes: Negative.   Respiratory: Positive for shortness of breath and wheezing. Negative for cough, choking, chest tightness and stridor.   Cardiovascular: Negative for chest pain, palpitations and leg swelling.  Gastrointestinal: Positive for constipation. Negative for abdominal pain, diarrhea and nausea.  Endocrine: Negative.  Genitourinary: Negative.  Negative for difficulty urinating and dysuria.  Musculoskeletal: Positive for back pain and gait problem. Negative for joint swelling and myalgias.  Skin: Negative.   Neurological: Negative for weakness, light-headedness and numbness.  Hematological: Negative for adenopathy. Does not bruise/bleed easily.  Psychiatric/Behavioral: Negative.     Objective:  BP 126/82   Pulse 81   Temp 98.9 F (37.2 C) (Oral)   Ht $R'5\' 7"'Ak$  (1.702 m)   Wt 233 lb (105.7 kg)   SpO2 97%   BMI 36.49 kg/m   BP Readings from Last 3 Encounters:  09/30/20 126/82  09/04/20 (!) 121/56  05/29/20 110/70     Wt Readings from Last 3 Encounters:  09/30/20 233 lb (105.7 kg)  09/04/20 233 lb (105.7 kg)  05/29/20 228 lb (103.4 kg)    Physical Exam Vitals reviewed.  Constitutional:      Appearance: He is ill-appearing (in a wheelchair).  HENT:     Nose: Nose normal.     Mouth/Throat:     Mouth: Mucous membranes are moist.  Eyes:     General: No scleral icterus.    Conjunctiva/sclera: Conjunctivae normal.  Cardiovascular:     Rate and Rhythm: Normal rate and regular rhythm.     Heart sounds: No murmur heard.   Pulmonary:     Effort: Pulmonary effort is normal.     Breath sounds: No stridor. No wheezing, rhonchi or rales.  Abdominal:     General: Abdomen is protuberant. Bowel sounds are normal. There is no distension.     Palpations: Abdomen is soft. There is no hepatomegaly, splenomegaly or mass.     Hernia: No hernia is present.  Musculoskeletal:        General: Normal range of motion.     Cervical back: Neck supple.     Right lower leg: No edema.     Left lower leg: No edema.  Lymphadenopathy:     Cervical: No cervical adenopathy.  Skin:    General: Skin is warm and dry.     Coloration: Skin is pale.     Findings: No rash.  Neurological:     General: No focal deficit present.     Mental Status: He is alert.  Psychiatric:        Mood and Affect: Mood normal.        Behavior: Behavior normal.     Lab Results  Component Value Date   WBC 8.9 09/04/2020   HGB 11.6 (L) 09/04/2020   HCT 34.9 (L) 09/04/2020   PLT 265 09/04/2020   GLUCOSE 122 (H) 09/30/2020   CHOL 192 09/30/2020   TRIG 190.0 (H) 09/30/2020   HDL 47.30 09/30/2020   LDLCALC 106 (H) 09/30/2020   ALT 14 09/30/2020   AST 12 09/30/2020   NA 140 09/30/2020   K 4.4 09/30/2020   CL 102 09/30/2020   CREATININE 1.00 09/30/2020   BUN 17 09/30/2020   CO2 30 09/30/2020   TSH 0.86 09/30/2020   PSA 0.6 09/04/2020   INR 0.94 08/26/2018   HGBA1C 5.2 08/31/2019    CT Chest Wo Contrast  Result Date:  02/17/2020 CLINICAL DATA:  Abnormal chest x-ray, asthma, COPD. EXAM: CT CHEST WITHOUT CONTRAST TECHNIQUE: Multidetector CT imaging of the chest was performed following the standard protocol without IV contrast. COMPARISON:  11/14/2019 FINDINGS: Cardiovascular: Moderate multi-vessel coronary artery calcification. Global cardiac size within normal limits. The central pulmonary arteries are enlarged in keeping with changes of pulmonary arterial hypertension. The thoracic  aorta is of normal caliber. Mild atherosclerotic calcification is seen within the descending thoracic aorta. Mediastinum/Nodes: No enlarged mediastinal or axillary lymph nodes. Thyroid gland, trachea, and esophagus demonstrate no significant findings. Lungs/Pleura: Since the prior examination, there has developed increasing right middle lobe atelectasis lingular atelectasis appears relatively stable. No central obstructing mass is identified. No superimposed focal pulmonary nodules or infiltrates. No pneumothorax or pleural effusion. Upper Abdomen: No acute abnormality. Musculoskeletal: Multiple healed left rib fractures are noted. Multiple thoracic compression fractures are again seen with several levels having undergone vertebroplasty. These are unchanged from prior examination. IMPRESSION: Slight interval increase in right middle lobe atelectasis. No central obstructing lesion. Moderate coronary artery calcification. Multiple thoracolumbar compression fractures, unchanged. Aortic Atherosclerosis (ICD10-I70.0). Electronically Signed   By: Fidela Salisbury MD   On: 02/17/2020 21:33    Assessment & Plan:   [name] Holder was seen today for annual exam, anemia and hyperlipidemia.  Diagnoses and all orders for this visit:  Essential hypertension- His BP is well controlled. Lytes and renal fxn are onrmal. -     carvedilol (COREG) 3.125 MG tablet; Take 1 tablet (3.125 mg total) by mouth 2 (two) times daily with a meal. -     furosemide (LASIX) 20 MG  tablet; Take 1 tablet (20 mg total) by mouth daily. -     olmesartan (BENICAR) 40 MG tablet; Take 1 tablet (40 mg total) by mouth daily. -     TSH; Future -     Basic metabolic panel; Future -     Basic metabolic panel -     TSH  Routine general medical examination at a health care facility- Exam complete, labs reviewed, vaccines reviewed, cancer screenings are UTD. -     Azelastine HCl 137 MCG/SPRAY SOLN; Place 2 puffs into the nose in the morning and at bedtime.  Hyperlipidemia with target LDL less than 100- statin tx is not indicated. -     Lipid panel; Future -     TSH; Future -     Hepatic function panel; Future -     Hepatic function panel -     TSH -     Lipid panel  Mild intermittent asthma with acute exacerbation -     albuterol (PROVENTIL) (2.5 MG/3ML) 0.083% nebulizer solution; Take 3 mLs (2.5 mg total) by nebulization every 6 (six) hours as needed for wheezing or shortness of breath.  Mild intermittent asthma without complication -     albuterol (VENTOLIN HFA) 108 (90 Base) MCG/ACT inhaler; Inhale 2 puffs into the lungs every 6 (six) hours as needed for wheezing. 2 PUFFS EVERY 6 HOURS AS NEEDED FOR WHEEZING  Environmental allergies -     Azelastine HCl 137 MCG/SPRAY SOLN; Place 2 puffs into the nose in the morning and at bedtime.  Seasonal allergic rhinitis due to pollen -     fluticasone (FLONASE) 50 MCG/ACT nasal spray; Place 2 sprays into both nostrils daily.  Anemia, chronic disease- Will screen for vit deficiencies. -     Iron; Future -     Vitamin B12; Future -     Folate; Future -     Ferritin; Future -     Vitamin B1; Future -     Vitamin B1 -     Ferritin -     Folate -     Vitamin B12 -     Iron   I have changed [place]. [place]'s albuterol, Azelastine HCl, carvedilol, furosemide, and olmesartan. I am also  having him maintain his CALCIUM-VITAMIN D PO, abiraterone acetate, Leuprolide Acetate (LUPRON IJ), Narcan, acetaminophen, Multiple  Vitamins-Minerals (CENTRUM SILVER 50+MEN PO), b complex vitamins, solifenacin, sodium chloride, Trelegy Ellipta, polyethylene glycol, oxyCODONE, predniSONE, albuterol, and fluticasone.  Meds ordered this encounter  Medications  . albuterol (PROVENTIL) (2.5 MG/3ML) 0.083% nebulizer solution    Sig: Take 3 mLs (2.5 mg total) by nebulization every 6 (six) hours as needed for wheezing or shortness of breath.    Dispense:  360 mL    Refill:  1  . albuterol (VENTOLIN HFA) 108 (90 Base) MCG/ACT inhaler    Sig: Inhale 2 puffs into the lungs every 6 (six) hours as needed for wheezing. 2 PUFFS EVERY 6 HOURS AS NEEDED FOR WHEEZING    Dispense:  8.5 g    Refill:  5  . Azelastine HCl 137 MCG/SPRAY SOLN    Sig: Place 2 puffs into the nose in the morning and at bedtime.    Dispense:  90 mL    Refill:  1  . carvedilol (COREG) 3.125 MG tablet    Sig: Take 1 tablet (3.125 mg total) by mouth 2 (two) times daily with a meal.    Dispense:  180 tablet    Refill:  1  . furosemide (LASIX) 20 MG tablet    Sig: Take 1 tablet (20 mg total) by mouth daily.    Dispense:  90 tablet    Refill:  1  . fluticasone (FLONASE) 50 MCG/ACT nasal spray    Sig: Place 2 sprays into both nostrils daily.    Dispense:  48 g    Refill:  1  . olmesartan (BENICAR) 40 MG tablet    Sig: Take 1 tablet (40 mg total) by mouth daily.    Dispense:  90 tablet    Refill:  1     Follow-up: Return in about 6 months (around 04/02/2021).  Scarlette Calico, MD

## 2020-09-30 NOTE — Patient Instructions (Signed)

## 2020-10-04 LAB — VITAMIN B1: Vitamin B1 (Thiamine): 124 nmol/L — ABNORMAL HIGH (ref 8–30)

## 2020-10-18 ENCOUNTER — Other Ambulatory Visit: Payer: Self-pay | Admitting: Internal Medicine

## 2020-10-18 ENCOUNTER — Other Ambulatory Visit: Payer: Self-pay | Admitting: Oncology

## 2020-10-18 DIAGNOSIS — J4521 Mild intermittent asthma with (acute) exacerbation: Secondary | ICD-10-CM

## 2020-10-18 DIAGNOSIS — I10 Essential (primary) hypertension: Secondary | ICD-10-CM

## 2020-10-30 ENCOUNTER — Other Ambulatory Visit: Payer: Self-pay | Admitting: Oncology

## 2020-11-07 ENCOUNTER — Telehealth: Payer: Self-pay

## 2020-11-07 ENCOUNTER — Other Ambulatory Visit: Payer: Self-pay

## 2020-11-07 DIAGNOSIS — Z8546 Personal history of malignant neoplasm of prostate: Secondary | ICD-10-CM

## 2020-11-07 MED ORDER — ABIRATERONE ACETATE 250 MG PO TABS: 1000.0000 mg | ORAL_TABLET | Freq: Every day | ORAL | 10 refills | Status: DC

## 2020-11-07 NOTE — Telephone Encounter (Signed)
-----   Message from Wyatt Portela, MD sent at 11/07/2020 12:46 PM EDT ----- Madaline Brilliant. Noted. Thanks ----- Message ----- From: Burna Mortimer, CMA Sent: 11/07/2020  12:45 PM EDT To: Wyatt Portela, MD  Zytiga sent.  His wife called wanting to inform you that his dental work has been completed by Dr. Carlye Grippe with [facility]. She states that she was to inform you of this in order for the patient to proceed with Prolia injections. Please advise, thank you. ----- Message ----- From: Wyatt Portela, MD Sent: 11/07/2020  11:41 AM EDT To: Burna Mortimer, CMA  Please refill. Thanks ----- Message ----- From: Burna Mortimer, CMA Sent: 11/07/2020  11:28 AM EDT To: Wyatt Portela, MD  Dr. Alen Blew,  Patient is calling in regards to his Zytiga being refilled. Is it okay to refill this for the patient?  Thanks, Myriam Jacobson

## 2020-11-25 ENCOUNTER — Other Ambulatory Visit: Payer: Self-pay | Admitting: Oncology

## 2020-12-02 ENCOUNTER — Other Ambulatory Visit: Payer: Medicare Other

## 2020-12-04 ENCOUNTER — Inpatient Hospital Stay: Payer: Medicare Other | Attending: Oncology

## 2020-12-04 ENCOUNTER — Ambulatory Visit (HOSPITAL_COMMUNITY)
Admission: RE | Admit: 2020-12-04 | Discharge: 2020-12-04 | Disposition: A | Discharge status: Home / Self Care | Payer: Medicare Other | Source: Ambulatory Visit | Attending: Oncology | Admitting: Oncology

## 2020-12-04 ENCOUNTER — Other Ambulatory Visit: Payer: Self-pay

## 2020-12-04 ENCOUNTER — Encounter (HOSPITAL_COMMUNITY): Payer: Self-pay

## 2020-12-04 DIAGNOSIS — Z5111 Encounter for antineoplastic chemotherapy: Secondary | ICD-10-CM | POA: Insufficient documentation

## 2020-12-04 DIAGNOSIS — C7952 Secondary malignant neoplasm of bone marrow: Secondary | ICD-10-CM

## 2020-12-04 DIAGNOSIS — C7951 Secondary malignant neoplasm of bone: Secondary | ICD-10-CM | POA: Diagnosis not present

## 2020-12-04 DIAGNOSIS — C61 Malignant neoplasm of prostate: Secondary | ICD-10-CM | POA: Diagnosis not present

## 2020-12-04 DIAGNOSIS — S22060A Wedge compression fracture of T7-T8 vertebra, initial encounter for closed fracture: Secondary | ICD-10-CM | POA: Diagnosis not present

## 2020-12-04 DIAGNOSIS — Z8546 Personal history of malignant neoplasm of prostate: Secondary | ICD-10-CM

## 2020-12-04 DIAGNOSIS — Z79899 Other long term (current) drug therapy: Secondary | ICD-10-CM | POA: Insufficient documentation

## 2020-12-04 DIAGNOSIS — S22070A Wedge compression fracture of T9-T10 vertebra, initial encounter for closed fracture: Secondary | ICD-10-CM | POA: Diagnosis not present

## 2020-12-04 DIAGNOSIS — N281 Cyst of kidney, acquired: Secondary | ICD-10-CM | POA: Diagnosis not present

## 2020-12-04 HISTORY — DX: Malignant neoplasm of bladder, unspecified: C67.9

## 2020-12-04 LAB — CBC WITH DIFFERENTIAL (CANCER CENTER ONLY)
Abs Immature Granulocytes: 0.03 10*3/uL (ref 0.00–0.07)
Basophils Absolute: 0 10*3/uL (ref 0.0–0.1)
Basophils Relative: 0 %
Eosinophils Absolute: 0.2 10*3/uL (ref 0.0–0.5)
Eosinophils Relative: 2 %
HCT: 39 % (ref 39.0–52.0)
Hemoglobin: 13 g/dL (ref 13.0–17.0)
Immature Granulocytes: 0 %
Lymphocytes Relative: 25 %
Lymphs Abs: 2.3 10*3/uL (ref 0.7–4.0)
MCH: 30.4 pg (ref 26.0–34.0)
MCHC: 33.3 g/dL (ref 30.0–36.0)
MCV: 91.1 fL (ref 80.0–100.0)
Monocytes Absolute: 0.7 10*3/uL (ref 0.1–1.0)
Monocytes Relative: 8 %
Neutro Abs: 5.9 10*3/uL (ref 1.7–7.7)
Neutrophils Relative %: 65 %
Platelet Count: 270 10*3/uL (ref 150–400)
RBC: 4.28 MIL/uL (ref 4.22–5.81)
RDW: 13.2 % (ref 11.5–15.5)
WBC Count: 9.2 10*3/uL (ref 4.0–10.5)
nRBC: 0 % (ref 0.0–0.2)

## 2020-12-04 LAB — CMP (CANCER CENTER ONLY)
ALT: 14 U/L (ref 0–44)
AST: 14 U/L — ABNORMAL LOW (ref 15–41)
Albumin: 3.8 g/dL (ref 3.5–5.0)
Alkaline Phosphatase: 63 U/L (ref 38–126)
Anion gap: 9 (ref 5–15)
BUN: 19 mg/dL (ref 8–23)
CO2: 25 mmol/L (ref 22–32)
Calcium: 9.7 mg/dL (ref 8.9–10.3)
Chloride: 105 mmol/L (ref 98–111)
Creatinine: 0.81 mg/dL (ref 0.61–1.24)
GFR, Estimated: >60 mL/min (ref >60)
Glucose, Bld: 105 mg/dL — ABNORMAL HIGH (ref 70–99)
Potassium: 4.3 mmol/L (ref 3.5–5.1)
Sodium: 139 mmol/L (ref 135–145)
Total Bilirubin: 0.4 mg/dL (ref 0.3–1.2)
Total Protein: 6.7 g/dL (ref 6.5–8.1)

## 2020-12-04 MED ORDER — IOHEXOL 300 MG/ML  SOLN
75.0000 mL | Freq: Once | INTRAMUSCULAR | Status: AC | PRN
  Administered 2020-12-04: 75 mL via INTRAVENOUS

## 2020-12-04 MED ORDER — TECHNETIUM TC 99M MEDRONATE IV KIT
20.3000 | PACK | Freq: Once | INTRAVENOUS | Status: AC
  Administered 2020-12-04: 20.3 via INTRAVENOUS

## 2020-12-04 MED ORDER — SODIUM CHLORIDE 0.9 % IV SOLN
INTRAVENOUS | Status: AC
  Filled 2020-12-04: qty 250

## 2020-12-04 MED ORDER — IOHEXOL 9 MG/ML PO SOLN
ORAL | Status: AC
  Filled 2020-12-04: qty 1000

## 2020-12-04 MED ORDER — IOHEXOL 9 MG/ML PO SOLN
500.0000 mL | ORAL | Status: AC
  Administered 2020-12-04 (×2): 500 mL via ORAL

## 2020-12-04 MED ORDER — IOHEXOL 300 MG/ML  SOLN: 100.0000 mL | Freq: Once | INTRAMUSCULAR | Status: DC | PRN

## 2020-12-05 LAB — PROSTATE-SPECIFIC AG, SERUM (LABCORP): Prostate Specific Ag, Serum: 0.7 ng/mL (ref 0.0–4.0)

## 2020-12-10 ENCOUNTER — Other Ambulatory Visit: Payer: Self-pay

## 2020-12-10 ENCOUNTER — Inpatient Hospital Stay: Payer: Medicare Other | Admitting: Oncology

## 2020-12-10 ENCOUNTER — Inpatient Hospital Stay: Payer: Medicare Other

## 2020-12-10 ENCOUNTER — Telehealth: Payer: Self-pay | Admitting: Oncology

## 2020-12-10 VITALS — BP 128/78 | HR 77 | Temp 98.7°F | Resp 18 | Wt 224.8 lb

## 2020-12-10 DIAGNOSIS — Z79899 Other long term (current) drug therapy: Secondary | ICD-10-CM | POA: Diagnosis not present

## 2020-12-10 DIAGNOSIS — Z8546 Personal history of malignant neoplasm of prostate: Secondary | ICD-10-CM

## 2020-12-10 DIAGNOSIS — C7952 Secondary malignant neoplasm of bone marrow: Secondary | ICD-10-CM

## 2020-12-10 DIAGNOSIS — Z5111 Encounter for antineoplastic chemotherapy: Secondary | ICD-10-CM | POA: Diagnosis not present

## 2020-12-10 DIAGNOSIS — C7951 Secondary malignant neoplasm of bone: Secondary | ICD-10-CM

## 2020-12-10 DIAGNOSIS — C61 Malignant neoplasm of prostate: Secondary | ICD-10-CM | POA: Diagnosis not present

## 2020-12-10 MED ORDER — LEUPROLIDE ACETATE (3 MONTH) 22.5 MG ~~LOC~~ KIT
22.5000 mg | PACK | Freq: Once | SUBCUTANEOUS | Status: AC
  Administered 2020-12-10: 22.5 mg via SUBCUTANEOUS

## 2020-12-10 MED ORDER — LEUPROLIDE ACETATE (3 MONTH) 22.5 MG ~~LOC~~ KIT
PACK | SUBCUTANEOUS | Status: AC
  Filled 2020-12-10: qty 22.5

## 2020-12-10 MED ORDER — PREDNISONE 5 MG PO TABS: ORAL_TABLET | ORAL | 3 refills | Status: DC

## 2020-12-10 MED ORDER — OXYCODONE HCL 5 MG PO TABS: ORAL_TABLET | ORAL | 0 refills | Status: DC

## 2020-12-10 NOTE — Telephone Encounter (Signed)
Scheduled per los. Confirmed appts with patient. Declined printout  

## 2020-12-10 NOTE — Patient Instructions (Signed)

## 2020-12-10 NOTE — Progress Notes (Signed)
Hematology and Oncology Follow Up Visit  [name] Holder [id number] 1955/08/29 65 y.o. 12/10/2020 8:50 AM [name] Holder, MDJones, [name], MD   Principle Diagnosis: 65 year old man with advanced prostate cancer with bone disease and lymphadenopathy diagnosed in 2014.  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  Prior Therapy:  Androgen deprivation therapy in 2014.  He developed castration resistant in 2018.  He is status post T10, T12, kyphoplasty completed on January 22 of 2020.  Current therapy:  Zytiga 1000 mg daily with prednisone at 5 mg daily started in 2018.  Eligard every 3 months.  He will receive Eligard today.  Interim History: Mr. Vivona returns today for repeat follow-up.  Since the last visit, he reports no major changes in his health.  He continues to tolerate Zytiga without any major complaints.  He denies any nausea, fatigue or worsening edema.  He denies any recent falls or syncope.  He does not apparently go without the help of walker at home.  He reports has bone pain continues to be manageable with the current oxycodone regimen.          Medications: Updated on review. Current Outpatient Medications  Medication Sig Dispense Refill  . abiraterone acetate (ZYTIGA) 250 MG tablet Take 4 tablets (1,000 mg total) by mouth daily. 120 tablet 10  . acetaminophen (TYLENOL) 325 MG tablet Take 325 mg by mouth daily as needed for fever.    [name] albuterol (PROVENTIL) (2.5 MG/3ML) 0.083% nebulizer solution NEBULIZE 1 VIAL EVERY 6-8 HOURS AS NEEDED FOR WHEEZING OR SHORTNESS OF BREATH 360 mL 1  . albuterol (VENTOLIN HFA) 108 (90 Base) MCG/ACT inhaler Inhale 2 puffs into the lungs every 6 (six) hours as needed for wheezing. 2 PUFFS EVERY 6 HOURS AS NEEDED FOR WHEEZING 8.5 g 5  . Azelastine HCl 137 MCG/SPRAY SOLN Place 2 puffs into the nose in the morning and at bedtime. 90 mL 1  . b complex vitamins tablet Take 1 tablet by mouth daily.    [name] CALCIUM-VITAMIN D PO Take  1 tablet by mouth 2 (two) times daily.     . carvedilol (COREG) 3.125 MG tablet TAKE  (1)  TABLET TWICE A DAY WITH MEALS (BREAKFAST AND SUPPER) 180 tablet 1  . fluticasone (FLONASE) 50 MCG/ACT nasal spray Place 2 sprays into both nostrils daily. 48 g 1  . furosemide (LASIX) 20 MG tablet TAKE 1 TABLET ONCE A DAY APPOINTMENT NEEDED FOR ADDITIONAL REFILLS 90 tablet 1  . Leuprolide Acetate (LUPRON IJ) Inject 1 Dose as directed every 4 (four) months. This is administered by [facility]    . Multiple Vitamins-Minerals (CENTRUM SILVER 50+MEN PO) Take 1 tablet by mouth daily.    [name] NARCAN 4 MG/0.1ML LIQD nasal spray kit Place 0.4 mg into the nose once as needed (overdose).     [name] olmesartan (BENICAR) 40 MG tablet Take 1 tablet (40 mg total) by mouth daily. 90 tablet 1  . oxyCODONE (OXY IR/ROXICODONE) 5 MG immediate release tablet TAKE 1 TABLET EVERY 4 HOURS AS NEEDED FOR SEVERE PAIN 180 tablet 0  . polyethylene glycol (MIRALAX) 17 g packet Take 17 g by mouth daily. 30 each 6  . predniSONE (DELTASONE) 5 MG tablet TAKE (1) TABLET TWICE A DAY. 60 tablet 0  . sodium chloride 1 g tablet Take 2 g by mouth 2 (two) times daily with a meal.    . solifenacin (VESICARE) 5 MG tablet Take 5 mg by mouth daily.    [name] Holder  ELLIPTA 100-62.5-25 MCG/INH AEPB INHALE 1 PUFF INTO LUNGS DAILY 60 each 5   No current facility-administered medications for this visit.     Allergies:  Allergies  Allergen Reactions  . Amlodipine Swelling    edema  . Tetracycline Rash      Physical Exam:   Blood pressure 128/78, pulse 77, temperature 98.7 F (37.1 C), temperature source Oral, resp. rate 18, weight 224 lb 12.8 oz (102 kg), SpO2 100 %.     ECOG: 1    General appearance: Alert, awake without any distress. Head: Atraumatic without abnormalities Oropharynx: Without any thrush or ulcers. Eyes: No scleral icterus. Lymph nodes: No lymphadenopathy noted in the cervical, supraclavicular, or axillary  nodes Heart:regular rate and rhythm, without any murmurs or gallops.   Lung: Clear to auscultation without any rhonchi, wheezes or dullness to percussion. Abdomin: Soft, nontender without any shifting dullness or ascites. Musculoskeletal: No clubbing or cyanosis. Neurological: No motor or sensory deficits. Skin: No rashes or lesions.           Lab Results: Lab Results  Component Value Date   WBC 9.2 12/04/2020   HGB 13.0 12/04/2020   HCT 39.0 12/04/2020   MCV 91.1 12/04/2020   PLT 270 12/04/2020     Chemistry      Component Value Date/Time   NA 139 12/04/2020 0942   K 4.3 12/04/2020 0942   CL 105 12/04/2020 0942   CO2 25 12/04/2020 0942   BUN 19 12/04/2020 0942   CREATININE 0.81 12/04/2020 0942      Component Value Date/Time   CALCIUM 9.7 12/04/2020 0942   ALKPHOS 63 12/04/2020 0942   AST 14 (L) 12/04/2020 0942   ALT 14 12/04/2020 0942   BILITOT 0.4 12/04/2020 0942        Results for BARAN, KUHRT (MRN [id number]) as of 12/10/2020 08:51  Ref. Range 09/04/2020 10:03 12/04/2020 09:42  Prostate Specific Ag, Serum Latest Ref Range: 0.0 - 4.0 ng/mL 0.6 0.7    IMPRESSION: 1. No evidence for metastatic disease in the abdomen or pelvis. 2. Bilateral renal cysts. 3. Compression fractures at T8 and T9 are new since 07/28/2018. Multilevel thoracolumbar vertebral augmentation noted. 4. Aortic Atherosclerosis (ICD10-I70.0).  IMPRESSION: Marked decrease in degree of tracer uptake at previously identified vertebral lesions of the thoracolumbar spine consistent with response to treatment.  No new scintigraphic abnormalities identified.   Impression and Plan:  65 year old man with:  1.    Castration-resistant advanced prostate cancer with lymphadenopathy diagnosed in 2018.    His disease status was updated today including review of his laboratory data and imaging studies.  His PSA continues to show very slow rise but imaging studies showed overall stable  disease.  CT scan and bone scan obtained on Dec 04, 2020 were personally reviewed and discussed with the patient today and showed no evidence of disease progression.  Based on these findings I recommended continuing Zytiga and use different salvage therapy only if he has radiographic disease progression.  Treatment options including Taxotere chemotherapy for a PARP inhibitor could be considered.  At this time I recommended continuing Zytiga with consideration for different salvage therapy he develops a rapid rise in his PSA or clinical signs of progression.  2.  Compression fracture tween T10 and T12: He is status post kyphoplasty without any further issues.   3.  Bone directed therapy: He is to continue calcium and vitamin D supplements.  Consideration for Prolia or Delton See were reiterated again today.  Dental  clearance has been completed and will receive Prolia which will be scheduled next week.  4.  Pain: Currently on oxycodone with pain is manageable.  This will be refilled for him.  5.  Prognosis and goals of care: Therapy remains palliative although aggressive measures are warranted at this time.   6.  Constipation: Manageable at this time with the current regimen.   7.  Follow-up: In 3 months for repeat evaluation.  30  minutes were spent on this visit.  The time was dedicated to reviewing laboratory data, imaging studies discussing treatment options for the future as well as complication related to his cancer and cancer therapy.      Zola Button, MD 5/17/20228:50 AM

## 2020-12-12 ENCOUNTER — Encounter: Payer: Self-pay | Admitting: Oncology

## 2020-12-13 LAB — GUARDANT 360

## 2020-12-18 ENCOUNTER — Other Ambulatory Visit: Payer: Self-pay | Admitting: Pulmonary Disease

## 2020-12-20 ENCOUNTER — Inpatient Hospital Stay: Payer: Medicare Other

## 2020-12-20 ENCOUNTER — Telehealth: Payer: Self-pay | Admitting: Oncology

## 2020-12-20 NOTE — Telephone Encounter (Signed)
Rescheduled appointment per 05/27 sch msg. Patient is aware.

## 2020-12-21 DIAGNOSIS — R21 Rash and other nonspecific skin eruption: Secondary | ICD-10-CM | POA: Diagnosis not present

## 2020-12-24 ENCOUNTER — Telehealth: Payer: Self-pay | Admitting: Oncology

## 2020-12-24 NOTE — Telephone Encounter (Signed)
R/s appt per 5/31 sch msg. Pt aware.

## 2020-12-25 ENCOUNTER — Other Ambulatory Visit: Payer: Self-pay

## 2020-12-25 ENCOUNTER — Ambulatory Visit: Payer: Medicare Other | Admitting: Pulmonary Disease

## 2020-12-25 ENCOUNTER — Encounter: Payer: Self-pay | Admitting: Pulmonary Disease

## 2020-12-25 ENCOUNTER — Inpatient Hospital Stay: Payer: Medicare Other | Attending: Oncology

## 2020-12-25 VITALS — BP 149/85 | HR 70

## 2020-12-25 VITALS — BP 124/80 | HR 89 | Temp 97.5°F | Ht 68.0 in | Wt 224.0 lb

## 2020-12-25 DIAGNOSIS — Z6834 Body mass index (BMI) 34.0-34.9, adult: Secondary | ICD-10-CM

## 2020-12-25 DIAGNOSIS — J4520 Mild intermittent asthma, uncomplicated: Secondary | ICD-10-CM

## 2020-12-25 DIAGNOSIS — R0600 Dyspnea, unspecified: Secondary | ICD-10-CM

## 2020-12-25 DIAGNOSIS — J449 Chronic obstructive pulmonary disease, unspecified: Secondary | ICD-10-CM | POA: Diagnosis not present

## 2020-12-25 DIAGNOSIS — J4521 Mild intermittent asthma with (acute) exacerbation: Secondary | ICD-10-CM

## 2020-12-25 DIAGNOSIS — C7951 Secondary malignant neoplasm of bone: Secondary | ICD-10-CM | POA: Diagnosis not present

## 2020-12-25 DIAGNOSIS — C61 Malignant neoplasm of prostate: Secondary | ICD-10-CM | POA: Insufficient documentation

## 2020-12-25 MED ORDER — DENOSUMAB 60 MG/ML ~~LOC~~ SOSY
60.0000 mg | PREFILLED_SYRINGE | Freq: Once | SUBCUTANEOUS | Status: AC
  Administered 2020-12-25: 60 mg via SUBCUTANEOUS

## 2020-12-25 MED ORDER — DENOSUMAB 60 MG/ML ~~LOC~~ SOSY
PREFILLED_SYRINGE | SUBCUTANEOUS | Status: AC
  Filled 2020-12-25: qty 1

## 2020-12-25 MED ORDER — ALBUTEROL SULFATE (2.5 MG/3ML) 0.083% IN NEBU: 2.5000 mg | INHALATION_SOLUTION | RESPIRATORY_TRACT | 1 refills | Status: DC | PRN

## 2020-12-25 MED ORDER — TRELEGY ELLIPTA 100-62.5-25 MCG/INH IN AEPB: INHALATION_SPRAY | RESPIRATORY_TRACT | 11 refills | Status: DC

## 2020-12-25 MED ORDER — ALBUTEROL SULFATE HFA 108 (90 BASE) MCG/ACT IN AERS: 2.0000 | INHALATION_SPRAY | Freq: Four times a day (QID) | RESPIRATORY_TRACT | 5 refills | Status: DC | PRN

## 2020-12-25 NOTE — Progress Notes (Signed)
Per Dr. Alen Blew, ok to proceed with prolia injection today with labs from 12/04/2020.

## 2020-12-25 NOTE — Patient Instructions (Addendum)
Thank you for visiting Dr. Valeta Harms at [facility]. Today we recommend the following:  Meds ordered this encounter  Medications  . albuterol (PROVENTIL) (2.5 MG/3ML) 0.083% nebulizer solution    Sig: Take 3 mLs (2.5 mg total) by nebulization every 4 (four) hours as needed for wheezing or shortness of breath.    Dispense:  360 mL    Refill:  1  . albuterol (VENTOLIN HFA) 108 (90 Base) MCG/ACT inhaler    Sig: Inhale 2 puffs into the lungs every 6 (six) hours as needed for wheezing. 2 PUFFS EVERY 6 HOURS AS NEEDED FOR WHEEZING    Dispense:  8.5 g    Refill:  5  . Fluticasone-Umeclidin-Vilant (TRELEGY ELLIPTA) 100-62.5-25 MCG/INH AEPB    Sig: INHALE 1 PUFF INTO LUNGS DAILY    Dispense:  60 each    Refill:  11   Return in about 1 year (around 12/25/2021) for with APP or Dr. Valeta Harms.    Please do your part to reduce the spread of COVID-19.

## 2020-12-25 NOTE — Progress Notes (Signed)
Synopsis: Follow up from hospitalization Feb 2021, PCP: [name], MD  Subjective:   PATIENT ID: [name] Holder GENDER: male DOB: 09-28-1955, MRN: [id number]  Chief Complaint  Patient presents with  . Follow-up    No complaints    PMH Asthma, Former smoker, quit 5 years, smoked on and off since college, heaviest 1 pack per day, menthol newports. H/o of prostate cancer, and tumor removal, bladder cancer. On prednisone $RemoveBefor'5mg'[id number]$  daily, leupron and abaraterone.  Patient was initially seen by myself in consultation on 08/16/2019 in the [facility] emergency department.  He is admitted to the hospital for hallucinations and found to be hyponatremic.  He has a history of prostate cancer on chronic prednisone.  His CT scan on admission had a right middle lobe nodular opacity that was matched on chest x-ray imaging concerning for community-acquired pneumonia.  He was treated by outpatient provider with Bactrim.  He is a former smoker quit 5 years ago as stated above.  Has a history of asthma as a child.  Currently managed with duo nebs.  Ultimately the decision was made that the patient be brought to the intensive care unit placed on 3% hypertonic saline.  He had improvement of his mentation slowly.  There was also concern for possible cross-reactivity from taking extra doses of his opiate pain medication.  OV 08/31/2019: Patient here today for follow-up regarding the abnormalities on his CAT scan imaging as well as history of asthma possible COPD from his longstanding history of smoking.  Overall doing great in comparison to where he was at his last evaluation.  He does have shortness of breath with exertion.  He does present today in a wheelchair.  Wife is with him.  OV 11/08/2019: Patient here for follow-up after pulmonary function test.  PFTs completed prior to office visit.  Pulmonary function test completed with a reduced FEV1 and FVC at 69% predicted, no significant bronchodilator response RV with  145% predicted, DLCO 80% predicted.  Patient has been seen by endocrinology started on salt tablet replacement.  He has a CT scan planned for follow-up with his right middle lobe consolidation which I believe to be community-acquired pneumonia related to his hospitalization however we need to ensure resolution of this.  He is currently managed with Symbicort.  Was recently changed to a new prostate cancer medication.  Overall doing well.  OV 12/25/2020: Here today for follow-up regarding asthma COPD overlap.  Former smoker, asthma since childhood.  Currently maintained on triple therapy inhaler.  Doing well from that standpoint.  Is obese BMI 34 has been trying to lose some weight.  Had a recently elevated PSA.  Thought to potentially have a reaction, reaction to Lupron injection.  Also on a testosterone inhibitor for his prostate cancer.  Had a recent CT abdomen pelvis with no evidence of metastatic disease.  Respiratory standpoint doing well.    Past Medical History:  Diagnosis Date  . Allergic rhinitis   . At risk for sleep apnea    STOP-BANG SCORE = 5    (routed to pt's pcp 09-08-2018)  . Bladder cancer ([place]) dx'd 2020  . Chronic constipation   . Chronic low back pain   . Chronic pain   . DOE (dyspnea on exertion)   . History of kidney stones   . History of recent pneumonia 08/10/2018   acute bronchopneumonia;  follow-up cxr 08-28-2018 in epic  . History of vertebral compression fracture    T10 and T12 s/p  kyphoplasty  . Hx of colonic polyps   . Hyperplasia of prostate with lower urinary tract symptoms (LUTS)   . Hypertension   . Mild persistent asthma    followed by pcp  . Numbness in both legs    secondary to a fall, per pt has had work-up and couldn't find anything  . Osteopenia   . Prostate cancer metastatic to pelvis [facility]) urologist-- dr borden/  oncologist -- dr Alen Blew   dx 2014 advanced prostate cancer with pelvid adenopathy,  Stage T2c, Gleason 7;  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   . Wears glasses      Family History  Problem Relation Age of Onset  . Arthritis Mother   . Diabetes Mother   . Hypertension Mother   . Hyperlipidemia Mother   . Hypertension Father   . Prostate cancer Father        radiation in mid 85s  . Heart attack Father   . Cancer Sister        precancerous colon polyps, 6 in colon removed  . Stroke Maternal Grandfather   . Other Neg Hx        hyponatremia     Past Surgical History:  Procedure Laterality Date  . COLONOSCOPY    . IR KYPHO EA ADDL LEVEL THORACIC OR LUMBAR  08/17/2018  . IR KYPHO EA ADDL LEVEL THORACIC OR LUMBAR  08/26/2018  . IR KYPHO LUMBAR INC FX REDUCE BONE BX UNI/BIL CANNULATION INC/IMAGING  08/26/2018  . IR KYPHO THORACIC WITH BONE BIOPSY  08/17/2018  . IR RADIOLOGIST EVAL & MGMT  08/11/2018  . IR RADIOLOGIST EVAL & MGMT  08/24/2018  . LYMPHADENECTOMY Left 01/05/2013   Procedure: ROBOTIC LYMPHADENECTOMY;  Surgeon: Dutch Gray, MD;  Location: [facility];  Service: Urology;  Laterality: Left;  . PROSTATE BIOPSY     11/12 positive biopsies  . ROTATOR CUFF REPAIR Left 2012  . TRANSURETHRAL RESECTION OF BLADDER TUMOR N/A 09/15/2018   Procedure: TRANSURETHRAL RESECTION OF BLADDER TUMOR (TURBT)WITH CYSTOSCOPY/ POST OPERATIVE INSTILLATION OF GEMCITABINE;  Surgeon: Raynelle Bring, MD;  Location: [facility];  Service: Urology;  Laterality: N/A;  GENERAL ANESTHESIA WITH PARALYSIS  . UMBILICAL HERNIA REPAIR  09-02-2001   dr t. price $Remove'@[email or web address]'[id number]$     Social History   Socioeconomic History  . Marital status: Married    Spouse name: Not on file  . Number of children: Not on file  . Years of education: 42  . Highest education level: Not on file  Occupational History  . 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 Smoking status: Former Smoker    Packs/day: 1.00    Years: 25.00    Pack years: 25.00    Types: Cigarettes    Quit date: 09/08/2012    Years since quitting: 8.3  . Smokeless tobacco:  Never Used  Vaping Use  . Vaping Use: Former  . Start date: 09/08/2012  . Quit date: 07/08/2018  . Devices: e-cigarette,  e-blu  Substance and Sexual Activity  . Alcohol use: Not Currently    Alcohol/week: 2.0 standard drinks    Types: 2 Glasses of wine per week  . Drug use: Never  . Sexual activity: Not on file  Other Topics Concern  . Not on file  Social History Narrative   Regular exercise-yes   Caffeine Use-yes   Social Determinants of Health   Financial Resource Strain: Not on file  Food Insecurity: Not on file  Transportation Needs: Not on file  Physical Activity: Not on file  Stress: Not on file  Social Connections: Not on file  Intimate Partner Violence: Not on file     Allergies  Allergen Reactions  . Amlodipine Swelling    edema  . Tetracycline Rash     Outpatient Medications Prior to Visit  Medication Sig Dispense Refill  . abiraterone acetate (ZYTIGA) 250 MG tablet Take 4 tablets (1,000 mg total) by mouth daily. 120 tablet 10  . acetaminophen (TYLENOL) 325 MG tablet Take 325 mg by mouth daily as needed for fever.    [name] albuterol (PROVENTIL) (2.5 MG/3ML) 0.083% nebulizer solution NEBULIZE 1 VIAL EVERY 6-8 HOURS AS NEEDED FOR WHEEZING OR SHORTNESS OF BREATH 360 mL 1  . albuterol (VENTOLIN HFA) 108 (90 Base) MCG/ACT inhaler Inhale 2 puffs into the lungs every 6 (six) hours as needed for wheezing. 2 PUFFS EVERY 6 HOURS AS NEEDED FOR WHEEZING 8.5 g 5  . Azelastine HCl 137 MCG/SPRAY SOLN Place 2 puffs into the nose in the morning and at bedtime. 90 mL 1  . b complex vitamins tablet Take 1 tablet by mouth daily.    [name] CALCIUM-VITAMIN D PO Take 1 tablet by mouth 2 (two) times daily.     . carvedilol (COREG) 3.125 MG tablet TAKE  (1)  TABLET TWICE A DAY WITH MEALS (BREAKFAST AND SUPPER) 180 tablet 1  . fluticasone (FLONASE) 50 MCG/ACT nasal spray Place 2 sprays into both nostrils daily. 48 g 1  . furosemide (LASIX) 20 MG tablet TAKE 1 TABLET ONCE A DAY APPOINTMENT  NEEDED FOR ADDITIONAL REFILLS 90 tablet 1  . Leuprolide Acetate (LUPRON IJ) Inject 1 Dose as directed every 4 (four) months. This is administered by [facility]    . Multiple Vitamins-Minerals (CENTRUM SILVER 50+MEN PO) Take 1 tablet by mouth daily.    [name] NARCAN 4 MG/0.1ML LIQD nasal spray kit Place 0.4 mg into the nose once as needed (overdose).     [name] olmesartan (BENICAR) 40 MG tablet Take 1 tablet (40 mg total) by mouth daily. 90 tablet 1  . oxyCODONE (OXY IR/ROXICODONE) 5 MG immediate release tablet TAKE 1 TABLET EVERY 4 HOURS AS NEEDED FOR SEVERE PAIN 180 tablet 0  . polyethylene glycol (MIRALAX) 17 g packet Take 17 g by mouth daily. 30 each 6  . predniSONE (DELTASONE) 5 MG tablet TAKE (1) TABLET TWICE A DAY. 90 tablet 3  . sodium chloride 1 g tablet Take 2 g by mouth 2 (two) times daily with a meal.    . solifenacin (VESICARE) 5 MG tablet Take 5 mg by mouth daily.    . TRELEGY ELLIPTA 100-62.5-25 MCG/INH AEPB INHALE 1 PUFF INTO LUNGS DAILY 60 each 5   No facility-administered medications prior to visit.    Review of Systems  Constitutional: Positive for malaise/fatigue. Negative for chills, fever and weight loss.  HENT: Negative for hearing loss, sore throat and tinnitus.   Eyes: Negative for blurred vision and double vision.  Respiratory: Negative for cough, hemoptysis, sputum production, shortness of breath, wheezing and stridor.   Cardiovascular: Negative for chest pain, palpitations, orthopnea, leg swelling and PND.  Gastrointestinal: Negative for abdominal pain, constipation, diarrhea, heartburn, nausea and vomiting.  Genitourinary: Negative for dysuria, hematuria and urgency.  Musculoskeletal: Negative for joint pain and myalgias.  Skin: Negative for itching and rash.  Neurological: Negative for dizziness, tingling, weakness and headaches.  Endo/Heme/Allergies: Negative for environmental allergies. Does not bruise/bleed easily.  Psychiatric/Behavioral: Negative for  depression. The patient is not nervous/anxious and does not have insomnia.   All other systems reviewed and are negative.    Objective:  Physical Exam Vitals reviewed.  Constitutional:      General: He is not in acute distress.    Appearance: He is well-developed. He is obese.  HENT:     Head: Normocephalic and atraumatic.  Eyes:     General: No scleral icterus.    Conjunctiva/sclera: Conjunctivae normal.     Pupils: Pupils are equal, round, and reactive to light.  Neck:     Vascular: No JVD.     Trachea: No tracheal deviation.  Cardiovascular:     Rate and Rhythm: Normal rate and regular rhythm.     Heart sounds: Normal heart sounds. No murmur heard.   Pulmonary:     Effort: Pulmonary effort is normal. No tachypnea, accessory muscle usage or respiratory distress.     Breath sounds: No stridor. No wheezing, rhonchi or rales.  Abdominal:     General: Bowel sounds are normal. There is no distension.     Palpations: Abdomen is soft.     Tenderness: There is no abdominal tenderness.  Musculoskeletal:        General: No tenderness.     Cervical back: Neck supple.  Lymphadenopathy:     Cervical: No cervical adenopathy.  Skin:    General: Skin is warm and dry.     Capillary Refill: Capillary refill takes less than 2 seconds.     Findings: No rash.  Neurological:     Mental Status: He is alert and oriented to person, place, and time.  Psychiatric:        Behavior: Behavior normal.      Vitals:   12/25/20 1526  BP: 124/80  Pulse: 89  Temp: (!) 97.5 F (36.4 C)  SpO2: 98%  Weight: 224 lb (101.6 kg)  Height: 5\' 8"  (1.727 m)   98% on RA BMI Readings from Last 3 Encounters:  12/25/20 34.06 kg/m  12/10/20 35.21 kg/m  09/30/20 36.49 kg/m   Wt Readings from Last 3 Encounters:  12/25/20 224 lb (101.6 kg)  12/10/20 224 lb 12.8 oz (102 kg)  09/30/20 233 lb (105.7 kg)     CBC    Component Value Date/Time   WBC 9.2 12/04/2020 0942   WBC 9.4 08/31/2019 1354    RBC 4.28 12/04/2020 0942   HGB 13.0 12/04/2020 0942   HCT 39.0 12/04/2020 0942   PLT 270 12/04/2020 0942   MCV 91.1 12/04/2020 0942   MCH 30.4 12/04/2020 0942   MCHC 33.3 12/04/2020 0942   RDW 13.2 12/04/2020 0942   LYMPHSABS 2.3 12/04/2020 0942   MONOABS 0.7 12/04/2020 0942   EOSABS 0.2 12/04/2020 0942   BASOSABS 0.0 12/04/2020 0942    Chest Imaging: 08/09/2019 CT chest: Patient with right middle lobe nodular opacity underlying malignancy not excluded.  Likely inflammatory related.  Possible community-acquired pneumonia. The patient's images have been independently reviewed by me.    Pulmonary Functions Testing Results: PFT Results Latest Ref Rng & Units 11/08/2019 07/25/2013  FVC-Pre L 3.02 4.60  FVC-Predicted Pre % 69 101  FVC-Post L 3.07 4.61  FVC-Predicted Post % 70 101  Pre FEV1/FVC % % 75 70  Post FEV1/FCV % % 77 72  FEV1-Pre L 2.27 3.21  FEV1-Predicted Pre % 69 93  FEV1-Post L 2.35 3.31  DLCO uncorrected ml/min/mmHg 20.52 33.22  DLCO UNC% % 80 109  DLCO corrected ml/min/mmHg 20.52 -  DLCO COR %Predicted % 80 -  DLVA Predicted % 90 94  TLC L 6.52 6.29  TLC % Predicted % 97 94  RV % Predicted % 145 73       Assessment & Plan:     ICD-10-CM   1. Asthma-COPD overlap syndrome ([place])  J44.9   2. Mild intermittent asthma without complication  O96.29   3. Dyspnea, unspecified type  R06.00   4. BMI 34.0-34.9,adult  Z68.34     Discussion:  This is a 65 year old gentleman, right middle lobe nodular opacity concerning for community-acquired pneumonia treated hospitalization, former smoker quit 6 years ago.  Noncontrasted CT scan of the chest with resolution and small amount of right middle lobe and lingular atelectasis.  Pulmonary function test with mixed impaired spirometry preserved ratio.  Likely has obesity with asthma COPD overlap type symptoms.  Also known history of prostate cancer followed with medical oncology and urology.  Plan: Trelegy continued, doing well   Continue prn albuterol.  Refills given today for medications needed. Albuterol refills for solution and HFA inhaler. Encouraged ongoing weight loss and increased exercise tolerance. Can follow-up with [place] in 12 months or as needed.    Current Outpatient Medications:  .  abiraterone acetate (ZYTIGA) 250 MG tablet, Take 4 tablets (1,000 mg total) by mouth daily., Disp: 120 tablet, Rfl: 10 .  acetaminophen (TYLENOL) 325 MG tablet, Take 325 mg by mouth daily as needed for fever., Disp: , Rfl:  .  albuterol (PROVENTIL) (2.5 MG/3ML) 0.083% nebulizer solution, NEBULIZE 1 VIAL EVERY 6-8 HOURS AS NEEDED FOR WHEEZING OR SHORTNESS OF BREATH, Disp: 360 mL, Rfl: 1 .  albuterol (VENTOLIN HFA) 108 (90 Base) MCG/ACT inhaler, Inhale 2 puffs into the lungs every 6 (six) hours as needed for wheezing. 2 PUFFS EVERY 6 HOURS AS NEEDED FOR WHEEZING, Disp: 8.5 g, Rfl: 5 .  Azelastine HCl 137 MCG/SPRAY SOLN, Place 2 puffs into the nose in the morning and at bedtime., Disp: 90 mL, Rfl: 1 .  b complex vitamins tablet, Take 1 tablet by mouth daily., Disp: , Rfl:  .  CALCIUM-VITAMIN D PO, Take 1 tablet by mouth 2 (two) times daily. , Disp: , Rfl:  .  carvedilol (COREG) 3.125 MG tablet, TAKE  (1)  TABLET TWICE A DAY WITH MEALS (BREAKFAST AND SUPPER), Disp: 180 tablet, Rfl: 1 .  fluticasone (FLONASE) 50 MCG/ACT nasal spray, Place 2 sprays into both nostrils daily., Disp: 48 g, Rfl: 1 .  furosemide (LASIX) 20 MG tablet, TAKE 1 TABLET ONCE A DAY APPOINTMENT NEEDED FOR ADDITIONAL REFILLS, Disp: 90 tablet, Rfl: 1 .  Leuprolide Acetate (LUPRON IJ), Inject 1 Dose as directed every 4 (four) months. This is administered by [facility], Disp: , Rfl:  .  Multiple Vitamins-Minerals (CENTRUM SILVER 50+MEN PO), Take 1 tablet by mouth daily., Disp: , Rfl:  .  NARCAN 4 MG/0.1ML LIQD nasal spray kit, Place 0.4 mg into the nose once as needed (overdose). , Disp: , Rfl:  .  olmesartan (BENICAR) 40 MG tablet, Take 1 tablet (40 mg total)  by mouth daily., Disp: 90 tablet, Rfl: 1 .  oxyCODONE (OXY IR/ROXICODONE) 5 MG immediate release tablet, TAKE 1 TABLET EVERY 4 HOURS AS NEEDED FOR SEVERE PAIN, Disp: 180 tablet, Rfl: 0 .  polyethylene glycol (MIRALAX) 17 g packet, Take 17 g by mouth daily., Disp: 30 each, Rfl: 6 .  predniSONE (DELTASONE) 5 MG tablet, TAKE (1) TABLET TWICE A DAY., Disp: 90 tablet, Rfl: 3 .  sodium  chloride 1 g tablet, Take 2 g by mouth 2 (two) times daily with a meal., Disp: , Rfl:  .  solifenacin (VESICARE) 5 MG tablet, Take 5 mg by mouth daily., Disp: , Rfl:  .  TRELEGY ELLIPTA 100-62.5-25 MCG/INH AEPB, INHALE 1 PUFF INTO LUNGS DAILY, Disp: 60 each, Rfl: 5   Garner Nash, DO [facility] 12/25/2020 3:39 PM

## 2020-12-25 NOTE — Patient Instructions (Signed)
Denosumab injection What is this medicine? DENOSUMAB (den oh sue mab) slows bone breakdown. Prolia is used to treat osteoporosis in women after menopause and in men, and in people who are taking corticosteroids for 6 months or more. Xgeva is used to treat a high calcium level due to cancer and to prevent bone fractures and other bone problems caused by multiple myeloma or cancer bone metastases. Xgeva is also used to treat giant cell tumor of the bone. This medicine may be used for other purposes; ask your health care provider or pharmacist if you have questions. COMMON BRAND NAME(S): Prolia, XGEVA What should I tell my health care provider before I take this medicine? They need to know if you have any of these conditions:  dental disease  having surgery or tooth extraction  infection  kidney disease  low levels of calcium or Vitamin D in the blood  malnutrition  on hemodialysis  skin conditions or sensitivity  thyroid or parathyroid disease  an unusual reaction to denosumab, other medicines, foods, dyes, or preservatives  pregnant or trying to get pregnant  breast-feeding How should I use this medicine? This medicine is for injection under the skin. It is given by a health care professional in a hospital or clinic setting. A special MedGuide will be given to you before each treatment. Be sure to read this information carefully each time. For Prolia, talk to your pediatrician regarding the use of this medicine in children. Special care may be needed. For Xgeva, talk to your pediatrician regarding the use of this medicine in children. While this drug may be prescribed for children as young as 13 years for selected conditions, precautions do apply. Overdosage: If you think you have taken too much of this medicine contact a poison control center or emergency room at once. NOTE: This medicine is only for you. Do not share this medicine with others. What if I miss a dose? It is  important not to miss your dose. Call your doctor or health care professional if you are unable to keep an appointment. What may interact with this medicine? Do not take this medicine with any of the following medications:  other medicines containing denosumab This medicine may also interact with the following medications:  medicines that lower your chance of fighting infection  steroid medicines like prednisone or cortisone This list may not describe all possible interactions. Give your health care provider a list of all the medicines, herbs, non-prescription drugs, or dietary supplements you use. Also tell them if you smoke, drink alcohol, or use illegal drugs. Some items may interact with your medicine. What should I watch for while using this medicine? Visit your doctor or health care professional for regular checks on your progress. Your doctor or health care professional may order blood tests and other tests to see how you are doing. Call your doctor or health care professional for advice if you get a fever, chills or sore throat, or other symptoms of a cold or flu. Do not treat yourself. This drug may decrease your body's ability to fight infection. Try to avoid being around people who are sick. You should make sure you get enough calcium and vitamin D while you are taking this medicine, unless your doctor tells you not to. Discuss the foods you eat and the vitamins you take with your health care professional. See your dentist regularly. Brush and floss your teeth as directed. Before you have any dental work done, tell your dentist you are   receiving this medicine. Do not become pregnant while taking this medicine or for 5 months after stopping it. Talk with your doctor or health care professional about your birth control options while taking this medicine. Women should inform their doctor if they wish to become pregnant or think they might be pregnant. There is a potential for serious side  effects to an unborn child. Talk to your health care professional or pharmacist for more information. What side effects may I notice from receiving this medicine? Side effects that you should report to your doctor or health care professional as soon as possible:  allergic reactions like skin rash, itching or hives, swelling of the face, lips, or tongue  bone pain  breathing problems  dizziness  jaw pain, especially after dental work  redness, blistering, peeling of the skin  signs and symptoms of infection like fever or chills; cough; sore throat; pain or trouble passing urine  signs of low calcium like fast heartbeat, muscle cramps or muscle pain; pain, tingling, numbness in the hands or feet; seizures  unusual bleeding or bruising  unusually weak or tired Side effects that usually do not require medical attention (report to your doctor or health care professional if they continue or are bothersome):  constipation  diarrhea  headache  joint pain  loss of appetite  muscle pain  runny nose  tiredness  upset stomach This list may not describe all possible side effects. Call your doctor for medical advice about side effects. You may report side effects to FDA at 1-800-FDA-1088. Where should I keep my medicine? This medicine is only given in a clinic, doctor's office, or other health care setting and will not be stored at home. NOTE: This sheet is a summary. It may not cover all possible information. If you have questions about this medicine, talk to your doctor, pharmacist, or health care provider.  2021 Elsevier/Gold Standard (2017-11-19 16:10:44)

## 2020-12-27 ENCOUNTER — Ambulatory Visit: Payer: Medicare Other

## 2021-01-23 DIAGNOSIS — Z8551 Personal history of malignant neoplasm of bladder: Secondary | ICD-10-CM | POA: Diagnosis not present

## 2021-01-23 DIAGNOSIS — R351 Nocturia: Secondary | ICD-10-CM | POA: Diagnosis not present

## 2021-02-17 ENCOUNTER — Other Ambulatory Visit: Payer: Self-pay | Admitting: Oncology

## 2021-02-18 ENCOUNTER — Encounter: Payer: Self-pay | Admitting: Oncology

## 2021-03-01 ENCOUNTER — Other Ambulatory Visit: Payer: Self-pay | Admitting: Internal Medicine

## 2021-03-01 DIAGNOSIS — I10 Essential (primary) hypertension: Secondary | ICD-10-CM

## 2021-03-12 ENCOUNTER — Inpatient Hospital Stay: Payer: Medicare Other | Attending: Oncology | Admitting: Oncology

## 2021-03-12 ENCOUNTER — Other Ambulatory Visit: Payer: Self-pay

## 2021-03-12 ENCOUNTER — Inpatient Hospital Stay: Payer: Medicare Other

## 2021-03-12 VITALS — BP 148/86 | HR 80 | Temp 97.8°F | Resp 17 | Ht 68.0 in | Wt 221.4 lb

## 2021-03-12 DIAGNOSIS — C7951 Secondary malignant neoplasm of bone: Secondary | ICD-10-CM | POA: Diagnosis not present

## 2021-03-12 DIAGNOSIS — C61 Malignant neoplasm of prostate: Secondary | ICD-10-CM | POA: Diagnosis not present

## 2021-03-12 DIAGNOSIS — Z5111 Encounter for antineoplastic chemotherapy: Secondary | ICD-10-CM | POA: Diagnosis not present

## 2021-03-12 DIAGNOSIS — Z79899 Other long term (current) drug therapy: Secondary | ICD-10-CM | POA: Insufficient documentation

## 2021-03-12 DIAGNOSIS — Z8546 Personal history of malignant neoplasm of prostate: Secondary | ICD-10-CM

## 2021-03-12 DIAGNOSIS — C7952 Secondary malignant neoplasm of bone marrow: Secondary | ICD-10-CM | POA: Diagnosis not present

## 2021-03-12 LAB — CBC WITH DIFFERENTIAL (CANCER CENTER ONLY)
Abs Immature Granulocytes: 0.07 10*3/uL (ref 0.00–0.07)
Basophils Absolute: 0.1 10*3/uL (ref 0.0–0.1)
Basophils Relative: 1 %
Eosinophils Absolute: 0.2 10*3/uL (ref 0.0–0.5)
Eosinophils Relative: 2 %
HCT: 37 % — ABNORMAL LOW (ref 39.0–52.0)
Hemoglobin: 12.4 g/dL — ABNORMAL LOW (ref 13.0–17.0)
Immature Granulocytes: 1 %
Lymphocytes Relative: 25 %
Lymphs Abs: 2.8 10*3/uL (ref 0.7–4.0)
MCH: 29.7 pg (ref 26.0–34.0)
MCHC: 33.5 g/dL (ref 30.0–36.0)
MCV: 88.5 fL (ref 80.0–100.0)
Monocytes Absolute: 0.8 10*3/uL (ref 0.1–1.0)
Monocytes Relative: 7 %
Neutro Abs: 7.6 10*3/uL (ref 1.7–7.7)
Neutrophils Relative %: 64 %
Platelet Count: 280 10*3/uL (ref 150–400)
RBC: 4.18 MIL/uL — ABNORMAL LOW (ref 4.22–5.81)
RDW: 12.9 % (ref 11.5–15.5)
WBC Count: 11.5 10*3/uL — ABNORMAL HIGH (ref 4.0–10.5)
nRBC: 0 % (ref 0.0–0.2)

## 2021-03-12 LAB — CMP (CANCER CENTER ONLY)
ALT: 19 U/L (ref 0–44)
AST: 11 U/L — ABNORMAL LOW (ref 15–41)
Albumin: 3.7 g/dL (ref 3.5–5.0)
Alkaline Phosphatase: 56 U/L (ref 38–126)
Anion gap: 9 (ref 5–15)
BUN: 17 mg/dL (ref 8–23)
CO2: 23 mmol/L (ref 22–32)
Calcium: 9.3 mg/dL (ref 8.9–10.3)
Chloride: 107 mmol/L (ref 98–111)
Creatinine: 0.93 mg/dL (ref 0.61–1.24)
GFR, Estimated: >60 mL/min (ref >60)
Glucose, Bld: 112 mg/dL — ABNORMAL HIGH (ref 70–99)
Potassium: 3.9 mmol/L (ref 3.5–5.1)
Sodium: 139 mmol/L (ref 135–145)
Total Bilirubin: 0.5 mg/dL (ref 0.3–1.2)
Total Protein: 6.6 g/dL (ref 6.5–8.1)

## 2021-03-12 LAB — PSA: PSA: 0.4

## 2021-03-12 MED ORDER — LEUPROLIDE ACETATE (3 MONTH) 22.5 MG ~~LOC~~ KIT
22.5000 mg | PACK | Freq: Once | SUBCUTANEOUS | Status: AC
  Administered 2021-03-12: 22.5 mg via SUBCUTANEOUS
  Filled 2021-03-12: qty 22.5

## 2021-03-12 NOTE — Progress Notes (Signed)
Hematology and Oncology Follow Up Visit  [name] Holder [id number] Dec 15, 1955 65 y.o. 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  He had presented with advanced disease in 2014.  Prior Therapy:  Androgen deprivation therapy in 2014.  He developed castration resistant in 2018.  He is status post T10, T12, kyphoplasty completed on January 22 of 2020.  Current therapy:  Zytiga 1000 mg daily with prednisone at 5 mg daily started in 2018.  Eligard every 3 months.  Next injection will be given today.  Interim History: Mr. [name] presents today for return evaluation.  Since the last visit, he reports no major changes in his health.  He continues to tolerate Zytiga without any major complications.  He denies any nausea, fatigue or worsening edema.  He denies worsening bone pain or pathological fractures.  As performance status quality of life remained stable without any decline.          Medications: Unchanged on review. Current Outpatient Medications  Medication Sig Dispense Refill   abiraterone acetate (ZYTIGA) 250 MG tablet Take 4 tablets (1,000 mg total) by mouth daily. 120 tablet 10   acetaminophen (TYLENOL) 325 MG tablet Take 325 mg by mouth daily as needed for fever.     albuterol (PROVENTIL) (2.5 MG/3ML) 0.083% nebulizer solution Take 3 mLs (2.5 mg total) by nebulization every 4 (four) hours as needed for wheezing or shortness of breath. 360 mL 1   albuterol (VENTOLIN HFA) 108 (90 Base) MCG/ACT inhaler Inhale 2 puffs into the lungs every 6 (six) hours as needed for wheezing. 2 PUFFS EVERY 6 HOURS AS NEEDED FOR WHEEZING 8.5 g 5   Azelastine HCl 137 MCG/SPRAY SOLN Place 2 puffs into the nose in the morning and at bedtime. 90 mL 1   b complex vitamins tablet Take 1 tablet by mouth daily.     CALCIUM-VITAMIN D PO Take 1  tablet by mouth 2 (two) times daily.      carvedilol (COREG) 3.125 MG tablet TAKE (1) TABLET TWICE A DAY WITH MEALS (BREAKFAST AND SUPPER) 180 tablet 0   fluticasone (FLONASE) 50 MCG/ACT nasal spray Place 2 sprays into both nostrils daily. 48 g 1   Fluticasone-Umeclidin-Vilant (TRELEGY ELLIPTA) 100-62.5-25 MCG/INH AEPB INHALE 1 PUFF INTO LUNGS DAILY 60 each 11   furosemide (LASIX) 20 MG tablet TAKE 1 TABLET ONCE A DAY APPOINTMENT NEEDED FOR ADDITIONAL REFILLS 90 tablet 1   Leuprolide Acetate (LUPRON IJ) Inject 1 Dose as directed every 4 (four) months. This is administered by [facility]     Multiple Vitamins-Minerals (CENTRUM SILVER 50+MEN PO) Take 1 tablet by mouth daily.     NARCAN 4 MG/0.1ML LIQD nasal spray kit Place 0.4 mg into the nose once as needed (overdose).      olmesartan (BENICAR) 40 MG tablet Take 1 tablet (40 mg total) by mouth daily. 90 tablet 1   oxyCODONE (OXY IR/ROXICODONE) 5 MG immediate release tablet TAKE 1 TABLET EVERY 4 HOURS AS NEEDED FOR SEVERE PAIN 180 tablet 0   polyethylene glycol (MIRALAX) 17 g packet Take 17 g by mouth daily. 30 each 6   predniSONE (DELTASONE) 5 MG tablet TAKE (1) TABLET TWICE A DAY. 90 tablet 3   sodium chloride 1 g tablet Take 2 g by mouth 2 (two) times daily with a meal.     solifenacin (VESICARE) 5 MG tablet Take 5  mg by mouth daily.     No current facility-administered medications for this visit.     Allergies:  Allergies  Allergen Reactions   Amlodipine Swelling    edema   Tetracycline Rash      Physical Exam:    Blood pressure (!) 148/86, pulse 80, temperature 97.8 F (36.6 C), temperature source Oral, resp. rate 17, height $RemoveBe'5\' 8"'fpfXcHtLq$  (1.727 m), weight 221 lb 6.4 oz (100.4 kg), SpO2 100 %.     ECOG: 1    General appearance: Comfortable appearing without any discomfort Head: Normocephalic without any trauma Oropharynx: Mucous membranes are moist and pink without any thrush or ulcers. Eyes: Pupils are equal and round  reactive to light. Lymph nodes: No cervical, supraclavicular, inguinal or axillary lymphadenopathy.   Heart:regular rate and rhythm.  S1 and S2 without leg edema. Lung: Clear without any rhonchi or wheezes.  No dullness to percussion. Abdomin: Soft, nontender, nondistended with good bowel sounds.  No hepatosplenomegaly. Musculoskeletal: No joint deformity or effusion.  Full range of motion noted. Neurological: No deficits noted on motor, sensory and deep tendon reflex exam. Skin: No petechial rash or dryness.  Appeared moist.            Lab Results: Lab Results  Component Value Date   WBC 9.2 12/04/2020   HGB 13.0 12/04/2020   HCT 39.0 12/04/2020   MCV 91.1 12/04/2020   PLT 270 12/04/2020     Chemistry      Component Value Date/Time   NA 139 12/04/2020 0942   K 4.3 12/04/2020 0942   CL 105 12/04/2020 0942   CO2 25 12/04/2020 0942   BUN 19 12/04/2020 0942   CREATININE 0.81 12/04/2020 0942      Component Value Date/Time   CALCIUM 9.7 12/04/2020 0942   ALKPHOS 63 12/04/2020 0942   AST 14 (L) 12/04/2020 0942   ALT 14 12/04/2020 0942   BILITOT 0.4 12/04/2020 0942        Results for [name], Holder (MRN [id number]) as of 12/10/2020 08:51  Ref. Range 09/04/2020 10:03 12/04/2020 09:42  Prostate Specific Ag, Serum Latest Ref Range: 0.0 - 4.0 ng/mL 0.6 0.7      Impression and Plan:  65 year old man with:   1.    Advanced prostate cancer with lymphadenopathy diagnosed in 2014.  He has castration-resistant since 2018.   He continues to be on Zytiga with excellent overall clinical benefit and the slow rise in his PSA.  Staging work-up in May 2022 showed overall stable disease without any signs of progression.  Risks and benefits of continuing this treatment were discussed at this time.  Alternative treatment options including systemic chemotherapy among others were reiterated.  At this time I will continue the same dose and schedule.  2.  Compression fracture tween  T10 and T12: No recent exacerbation noted at this time.    3.  Bone directed therapy: He is currently on Prolia which will receive every 6 months.  Next injection will be in December 2022.  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   4.  Pain: Related to malignancy and compression fracture.  Oxycodone is available to him and has been scheduling around-the-clock every 6-8 hours.  His pain is manageable.   5.  Prognosis and goals of care: His disease is incurable although aggressive measures are warranted given his reasonable performance status.      6.  Follow-up: He will return in 3 months for a follow-up visit.  30  minutes were  dedicated to this encounter.  The time spent on reviewing laboratory data, disease status update, treatment choices discussion and future plan of care review.         Zola Button, MD 8/17/20229:58 AM

## 2021-03-13 ENCOUNTER — Telehealth: Payer: Self-pay

## 2021-03-13 LAB — PROSTATE-SPECIFIC AG, SERUM (LABCORP): Prostate Specific Ag, Serum: 0.4 ng/mL (ref 0.0–4.0)

## 2021-03-13 NOTE — Telephone Encounter (Signed)
Contacted patient and made aware of PSA. Patient appreciative and had no other questions or concerns.

## 2021-03-13 NOTE — Telephone Encounter (Signed)
-----   Message from Wyatt Portela, MD sent at 03/13/2021  1:51 PM EDT ----- Please let him know his PSA is still low

## 2021-04-01 ENCOUNTER — Ambulatory Visit: Payer: Medicare Other | Admitting: Internal Medicine

## 2021-04-07 ENCOUNTER — Other Ambulatory Visit: Payer: Self-pay | Admitting: Oncology

## 2021-04-07 ENCOUNTER — Other Ambulatory Visit: Payer: Self-pay | Admitting: Internal Medicine

## 2021-04-07 DIAGNOSIS — I10 Essential (primary) hypertension: Secondary | ICD-10-CM

## 2021-04-21 ENCOUNTER — Ambulatory Visit: Payer: Medicare Other | Admitting: Internal Medicine

## 2021-04-24 DIAGNOSIS — E222 Syndrome of inappropriate secretion of antidiuretic hormone: Secondary | ICD-10-CM | POA: Diagnosis not present

## 2021-04-24 DIAGNOSIS — Z86010 Personal history of colonic polyps: Secondary | ICD-10-CM | POA: Diagnosis not present

## 2021-04-24 DIAGNOSIS — K219 Gastro-esophageal reflux disease without esophagitis: Secondary | ICD-10-CM | POA: Diagnosis not present

## 2021-04-24 DIAGNOSIS — R031 Nonspecific low blood-pressure reading: Secondary | ICD-10-CM | POA: Diagnosis not present

## 2021-04-29 ENCOUNTER — Other Ambulatory Visit: Payer: Self-pay

## 2021-04-29 ENCOUNTER — Encounter: Payer: Self-pay | Admitting: Internal Medicine

## 2021-04-29 ENCOUNTER — Ambulatory Visit (INDEPENDENT_AMBULATORY_CARE_PROVIDER_SITE_OTHER): Payer: Medicare Other | Admitting: Internal Medicine

## 2021-04-29 DIAGNOSIS — I10 Essential (primary) hypertension: Secondary | ICD-10-CM | POA: Diagnosis not present

## 2021-04-29 MED ORDER — CARVEDILOL 3.125 MG PO TABS: ORAL_TABLET | ORAL | 1 refills | Status: DC

## 2021-04-29 MED ORDER — OLMESARTAN MEDOXOMIL 40 MG PO TABS: 40.0000 mg | ORAL_TABLET | Freq: Every day | ORAL | 1 refills | Status: DC

## 2021-04-29 MED ORDER — FUROSEMIDE 20 MG PO TABS: 20.0000 mg | ORAL_TABLET | Freq: Every day | ORAL | 1 refills | Status: DC

## 2021-04-29 NOTE — Patient Instructions (Signed)

## 2021-04-29 NOTE — Progress Notes (Signed)
Subjective:  Patient ID: [name] Holder, male    DOB: [birth year]  Age: 65 y.o. MRN: [id number]  CC: Hypertension  This visit occurred during the SARS-CoV-2 public health emergency.  Safety protocols were in place, including screening questions prior to the visit, additional usage of staff PPE, and extensive cleaning of exam room while observing appropriate contact time as indicated for disinfecting solutions.    HPI SHEAN GERDING presents for f/up -   He tells me that his BP is well controlled. He denies CP, DOE, diaphoresis or edema.  Outpatient Medications Prior to Visit  Medication Sig Dispense Refill   abiraterone acetate (ZYTIGA) 250 MG tablet Take 4 tablets (1,000 mg total) by mouth daily. 120 tablet 10   acetaminophen (TYLENOL) 325 MG tablet Take 325 mg by mouth daily as needed for fever.     albuterol (PROVENTIL) (2.5 MG/3ML) 0.083% nebulizer solution Take 3 mLs (2.5 mg total) by nebulization every 4 (four) hours as needed for wheezing or shortness of breath. 360 mL 1   albuterol (VENTOLIN HFA) 108 (90 Base) MCG/ACT inhaler Inhale 2 puffs into the lungs every 6 (six) hours as needed for wheezing. 2 PUFFS EVERY 6 HOURS AS NEEDED FOR WHEEZING 8.5 g 5   Azelastine HCl 137 MCG/SPRAY SOLN Place 2 puffs into the nose in the morning and at bedtime. 90 mL 1   CALCIUM-VITAMIN D PO Take 1 tablet by mouth 2 (two) times daily.      denosumab (PROLIA) 60 MG/ML SOSY injection Inject 60 mg into the skin every 6 (six) months.     fluticasone (FLONASE) 50 MCG/ACT nasal spray Place 2 sprays into both nostrils daily. 48 g 1   Fluticasone-Umeclidin-Vilant (TRELEGY ELLIPTA) 100-62.5-25 MCG/INH AEPB INHALE 1 PUFF INTO LUNGS DAILY 60 each 11   Leuprolide Acetate (LUPRON IJ) Inject 1 Dose as directed every 4 (four) months. This is administered by [facility]     Multiple Vitamins-Minerals (CENTRUM SILVER 50+MEN PO) Take 1 tablet by mouth daily.     NARCAN 4 MG/0.1ML LIQD nasal spray kit Place  0.4 mg into the nose once as needed (overdose).      oxyCODONE (OXY IR/ROXICODONE) 5 MG immediate release tablet TAKE 1 TABLET EVERY 4 HOURS AS NEEDED FOR SEVERE PAIN 180 tablet 0   polyethylene glycol (MIRALAX) 17 g packet Take 17 g by mouth daily. 30 each 6   predniSONE (DELTASONE) 5 MG tablet TAKE (1) TABLET TWICE A DAY. 90 tablet 3   sodium chloride 1 g tablet Take 2 g by mouth 2 (two) times daily with a meal.     solifenacin (VESICARE) 5 MG tablet Take 5 mg by mouth daily.     carvedilol (COREG) 3.125 MG tablet TAKE (1) TABLET TWICE A DAY WITH MEALS (BREAKFAST AND SUPPER) 180 tablet 0   furosemide (LASIX) 20 MG tablet TAKE ONE TABLET ONCE DAILY 90 tablet 0   olmesartan (BENICAR) 40 MG tablet Take 1 tablet daily. 90 tablet 0   No facility-administered medications prior to visit.    ROS Review of Systems  Constitutional:  Negative for diaphoresis and fatigue.  HENT: Negative.    Eyes: Negative.   Respiratory:  Negative for cough, chest tightness and shortness of breath.   Cardiovascular:  Negative for chest pain, palpitations and leg swelling.  Gastrointestinal:  Positive for constipation and diarrhea. Negative for abdominal pain and vomiting.  Endocrine: Negative.   Genitourinary: Negative.  Negative for difficulty urinating and dysuria.  Musculoskeletal:  Positive for back pain.  Skin: Negative.  Negative for color change.  Neurological:  Negative for dizziness, weakness, light-headedness and headaches.  Hematological:  Negative for adenopathy. Does not bruise/bleed easily.  Psychiatric/Behavioral: Negative.     Objective:  BP 126/80 (BP Location: Right Arm, Patient Position: Sitting, Cuff Size: Large)   Pulse 75   Temp 98.7 F (37.1 C) (Oral)   Ht $R'5\' 8"'Td$  (1.727 m)   Wt 222 lb (100.7 kg)   SpO2 98%   BMI 33.75 kg/m   BP Readings from Last 3 Encounters:  04/29/21 126/80  03/12/21 (!) 148/86  12/25/20 124/80    Wt Readings from Last 3 Encounters:  04/29/21 222 lb  (100.7 kg)  03/12/21 221 lb 6.4 oz (100.4 kg)  12/25/20 224 lb (101.6 kg)    Physical Exam Vitals reviewed.  HENT:     Nose: Nose normal.     Mouth/Throat:     Mouth: Mucous membranes are moist.  Eyes:     Conjunctiva/sclera: Conjunctivae normal.  Cardiovascular:     Rate and Rhythm: Normal rate and regular rhythm.     Heart sounds: No murmur heard. Pulmonary:     Effort: Pulmonary effort is normal.     Breath sounds: No stridor. No wheezing, rhonchi or rales.  Abdominal:     General: Abdomen is protuberant. Bowel sounds are normal. There is no distension.     Palpations: Abdomen is soft. There is no hepatomegaly, splenomegaly or mass.     Tenderness: There is no abdominal tenderness.  Musculoskeletal:        General: Normal range of motion.     Cervical back: Neck supple.     Right lower leg: No edema.     Left lower leg: No edema.  Skin:    General: Skin is warm and dry.  Neurological:     General: No focal deficit present.     Mental Status: He is alert.  Psychiatric:        Mood and Affect: Mood normal.    Lab Results  Component Value Date   WBC 11.5 (H) 03/12/2021   HGB 12.4 (L) 03/12/2021   HCT 37.0 (L) 03/12/2021   PLT 280 03/12/2021   GLUCOSE 112 (H) 03/12/2021   CHOL 192 09/30/2020   TRIG 190.0 (H) 09/30/2020   HDL 47.30 09/30/2020   LDLCALC 106 (H) 09/30/2020   ALT 19 03/12/2021   AST 11 (L) 03/12/2021   NA 139 03/12/2021   K 3.9 03/12/2021   CL 107 03/12/2021   CREATININE 0.93 03/12/2021   BUN 17 03/12/2021   CO2 23 03/12/2021   TSH 0.86 09/30/2020   PSA 0.40 03/12/2021   INR 0.94 08/26/2018   HGBA1C 5.2 08/31/2019    NM Bone Scan Whole Body  Result Date: 12/06/2020 CLINICAL DATA:  Prostate cancer, recent hormonal therapy EXAM: NUCLEAR MEDICINE WHOLE BODY BONE SCAN TECHNIQUE: Whole body anterior and posterior images were obtained approximately 3 hours after intravenous injection of radiopharmaceutical. RADIOPHARMACEUTICALS:  20.3 mCi  Technetium-49m MDP IV COMPARISON:  07/06/2018 Radiographic correlation: CT abdomen and pelvis 12/04/2020 FINDINGS: Uptake at shoulders, sternoclavicular joints, LEFT wrist, feet, typically degenerative. Marked decrease in degree of uptake of tracer at thoracolumbar vertebra seen on previous exam consistent with response to therapy. Markedly decreased uptake at adjacent anterior LEFT fourth and fifth ribs, likely posttraumatic. No new sites of abnormal tracer localization are identified. Expected urinary tract and soft tissue distribution of tracer. IMPRESSION: Marked decrease in degree of  tracer uptake at previously identified vertebral lesions of the thoracolumbar spine consistent with response to treatment. No new scintigraphic abnormalities identified. Electronically Signed   By: Lavonia Dana M.D.   On: 12/06/2020 08:53   CT Abdomen Pelvis W Contrast  Result Date: 12/05/2020 CLINICAL DATA:  Prostate cancer EXAM: CT ABDOMEN AND PELVIS WITH CONTRAST TECHNIQUE: Multidetector CT imaging of the abdomen and pelvis was performed using the standard protocol following bolus administration of intravenous contrast. CONTRAST:  75 cc OMNIPAQUE IOHEXOL 300 MG/ML SOLN, 47mL OMNIPAQUE IOHEXOL 300 MG/ML SOLN COMPARISON:  Abdomen/pelvis CT 09/13/2018 FINDINGS: Lower chest: Unremarkable. Hepatobiliary: No suspicious focal abnormality within the liver parenchyma. There is no evidence for gallstones, gallbladder wall thickening, or pericholecystic fluid. No intrahepatic or extrahepatic biliary dilation. Pancreas: No focal mass lesion. No dilatation of the main duct. No intraparenchymal cyst. No peripancreatic edema. Spleen: No splenomegaly. No focal mass lesion. Adrenals/Urinary Tract: No adrenal nodule or mass. Bilateral renal cysts noted measuring up to 3.1 cm in the right kidney and 3.5 cm in the left kidney. No evidence for hydroureter. The urinary bladder appears normal for the degree of distention. Stomach/Bowel: Stomach is  unremarkable. No gastric wall thickening. No evidence of outlet obstruction. Duodenum is normally positioned as is the ligament of Treitz. No small bowel wall thickening. No small bowel dilatation. The terminal ileum is normal. The appendix is normal. No gross colonic mass. No colonic wall thickening. Vascular/Lymphatic: There is abdominal aortic atherosclerosis without aneurysm. There is no gastrohepatic or hepatoduodenal ligament lymphadenopathy. No retroperitoneal or mesenteric lymphadenopathy. No pelvic sidewall lymphadenopathy. Reproductive: The prostate gland and seminal vesicles are unremarkable. Other: No intraperitoneal free fluid. Musculoskeletal: No worrisome lytic or sclerotic osseous abnormality. Multilevel thoracolumbar vertebral augmentation noted. Compression fractures at T8 and T9 are new since 07/28/2018. IMPRESSION: 1. No evidence for metastatic disease in the abdomen or pelvis. 2. Bilateral renal cysts. 3. Compression fractures at T8 and T9 are new since 07/28/2018. Multilevel thoracolumbar vertebral augmentation noted. 4. Aortic Atherosclerosis (ICD10-I70.0). Electronically Signed   By: Misty Stanley M.D.   On: 12/05/2020 12:54    Assessment & Plan:   Kiara was seen today for hypertension.  Diagnoses and all orders for this visit:  Essential hypertension- His BP is well controlled. Will cont the 3 agents. Recent lytes and renal fxn were normal. -     olmesartan (BENICAR) 40 MG tablet; Take 1 tablet (40 mg total) by mouth daily. -     carvedilol (COREG) 3.125 MG tablet; TAKE (1) TABLET TWICE A DAY WITH MEALS (BREAKFAST AND SUPPER) -     furosemide (LASIX) 20 MG tablet; Take 1 tablet (20 mg total) by mouth daily.  I have changed Linward Headland. Kipper's olmesartan and furosemide. I am also having him maintain his CALCIUM-VITAMIN D PO, Leuprolide Acetate (LUPRON IJ), Narcan, acetaminophen, Multiple Vitamins-Minerals (CENTRUM SILVER 50+MEN PO), solifenacin, sodium chloride, polyethylene  glycol, Azelastine HCl, fluticasone, abiraterone acetate, predniSONE, albuterol, albuterol, Trelegy Ellipta, oxyCODONE, denosumab, and carvedilol.  Meds ordered this encounter  Medications   olmesartan (BENICAR) 40 MG tablet    Sig: Take 1 tablet (40 mg total) by mouth daily.    Dispense:  90 tablet    Refill:  1   carvedilol (COREG) 3.125 MG tablet    Sig: TAKE (1) TABLET TWICE A DAY WITH MEALS (BREAKFAST AND SUPPER)    Dispense:  180 tablet    Refill:  1   furosemide (LASIX) 20 MG tablet    Sig: Take 1 tablet (20  mg total) by mouth daily.    Dispense:  90 tablet    Refill:  1     Follow-up: Return in about 6 months (around 10/28/2021).  Scarlette Calico, MD

## 2021-05-21 ENCOUNTER — Other Ambulatory Visit: Payer: Self-pay | Admitting: Oncology

## 2021-05-21 DIAGNOSIS — C7951 Secondary malignant neoplasm of bone: Secondary | ICD-10-CM

## 2021-06-05 ENCOUNTER — Other Ambulatory Visit: Payer: Self-pay | Admitting: Internal Medicine

## 2021-06-05 ENCOUNTER — Other Ambulatory Visit: Payer: Self-pay | Admitting: Oncology

## 2021-06-05 DIAGNOSIS — J4521 Mild intermittent asthma with (acute) exacerbation: Secondary | ICD-10-CM

## 2021-06-09 ENCOUNTER — Other Ambulatory Visit: Payer: Self-pay | Admitting: Internal Medicine

## 2021-06-09 DIAGNOSIS — Z0000 Encounter for general adult medical examination without abnormal findings: Secondary | ICD-10-CM

## 2021-06-09 DIAGNOSIS — Z9109 Other allergy status, other than to drugs and biological substances: Secondary | ICD-10-CM

## 2021-06-23 ENCOUNTER — Other Ambulatory Visit: Payer: Self-pay | Admitting: Oncology

## 2021-06-23 DIAGNOSIS — Z8546 Personal history of malignant neoplasm of prostate: Secondary | ICD-10-CM

## 2021-07-01 ENCOUNTER — Ambulatory Visit: Payer: Medicare Other | Admitting: Oncology

## 2021-07-01 ENCOUNTER — Inpatient Hospital Stay: Payer: Medicare Other | Attending: Internal Medicine

## 2021-07-01 ENCOUNTER — Other Ambulatory Visit: Payer: Medicare Other

## 2021-07-10 ENCOUNTER — Other Ambulatory Visit: Payer: Self-pay | Admitting: *Deleted

## 2021-07-10 DIAGNOSIS — C7951 Secondary malignant neoplasm of bone: Secondary | ICD-10-CM

## 2021-07-10 MED ORDER — OXYCODONE HCL 5 MG PO TABS: ORAL_TABLET | ORAL | 0 refills | Status: DC

## 2021-07-27 ENCOUNTER — Encounter: Payer: Self-pay | Admitting: Oncology

## 2021-07-29 ENCOUNTER — Encounter: Payer: Self-pay | Admitting: Oncology

## 2021-07-30 ENCOUNTER — Other Ambulatory Visit: Payer: Self-pay

## 2021-07-30 ENCOUNTER — Inpatient Hospital Stay: Payer: Medicare Other | Attending: Oncology

## 2021-07-30 ENCOUNTER — Inpatient Hospital Stay: Payer: Medicare Other

## 2021-07-30 ENCOUNTER — Inpatient Hospital Stay (HOSPITAL_BASED_OUTPATIENT_CLINIC_OR_DEPARTMENT_OTHER): Payer: Medicare Other | Admitting: Oncology

## 2021-07-30 VITALS — BP 116/86 | HR 92 | Temp 98.1°F | Resp 18 | Ht 68.0 in | Wt 220.3 lb

## 2021-07-30 DIAGNOSIS — Z8546 Personal history of malignant neoplasm of prostate: Secondary | ICD-10-CM

## 2021-07-30 DIAGNOSIS — C61 Malignant neoplasm of prostate: Secondary | ICD-10-CM | POA: Insufficient documentation

## 2021-07-30 DIAGNOSIS — Z79899 Other long term (current) drug therapy: Secondary | ICD-10-CM | POA: Insufficient documentation

## 2021-07-30 DIAGNOSIS — C7951 Secondary malignant neoplasm of bone: Secondary | ICD-10-CM

## 2021-07-30 DIAGNOSIS — C9000 Multiple myeloma not having achieved remission: Secondary | ICD-10-CM | POA: Diagnosis not present

## 2021-07-30 DIAGNOSIS — C7952 Secondary malignant neoplasm of bone marrow: Secondary | ICD-10-CM | POA: Diagnosis not present

## 2021-07-30 DIAGNOSIS — Z5111 Encounter for antineoplastic chemotherapy: Secondary | ICD-10-CM | POA: Diagnosis not present

## 2021-07-30 LAB — CMP (CANCER CENTER ONLY)
ALT: 22 U/L (ref 0–44)
AST: 16 U/L (ref 15–41)
Albumin: 3.8 g/dL (ref 3.5–5.0)
Alkaline Phosphatase: 65 U/L (ref 38–126)
Anion gap: 8 (ref 5–15)
BUN: 20 mg/dL (ref 8–23)
CO2: 24 mmol/L (ref 22–32)
Calcium: 9.5 mg/dL (ref 8.9–10.3)
Chloride: 102 mmol/L (ref 98–111)
Creatinine: 1.03 mg/dL (ref 0.61–1.24)
GFR, Estimated: >60 mL/min (ref >60)
Glucose, Bld: 119 mg/dL — ABNORMAL HIGH (ref 70–99)
Potassium: 4 mmol/L (ref 3.5–5.1)
Sodium: 134 mmol/L — ABNORMAL LOW (ref 135–145)
Total Bilirubin: 0.6 mg/dL (ref 0.3–1.2)
Total Protein: 6.6 g/dL (ref 6.5–8.1)

## 2021-07-30 LAB — CBC WITH DIFFERENTIAL (CANCER CENTER ONLY)
Abs Immature Granulocytes: 0.05 10*3/uL (ref 0.00–0.07)
Basophils Absolute: 0.1 10*3/uL (ref 0.0–0.1)
Basophils Relative: 0 %
Eosinophils Absolute: 0.2 10*3/uL (ref 0.0–0.5)
Eosinophils Relative: 1 %
HCT: 37.3 % — ABNORMAL LOW (ref 39.0–52.0)
Hemoglobin: 12.7 g/dL — ABNORMAL LOW (ref 13.0–17.0)
Immature Granulocytes: 0 %
Lymphocytes Relative: 17 %
Lymphs Abs: 2 10*3/uL (ref 0.7–4.0)
MCH: 29.1 pg (ref 26.0–34.0)
MCHC: 34 g/dL (ref 30.0–36.0)
MCV: 85.6 fL (ref 80.0–100.0)
Monocytes Absolute: 0.8 10*3/uL (ref 0.1–1.0)
Monocytes Relative: 7 %
Neutro Abs: 8.8 10*3/uL — ABNORMAL HIGH (ref 1.7–7.7)
Neutrophils Relative %: 75 %
Platelet Count: 310 10*3/uL (ref 150–400)
RBC: 4.36 MIL/uL (ref 4.22–5.81)
RDW: 13.7 % (ref 11.5–15.5)
WBC Count: 11.9 10*3/uL — ABNORMAL HIGH (ref 4.0–10.5)
nRBC: 0 % (ref 0.0–0.2)

## 2021-07-30 LAB — PSA: PSA: 0.7

## 2021-07-30 MED ORDER — LEUPROLIDE ACETATE (3 MONTH) 22.5 MG ~~LOC~~ KIT
22.5000 mg | PACK | Freq: Once | SUBCUTANEOUS | Status: AC
  Administered 2021-07-30: 22.5 mg via SUBCUTANEOUS
  Filled 2021-07-30: qty 22.5

## 2021-07-30 MED ORDER — DENOSUMAB 60 MG/ML ~~LOC~~ SOSY
60.0000 mg | PREFILLED_SYRINGE | Freq: Once | SUBCUTANEOUS | Status: AC
  Administered 2021-07-30: 60 mg via SUBCUTANEOUS
  Filled 2021-07-30: qty 1

## 2021-07-30 NOTE — Progress Notes (Signed)
Hematology and Oncology Follow Up Visit  [name] Holder [id number] 06-15-1956 66 y.o. 07/30/2021 2:42 PM [name] Holder, MDJones, [name] Right, MD   Principle Diagnosis: 66 year old man with prostate cancer diagnosed in 2014.  He had developed castration-resistant advanced disease with bone involvement and lymphadenopathy in 2018.    Prior Therapy:  Androgen deprivation therapy in 2014.  He developed castration resistant in 2018.  He is status post T10, T12, kyphoplasty completed on January 22 of 2020.  Current therapy:  Zytiga 1000 mg daily with prednisone at 5 mg daily started in 2018.  Eligard 22.5 mg every 3 months.  He will receive Eligard today and repeated in 3 months.  Interim History: [name] Holder returns today for a follow-up visit.  Since last visit, he reports no major changes in his health.  He continues to tolerate Zytiga without any major complaints.  He had denies any nausea, vomiting or abdominal pain.  He does report occasional constipation.  He denies any worsening edema.  His performance status quality of life remained reasonable.  His pain is manageable with the current oxycodone dosing.          Medications: Updated on review. Current Outpatient Medications  Medication Sig Dispense Refill   abiraterone acetate (ZYTIGA) 250 MG tablet Take 4 tablets (1,000 mg total) by mouth daily. 120 tablet 10   acetaminophen (TYLENOL) 325 MG tablet Take 325 mg by mouth daily as needed for fever.     albuterol (PROVENTIL) (2.5 MG/3ML) 0.083% nebulizer solution NEBULIZE 1 VIAL EVERY 6-8 HOURS AS NEEDED FOR WHEEZING OR SHORTNESS OF BREATH 360 mL 1   albuterol (VENTOLIN HFA) 108 (90 Base) MCG/ACT inhaler Inhale 2 puffs into the lungs every 6 (six) hours as needed for wheezing. 2 PUFFS EVERY 6 HOURS AS NEEDED FOR WHEEZING 8.5 g 5   azelastine (ASTELIN) 0.1 % nasal spray Place 2 puffs into the nose in the morning and at bedtime. 90 mL 1   CALCIUM-VITAMIN D PO Take 1 tablet by mouth  2 (two) times daily.      carvedilol (COREG) 3.125 MG tablet TAKE (1) TABLET TWICE A DAY WITH MEALS (BREAKFAST AND SUPPER) 180 tablet 1   denosumab (PROLIA) 60 MG/ML SOSY injection Inject 60 mg into the skin every 6 (six) months.     fluticasone (FLONASE) 50 MCG/ACT nasal spray Place 2 sprays into both nostrils daily. 48 g 1   Fluticasone-Umeclidin-Vilant (TRELEGY ELLIPTA) 100-62.5-25 MCG/INH AEPB INHALE 1 PUFF INTO LUNGS DAILY 60 each 11   furosemide (LASIX) 20 MG tablet Take 1 tablet (20 mg total) by mouth daily. 90 tablet 1   Leuprolide Acetate (LUPRON IJ) Inject 1 Dose as directed every 4 (four) months. This is administered by [facility]     Multiple Vitamins-Minerals (CENTRUM SILVER 50+MEN PO) Take 1 tablet by mouth daily.     NARCAN 4 MG/0.1ML LIQD nasal spray kit Place 0.4 mg into the nose once as needed (overdose).      olmesartan (BENICAR) 40 MG tablet Take 1 tablet (40 mg total) by mouth daily. 90 tablet 1   oxyCODONE (OXY IR/ROXICODONE) 5 MG immediate release tablet Take every 4 hours as needed 180 tablet 0   polyethylene glycol (MIRALAX) 17 g packet Take 17 g by mouth daily. 30 each 6   polyethylene glycol powder (GLYCOLAX/MIRALAX) 17 GM/SCOOP powder Take 17 g (1 capful) by mouth daily as directed. 510 g 6   predniSONE (DELTASONE) 5 MG tablet TAKE 1 TABLET 2 TIMES A  DAY 90 tablet 3   sodium chloride 1 g tablet Take 2 g by mouth 2 (two) times daily with a meal.     solifenacin (VESICARE) 5 MG tablet Take 5 mg by mouth daily.     No current facility-administered medications for this visit.     Allergies:  Allergies  Allergen Reactions   Amlodipine Swelling    edema   Tetracycline Rash      Physical Exam:     Blood pressure 116/86, pulse 92, temperature 98.1 F (36.7 C), temperature source Temporal, resp. rate 18, height _0  (1.727 m), weight 220 lb 4.8 oz (99.9 kg), SpO2 100 %.     ECOG: 1    General appearance: Alert, awake without any distress. Head:  Atraumatic without abnormalities Oropharynx: Without any thrush or ulcers. Eyes: No scleral icterus. Lymph nodes: No lymphadenopathy noted in the cervical, supraclavicular, or axillary nodes Heart:regular rate and rhythm, without any murmurs or gallops.   Lung: Clear to auscultation without any rhonchi, wheezes or dullness to percussion. Abdomin: Soft, nontender without any shifting dullness or ascites. Musculoskeletal: No clubbing or cyanosis. Neurological: No motor or sensory deficits. Skin: No rashes or lesions.            Lab Results: Lab Results  Component Value Date   WBC 11.5 (H) 03/12/2021   HGB 12.4 (L) 03/12/2021   HCT 37.0 (L) 03/12/2021   MCV 88.5 03/12/2021   PLT 280 03/12/2021     Chemistry      Component Value Date/Time   NA 139 03/12/2021 1006   K 3.9 03/12/2021 1006   CL 107 03/12/2021 1006   CO2 23 03/12/2021 1006   BUN 17 03/12/2021 1006   CREATININE 0.93 03/12/2021 1006      Component Value Date/Time   CALCIUM 9.3 03/12/2021 1006   ALKPHOS 56 03/12/2021 1006   AST 11 (L) 03/12/2021 1006   ALT 19 03/12/2021 1006   BILITOT 0.5 03/12/2021 1006         Latest Reference Range & Units 09/04/20 00:00 09/04/20 10:03 12/04/20 09:42 03/12/21 00:00 03/12/21 10:06  PSA  0.6 (E)   0.40 (E)   Prostate Specific Ag, Serum 0.0 - 4.0 ng/mL  0.6 0.7  0.4  (E): External lab result     Impression and Plan:  66 year old man with:   1.    Castration-resistant advanced prostate cancer with lymphadenopathy diagnosed in 2018  His disease status was updated at this time and treatment choices were reviewed.  His PSA continues to be under reasonable control at 0.4.  Risks and benefits of continuing Zytiga were discussed.  Complications that include hypertension, weight gain over others were reviewed.  Alternative treatment options including systemic chemotherapy were reiterated.  He is agreeable to proceed.   2.  Compression fracture tween T10 and T12:  Related to osteoporosis.  No recent exacerbation.    3.  Bone directed therapy: Risks and benefits of proceeding with Prolia were discussed.  Complications including osteonecrosis of the jaw and hypocalcemia were discussed.   4.  Pain: He is currently on oxycodone with pain is manageable.  This will be refilled for him.   5.  Prognosis and goals of care: Therapy remains palliative though aggressive measures are warranted given his reasonable performance status.      6.  Follow-up: In 3 months for a follow-up visit.   30  minutes were spent on this visit.  The time was dedicated to reviewing laboratory data,  disease status update and outlining future plan of care.         Zola Button, MD 1/4/20232:42 PM

## 2021-07-31 ENCOUNTER — Telehealth: Payer: Self-pay

## 2021-07-31 LAB — PROSTATE-SPECIFIC AG, SERUM (LABCORP): Prostate Specific Ag, Serum: 0.7 ng/mL (ref 0.0–4.0)

## 2021-07-31 NOTE — Telephone Encounter (Signed)
Pt advised with understanding. 

## 2021-07-31 NOTE — Telephone Encounter (Signed)
-----   Message from Wyatt Portela, MD sent at 07/31/2021  9:17 AM EST ----- Please let him know his PSA is slightly up. No change is needed

## 2021-08-06 DIAGNOSIS — E222 Syndrome of inappropriate secretion of antidiuretic hormone: Secondary | ICD-10-CM | POA: Diagnosis not present

## 2021-08-06 DIAGNOSIS — Z8639 Personal history of other endocrine, nutritional and metabolic disease: Secondary | ICD-10-CM | POA: Diagnosis not present

## 2021-08-07 DIAGNOSIS — K317 Polyp of stomach and duodenum: Secondary | ICD-10-CM | POA: Diagnosis not present

## 2021-08-07 DIAGNOSIS — D124 Benign neoplasm of descending colon: Secondary | ICD-10-CM | POA: Diagnosis not present

## 2021-08-07 DIAGNOSIS — R12 Heartburn: Secondary | ICD-10-CM | POA: Diagnosis not present

## 2021-08-07 DIAGNOSIS — Z86010 Personal history of colonic polyps: Secondary | ICD-10-CM | POA: Diagnosis not present

## 2021-08-07 DIAGNOSIS — D175 Benign lipomatous neoplasm of intra-abdominal organs: Secondary | ICD-10-CM | POA: Diagnosis not present

## 2021-08-07 DIAGNOSIS — D120 Benign neoplasm of cecum: Secondary | ICD-10-CM | POA: Diagnosis not present

## 2021-08-07 DIAGNOSIS — D125 Benign neoplasm of sigmoid colon: Secondary | ICD-10-CM | POA: Diagnosis not present

## 2021-08-07 DIAGNOSIS — D122 Benign neoplasm of ascending colon: Secondary | ICD-10-CM | POA: Diagnosis not present

## 2021-08-07 DIAGNOSIS — K2950 Unspecified chronic gastritis without bleeding: Secondary | ICD-10-CM | POA: Diagnosis not present

## 2021-08-07 DIAGNOSIS — K648 Other hemorrhoids: Secondary | ICD-10-CM | POA: Diagnosis not present

## 2021-08-08 ENCOUNTER — Other Ambulatory Visit: Payer: Self-pay | Admitting: Internal Medicine

## 2021-08-08 DIAGNOSIS — J4521 Mild intermittent asthma with (acute) exacerbation: Secondary | ICD-10-CM

## 2021-08-14 DIAGNOSIS — D124 Benign neoplasm of descending colon: Secondary | ICD-10-CM | POA: Diagnosis not present

## 2021-08-14 DIAGNOSIS — D120 Benign neoplasm of cecum: Secondary | ICD-10-CM | POA: Diagnosis not present

## 2021-08-14 DIAGNOSIS — D122 Benign neoplasm of ascending colon: Secondary | ICD-10-CM | POA: Diagnosis not present

## 2021-08-14 DIAGNOSIS — D125 Benign neoplasm of sigmoid colon: Secondary | ICD-10-CM | POA: Diagnosis not present

## 2021-08-18 ENCOUNTER — Other Ambulatory Visit: Payer: Self-pay | Admitting: Oncology

## 2021-08-18 DIAGNOSIS — C7951 Secondary malignant neoplasm of bone: Secondary | ICD-10-CM

## 2021-08-18 MED ORDER — OXYCODONE HCL 5 MG PO TABS: ORAL_TABLET | ORAL | 0 refills | Status: DC

## 2021-09-16 ENCOUNTER — Other Ambulatory Visit: Payer: Self-pay

## 2021-09-16 ENCOUNTER — Other Ambulatory Visit: Payer: Self-pay | Admitting: Oncology

## 2021-09-16 ENCOUNTER — Encounter (HOSPITAL_COMMUNITY): Payer: Self-pay

## 2021-09-16 ENCOUNTER — Emergency Department (HOSPITAL_COMMUNITY): Payer: Medicare Other

## 2021-09-16 ENCOUNTER — Inpatient Hospital Stay (HOSPITAL_COMMUNITY)
Admission: EM | Admit: 2021-09-16 | Discharge: 2021-09-20 | DRG: 871 | Disposition: A | Discharge status: Home / Self Care | Payer: Medicare Other | Attending: Internal Medicine | Admitting: Internal Medicine

## 2021-09-16 ENCOUNTER — Inpatient Hospital Stay (HOSPITAL_COMMUNITY): Payer: Medicare Other

## 2021-09-16 DIAGNOSIS — Z823 Family history of stroke: Secondary | ICD-10-CM

## 2021-09-16 DIAGNOSIS — D638 Anemia in other chronic diseases classified elsewhere: Secondary | ICD-10-CM

## 2021-09-16 DIAGNOSIS — Z8371 Family history of colonic polyps: Secondary | ICD-10-CM | POA: Diagnosis not present

## 2021-09-16 DIAGNOSIS — Z20822 Contact with and (suspected) exposure to covid-19: Secondary | ICD-10-CM | POA: Diagnosis not present

## 2021-09-16 DIAGNOSIS — E2740 Unspecified adrenocortical insufficiency: Secondary | ICD-10-CM | POA: Diagnosis present

## 2021-09-16 DIAGNOSIS — J189 Pneumonia, unspecified organism: Secondary | ICD-10-CM | POA: Diagnosis not present

## 2021-09-16 DIAGNOSIS — E872 Acidosis, unspecified: Secondary | ICD-10-CM | POA: Diagnosis present

## 2021-09-16 DIAGNOSIS — Z8551 Personal history of malignant neoplasm of bladder: Secondary | ICD-10-CM | POA: Diagnosis not present

## 2021-09-16 DIAGNOSIS — M545 Low back pain, unspecified: Secondary | ICD-10-CM | POA: Diagnosis present

## 2021-09-16 DIAGNOSIS — Z881 Allergy status to other antibiotic agents status: Secondary | ICD-10-CM

## 2021-09-16 DIAGNOSIS — D630 Anemia in neoplastic disease: Secondary | ICD-10-CM | POA: Diagnosis present

## 2021-09-16 DIAGNOSIS — C7951 Secondary malignant neoplasm of bone: Secondary | ICD-10-CM

## 2021-09-16 DIAGNOSIS — E222 Syndrome of inappropriate secretion of antidiuretic hormone: Secondary | ICD-10-CM | POA: Diagnosis present

## 2021-09-16 DIAGNOSIS — I959 Hypotension, unspecified: Secondary | ICD-10-CM | POA: Diagnosis not present

## 2021-09-16 DIAGNOSIS — Z8249 Family history of ischemic heart disease and other diseases of the circulatory system: Secondary | ICD-10-CM | POA: Diagnosis not present

## 2021-09-16 DIAGNOSIS — R6520 Severe sepsis without septic shock: Secondary | ICD-10-CM | POA: Diagnosis not present

## 2021-09-16 DIAGNOSIS — Z888 Allergy status to other drugs, medicaments and biological substances status: Secondary | ICD-10-CM | POA: Diagnosis not present

## 2021-09-16 DIAGNOSIS — Z79899 Other long term (current) drug therapy: Secondary | ICD-10-CM

## 2021-09-16 DIAGNOSIS — J4530 Mild persistent asthma, uncomplicated: Secondary | ICD-10-CM | POA: Diagnosis present

## 2021-09-16 DIAGNOSIS — J984 Other disorders of lung: Secondary | ICD-10-CM | POA: Diagnosis not present

## 2021-09-16 DIAGNOSIS — J4520 Mild intermittent asthma, uncomplicated: Secondary | ICD-10-CM | POA: Diagnosis present

## 2021-09-16 DIAGNOSIS — Z8042 Family history of malignant neoplasm of prostate: Secondary | ICD-10-CM | POA: Diagnosis not present

## 2021-09-16 DIAGNOSIS — J690 Pneumonitis due to inhalation of food and vomit: Secondary | ICD-10-CM | POA: Diagnosis present

## 2021-09-16 DIAGNOSIS — Z87891 Personal history of nicotine dependence: Secondary | ICD-10-CM

## 2021-09-16 DIAGNOSIS — Z833 Family history of diabetes mellitus: Secondary | ICD-10-CM

## 2021-09-16 DIAGNOSIS — J440 Chronic obstructive pulmonary disease with acute lower respiratory infection: Secondary | ICD-10-CM | POA: Diagnosis not present

## 2021-09-16 DIAGNOSIS — K219 Gastro-esophageal reflux disease without esophagitis: Secondary | ICD-10-CM | POA: Diagnosis present

## 2021-09-16 DIAGNOSIS — D72829 Elevated white blood cell count, unspecified: Secondary | ICD-10-CM

## 2021-09-16 DIAGNOSIS — Z83438 Family history of other disorder of lipoprotein metabolism and other lipidemia: Secondary | ICD-10-CM

## 2021-09-16 DIAGNOSIS — A419 Sepsis, unspecified organism: Principal | ICD-10-CM | POA: Diagnosis present

## 2021-09-16 DIAGNOSIS — I10 Essential (primary) hypertension: Secondary | ICD-10-CM | POA: Diagnosis not present

## 2021-09-16 DIAGNOSIS — R109 Unspecified abdominal pain: Secondary | ICD-10-CM | POA: Diagnosis not present

## 2021-09-16 DIAGNOSIS — R410 Disorientation, unspecified: Secondary | ICD-10-CM | POA: Diagnosis not present

## 2021-09-16 DIAGNOSIS — G9340 Encephalopathy, unspecified: Secondary | ICD-10-CM | POA: Diagnosis not present

## 2021-09-16 DIAGNOSIS — E871 Hypo-osmolality and hyponatremia: Secondary | ICD-10-CM

## 2021-09-16 DIAGNOSIS — Z86010 Personal history of colonic polyps: Secondary | ICD-10-CM | POA: Diagnosis not present

## 2021-09-16 DIAGNOSIS — C61 Malignant neoplasm of prostate: Secondary | ICD-10-CM | POA: Diagnosis not present

## 2021-09-16 DIAGNOSIS — E876 Hypokalemia: Secondary | ICD-10-CM | POA: Diagnosis not present

## 2021-09-16 DIAGNOSIS — Z87442 Personal history of urinary calculi: Secondary | ICD-10-CM

## 2021-09-16 DIAGNOSIS — G8929 Other chronic pain: Secondary | ICD-10-CM | POA: Diagnosis present

## 2021-09-16 DIAGNOSIS — Z7952 Long term (current) use of systemic steroids: Secondary | ICD-10-CM

## 2021-09-16 DIAGNOSIS — Z8261 Family history of arthritis: Secondary | ICD-10-CM

## 2021-09-16 DIAGNOSIS — G9341 Metabolic encephalopathy: Secondary | ICD-10-CM

## 2021-09-16 DIAGNOSIS — R000 Tachycardia, unspecified: Secondary | ICD-10-CM | POA: Diagnosis not present

## 2021-09-16 DIAGNOSIS — R1310 Dysphagia, unspecified: Secondary | ICD-10-CM

## 2021-09-16 DIAGNOSIS — R918 Other nonspecific abnormal finding of lung field: Secondary | ICD-10-CM | POA: Diagnosis not present

## 2021-09-16 LAB — CBC WITH DIFFERENTIAL/PLATELET
Abs Immature Granulocytes: 0.19 10*3/uL — ABNORMAL HIGH (ref 0.00–0.07)
Basophils Absolute: 0.1 10*3/uL (ref 0.0–0.1)
Basophils Relative: 0 %
Eosinophils Absolute: 0 10*3/uL (ref 0.0–0.5)
Eosinophils Relative: 0 %
HCT: 36.2 % — ABNORMAL LOW (ref 39.0–52.0)
Hemoglobin: 12 g/dL — ABNORMAL LOW (ref 13.0–17.0)
Immature Granulocytes: 1 %
Lymphocytes Relative: 6 %
Lymphs Abs: 1.5 10*3/uL (ref 0.7–4.0)
MCH: 29.1 pg (ref 26.0–34.0)
MCHC: 33.1 g/dL (ref 30.0–36.0)
MCV: 87.9 fL (ref 80.0–100.0)
Monocytes Absolute: 1.2 10*3/uL — ABNORMAL HIGH (ref 0.1–1.0)
Monocytes Relative: 5 %
Neutro Abs: 21 10*3/uL — ABNORMAL HIGH (ref 1.7–7.7)
Neutrophils Relative %: 88 %
Platelets: 245 10*3/uL (ref 150–400)
RBC: 4.12 MIL/uL — ABNORMAL LOW (ref 4.22–5.81)
RDW: 14 % (ref 11.5–15.5)
WBC: 23.9 10*3/uL — ABNORMAL HIGH (ref 4.0–10.5)
nRBC: 0 % (ref 0.0–0.2)

## 2021-09-16 LAB — COMPREHENSIVE METABOLIC PANEL
ALT: 21 U/L (ref 0–44)
AST: 23 U/L (ref 15–41)
Albumin: 3.8 g/dL (ref 3.5–5.0)
Alkaline Phosphatase: 47 U/L (ref 38–126)
Anion gap: 10 (ref 5–15)
BUN: 25 mg/dL — ABNORMAL HIGH (ref 8–23)
CO2: 23 mmol/L (ref 22–32)
Calcium: 9.2 mg/dL (ref 8.9–10.3)
Chloride: 102 mmol/L (ref 98–111)
Creatinine, Ser: 1.17 mg/dL (ref 0.61–1.24)
GFR, Estimated: >60 mL/min (ref >60)
Glucose, Bld: 115 mg/dL — ABNORMAL HIGH (ref 70–99)
Potassium: 3.5 mmol/L (ref 3.5–5.1)
Sodium: 135 mmol/L (ref 135–145)
Total Bilirubin: 0.5 mg/dL (ref 0.3–1.2)
Total Protein: 6.5 g/dL (ref 6.5–8.1)

## 2021-09-16 LAB — URINALYSIS, ROUTINE W REFLEX MICROSCOPIC
Bilirubin Urine: NEGATIVE
Glucose, UA: NEGATIVE mg/dL
Hgb urine dipstick: NEGATIVE
Ketones, ur: NEGATIVE mg/dL
Leukocytes,Ua: NEGATIVE
Nitrite: NEGATIVE
Protein, ur: NEGATIVE mg/dL
Specific Gravity, Urine: 1.016 (ref 1.005–1.030)
pH: 6 (ref 5.0–8.0)

## 2021-09-16 LAB — RESP PANEL BY RT-PCR (FLU A&B, COVID) ARPGX2
Influenza A by PCR: NEGATIVE
Influenza B by PCR: NEGATIVE
SARS Coronavirus 2 by RT PCR: NEGATIVE

## 2021-09-16 LAB — CBG MONITORING, ED: Glucose-Capillary: 131 mg/dL — ABNORMAL HIGH (ref 70–99)

## 2021-09-16 LAB — LIPASE, BLOOD: Lipase: 21 U/L (ref 11–51)

## 2021-09-16 LAB — LACTIC ACID, PLASMA
Lactic Acid, Venous: 2.4 mmol/L (ref 0.5–1.9)
Lactic Acid, Venous: 2.1 mmol/L (ref 0.5–1.9)

## 2021-09-16 LAB — AMMONIA: Ammonia: 17 umol/L (ref 9–35)

## 2021-09-16 MED ORDER — VANCOMYCIN HCL 1250 MG/250ML IV SOLN: 1250.0000 mg | INTRAVENOUS | Status: DC

## 2021-09-16 MED ORDER — ALBUTEROL SULFATE (2.5 MG/3ML) 0.083% IN NEBU
5.0000 mg | INHALATION_SOLUTION | Freq: Once | RESPIRATORY_TRACT | Status: AC
  Administered 2021-09-16: 5 mg via RESPIRATORY_TRACT
  Filled 2021-09-16: qty 6

## 2021-09-16 MED ORDER — SODIUM CHLORIDE (PF) 0.9 % IJ SOLN
INTRAMUSCULAR | Status: AC
  Filled 2021-09-16: qty 50

## 2021-09-16 MED ORDER — SODIUM CHLORIDE 0.9 % IV SOLN
2.0000 g | Freq: Three times a day (TID) | INTRAVENOUS | Status: DC
  Administered 2021-09-16 – 2021-09-17 (×2): 2 g via INTRAVENOUS
  Filled 2021-09-16 (×3): qty 2

## 2021-09-16 MED ORDER — LACTATED RINGERS IV BOLUS
1000.0000 mL | Freq: Once | INTRAVENOUS | Status: AC
  Administered 2021-09-16: 1000 mL via INTRAVENOUS

## 2021-09-16 MED ORDER — UMECLIDINIUM BROMIDE 62.5 MCG/ACT IN AEPB
1.0000 | INHALATION_SPRAY | Freq: Every day | RESPIRATORY_TRACT | Status: DC
  Administered 2021-09-17 – 2021-09-20 (×4): 1 via RESPIRATORY_TRACT
  Filled 2021-09-16: qty 7

## 2021-09-16 MED ORDER — ASCORBIC ACID 500 MG PO TABS
500.0000 mg | ORAL_TABLET | Freq: Every evening | ORAL | Status: DC
  Administered 2021-09-17 – 2021-09-19 (×3): 500 mg via ORAL
  Filled 2021-09-16 (×3): qty 1

## 2021-09-16 MED ORDER — METRONIDAZOLE 500 MG/100ML IV SOLN
500.0000 mg | Freq: Once | INTRAVENOUS | Status: AC
  Administered 2021-09-16: 500 mg via INTRAVENOUS
  Filled 2021-09-16: qty 100

## 2021-09-16 MED ORDER — SODIUM CHLORIDE 1 G PO TABS
1.0000 g | ORAL_TABLET | Freq: Two times a day (BID) | ORAL | Status: DC
  Administered 2021-09-17 – 2021-09-20 (×7): 1 g via ORAL
  Filled 2021-09-16 (×8): qty 1

## 2021-09-16 MED ORDER — ALBUTEROL SULFATE (2.5 MG/3ML) 0.083% IN NEBU
2.5000 mg | INHALATION_SOLUTION | Freq: Four times a day (QID) | RESPIRATORY_TRACT | Status: DC | PRN
  Administered 2021-09-17 (×2): 2.5 mg via RESPIRATORY_TRACT
  Filled 2021-09-16 (×2): qty 3

## 2021-09-16 MED ORDER — LACTATED RINGERS IV SOLN: INTRAVENOUS | Status: DC

## 2021-09-16 MED ORDER — IOHEXOL 300 MG/ML  SOLN: 75.0000 mL | Freq: Once | INTRAMUSCULAR | Status: DC | PRN

## 2021-09-16 MED ORDER — OXYCODONE HCL 5 MG PO TABS
5.0000 mg | ORAL_TABLET | ORAL | Status: DC
  Administered 2021-09-17 – 2021-09-20 (×21): 5 mg via ORAL
  Filled 2021-09-16 (×22): qty 1

## 2021-09-16 MED ORDER — LACTATED RINGERS IV BOLUS (SEPSIS)
1000.0000 mL | Freq: Once | INTRAVENOUS | Status: AC
  Administered 2021-09-16: 1000 mL via INTRAVENOUS

## 2021-09-16 MED ORDER — ONDANSETRON HCL 4 MG/2ML IJ SOLN: 4.0000 mg | Freq: Four times a day (QID) | INTRAMUSCULAR | Status: DC | PRN

## 2021-09-16 MED ORDER — VITAMIN E 45 MG (100 UNIT) PO CAPS
400.0000 [IU] | ORAL_CAPSULE | Freq: Every evening | ORAL | Status: DC
  Administered 2021-09-17 – 2021-09-19 (×3): 400 [IU] via ORAL
  Filled 2021-09-16 (×3): qty 4

## 2021-09-16 MED ORDER — DARIFENACIN HYDROBROMIDE ER 7.5 MG PO TB24
7.5000 mg | ORAL_TABLET | Freq: Every day | ORAL | Status: DC
Start: 2021-09-17 — End: 2021-09-20
  Administered 2021-09-17 – 2021-09-20 (×4): 7.5 mg via ORAL
  Filled 2021-09-16 (×4): qty 1

## 2021-09-16 MED ORDER — ACETAMINOPHEN 650 MG RE SUPP: 650.0000 mg | Freq: Four times a day (QID) | RECTAL | Status: DC | PRN

## 2021-09-16 MED ORDER — FLUTICASONE PROPIONATE 50 MCG/ACT NA SUSP
2.0000 | Freq: Every day | NASAL | Status: DC
  Administered 2021-09-17 – 2021-09-20 (×4): 2 via NASAL
  Filled 2021-09-16: qty 16

## 2021-09-16 MED ORDER — VANCOMYCIN HCL IN DEXTROSE 1-5 GM/200ML-% IV SOLN
1000.0000 mg | Freq: Once | INTRAVENOUS | Status: DC
  Filled 2021-09-16: qty 200

## 2021-09-16 MED ORDER — SODIUM CHLORIDE 0.9 % IV SOLN
2.0000 g | Freq: Once | INTRAVENOUS | Status: AC
  Administered 2021-09-16: 2 g via INTRAVENOUS
  Filled 2021-09-16: qty 2

## 2021-09-16 MED ORDER — FLUTICASONE FUROATE-VILANTEROL 100-25 MCG/ACT IN AEPB
1.0000 | INHALATION_SPRAY | Freq: Every day | RESPIRATORY_TRACT | Status: DC
  Administered 2021-09-17 – 2021-09-20 (×4): 1 via RESPIRATORY_TRACT
  Filled 2021-09-16: qty 28

## 2021-09-16 MED ORDER — OXYCODONE-ACETAMINOPHEN 5-325 MG PO TABS
2.0000 | ORAL_TABLET | Freq: Once | ORAL | Status: AC
  Administered 2021-09-16: 2 via ORAL
  Filled 2021-09-16: qty 2

## 2021-09-16 MED ORDER — HYDROCORTISONE SOD SUC (PF) 100 MG IJ SOLR
100.0000 mg | Freq: Two times a day (BID) | INTRAMUSCULAR | Status: DC
  Administered 2021-09-17 – 2021-09-20 (×8): 100 mg via INTRAVENOUS
  Filled 2021-09-16 (×8): qty 2

## 2021-09-16 MED ORDER — ACETAMINOPHEN 325 MG PO TABS
650.0000 mg | ORAL_TABLET | Freq: Four times a day (QID) | ORAL | Status: DC | PRN
Start: 2021-09-16 — End: 2021-09-20
  Administered 2021-09-17 – 2021-09-20 (×6): 650 mg via ORAL
  Filled 2021-09-16 (×7): qty 2

## 2021-09-16 MED ORDER — METRONIDAZOLE 500 MG/100ML IV SOLN
500.0000 mg | Freq: Two times a day (BID) | INTRAVENOUS | Status: DC
  Administered 2021-09-17: 500 mg via INTRAVENOUS
  Filled 2021-09-16: qty 100

## 2021-09-16 MED ORDER — ENOXAPARIN SODIUM 40 MG/0.4ML IJ SOSY
40.0000 mg | PREFILLED_SYRINGE | INTRAMUSCULAR | Status: DC
  Administered 2021-09-17: 40 mg via SUBCUTANEOUS
  Filled 2021-09-16: qty 0.4

## 2021-09-16 MED ORDER — IOHEXOL 300 MG/ML  SOLN
100.0000 mL | Freq: Once | INTRAMUSCULAR | Status: AC | PRN
Start: 2021-09-16 — End: 2021-09-16
  Administered 2021-09-16: 100 mL via INTRAVENOUS

## 2021-09-16 MED ORDER — HYDROCORTISONE SOD SUC (PF) 100 MG IJ SOLR
50.0000 mg | Freq: Once | INTRAMUSCULAR | Status: AC
Start: 2021-09-16 — End: 2021-09-16
  Administered 2021-09-16: 50 mg via INTRAVENOUS
  Filled 2021-09-16: qty 2

## 2021-09-16 MED ORDER — VANCOMYCIN HCL 2000 MG/400ML IV SOLN
2000.0000 mg | Freq: Once | INTRAVENOUS | Status: AC
  Administered 2021-09-16: 2000 mg via INTRAVENOUS
  Filled 2021-09-16: qty 400

## 2021-09-16 MED ORDER — ONDANSETRON HCL 4 MG PO TABS: 4.0000 mg | ORAL_TABLET | Freq: Four times a day (QID) | ORAL | Status: DC | PRN

## 2021-09-16 NOTE — Progress Notes (Signed)
Pharmacy Antibiotic Note  [name] Holder is a 66 y.o. male admitted on 09/16/2021 with sepsis.  Pharmacy has been consulted for cefepime and vancomycin dosing.  In ED: 2/21 1302 Cefepime 2 g IV 2/21 1714 Vancomycin 2 g IV  Plan: Continue cefepime 2 g IV every 8 hours Continue vancomycin at Vancomycin 1250 mg IV every 24 hrs (Goal AUC 400-550, Expected AUC: 482.9, SCr used: 1.17) Currently, Flagyl ordered in ED but not administered yet and not ordered by attending.  Consider Flagyl if anaerobic coverage determined to be needed. Monitor clinical progress, renal function, vancomycin level if indicated F/U C&S, abx deescalation / LOT    Height: 5\' 8"  (172.7 cm) Weight: 96.6 kg (213 lb) IBW/kg (Calculated) : 68.4  Temp (24hrs), Avg:99.3 F (37.4 C), Min:97.5 F (36.4 C), Max:101 F (38.3 C)  Recent Labs  Lab 09/16/21 1048 09/16/21 1116 09/16/21 1247 09/16/21 1248  WBC  --   --  23.9*  --   CREATININE  --  1.17  --   --   LATICACIDVEN 2.4*  --   --  2.1*    Estimated Creatinine Clearance: 69.1 mL/min (by C-G formula based on SCr of 1.17 mg/dL).    Allergies  Allergen Reactions   Amlodipine Swelling    edema   Tetracycline Rash    Antimicrobials this admission: 2/21 cefepime >>  2/21 vancomycin >>  Microbiology results: 2/21 BCx: sent 2/21 UCx: sent   Thank you for allowing pharmacy to be a part of this patients care.  Royetta Asal, PharmD, BCPS Clinical Pharmacist [place] Please utilize Amion for appropriate phone number to reach the unit pharmacist ([place]) 09/16/2021 5:35 PM

## 2021-09-16 NOTE — ED Provider Notes (Signed)
[facility] DEPT Provider Note   CSN: [id number] Arrival date & time: 09/16/21  1006     History  Chief Complaint  Patient presents with   Altered Mental Status    [name] Holder is a 67 y.o. male.  HPI 66 year old male presents today with reports of altered mental status.  Patient lives at home with his wife who is present at the bedside and gives majority of the history.  The course that the patient was in his usual state of health yesterday which is somewhat poor, but he had been managing and stable with his usual ambulation, eating, drinking.  He woke up during the night coughing and felt that he may have aspirated.  This morning he was somewhat more confused than usual.  He is having difficulty naming the year.  Blood sugar was checked prior to my evaluation is in the 130s.  He may have had some subjective fever and has had some chills.  He does not report any new shortness of breath although he feels like he is at his baseline and has some baseline shortness of breath.  He reports that he has been diagnosed with adrenal insufficiency in the past.  He is taking oral chemotherapeutic agents for his metastatic prostate cancer.    Home Medications Prior to Admission medications   Medication Sig Start Date End Date Taking? Authorizing Provider  abiraterone acetate (ZYTIGA) 250 MG tablet Take 4 tablets (1,000 mg total) by mouth daily. 11/07/20   Wyatt Portela, MD  acetaminophen (TYLENOL) 325 MG tablet Take 325 mg by mouth daily as needed for fever.    [provider]  albuterol (PROVENTIL) (2.5 MG/3ML) 0.083% nebulizer solution NEBULIZE 1 VIAL EVERY 6-8 HOURS AS NEEDED FOR WHEEZING OR SHORTNESS OF BREATH 08/08/21   [name], MD  albuterol (VENTOLIN HFA) 108 (90 Base) MCG/ACT inhaler Inhale 2 puffs into the lungs every 6 (six) hours as needed for wheezing. 2 PUFFS EVERY 6 HOURS AS NEEDED FOR WHEEZING 12/25/20   Icard, Leory Plowman L, DO  azelastine  (ASTELIN) 0.1 % nasal spray Place 2 puffs into the nose in the morning and at bedtime. 06/09/21   [name], MD  CALCIUM-VITAMIN D PO Take 1 tablet by mouth 2 (two) times daily.     [provider]  carvedilol (COREG) 3.125 MG tablet TAKE (1) TABLET TWICE A DAY WITH MEALS (BREAKFAST AND SUPPER) 04/29/21   [name], MD  denosumab (PROLIA) 60 MG/ML SOSY injection Inject 60 mg into the skin every 6 (six) months.    [provider]  fluticasone (FLONASE) 50 MCG/ACT nasal spray Place 2 sprays into both nostrils daily. 09/30/20   [name], MD  Fluticasone-Umeclidin-Vilant (TRELEGY ELLIPTA) 100-62.5-25 MCG/INH AEPB INHALE 1 PUFF INTO LUNGS DAILY 12/25/20   Icard, Leory Plowman L, DO  furosemide (LASIX) 20 MG tablet Take 1 tablet (20 mg total) by mouth daily. 04/29/21   [name], MD  Leuprolide Acetate (LUPRON IJ) Inject 1 Dose as directed every 4 (four) months. This is administered by [facility]    [provider]  Multiple Vitamins-Minerals (CENTRUM SILVER 50+MEN PO) Take 1 tablet by mouth daily.    [provider]  NARCAN 4 MG/0.1ML LIQD nasal spray kit Place 0.4 mg into the nose once as needed (overdose).  08/18/18   [provider]  olmesartan (BENICAR) 40 MG tablet Take 1 tablet (40 mg total) by mouth daily. 04/29/21   Scarlette Calico  L, MD  oxyCODONE (OXY IR/ROXICODONE) 5 MG immediate release tablet TAKE ONE TABLET EVERY 4 HOURS AS NEEDED 09/16/21   Benjiman Core, MD  polyethylene glycol (MIRALAX) 17 g packet Take 17 g by mouth daily. 09/04/20   Benjiman Core, MD  polyethylene glycol powder (GLYCOLAX/MIRALAX) 17 GM/SCOOP powder Take 17 g (1 capful) by mouth daily as directed. 06/05/21   Benjiman Core, MD  predniSONE (DELTASONE) 5 MG tablet TAKE 1 TABLET 2 TIMES A DAY 06/23/21   Benjiman Core, MD  sodium chloride 1 g tablet Take 2 g by mouth 2 (two) times daily with a meal.    [provider]  solifenacin (VESICARE) 5 MG tablet  Take 5 mg by mouth daily. 08/11/19   [provider]      Allergies    Amlodipine and Tetracycline    Review of Systems   Review of Systems  Constitutional:  Positive for chills.  HENT: Negative.    Eyes: Negative.   Respiratory:  Positive for cough and shortness of breath.   Cardiovascular: Negative.   Gastrointestinal: Negative.    Physical Exam Updated Vital Signs BP 134/82    Pulse 100    Temp (!) 101 F (38.3 C) (Rectal)    Resp 15    Ht 1.727 m (5\' 8" )    Wt 96.6 kg    SpO2 94%    BMI 32.39 kg/m  Physical Exam Vitals and nursing note (Eitel signs reviewed and blood pressure is low at 90/56, patient is tachycardic, oral temp 97.5) reviewed.  Constitutional:      Appearance: Normal appearance.  HENT:     Head: Normocephalic and atraumatic.     Right Ear: External ear normal.     Left Ear: External ear normal.     Nose: Nose normal.     Mouth/Throat:     Mouth: Mucous membranes are dry.  Eyes:     Pupils: Pupils are equal, round, and reactive to light.  Cardiovascular:     Rate and Rhythm: Regular rhythm. Tachycardia present.     Pulses: Normal pulses.  Pulmonary:     Effort: Pulmonary effort is normal.     Comments: Decreased breath sounds bilaterally Abdominal:     General: There is distension.     Palpations: Abdomen is soft. There is no mass.     Tenderness: There is no abdominal tenderness.  Musculoskeletal:        General: Normal range of motion.     Cervical back: Normal range of motion.  Skin:    General: Skin is warm and dry.     Capillary Refill: Capillary refill takes less than 2 seconds.     Coloration: Skin is jaundiced.  Neurological:     General: No focal deficit present.     Mental Status: He is alert.     Motor: Weakness present.     Comments: Patient with generalized weak but no lateralizing or focal symptoms He is oriented to the month the date but not to the year  Psychiatric:        Mood and Affect: Mood normal.    ED Results  / Procedures / Treatments   Labs (all labs ordered are listed, but only abnormal results are displayed) Labs Reviewed  LACTIC ACID, PLASMA - Abnormal; Notable for the following components:      Result Value   Lactic Acid, Venous 2.4 (*)    All other components within normal limits  LACTIC ACID, PLASMA - Abnormal; Notable for the following components:   Lactic Acid, Venous 2.1 (*)    All other components within normal limits  COMPREHENSIVE METABOLIC PANEL - Abnormal; Notable for the following components:   Glucose, Bld 115 (*)    BUN 25 (*)    All other components within normal limits  CBC WITH DIFFERENTIAL/PLATELET - Abnormal; Notable for the following components:   WBC 23.9 (*)    RBC 4.12 (*)    Hemoglobin 12.0 (*)    HCT 36.2 (*)    Neutro Abs 21.0 (*)    Monocytes Absolute 1.2 (*)    Abs Immature Granulocytes 0.19 (*)    All other components within normal limits  CBG MONITORING, ED - Abnormal; Notable for the following components:   Glucose-Capillary 131 (*)    All other components within normal limits  RESP PANEL BY RT-PCR (FLU A&B, COVID) ARPGX2  CULTURE, BLOOD (ROUTINE X 2)  CULTURE, BLOOD (ROUTINE X 2)  MRSA NEXT GEN BY PCR, NASAL  URINE CULTURE  LIPASE, BLOOD  AMMONIA  URINALYSIS, ROUTINE W REFLEX MICROSCOPIC    EKG EKG Interpretation  Date/Time:  Tuesday September 16 2021 10:56:18 EST Ventricular Rate:  106 PR Interval:  137 QRS Duration: 97 QT Interval:  339 QTC Calculation: 451 R Axis:   51 Text Interpretation: Sinus tachycardia Non-specific ST-t changes Confirmed by Pattricia Boss [phone]) on 09/16/2021 12:47:31 PM  Radiology DG Chest Port 1 View  Result Date: 09/16/2021 CLINICAL DATA:  Possible aspiration.  Confusion. EXAM: PORTABLE CHEST 1 VIEW COMPARISON:  08/15/2019 FINDINGS: Numerous leads and wires project over the chest. Midline trachea. Normal heart size for level of inspiration. Mild right hemidiaphragm elevation. Apical lordotic positioning.  Similar appearance of relatively linear/wedge-shaped areas of bibasilar and left midlung airspace disease. IMPRESSION: Limited AP portable radiograph with low lung volumes and overlying wires and leads. Similar appearance of bilateral pulmonary opacities, favoring scarring. No new lobar consolidation. Electronically Signed   By: Abigail Miyamoto M.D.   On: 09/16/2021 11:22    Procedures Procedures    Medications Ordered in ED Medications  lactated ringers infusion (has no administration in time range)  lactated ringers bolus 1,000 mL (has no administration in time range)    And  lactated ringers bolus 1,000 mL (has no administration in time range)    And  lactated ringers bolus 1,000 mL (has no administration in time range)  metroNIDAZOLE (FLAGYL) IVPB 500 mg (has no administration in time range)  sodium chloride (PF) 0.9 % injection (has no administration in time range)  sodium chloride (PF) 0.9 % injection (has no administration in time range)  vancomycin (VANCOREADY) IVPB 2000 mg/400 mL (has no administration in time range)  hydrocortisone sodium succinate (SOLU-CORTEF) 100 MG injection 50 mg (50 mg Intravenous Given 09/16/21 1147)  lactated ringers bolus 1,000 mL (1,000 mLs Intravenous New Bag/Given 09/16/21 1149)  ceFEPIme (MAXIPIME) 2 g in sodium chloride 0.9 % 100 mL IVPB (2 g Intravenous New Bag/Given 09/16/21 1302)  oxyCODONE-acetaminophen (PERCOCET/ROXICET) 5-325 MG per tablet 2 tablet (2 tablets Oral Given 09/16/21 1412)  albuterol (PROVENTIL) (2.5 MG/3ML) 0.083% nebulizer solution 5 mg (5 mg Nebulization Given 09/16/21 1413)    ED Course/ Medical Decision Making/ A&P Clinical Course as of 09/16/21 1428  Tue Sep 16, 2021  1205 Lactic acid, plasma(!!) Elevated lactic acid noted at 2.4 Plan broad-spectrum antibiotics and additional IV fluid [DR]  1205 Lactic acid elevated Patient's blood pressures were low at 88/70 he has  received 1 L of lactated Ringer's. Broad-spectrum antibiotics  per sepsis protocol initiated as well as additional IV fluids to meet 30 cc/kg of LR to resuscitate. [DR]  1245 Chest x-Tandre Conly personally reviewed and note multiple areas of abnormal density Reviewed radiologist interpretation and they feel that this is consistent with prior and suspect scarring. [DR]  1246 Ammonia level reviewed and normal Bilirubin and liver function tests are normal Sodium is 135 [DR]  1403 Flu and COVID panel back negative for COVID and influenza AMB  [DR]  1408 CBC with Differential(!) CBC noted to have leukocytosis of 23,900 with 21,000 neutrophils at 88% [DR]  1410 Discussed with patient and he is requesting his home pain medications.  He states he takes oxycodone 5 to milligrams every 4 hours while awake.  He is prescribed 2 Percocet here. Blood pressure is improved 156 with heart rate at 101. IV infiltrated and they were unable to get CT of head with contrast. [DR]  1416 Repeat troponin downward trending with first at 2.4 and second at 2.1 [DR]    Clinical Course User Index [DR] Pattricia Boss, MD                           Medical Decision Making 66 year old male presents today with altered mental status with known metastatic prostate cancer. Question of aspiration History of adrenal insufficiency Broad differential diagnosis including metastatic disease, electrolyte abnormalities, especially hyponatremia with a history of prior hyponatremia, infection given patient is hypotensive and tachycardic, blood cultures obtained, fluid bolus and lactic acid ordered Hydrocortisone ordered in setting of adrenal insufficiency with possible infection and hypotension Rectal temp ordered Patient has CBC, electrolytes, kidney function ordered to evaluate for infection electrolyte abnormalities 1 sepsis-multiple sources possible including lungs with question of aspiration, urine pending, no obvious skin source and abdomen appears soft.  Patient is febrile and has significant  leukocytosis at 23,000. 2 confusion-patient was hypotensive and some of the confusion could be due to his hypotension.  His sodium, blood sugar, did not show any evidence of acute abnormalities that would cause confusion.  Plan to get head CT with and without contrast.  However, IV infiltrated we are unable to get with contrast.  We will proceed with CT of head without contrast. 3 patient hypotension thought to be secondary to sepsis, however he also has adrenal insufficiency and was given steroids here. 4-patient with metastatic prostate cancer. Additionally LFTs and ammonia have been ordered as patient has some yellowish cast to his skin and given his metastatic disease will need to be evaluated for liver failure CT of the brain with and without contrast are ordered to evaluate for abnormality such as bleeding and metastatic disease.  Amount and/or Complexity of Data Reviewed Independent Historian: spouse Labs: ordered. Decision-making details documented in ED Course. Radiology: ordered. ECG/medicine tests: ordered and independent interpretation performed. Decision-making details documented in ED Course.    Details: Patient remains on monitor and is in a sinus tachycardia Sats remain normal Patient is febrile at 101 Patient is receivi acetaminophen 650 Blood pressure has improved and is now hypertensive. Discussion of management or test interpretation with external provider(s): Discussed care with Dr. Marylyn Ishihara who will see for admission  Risk Prescription drug management.  Critical Care Total time providing critical care: 30-74 minutes          Final Clinical Impression(s) / ED Diagnoses Final diagnoses:  Sepsis, due to unspecified organism, unspecified whether acute organ dysfunction present (  [place])  Hypotension, unspecified hypotension type    Rx / DC Orders ED Discharge Orders     None         Pattricia Boss, MD 09/16/21 1428

## 2021-09-16 NOTE — Progress Notes (Signed)
A consult was received from an ED physician for cefepime and vancomycin per pharmacy dosing.  The patient's profile has been reviewed for ht/wt/allergies/indication/available labs.   A one time order has been placed for cefepime 2g IV x1 and vancomycin 2000mg  IV x1.  Further antibiotics/pharmacy consults should be ordered by admitting physician if indicated.                       Thank you,  Dimple Nanas, PharmD 09/16/2021 1:22 PM

## 2021-09-16 NOTE — Sepsis Progress Note (Signed)
eLink monitoring code sepsis.  

## 2021-09-16 NOTE — H&P (Signed)
History and Physical    Patient: [name] Holder TOI:[id number] DOB: July 01, 1956 DOA: 09/16/2021 DOS: the patient was seen and examined on 09/16/2021 PCP: [name], MD  Patient coming from: Home  Chief Complaint:  Chief Complaint  Patient presents with   Altered Mental Status    HPI: [name] Holder is a 66 y.o. male with medical history significant of bladder cancer/prostate cancer, chronic pain, HTN, COPD. Presenting w/ weakness and confusion. He was in his normal state of health until this morning. He woke his wife at 5am and told her he thinks he aspirated some gatorade. She thought he seemed confused and disoriented. He looked pale and seemed very weak. He needed help with walking. He took a neb, olmesartan, and his prednisone. They then came to the ED for assistance. They deny any sick contacts. He did not have any chest pain, palpitations, N/V/D. He denies any other aggravating or alleviating factors.   Review of Systems: As mentioned in the history of present illness. All other systems reviewed and are negative. Past Medical History:  Diagnosis Date   Allergic rhinitis    At risk for sleep apnea    STOP-BANG SCORE = 5    (routed to pt's pcp 09-08-2018)   Bladder cancer ([place]) dx'd 2020   Chronic constipation    Chronic low back pain    Chronic pain    DOE (dyspnea on exertion)    History of kidney stones    History of recent pneumonia 08/10/2018   acute bronchopneumonia;  follow-up cxr 08-28-2018 in epic   History of vertebral compression fracture    T10 and T12 s/p kyphoplasty   Hx of colonic polyps    Hyperplasia of prostate with lower urinary tract symptoms (LUTS)    Hypertension    Mild persistent asthma    followed by pcp   Numbness in both legs    secondary to a fall, per pt has had work-up and couldn't find anything   Osteopenia    Prostate cancer metastatic to pelvis [facility]) urologist-- dr borden/  oncologist -- dr Alen Blew   dx 2014 advanced prostate  cancer with pelvid adenopathy,  Stage T2c, Gleason 7;  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   Wears glasses    Past Surgical History:  Procedure Laterality Date   COLONOSCOPY     IR KYPHO EA ADDL LEVEL THORACIC OR LUMBAR  08/17/2018   IR KYPHO EA ADDL LEVEL THORACIC OR LUMBAR  08/26/2018   IR KYPHO LUMBAR INC FX REDUCE BONE BX UNI/BIL CANNULATION INC/IMAGING  08/26/2018   IR KYPHO THORACIC WITH BONE BIOPSY  08/17/2018   IR RADIOLOGIST EVAL & MGMT  08/11/2018   IR RADIOLOGIST EVAL & MGMT  08/24/2018   LYMPHADENECTOMY Left 01/05/2013   Procedure: ROBOTIC LYMPHADENECTOMY;  Surgeon: Dutch Gray, MD;  Location: [facility];  Service: Urology;  Laterality: Left;   PROSTATE BIOPSY     11/12 positive biopsies   ROTATOR CUFF REPAIR Left 2012   TRANSURETHRAL RESECTION OF BLADDER TUMOR N/A 09/15/2018   Procedure: TRANSURETHRAL RESECTION OF BLADDER TUMOR (TURBT)WITH CYSTOSCOPY/ POST OPERATIVE INSTILLATION OF GEMCITABINE;  Surgeon: Raynelle Bring, MD;  Location: [facility];  Service: Urology;  Laterality: N/A;  GENERAL ANESTHESIA WITH PARALYSIS   UMBILICAL HERNIA REPAIR  09-02-2001   dr t. price _0    Social History:  reports that he quit smoking about 9 years ago. His smoking use included cigarettes. He has a 25.00 pack-year smoking history. He has never used smokeless tobacco.  He reports that he does not currently use alcohol after a past usage of about 2.0 standard drinks per week. He reports that he does not use drugs.  Allergies  Allergen Reactions   Amlodipine Swelling    edema   Tetracycline Rash    Family History  Problem Relation Age of Onset   Arthritis Mother    Diabetes Mother    Hypertension Mother    Hyperlipidemia Mother    Hypertension Father    Prostate cancer Father        radiation in mid 65s   Heart attack Father    Cancer Sister        precancerous colon polyps, 6 in colon removed   Stroke Maternal Grandfather    Other Neg Hx        hyponatremia    Prior to Admission medications    Medication Sig Start Date End Date Taking? Authorizing Provider  abiraterone acetate (ZYTIGA) 250 MG tablet Take 4 tablets (1,000 mg total) by mouth daily. 11/07/20  Yes Wyatt Portela, MD  acetaminophen (TYLENOL) 325 MG tablet Take 325 mg by mouth daily as needed for fever.   Yes [provider]  albuterol (PROVENTIL) (2.5 MG/3ML) 0.083% nebulizer solution NEBULIZE 1 VIAL EVERY 6-8 HOURS AS NEEDED FOR WHEEZING OR SHORTNESS OF BREATH 08/08/21  Yes [name], MD  albuterol (VENTOLIN HFA) 108 (90 Base) MCG/ACT inhaler Inhale 2 puffs into the lungs every 6 (six) hours as needed for wheezing. 2 PUFFS EVERY 6 HOURS AS NEEDED FOR WHEEZING 12/25/20  Yes Icard, Bradley L, DO  azelastine (ASTELIN) 0.1 % nasal spray Place 2 puffs into the nose in the morning and at bedtime. 06/09/21  Yes [name], MD  CALCIUM-VITAMIN D PO Take 1 tablet by mouth 2 (two) times daily.    Yes [provider]  carvedilol (COREG) 3.125 MG tablet TAKE (1) TABLET TWICE A DAY WITH MEALS (BREAKFAST AND SUPPER) 04/29/21  Yes [name], MD  denosumab (PROLIA) 60 MG/ML SOSY injection Inject 60 mg into the skin every 6 (six) months.   Yes [provider]  esomeprazole (NEXIUM) 20 MG capsule Take 20 mg by mouth daily at 12 noon.   Yes [provider]  fluticasone (FLONASE) 50 MCG/ACT nasal spray Place 2 sprays into both nostrils daily. 09/30/20  Yes [name], MD  Fluticasone-Umeclidin-Vilant (TRELEGY ELLIPTA) 100-62.5-25 MCG/INH AEPB INHALE 1 PUFF INTO LUNGS DAILY 12/25/20  Yes Icard, Bradley L, DO  furosemide (LASIX) 20 MG tablet Take 1 tablet (20 mg total) by mouth daily. 04/29/21  Yes [name], MD  Leuprolide Acetate (LUPRON IJ) Inject 1 Dose as directed every 4 (four) months. This is administered by [facility]   Yes [provider]  Multiple Vitamins-Minerals (CENTRUM SILVER 50+MEN PO) Take 1 tablet by mouth daily.   Yes [provider]  NARCAN 4 MG/0.1ML  LIQD nasal spray kit Place 0.4 mg into the nose once as needed (overdose).  08/18/18  Yes [provider]  olmesartan (BENICAR) 40 MG tablet Take 1 tablet (40 mg total) by mouth daily. 04/29/21  Yes [name], MD  oxyCODONE (OXY IR/ROXICODONE) 5 MG immediate release tablet TAKE ONE TABLET EVERY 4 HOURS AS NEEDED Patient taking differently: Take 5 mg by mouth every 4 (four) hours. 09/16/21  Yes Wyatt Portela, MD  polyethylene glycol powder (GLYCOLAX/MIRALAX) 17 GM/SCOOP powder Take 17 g (1 capful) by mouth daily as directed. 06/05/21  Yes Shadad,  Mathis Dad, MD  predniSONE (DELTASONE) 5 MG tablet TAKE 1 TABLET 2 TIMES A DAY 06/23/21  Yes Shadad, Mathis Dad, MD  sodium chloride 1 g tablet Take 1 g by mouth 2 (two) times daily with a meal.   Yes [provider]  solifenacin (VESICARE) 5 MG tablet Take 5 mg by mouth daily. 08/11/19  Yes [provider]  Specialty Vitamins Products (VISTA ADVANCED CAROTENOID) CAPS Take 1 capsule by mouth every evening.   Yes [provider]  vitamin C (ASCORBIC ACID) 500 MG tablet Take 500 mg by mouth every evening.   Yes [provider]  vitamin E 180 MG (400 UNITS) capsule Take 400 Units by mouth every evening.   Yes [provider]  polyethylene glycol (MIRALAX) 17 g packet Take 17 g by mouth daily. Patient not taking: Reported on 09/16/2021 09/04/20   Wyatt Portela, MD    Physical Exam: Vitals:   09/16/21 1245 09/16/21 1305 09/16/21 1400 09/16/21 1455  BP: 121/71 (!) 156/136 134/82 (!) 161/146  Pulse: (!) 104 (!) 101 100 95  Resp: (!) _0 (!) 24  Temp:      TempSrc:      SpO2: 98% 96% 94% 95%  Weight:      Height:       General: 66 y.o. male resting in bed in NAD Eyes: PERRL, normal sclera ENMT: Nares patent w/o discharge, orophaynx clear, dentition normal, ears w/o discharge/lesions/ulcers Neck: Supple, trachea midline Cardiovascular: tachy, +S1, S2, no m/g/r, equal pulses throughout Respiratory:  CTABL, no w/r/r, normal WOB GI: BS+, mild distention, NT, no masses noted, no organomegaly noted MSK: No e/c/c Neuro: A&O x 3, no focal deficits Psyc: Appropriate interaction and affect, calm/cooperative   Data Reviewed:  BUN 25 Scr 1.17 Lactic acid 2.4 -> 2.1 WBC 23.9 Hgb 12.0  CTH: No evidence of acute intracranial abnormality. No appreciable intracranial metastatic disease on this non-contrast head CT. However, please note a brain MRI with contrast would have greater sensitivity. Minimal chronic small vessel ischemic changes within the cerebral white matter. Mild generalized cerebral and cerebellar atrophy. CXR: Limited AP portable radiograph with low lung volumes and overlying wires and leads. Similar appearance of bilateral pulmonary opacities, favoring scarring. No new lobar consolidation.  Assessment and Plan: No notes have been filed under this hospital service. Service: Hospitalist Sepsis of unknown source Acute metabolic encephalopathy     - admit to inpt, progressive     - continue broad spec abx     - Follow Bld Cx     - UA negative     - CXR/CTH no acute processes noted     - CT ab/pelvis is pending     - continue stress dose steroids as well     - COVID/flu negative     - mentation is returning to baseline  Metastatic prostate cancer     - follows w/ Dr. Alen Blew; on chemo; spoke with onco, added to service     - the chemo he is taking can lead to adrenal insuffiencey; continue steroids for now  Hx of SIADH     - continue home regimen  Hx of HTN     - hold home regimen d/t hypotension  COPD/Asthma     - continue home regimen  Advance Care Planning:   Code Status: DNI  Consults: None  Family Communication: w/ wife at bedside  Severity of Illness: The appropriate patient status for this patient is INPATIENT. Inpatient  status is judged to be reasonable and necessary in order to provide the required intensity of service to ensure the patient's safety. The  patient's presenting symptoms, physical exam findings, and initial radiographic and laboratory data in the context of their chronic comorbidities is felt to place them at high risk for further clinical deterioration. Furthermore, it is not anticipated that the patient will be medically stable for discharge from the hospital within 2 midnights of admission.   * I certify that at the point of admission it is my clinical judgment that the patient will require inpatient hospital care spanning beyond 2 midnights from the point of admission due to high intensity of service, high risk for further deterioration and high frequency of surveillance required.*  Author: Jonnie Finner, DO 09/16/2021 3:11 PM  For on call review [email or web address].

## 2021-09-16 NOTE — ED Notes (Signed)
Dinner provided.

## 2021-09-16 NOTE — ED Notes (Signed)
Pt noted with IV infiltration on both left and right. IV discontinued and removed

## 2021-09-16 NOTE — ED Triage Notes (Signed)
Pt arrived via POV with spouse. Per spouse, pt aspirated last night while sleeping, states that is not uncommon but he was confused this morning. Pt ao x2 in triage, believes it is the 1950s. Able to follow commands.

## 2021-09-17 DIAGNOSIS — J189 Pneumonia, unspecified organism: Secondary | ICD-10-CM | POA: Diagnosis present

## 2021-09-17 DIAGNOSIS — I10 Essential (primary) hypertension: Secondary | ICD-10-CM

## 2021-09-17 DIAGNOSIS — D72829 Elevated white blood cell count, unspecified: Secondary | ICD-10-CM

## 2021-09-17 DIAGNOSIS — J4520 Mild intermittent asthma, uncomplicated: Secondary | ICD-10-CM

## 2021-09-17 DIAGNOSIS — D638 Anemia in other chronic diseases classified elsewhere: Secondary | ICD-10-CM

## 2021-09-17 DIAGNOSIS — E871 Hypo-osmolality and hyponatremia: Secondary | ICD-10-CM

## 2021-09-17 DIAGNOSIS — J690 Pneumonitis due to inhalation of food and vomit: Secondary | ICD-10-CM | POA: Diagnosis present

## 2021-09-17 DIAGNOSIS — G9341 Metabolic encephalopathy: Secondary | ICD-10-CM

## 2021-09-17 LAB — CBC
HCT: 32.6 % — ABNORMAL LOW (ref 39.0–52.0)
HCT: 32.6 % — ABNORMAL LOW (ref 39.0–52.0)
Hemoglobin: 10.8 g/dL — ABNORMAL LOW (ref 13.0–17.0)
Hemoglobin: 10.9 g/dL — ABNORMAL LOW (ref 13.0–17.0)
MCH: 29.2 pg (ref 26.0–34.0)
MCH: 29.2 pg (ref 26.0–34.0)
MCHC: 33.1 g/dL (ref 30.0–36.0)
MCHC: 33.4 g/dL (ref 30.0–36.0)
MCV: 87.4 fL (ref 80.0–100.0)
MCV: 88.1 fL (ref 80.0–100.0)
Platelets: 195 10*3/uL (ref 150–400)
Platelets: 205 10*3/uL (ref 150–400)
RBC: 3.7 MIL/uL — ABNORMAL LOW (ref 4.22–5.81)
RBC: 3.73 MIL/uL — ABNORMAL LOW (ref 4.22–5.81)
RDW: 13.9 % (ref 11.5–15.5)
RDW: 13.9 % (ref 11.5–15.5)
WBC: 15.2 10*3/uL — ABNORMAL HIGH (ref 4.0–10.5)
WBC: 15.5 10*3/uL — ABNORMAL HIGH (ref 4.0–10.5)
nRBC: 0 % (ref 0.0–0.2)
nRBC: 0 % (ref 0.0–0.2)

## 2021-09-17 LAB — MRSA NEXT GEN BY PCR, NASAL: MRSA by PCR Next Gen: NOT DETECTED

## 2021-09-17 LAB — COMPREHENSIVE METABOLIC PANEL
ALT: 18 U/L (ref 0–44)
AST: 15 U/L (ref 15–41)
Albumin: 3.1 g/dL — ABNORMAL LOW (ref 3.5–5.0)
Alkaline Phosphatase: 41 U/L (ref 38–126)
Anion gap: 5 (ref 5–15)
BUN: 16 mg/dL (ref 8–23)
CO2: 21 mmol/L — ABNORMAL LOW (ref 22–32)
Calcium: 8.4 mg/dL — ABNORMAL LOW (ref 8.9–10.3)
Chloride: 107 mmol/L (ref 98–111)
Creatinine, Ser: 0.73 mg/dL (ref 0.61–1.24)
GFR, Estimated: >60 mL/min (ref >60)
Glucose, Bld: 124 mg/dL — ABNORMAL HIGH (ref 70–99)
Potassium: 3.5 mmol/L (ref 3.5–5.1)
Sodium: 133 mmol/L — ABNORMAL LOW (ref 135–145)
Total Bilirubin: 0.6 mg/dL (ref 0.3–1.2)
Total Protein: 5.5 g/dL — ABNORMAL LOW (ref 6.5–8.1)

## 2021-09-17 LAB — HIV ANTIBODY (ROUTINE TESTING W REFLEX): HIV Screen 4th Generation wRfx: NONREACTIVE

## 2021-09-17 LAB — CORTISOL-AM, BLOOD: Cortisol - AM: 21.5 ug/dL (ref 6.7–22.6)

## 2021-09-17 LAB — CREATININE, SERUM
Creatinine, Ser: 0.7 mg/dL (ref 0.61–1.24)
GFR, Estimated: >60 mL/min (ref >60)

## 2021-09-17 LAB — GLUCOSE, CAPILLARY: Glucose-Capillary: 102 mg/dL — ABNORMAL HIGH (ref 70–99)

## 2021-09-17 LAB — PROTIME-INR
INR: 1.1 (ref 0.8–1.2)
Prothrombin Time: 13.9 seconds (ref 11.4–15.2)

## 2021-09-17 LAB — PROCALCITONIN: Procalcitonin: 1.25 ng/mL

## 2021-09-17 LAB — LACTIC ACID, PLASMA: Lactic Acid, Venous: 0.9 mmol/L (ref 0.5–1.9)

## 2021-09-17 MED ORDER — SODIUM CHLORIDE 0.9 % IV SOLN
1.0000 g | INTRAVENOUS | Status: DC
Start: 2021-09-17 — End: 2021-09-20
  Administered 2021-09-17 – 2021-09-19 (×3): 1 g via INTRAVENOUS
  Filled 2021-09-17 (×4): qty 10

## 2021-09-17 MED ORDER — AZITHROMYCIN 250 MG PO TABS
500.0000 mg | ORAL_TABLET | Freq: Every day | ORAL | Status: DC
  Administered 2021-09-17 – 2021-09-20 (×4): 500 mg via ORAL
  Filled 2021-09-17 (×4): qty 2

## 2021-09-17 MED ORDER — ALBUTEROL SULFATE HFA 108 (90 BASE) MCG/ACT IN AERS
2.0000 | INHALATION_SPRAY | RESPIRATORY_TRACT | Status: DC | PRN
  Administered 2021-09-18 – 2021-09-20 (×10): 2 via RESPIRATORY_TRACT
  Filled 2021-09-17: qty 6.7

## 2021-09-17 MED ORDER — ALBUTEROL SULFATE (2.5 MG/3ML) 0.083% IN NEBU
2.5000 mg | INHALATION_SOLUTION | RESPIRATORY_TRACT | Status: DC | PRN
  Administered 2021-09-17 – 2021-09-18 (×3): 2.5 mg via RESPIRATORY_TRACT
  Filled 2021-09-17 (×3): qty 3

## 2021-09-17 MED ORDER — ZINC OXIDE 40 % EX OINT
TOPICAL_OINTMENT | Freq: Three times a day (TID) | CUTANEOUS | Status: DC
Start: 2021-09-17 — End: 2021-09-20
  Administered 2021-09-19 (×2): 1 via TOPICAL
  Filled 2021-09-17: qty 57

## 2021-09-17 MED ORDER — ORAL CARE MOUTH RINSE
15.0000 mL | Freq: Two times a day (BID) | OROMUCOSAL | Status: DC
  Administered 2021-09-17 – 2021-09-20 (×7): 15 mL via OROMUCOSAL

## 2021-09-17 MED ORDER — VANCOMYCIN HCL 1750 MG/350ML IV SOLN: 1750.0000 mg | INTRAVENOUS | Status: DC

## 2021-09-17 MED ORDER — SODIUM CHLORIDE 0.9 % IV SOLN: INTRAVENOUS | Status: DC

## 2021-09-17 NOTE — TOC Progression Note (Signed)
Transition of Care [facility]) - Progression Note    Patient Details  Name: ARIES TOWNLEY MRN: [id number] Date of Birth: 1955/09/01  Transition of [facility]) CM/SW Contact  Purcell Mouton, RN Phone Number: 09/17/2021, 3:24 PM  Clinical Narrative:      Transition of Care [facility]) Screening Note   Patient Details  Name: GAL FELDHAUS Date of Birth: May 06, 1956   Transition of Care [facility]) CM/SW Contact:    Purcell Mouton, RN Phone Number: 09/17/2021, 3:24 PM    Transition of Care Department [facility]) has reviewed patient and no TOC needs have been identified at this time. We will continue to monitor patient advancement through interdisciplinary progression rounds. If new patient transition needs arise, please place a TOC consult.         Expected Discharge Plan and Services                                                 Social Determinants of Health (SDOH) Interventions    Readmission Risk Interventions Readmission Risk Prevention Plan 08/18/2019  Transportation Screening Complete  Medication Review ([place]) Complete  PCP or Specialist appointment within 3-5 days of discharge Complete  HRI or [place] Complete  SW Recovery Care/Counseling Consult Complete  [place] Not Applicable  Some recent data might be hidden

## 2021-09-17 NOTE — Evaluation (Signed)
Physical Therapy Evaluation Only Patient Details Name: [name] Holder MRN: [id number] DOB: 10-28-1955 Today's Date: 09/17/2021  History of Present Illness  [name] Holder is a 66 yo male admitted with sepsis. Head CT negative for acute intracranial abnormality. CT of the abdomen and pelvis showed possible right multilobar pneumonia. PMH: bladder cancer, prostate cancer with mets to pelvis, chronic pain, HTN, COPD.   Clinical Impression  Pt ind with rollator at baseline, has w/c for longer community distances, spouse at home, ramp to enter, has all DME needs. Pt ambulates in hallway with RW, mod Ind and gait pattern WFL, reports "asthma" limiting tolerance, no unsteadiness or overt LOB. Pt educated on ambulating to restroom and in hallway daily with nursing and reports has already been up around the room with nursing multiple times today. Spouse in room reports pt cognitively and functionally appears to be back to baseline. No acute or f/u needs identified, will sign off at this time.       Recommendations for follow up therapy are one component of a multi-disciplinary discharge planning process, led by the attending physician.  Recommendations may be updated based on patient status, additional functional criteria and insurance authorization.  Follow Up Recommendations No PT follow up    Assistance Recommended at Discharge None  Patient can return home with the following       Equipment Recommendations None recommended by PT  Recommendations for Other Services       Functional Status Assessment Patient has not had a recent decline in their functional status     Precautions / Restrictions Precautions Precautions: None Restrictions Weight Bearing Restrictions: No      Mobility  Bed Mobility Overal bed mobility: Modified Independent  General bed mobility comments: slightly increased time    Transfers Overall transfer level: Modified independent Equipment used: Rolling  walker (2 wheels)   Ambulation/Gait Ambulation/Gait assistance: Modified independent (Device/Increase time) Gait Distance (Feet): 120 Feet Assistive device: Rolling walker (2 wheels) Gait Pattern/deviations: WFL(Within Functional Limits), Trunk flexed Gait velocity: slightly decreased  General Gait Details: gait WFL, flexed trunk that spouse reports "looks normal", step through pattern without LOB, reports some asthma symptoms limiting him but able to continue with conversation  Stairs            Wheelchair Mobility    Modified Rankin (Stroke Patients Only)       Balance Overall balance assessment: No apparent balance deficits (not formally assessed)       Pertinent Vitals/Pain Pain Assessment Pain Assessment: No/denies pain    Home Living Family/patient expects to be discharged to:: Private residence Living Arrangements: Spouse/significant other Available Help at Discharge: Family;Available 24 hours/day Type of Home: House Home Access: Ramped entrance       Home Layout: One level Home Equipment: Conservation officer, nature (2 wheels);Rollator (4 wheels);BSC/3in1;Wheelchair - manual Additional Comments: pt able to answer all questions, spouse in room confirms responses    Prior Function Prior Level of Function : Independent/Modified Independent  Mobility Comments: pt ind with rollator in home and limited community, also has w/c for longer distances ADLs Comments: pt ind with ADLs/IADLs     Hand Dominance        Extremity/Trunk Assessment   Upper Extremity Assessment Upper Extremity Assessment: Overall WFL for tasks assessed    Lower Extremity Assessment Lower Extremity Assessment: Overall WFL for tasks assessed (AROM WNL, strength grossly 4/5, denies numbness/tingling)    Cervical / Trunk Assessment Cervical / Trunk Assessment: Kyphotic (multiple kyphoplasties, spouse reports baseline  posture)  Communication   Communication: No difficulties  Cognition  Arousal/Alertness: Awake/alert Behavior During Therapy: WFL for tasks assessed/performed Overall Cognitive Status: Within Functional Limits for tasks assessed  General Comments: spouse in room reports pt back to baseline cognitively        General Comments      Exercises     Assessment/Plan    PT Assessment Patient does not need any further PT services  PT Problem List         PT Treatment Interventions      PT Goals (Current goals can be found in the Care Plan section)  Acute Rehab PT Goals Patient Stated Goal: agreeable to ambulate with PT PT Goal Formulation: All assessment and education complete, DC therapy    Frequency       Co-evaluation               AM-PAC PT "6 Clicks" Mobility  Outcome Measure Help needed turning from your back to your side while in a flat bed without using bedrails?: None Help needed moving from lying on your back to sitting on the side of a flat bed without using bedrails?: None Help needed moving to and from a bed to a chair (including a wheelchair)?: None Help needed standing up from a chair using your arms (e.g., wheelchair or bedside chair)?: None Help needed to walk in hospital room?: None Help needed climbing 3-5 steps with a railing? : None 6 Click Score: 24    End of Session   Activity Tolerance: Patient tolerated treatment well Patient left: in bed;with call bell/phone within reach;with family/visitor present Nurse Communication: Mobility status PT Visit Diagnosis: Other abnormalities of gait and mobility (R26.89)    Time: 1359-1414 PT Time Calculation (min) (ACUTE ONLY): 15 min   Charges:   PT Evaluation $PT Eval Low Complexity: 1 Low           Tori Datron Brakebill PT, DPT 09/17/21, 3:04 PM

## 2021-09-17 NOTE — Assessment & Plan Note (Addendum)
Improved

## 2021-09-17 NOTE — Plan of Care (Signed)
°  Problem: Clinical Measurements: Goal: Diagnostic test results will improve Outcome: Progressing   Problem: Activity: Goal: Risk for activity intolerance will decrease Outcome: Progressing   Problem: Nutrition: Goal: Adequate nutrition will be maintained Outcome: Progressing   Problem: Coping: Goal: Level of anxiety will decrease Outcome: Progressing   Problem: Pain Managment: Goal: General experience of comfort will improve Outcome: Progressing

## 2021-09-17 NOTE — Progress Notes (Signed)
Pharmacy Antibiotic Note  [name] Holder is a 66 y.o. male admitted on 09/16/2021 with sepsis, pneumonia.  Pharmacy has been consulted for cefepime and vancomycin dosing.  Plan: Increase to vancomycin 1750 mg IV every 24 hrs  Continue cefepime 2 g IV every 8 hours Continue metronidazole per MD Measure Vanc levels as needed.  Goal AUC = 400 - 550. Follow up renal function, culture results, and clinical course.    Height: 5\' 8"  (172.7 cm) Weight: 96.6 kg (213 lb) IBW/kg (Calculated) : 68.4  Temp (24hrs), Avg:98.9 F (37.2 C), Min:97.5 F (36.4 C), Max:101 F (38.3 C)  Recent Labs  Lab 09/16/21 1048 09/16/21 1116 09/16/21 1247 09/16/21 1248 09/17/21 0404  WBC  --   --  23.9*  --  15.5*   15.2*  CREATININE  --  1.17  --   --  0.73   0.70  LATICACIDVEN 2.4*  --   --  2.1* 0.9     Estimated Creatinine Clearance: 101 mL/min (by C-G formula based on SCr of 0.7 mg/dL).    Allergies  Allergen Reactions   Amlodipine Swelling    edema   Tetracycline Rash    Antimicrobials this admission:  2/21 Cefepime >> 2/21 Metronidazole >>  2/21 Vancomycin >>     Microbiology results:  2/21 Covid neg; influenza neg 2/21 BCx: 2/21 MRSA PCR:  2/21 UCx:   Thank you for allowing pharmacy to be a part of this patients care.  Gretta Arab PharmD, BCPS Clinical Pharmacist [facility] main pharmacy [phone] 09/17/2021 8:07 AM

## 2021-09-17 NOTE — Assessment & Plan Note (Addendum)
Resolved

## 2021-09-17 NOTE — Assessment & Plan Note (Signed)
-   Currently stable.  Continue home regimen.

## 2021-09-17 NOTE — Assessment & Plan Note (Addendum)
-   CT of the abdomen and pelvis showed possible right multilobar pneumonia -Currently on Rocephin and Zithromax.   -Diet as per SLP recommendations -COVID-19/influenza testing negative on presentation. -Respiratory status stable.  Cultures negative so far.  Discharge home today on oral Ceftin.  Outpatient follow-up with PCP.

## 2021-09-17 NOTE — Assessment & Plan Note (Addendum)
-   Presented with acute metabolic encephalopathy, lactic acidosis and possible pneumonia. -Cultures negative so far.  Antibiotic plan as below. -Sepsis has resolved. Off IV fluids today.

## 2021-09-17 NOTE — Assessment & Plan Note (Addendum)
-   Possibly from above.  Mental status has much improved.  Monitor mental status. -PT recommends no PT follow-up.

## 2021-09-17 NOTE — Evaluation (Signed)
Clinical/Bedside Swallow Evaluation Patient Details  Name: [name] Holder MRN: [id number] Date of Birth: 1955-11-26  Today's Date: 09/17/2021 Time: SLP Start Time (ACUTE ONLY): 5366 SLP Stop Time (ACUTE ONLY): 4403 SLP Time Calculation (min) (ACUTE ONLY): 51 min  Past Medical History:  Past Medical History:  Diagnosis Date   Allergic rhinitis    At risk for sleep apnea    STOP-BANG SCORE = 5    (routed to pt's pcp 09-08-2018)   Bladder cancer ([place]) dx'd 2020   Chronic constipation    Chronic low back pain    Chronic pain    DOE (dyspnea on exertion)    History of kidney stones    History of recent pneumonia 08/10/2018   acute bronchopneumonia;  follow-up cxr 08-28-2018 in epic   History of vertebral compression fracture    T10 and T12 s/p kyphoplasty   Hx of colonic polyps    Hyperplasia of prostate with lower urinary tract symptoms (LUTS)    Hypertension    Mild persistent asthma    followed by pcp   Numbness in both legs    secondary to a fall, per pt has had work-up and couldn't find anything   Osteopenia    Prostate cancer metastatic to pelvis [facility]) urologist-- dr borden/  oncologist -- dr Alen Blew   dx 2014 advanced prostate cancer with pelvid adenopathy,  Stage T2c, Gleason 7;  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   Wears glasses    Past Surgical History:  Past Surgical History:  Procedure Laterality Date   COLONOSCOPY     IR KYPHO EA ADDL LEVEL THORACIC OR LUMBAR  08/17/2018   IR KYPHO EA ADDL LEVEL THORACIC OR LUMBAR  08/26/2018   IR KYPHO LUMBAR INC FX REDUCE BONE BX UNI/BIL CANNULATION INC/IMAGING  08/26/2018   IR KYPHO THORACIC WITH BONE BIOPSY  08/17/2018   IR RADIOLOGIST EVAL & MGMT  08/11/2018   IR RADIOLOGIST EVAL & MGMT  08/24/2018   LYMPHADENECTOMY Left 01/05/2013   Procedure: ROBOTIC LYMPHADENECTOMY;  Surgeon: Dutch Gray, MD;  Location: [facility];  Service: Urology;  Laterality: Left;   PROSTATE BIOPSY     11/12 positive biopsies   ROTATOR CUFF REPAIR Left 2012    TRANSURETHRAL RESECTION OF BLADDER TUMOR N/A 09/15/2018   Procedure: TRANSURETHRAL RESECTION OF BLADDER TUMOR (TURBT)WITH CYSTOSCOPY/ POST OPERATIVE INSTILLATION OF GEMCITABINE;  Surgeon: Raynelle Bring, MD;  Location: [facility];  Service: Urology;  Laterality: N/A;  GENERAL ANESTHESIA WITH PARALYSIS   UMBILICAL HERNIA REPAIR  09-02-2001   dr t. price @[facility]    HPI:  per MD notes " 66 y.o. male with medical history significant of bladder cancer/prostate cancer, chronic pain, HTN, COPD presented with weakness and confusion.  On presentation, lactic acid was above 2 with white count of 23.9.  CT of the head was negative for acute intracranial abnormality.  Chest x-ray showed similar appearance of bilateral pulmonary opacities, favoring scarring; no new lobar consolidation.  He was admitted for sepsis of unknown source with acute metabolic encephalopathy and started on antibiotics."  Sawllow eval ordered. Pt see in 2021 for eval due to his report of gagging with po.  Pt also has compression fx hx = new 11/2020.  Per review of chart, pt possibly aspirated overnight prior to admit.  Brain imaging negative for mets.    Assessment / Plan / Recommendation  Clinical Impression  Patient seen for extensive clinical evaluation including establishing baseline swallow function.   Patient familiar to this SLP when seen for  clinical swallow evaluation 07/2019 - when pt was reporting gagging with intake.  Pt did not pass 3 ounce Yale due to requiring rest break or having subtle cough within 1 minute after swallowing.  Pt reports occasionally sensing pill sticking in throat requiring him to propel into oral cavity and re-sawllow.  Pt states when he re-swallows the pill is "viable" - causing SLP to question tongue base/epiglottic deflection deficit - concern for potential pharyngeal dysphagia.  Suspect pt's largest issue is his known reflux, wife report pt waking in the middle with coughing on potential gatorade - ? reflux..    Pt had  endoscopy late January 2023 at [place] clinic with [facility] and which per wife (via speaker phone) this showed "evidence of prior reflux" but otherwise negative.   Per pt and wife they did not inform GI Md re: his waking and "coughing" episodes prior to his endoscopy.  Pt indicate he does not know for certain if middle of the night occurence are "reflux" or expectorating congestion from his airway.  Pt does report he eats late consistently - advised against said behavior.  MBS to be conducted to rule out overt pharyngeal dysphagia/silent aspiration of thin given his COPD.  Requested pt do his IS, but not AFTER eating due to know reflux.  Of note, pt is taking same dosage and Nexium for "several years" per his statement.  Three pillars of aspiration pna reviewed including compromised immune system, dysphagia and dentition condition and coorelation to aspiration pna. Pt presents with 2 of said risk factors. SLP Visit Diagnosis: Dysphagia, unspecified (R13.10);Dysphagia, pharyngeal phase (R13.13);Dysphagia, pharyngoesophageal phase (R13.14)    Aspiration Risk  Moderate aspiration risk    Diet Recommendation Regular;Thin liquid   Liquid Administration via: Cup;Straw Medication Administration: Whole meds with liquid Supervision: Patient able to self feed Compensations: Minimize environmental distractions;Slow rate;Small sips/bites Postural Changes: Remain upright for at least 30 minutes after po intake;Seated upright at 90 degrees    Other  Recommendations Recommended Consults: Consider esophageal assessment (after MBS) Oral Care Recommendations: Oral care BID    Recommendations for follow up therapy are one component of a multi-disciplinary discharge planning process, led by the attending physician.  Recommendations may be updated based on patient status, additional functional criteria and insurance authorization.  Follow up Recommendations Other (comment) (TBD)      Assistance Recommended at Discharge  Frequent or constant Supervision/AssistanceTBD  Functional Status Assessment Patient has had a recent decline in their functional status and demonstrates the ability to make significant improvements in function in a reasonable and predictable amount of time.  Frequency and Duration min 1 x/week  1 week       Prognosis Prognosis for Safe Diet Advancement: Fair      Swallow Study   General Date of Onset: 09/17/21 HPI: per MD notes " 67 y.o. male with medical history significant of bladder cancer/prostate cancer, chronic pain, HTN, COPD presented with weakness and confusion.  On presentation, lactic acid was above 2 with white count of 23.9.  CT of the head was negative for acute intracranial abnormality.  Chest x-ray showed similar appearance of bilateral pulmonary opacities, favoring scarring; no new lobar consolidation.  He was admitted for sepsis of unknown source with acute metabolic encephalopathy and started on antibiotics."  Sawllow eval ordered. Pt see in 2021 for eval due to his report of gagging with po.  Pt also has compression fx hx = new 11/2020.  Per review of chart, pt possibly aspirated overnight prior  to admit.  Brain imaging negative for mets. Previous Swallow Assessment: 08/18/2019 functional oropharyngeal swallow ability, deferred pt to md re: his frequent gagging with po at that time Diet Prior to this Study: Regular;Thin liquids Temperature Spikes Noted: No Respiratory Status: Room air History of Recent Intubation: No Behavior/Cognition: Alert;Cooperative Oral Cavity Assessment: Within Functional Limits Oral Care Completed by SLP: No Oral Cavity - Dentition: Adequate natural dentition Vision: Functional for self-feeding Self-Feeding Abilities: Able to feed self Patient Positioning: Upright in bed Baseline Vocal Quality: Normal Volitional Cough: Strong Volitional Swallow: Able to elicit    Oral/Motor/Sensory Function Overall Oral Motor/Sensory Function: Within  functional limits   Ice Chips Ice chips: Not tested   Thin Liquid Other Comments: Pt did not pass3 ounce Yale water test twice - first time due to subtle cough post-swallow, 2nd attempt due to requiring rest break; Small single sips tolerated well without indication of aspiration    Nectar Thick Nectar Thick Liquid: Not tested   Honey Thick Honey Thick Liquid: Not tested   Puree Puree: Not tested   Solid     Solid: Within functional limits Presentation: Self Fed      Macario Golds 09/17/2021,5:21 PM  Kathleen Lime, MS [place] Office [phone] Pager [phone]

## 2021-09-17 NOTE — Assessment & Plan Note (Addendum)
-   Follows with Dr. Alen Blew.  Currently on chemotherapy.  Outpatient follow-up with oncology. -Currently on stress dose steroids as well.  Resume home dose of steroid on discharge.

## 2021-09-17 NOTE — Assessment & Plan Note (Addendum)
-   Blood pressure intermittently on the higher side.  Resume home regimen.  Outpatient follow-up.

## 2021-09-17 NOTE — Assessment & Plan Note (Signed)
-   Outpatient follow-up °

## 2021-09-17 NOTE — Hospital Course (Addendum)
66 y.o. male with medical history significant of bladder cancer/prostate cancer, chronic pain, HTN, COPD presented with weakness and confusion.  On presentation, lactic acid was above 2 with white count of 23.9.  CT of the head was negative for acute intracranial abnormality.  Chest x-ray showed similar appearance of bilateral pulmonary opacities, favoring scarring; no new lobar consolidation.  He was admitted for sepsis of unknown source with acute metabolic encephalopathy and started on antibiotics.  Subsequently, CT of the abdomen showed possible right multilobar pneumonia.  Antibiotics were switched to Rocephin and Zithromax.  During the hospitalization, his respiratory status has improved.  He is currently on room air and feels okay to go home today.  He will be discharged home on oral Ceftin.

## 2021-09-17 NOTE — Progress Notes (Signed)
Progress Note   Patient: [name] Holder:[id number] DOB: November 10, 1955 DOA: 09/16/2021     1 DOS: the patient was seen and examined on 09/17/2021   Brief hospital course: 66 y.o. male with medical history significant of bladder cancer/prostate cancer, chronic pain, HTN, COPD presented with weakness and confusion.  On presentation, lactic acid was above 2 with white count of 23.9.  CT of the head was negative for acute intracranial abnormality.  Chest x-ray showed similar appearance of bilateral pulmonary opacities, favoring scarring; no new lobar consolidation.  He was admitted for sepsis of unknown source with acute metabolic encephalopathy and started on antibiotics.  Assessment and Plan: Sepsis ([place])- (present on admission) - Presented with acute metabolic encephalopathy, lactic acidosis and possible pneumonia. -Cultures negative so far.  Antibiotic plan as below.  Pneumonia involving right lung- (present on admission) - CT of the abdomen and pelvis showed possible right multilobar pneumonia -We will switch antibiotics to Rocephin and Zithromax.  Follow cultures.  SLP eval. -COVID-19/influenza testing negative on presentation.  Acute metabolic encephalopathy - Possibly from above.  Mental status improving.  Monitor mental status.  Fall precautions. -PT eval.  Prostate cancer ([place]) - Follows with Dr. Alen Blew.  Currently on chemotherapy.  Outpatient follow-up with oncology. -Currently on stress dose steroids as well.  We will switch to oral home dose steroid once ready for discharge in the next 1 to 2 days.  Essential hypertension- (present on admission) - Blood pressure currently stable.  Antihypertensives on hold.  Possibly resume on discharge.  SIADH (syndrome of inappropriate ADH production) ([place]) - Outpatient follow-up.  Asthma, mild intermittent- (present on admission) - Currently stable.  Continue home regimen.  Hyponatremia - Mild.  Monitor  Leukocytosis - Improving.   Monitor  Anemia of chronic disease - Possibly from cancer.  Hemoglobin stable.  Monitor.        Subjective:  Patient seen and examined at bedside.  He feels better.  Still feels weak.  No overnight fever, nausea or vomiting.  Physical Exam: Vitals:   09/17/21 0446 09/17/21 0822 09/17/21 0825 09/17/21 0921  BP: 136/74   139/77  Pulse: 83   85  Resp: 20   18  Temp: 98.7 F (37.1 C)   98 F (36.7 C)  TempSrc: Oral   Oral  SpO2: 97% 97% 97% 97%  Weight:      Height:       General: No acute distress, currently on room air ENT/neck: No elevated JVD.  No obvious masses  respiratory: Bilateral decreased breath sounds at bases with some scattered crackles CVS: S1-S2 heard, rate controlled Abdominal: Soft, nontender, nondistended, no organomegaly, bowel sounds heard Extremities: No cyanosis, clubbing, edema CNS: Alert, awake and oriented.  Slightly slow to respond.  No focal neurologic deficit.  Moving extremities. Lymph: No cervical lymphadenopathy Skin: No rashes, lesions, ulcers Psych: Normal mood, affect and judgment Musculoskeletal: No obvious joint deformity/tenderness/swelling   Data Reviewed: I have reviewed patient's investigations during this hospitalization myself.  Today sodium is 133, potassium of 3.5, creatinine of 0.7, normal LFTs, procalcitonin of 1.25, white blood cell count of 15.2, hemoglobin of 10.8.  Blood cultures and urine cultures are negative so far.  Family Communication: None at bedside  Disposition: Status is: Inpatient Remains inpatient appropriate because: Of need for IV antibiotics.     Planned Discharge Destination: Home     Time spent: 50 minutes  Author: Aline August, MD 09/17/2021 10:05 AM  For on call review [email or web address].

## 2021-09-17 NOTE — Plan of Care (Signed)
°  Problem: Respiratory: Goal: Ability to maintain adequate ventilation will improve Outcome: Progressing   Problem: Education: Goal: Knowledge of General Education information will improve Description: Including pain rating scale, medication(s)/side effects and non-pharmacologic comfort measures Outcome: Progressing   Problem: Activity: Goal: Risk for activity intolerance will decrease Outcome: Progressing   Problem: Coping: Goal: Level of anxiety will decrease Outcome: Progressing   Problem: Elimination: Goal: Will not experience complications related to urinary retention Outcome: Progressing   Problem: Pain Managment: Goal: General experience of comfort will improve Outcome: Progressing   Problem: Safety: Goal: Ability to remain free from injury will improve Outcome: Progressing   Problem: Skin Integrity: Goal: Risk for impaired skin integrity will decrease Outcome: Progressing   

## 2021-09-17 NOTE — Assessment & Plan Note (Signed)
-   Possibly from cancer.  Hemoglobin stable.  Monitor.

## 2021-09-18 ENCOUNTER — Inpatient Hospital Stay (HOSPITAL_COMMUNITY): Payer: Medicare Other

## 2021-09-18 DIAGNOSIS — E222 Syndrome of inappropriate secretion of antidiuretic hormone: Secondary | ICD-10-CM

## 2021-09-18 DIAGNOSIS — E876 Hypokalemia: Secondary | ICD-10-CM

## 2021-09-18 LAB — BASIC METABOLIC PANEL
Anion gap: 7 (ref 5–15)
BUN: 11 mg/dL (ref 8–23)
CO2: 20 mmol/L — ABNORMAL LOW (ref 22–32)
Calcium: 8 mg/dL — ABNORMAL LOW (ref 8.9–10.3)
Chloride: 108 mmol/L (ref 98–111)
Creatinine, Ser: 0.69 mg/dL (ref 0.61–1.24)
GFR, Estimated: >60 mL/min (ref >60)
Glucose, Bld: 114 mg/dL — ABNORMAL HIGH (ref 70–99)
Potassium: 3.4 mmol/L — ABNORMAL LOW (ref 3.5–5.1)
Sodium: 135 mmol/L (ref 135–145)

## 2021-09-18 LAB — MAGNESIUM: Magnesium: 1.9 mg/dL (ref 1.7–2.4)

## 2021-09-18 LAB — CBC WITH DIFFERENTIAL/PLATELET
Abs Immature Granulocytes: 0.05 10*3/uL (ref 0.00–0.07)
Basophils Absolute: 0 10*3/uL (ref 0.0–0.1)
Basophils Relative: 0 %
Eosinophils Absolute: 0 10*3/uL (ref 0.0–0.5)
Eosinophils Relative: 0 %
HCT: 30.4 % — ABNORMAL LOW (ref 39.0–52.0)
Hemoglobin: 10.4 g/dL — ABNORMAL LOW (ref 13.0–17.0)
Immature Granulocytes: 1 %
Lymphocytes Relative: 8 %
Lymphs Abs: 0.9 10*3/uL (ref 0.7–4.0)
MCH: 29.5 pg (ref 26.0–34.0)
MCHC: 34.2 g/dL (ref 30.0–36.0)
MCV: 86.4 fL (ref 80.0–100.0)
Monocytes Absolute: 0.5 10*3/uL (ref 0.1–1.0)
Monocytes Relative: 4 %
Neutro Abs: 9.6 10*3/uL — ABNORMAL HIGH (ref 1.7–7.7)
Neutrophils Relative %: 87 %
Platelets: 197 10*3/uL (ref 150–400)
RBC: 3.52 MIL/uL — ABNORMAL LOW (ref 4.22–5.81)
RDW: 14 % (ref 11.5–15.5)
WBC: 11.1 10*3/uL — ABNORMAL HIGH (ref 4.0–10.5)
nRBC: 0 % (ref 0.0–0.2)

## 2021-09-18 LAB — URINE CULTURE: Culture: NO GROWTH

## 2021-09-18 MED ORDER — POTASSIUM CHLORIDE CRYS ER 20 MEQ PO TBCR
40.0000 meq | EXTENDED_RELEASE_TABLET | Freq: Once | ORAL | Status: AC
  Administered 2021-09-18: 40 meq via ORAL
  Filled 2021-09-18: qty 2

## 2021-09-18 NOTE — Progress Notes (Addendum)
Progress Note   Patient: [name] Holder CVE:[id number] DOB: Oct 26, 1955 DOA: 09/16/2021     2 DOS: the patient was seen and examined on 09/18/2021   Brief hospital course: 66 y.o. male with medical history significant of bladder cancer/prostate cancer, chronic pain, HTN, COPD presented with weakness and confusion.  On presentation, lactic acid was above 2 with white count of 23.9.  CT of the head was negative for acute intracranial abnormality.  Chest x-ray showed similar appearance of bilateral pulmonary opacities, favoring scarring; no new lobar consolidation.  He was admitted for sepsis of unknown source with acute metabolic encephalopathy and started on antibiotics.  Subsequently, CT of the abdomen showed possible right multilobar pneumonia.  Antibiotics were switched to Rocephin and Zithromax.  SLP was consulted.  Assessment and Plan: Sepsis ([place])- (present on admission) - Presented with acute metabolic encephalopathy, lactic acidosis and possible pneumonia. -Cultures negative so far.  Antibiotic plan as below.  Decrease IV fluids to 50 cc an hour   Pneumonia involving right lung- (present on admission) - CT of the abdomen and pelvis showed possible right multilobar pneumonia -Continue Rocephin and Zithromax.  Cultures negative so far.  SLP eval. diet as per SLP recommendations. -COVID-19/influenza testing negative on presentation.  Acute metabolic encephalopathy - Possibly from above.  Mental status has much improved.  Monitor mental status. -PT recommends no PT follow-up.  Prostate cancer [facility]) - Follows with Dr. Alen Blew.  Currently on chemotherapy.  Outpatient follow-up with oncology. -Currently on stress dose steroids as well.  We will switch to oral home dose steroid once ready for discharge in the next 1 to 2 days.  Essential hypertension- (present on admission) - Blood pressure currently stable.  Antihypertensives on hold.  Possibly resume on discharge.  SIADH (syndrome of  inappropriate ADH production) ([place]) - Outpatient follow-up.  Asthma, mild intermittent- (present on admission) - Currently stable.  Continue home regimen.  Hyponatremia - Improved  Leukocytosis - Improving.  Monitor  Anemia of chronic disease - Possibly from cancer.  Hemoglobin stable.  Monitor.  Hypokalemia - Replace.  Repeat a.m. labs.        Subjective:  Patient seen and examined at bedside.  Feels little better.  No diarrhea since admission.  Breathing is improving but still short of breath with exertion.  No overnight fever or vomiting reported.  Physical Exam: Vitals:   09/18/21 0412 09/18/21 0552 09/18/21 0828 09/18/21 0830  BP:  (!) 166/99    Pulse:  86    Resp:  18    Temp:  98.2 F (36.8 C)    TempSrc:  Oral    SpO2:  96% 93% 93%  Weight: 102.3 kg     Height:       General: On room air currently.  No distress.   ENT/neck: No palpable thyromegaly.  JVD is not elevated. respiratory: Decreased breath sounds at bases bilaterally with some crackles  CVS: Currently rate controlled; S1-S2 heard Abdominal: Soft, nontender, distended slightly; no organomegaly, normal bowel sounds heard Extremities: Mild lower extremity edema present; no clubbing CNS: Awake and alert.  No focal neurologic deficit.  Moves extremities. Lymph: No palpable lymphadenopathy Skin: No obvious ecchymosis/lesions Psych: Affect, judgment and mood are normal Musculoskeletal: No obvious joint swelling/deformity   Data Reviewed: I have reviewed patient's investigations during this hospitalization myself.  Today's sodium 100, potassium 3.4, creatinine of 0.69, WBCs of 11.1, hemoglobin of 10.4.  Cultures have been negative so far.    Family Communication: None at bedside;  called wife on phone few times but calls didn't go through  Disposition: Status is: Inpatient Remains inpatient appropriate because: Of need for IV antibiotics.    Planned Discharge Destination: Home.  Possibly in  the next 1 to 2 days if improved clinically     Time spent: 50 minutes  Author: Aline August, MD 09/18/2021 9:42 AM  For on call review [email or web address].

## 2021-09-18 NOTE — Progress Notes (Signed)
Modified Barium Swallow Progress Note  Patient Details  Name: [name] Holder MRN: [id number] Date of Birth: April 19, 1956  Today's Date: 09/18/2021  Modified Barium Swallow completed.  Full report located under Chart Review in the Imaging Section.  Brief recommendations include the following:  Clinical Impression  Fortunately normal oropharyngeal swallow ability present  No aspiration, penetration nor retention observed. Pt tested with thin, nectar, pudding, cracker and tablet.  Upon esophageal sweep, pt appears with an episode of retrograde propulsion of liquids distally without sensation (after several swallows of liquids). Barium tablet given with thin appeared to stall briefly distally but cleared with further liquids.  Pt is s/p endoscopy recently with no narrowing visualized.    Pt has diagnoisis of GERD and is on a PPI - his clinical symptoms of waking with coughing, and MBS findings are consistent with his known reflux.  Recommended he follow esophageal/reflux precautions and avoid eating late *as he reports frequently occurs.  Continue to advise frequent rest breaks if dyspneic due to risk of asynchrony of swallow/respirations.    SLP advised pt using monitor live to suspicions, normal oropharyngeal swallow but clearly stated SLP can not diagnosis any esophageal issue and MBS is an oropharyngeal evaluation.    Defer to pt, family, MD if esophagram is desired. SLP will follow up once time to review testing and inform to precautions with pt and his wife and then will sign off.  Thanks.   Swallow Evaluation Recommendations   Recommended Consults: Other (Comment)   SLP Diet Recommendations: Regular solids;Thin liquid   Liquid Administration via: Cup   Medication Administration: Whole meds with liquid       Compensations: Minimize environmental distractions;Slow rate;Small sips/bites (small frequent meals)   Postural Changes: Remain semi-upright after after feeds/meals  (Comment);Seated upright at 90 degrees   Oral Care Recommendations: Oral care BID      Kathleen Lime, MS [facility] SLP Acute Rehab Services Office [phone] Pager [phone]   Macario Golds 09/18/2021,9:42 AM

## 2021-09-18 NOTE — Assessment & Plan Note (Signed)
-   Replace.  Repeat a.m. labs.

## 2021-09-18 NOTE — Progress Notes (Signed)
Speech Language Pathology Treatment: Dysphagia  Patient Details Name: [name] Holder MRN: [id number] DOB: April 05, 1956 Today's Date: 09/18/2021 Time: 1779-3903 SLP Time Calculation (min) (ACUTE ONLY): 25 min  Assessment / Plan / Recommendation Clinical Impression  Skilled SlP follow up for skilled SLP including educating wife to findings of MBS via Facetime.  Observed pt with appearance of mild retrograde propulsion of barium on MBS - - without sensation.  While reviewing MBS with pt and wife, pt was observed to drink water completely laying flat.  No s/s of aspiration but advised he cease consuming ANY po unless sitting upright, especially due to potential esophageal dysmotilty related to reflux.   Using teach back, pt and wife advised to precautions.  Recommend pt follow up with his GI MD as OP regarding ongoing concerns for reflux even with current medication regimen.  Per wife, pt's endscopy showed esophageal changes consistent with reflux - SLP could not access report in the system.   Advised pt consume several small meals daily, cease eating 3 hours before bed time and consider trying to sleep on his left side given he does not stay with HOB elevated.  Hospital bed at home adjusts,  but per wife he is usually flat by the morning.  Left pt with reflux precaution hand out.  No SLP follow up indicated.   Thanks for this consult.    HPI HPI: per MD notes " 66 y.o. male with medical history significant of bladder cancer/prostate cancer, chronic pain, HTN, COPD presented with weakness and confusion.  On presentation, lactic acid was above 2 with white count of 23.9.  CT of the head was negative for acute intracranial abnormality.  Chest x-ray showed similar appearance of bilateral pulmonary opacities, favoring scarring; no new lobar consolidation.  He was admitted for sepsis of unknown source with acute metabolic encephalopathy and started on antibiotics."  Sawllow eval ordered. Pt see in 2021 for eval  due to his report of gagging with po.  Pt also has compression fx hx = new 11/2020.  Per review of chart, pt possibly aspirated overnight prior to admit.  Brain imaging negative for mets.      SLP Plan  All goals met      Recommendations for follow up therapy are one component of a multi-disciplinary discharge planning process, led by the attending physician.  Recommendations may be updated based on patient status, additional functional criteria and insurance authorization.    Recommendations  Diet recommendations: Regular;Thin liquid Liquids provided via: Straw;Cup Medication Administration: Whole meds with liquid Supervision: Patient able to self feed Compensations: Slow rate;Small sips/bites (small frequent meals) Postural Changes and/or Swallow Maneuvers: Seated upright 90 degrees;Upright 30-60 min after meal                Oral Care Recommendations: Oral care BID Follow Up Recommendations: No SLP follow up Assistance recommended at discharge: Frequent or constant Supervision/Assistance SLP Visit Diagnosis: Dysphagia, unspecified (R13.10) Plan: All goals met         Kathleen Lime, MS [facility] SLP Acute Rehab Services Office [phone] Pager [phone]   Macario Golds  09/18/2021, 5:21 PM

## 2021-09-19 LAB — GASTROINTESTINAL PANEL BY PCR, STOOL (REPLACES STOOL CULTURE)

## 2021-09-19 LAB — CBC WITH DIFFERENTIAL/PLATELET
Abs Immature Granulocytes: 0.07 10*3/uL (ref 0.00–0.07)
Basophils Absolute: 0 10*3/uL (ref 0.0–0.1)
Basophils Relative: 0 %
Eosinophils Absolute: 0 10*3/uL (ref 0.0–0.5)
Eosinophils Relative: 1 %
HCT: 31.3 % — ABNORMAL LOW (ref 39.0–52.0)
Hemoglobin: 10 g/dL — ABNORMAL LOW (ref 13.0–17.0)
Immature Granulocytes: 1 %
Lymphocytes Relative: 12 %
Lymphs Abs: 1.1 10*3/uL (ref 0.7–4.0)
MCH: 29.2 pg (ref 26.0–34.0)
MCHC: 31.9 g/dL (ref 30.0–36.0)
MCV: 91.3 fL (ref 80.0–100.0)
Monocytes Absolute: 0.4 10*3/uL (ref 0.1–1.0)
Monocytes Relative: 5 %
Neutro Abs: 7.1 10*3/uL (ref 1.7–7.7)
Neutrophils Relative %: 81 %
Platelets: 183 10*3/uL (ref 150–400)
RBC: 3.43 MIL/uL — ABNORMAL LOW (ref 4.22–5.81)
RDW: 14.3 % (ref 11.5–15.5)
WBC: 8.7 10*3/uL (ref 4.0–10.5)
nRBC: 0 % (ref 0.0–0.2)

## 2021-09-19 LAB — BASIC METABOLIC PANEL
Anion gap: 5 (ref 5–15)
BUN: 9 mg/dL (ref 8–23)
CO2: 21 mmol/L — ABNORMAL LOW (ref 22–32)
Calcium: 7.7 mg/dL — ABNORMAL LOW (ref 8.9–10.3)
Chloride: 111 mmol/L (ref 98–111)
Creatinine, Ser: 0.86 mg/dL (ref 0.61–1.24)
GFR, Estimated: >60 mL/min (ref >60)
Glucose, Bld: 113 mg/dL — ABNORMAL HIGH (ref 70–99)
Potassium: 4.2 mmol/L (ref 3.5–5.1)
Sodium: 137 mmol/L (ref 135–145)

## 2021-09-19 LAB — MAGNESIUM: Magnesium: 2.1 mg/dL (ref 1.7–2.4)

## 2021-09-19 NOTE — Progress Notes (Signed)
Progress Note   Patient: [name] Holder TMH:[id number] DOB: 06-Dec-1955 DOA: 09/16/2021     3 DOS: the patient was seen and examined on 09/19/2021   Brief hospital course: 66 y.o. male with medical history significant of bladder cancer/prostate cancer, chronic pain, HTN, COPD presented with weakness and confusion.  On presentation, lactic acid was above 2 with white count of 23.9.  CT of the head was negative for acute intracranial abnormality.  Chest x-ray showed similar appearance of bilateral pulmonary opacities, favoring scarring; no new lobar consolidation.  He was admitted for sepsis of unknown source with acute metabolic encephalopathy and started on antibiotics.  Subsequently, CT of the abdomen showed possible right multilobar pneumonia.  Antibiotics were switched to Rocephin and Zithromax.  SLP was consulted.  Assessment and Plan: Sepsis ([place])- (present on admission) - Presented with acute metabolic encephalopathy, lactic acidosis and possible pneumonia. -Cultures negative so far.  Antibiotic plan as below. -Sepsis has resolved.  DC IV fluids today.  Pneumonia involving right lung- (present on admission) - CT of the abdomen and pelvis showed possible right multilobar pneumonia -Continue Rocephin and Zithromax.  Possibly switch to oral antibiotics tomorrow.  Cultures negative so far.  SLP eval. diet as per SLP recommendations. -COVID-19/influenza testing negative on presentation.  Acute metabolic encephalopathy - Possibly from above.  Mental status has much improved.  Monitor mental status. -PT recommends no PT follow-up.  Prostate cancer [facility]) - Follows with Dr. Alen Blew.  Currently on chemotherapy.  Outpatient follow-up with oncology. -Currently on stress dose steroids as well.  We will switch to oral home dose steroid once ready  Essential hypertension- (present on admission) - Blood pressure currently stable.  Antihypertensives on hold.  Possibly resume on discharge.  SIADH  (syndrome of inappropriate ADH production) ([place]) - Outpatient follow-up.  Asthma, mild intermittent- (present on admission) - Currently stable.  Continue home regimen.  Hyponatremia - Improved  Leukocytosis - Resolved  Anemia of chronic disease - Possibly from cancer.  Hemoglobin stable.     Subjective:  Patient seen and examined at bedside.  Still feels very short of breath with minimal exertion.  Does not feel ready to go home today.  No overnight fever, vomiting, chest pain or abdominal pain reported.  Physical Exam: Vitals:   09/18/21 2130 09/19/21 0436 09/19/21 0603 09/19/21 0851  BP: (!) 159/89  (!) 158/93   Pulse: 89  82   Resp: 18  18   Temp: 98.1 F (36.7 C)  97.8 F (36.6 C)   TempSrc: Oral  Oral   SpO2: 98%  100% 95%  Weight:  101.7 kg    Height:       General: Currently on room air.  No acute distress.   ENT/neck: JVD not elevated.  No thyromegaly noted. respiratory: Bilateral decreased breath sounds at bases with some crackles; no wheezing  CVS: S1-S2 heard; rate controlled currently Abdominal: Soft, nontender, mildly distended; no organomegaly, bowel sounds are heard Extremities: Trace lower extremity edema present; no cyanosis   Data Reviewed: I have reviewed patient's investigation during this hospitalization myself.  Today sodium is 137, potassium of 4.2, bicarb of 21, creatinine 0.86, WBC 8.7.  Cultures have remained negative so far  Family Communication: None at bedside  Disposition: Status is: Inpatient Remains inpatient appropriate because: For need for IV antibiotics.  Possible discharge tomorrow if remains stable.          Planned Discharge Destination: Home     Time spent: 35 minutes  Author:  Aline August, MD 09/19/2021 9:19 AM  For on call review [email or web address].

## 2021-09-19 NOTE — Plan of Care (Signed)
°  Problem: Respiratory: Goal: Ability to maintain adequate ventilation will improve Outcome: Progressing   Problem: Education: Goal: Knowledge of General Education information will improve Description: Including pain rating scale, medication(s)/side effects and non-pharmacologic comfort measures Outcome: Progressing   Problem: Activity: Goal: Risk for activity intolerance will decrease Outcome: Progressing   Problem: Coping: Goal: Level of anxiety will decrease Outcome: Progressing   Problem: Elimination: Goal: Will not experience complications related to urinary retention Outcome: Progressing   Problem: Pain Managment: Goal: General experience of comfort will improve Outcome: Progressing   Problem: Safety: Goal: Ability to remain free from injury will improve Outcome: Progressing   Problem: Skin Integrity: Goal: Risk for impaired skin integrity will decrease Outcome: Progressing   

## 2021-09-20 DIAGNOSIS — C61 Malignant neoplasm of prostate: Secondary | ICD-10-CM

## 2021-09-20 MED ORDER — CARVEDILOL 3.125 MG PO TABS
3.1250 mg | ORAL_TABLET | Freq: Two times a day (BID) | ORAL | Status: DC
  Administered 2021-09-20: 3.125 mg via ORAL
  Filled 2021-09-20: qty 1

## 2021-09-20 MED ORDER — IRBESARTAN 300 MG PO TABS
300.0000 mg | ORAL_TABLET | Freq: Every day | ORAL | Status: DC
  Administered 2021-09-20: 300 mg via ORAL
  Filled 2021-09-20: qty 1

## 2021-09-20 MED ORDER — CEFUROXIME AXETIL 500 MG PO TABS: 500.0000 mg | ORAL_TABLET | Freq: Two times a day (BID) | ORAL | 0 refills | Status: AC

## 2021-09-20 NOTE — Discharge Summary (Signed)
Physician Discharge Summary   Patient: [name] Holder MRN: [id number] DOB: [birth year]  Admit date:     09/16/2021  Discharge date: 09/20/21  Discharge Physician: Aline August   PCP: [name], MD   Recommendations at discharge:   Follow-up with PCP as an outpatient Follow-up in the ED if symptoms worsen or new.  Hospital Course: 66 y.o. male with medical history significant of bladder cancer/prostate cancer, chronic pain, HTN, COPD presented with weakness and confusion.  On presentation, lactic acid was above 2 with white count of 23.9.  CT of the head was negative for acute intracranial abnormality.  Chest x-ray showed similar appearance of bilateral pulmonary opacities, favoring scarring; no new lobar consolidation.  He was admitted for sepsis of unknown source with acute metabolic encephalopathy and started on antibiotics.  Subsequently, CT of the abdomen showed possible right multilobar pneumonia.  Antibiotics were switched to Rocephin and Zithromax.  During the hospitalization, his respiratory status has improved.  He is currently on room air and feels okay to go home today.  He will be discharged home on oral Ceftin.  Assessment and Plan: Sepsis ([place])- (present on admission) - Presented with acute metabolic encephalopathy, lactic acidosis and possible pneumonia. -Cultures negative so far.  Antibiotic plan as below. -Sepsis has resolved. Off IV fluids today.  Pneumonia involving right lung- (present on admission) - CT of the abdomen and pelvis showed possible right multilobar pneumonia -Currently on Rocephin and Zithromax.   -Diet as per SLP recommendations.  Patient might need outpatient GI evaluation to rule out any form of esophageal dysmotility. -COVID-19/influenza testing negative on presentation. -Respiratory status stable.  Cultures negative so far.  Discharge home today on oral Ceftin.  Outpatient follow-up with PCP.  Acute metabolic encephalopathy - Possibly from  above.  Mental status has much improved.  Monitor mental status. -PT recommends no PT follow-up.  Prostate cancer [facility]) - Follows with Dr. Alen Blew.  Currently on chemotherapy.  Outpatient follow-up with oncology. -Currently on stress dose steroids as well.  Resume home dose of steroid on discharge.  Essential hypertension- (present on admission) - Blood pressure intermittently on the higher side.  Resume home regimen.  Outpatient follow-up.  SIADH (syndrome of inappropriate ADH production) ([place]) - Outpatient follow-up.  Asthma, mild intermittent- (present on admission) - Currently stable.  Continue home regimen.  Hyponatremia - Improved  Leukocytosis - Resolved  Anemia of chronic disease - Possibly from cancer.  Hemoglobin stable.  Monitor.       Consultants: None Procedures performed: None Disposition: Home Diet recommendation:  Discharge Diet Orders (From admission, onward)     Start     Ordered   09/20/21 0000  Diet - low sodium heart healthy        09/20/21 0924           Cardiac diet  DISCHARGE MEDICATION: Allergies as of 09/20/2021       Reactions   Amlodipine Swelling   edema   Tetracycline Rash        Medication List     TAKE these medications    abiraterone acetate 250 MG tablet Commonly known as: ZYTIGA Take 4 tablets (1,000 mg total) by mouth daily.   acetaminophen 325 MG tablet Commonly known as: TYLENOL Take 325 mg by mouth daily as needed for fever.   albuterol 108 (90 Base) MCG/ACT inhaler Commonly known as: VENTOLIN HFA Inhale 2 puffs into the lungs every 6 (six) hours as needed for wheezing. 2 PUFFS EVERY 6  HOURS AS NEEDED FOR WHEEZING   albuterol (2.5 MG/3ML) 0.083% nebulizer solution Commonly known as: PROVENTIL NEBULIZE 1 VIAL EVERY 6-8 HOURS AS NEEDED FOR WHEEZING OR SHORTNESS OF BREATH   azelastine 0.1 % nasal spray Commonly known as: ASTELIN Place 2 puffs into the nose in the morning and at bedtime.    CALCIUM-VITAMIN D PO Take 1 tablet by mouth 2 (two) times daily.   carvedilol 3.125 MG tablet Commonly known as: COREG TAKE (1) TABLET TWICE A DAY WITH MEALS (BREAKFAST AND SUPPER)   cefUROXime 500 MG tablet Commonly known as: CEFTIN Take 1 tablet (500 mg total) by mouth 2 (two) times daily for 5 days.   CENTRUM SILVER 50+MEN PO Take 1 tablet by mouth daily.   denosumab 60 MG/ML Sosy injection Commonly known as: PROLIA Inject 60 mg into the skin every 6 (six) months.   esomeprazole 20 MG capsule Commonly known as: NEXIUM Take 20 mg by mouth daily at 12 noon.   fluticasone 50 MCG/ACT nasal spray Commonly known as: FLONASE Place 2 sprays into both nostrils daily.   furosemide 20 MG tablet Commonly known as: LASIX Take 1 tablet (20 mg total) by mouth daily.   LUPRON IJ Inject 1 Dose as directed every 4 (four) months. This is administered by [facility]   Narcan 4 MG/0.1ML Liqd nasal spray kit Generic drug: naloxone Place 0.4 mg into the nose once as needed (overdose).   olmesartan 40 MG tablet Commonly known as: BENICAR Take 1 tablet (40 mg total) by mouth daily.   oxyCODONE 5 MG immediate release tablet Commonly known as: Oxy IR/ROXICODONE TAKE ONE TABLET EVERY 4 HOURS AS NEEDED What changed:  how much to take how to take this when to take this additional instructions   polyethylene glycol powder 17 GM/SCOOP powder Commonly known as: GLYCOLAX/MIRALAX Take 17 g (1 capful) by mouth daily as directed. What changed: Another medication with the same name was removed. Continue taking this medication, and follow the directions you see here.   predniSONE 5 MG tablet Commonly known as: DELTASONE TAKE 1 TABLET 2 TIMES A DAY   sodium chloride 1 g tablet Take 1 g by mouth 2 (two) times daily with a meal.   solifenacin 5 MG tablet Commonly known as: VESICARE Take 5 mg by mouth daily.   Trelegy Ellipta 100-62.5-25 MCG/ACT Aepb Generic drug:  Fluticasone-Umeclidin-Vilant INHALE 1 PUFF INTO LUNGS DAILY   Vista Advanced Carotenoid Caps Take 1 capsule by mouth every evening.   vitamin C 500 MG tablet Commonly known as: ASCORBIC ACID Take 500 mg by mouth every evening.   vitamin E 180 MG (400 UNITS) capsule Generic drug: vitamin E Take 400 Units by mouth every evening.        Follow-up Information     [name], MD. Schedule an appointment as soon as possible for a visit in 1 week(s).   Specialty: Internal Medicine Contact information: [place] [address] [phone]               Subjective: Patient seen and examined at bedside.  Feels much better and wants to go home today.  Complains of some slight headache.  No fever, vomiting, chest pain reported.  Still mildly short of breath with exertion.  Discharge Exam: Filed Weights   09/18/21 0412 09/19/21 0436 09/20/21 0159  Weight: 102.3 kg 101.7 kg 102.8 kg   Vitals:   09/20/21 0756 09/20/21 0804  BP:  (!) 177/80  Pulse:  70  Resp:    Temp:    SpO2: 98% 100%    General: No acute distress, currently on room air respiratory: Bilateral decreased breath sounds at bases with some scattered crackles CVS: S1-S2 heard, rate controlled Abdominal: Soft, nontender, nondistended, no organomegaly, bowel sounds heard Extremities: No cyanosis, clubbing; mild lower extremity edema present  Condition at discharge: fair  The results of significant diagnostics from this hospitalization (including imaging, microbiology, ancillary and laboratory) are listed below for reference.   Imaging Studies: CT Head Wo Contrast  Result Date: 09/16/2021 CLINICAL DATA:  Provided history: Brain metastases suspected. Additional history provided by scanning technologist: Patient's spouse reports patient aspirated last night while sleeping, confusion this morning. EXAM: CT HEAD WITHOUT CONTRAST TECHNIQUE: Contiguous axial images were obtained from the base of  the skull through the vertex without intravenous contrast. RADIATION DOSE REDUCTION: This exam was performed according to the departmental dose-optimization program which includes automated exposure control, adjustment of the mA and/or kV according to patient size and/or use of iterative reconstruction technique. COMPARISON:  Head CT 08/15/2019.  Brain MRI 12/27/2018. FINDINGS: Brain: Mild generalized cerebral and cerebellar atrophy. Minimal chronic small vessel ischemic changes within the cerebral white matter, better appreciated on the brain MRI of 12/27/2018. There is no acute intracranial hemorrhage. No demarcated cortical infarct. No extra-axial fluid collection. No evidence of an intracranial mass. No midline shift. Vascular: No hyperdense vessel. Atherosclerotic calcifications. Skull: Normal. Negative for fracture or focal lesion. Sinuses/Orbits: Visualized orbits show no acute finding. No significant paranasal sinus disease at the imaged levels. IMPRESSION: No evidence of acute intracranial abnormality. No appreciable intracranial metastatic disease on this non-contrast head CT. However, please note a brain MRI with contrast would have greater sensitivity. Minimal chronic small vessel ischemic changes within the cerebral white matter. Mild generalized cerebral and cerebellar atrophy. Electronically Signed   By: Kellie Simmering D.O.   On: 09/16/2021 14:41   CT Head W or Wo Contrast  Result Date: 09/16/2021 CLINICAL DATA:  Brain metastasis suspected.  Altered mental status. EXAM: CT HEAD WITHOUT AND WITH CONTRAST TECHNIQUE: Contiguous axial images were obtained from the base of the skull through the vertex without and with intravenous contrast. RADIATION DOSE REDUCTION: This exam was performed according to the departmental dose-optimization program which includes automated exposure control, adjustment of the mA and/or kV according to patient size and/or use of iterative reconstruction technique. CONTRAST:   148m OMNIPAQUE IOHEXOL 300 MG/ML  SOLN COMPARISON:  Unenhanced head CT earlier today approximately 2-1/2 hours ago. FINDINGS: Postcontrast imaging of the brain.  Mild motion degradation. Normal enhancement of the brain.  No evidence of metastatic disease. Normal vascular enhancement. Normal arterial and venous enhancement. Ventricle size normal.  No midline shift. IMPRESSION: Normal enhancement of the brain by CT. Negative for metastatic disease and no acute abnormality. Electronically Signed   By: CFranchot GalloM.D.   On: 09/16/2021 17:03   CT ABDOMEN PELVIS W CONTRAST  Result Date: 09/16/2021 CLINICAL DATA:  Abdominal pain, acutem, nonlocalized. History of prostate cancer. EXAM: CT ABDOMEN AND PELVIS WITH CONTRAST TECHNIQUE: Multidetector CT imaging of the abdomen and pelvis was performed using the standard protocol following bolus administration of intravenous contrast. RADIATION DOSE REDUCTION: This exam was performed according to the departmental dose-optimization program which includes automated exposure control, adjustment of the mA and/or kV according to patient size and/or use of iterative reconstruction technique. CONTRAST:  1045mOMNIPAQUE IOHEXOL 300 MG/ML  SOLN COMPARISON:  12/04/2020 FINDINGS: Lower chest: Patchy parenchymal densities  in the right upper lobe, right middle lobe and right lower lobe. Findings are compatible with pneumonia. No pleural effusions. Bandlike density in the lingula could represent atelectasis. Hepatobiliary: Gallbladder is distended without surrounding inflammatory changes. Normal appearance of the liver without a discrete lesion. No biliary dilatation. Main portal venous system is patent. Pancreas: Unremarkable. No pancreatic ductal dilatation or surrounding inflammatory changes. Spleen: Normal in size without focal abnormality. Adrenals/Urinary Tract: Normal adrenal glands. Artifact from the patient's arms limits evaluation of the right kidney. Again noted are right  renal cortical cysts. Prominent left renal sinus cyst. No suspicious renal lesions and no hydronephrosis. Normal appearance of the urinary bladder. Stomach/Bowel: Cecum is located in the lower anterior abdomen at the level of the umbilicus. No evidence for bowel distension or focal bowel inflammation. Normal appearance of the stomach. Vascular/Lymphatic: Atherosclerotic calcifications in the abdominal aorta without an aneurysm. No lymph node enlargement in the abdomen or pelvis. Reproductive: Stable small prostate. Other: Negative for free fluid. Negative for free air. Question a tiny umbilical hernia containing fat. Musculoskeletal: Again noted are multiple vertebral body compression fractures. There is vertebral body cement at T10, T12, L1 and L2. Chronic vertebral body compression fractures at T8 and T9. IMPRESSION: 1. Right lung pneumonia. Patchy disease in the right upper lobe, right middle lobe and right lower lobe. 2. No acute abnormality in the abdomen or pelvis. 3. Gallbladder distension without inflammatory changes. 4. Bilateral renal cysts. Limited evaluation of the kidneys due to streak artifact. 5. Multiple vertebral body compression fractures. Previous vertebral body augmentation procedures. Electronically Signed   By: Markus Daft M.D.   On: 09/16/2021 17:12   DG Chest Port 1 View  Result Date: 09/16/2021 CLINICAL DATA:  Possible aspiration.  Confusion. EXAM: PORTABLE CHEST 1 VIEW COMPARISON:  08/15/2019 FINDINGS: Numerous leads and wires project over the chest. Midline trachea. Normal heart size for level of inspiration. Mild right hemidiaphragm elevation. Apical lordotic positioning. Similar appearance of relatively linear/wedge-shaped areas of bibasilar and left midlung airspace disease. IMPRESSION: Limited AP portable radiograph with low lung volumes and overlying wires and leads. Similar appearance of bilateral pulmonary opacities, favoring scarring. No new lobar consolidation. Electronically  Signed   By: Abigail Miyamoto M.D.   On: 09/16/2021 11:22   DG Swallowing Func-Speech Pathology  Result Date: 09/18/2021 Table formatting from the original result was not included. Objective Swallowing Evaluation: Type of Study: MBS-Modified Barium Swallow Study  Patient Details Name: DURWOOD DITTUS MRN: [id number] Date of Birth: 01/13/1956 Today's Date: 09/18/2021 Time: SLP Start Time (ACUTE ONLY): 0845 -SLP Stop Time (ACUTE ONLY): 0910 SLP Time Calculation (min) (ACUTE ONLY): 25 min Past Medical History: Past Medical History: Diagnosis Date  Allergic rhinitis   At risk for sleep apnea   STOP-BANG SCORE = 5    (routed to pt's pcp 09-08-2018)  Bladder cancer ([place]) dx'd 2020  Chronic constipation   Chronic low back pain   Chronic pain   DOE (dyspnea on exertion)   History of kidney stones   History of recent pneumonia 08/10/2018  acute bronchopneumonia;  follow-up cxr 08-28-2018 in epic  History of vertebral compression fracture   T10 and T12 s/p kyphoplasty  Hx of colonic polyps   Hyperplasia of prostate with lower urinary tract symptoms (LUTS)   Hypertension   Mild persistent asthma   followed by pcp  Numbness in both legs   secondary to a fall, per pt has had work-up and couldn't find anything  Osteopenia   Prostate cancer  metastatic to pelvis [facility]) urologist-- dr borden/  oncologist -- dr Alen Blew  dx 2014 advanced prostate cancer with pelvid adenopathy,  Stage T2c, Gleason 7;  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  Wears glasses  Past Surgical History: Past Surgical History: Procedure Laterality Date  COLONOSCOPY    IR KYPHO EA ADDL LEVEL THORACIC OR LUMBAR  08/17/2018  IR KYPHO EA ADDL LEVEL THORACIC OR LUMBAR  08/26/2018  IR KYPHO LUMBAR INC FX REDUCE BONE BX UNI/BIL CANNULATION INC/IMAGING  08/26/2018  IR KYPHO THORACIC WITH BONE BIOPSY  08/17/2018  IR RADIOLOGIST EVAL & MGMT  08/11/2018  IR RADIOLOGIST EVAL & MGMT  08/24/2018  LYMPHADENECTOMY Left 01/05/2013  Procedure: ROBOTIC LYMPHADENECTOMY;  Surgeon: Dutch Gray, MD;   Location: [facility];  Service: Urology;  Laterality: Left;  PROSTATE BIOPSY    11/12 positive biopsies  ROTATOR CUFF REPAIR Left 2012  TRANSURETHRAL RESECTION OF BLADDER TUMOR N/A 09/15/2018  Procedure: TRANSURETHRAL RESECTION OF BLADDER TUMOR (TURBT)WITH CYSTOSCOPY/ POST OPERATIVE INSTILLATION OF GEMCITABINE;  Surgeon: Raynelle Bring, MD;  Location: [facility];  Service: Urology;  Laterality: N/A;  GENERAL ANESTHESIA WITH PARALYSIS  UMBILICAL HERNIA REPAIR  09-02-2001   dr t. price _0  HPI: per MD notes " 66 y.o. male with medical history significant of bladder cancer/prostate cancer, chronic pain, HTN, COPD presented with weakness and confusion.  On presentation, lactic acid was above 2 with white count of 23.9.  CT of the head was negative for acute intracranial abnormality.  Chest x-ray showed similar appearance of bilateral pulmonary opacities, favoring scarring; no new lobar consolidation.  He was admitted for sepsis of unknown source with acute metabolic encephalopathy and started on antibiotics."  Sawllow eval ordered. Pt see in 2021 for eval due to his report of gagging with po.  Pt also has compression fx hx = new 11/2020.  Per review of chart, pt possibly aspirated overnight prior to admit.  Brain imaging negative for mets.  Subjective: pt awake in chair  Recommendations for follow up therapy are one component of a multi-disciplinary discharge planning process, led by the attending physician.  Recommendations may be updated based on patient status, additional functional criteria and insurance authorization. Assessment / Plan / Recommendation Clinical Impressions 09/18/2021 Clinical Impression Fortunately normal oropharyngeal swallow ability present  No aspiration, penetration nor retention observed. Pt tested with thin, nectar, pudding, cracker and tablet.  Upon esophageal sweep, pt appears with an episode of retrograde propulsion of liquids distally without sensation (after several swallows of liquids). Barium  tablet given with thin appeared to stall briefly distally but cleared with further liquids.  Pt is s/p endoscopy recently with no narrowing visualized.  Pt has diagnoisis of GERD and is on a PPI - his clinical symptoms of waking with coughing, and MBS findings are consistent with his known reflux.  Recommended he follow esophageal/reflux precautions and avoid eating late *as he reports frequently occurs.  Continue to advise frequent rest breaks if dyspneic due to risk of asynchrony of swallow/respirations.  SLP advised pt using monitor live to suspicions, normal oropharyngeal swallow but clearly stated SLP can not diagnosis any esophageal issue and MBS is an oropharyngeal evaluation.  Defer to pt, family, MD if esophagram is desired. SLP will follow up once time to review testing and inform to precautions with pt and his wife and then will sign off.  Thanks.  Swallow Evaluation Recommendations  Recommended Consults: Other (Comment)  SLP Diet Recommendations: Regular solids;Thin liquid  Liquid Administration via: Cup  Medication Administration: Whole meds with  liquid SLP Visit Diagnosis Dysphagia, unspecified (R13.10) Attention and concentration deficit following -- Frontal lobe and executive function deficit following -- Impact on safety and function Mild aspiration risk   Treatment Recommendations 09/17/2021 Treatment Recommendations F/U MBS in --- days (Comment)   Prognosis 09/18/2021 Prognosis for Safe Diet Advancement Good Barriers to Reach Goals -- Barriers/Prognosis Comment -- Diet Recommendations 09/18/2021 SLP Diet Recommendations Regular solids;Thin liquid Liquid Administration via Cup Medication Administration Whole meds with liquid Compensations Minimize environmental distractions;Slow rate;Small sips/bites Postural Changes Remain semi-upright after after feeds/meals (Comment);Seated upright at 90 degrees   Other Recommendations 09/18/2021 Recommended Consults Other (Comment) Oral Care Recommendations Oral  care BID Other Recommendations -- Follow Up Recommendations No SLP follow up Assistance recommended at discharge Frequent or constant Supervision/Assistance Functional Status Assessment -- Frequency and Duration  09/17/2021 Speech Therapy Frequency (ACUTE ONLY) min 1 x/week Treatment Duration 1 week   Oral Phase 09/18/2021 Oral Phase WFL Oral - Pudding Teaspoon -- Oral - Pudding Cup -- Oral - Honey Teaspoon -- Oral - Honey Cup -- Oral - Nectar Teaspoon -- Oral - Nectar Cup -- Oral - Nectar Straw -- Oral - Thin Teaspoon -- Oral - Thin Cup -- Oral - Thin Straw -- Oral - Puree -- Oral - Mech Soft -- Oral - Regular -- Oral - Multi-Consistency -- Oral - Pill -- Oral Phase - Comment --  Pharyngeal Phase 09/18/2021 Pharyngeal Phase WFL Pharyngeal- Pudding Teaspoon -- Pharyngeal -- Pharyngeal- Pudding Cup -- Pharyngeal -- Pharyngeal- Honey Teaspoon -- Pharyngeal -- Pharyngeal- Honey Cup -- Pharyngeal -- Pharyngeal- Nectar Teaspoon -- Pharyngeal -- Pharyngeal- Nectar Cup -- Pharyngeal -- Pharyngeal- Nectar Straw -- Pharyngeal -- Pharyngeal- Thin Teaspoon -- Pharyngeal -- Pharyngeal- Thin Cup -- Pharyngeal -- Pharyngeal- Thin Straw -- Pharyngeal -- Pharyngeal- Puree -- Pharyngeal -- Pharyngeal- Mechanical Soft -- Pharyngeal -- Pharyngeal- Regular -- Pharyngeal -- Pharyngeal- Multi-consistency -- Pharyngeal -- Pharyngeal- Pill -- Pharyngeal -- Pharyngeal Comment --  Cervical Esophageal Phase  09/18/2021 Cervical Esophageal Phase WFL Pudding Teaspoon -- Pudding Cup -- Honey Teaspoon -- Honey Cup -- Nectar Teaspoon -- Nectar Cup -- Nectar Straw -- Thin Teaspoon -- Thin Cup -- Thin Straw -- Puree -- Mechanical Soft -- Regular -- Multi-consistency -- Pill -- Cervical Esophageal Comment Upon esophageal sweep, pt appears with retrograde propulsion of liquids distally without sensation. Barium tablet given with thin appeared to stall briefly distally but cleared with further liquids.  Pt is s/p endoscopy recently with no narrowing  visualized. Macario Golds 09/18/2021, 9:43 AM    Kathleen Lime, MS [facility] SLP Acute Rehab Services Office [phone] Pager [phone]                   Microbiology: Results for orders placed or performed during the hospital encounter of 09/16/21  Resp Panel by RT-PCR (Flu A&B, Covid) Nasopharyngeal Swab     Status: None   Collection Time: 09/16/21 10:56 AM   Specimen: Nasopharyngeal Swab; Nasopharyngeal(NP) swabs in vial transport medium  Result Value Ref Range Status   SARS Coronavirus 2 by RT PCR NEGATIVE NEGATIVE Final    Comment: (NOTE) SARS-CoV-2 target nucleic acids are NOT DETECTED.  The SARS-CoV-2 RNA is generally detectable in upper respiratory specimens during the acute phase of infection. The lowest concentration of SARS-CoV-2 viral copies this assay can detect is 138 copies/mL. A negative result does not preclude SARS-Cov-2 infection and should not be used as the sole basis for treatment or other patient management decisions. A negative result may occur  with  improper specimen collection/handling, submission of specimen other than nasopharyngeal swab, presence of viral mutation(s) within the areas targeted by this assay, and inadequate number of viral copies(<138 copies/mL). A negative result must be combined with clinical observations, patient history, and epidemiological information. The expected result is Negative.  Fact Sheet for Patients:  EntrepreneurPulse.com.au  Fact Sheet for Healthcare Providers:  IncredibleEmployment.be  This test is no t yet approved or cleared by the [place] FDA and  has been authorized for detection and/or diagnosis of SARS-CoV-2 by FDA under an Emergency Use Authorization (EUA). This EUA will remain  in effect (meaning this test can be used) for the duration of the COVID-19 declaration under Section 564(b)(1) of the Act, 21 U.S.C.section 360bbb-3(b)(1), unless the authorization is terminated  or  revoked sooner.       Influenza A by PCR NEGATIVE NEGATIVE Final   Influenza B by PCR NEGATIVE NEGATIVE Final    Comment: (NOTE) The Xpert Xpress SARS-CoV-2/FLU/RSV plus assay is intended as an aid in the diagnosis of influenza from Nasopharyngeal swab specimens and should not be used as a sole basis for treatment. Nasal washings and aspirates are unacceptable for Xpert Xpress SARS-CoV-2/FLU/RSV testing.  Fact Sheet for Patients: EntrepreneurPulse.com.au  Fact Sheet for Healthcare Providers: IncredibleEmployment.be  This test is not yet approved or cleared by the [place] FDA and has been authorized for detection and/or diagnosis of SARS-CoV-2 by FDA under an Emergency Use Authorization (EUA). This EUA will remain in effect (meaning this test can be used) for the duration of the COVID-19 declaration under Section 564(b)(1) of the Act, 21 U.S.C. section 360bbb-3(b)(1), unless the authorization is terminated or revoked.  Performed at [facility], [place] [address], [place], [place] [address]   Blood culture (routine x 2)     Status: None (Preliminary result)   Collection Time: 09/16/21 11:16 AM   Specimen: BLOOD  Result Value Ref Range Status   Specimen Description   Final    BLOOD LEFT ARM Performed at [facility] [address], [place], [place] [address]    Special Requests   Final    BOTTLES DRAWN AEROBIC AND ANAEROBIC Blood Culture adequate volume Performed at [facility] [address], [place], [place] [address]    Culture   Final    NO GROWTH 4 DAYS Performed at [facility], [place] [address] [place], [place] [address]    Report Status PENDING  Incomplete  Blood culture (routine x 2)     Status: None (Preliminary result)   Collection Time: 09/16/21 11:25 AM   Specimen: BLOOD  Result Value Ref Range Status   Specimen Description   Final    BLOOD RIGHT  ANTECUBITAL Performed at [facility] [address] [place], [place] [address]    Special Requests   Final    BOTTLES DRAWN AEROBIC AND ANAEROBIC Blood Culture results may not be optimal due to an inadequate volume of blood received in culture bottles Performed at [facility] [address] [place], [place] [address]    Culture   Final    NO GROWTH 4 DAYS Performed at [facility], [place] [address], [place], [place] [address]    Report Status PENDING  Incomplete  Urine Culture     Status: None   Collection Time: 09/16/21  2:02 PM   Specimen: In/Out Cath Urine  Result Value Ref Range Status   Specimen Description  Final    IN/OUT CATH URINE Performed at [facility], [place] [address] [place], [place] [address]    Special Requests   Final    NONE Performed at [facility], [place] [address] [place], [place] [address]    Culture   Final    NO GROWTH Performed at [facility], [place] [address] [place], [place] [address]    Report Status 09/18/2021 FINAL  Final  MRSA Next Gen by PCR, Nasal     Status: None   Collection Time: 09/17/21  6:46 AM   Specimen: Nasal Mucosa  Result Value Ref Range Status   MRSA by PCR Next Gen NOT DETECTED NOT DETECTED Final    Comment: (NOTE) The GeneXpert MRSA Assay (FDA approved for NASAL specimens only), is one component of a comprehensive MRSA colonization surveillance program. It is not intended to diagnose MRSA infection nor to guide or monitor treatment for MRSA infections. Test performance is not FDA approved in patients less than 48 years old. Performed at [facility], [place] [address] [place], [place] [address]   Gastrointestinal Panel by PCR , Stool     Status: None   Collection Time: 09/18/21  7:35 AM   Specimen: Stool  Result Value Ref Range Status   Campylobacter species NOT DETECTED NOT DETECTED Final   Plesimonas  shigelloides NOT DETECTED NOT DETECTED Final   Salmonella species NOT DETECTED NOT DETECTED Final   Yersinia enterocolitica NOT DETECTED NOT DETECTED Final   Vibrio species NOT DETECTED NOT DETECTED Final   Vibrio cholerae NOT DETECTED NOT DETECTED Final   Enteroaggregative E coli (EAEC) NOT DETECTED NOT DETECTED Final   Enteropathogenic E coli (EPEC) NOT DETECTED NOT DETECTED Final   Enterotoxigenic E coli (ETEC) NOT DETECTED NOT DETECTED Final   Shiga like toxin producing E coli (STEC) NOT DETECTED NOT DETECTED Final   Shigella/Enteroinvasive E coli (EIEC) NOT DETECTED NOT DETECTED Final   Cryptosporidium NOT DETECTED NOT DETECTED Final   Cyclospora cayetanensis NOT DETECTED NOT DETECTED Final   Entamoeba histolytica NOT DETECTED NOT DETECTED Final   Giardia lamblia NOT DETECTED NOT DETECTED Final   Adenovirus F40/41 NOT DETECTED NOT DETECTED Final   Astrovirus NOT DETECTED NOT DETECTED Final   Norovirus GI/GII NOT DETECTED NOT DETECTED Final   Rotavirus A NOT DETECTED NOT DETECTED Final   Sapovirus (I, II, IV, and V) NOT DETECTED NOT DETECTED Final    Comment: Performed at [facility], [place] [place], [place] [address]    Labs: CBC: Recent Labs  Lab 09/16/21 1247 09/17/21 0404 09/18/21 0403 09/19/21 0351  WBC 23.9* 15.5*   15.2* 11.1* 8.7  NEUTROABS 21.0*  --  9.6* 7.1  HGB 12.0* 10.9*   10.8* 10.4* 10.0*  HCT 36.2* 32.6*   32.6* 30.4* 31.3*  MCV 87.9 87.4   88.1 86.4 91.3  PLT 245 205   195 197 115   Basic Metabolic Panel: Recent Labs  Lab 09/16/21 1116 09/17/21 0404 09/18/21 0403 09/19/21 0351  NA 135 133* 135 137  K 3.5 3.5 3.4* 4.2  CL 102 107 108 111  CO2 23 21* 20* 21*  GLUCOSE 115* 124* 114* 113*  BUN 25* _0 CREATININE 1.17 0.73   0.70 0.69 0.86  CALCIUM 9.2 8.4* 8.0* 7.7*  MG  --   --  1.9 2.1   Liver Function Tests: Recent Labs  Lab 09/16/21 1116 09/17/21 0404  AST 23 15  ALT 21 18  ALKPHOS 47 41  BILITOT 0.5 0.6   PROT 6.5 5.5*  ALBUMIN 3.8 3.1*   CBG: Recent Labs  Lab 09/16/21 1032 09/17/21 2149  GLUCAP 131* 102*    Discharge time spent: greater than 30 minutes.  Signed: Aline August, MD [facility] 09/20/2021

## 2021-09-21 LAB — CULTURE, BLOOD (ROUTINE X 2)
Culture: NO GROWTH
Culture: NO GROWTH
Special Requests: ADEQUATE

## 2021-09-22 ENCOUNTER — Telehealth: Payer: Self-pay

## 2021-09-22 NOTE — Telephone Encounter (Signed)
Pt wife is wondering what to do about Pt BP being elevated. The BP reading this morning was 170/92.  Pt was recently in the hospital Dx Septic and  Pneumonia and they held the BP meds due to it being so low 88/50 reported by wife.

## 2021-09-22 NOTE — Telephone Encounter (Signed)
Pt OV moved to 2/28 @ 2pm per PCP request. Pt is aware.

## 2021-09-22 NOTE — Telephone Encounter (Signed)
Transition Care Management Follow-up Telephone Call Date of discharge and from where:[facility] 09-20-21 Dx: sepsis  How have you been since you were released from the hospital? ok Any questions or concerns? Yes- BP running a little high- 903 systolic   Items Reviewed: Did the pt receive and understand the discharge instructions provided? Yes  Medications obtained and verified? Yes  Other? No  Any new allergies since your discharge? No  Dietary orders reviewed? Yes Do you have support at home? Yes   Home Care and Equipment/Supplies: Were home health services ordered? no If so, what is the name of the agency? na  Has the agency set up a time to come to the patient's home? not applicable Were any new equipment or medical supplies ordered?  No What is the name of the medical supply agency? Na  Were you able to get the supplies/equipment? no Do you have any questions related to the use of the equipment or supplies? No  Functional Questionnaire: (I = Independent and D = Dependent) ADLs: I  Bathing/Dressing- I  Meal Prep- I  Eating- I  Maintaining continence- I  Transferring/Ambulation- I  Managing Meds- I  Follow up appointments reviewed:  PCP Hospital f/u appt confirmed? Yes  Scheduled to see Dr [name] on 09-23-21 @ [facility] f/u appt confirmed? No  . Are transportation arrangements needed? No  If their condition worsens, is the pt aware to call PCP or go to the Emergency Dept.? Yes Was the patient provided with contact information for the PCP's office or ED? Yes Was to pt encouraged to call back with questions or concerns? Yes

## 2021-09-23 ENCOUNTER — Ambulatory Visit (INDEPENDENT_AMBULATORY_CARE_PROVIDER_SITE_OTHER): Payer: Medicare Other | Admitting: Internal Medicine

## 2021-09-23 ENCOUNTER — Ambulatory Visit (INDEPENDENT_AMBULATORY_CARE_PROVIDER_SITE_OTHER): Payer: Medicare Other

## 2021-09-23 ENCOUNTER — Encounter: Payer: Self-pay | Admitting: Internal Medicine

## 2021-09-23 ENCOUNTER — Other Ambulatory Visit: Payer: Self-pay

## 2021-09-23 VITALS — BP 142/86 | HR 74 | Temp 98.6°F | Resp 16 | Ht 68.0 in

## 2021-09-23 DIAGNOSIS — J189 Pneumonia, unspecified organism: Secondary | ICD-10-CM | POA: Diagnosis not present

## 2021-09-23 DIAGNOSIS — I10 Essential (primary) hypertension: Secondary | ICD-10-CM | POA: Diagnosis not present

## 2021-09-23 DIAGNOSIS — I517 Cardiomegaly: Secondary | ICD-10-CM | POA: Diagnosis not present

## 2021-09-23 MED ORDER — FUROSEMIDE 20 MG PO TABS
20.0000 mg | ORAL_TABLET | Freq: Two times a day (BID) | ORAL | 1 refills | Status: DC
Start: 2021-09-23 — End: 2022-03-23

## 2021-09-23 NOTE — Patient Instructions (Signed)

## 2021-09-23 NOTE — Progress Notes (Signed)
Subjective:  Patient ID: [name] Holder, male    DOB: 1955/12/11  Age: 66 y.o. MRN: [id number]  CC: Hypertension  This visit occurred during the SARS-CoV-2 public health emergency.  Safety protocols were in place, including screening questions prior to the visit, additional usage of staff PPE, and extensive cleaning of exam room while observing appropriate contact time as indicated for disinfecting solutions.    HPI [name] Holder presents for f/up - Recent admission for PNA. Cough is better. He is concerned that his BP is intermittently too high.  Admit date:     09/16/2021  Discharge date: 09/20/21  Discharge Physician: Aline August    PCP: [name], MD    Recommendations at discharge:    Follow-up with PCP as an outpatient Follow-up in the ED if symptoms worsen or new.   Hospital Course: 66 y.o. male with medical history significant of bladder cancer/prostate cancer, chronic pain, HTN, COPD presented with weakness and confusion.  On presentation, lactic acid was above 2 with white count of 23.9.  CT of the head was negative for acute intracranial abnormality.  Chest x-ray showed similar appearance of bilateral pulmonary opacities, favoring scarring; no new lobar consolidation.  He was admitted for sepsis of unknown source with acute metabolic encephalopathy and started on antibiotics.  Subsequently, CT of the abdomen showed possible right multilobar pneumonia.  Antibiotics were switched to Rocephin and Zithromax.  During the hospitalization, his respiratory status has improved.  He is currently on room air and feels okay to go home today.  He will be discharged home on oral Ceftin.   Assessment and Plan: Sepsis ([place])- (present on admission) - Presented with acute metabolic encephalopathy, lactic acidosis and possible pneumonia. -Cultures negative so far.  Antibiotic plan as below. -Sepsis has resolved. Off IV fluids today.   Pneumonia involving right lung- (present on  admission) - CT of the abdomen and pelvis showed possible right multilobar pneumonia -Currently on Rocephin and Zithromax.   -Diet as per SLP recommendations.  Patient might need outpatient GI evaluation to rule out any form of esophageal dysmotility. -COVID-19/influenza testing negative on presentation. -Respiratory status stable.  Cultures negative so far.  Discharge home today on oral Ceftin.  Outpatient follow-up with PCP.  Outpatient Medications Prior to Visit  Medication Sig Dispense Refill   abiraterone acetate (ZYTIGA) 250 MG tablet Take 4 tablets (1,000 mg total) by mouth daily. 120 tablet 10   acetaminophen (TYLENOL) 325 MG tablet Take 325 mg by mouth daily as needed for fever.     albuterol (PROVENTIL) (2.5 MG/3ML) 0.083% nebulizer solution NEBULIZE 1 VIAL EVERY 6-8 HOURS AS NEEDED FOR WHEEZING OR SHORTNESS OF BREATH 360 mL 1   albuterol (VENTOLIN HFA) 108 (90 Base) MCG/ACT inhaler Inhale 2 puffs into the lungs every 6 (six) hours as needed for wheezing. 2 PUFFS EVERY 6 HOURS AS NEEDED FOR WHEEZING 8.5 g 5   azelastine (ASTELIN) 0.1 % nasal spray Place 2 puffs into the nose in the morning and at bedtime. 90 mL 1   budesonide-formoterol (SYMBICORT) 160-4.5 MCG/ACT inhaler      CALCIUM-VITAMIN D PO Take 1 tablet by mouth 2 (two) times daily.      carvedilol (COREG) 3.125 MG tablet TAKE (1) TABLET TWICE A DAY WITH MEALS (BREAKFAST AND SUPPER) 180 tablet 1   cefUROXime (CEFTIN) 500 MG tablet Take 1 tablet (500 mg total) by mouth 2 (two) times daily for 5 days. 10 tablet 0   denosumab (PROLIA) 60  MG/ML SOSY injection Inject 60 mg into the skin every 6 (six) months.     esomeprazole (NEXIUM) 20 MG capsule Take 20 mg by mouth daily at 12 noon.     fluticasone (FLONASE) 50 MCG/ACT nasal spray Place 2 sprays into both nostrils daily. 48 g 1   Fluticasone-Umeclidin-Vilant (TRELEGY ELLIPTA) 100-62.5-25 MCG/INH AEPB INHALE 1 PUFF INTO LUNGS DAILY 60 each 11   Leuprolide Acetate (LUPRON IJ)  Inject 1 Dose as directed every 4 (four) months. This is administered by [facility]     Multiple Vitamins-Minerals (CENTRUM SILVER 50+MEN PO) Take 1 tablet by mouth daily.     NARCAN 4 MG/0.1ML LIQD nasal spray kit Place 0.4 mg into the nose once as needed (overdose).      olmesartan (BENICAR) 40 MG tablet Take 1 tablet (40 mg total) by mouth daily. 90 tablet 1   oxyCODONE (OXY IR/ROXICODONE) 5 MG immediate release tablet TAKE ONE TABLET EVERY 4 HOURS AS NEEDED (Patient taking differently: Take 5 mg by mouth every 4 (four) hours.) 180 tablet 0   polyethylene glycol powder (GLYCOLAX/MIRALAX) 17 GM/SCOOP powder Take 17 g (1 capful) by mouth daily as directed. 510 g 6   predniSONE (DELTASONE) 5 MG tablet TAKE 1 TABLET 2 TIMES A DAY 90 tablet 3   sodium chloride 1 g tablet Take 1 g by mouth 2 (two) times daily with a meal.     solifenacin (VESICARE) 5 MG tablet Take 5 mg by mouth daily.     Specialty Vitamins Products (VISTA ADVANCED CAROTENOID) CAPS Take 1 capsule by mouth every evening.     vitamin C (ASCORBIC ACID) 500 MG tablet Take 500 mg by mouth every evening.     vitamin E 180 MG (400 UNITS) capsule Take 400 Units by mouth every evening.     furosemide (LASIX) 20 MG tablet Take 1 tablet (20 mg total) by mouth daily. 90 tablet 1   No facility-administered medications prior to visit.    ROS Review of Systems  Constitutional:  Positive for unexpected weight change. Negative for chills, diaphoresis, fatigue and fever.  HENT: Negative.    Eyes: Negative.   Respiratory:  Positive for cough. Negative for chest tightness, shortness of breath and wheezing.   Cardiovascular:  Negative for chest pain, palpitations and leg swelling.  Gastrointestinal:  Negative for abdominal pain, diarrhea, nausea and vomiting.  Genitourinary: Negative.  Negative for difficulty urinating and dysuria.  Musculoskeletal: Negative.   Skin: Negative.   Neurological:  Negative for dizziness, weakness,  light-headedness and headaches.  Hematological:  Negative for adenopathy. Does not bruise/bleed easily.  Psychiatric/Behavioral: Negative.     Objective:  BP (!) 142/86 (BP Location: Left Arm, Patient Position: Sitting, Cuff Size: Large)    Pulse 74    Temp 98.6 F (37 C) (Oral)    Resp 16    Ht $R'5\' 8"'tH$  (1.727 m)    SpO2 97%    BMI 34.47 kg/m   BP Readings from Last 3 Encounters:  09/23/21 (!) 142/86  09/20/21 (!) 177/80  07/30/21 116/86    Wt Readings from Last 3 Encounters:  09/20/21 226 lb 11.2 oz (102.8 kg)  07/30/21 220 lb 4.8 oz (99.9 kg)  04/29/21 222 lb (100.7 kg)    Physical Exam Vitals reviewed.  HENT:     Nose: Nose normal.     Mouth/Throat:     Mouth: Mucous membranes are moist.  Eyes:     General: No scleral icterus.    Conjunctiva/sclera:  Conjunctivae normal.  Cardiovascular:     Rate and Rhythm: Normal rate and regular rhythm.     Heart sounds: No murmur heard. Pulmonary:     Effort: Pulmonary effort is normal.     Breath sounds: No stridor. No wheezing, rhonchi or rales.  Abdominal:     General: Abdomen is flat.     Palpations: There is no mass.     Tenderness: There is no abdominal tenderness. There is no guarding.     Hernia: No hernia is present.  Musculoskeletal:        General: Normal range of motion.     Cervical back: Neck supple.     Right lower leg: No edema.     Left lower leg: No edema.  Lymphadenopathy:     Cervical: No cervical adenopathy.  Skin:    General: Skin is warm and dry.     Findings: No rash.  Neurological:     General: No focal deficit present.     Mental Status: He is alert.    Lab Results  Component Value Date   WBC 8.7 09/19/2021   HGB 10.0 (L) 09/19/2021   HCT 31.3 (L) 09/19/2021   PLT 183 09/19/2021   GLUCOSE 113 (H) 09/19/2021   CHOL 192 09/30/2020   TRIG 190.0 (H) 09/30/2020   HDL 47.30 09/30/2020   LDLCALC 106 (H) 09/30/2020   ALT 18 09/17/2021   AST 15 09/17/2021   NA 137 09/19/2021   K 4.2 09/19/2021    CL 111 09/19/2021   CREATININE 0.86 09/19/2021   BUN 9 09/19/2021   CO2 21 (L) 09/19/2021   TSH 0.86 09/30/2020   PSA 0.40 03/12/2021   INR 1.1 09/17/2021   HGBA1C 5.2 08/31/2019    CT Head Wo Contrast  Result Date: 09/16/2021 CLINICAL DATA:  Provided history: Brain metastases suspected. Additional history provided by scanning technologist: Patient's spouse reports patient aspirated last night while sleeping, confusion this morning. EXAM: CT HEAD WITHOUT CONTRAST TECHNIQUE: Contiguous axial images were obtained from the base of the skull through the vertex without intravenous contrast. RADIATION DOSE REDUCTION: This exam was performed according to the departmental dose-optimization program which includes automated exposure control, adjustment of the mA and/or kV according to patient size and/or use of iterative reconstruction technique. COMPARISON:  Head CT 08/15/2019.  Brain MRI 12/27/2018. FINDINGS: Brain: Mild generalized cerebral and cerebellar atrophy. Minimal chronic small vessel ischemic changes within the cerebral white matter, better appreciated on the brain MRI of 12/27/2018. There is no acute intracranial hemorrhage. No demarcated cortical infarct. No extra-axial fluid collection. No evidence of an intracranial mass. No midline shift. Vascular: No hyperdense vessel. Atherosclerotic calcifications. Skull: Normal. Negative for fracture or focal lesion. Sinuses/Orbits: Visualized orbits show no acute finding. No significant paranasal sinus disease at the imaged levels. IMPRESSION: No evidence of acute intracranial abnormality. No appreciable intracranial metastatic disease on this non-contrast head CT. However, please note a brain MRI with contrast would have greater sensitivity. Minimal chronic small vessel ischemic changes within the cerebral white matter. Mild generalized cerebral and cerebellar atrophy. Electronically Signed   By: Kellie Simmering D.O.   On: 09/16/2021 14:41   CT Head W or  Wo Contrast  Result Date: 09/16/2021 CLINICAL DATA:  Brain metastasis suspected.  Altered mental status. EXAM: CT HEAD WITHOUT AND WITH CONTRAST TECHNIQUE: Contiguous axial images were obtained from the base of the skull through the vertex without and with intravenous contrast. RADIATION DOSE REDUCTION: This exam was performed according  to the departmental dose-optimization program which includes automated exposure control, adjustment of the mA and/or kV according to patient size and/or use of iterative reconstruction technique. CONTRAST:  13mL OMNIPAQUE IOHEXOL 300 MG/ML  SOLN COMPARISON:  Unenhanced head CT earlier today approximately 2-1/2 hours ago. FINDINGS: Postcontrast imaging of the brain.  Mild motion degradation. Normal enhancement of the brain.  No evidence of metastatic disease. Normal vascular enhancement. Normal arterial and venous enhancement. Ventricle size normal.  No midline shift. IMPRESSION: Normal enhancement of the brain by CT. Negative for metastatic disease and no acute abnormality. Electronically Signed   By: Franchot Gallo M.D.   On: 09/16/2021 17:03   CT ABDOMEN PELVIS W CONTRAST  Result Date: 09/16/2021 CLINICAL DATA:  Abdominal pain, acutem, nonlocalized. History of prostate cancer. EXAM: CT ABDOMEN AND PELVIS WITH CONTRAST TECHNIQUE: Multidetector CT imaging of the abdomen and pelvis was performed using the standard protocol following bolus administration of intravenous contrast. RADIATION DOSE REDUCTION: This exam was performed according to the departmental dose-optimization program which includes automated exposure control, adjustment of the mA and/or kV according to patient size and/or use of iterative reconstruction technique. CONTRAST:  196mL OMNIPAQUE IOHEXOL 300 MG/ML  SOLN COMPARISON:  12/04/2020 FINDINGS: Lower chest: Patchy parenchymal densities in the right upper lobe, right middle lobe and right lower lobe. Findings are compatible with pneumonia. No pleural effusions.  Bandlike density in the lingula could represent atelectasis. Hepatobiliary: Gallbladder is distended without surrounding inflammatory changes. Normal appearance of the liver without a discrete lesion. No biliary dilatation. Main portal venous system is patent. Pancreas: Unremarkable. No pancreatic ductal dilatation or surrounding inflammatory changes. Spleen: Normal in size without focal abnormality. Adrenals/Urinary Tract: Normal adrenal glands. Artifact from the patient's arms limits evaluation of the right kidney. Again noted are right renal cortical cysts. Prominent left renal sinus cyst. No suspicious renal lesions and no hydronephrosis. Normal appearance of the urinary bladder. Stomach/Bowel: Cecum is located in the lower anterior abdomen at the level of the umbilicus. No evidence for bowel distension or focal bowel inflammation. Normal appearance of the stomach. Vascular/Lymphatic: Atherosclerotic calcifications in the abdominal aorta without an aneurysm. No lymph node enlargement in the abdomen or pelvis. Reproductive: Stable small prostate. Other: Negative for free fluid. Negative for free air. Question a tiny umbilical hernia containing fat. Musculoskeletal: Again noted are multiple vertebral body compression fractures. There is vertebral body cement at T10, T12, L1 and L2. Chronic vertebral body compression fractures at T8 and T9. IMPRESSION: 1. Right lung pneumonia. Patchy disease in the right upper lobe, right middle lobe and right lower lobe. 2. No acute abnormality in the abdomen or pelvis. 3. Gallbladder distension without inflammatory changes. 4. Bilateral renal cysts. Limited evaluation of the kidneys due to streak artifact. 5. Multiple vertebral body compression fractures. Previous vertebral body augmentation procedures. Electronically Signed   By: Markus Daft M.D.   On: 09/16/2021 17:12   DG Chest Port 1 View  Result Date: 09/16/2021 CLINICAL DATA:  Possible aspiration.  Confusion. EXAM:  PORTABLE CHEST 1 VIEW COMPARISON:  08/15/2019 FINDINGS: Numerous leads and wires project over the chest. Midline trachea. Normal heart size for level of inspiration. Mild right hemidiaphragm elevation. Apical lordotic positioning. Similar appearance of relatively linear/wedge-shaped areas of bibasilar and left midlung airspace disease. IMPRESSION: Limited AP portable radiograph with low lung volumes and overlying wires and leads. Similar appearance of bilateral pulmonary opacities, favoring scarring. No new lobar consolidation. Electronically Signed   By: Abigail Miyamoto M.D.   On: 09/16/2021  11:22    DG Chest 2 View  Result Date: 09/23/2021 CLINICAL DATA:  Follow-up pneumonia EXAM: CHEST - 2 VIEW COMPARISON:  09/16/2021, 08/08/2019 FINDINGS: Mild cardiomegaly. Unchanged irregular and bandlike opacity throughout the lung bases, as seen on examinations dating back to 08/08/2019 and consistent with chronic scarring and or atelectasis. No acute appearing airspace opacity. Numerous wedge deformities of the thoracic and included upper lumbar spine, status post multilevel vertebral cement augmentation. IMPRESSION: 1. No acute appearing airspace opacity. 2. Unchanged chronic scarring and or atelectasis throughout the lung bases. Electronically Signed   By: Delanna Ahmadi M.D.   On: 09/23/2021 14:55     Assessment & Plan:   [name] Holder was seen today for hypertension.  Diagnoses and all orders for this visit:  Essential hypertension- BP is not adequately well controlled. Will increase the dose of the loop diuretic. -     furosemide (LASIX) 20 MG tablet; Take 1 tablet (20 mg total) by mouth 2 (two) times daily.  Pneumonia of both lungs due to infectious organism, unspecified part of lung- This has resolved. -     DG Chest 2 View; Future   I have changed [name] Holder's furosemide. I am also having him maintain his CALCIUM-VITAMIN D PO, Leuprolide Acetate (LUPRON IJ), Narcan, acetaminophen, Multiple  Vitamins-Minerals (CENTRUM SILVER 50+MEN PO), solifenacin, sodium chloride, fluticasone, abiraterone acetate, albuterol, Trelegy Ellipta, denosumab, olmesartan, carvedilol, polyethylene glycol powder, azelastine, predniSONE, albuterol, oxyCODONE, vitamin C, vitamin E, [place] Advanced Carotenoid, esomeprazole, cefUROXime, and budesonide-formoterol.  Meds ordered this encounter  Medications   furosemide (LASIX) 20 MG tablet    Sig: Take 1 tablet (20 mg total) by mouth 2 (two) times daily.    Dispense:  180 tablet    Refill:  1     Follow-up: Return in about 6 months (around 03/23/2022).  Scarlette Calico, MD

## 2021-09-25 ENCOUNTER — Ambulatory Visit: Payer: Medicare Other | Admitting: Internal Medicine

## 2021-10-06 ENCOUNTER — Other Ambulatory Visit: Payer: Self-pay | Admitting: Internal Medicine

## 2021-10-06 ENCOUNTER — Other Ambulatory Visit: Payer: Self-pay | Admitting: Oncology

## 2021-10-06 DIAGNOSIS — C7952 Secondary malignant neoplasm of bone marrow: Secondary | ICD-10-CM

## 2021-10-06 DIAGNOSIS — J4521 Mild intermittent asthma with (acute) exacerbation: Secondary | ICD-10-CM

## 2021-10-06 DIAGNOSIS — C7951 Secondary malignant neoplasm of bone: Secondary | ICD-10-CM

## 2021-10-06 DIAGNOSIS — J4520 Mild intermittent asthma, uncomplicated: Secondary | ICD-10-CM

## 2021-10-24 ENCOUNTER — Other Ambulatory Visit: Payer: Self-pay | Admitting: *Deleted

## 2021-10-24 DIAGNOSIS — Z8546 Personal history of malignant neoplasm of prostate: Secondary | ICD-10-CM

## 2021-10-24 MED ORDER — ABIRATERONE ACETATE 250 MG PO TABS: 1000.0000 mg | ORAL_TABLET | Freq: Every day | ORAL | 10 refills | Status: DC

## 2021-10-28 ENCOUNTER — Ambulatory Visit (INDEPENDENT_AMBULATORY_CARE_PROVIDER_SITE_OTHER): Payer: Medicare Other | Admitting: Internal Medicine

## 2021-10-28 ENCOUNTER — Encounter: Payer: Self-pay | Admitting: Internal Medicine

## 2021-10-28 VITALS — BP 118/76 | HR 80 | Temp 98.5°F | Ht 68.0 in

## 2021-10-28 DIAGNOSIS — R739 Hyperglycemia, unspecified: Secondary | ICD-10-CM

## 2021-10-28 DIAGNOSIS — E785 Hyperlipidemia, unspecified: Secondary | ICD-10-CM

## 2021-10-28 DIAGNOSIS — Z0000 Encounter for general adult medical examination without abnormal findings: Secondary | ICD-10-CM

## 2021-10-28 DIAGNOSIS — I10 Essential (primary) hypertension: Secondary | ICD-10-CM

## 2021-10-28 DIAGNOSIS — C61 Malignant neoplasm of prostate: Secondary | ICD-10-CM

## 2021-10-28 DIAGNOSIS — E222 Syndrome of inappropriate secretion of antidiuretic hormone: Secondary | ICD-10-CM

## 2021-10-28 DIAGNOSIS — D638 Anemia in other chronic diseases classified elsewhere: Secondary | ICD-10-CM | POA: Diagnosis not present

## 2021-10-28 DIAGNOSIS — Z0001 Encounter for general adult medical examination with abnormal findings: Secondary | ICD-10-CM

## 2021-10-28 LAB — CBC WITH DIFFERENTIAL/PLATELET
Basophils Absolute: 0.1 10*3/uL (ref 0.0–0.1)
Basophils Relative: 0.8 % (ref 0.0–3.0)
Eosinophils Absolute: 0.2 10*3/uL (ref 0.0–0.7)
Eosinophils Relative: 1.3 % (ref 0.0–5.0)
HCT: 36.1 % — ABNORMAL LOW (ref 39.0–52.0)
Hemoglobin: 12.5 g/dL — ABNORMAL LOW (ref 13.0–17.0)
Lymphocytes Relative: 20.5 % (ref 12.0–46.0)
Lymphs Abs: 2.4 10*3/uL (ref 0.7–4.0)
MCHC: 34.6 g/dL (ref 30.0–36.0)
MCV: 84.9 fl (ref 78.0–100.0)
Monocytes Absolute: 0.9 10*3/uL (ref 0.1–1.0)
Monocytes Relative: 7.4 % (ref 3.0–12.0)
Neutro Abs: 8.3 10*3/uL — ABNORMAL HIGH (ref 1.4–7.7)
Neutrophils Relative %: 70 % (ref 43.0–77.0)
Platelets: 345 10*3/uL (ref 150.0–400.0)
RBC: 4.25 Mil/uL (ref 4.22–5.81)
RDW: 14.8 % (ref 11.5–15.5)
WBC: 11.8 10*3/uL — ABNORMAL HIGH (ref 4.0–10.5)

## 2021-10-28 LAB — BASIC METABOLIC PANEL
BUN: 21 mg/dL (ref 6–23)
CO2: 23 mEq/L (ref 19–32)
Calcium: 9.6 mg/dL (ref 8.4–10.5)
Chloride: 101 mEq/L (ref 96–112)
Creatinine, Ser: 1 mg/dL (ref 0.40–1.50)
GFR: 77.74 mL/min (ref >60.00)
Glucose, Bld: 106 mg/dL — ABNORMAL HIGH (ref 70–99)
Potassium: 4 mEq/L (ref 3.5–5.1)
Sodium: 134 mEq/L — ABNORMAL LOW (ref 135–145)

## 2021-10-28 LAB — LIPID PANEL
Cholesterol: 173 mg/dL (ref 0–200)
HDL: 38.1 mg/dL — ABNORMAL LOW (ref >39.00)
LDL Cholesterol: 101 mg/dL — ABNORMAL HIGH (ref 0–99)
NonHDL: 134.57
Total CHOL/HDL Ratio: 5
Triglycerides: 167 mg/dL — ABNORMAL HIGH (ref 0.0–149.0)
VLDL: 33.4 mg/dL (ref 0.0–40.0)

## 2021-10-28 LAB — HEMOGLOBIN A1C: Hgb A1c MFr Bld: 5.7 % (ref 4.6–6.5)

## 2021-10-28 NOTE — Progress Notes (Signed)
? ?Subjective:  ?Patient ID: [name] Holder, male    DOB: 18-Oct-1955  Age: 66 y.o. MRN: [id number] ? ?CC: Annual Exam, Hypertension, and COPD ? ? ?HPI ?Glendon Axe presents for a CPX and f/up - ? ?His cough has resolved and he has a good appetite.  He denies shortness of breath, chest pain, diaphoresis, dizziness, lightheadedness, or edema. ? ?Outpatient Medications Prior to Visit  ?Medication Sig Dispense Refill  ? abiraterone acetate (ZYTIGA) 250 MG tablet Take 4 tablets (1,000 mg total) by mouth daily. 120 tablet 10  ? acetaminophen (TYLENOL) 325 MG tablet Take 325 mg by mouth daily as needed for fever.    ? albuterol (PROVENTIL) (2.5 MG/3ML) 0.083% nebulizer solution NEBULIZE 1 VIAL EVERY 6-8 HOURS AS NEEDED FOR WHEEZING OR SHORTNESS OF BREATH 360 mL 1  ? albuterol (VENTOLIN HFA) 108 (90 Base) MCG/ACT inhaler Inhale 2 puffs in lungs every 6 hours as needed for wheezing. 8.5 g 1  ? azelastine (ASTELIN) 0.1 % nasal spray Place 2 puffs into the nose in the morning and at bedtime. 90 mL 1  ? CALCIUM-VITAMIN D PO Take 1 tablet by mouth 2 (two) times daily.     ? carvedilol (COREG) 3.125 MG tablet TAKE (1) TABLET TWICE A DAY WITH MEALS (BREAKFAST AND SUPPER) 180 tablet 1  ? denosumab (PROLIA) 60 MG/ML SOSY injection Inject 60 mg into the skin every 6 (six) months.    ? esomeprazole (NEXIUM) 20 MG capsule Take 20 mg by mouth daily at 12 noon.    ? fluticasone (FLONASE) 50 MCG/ACT nasal spray Place 2 sprays into both nostrils daily. 48 g 1  ? Fluticasone-Umeclidin-Vilant (TRELEGY ELLIPTA) 100-62.5-25 MCG/INH AEPB INHALE 1 PUFF INTO LUNGS DAILY 60 each 11  ? furosemide (LASIX) 20 MG tablet Take 1 tablet (20 mg total) by mouth 2 (two) times daily. 180 tablet 1  ? Leuprolide Acetate (LUPRON IJ) Inject 1 Dose as directed every 4 (four) months. This is administered by [facility]    ? Multiple Vitamins-Minerals (CENTRUM SILVER 50+MEN PO) Take 1 tablet by mouth daily.    ? NARCAN 4 MG/0.1ML LIQD nasal spray kit  Place 0.4 mg into the nose once as needed (overdose).     ? olmesartan (BENICAR) 40 MG tablet Take 1 tablet (40 mg total) by mouth daily. 90 tablet 1  ? oxyCODONE (OXY IR/ROXICODONE) 5 MG immediate release tablet TAKE ONE TABLET EVERY 4 HOURS AS NEEDED 180 tablet 0  ? polyethylene glycol powder (GLYCOLAX/MIRALAX) 17 GM/SCOOP powder Take 17 g (1 capful) by mouth daily as directed. 510 g 6  ? predniSONE (DELTASONE) 5 MG tablet TAKE 1 TABLET 2 TIMES A DAY 90 tablet 3  ? sodium chloride 1 g tablet Take 1 g by mouth 2 (two) times daily with a meal.    ? solifenacin (VESICARE) 5 MG tablet Take 5 mg by mouth daily.    ? Specialty Vitamins Products (VISTA ADVANCED CAROTENOID) CAPS Take 1 capsule by mouth every evening.    ? vitamin C (ASCORBIC ACID) 500 MG tablet Take 500 mg by mouth every evening.    ? vitamin E 180 MG (400 UNITS) capsule Take 400 Units by mouth every evening.    ? budesonide-formoterol (SYMBICORT) 160-4.5 MCG/ACT inhaler     ? ?No facility-administered medications prior to visit.  ? ? ?ROS ?Review of Systems  ?Constitutional:  Positive for fatigue. Negative for diaphoresis.  ?HENT: Negative.    ?Eyes: Negative.   ?Respiratory:  Negative for  cough, chest tightness and wheezing.   ?Cardiovascular:  Negative for chest pain, palpitations and leg swelling.  ?Gastrointestinal:  Negative for abdominal pain, constipation, diarrhea, nausea and vomiting.  ?Endocrine: Negative.   ?Genitourinary:  Negative for difficulty urinating.  ?Musculoskeletal:  Positive for arthralgias and back pain.  ?Skin: Negative.  Negative for color change and pallor.  ?Neurological:  Negative for dizziness, weakness, light-headedness and headaches.  ?Hematological:  Negative for adenopathy. Does not bruise/bleed easily.  ?Psychiatric/Behavioral: Negative.    ? ?Objective:  ?BP 118/76 (BP Location: Right Arm, Patient Position: Sitting, Cuff Size: Large)   Pulse 80   Temp 98.5 ?F (36.9 ?C) (Oral)   Ht _0  (1.727 m)   SpO2 94%   BMI  34.47 kg/m?  ? ?BP Readings from Last 3 Encounters:  ?10/28/21 118/76  ?09/23/21 (!) 142/86  ?09/20/21 (!) 177/80  ? ? ?Wt Readings from Last 3 Encounters:  ?09/20/21 226 lb 11.2 oz (102.8 kg)  ?07/30/21 220 lb 4.8 oz (99.9 kg)  ?04/29/21 222 lb (100.7 kg)  ? ? ?Physical Exam ?Vitals reviewed.  ?Constitutional:   ?   General: He is not in acute distress. ?   Appearance: He is ill-appearing. He is not toxic-appearing or diaphoretic.  ?HENT:  ?   Nose: Nose normal.  ?   Mouth/Throat:  ?   Mouth: Mucous membranes are moist.  ?Eyes:  ?   General: No scleral icterus. ?   Conjunctiva/sclera: Conjunctivae normal.  ?Cardiovascular:  ?   Rate and Rhythm: Normal rate and regular rhythm.  ?   Heart sounds: No murmur heard. ?Pulmonary:  ?   Effort: Pulmonary effort is normal.  ?   Breath sounds: No stridor. No wheezing, rhonchi or rales.  ?Abdominal:  ?   General: Abdomen is flat.  ?   Palpations: There is no mass.  ?   Tenderness: There is no abdominal tenderness. There is no guarding.  ?   Hernia: No hernia is present.  ?Musculoskeletal:     ?   General: Normal range of motion.  ?   Cervical back: Neck supple.  ?   Right lower leg: No edema.  ?   Left lower leg: No edema.  ?Lymphadenopathy:  ?   Cervical: No cervical adenopathy.  ?Skin: ?   General: Skin is warm and dry.  ?Neurological:  ?   General: No focal deficit present.  ?   Mental Status: He is alert.  ?Psychiatric:     ?   Mood and Affect: Mood normal.     ?   Behavior: Behavior normal.  ? ? ?Lab Results  ?Component Value Date  ? WBC 11.8 (H) 10/28/2021  ? HGB 12.5 (L) 10/28/2021  ? HCT 36.1 (L) 10/28/2021  ? PLT 345.0 10/28/2021  ? GLUCOSE 106 (H) 10/28/2021  ? CHOL 173 10/28/2021  ? TRIG 167.0 (H) 10/28/2021  ? HDL 38.10 (L) 10/28/2021  ? LDLCALC 101 (H) 10/28/2021  ? ALT 18 09/17/2021  ? AST 15 09/17/2021  ? NA 134 (L) 10/28/2021  ? K 4.0 10/28/2021  ? CL 101 10/28/2021  ? CREATININE 1.00 10/28/2021  ? BUN 21 10/28/2021  ? CO2 23 10/28/2021  ? TSH 0.86 09/30/2020   ? PSA 0.7 07/30/2021  ? INR 1.1 09/17/2021  ? HGBA1C 5.7 10/28/2021  ? ? ?CT Head Wo Contrast ? ?Result Date: 09/16/2021 ?CLINICAL DATA:  Provided history: Brain metastases suspected. Additional history provided by scanning technologist: Patient's spouse reports patient aspirated last night  while sleeping, confusion this morning. EXAM: CT HEAD WITHOUT CONTRAST TECHNIQUE: Contiguous axial images were obtained from the base of the skull through the vertex without intravenous contrast. RADIATION DOSE REDUCTION: This exam was performed according to the departmental dose-optimization program which includes automated exposure control, adjustment of the mA and/or kV according to patient size and/or use of iterative reconstruction technique. COMPARISON:  Head CT 08/15/2019.  Brain MRI 12/27/2018. FINDINGS: Brain: Mild generalized cerebral and cerebellar atrophy. Minimal chronic small vessel ischemic changes within the cerebral white matter, better appreciated on the brain MRI of 12/27/2018. There is no acute intracranial hemorrhage. No demarcated cortical infarct. No extra-axial fluid collection. No evidence of an intracranial mass. No midline shift. Vascular: No hyperdense vessel. Atherosclerotic calcifications. Skull: Normal. Negative for fracture or focal lesion. Sinuses/Orbits: Visualized orbits show no acute finding. No significant paranasal sinus disease at the imaged levels. IMPRESSION: No evidence of acute intracranial abnormality. No appreciable intracranial metastatic disease on this non-contrast head CT. However, please note a brain MRI with contrast would have greater sensitivity. Minimal chronic small vessel ischemic changes within the cerebral white matter. Mild generalized cerebral and cerebellar atrophy. Electronically Signed   By: Kellie Simmering D.O.   On: 09/16/2021 14:41  ? ?CT Head W or Wo Contrast ? ?Result Date: 09/16/2021 ?CLINICAL DATA:  Brain metastasis suspected.  Altered mental status. EXAM: CT HEAD  WITHOUT AND WITH CONTRAST TECHNIQUE: Contiguous axial images were obtained from the base of the skull through the vertex without and with intravenous contrast. RADIATION DOSE REDUCTION: This exam was performe

## 2021-10-28 NOTE — Patient Instructions (Signed)

## 2021-10-30 ENCOUNTER — Inpatient Hospital Stay: Payer: Medicare Other | Admitting: Oncology

## 2021-10-30 ENCOUNTER — Inpatient Hospital Stay: Payer: Medicare Other

## 2021-10-31 ENCOUNTER — Telehealth: Payer: Self-pay | Admitting: Oncology

## 2021-10-31 NOTE — Telephone Encounter (Signed)
Rescheduled 04/06 appointment to 04/14 per patient's request, patient is notified. ?

## 2021-11-02 LAB — FRUCTOSAMINE: Fructosamine: 232 umol/L (ref 205–285)

## 2021-11-04 ENCOUNTER — Encounter: Payer: Self-pay | Admitting: Oncology

## 2021-11-04 ENCOUNTER — Other Ambulatory Visit (HOSPITAL_COMMUNITY): Payer: Self-pay

## 2021-11-07 ENCOUNTER — Inpatient Hospital Stay (HOSPITAL_BASED_OUTPATIENT_CLINIC_OR_DEPARTMENT_OTHER): Payer: Medicare Other | Admitting: Oncology

## 2021-11-07 ENCOUNTER — Inpatient Hospital Stay: Payer: Medicare Other | Attending: Oncology

## 2021-11-07 ENCOUNTER — Inpatient Hospital Stay: Payer: Medicare Other

## 2021-11-07 VITALS — BP 114/74 | HR 90 | Temp 97.8°F | Resp 18 | Ht 68.0 in | Wt 212.4 lb

## 2021-11-07 DIAGNOSIS — C7951 Secondary malignant neoplasm of bone: Secondary | ICD-10-CM

## 2021-11-07 DIAGNOSIS — Z79899 Other long term (current) drug therapy: Secondary | ICD-10-CM | POA: Diagnosis not present

## 2021-11-07 DIAGNOSIS — Z5111 Encounter for antineoplastic chemotherapy: Secondary | ICD-10-CM | POA: Insufficient documentation

## 2021-11-07 DIAGNOSIS — C61 Malignant neoplasm of prostate: Secondary | ICD-10-CM | POA: Diagnosis not present

## 2021-11-07 DIAGNOSIS — C7952 Secondary malignant neoplasm of bone marrow: Secondary | ICD-10-CM

## 2021-11-07 DIAGNOSIS — Z8546 Personal history of malignant neoplasm of prostate: Secondary | ICD-10-CM

## 2021-11-07 LAB — CBC WITH DIFFERENTIAL (CANCER CENTER ONLY)
Abs Immature Granulocytes: 0.06 10*3/uL (ref 0.00–0.07)
Basophils Absolute: 0 10*3/uL (ref 0.0–0.1)
Basophils Relative: 0 %
Eosinophils Absolute: 0.1 10*3/uL (ref 0.0–0.5)
Eosinophils Relative: 1 %
HCT: 38.9 % — ABNORMAL LOW (ref 39.0–52.0)
Hemoglobin: 12.7 g/dL — ABNORMAL LOW (ref 13.0–17.0)
Immature Granulocytes: 1 %
Lymphocytes Relative: 20 %
Lymphs Abs: 2.1 10*3/uL (ref 0.7–4.0)
MCH: 28.7 pg (ref 26.0–34.0)
MCHC: 32.6 g/dL (ref 30.0–36.0)
MCV: 87.8 fL (ref 80.0–100.0)
Monocytes Absolute: 0.7 10*3/uL (ref 0.1–1.0)
Monocytes Relative: 7 %
Neutro Abs: 7.5 10*3/uL (ref 1.7–7.7)
Neutrophils Relative %: 71 %
Platelet Count: 307 10*3/uL (ref 150–400)
RBC: 4.43 MIL/uL (ref 4.22–5.81)
RDW: 13.8 % (ref 11.5–15.5)
WBC Count: 10.5 10*3/uL (ref 4.0–10.5)
nRBC: 0 % (ref 0.0–0.2)

## 2021-11-07 LAB — CMP (CANCER CENTER ONLY)
ALT: 15 U/L (ref 0–44)
AST: 11 U/L — ABNORMAL LOW (ref 15–41)
Albumin: 4.2 g/dL (ref 3.5–5.0)
Alkaline Phosphatase: 50 U/L (ref 38–126)
Anion gap: 6 (ref 5–15)
BUN: 25 mg/dL — ABNORMAL HIGH (ref 8–23)
CO2: 27 mmol/L (ref 22–32)
Calcium: 9.8 mg/dL (ref 8.9–10.3)
Chloride: 103 mmol/L (ref 98–111)
Creatinine: 0.94 mg/dL (ref 0.61–1.24)
GFR, Estimated: >60 mL/min (ref >60)
Glucose, Bld: 111 mg/dL — ABNORMAL HIGH (ref 70–99)
Potassium: 4.5 mmol/L (ref 3.5–5.1)
Sodium: 136 mmol/L (ref 135–145)
Total Bilirubin: 0.4 mg/dL (ref 0.3–1.2)
Total Protein: 7.1 g/dL (ref 6.5–8.1)

## 2021-11-07 MED ORDER — LEUPROLIDE ACETATE (3 MONTH) 22.5 MG ~~LOC~~ KIT
22.5000 mg | PACK | Freq: Once | SUBCUTANEOUS | Status: AC
  Administered 2021-11-07: 22.5 mg via SUBCUTANEOUS
  Filled 2021-11-07: qty 22.5

## 2021-11-07 NOTE — Progress Notes (Signed)
Hematology and Oncology Follow Up Visit ? ?[name] Holder ?[id number] ?1955-07-29 66 y.o. ?11/07/2021 1:20 PM ?[name] Holder, MDJones, [name], MD  ? 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  He presented with localized disease in 2014. ? ?Prior Therapy: ? ?Androgen deprivation therapy in 2014.  He developed castration resistant in 2018. ? ?He is status post T10, T12, kyphoplasty completed on January 22 of 2020. ? ?Current therapy: ? ?Zytiga 1000 mg daily with prednisone at 5 mg daily started in 2018. ? ?Eligard 22.5 mg every 3 months.  Next Eligard will be given today. ? ?Interim History: [name] presents today for a follow-up evaluation.  Since the last visit, he reports feeling well without any major complaints.  He recovered from his recent hospitalization and back to his baseline function.  He had denied any worsening bone pain or pathological fractures.  He denies any falls or syncope.  Continues to tolerate Zytiga reasonably well. ? ? ? ? ? ? ? ? ? ?Medications: Reviewed without changes. ?Current Outpatient Medications  ?Medication Sig Dispense Refill  ? abiraterone acetate (ZYTIGA) 250 MG tablet Take 4 tablets (1,000 mg total) by mouth daily. 120 tablet 10  ? acetaminophen (TYLENOL) 325 MG tablet Take 325 mg by mouth daily as needed for fever.    ? albuterol (PROVENTIL) (2.5 MG/3ML) 0.083% nebulizer solution NEBULIZE 1 VIAL EVERY 6-8 HOURS AS NEEDED FOR WHEEZING OR SHORTNESS OF BREATH 360 mL 1  ? albuterol (VENTOLIN HFA) 108 (90 Base) MCG/ACT inhaler Inhale 2 puffs in lungs every 6 hours as needed for wheezing. 8.5 g 1  ? azelastine (ASTELIN) 0.1 % nasal spray Place 2 puffs into the nose in the morning and at bedtime. 90 mL 1  ? CALCIUM-VITAMIN D PO Take 1 tablet by mouth 2 (two) times daily.     ? carvedilol (COREG) 3.125 MG tablet TAKE (1) TABLET TWICE A DAY WITH MEALS (BREAKFAST AND SUPPER) 180 tablet 1   ? denosumab (PROLIA) 60 MG/ML SOSY injection Inject 60 mg into the skin every 6 (six) months.    ? esomeprazole (NEXIUM) 20 MG capsule Take 20 mg by mouth daily at 12 noon.    ? fluticasone (FLONASE) 50 MCG/ACT nasal spray Place 2 sprays into both nostrils daily. 48 g 1  ? Fluticasone-Umeclidin-Vilant (TRELEGY ELLIPTA) 100-62.5-25 MCG/INH AEPB INHALE 1 PUFF INTO LUNGS DAILY 60 each 11  ? furosemide (LASIX) 20 MG tablet Take 1 tablet (20 mg total) by mouth 2 (two) times daily. 180 tablet 1  ? Leuprolide Acetate (LUPRON IJ) Inject 1 Dose as directed every 4 (four) months. This is administered by [facility]    ? Multiple Vitamins-Minerals (CENTRUM SILVER 50+MEN PO) Take 1 tablet by mouth daily.    ? NARCAN 4 MG/0.1ML LIQD nasal spray kit Place 0.4 mg into the nose once as needed (overdose).     ? olmesartan (BENICAR) 40 MG tablet Take 1 tablet (40 mg total) by mouth daily. 90 tablet 1  ? oxyCODONE (OXY IR/ROXICODONE) 5 MG immediate release tablet TAKE ONE TABLET EVERY 4 HOURS AS NEEDED 180 tablet 0  ? polyethylene glycol powder (GLYCOLAX/MIRALAX) 17 GM/SCOOP powder Take 17 g (1 capful) by mouth daily as directed. 510 g 6  ? predniSONE (DELTASONE) 5 MG tablet TAKE 1 TABLET 2 TIMES A DAY 90 tablet 3  ? sodium chloride 1 g tablet Take 1 g by mouth 2 (two) times daily with a meal.    ?  solifenacin (VESICARE) 5 MG tablet Take 5 mg by mouth daily.    ? Specialty Vitamins Products (VISTA ADVANCED CAROTENOID) CAPS Take 1 capsule by mouth every evening.    ? vitamin C (ASCORBIC ACID) 500 MG tablet Take 500 mg by mouth every evening.    ? vitamin E 180 MG (400 UNITS) capsule Take 400 Units by mouth every evening.    ? ?No current facility-administered medications for this visit.  ? ? ? ?Allergies:  ?Allergies  ?Allergen Reactions  ? Amlodipine Swelling  ?  edema  ? Tetracycline Rash  ? ? ? ? ?Physical Exam: ? ? ? ? ? ?Blood pressure 114/74, pulse 90, temperature 97.8 ?F (36.6 ?C), temperature source Temporal, resp. rate  18, height $RemoveBe'5\' 8"'FRxUYGnFp$  (1.727 m), weight 212 lb 6.4 oz (96.3 kg), SpO2 100 %. ? ? ? ? ?ECOG: 1 ? ? ? ?General appearance: Comfortable appearing without any discomfort ?Head: Normocephalic without any trauma ?Oropharynx: Mucous membranes are moist and pink without any thrush or ulcers. ?Eyes: Pupils are equal and round reactive to light. ?Lymph nodes: No cervical, supraclavicular, inguinal or axillary lymphadenopathy.   ?Heart:regular rate and rhythm.  S1 and S2 without leg edema. ?Lung: Clear without any rhonchi or wheezes.  No dullness to percussion. ?Abdomin: Soft, nontender, nondistended with good bowel sounds.  No hepatosplenomegaly. ?Musculoskeletal: No joint deformity or effusion.  Full range of motion noted. ?Neurological: No deficits noted on motor, sensory and deep tendon reflex exam. ?Skin: No petechial rash or dryness.  Appeared moist.  ? ? ? ? ? ? ? ? ? ? ? ?Lab Results: ?Lab Results  ?Component Value Date  ? WBC 11.8 (H) 10/28/2021  ? HGB 12.5 (L) 10/28/2021  ? HCT 36.1 (L) 10/28/2021  ? MCV 84.9 10/28/2021  ? PLT 345.0 10/28/2021  ? ?  Chemistry   ?   ?Component Value Date/Time  ? NA 134 (L) 10/28/2021 1135  ? K 4.0 10/28/2021 1135  ? CL 101 10/28/2021 1135  ? CO2 23 10/28/2021 1135  ? BUN 21 10/28/2021 1135  ? CREATININE 1.00 10/28/2021 1135  ? CREATININE 1.03 07/30/2021 1446  ?    ?Component Value Date/Time  ? CALCIUM 9.6 10/28/2021 1135  ? ALKPHOS 41 09/17/2021 0404  ? AST 15 09/17/2021 0404  ? AST 16 07/30/2021 1446  ? ALT 18 09/17/2021 0404  ? ALT 22 07/30/2021 1446  ? BILITOT 0.6 09/17/2021 0404  ? BILITOT 0.6 07/30/2021 1446  ?  ? ? ? ? ? Latest Reference Range & Units 07/30/21 14:46  ?Prostate Specific Ag, Serum 0.0 - 4.0 ng/mL 0.7  ? ? ? ? ? ?Impression and Plan: ? ?66 year old man with: ?  ?1.    Advanced prostate cancer with disease to the bone and lymphadenopathy diagnosed in 2018.  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 ? ?He continues to be on Zytiga with overall stable disease and  very slow rise in his PSA.  Risks and benefits of continuing this treatment were discussed at this time.  Alternative treatment options including Taxotere chemotherapy were reiterated.  He is agreeable to continue. CT scan obtained in February 2023 was also reviewed and showed no evidence of progression of disease. ?  ?2.  Compression fracture tween T10 and T12: No exacerbation noted since the last visit. ? ?  ?3.  Bone directed therapy: He is currently on Prolia and received it in January 2023.  This will be repeated in July 2023. ?  ?4.  Pain:  Related to his compression fracture and pain is manageable.  He is currently on oxycodone. ?  ?5.  Prognosis and goals of care: His disease is incurable although aggressive measures are warranted given his reasonable performance status. ? ? ? ?  ?6.  Follow-up: He will return in 3 months for a follow-up evaluation. ?  ?30  minutes were dedicated to this encounter.  The time was spent on reviewing laboratory data, treatment choices, addressing complications related to cancer and cancer therapy use. ? ?  ?  ?  ? ?Zola Button, MD ?4/14/20231:20 PM ?

## 2021-11-08 LAB — PROSTATE-SPECIFIC AG, SERUM (LABCORP): Prostate Specific Ag, Serum: 0.7 ng/mL (ref 0.0–4.0)

## 2021-11-10 ENCOUNTER — Telehealth: Payer: Self-pay

## 2021-11-10 NOTE — Telephone Encounter (Signed)
-----   Message from Wyatt Portela, MD sent at 11/10/2021  9:21 AM EDT ----- ?Please let him know his PSA is stable.  ?

## 2021-11-10 NOTE — Telephone Encounter (Signed)
Called pt to let him know that his PSA is stable.  ?

## 2021-11-25 ENCOUNTER — Other Ambulatory Visit: Payer: Self-pay | Admitting: Oncology

## 2021-11-25 DIAGNOSIS — C7951 Secondary malignant neoplasm of bone: Secondary | ICD-10-CM

## 2021-12-01 ENCOUNTER — Telehealth: Payer: Self-pay | Admitting: *Deleted

## 2021-12-01 NOTE — Telephone Encounter (Signed)
[name] Holder states pharmacy is unable to get any Oxycodone 5 mg tablets. Pharmacy is suggesting prescribing Oxycodone 10 mg and take 1/2 tab.  ?

## 2021-12-02 ENCOUNTER — Other Ambulatory Visit: Payer: Self-pay | Admitting: Oncology

## 2021-12-02 MED ORDER — OXYCODONE HCL 10 MG PO TABS: 5.0000 mg | ORAL_TABLET | ORAL | 0 refills | Status: DC | PRN

## 2021-12-05 ENCOUNTER — Other Ambulatory Visit: Payer: Self-pay | Admitting: Internal Medicine

## 2021-12-05 ENCOUNTER — Other Ambulatory Visit: Payer: Self-pay | Admitting: Oncology

## 2021-12-05 DIAGNOSIS — J4521 Mild intermittent asthma with (acute) exacerbation: Secondary | ICD-10-CM

## 2021-12-05 DIAGNOSIS — Z8546 Personal history of malignant neoplasm of prostate: Secondary | ICD-10-CM

## 2021-12-24 ENCOUNTER — Other Ambulatory Visit: Payer: Self-pay | Admitting: Internal Medicine

## 2021-12-24 DIAGNOSIS — Z0000 Encounter for general adult medical examination without abnormal findings: Secondary | ICD-10-CM

## 2021-12-24 DIAGNOSIS — Z9109 Other allergy status, other than to drugs and biological substances: Secondary | ICD-10-CM

## 2021-12-27 DIAGNOSIS — T17908A Unspecified foreign body in respiratory tract, part unspecified causing other injury, initial encounter: Secondary | ICD-10-CM | POA: Diagnosis not present

## 2021-12-31 ENCOUNTER — Ambulatory Visit (INDEPENDENT_AMBULATORY_CARE_PROVIDER_SITE_OTHER): Payer: Medicare Other

## 2021-12-31 ENCOUNTER — Ambulatory Visit (INDEPENDENT_AMBULATORY_CARE_PROVIDER_SITE_OTHER): Payer: Medicare Other | Admitting: Internal Medicine

## 2021-12-31 ENCOUNTER — Encounter: Payer: Self-pay | Admitting: Internal Medicine

## 2021-12-31 VITALS — BP 116/72 | HR 73 | Temp 98.2°F | Ht 68.0 in

## 2021-12-31 DIAGNOSIS — R052 Subacute cough: Secondary | ICD-10-CM

## 2021-12-31 DIAGNOSIS — R0609 Other forms of dyspnea: Secondary | ICD-10-CM | POA: Diagnosis not present

## 2021-12-31 DIAGNOSIS — D638 Anemia in other chronic diseases classified elsewhere: Secondary | ICD-10-CM

## 2021-12-31 DIAGNOSIS — R059 Cough, unspecified: Secondary | ICD-10-CM | POA: Diagnosis not present

## 2021-12-31 DIAGNOSIS — R079 Chest pain, unspecified: Secondary | ICD-10-CM | POA: Diagnosis not present

## 2021-12-31 DIAGNOSIS — J439 Emphysema, unspecified: Secondary | ICD-10-CM | POA: Diagnosis not present

## 2021-12-31 DIAGNOSIS — I10 Essential (primary) hypertension: Secondary | ICD-10-CM

## 2021-12-31 DIAGNOSIS — J4520 Mild intermittent asthma, uncomplicated: Secondary | ICD-10-CM

## 2021-12-31 DIAGNOSIS — R0602 Shortness of breath: Secondary | ICD-10-CM | POA: Diagnosis not present

## 2021-12-31 LAB — CBC WITH DIFFERENTIAL/PLATELET
Basophils Absolute: 0.1 10*3/uL (ref 0.0–0.1)
Basophils Relative: 0.6 % (ref 0.0–3.0)
Eosinophils Absolute: 0.2 10*3/uL (ref 0.0–0.7)
Eosinophils Relative: 1.5 % (ref 0.0–5.0)
HCT: 36.8 % — ABNORMAL LOW (ref 39.0–52.0)
Hemoglobin: 12.3 g/dL — ABNORMAL LOW (ref 13.0–17.0)
Lymphocytes Relative: 16.9 % (ref 12.0–46.0)
Lymphs Abs: 1.7 10*3/uL (ref 0.7–4.0)
MCHC: 33.4 g/dL (ref 30.0–36.0)
MCV: 87.2 fl (ref 78.0–100.0)
Monocytes Absolute: 0.8 10*3/uL (ref 0.1–1.0)
Monocytes Relative: 7.8 % (ref 3.0–12.0)
Neutro Abs: 7.5 10*3/uL (ref 1.4–7.7)
Neutrophils Relative %: 73.2 % (ref 43.0–77.0)
Platelets: 262 10*3/uL (ref 150.0–400.0)
RBC: 4.22 Mil/uL (ref 4.22–5.81)
RDW: 14.8 % (ref 11.5–15.5)
WBC: 10.3 10*3/uL (ref 4.0–10.5)

## 2021-12-31 LAB — TROPONIN I (HIGH SENSITIVITY): High Sens Troponin I: 4 ng/L (ref 2–17)

## 2021-12-31 LAB — BRAIN NATRIURETIC PEPTIDE: Pro B Natriuretic peptide (BNP): 51 pg/mL (ref 0.0–100.0)

## 2021-12-31 NOTE — Patient Instructions (Signed)

## 2021-12-31 NOTE — Progress Notes (Signed)
Subjective:  Patient ID: [name] Holder, male    DOB: [birth year]  Age: 66 y.o. MRN: [id number]  CC: No chief complaint on file.   HPI [name] Holder presents for f/up -  He complains of a 5-day history of nonproductive cough and shortness of breath.  He thinks there may have been an aspiration event.  He was seen at an urgent care center but a chest x-ray was not done.  He has been taking Augmentin and is feeling better.  He denies chest pain, night sweats, hemoptysis, fever, chills, odynophagia, or dysphagia.  He has had some DOE.  Outpatient Medications Prior to Visit  Medication Sig Dispense Refill   abiraterone acetate (ZYTIGA) 250 MG tablet Take 4 tablets (1,000 mg total) by mouth daily. 120 tablet 10   acetaminophen (TYLENOL) 325 MG tablet Take 325 mg by mouth daily as needed for fever.     albuterol (PROVENTIL) (2.5 MG/3ML) 0.083% nebulizer solution NEBULIZE 1 VIAL EVERY 6-8 HOURS AS NEEDED FOR WHEEZING OR SHORTNESS OF BREATH 360 mL 1   azelastine (ASTELIN) 0.1 % nasal spray Place 2 puffs into the nose in the morning and at bedtime. 90 mL 1   CALCIUM-VITAMIN D PO Take 1 tablet by mouth 2 (two) times daily.      carvedilol (COREG) 3.125 MG tablet TAKE (1) TABLET TWICE A DAY WITH MEALS (BREAKFAST AND SUPPER) 180 tablet 1   denosumab (PROLIA) 60 MG/ML SOSY injection Inject 60 mg into the skin every 6 (six) months.     esomeprazole (NEXIUM) 20 MG capsule Take 20 mg by mouth daily at 12 noon.     fluticasone (FLONASE) 50 MCG/ACT nasal spray Place 2 sprays into both nostrils daily. 48 g 1   furosemide (LASIX) 20 MG tablet Take 1 tablet (20 mg total) by mouth 2 (two) times daily. 180 tablet 1   Leuprolide Acetate (LUPRON IJ) Inject 1 Dose as directed every 4 (four) months. This is administered by [facility]     Multiple Vitamins-Minerals (CENTRUM SILVER 50+MEN PO) Take 1 tablet by mouth daily.     NARCAN 4 MG/0.1ML LIQD nasal spray kit Place 0.4 mg into the nose once as needed  (overdose).      Oxycodone HCl 10 MG TABS Take 0.5 tablets (5 mg total) by mouth every 4 (four) hours as needed. 90 tablet 0   polyethylene glycol powder (GLYCOLAX/MIRALAX) 17 GM/SCOOP powder Take 17 g (1 capful) by mouth daily as directed. 510 g 6   predniSONE (DELTASONE) 5 MG tablet TAKE 1 TABLET 2 TIMES A DAY 90 tablet 3   sodium chloride 1 g tablet Take 1 g by mouth 2 (two) times daily with a meal.     solifenacin (VESICARE) 5 MG tablet Take 5 mg by mouth daily.     Specialty Vitamins Products (VISTA ADVANCED CAROTENOID) CAPS Take 1 capsule by mouth every evening.     vitamin C (ASCORBIC ACID) 500 MG tablet Take 500 mg by mouth every evening.     vitamin E 180 MG (400 UNITS) capsule Take 400 Units by mouth every evening.     albuterol (VENTOLIN HFA) 108 (90 Base) MCG/ACT inhaler Inhale 2 puffs in lungs every 6 hours as needed for wheezing. 8.5 g 1   Fluticasone-Umeclidin-Vilant (TRELEGY ELLIPTA) 100-62.5-25 MCG/INH AEPB INHALE 1 PUFF INTO LUNGS DAILY 60 each 11   olmesartan (BENICAR) 40 MG tablet Take 1 tablet (40 mg total) by mouth daily. 90 tablet 1   No  facility-administered medications prior to visit.    ROS Review of Systems  Constitutional:  Positive for fatigue. Negative for chills, diaphoresis, fever and unexpected weight change.  HENT: Negative.  Negative for sore throat and trouble swallowing.   Eyes: Negative.   Respiratory:  Positive for cough and shortness of breath. Negative for chest tightness and wheezing.   Cardiovascular:  Negative for chest pain, palpitations and leg swelling.  Gastrointestinal:  Negative for abdominal pain, constipation, diarrhea, nausea and vomiting.  Endocrine: Negative.   Genitourinary: Negative.  Negative for difficulty urinating.  Musculoskeletal: Negative.   Skin: Negative.   Neurological:  Negative for dizziness, weakness, light-headedness and headaches.  Hematological:  Negative for adenopathy. Does not bruise/bleed easily.   Psychiatric/Behavioral: Negative.      Objective:  BP 116/72 (BP Location: Left Arm, Patient Position: Sitting, Cuff Size: Large)   Pulse 73   Temp 98.2 F (36.8 C) (Oral)   Ht $R'5\' 8"'QD$  (1.727 m)   SpO2 98%   BMI 32.30 kg/m   BP Readings from Last 3 Encounters:  12/31/21 116/72  11/07/21 114/74  10/28/21 118/76    Wt Readings from Last 3 Encounters:  11/07/21 212 lb 6.4 oz (96.3 kg)  09/20/21 226 lb 11.2 oz (102.8 kg)  07/30/21 220 lb 4.8 oz (99.9 kg)    Physical Exam Constitutional:      General: He is not in acute distress.    Appearance: He is ill-appearing. He is not toxic-appearing or diaphoretic.  HENT:     Mouth/Throat:     Mouth: Mucous membranes are moist.  Eyes:     General: No scleral icterus.    Conjunctiva/sclera: Conjunctivae normal.  Cardiovascular:     Rate and Rhythm: Normal rate and regular rhythm.     Heart sounds: Normal heart sounds, S1 normal and S2 normal.     No friction rub. No gallop.  Pulmonary:     Effort: Pulmonary effort is normal.     Breath sounds: Normal breath sounds. No decreased breath sounds, wheezing, rhonchi or rales.  Abdominal:     General: Abdomen is flat.     Palpations: There is no mass.     Tenderness: There is no abdominal tenderness. There is no guarding.     Hernia: No hernia is present.  Musculoskeletal:     Right lower leg: No edema.     Left lower leg: No edema.  Skin:    General: Skin is warm and dry.  Neurological:     General: No focal deficit present.     Mental Status: Mental status is at baseline.  Psychiatric:        Mood and Affect: Mood normal.        Behavior: Behavior normal.     Lab Results  Component Value Date   WBC 10.3 12/31/2021   HGB 12.3 (L) 12/31/2021   HCT 36.8 (L) 12/31/2021   PLT 262.0 12/31/2021   GLUCOSE 111 (H) 11/07/2021   CHOL 173 10/28/2021   TRIG 167.0 (H) 10/28/2021   HDL 38.10 (L) 10/28/2021   LDLCALC 101 (H) 10/28/2021   ALT 15 11/07/2021   AST 11 (L) 11/07/2021    NA 136 11/07/2021   K 4.5 11/07/2021   CL 103 11/07/2021   CREATININE 0.94 11/07/2021   BUN 25 (H) 11/07/2021   CO2 27 11/07/2021   TSH 0.86 09/30/2020   PSA 0.7 07/30/2021   INR 1.1 09/17/2021   HGBA1C 5.7 10/28/2021    CT Head  Wo Contrast  Result Date: 09/16/2021 CLINICAL DATA:  Provided history: Brain metastases suspected. Additional history provided by scanning technologist: Patient's spouse reports patient aspirated last night while sleeping, confusion this morning. EXAM: CT HEAD WITHOUT CONTRAST TECHNIQUE: Contiguous axial images were obtained from the base of the skull through the vertex without intravenous contrast. RADIATION DOSE REDUCTION: This exam was performed according to the departmental dose-optimization program which includes automated exposure control, adjustment of the mA and/or kV according to patient size and/or use of iterative reconstruction technique. COMPARISON:  Head CT 08/15/2019.  Brain MRI 12/27/2018. FINDINGS: Brain: Mild generalized cerebral and cerebellar atrophy. Minimal chronic small vessel ischemic changes within the cerebral white matter, better appreciated on the brain MRI of 12/27/2018. There is no acute intracranial hemorrhage. No demarcated cortical infarct. No extra-axial fluid collection. No evidence of an intracranial mass. No midline shift. Vascular: No hyperdense vessel. Atherosclerotic calcifications. Skull: Normal. Negative for fracture or focal lesion. Sinuses/Orbits: Visualized orbits show no acute finding. No significant paranasal sinus disease at the imaged levels. IMPRESSION: No evidence of acute intracranial abnormality. No appreciable intracranial metastatic disease on this non-contrast head CT. However, please note a brain MRI with contrast would have greater sensitivity. Minimal chronic small vessel ischemic changes within the cerebral white matter. Mild generalized cerebral and cerebellar atrophy. Electronically Signed   By: Kellie Simmering D.O.    On: 09/16/2021 14:41   CT Head W or Wo Contrast  Result Date: 09/16/2021 CLINICAL DATA:  Brain metastasis suspected.  Altered mental status. EXAM: CT HEAD WITHOUT AND WITH CONTRAST TECHNIQUE: Contiguous axial images were obtained from the base of the skull through the vertex without and with intravenous contrast. RADIATION DOSE REDUCTION: This exam was performed according to the departmental dose-optimization program which includes automated exposure control, adjustment of the mA and/or kV according to patient size and/or use of iterative reconstruction technique. CONTRAST:  127mL OMNIPAQUE IOHEXOL 300 MG/ML  SOLN COMPARISON:  Unenhanced head CT earlier today approximately 2-1/2 hours ago. FINDINGS: Postcontrast imaging of the brain.  Mild motion degradation. Normal enhancement of the brain.  No evidence of metastatic disease. Normal vascular enhancement. Normal arterial and venous enhancement. Ventricle size normal.  No midline shift. IMPRESSION: Normal enhancement of the brain by CT. Negative for metastatic disease and no acute abnormality. Electronically Signed   By: Franchot Gallo M.D.   On: 09/16/2021 17:03   CT ABDOMEN PELVIS W CONTRAST  Result Date: 09/16/2021 CLINICAL DATA:  Abdominal pain, acutem, nonlocalized. History of prostate cancer. EXAM: CT ABDOMEN AND PELVIS WITH CONTRAST TECHNIQUE: Multidetector CT imaging of the abdomen and pelvis was performed using the standard protocol following bolus administration of intravenous contrast. RADIATION DOSE REDUCTION: This exam was performed according to the departmental dose-optimization program which includes automated exposure control, adjustment of the mA and/or kV according to patient size and/or use of iterative reconstruction technique. CONTRAST:  143mL OMNIPAQUE IOHEXOL 300 MG/ML  SOLN COMPARISON:  12/04/2020 FINDINGS: Lower chest: Patchy parenchymal densities in the right upper lobe, right middle lobe and right lower lobe. Findings are  compatible with pneumonia. No pleural effusions. Bandlike density in the lingula could represent atelectasis. Hepatobiliary: Gallbladder is distended without surrounding inflammatory changes. Normal appearance of the liver without a discrete lesion. No biliary dilatation. Main portal venous system is patent. Pancreas: Unremarkable. No pancreatic ductal dilatation or surrounding inflammatory changes. Spleen: Normal in size without focal abnormality. Adrenals/Urinary Tract: Normal adrenal glands. Artifact from the patient's arms limits evaluation of the right kidney. Again noted  are right renal cortical cysts. Prominent left renal sinus cyst. No suspicious renal lesions and no hydronephrosis. Normal appearance of the urinary bladder. Stomach/Bowel: Cecum is located in the lower anterior abdomen at the level of the umbilicus. No evidence for bowel distension or focal bowel inflammation. Normal appearance of the stomach. Vascular/Lymphatic: Atherosclerotic calcifications in the abdominal aorta without an aneurysm. No lymph node enlargement in the abdomen or pelvis. Reproductive: Stable small prostate. Other: Negative for free fluid. Negative for free air. Question a tiny umbilical hernia containing fat. Musculoskeletal: Again noted are multiple vertebral body compression fractures. There is vertebral body cement at T10, T12, L1 and L2. Chronic vertebral body compression fractures at T8 and T9. IMPRESSION: 1. Right lung pneumonia. Patchy disease in the right upper lobe, right middle lobe and right lower lobe. 2. No acute abnormality in the abdomen or pelvis. 3. Gallbladder distension without inflammatory changes. 4. Bilateral renal cysts. Limited evaluation of the kidneys due to streak artifact. 5. Multiple vertebral body compression fractures. Previous vertebral body augmentation procedures. Electronically Signed   By: Markus Daft M.D.   On: 09/16/2021 17:12   DG Chest Port 1 View  Result Date: 09/16/2021 CLINICAL  DATA:  Possible aspiration.  Confusion. EXAM: PORTABLE CHEST 1 VIEW COMPARISON:  08/15/2019 FINDINGS: Numerous leads and wires project over the chest. Midline trachea. Normal heart size for level of inspiration. Mild right hemidiaphragm elevation. Apical lordotic positioning. Similar appearance of relatively linear/wedge-shaped areas of bibasilar and left midlung airspace disease. IMPRESSION: Limited AP portable radiograph with low lung volumes and overlying wires and leads. Similar appearance of bilateral pulmonary opacities, favoring scarring. No new lobar consolidation. Electronically Signed   By: Abigail Miyamoto M.D.   On: 09/16/2021 11:22   DG Chest 2 View  Result Date: 12/31/2021 CLINICAL DATA:  Cough, chest pain and shortness of breath. EXAM: CHEST - 2 VIEW COMPARISON:  09/23/2021 FINDINGS: The heart size is normal. Stable tortuosity of the thoracic aorta. Stable emphysematous lung disease and bilateral parenchymal scarring. There is no evidence of pulmonary edema, consolidation, pneumothorax, nodule or pleural fluid. Stable evidence of prior vertebral augmentation at T10 and T12. IMPRESSION: Stable emphysema and pulmonary scarring. Electronically Signed   By: Aletta Edouard M.D.   On: 12/31/2021 15:06     Assessment & Plan:   Diagnoses and all orders for this visit:  Subacute cough- His chest x-ray is negative for mass or infiltrate. -     DG Chest 2 View; Future  Anemia of chronic disease- His H&H are stable. -     CBC with Differential/Platelet; Future -     CBC with Differential/Platelet  DOE (dyspnea on exertion)- Labs are reassuring. -     CBC with Differential/Platelet; Future -     Troponin I (High Sensitivity); Future -     Brain natriuretic peptide; Future -     Brain natriuretic peptide -     Troponin I (High Sensitivity) -     CBC with Differential/Platelet  Mild intermittent asthma without complication -     albuterol (VENTOLIN HFA) 108 (90 Base) MCG/ACT inhaler; Inhale 2  puffs into the lungs every 6 (six) hours as needed for wheezing or shortness of breath. -     Fluticasone-Umeclidin-Vilant (TRELEGY ELLIPTA) 100-62.5-25 MCG/ACT AEPB; INHALE 1 PUFF INTO LUNGS DAILY  Essential hypertension- His blood pressure is well controlled. -     olmesartan (BENICAR) 40 MG tablet; Take 1 tablet (40 mg total) by mouth daily.   I have  changed Linward [place]. [place]'s Trelegy Ellipta to Fluticasone-Umeclidin-Vilant. I have also changed his albuterol. I am also having him maintain his CALCIUM-VITAMIN D PO, Leuprolide Acetate (LUPRON IJ), Narcan, acetaminophen, Multiple Vitamins-Minerals (CENTRUM SILVER 50+MEN PO), solifenacin, sodium chloride, fluticasone, denosumab, carvedilol, polyethylene glycol powder, vitamin C, vitamin E, Vista Advanced Carotenoid, esomeprazole, furosemide, abiraterone acetate, Oxycodone HCl, predniSONE, albuterol, azelastine, and olmesartan.  Meds ordered this encounter  Medications   albuterol (VENTOLIN HFA) 108 (90 Base) MCG/ACT inhaler    Sig: Inhale 2 puffs into the lungs every 6 (six) hours as needed for wheezing or shortness of breath.    Dispense:  18 g    Refill:  3   Fluticasone-Umeclidin-Vilant (TRELEGY ELLIPTA) 100-62.5-25 MCG/ACT AEPB    Sig: INHALE 1 PUFF INTO LUNGS DAILY    Dispense:  120 each    Refill:  1   olmesartan (BENICAR) 40 MG tablet    Sig: Take 1 tablet (40 mg total) by mouth daily.    Dispense:  90 tablet    Refill:  1     Follow-up: Return in about 4 weeks (around 01/28/2022).  Scarlette Calico, MD

## 2022-01-02 ENCOUNTER — Other Ambulatory Visit: Payer: Self-pay | Admitting: Internal Medicine

## 2022-01-02 DIAGNOSIS — I10 Essential (primary) hypertension: Secondary | ICD-10-CM

## 2022-01-02 DIAGNOSIS — J4520 Mild intermittent asthma, uncomplicated: Secondary | ICD-10-CM

## 2022-01-02 MED ORDER — ALBUTEROL SULFATE HFA 108 (90 BASE) MCG/ACT IN AERS: 2.0000 | INHALATION_SPRAY | Freq: Four times a day (QID) | RESPIRATORY_TRACT | 3 refills | Status: DC | PRN

## 2022-01-02 MED ORDER — FLUTICASONE-UMECLIDIN-VILANT 100-62.5-25 MCG/ACT IN AEPB: INHALATION_SPRAY | RESPIRATORY_TRACT | 1 refills | Status: DC

## 2022-01-02 MED ORDER — OLMESARTAN MEDOXOMIL 40 MG PO TABS: 40.0000 mg | ORAL_TABLET | Freq: Every day | ORAL | 1 refills | Status: DC

## 2022-01-05 ENCOUNTER — Other Ambulatory Visit: Payer: Self-pay | Admitting: *Deleted

## 2022-01-05 DIAGNOSIS — C7951 Secondary malignant neoplasm of bone: Secondary | ICD-10-CM

## 2022-01-06 ENCOUNTER — Other Ambulatory Visit: Payer: Self-pay | Admitting: Internal Medicine

## 2022-01-06 ENCOUNTER — Encounter: Payer: Self-pay | Admitting: Oncology

## 2022-01-06 ENCOUNTER — Telehealth: Payer: Self-pay

## 2022-01-06 ENCOUNTER — Other Ambulatory Visit: Payer: Self-pay | Admitting: Oncology

## 2022-01-06 DIAGNOSIS — J301 Allergic rhinitis due to pollen: Secondary | ICD-10-CM

## 2022-01-06 DIAGNOSIS — C7951 Secondary malignant neoplasm of bone: Secondary | ICD-10-CM

## 2022-01-06 MED ORDER — OXYCODONE HCL 5 MG PO TABS: 5.0000 mg | ORAL_TABLET | ORAL | 0 refills | Status: DC | PRN

## 2022-01-06 NOTE — Telephone Encounter (Signed)
Returned patient's call regarding his need for further refill on Oxycodone. Dr. Alen Blew was informed of this and he sent in another refill. Patient notified of this.

## 2022-01-12 ENCOUNTER — Telehealth: Payer: Self-pay | Admitting: Oncology

## 2022-01-12 NOTE — Telephone Encounter (Signed)
Called patient regarding upcoming July appointment, patient is notified. 

## 2022-01-23 DIAGNOSIS — R351 Nocturia: Secondary | ICD-10-CM | POA: Diagnosis not present

## 2022-01-23 DIAGNOSIS — C61 Malignant neoplasm of prostate: Secondary | ICD-10-CM | POA: Diagnosis not present

## 2022-01-23 DIAGNOSIS — Z8551 Personal history of malignant neoplasm of bladder: Secondary | ICD-10-CM | POA: Diagnosis not present

## 2022-01-23 DIAGNOSIS — C7951 Secondary malignant neoplasm of bone: Secondary | ICD-10-CM | POA: Diagnosis not present

## 2022-02-04 ENCOUNTER — Other Ambulatory Visit: Payer: Self-pay | Admitting: *Deleted

## 2022-02-04 DIAGNOSIS — C7951 Secondary malignant neoplasm of bone: Secondary | ICD-10-CM

## 2022-02-04 MED ORDER — OXYCODONE HCL 5 MG PO TABS: 5.0000 mg | ORAL_TABLET | ORAL | 0 refills | Status: DC | PRN

## 2022-02-09 ENCOUNTER — Other Ambulatory Visit: Payer: Self-pay | Admitting: Internal Medicine

## 2022-02-09 DIAGNOSIS — J4521 Mild intermittent asthma with (acute) exacerbation: Secondary | ICD-10-CM

## 2022-02-10 ENCOUNTER — Other Ambulatory Visit: Payer: Self-pay

## 2022-02-10 ENCOUNTER — Inpatient Hospital Stay: Payer: Medicare Other | Attending: Oncology

## 2022-02-10 ENCOUNTER — Inpatient Hospital Stay: Payer: Medicare Other

## 2022-02-10 ENCOUNTER — Inpatient Hospital Stay: Payer: Medicare Other | Admitting: Oncology

## 2022-02-10 DIAGNOSIS — C7951 Secondary malignant neoplasm of bone: Secondary | ICD-10-CM

## 2022-02-10 DIAGNOSIS — Z79899 Other long term (current) drug therapy: Secondary | ICD-10-CM | POA: Insufficient documentation

## 2022-02-10 DIAGNOSIS — C7952 Secondary malignant neoplasm of bone marrow: Secondary | ICD-10-CM | POA: Diagnosis not present

## 2022-02-10 DIAGNOSIS — C61 Malignant neoplasm of prostate: Secondary | ICD-10-CM | POA: Insufficient documentation

## 2022-02-10 DIAGNOSIS — Z5111 Encounter for antineoplastic chemotherapy: Secondary | ICD-10-CM | POA: Insufficient documentation

## 2022-02-10 DIAGNOSIS — Z8546 Personal history of malignant neoplasm of prostate: Secondary | ICD-10-CM

## 2022-02-10 LAB — CMP (CANCER CENTER ONLY)
ALT: 15 U/L (ref 0–44)
AST: 12 U/L — ABNORMAL LOW (ref 15–41)
Albumin: 4 g/dL (ref 3.5–5.0)
Alkaline Phosphatase: 48 U/L (ref 38–126)
Anion gap: 8 (ref 5–15)
BUN: 18 mg/dL (ref 8–23)
CO2: 26 mmol/L (ref 22–32)
Calcium: 9.3 mg/dL (ref 8.9–10.3)
Chloride: 102 mmol/L (ref 98–111)
Creatinine: 0.91 mg/dL (ref 0.61–1.24)
GFR, Estimated: >60 mL/min (ref >60)
Glucose, Bld: 116 mg/dL — ABNORMAL HIGH (ref 70–99)
Potassium: 4.1 mmol/L (ref 3.5–5.1)
Sodium: 136 mmol/L (ref 135–145)
Total Bilirubin: 0.3 mg/dL (ref 0.3–1.2)
Total Protein: 6.6 g/dL (ref 6.5–8.1)

## 2022-02-10 LAB — CBC WITH DIFFERENTIAL (CANCER CENTER ONLY)
Abs Immature Granulocytes: 0.07 10*3/uL (ref 0.00–0.07)
Basophils Absolute: 0 10*3/uL (ref 0.0–0.1)
Basophils Relative: 0 %
Eosinophils Absolute: 0.1 10*3/uL (ref 0.0–0.5)
Eosinophils Relative: 1 %
HCT: 37.5 % — ABNORMAL LOW (ref 39.0–52.0)
Hemoglobin: 12.6 g/dL — ABNORMAL LOW (ref 13.0–17.0)
Immature Granulocytes: 1 %
Lymphocytes Relative: 18 %
Lymphs Abs: 2 10*3/uL (ref 0.7–4.0)
MCH: 29.6 pg (ref 26.0–34.0)
MCHC: 33.6 g/dL (ref 30.0–36.0)
MCV: 88.2 fL (ref 80.0–100.0)
Monocytes Absolute: 0.9 10*3/uL (ref 0.1–1.0)
Monocytes Relative: 8 %
Neutro Abs: 8.4 10*3/uL — ABNORMAL HIGH (ref 1.7–7.7)
Neutrophils Relative %: 72 %
Platelet Count: 275 10*3/uL (ref 150–400)
RBC: 4.25 MIL/uL (ref 4.22–5.81)
RDW: 13.8 % (ref 11.5–15.5)
WBC Count: 11.6 10*3/uL — ABNORMAL HIGH (ref 4.0–10.5)
nRBC: 0 % (ref 0.0–0.2)

## 2022-02-10 MED ORDER — SODIUM CHLORIDE 0.9 % IV SOLN: Freq: Once | INTRAVENOUS | Status: DC

## 2022-02-10 MED ORDER — DENOSUMAB 60 MG/ML ~~LOC~~ SOSY
60.0000 mg | PREFILLED_SYRINGE | Freq: Once | SUBCUTANEOUS | Status: AC
  Administered 2022-02-10: 60 mg via SUBCUTANEOUS
  Filled 2022-02-10: qty 1

## 2022-02-10 MED ORDER — LEUPROLIDE ACETATE (3 MONTH) 22.5 MG ~~LOC~~ KIT
22.5000 mg | PACK | Freq: Once | SUBCUTANEOUS | Status: AC
  Administered 2022-02-10: 22.5 mg via SUBCUTANEOUS
  Filled 2022-02-10: qty 22.5

## 2022-02-10 MED ORDER — OXYCODONE HCL 5 MG PO TABS: 5.0000 mg | ORAL_TABLET | ORAL | 0 refills | Status: DC | PRN

## 2022-02-10 NOTE — Progress Notes (Signed)
Hematology and Oncology Follow Up Visit  JOELLE FLESSNER [id number] 1955-08-30 66 y.o. 02/10/2022 12:59 PM [name], MDJones, [name], MD   Principle Diagnosis: 66 year old man with advanced prostate cancer with lymphadenopathy diagnosed in 2018.  He has castration-resistant after presenting in 2014 with localized disease.  Prior Therapy:  Androgen deprivation therapy in 2014.  He developed castration resistant in 2018.  He is status post T10, T12, kyphoplasty completed on January 22 of 2020.  Current therapy:  Zytiga 1000 mg daily with prednisone at 5 mg daily started in 2018.  Eligard 22.5 mg every 3 months.    Interim History: Mr. Altadonna returns today for a follow-up.  Since last visit, he reports feeling well without any complaints.  He continues to tolerate Zytiga without any issues.  He denies any nausea vomiting or abdominal pain.  His bone pain is manageable with oxycodone.  He denies any recent hospitalizations or illnesses.         Medications: Updated on review. Current Outpatient Medications  Medication Sig Dispense Refill   abiraterone acetate (ZYTIGA) 250 MG tablet Take 4 tablets (1,000 mg total) by mouth daily. 120 tablet 10   acetaminophen (TYLENOL) 325 MG tablet Take 325 mg by mouth daily as needed for fever.     albuterol (PROVENTIL) (2.5 MG/3ML) 0.083% nebulizer solution NEBULIZE 1 VIAL EVERY 6-8 HOURS AS NEEDED FOR WHEEZING OR SHORTNESS OF BREATH 360 mL 1   albuterol (VENTOLIN HFA) 108 (90 Base) MCG/ACT inhaler Inhale 2 puffs into the lungs every 6 (six) hours as needed for wheezing or shortness of breath. 18 g 3   azelastine (ASTELIN) 0.1 % nasal spray Place 2 puffs into the nose in the morning and at bedtime. 90 mL 1   CALCIUM-VITAMIN D PO Take 1 tablet by mouth 2 (two) times daily.      carvedilol (COREG) 3.125 MG tablet TAKE (1) TABLET TWICE A DAY WITH MEALS (BREAKFAST AND SUPPER) 180 tablet 1   denosumab (PROLIA) 60 MG/ML SOSY injection Inject  60 mg into the skin every 6 (six) months.     esomeprazole (NEXIUM) 20 MG capsule Take 20 mg by mouth daily at 12 noon.     fluticasone (FLONASE) 50 MCG/ACT nasal spray Place 2 sprays into both nostrils daily. 48 g 1   Fluticasone-Umeclidin-Vilant (TRELEGY ELLIPTA) 100-62.5-25 MCG/ACT AEPB INHALE 1 PUFF INTO LUNGS DAILY 120 each 1   furosemide (LASIX) 20 MG tablet Take 1 tablet (20 mg total) by mouth 2 (two) times daily. 180 tablet 1   Leuprolide Acetate (LUPRON IJ) Inject 1 Dose as directed every 4 (four) months. This is administered by [facility]     Multiple Vitamins-Minerals (CENTRUM SILVER 50+MEN PO) Take 1 tablet by mouth daily.     NARCAN 4 MG/0.1ML LIQD nasal spray kit Place 0.4 mg into the nose once as needed (overdose).      olmesartan (BENICAR) 40 MG tablet Take 1 tablet (40 mg total) by mouth daily. 90 tablet 1   oxyCODONE (OXY IR/ROXICODONE) 5 MG immediate release tablet Take 1 tablet (5 mg total) by mouth every 4 (four) hours as needed for severe pain. 90 tablet 0   polyethylene glycol powder (GLYCOLAX/MIRALAX) 17 GM/SCOOP powder Take 17 g (1 capful) by mouth daily as directed. 510 g 6   predniSONE (DELTASONE) 5 MG tablet TAKE 1 TABLET 2 TIMES A DAY 90 tablet 3   sodium chloride 1 g tablet Take 1 g by mouth 2 (two) times daily with  a meal.     solifenacin (VESICARE) 5 MG tablet Take 5 mg by mouth daily.     Specialty Vitamins Products (VISTA ADVANCED CAROTENOID) CAPS Take 1 capsule by mouth every evening.     vitamin C (ASCORBIC ACID) 500 MG tablet Take 500 mg by mouth every evening.     vitamin E 180 MG (400 UNITS) capsule Take 400 Units by mouth every evening.     No current facility-administered medications for this visit.     Allergies:  Allergies  Allergen Reactions   Amlodipine Swelling    edema   Tetracycline Rash      Physical Exam:        Blood pressure 109/72, pulse 88, temperature (!) 97.2 F (36.2 C), temperature source Temporal, resp. rate 16,  weight 216 lb 14.4 oz (98.4 kg), SpO2 99 %.    ECOG: 1   General appearance: Alert, awake without any distress. Head: Atraumatic without abnormalities Oropharynx: Without any thrush or ulcers. Eyes: No scleral icterus. Lymph nodes: No lymphadenopathy noted in the cervical, supraclavicular, or axillary nodes Heart:regular rate and rhythm, without any murmurs or gallops.   Lung: Clear to auscultation without any rhonchi, wheezes or dullness to percussion. Abdomin: Soft, nontender without any shifting dullness or ascites. Musculoskeletal: No clubbing or cyanosis. Neurological: No motor or sensory deficits. Skin: No rashes or lesions.            Lab Results: Lab Results  Component Value Date   WBC 10.3 12/31/2021   HGB 12.3 (L) 12/31/2021   HCT 36.8 (L) 12/31/2021   MCV 87.2 12/31/2021   PLT 262.0 12/31/2021   PSA 0.7 07/30/2021     Chemistry      Component Value Date/Time   NA 136 11/07/2021 1326   K 4.5 11/07/2021 1326   CL 103 11/07/2021 1326   CO2 27 11/07/2021 1326   BUN 25 (H) 11/07/2021 1326   CREATININE 0.94 11/07/2021 1326      Component Value Date/Time   CALCIUM 9.8 11/07/2021 1326   ALKPHOS 50 11/07/2021 1326   AST 11 (L) 11/07/2021 1326   ALT 15 11/07/2021 1326   BILITOT 0.4 11/07/2021 1326        Latest Reference Range & Units 03/12/21 10:06 07/30/21 14:46 11/07/21 13:26  Prostate Specific Ag, Serum 0.0 - 4.0 ng/mL 0.4 0.7 0.7        Impression and Plan:  66 year old man with:   1.    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    His disease status was updated at this time and treatment choices were reviewed.  His PSA continues to be relatively stable despite a slow increase.  Alternative treatment options including Taxotere chemotherapy others were reiterated which will be deferred at this time.  He is agreeable to continue at this time.   2.  Compression fracture  tween T10 and T12: No worsening pain or symptoms noted at this time.  Appear to be stable.    3.  Bone directed therapy: Risks and benefits of proceeding with Prolia today were discussed.  Osteonecrosis of the jaw and hypocalcemia were discussed.  He is agreeable to proceed.   4.  Pain: He remains on oxycodone which will be continued at this time.  No modification is needed of his dosing schedule.   5.  Prognosis and goals of care: Therapy remains palliative although aggressive measures are warranted given his reasonable performance status.  6.  Follow-up: In 3 months for a follow-up.   30  minutes were spent on this visit.  The time was dedicated to reviewing laboratory data, disease status update and outlining future plan of care discussion.         Zola Button, MD 7/18/202312:59 PM

## 2022-02-11 ENCOUNTER — Telehealth: Payer: Self-pay | Admitting: *Deleted

## 2022-02-11 LAB — PROSTATE-SPECIFIC AG, SERUM (LABCORP): Prostate Specific Ag, Serum: 0.8 ng/mL (ref 0.0–4.0)

## 2022-02-11 NOTE — Telephone Encounter (Signed)
-----   Message from Wyatt Portela, MD sent at 02/11/2022  8:54 AM EDT ----- Please let him know his PSA changed very little

## 2022-02-11 NOTE — Telephone Encounter (Signed)
PC to patient, informed him his PSA is 0.8, he verbalizes understanding.

## 2022-02-12 ENCOUNTER — Telehealth: Payer: Self-pay

## 2022-02-12 ENCOUNTER — Ambulatory Visit (INDEPENDENT_AMBULATORY_CARE_PROVIDER_SITE_OTHER): Payer: Medicare Other

## 2022-02-12 DIAGNOSIS — Z0000 Encounter for general adult medical examination without abnormal findings: Secondary | ICD-10-CM | POA: Diagnosis not present

## 2022-02-12 NOTE — Telephone Encounter (Signed)
RN reached out to patient for consideration of genetic testing due to metastatic prostate cancer.   Pt declined at this time; additional question asked if it would benefit for a research project he would be willing.  Information forward to genetic counselor.

## 2022-02-12 NOTE — Patient Instructions (Signed)
[name] Holder , Thank you for taking time to come for your Medicare Wellness Visit. I appreciate your ongoing commitment to your health goals. Please review the following plan we discussed and let me know if I can assist you in the future.   Screening recommendations/referrals: Colonoscopy: 08/07/2021; due every year Recommended yearly ophthalmology/optometry visit for glaucoma screening and checkup Recommended yearly dental visit for hygiene and checkup  Vaccinations: Influenza vaccine: 04/18/2021 Pneumococcal vaccine: 08/29/2015, 04/27/2019 Tdap vaccine: 08/26/2012; due every 10 years Shingles vaccine: 06/17/2020, 11/20/2020   Covid-19: 09/01/2019, 09/22/2019, 04/23/2020, 11/08/2020, 05/13/2021  Advanced directives: Yes; Please bring a copy of your health care power of attorney and living will to the office at your convenience.  Conditions/risks identified: Yes  Next appointment: Please schedule your next Medicare Wellness Visit with your Nurse Health Advisor in 1 year by calling [phone].  Preventive Care 63 Years and Older, Male Preventive care refers to lifestyle choices and visits with your health care provider that can promote health and wellness. What does preventive care include? A yearly physical exam. This is also called an annual well check. Dental exams once or twice a year. Routine eye exams. Ask your health care provider how often you should have your eyes checked. Personal lifestyle choices, including: Daily care of your teeth and gums. Regular physical activity. Eating a healthy diet. Avoiding tobacco and drug use. Limiting alcohol use. Practicing safe sex. Taking low doses of aspirin every day. Taking vitamin and mineral supplements as recommended by your health care provider. What happens during an annual well check? The services and screenings done by your health care provider during your annual well check will depend on your age, overall health, lifestyle risk factors,  and family history of disease. Counseling  Your health care provider may ask you questions about your: Alcohol use. Tobacco use. Drug use. Emotional well-being. Home and relationship well-being. Sexual activity. Eating habits. History of falls. Memory and ability to understand (cognition). Work and work Statistician. Screening  You may have the following tests or measurements: Height, weight, and BMI. Blood pressure. Lipid and cholesterol levels. These may be checked every 5 years, or more frequently if you are over 41 years old. Skin check. Lung cancer screening. You may have this screening every year starting at age 87 if you have a 30-pack-year history of smoking and currently smoke or have quit within the past 15 years. Fecal occult blood test (FOBT) of the stool. You may have this test every year starting at age 25. Flexible sigmoidoscopy or colonoscopy. You may have a sigmoidoscopy every 5 years or a colonoscopy every 10 years starting at age 51. Prostate cancer screening. Recommendations will vary depending on your family history and other risks. Hepatitis C blood test. Hepatitis B blood test. Sexually transmitted disease (STD) testing. Diabetes screening. This is done by checking your blood sugar (glucose) after you have not eaten for a while (fasting). You may have this done every 1-3 years. Abdominal aortic aneurysm (AAA) screening. You may need this if you are a current or former smoker. Osteoporosis. You may be screened starting at age 74 if you are at high risk. Talk with your health care provider about your test results, treatment options, and if necessary, the need for more tests. Vaccines  Your health care provider may recommend certain vaccines, such as: Influenza vaccine. This is recommended every year. Tetanus, diphtheria, and acellular pertussis (Tdap, Td) vaccine. You may need a Td booster every 10 years. Zoster vaccine. You may  need this after age  41. Pneumococcal 13-valent conjugate (PCV13) vaccine. One dose is recommended after age 56. Pneumococcal polysaccharide (PPSV23) vaccine. One dose is recommended after age 6. Talk to your health care provider about which screenings and vaccines you need and how often you need them. This information is not intended to replace advice given to you by your health care provider. Make sure you discuss any questions you have with your health care provider. Document Released: 08/09/2015 Document Revised: 04/01/2016 Document Reviewed: 05/14/2015 Elsevier Interactive Patient Education  2017 [place] Prevention in the Home Falls can cause injuries. They can happen to people of all ages. There are many things you can do to make your home safe and to help prevent falls. What can I do on the outside of my home? Regularly fix the edges of walkways and driveways and fix any cracks. Remove anything that might make you trip as you walk through a door, such as a raised step or threshold. Trim any bushes or trees on the path to your home. Use bright outdoor lighting. Clear any walking paths of anything that might make someone trip, such as rocks or tools. Regularly check to see if handrails are loose or broken. Make sure that both sides of any steps have handrails. Any raised decks and porches should have guardrails on the edges. Have any leaves, snow, or ice cleared regularly. Use sand or salt on walking paths during winter. Clean up any spills in your garage right away. This includes oil or grease spills. What can I do in the bathroom? Use night lights. Install grab bars by the toilet and in the tub and shower. Do not use towel bars as grab bars. Use non-skid mats or decals in the tub or shower. If you need to sit down in the shower, use a plastic, non-slip stool. Keep the floor dry. Clean up any water that spills on the floor as soon as it happens. Remove soap buildup in the tub or shower  regularly. Attach bath mats securely with double-sided non-slip rug tape. Do not have throw rugs and other things on the floor that can make you trip. What can I do in the bedroom? Use night lights. Make sure that you have a light by your bed that is easy to reach. Do not use any sheets or blankets that are too big for your bed. They should not hang down onto the floor. Have a firm chair that has side arms. You can use this for support while you get dressed. Do not have throw rugs and other things on the floor that can make you trip. What can I do in the kitchen? Clean up any spills right away. Avoid walking on wet floors. Keep items that you use a lot in easy-to-reach places. If you need to reach something above you, use a strong step stool that has a grab bar. Keep electrical cords out of the way. Do not use floor polish or wax that makes floors slippery. If you must use wax, use non-skid floor wax. Do not have throw rugs and other things on the floor that can make you trip. What can I do with my stairs? Do not leave any items on the stairs. Make sure that there are handrails on both sides of the stairs and use them. Fix handrails that are broken or loose. Make sure that handrails are as long as the stairways. Check any carpeting to make sure that it is firmly attached  to the stairs. Fix any carpet that is loose or worn. Avoid having throw rugs at the top or bottom of the stairs. If you do have throw rugs, attach them to the floor with carpet tape. Make sure that you have a light switch at the top of the stairs and the bottom of the stairs. If you do not have them, ask someone to add them for you. What else can I do to help prevent falls? Wear shoes that: Do not have high heels. Have rubber bottoms. Are comfortable and fit you well. Are closed at the toe. Do not wear sandals. If you use a stepladder: Make sure that it is fully opened. Do not climb a closed stepladder. Make sure that  both sides of the stepladder are locked into place. Ask someone to hold it for you, if possible. Clearly mark and make sure that you can see: Any grab bars or handrails. First and last steps. Where the edge of each step is. Use tools that help you move around (mobility aids) if they are needed. These include: Canes. Walkers. Scooters. Crutches. Turn on the lights when you go into a dark area. Replace any light bulbs as soon as they burn out. Set up your furniture so you have a clear path. Avoid moving your furniture around. If any of your floors are uneven, fix them. If there are any pets around you, be aware of where they are. Review your medicines with your doctor. Some medicines can make you feel dizzy. This can increase your chance of falling. Ask your doctor what other things that you can do to help prevent falls. This information is not intended to replace advice given to you by your health care provider. Make sure you discuss any questions you have with your health care provider. Document Released: 05/09/2009 Document Revised: 12/19/2015 Document Reviewed: 08/17/2014 Elsevier Interactive Patient Education  2017 [employer].

## 2022-02-12 NOTE — Progress Notes (Signed)
I connected with [name] Holder today by telephone and verified that I am speaking with the correct person using two identifiers. Location patient: home Location provider: work Persons participating in the virtual visit: patient, provider.   I discussed the limitations, risks, security and privacy concerns of performing an evaluation and management service by telephone and the availability of in person appointments. I also discussed with the patient that there may be a patient responsible charge related to this service. The patient expressed understanding and verbally consented to this telephonic visit.    Interactive audio and video telecommunications were attempted between this provider and patient, however failed, due to patient having technical difficulties OR patient did not have access to video capability.  We continued and completed visit with audio only.  Some vital signs may be absent or patient reported.   Time Spent with patient on telephone encounter: 40 minutes  Subjective:   [name] Holder is a 66 y.o. male who presents for an Initial Medicare Annual Wellness Visit.  Review of Systems     Cardiac Risk Factors include: advanced age (>44men, >13 women);dyslipidemia;hypertension;male gender;obesity (BMI >30kg/m2);family history of premature cardiovascular disease     Objective:    There were no vitals filed for this visit. There is no height or weight on file to calculate BMI.     02/12/2022   10:17 AM 09/17/2021    1:00 AM 09/16/2021   11:52 AM 09/04/2020   10:17 AM 02/12/2020    9:18 AM 08/15/2019    3:42 PM 12/27/2018    2:32 PM  Advanced Directives  Does Patient Have a Medical Advance Directive? Yes Yes Yes Yes Yes No No  Type of Advance Directive Living will;Healthcare Power of Attorney Living will   [name]    Does patient want to make changes to medical advance directive? No - Patient declined  No - Patient declined No - Patient declined No -  Patient declined    Copy of [place] in Chart? No - copy requested        Would patient like information on creating a medical advance directive?      No - Patient declined Yes (ED - Information included in AVS)    Current Medications (verified) Outpatient Encounter Medications as of 02/12/2022  Medication Sig   abiraterone acetate (ZYTIGA) 250 MG tablet Take 4 tablets (1,000 mg total) by mouth daily.   acetaminophen (TYLENOL) 325 MG tablet Take 325 mg by mouth daily as needed for fever.   albuterol (PROVENTIL) (2.5 MG/3ML) 0.083% nebulizer solution NEBULIZE 1 VIAL EVERY 6-8 HOURS AS NEEDED FOR WHEEZING OR SHORTNESS OF BREATH   albuterol (VENTOLIN HFA) 108 (90 Base) MCG/ACT inhaler Inhale 2 puffs into the lungs every 6 (six) hours as needed for wheezing or shortness of breath.   azelastine (ASTELIN) 0.1 % nasal spray Place 2 puffs into the nose in the morning and at bedtime.   CALCIUM-VITAMIN D PO Take 1 tablet by mouth 2 (two) times daily.    carvedilol (COREG) 3.125 MG tablet TAKE (1) TABLET TWICE A DAY WITH MEALS (BREAKFAST AND SUPPER)   denosumab (PROLIA) 60 MG/ML SOSY injection Inject 60 mg into the skin every 6 (six) months.   esomeprazole (NEXIUM) 20 MG capsule Take 20 mg by mouth daily at 12 noon.   fluticasone (FLONASE) 50 MCG/ACT nasal spray Place 2 sprays into both nostrils daily.   Fluticasone-Umeclidin-Vilant (TRELEGY ELLIPTA) 100-62.5-25 MCG/ACT AEPB INHALE 1 PUFF INTO LUNGS DAILY  furosemide (LASIX) 20 MG tablet Take 1 tablet (20 mg total) by mouth 2 (two) times daily.   Leuprolide Acetate (LUPRON IJ) Inject 1 Dose as directed every 4 (four) months. This is administered by [facility]   Multiple Vitamins-Minerals (CENTRUM SILVER 50+MEN PO) Take 1 tablet by mouth daily.   NARCAN 4 MG/0.1ML LIQD nasal spray kit Place 0.4 mg into the nose once as needed (overdose).    olmesartan (BENICAR) 40 MG tablet Take 1 tablet (40 mg total) by mouth daily.   [START ON  02/18/2022] oxyCODONE (OXY IR/ROXICODONE) 5 MG immediate release tablet Take 1 tablet (5 mg total) by mouth every 4 (four) hours as needed for severe pain.   polyethylene glycol powder (GLYCOLAX/MIRALAX) 17 GM/SCOOP powder Take 17 g (1 capful) by mouth daily as directed.   predniSONE (DELTASONE) 5 MG tablet TAKE 1 TABLET 2 TIMES A DAY   sodium chloride 1 g tablet Take 1 g by mouth 2 (two) times daily with a meal.   solifenacin (VESICARE) 5 MG tablet Take 5 mg by mouth daily.   Specialty Vitamins Products (VISTA ADVANCED CAROTENOID) CAPS Take 1 capsule by mouth every evening.   vitamin C (ASCORBIC ACID) 500 MG tablet Take 500 mg by mouth every evening.   vitamin E 180 MG (400 UNITS) capsule Take 400 Units by mouth every evening.   No facility-administered encounter medications on file as of 02/12/2022.    Allergies (verified) Amlodipine and Tetracycline   History: Past Medical History:  Diagnosis Date   Allergic rhinitis    At risk for sleep apnea    STOP-BANG SCORE = 5    (routed to pt's pcp 09-08-2018)   Bladder cancer ([place]) dx'd 2020   Chronic constipation    Chronic low back pain    Chronic pain    DOE (dyspnea on exertion)    History of kidney stones    History of recent pneumonia 08/10/2018   acute bronchopneumonia;  follow-up cxr 08-28-2018 in epic   History of vertebral compression fracture    T10 and T12 s/p kyphoplasty   Hx of colonic polyps    Hyperplasia of prostate with lower urinary tract symptoms (LUTS)    Hypertension    Mild persistent asthma    followed by pcp   Numbness in both legs    secondary to a fall, per pt has had work-up and couldn't find anything   Osteopenia    Prostate cancer metastatic to pelvis [facility]) urologist-- dr borden/  oncologist -- dr Alen Blew   dx 2014 advanced prostate cancer with pelvid adenopathy,  Stage T2c, Gleason 7;  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   Wears glasses    Past Surgical History:  Procedure Laterality Date   COLONOSCOPY      IR KYPHO EA ADDL LEVEL THORACIC OR LUMBAR  08/17/2018   IR KYPHO EA ADDL LEVEL THORACIC OR LUMBAR  08/26/2018   IR KYPHO LUMBAR INC FX REDUCE BONE BX UNI/BIL CANNULATION INC/IMAGING  08/26/2018   IR KYPHO THORACIC WITH BONE BIOPSY  08/17/2018   IR RADIOLOGIST EVAL & MGMT  08/11/2018   IR RADIOLOGIST EVAL & MGMT  08/24/2018   LYMPHADENECTOMY Left 01/05/2013   Procedure: ROBOTIC LYMPHADENECTOMY;  Surgeon: Dutch Gray, MD;  Location: [facility];  Service: Urology;  Laterality: Left;   PROSTATE BIOPSY     11/12 positive biopsies   ROTATOR CUFF REPAIR Left 2012   TRANSURETHRAL RESECTION OF BLADDER TUMOR N/A 09/15/2018   Procedure: TRANSURETHRAL RESECTION OF BLADDER TUMOR (  TURBT)WITH CYSTOSCOPY/ POST OPERATIVE INSTILLATION OF GEMCITABINE;  Surgeon: Raynelle Bring, MD;  Location: [facility];  Service: Urology;  Laterality: N/A;  GENERAL ANESTHESIA WITH PARALYSIS   UMBILICAL HERNIA REPAIR  09-02-2001   dr t. price $[email or web address]    Family History  Problem Relation Age of Onset   Arthritis Mother    Diabetes Mother    Hypertension Mother    Hyperlipidemia Mother    Hypertension Father    Prostate cancer Father        radiation in mid 5s   Heart attack Father    Cancer Sister        precancerous colon polyps, 6 in colon removed   Stroke Maternal Grandfather    Other Neg Hx        hyponatremia   Social History   Socioeconomic History   Marital status: Married    Spouse name: Not on file   Number of children: Not on file   Years of education: 16   Highest education level: Not on file  Occupational History   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  Tobacco Use   Smoking status: Former    Packs/day: 1.00    Years: 25.00    Total pack years: 25.00    Types: Cigarettes    Quit date: 09/08/2012    Years since quitting: 9.4   Smokeless tobacco: Never  Vaping Use   Vaping Use: Former   Start date: 09/08/2012   Quit date: 07/08/2018   Devices: e-cigarette,  e-blu   Substance and Sexual Activity   Alcohol use: Not Currently    Alcohol/week: 2.0 standard drinks of alcohol    Types: 2 Glasses of wine per week   Drug use: Never   Sexual activity: Not on file  Other Topics Concern   Not on file  Social History Narrative   Regular exercise-yes   Caffeine Use-yes   Social Determinants of Health   Financial Resource Strain: Low Risk  (02/12/2022)   Overall Financial Resource Strain (CARDIA)    Difficulty of Paying Living Expenses: Not hard at all  Food Insecurity: No Food Insecurity (02/12/2022)   Hunger Vital Sign    Worried About Running Out of Food in the Last Year: Never true    [place] in the Last Year: Never true  Transportation Needs: No Transportation Needs (02/12/2022)   PRAPARE - Hydrologist (Medical): No    Lack of Transportation (Non-Medical): No  Physical Activity: Inactive (02/12/2022)   Exercise Vital Sign    Days of Exercise per Week: 0 days    Minutes of Exercise per Session: 0 min  Stress: No Stress Concern Present (02/12/2022)   [place]    Feeling of Stress : Not at all  Social Connections: [place] (02/12/2022)   Social Connection and Isolation Panel [NHANES]    Frequency of Communication with Friends and Family: More than three times a week    Frequency of Social Gatherings with Friends and Family: Three times a week    Attends Religious Services: More than 4 times per year    Active Member of Clubs or Organizations: Yes    Attends Archivist Meetings: More than 4 times per year    Marital Status: Married    Tobacco Counseling Counseling given: Not Answered   Clinical Intake:  Pre-visit preparation completed: Yes  Pain :  No/denies pain     BMI - recorded: 32.3 Nutritional Status: BMI > 30  Obese Nutritional Risks: None Diabetes: No  How often do you need to have someone help you when  you read instructions, pamphlets, or other written materials from your doctor or pharmacy?: 1 - Never What is the last grade level you completed in school?: HSG  Diabetic? no  Interpreter Needed?: No  Information entered by :: Lisette Abu, LPN.   Activities of Daily Living    02/12/2022   10:20 AM 09/17/2021    1:00 AM  In your present state of health, do you have any difficulty performing the following activities:  Hearing? 1 1  Vision? 0 0  Difficulty concentrating or making decisions? 0 0  Walking or climbing stairs? 1 0  Dressing or bathing? 1 0  Doing errands, shopping? 1 1  Preparing Food and eating ? N   Using the Toilet? N   In the past six months, have you accidently leaked urine? N   Do you have problems with loss of bowel control? N   Managing your Medications? N   Managing your Finances? N   Housekeeping or managing your Housekeeping? Y     Patient Care Team: [name], MD as PCP - General (Internal Medicine) Charlton Haws, [facility] as Pharmacist (Pharmacist) [place] Comment, [place] as Referring Physician (Optometry)  Indicate any recent Medical Services you may have received from other than [facility] providers in the past year (date may be approximate).     Assessment:   This is a routine wellness examination for [name] Holder.  Hearing/Vision screen Hearing Screening - Comments:: Patient has some hearing difficulty. No hearing aids. Vision Screening - Comments:: Patient does wear corrective lenses/contacts.  Eye exam done by: [place] issues and exercise activities discussed: Current Exercise Habits: The patient does not participate in regular exercise at present, Exercise limited by: respiratory conditions(s);Other - see comments (cancer)   Goals Addressed             This Visit's Progress    To join [place] at [employer] and use a Physiological scientist.        Depression Screen    02/12/2022    9:59 AM 10/28/2021    11:05 AM 09/30/2020    3:50 PM 04/18/2019    4:31 PM 09/02/2017    9:17 AM 12/21/2013   10:02 AM 08/26/2012    9:30 AM  PHQ 2/9 Scores  PHQ - 2 Score 0 0 0 2 1 0 0  PHQ- 9 Score    4 3      Fall Risk    02/12/2022    9:50 AM 10/28/2021   11:06 AM 09/30/2020    3:50 PM 04/18/2019    4:31 PM 12/21/2013   10:02 AM  Fall Risk   Falls in the past year? 0 0 0 0 No  Number falls in past yr: 0   0   Injury with Fall? 0   0   Risk for fall due to :    Impaired balance/gait;Impaired mobility   Follow up    Falls evaluation completed     FALL RISK PREVENTION PERTAINING TO THE HOME:  Any stairs in or around the home? No  If so, are there any without handrails? No  Home free of loose throw rugs in walkways, pet beds, electrical cords, etc? Yes  Adequate lighting in your home to  reduce risk of falls? Yes   ASSISTIVE DEVICES UTILIZED TO PREVENT FALLS:  Life alert? No  Use of a cane, walker or w/c? Yes  Grab bars in the bathroom? No  Shower chair or bench in shower? Yes  Elevated toilet seat or a handicapped toilet? Yes   TIMED UP AND GO:  Was the test performed? No .  Length of time to ambulate 10 feet: n/a sec.   Appearance of gait: Gait not evaluated during this visit.  Cognitive Function:        02/12/2022   10:22 AM  6CIT Screen  What Year? 0 points  What month? 0 points  What time? 0 points  Count back from 20 0 points  Months in reverse 0 points  Repeat phrase 0 points  Total Score 0 points    Immunizations Immunization History  Administered Date(s) Administered   Fluad Quad(high Dose 65+) 04/17/2019, 05/15/2020   Influenza Inj Mdck Quad Pf 04/17/2019   Influenza Split 04/26/2013, 05/10/2014   Influenza, High Dose Seasonal PF 04/27/2019   Influenza,inj,Quad PF,6+ Mos 05/19/2017   Influenza-Unspecified 05/10/2016, 05/06/2018, 04/18/2021   Moderna Covid-19 Vaccine Bivalent Booster 81yrs & up 05/13/2021   PFIZER SARS-COV-2 Pediatric Vaccination 5-39yrs 11/08/2020    PFIZER(Purple Top)SARS-COV-2 Vaccination 09/01/2019, 09/22/2019, 04/23/2020   Pneumococcal Conjugate-13 08/29/2015   Pneumococcal Polysaccharide-23 01/06/2013, 04/27/2019   Tdap 08/26/2012   Zoster Recombinat (Shingrix) 06/17/2020, 11/20/2020    TDAP status: Up to date  Flu Vaccine status: Up to date  Pneumococcal vaccine status: Up to date  Covid-19 vaccine status: Completed vaccines  Qualifies for Shingles Vaccine? Yes   Zostavax completed No   Shingrix Completed?: Yes  Screening Tests Health Maintenance  Topic Date Due   INFLUENZA VACCINE  02/24/2022   TETANUS/TDAP  08/26/2022   COLONOSCOPY (Pts 45-55yrs Insurance coverage will need to be confirmed)  11/24/2022   Pneumonia Vaccine 33+ Years old  Completed   COVID-19 Vaccine  Completed   Hepatitis C Screening  Completed   Zoster Vaccines- Shingrix  Completed   HPV VACCINES  Aged Out    Health Maintenance  There are no preventive care reminders to display for this patient.   Colorectal cancer screening: Type of screening: Colonoscopy. Completed 08/07/2021. Repeat every 1 years  Lung Cancer Screening: (Low Dose CT Chest recommended if Age 31-80 years, 30 pack-year currently smoking OR have quit w/in 15years.) does not qualify.   Lung Cancer Screening Referral: no  Additional Screening:  Hepatitis C Screening: does qualify; Completed 09/16/2017  Vision Screening: Recommended annual ophthalmology exams for early detection of glaucoma and other disorders of the eye. Is the patient up to date with their annual eye exam?  No  Who is the provider or what is the name of the office in which the patient attends annual eye exams? [facility] ([facility] in [place])  If pt is not established with a provider, would they like to be referred to a provider to establish care? No .   Dental Screening: Recommended annual dental exams for proper oral hygiene  Community Resource Referral / Chronic Care Management: CRR  required this visit?  No   CCM required this visit?  No      Plan:     I have personally reviewed and noted the following in the patient's chart:   Medical and social history Use of alcohol, tobacco or illicit drugs  Current medications and supplements including opioid prescriptions. Patient is currently taking opioid prescriptions. Information provided to  patient regarding non-opioid alternatives. Patient advised to discuss non-opioid treatment plan with their provider. Functional ability and status Nutritional status Physical activity Advanced directives List of other physicians Hospitalizations, surgeries, and ER visits in previous 12 months Vitals Screenings to include cognitive, depression, and falls Referrals and appointments  In addition, I have reviewed and discussed with patient certain preventive protocols, quality metrics, and best practice recommendations. A written personalized care plan for preventive services as well as general preventive health recommendations were provided to patient.     Sheral Flow, LPN   [id number]   Nurse Notes:  Patient is cogitatively intact. There were no vitals filed for this visit. There is no height or weight on file to calculate BMI.

## 2022-03-16 ENCOUNTER — Telehealth: Payer: Self-pay | Admitting: *Deleted

## 2022-03-16 NOTE — Telephone Encounter (Signed)
[name] Holder called for a refill of his Oxycodone 5 mg to be filled at [facility]

## 2022-03-17 ENCOUNTER — Other Ambulatory Visit: Payer: Self-pay | Admitting: Hematology and Oncology

## 2022-03-17 DIAGNOSIS — C7951 Secondary malignant neoplasm of bone: Secondary | ICD-10-CM

## 2022-03-17 MED ORDER — OXYCODONE HCL 5 MG PO TABS: 5.0000 mg | ORAL_TABLET | ORAL | 0 refills | Status: DC | PRN

## 2022-03-19 ENCOUNTER — Other Ambulatory Visit: Payer: Self-pay | Admitting: Oncology

## 2022-03-23 ENCOUNTER — Other Ambulatory Visit: Payer: Self-pay | Admitting: Internal Medicine

## 2022-03-23 DIAGNOSIS — I10 Essential (primary) hypertension: Secondary | ICD-10-CM

## 2022-03-26 ENCOUNTER — Encounter (HOSPITAL_COMMUNITY): Payer: Self-pay | Admitting: Radiology

## 2022-03-26 ENCOUNTER — Emergency Department (HOSPITAL_COMMUNITY)
Admission: EM | Admit: 2022-03-26 | Discharge: 2022-03-26 | Disposition: A | Discharge status: Home / Self Care | Payer: Medicare Other | Attending: Emergency Medicine | Admitting: Emergency Medicine

## 2022-03-26 ENCOUNTER — Emergency Department (HOSPITAL_COMMUNITY): Payer: Medicare Other

## 2022-03-26 ENCOUNTER — Other Ambulatory Visit: Payer: Self-pay

## 2022-03-26 DIAGNOSIS — R059 Cough, unspecified: Secondary | ICD-10-CM | POA: Diagnosis not present

## 2022-03-26 DIAGNOSIS — J690 Pneumonitis due to inhalation of food and vomit: Secondary | ICD-10-CM

## 2022-03-26 DIAGNOSIS — Z20822 Contact with and (suspected) exposure to covid-19: Secondary | ICD-10-CM | POA: Insufficient documentation

## 2022-03-26 DIAGNOSIS — Z8546 Personal history of malignant neoplasm of prostate: Secondary | ICD-10-CM | POA: Insufficient documentation

## 2022-03-26 DIAGNOSIS — Z7951 Long term (current) use of inhaled steroids: Secondary | ICD-10-CM | POA: Insufficient documentation

## 2022-03-26 DIAGNOSIS — J189 Pneumonia, unspecified organism: Secondary | ICD-10-CM | POA: Diagnosis not present

## 2022-03-26 DIAGNOSIS — R0602 Shortness of breath: Secondary | ICD-10-CM | POA: Diagnosis not present

## 2022-03-26 LAB — CBC WITH DIFFERENTIAL/PLATELET
Abs Immature Granulocytes: 0.45 10*3/uL — ABNORMAL HIGH (ref 0.00–0.07)
Basophils Absolute: 0.1 10*3/uL (ref 0.0–0.1)
Basophils Relative: 0 %
Eosinophils Absolute: 0 10*3/uL (ref 0.0–0.5)
Eosinophils Relative: 0 %
HCT: 38.8 % — ABNORMAL LOW (ref 39.0–52.0)
Hemoglobin: 13 g/dL (ref 13.0–17.0)
Immature Granulocytes: 2 %
Lymphocytes Relative: 5 %
Lymphs Abs: 1.2 10*3/uL (ref 0.7–4.0)
MCH: 29.1 pg (ref 26.0–34.0)
MCHC: 33.5 g/dL (ref 30.0–36.0)
MCV: 87 fL (ref 80.0–100.0)
Monocytes Absolute: 0.8 10*3/uL (ref 0.1–1.0)
Monocytes Relative: 3 %
Neutro Abs: 22.2 10*3/uL — ABNORMAL HIGH (ref 1.7–7.7)
Neutrophils Relative %: 90 %
Platelets: 270 10*3/uL (ref 150–400)
RBC: 4.46 MIL/uL (ref 4.22–5.81)
RDW: 13.6 % (ref 11.5–15.5)
WBC: 24.7 10*3/uL — ABNORMAL HIGH (ref 4.0–10.5)
nRBC: 0 % (ref 0.0–0.2)

## 2022-03-26 LAB — COMPREHENSIVE METABOLIC PANEL
ALT: 29 U/L (ref 0–44)
AST: 20 U/L (ref 15–41)
Albumin: 3.8 g/dL (ref 3.5–5.0)
Alkaline Phosphatase: 48 U/L (ref 38–126)
Anion gap: 11 (ref 5–15)
BUN: 18 mg/dL (ref 8–23)
CO2: 21 mmol/L — ABNORMAL LOW (ref 22–32)
Calcium: 8.9 mg/dL (ref 8.9–10.3)
Chloride: 102 mmol/L (ref 98–111)
Creatinine, Ser: 0.91 mg/dL (ref 0.61–1.24)
GFR, Estimated: >60 mL/min (ref >60)
Glucose, Bld: 143 mg/dL — ABNORMAL HIGH (ref 70–99)
Potassium: 3.8 mmol/L (ref 3.5–5.1)
Sodium: 134 mmol/L — ABNORMAL LOW (ref 135–145)
Total Bilirubin: 0.9 mg/dL (ref 0.3–1.2)
Total Protein: 6.6 g/dL (ref 6.5–8.1)

## 2022-03-26 LAB — RESP PANEL BY RT-PCR (FLU A&B, COVID) ARPGX2
Influenza A by PCR: NEGATIVE
Influenza B by PCR: NEGATIVE
SARS Coronavirus 2 by RT PCR: NEGATIVE

## 2022-03-26 MED ORDER — OXYCODONE HCL 5 MG PO TABS
5.0000 mg | ORAL_TABLET | Freq: Once | ORAL | Status: AC
  Administered 2022-03-26: 5 mg via ORAL
  Filled 2022-03-26: qty 1

## 2022-03-26 NOTE — Discharge Instructions (Signed)
You were seen in the emergency department for evaluation of possible pneumonia in the setting of an aspiration.  Your chest x-ray did show some signs of some infiltrates in both lower lobes.  We discussed admission to the hospital versus close outpatient follow-up and you felt well enough to go home.  Please continue your antibiotics and steroids.  Follow-up with your primary care doctor.  Return to the emergency department if any worsening or concerning symptoms.

## 2022-03-26 NOTE — ED Triage Notes (Addendum)
Pt states last night he feels like he aspirated, which he has done multiple times in the past. Began having shortness of breath. He was seen at [facility] urgent care this morning and was given the diagnosis of pneumonia. A chest x ray was done. Was prescribed augmenting and prednisone as well as given a rocephin shot prior to discharge. Pt has had fever but took tylenol prior to arrival. Pt came due to feeling like his shortness of breath was getting worse and he became more confused.

## 2022-03-26 NOTE — ED Provider Notes (Signed)
[facility] DEPT Provider Note   CSN: [id number] Arrival date & time: 03/26/22  1613     History  Chief Complaint  Patient presents with   Pneumonia    [name] Holder is a 66 y.o. male.  He has a history of prostate cancer, compression fractures, asthma, COPD.  He said he aspirated early this morning around 4 AM causing coughing and shortness of breath.  He went to an urgent care where they gave him a dose of ceftriaxone and put him on Augmentin and prednisone, had a chest x-ray that showed possible bilateral infiltrates.  He said since he has been home he is felt a little more short of breath, although he is not sure if he is just nervous.  Minimally productive cough.  No chest pain.  Had a low-grade temperature of 100 at urgent care.  Otherwise no nausea vomiting diarrhea.  Does endorse some chronic back pain and is due for a pain pill.  The history is provided by the patient.  Shortness of Breath Severity:  Moderate Onset quality:  Sudden Duration:  1 day Timing:  Intermittent Progression:  Unchanged Chronicity:  Recurrent Context comment:  Aspiration Relieved by:  Nothing Worsened by:  Activity and coughing Ineffective treatments:  Lying down Associated symptoms: cough and fever   Associated symptoms: no abdominal pain, no chest pain, no hemoptysis, no sputum production and no wheezing   Risk factors: no tobacco use        Home Medications Prior to Admission medications   Medication Sig Start Date End Date Taking? Authorizing Provider  abiraterone acetate (ZYTIGA) 250 MG tablet Take 4 tablets (1,000 mg total) by mouth daily. 10/24/21   Wyatt Portela, MD  acetaminophen (TYLENOL) 325 MG tablet Take 325 mg by mouth daily as needed for fever.    [provider]  albuterol (PROVENTIL) (2.5 MG/3ML) 0.083% nebulizer solution NEBULIZE 1 VIAL EVERY 6-8 HOURS AS NEEDED FOR WHEEZING OR SHORTNESS OF BREATH 02/09/22   [name], MD   albuterol (VENTOLIN HFA) 108 (90 Base) MCG/ACT inhaler Inhale 2 puffs into the lungs every 6 (six) hours as needed for wheezing or shortness of breath. 01/02/22   [name], MD  azelastine (ASTELIN) 0.1 % nasal spray Place 2 puffs into the nose in the morning and at bedtime. 12/24/21   [name], MD  CALCIUM-VITAMIN D PO Take 1 tablet by mouth 2 (two) times daily.     [provider]  carvedilol (COREG) 3.125 MG tablet TAKE (1) TABLET TWICE A DAY WITH MEALS (BREAKFAST AND SUPPER) 04/29/21   [name], MD  denosumab (PROLIA) 60 MG/ML SOSY injection Inject 60 mg into the skin every 6 (six) months.    [provider]  esomeprazole (NEXIUM) 20 MG capsule Take 20 mg by mouth daily at 12 noon.    [provider]  fluticasone (FLONASE) 50 MCG/ACT nasal spray Place 2 sprays into both nostrils daily. 01/06/22   [name], MD  Fluticasone-Umeclidin-Vilant (TRELEGY ELLIPTA) 100-62.5-25 MCG/ACT AEPB INHALE 1 PUFF INTO LUNGS DAILY 01/02/22   [name], MD  furosemide (LASIX) 20 MG tablet TAKE ONE TABLET TWICE DAILY 03/23/22   [name], MD  Leuprolide Acetate (LUPRON IJ) Inject 1 Dose as directed every 4 (four) months. This is administered by [facility]    [provider]  Multiple Vitamins-Minerals (CENTRUM SILVER 50+MEN PO) Take 1 tablet by mouth daily.    [provider]  NARCAN 4 MG/0.1ML LIQD nasal spray kit Place 0.4 mg into the nose once as needed (overdose).  08/18/18   [provider]  olmesartan (BENICAR) 40 MG tablet Take 1 tablet (40 mg total) by mouth daily. 01/02/22   [name], MD  oxyCODONE (OXY IR/ROXICODONE) 5 MG immediate release tablet Take 1 tablet (5 mg total) by mouth every 4 (four) hours as needed for severe pain. 03/17/22   Orson Slick, MD  polyethylene glycol powder (GLYCOLAX/MIRALAX) 17 GM/SCOOP powder Take 17 g (1 capful) by mouth daily as directed. 06/05/21   Wyatt Portela, MD  predniSONE  (DELTASONE) 5 MG tablet TAKE 1 TABLET 2 TIMES A DAY 12/05/21   Brunetta Genera, MD  sodium chloride 1 g tablet Take 1 g by mouth 2 (two) times daily with a meal.    [provider]  solifenacin (VESICARE) 5 MG tablet Take 5 mg by mouth daily. 08/11/19   [provider]  Specialty Vitamins Products (VISTA ADVANCED CAROTENOID) CAPS Take 1 capsule by mouth every evening.    [provider]  vitamin C (ASCORBIC ACID) 500 MG tablet Take 500 mg by mouth every evening.    [provider]  vitamin E 180 MG (400 UNITS) capsule Take 400 Units by mouth every evening.    [provider]      Allergies    Amlodipine and Tetracycline    Review of Systems   Review of Systems  Constitutional:  Positive for fever.  Respiratory:  Positive for cough and shortness of breath. Negative for hemoptysis, sputum production and wheezing.   Cardiovascular:  Negative for chest pain.  Gastrointestinal:  Negative for abdominal pain.  Musculoskeletal:  Positive for back pain.    Physical Exam Updated Vital Signs BP (!) 115/55   Pulse 100   Temp 98.8 F (37.1 C) (Oral)   Resp (!) 21   Ht _0  (1.727 m)   Wt 98.9 kg   SpO2 98%   BMI 33.15 kg/m  Physical Exam Vitals and nursing note reviewed.  Constitutional:      General: He is not in acute distress.    Appearance: Normal appearance. He is well-developed.  HENT:     Head: Normocephalic and atraumatic.  Eyes:     Conjunctiva/sclera: Conjunctivae normal.  Cardiovascular:     Rate and Rhythm: Regular rhythm. Tachycardia present.     Heart sounds: No murmur heard. Pulmonary:     Effort: Pulmonary effort is normal. No respiratory distress.     Breath sounds: Normal breath sounds. No wheezing.  Abdominal:     Palpations: Abdomen is soft.     Tenderness: There is no abdominal tenderness. There is no guarding or rebound.  Musculoskeletal:        General: No swelling.     Cervical back: Neck supple.  Skin:     General: Skin is warm and dry.     Capillary Refill: Capillary refill takes less than 2 seconds.  Neurological:     General: No focal deficit present.     Mental Status: He is alert.     ED Results / Procedures / Treatments   Labs (all labs ordered are listed, but only abnormal results are displayed) Labs Reviewed  COMPREHENSIVE METABOLIC PANEL - Abnormal; Notable for the following components:      Result Value   Sodium 134 (*)    CO2 21 (*)    Glucose, Bld 143 (*)  All other components within normal limits  CBC WITH DIFFERENTIAL/PLATELET - Abnormal; Notable for the following components:   WBC 24.7 (*)    HCT 38.8 (*)    Neutro Abs 22.2 (*)    Abs Immature Granulocytes 0.45 (*)    All other components within normal limits  RESP PANEL BY RT-PCR (FLU A&B, COVID) [id number]    EKG EKG Interpretation  Date/Time:  Thursday March 26 2022 17:28:21 EDT Ventricular Rate:  101 PR Interval:  155 QRS Duration: 94 QT Interval:  343 QTC Calculation: 445 R Axis:   67 Text Interpretation: Sinus tachycardia No significant change since prior 2/23 Confirmed by Aletta Edouard [phone]) on 03/26/2022 5:42:27 PM  Radiology DG Chest 2 View  Result Date: 03/26/2022 CLINICAL DATA:  Pneumonia. EXAM: CHEST - 2 VIEW COMPARISON:  Chest radiograph August 08/25/2021. FINDINGS: The heart size and mediastinal contours are unchanged. Similar appearance of the right-greater-than-left bibasilar airspace opacities seen on same-day radiograph at 1042 hours. No pleural effusion or pneumothorax. No significant interval change in the chronic thoracic compression fractures with augmentation at some levels. IMPRESSION: Multifocal pneumonia not significantly changed recommend radiographic follow-up to resolution. Electronically Signed   By: Dahlia Bailiff M.D.   On: 03/26/2022 18:05    Procedures Procedures    Medications Ordered in ED Medications  oxyCODONE (Oxy IR/ROXICODONE) immediate release tablet 5 mg (5  mg Oral Given 03/26/22 1758)    ED Course/ Medical Decision Making/ A&P Clinical Course as of 03/27/22 0918  Thu Mar 26, 2022  1810 Chest x-ray showing bilateral infiltrates lower lobe.  Awaiting radiology reading. [MB]  1935 Patient's labs show elevated white count question related to infection versus the steroids he received earlier.  Sodium stable and bicarb a little low.  COVID and flu negative.  Oxygenation has been 94 to 96% on room air.  He is lying down flat no distress speaking in full sentences.  Had shared decision-making with him and his wife regarding admission.  They said he feels a lot better right now and would rather take him home.  They understand that the condition can change and he may end up needing to return.  At this point I think this is reasonable as he looks very well and is minimally symptomatic.  Return instructions discussed [MB]    Clinical Course User Index [MB] Hayden Rasmussen, MD                           Medical Decision Making Risk Prescription drug management.  This patient complains of choking episode shortness of breath; this involves an extensive number of treatment Options and is a complaint that carries with it a high risk of complications and morbidity. The differential includes aspiration pneumonia, reflux, pneumonitis, pneumonia  I ordered, reviewed and interpreted labs, which included CBC with elevated white count, stable hemoglobin, chemistries fairly unremarkable other than mildly low bicarb and elevated glucose, COVID and flu negative I ordered medication oral pain medication and reviewed PMP when indicated. I ordered imaging studies which included chest x-ray and I independently    visualized and interpreted imaging which showed bibasilar infiltrates Additional history obtained from patient's wife Previous records obtained and reviewed in epic including urgent care visit today  Cardiac monitoring reviewed, normal sinus rhythm Social  determinants considered, no significant barriers Critical Interventions: None  After the interventions stated above, I reevaluated the patient and found patient to be oxygenating well on room air Admission  and further testing considered, we discussed admission versus close outpatient follow-up.  With shared decision-making patient elects to go home and keep an eye on his symptoms there.  He will continue his Augmentin and prednisone.  Recommended close follow-up with PCP and return instructions discussed.          Final Clinical Impression(s) / ED Diagnoses Final diagnoses:  Aspiration pneumonia of both lower lobes due to gastric secretions [facility])    Rx / DC Orders ED Discharge Orders     None         Hayden Rasmussen, MD 03/27/22 [phone]

## 2022-03-26 NOTE — ED Provider Triage Note (Signed)
Emergency Medicine Provider Triage Evaluation Note  [name] Holder , a 66 y.o. male  was evaluated in triage.  Pt complains of pneumonia.  Patient states that last night he got up at around 4 AM to take his medications.  States that he drinks Ensure with these medications.  Went back to bed and then aspirated.  Throughout the morning had shortness of breath so went to urgent care and was diagnosed with pneumonia.  He was given IM Rocephin and then placed on amoxicillin and prednisone.  States that the shortness of breath has progressively worsened all day and he is concerned.  He denies further fevers.  Wife also states he was a little confused this afternoon.  States that he has a history of SIADH and is also concerned about his sodium level..  Review of Systems  Positive: The above Negative:   Physical Exam  BP 106/72 (BP Location: Left Arm)   Pulse (!) 110   Temp 98.8 F (37.1 C) (Oral)   Resp 17   Ht '5\' 8"'$  (1.727 m)   Wt 98.9 kg   SpO2 98%   BMI 33.15 kg/m  Gen:   Awake, no distress   Resp:  Normal effort  MSK:   Moves extremities without difficulty  Other:  Adventitious lung sounds on the right  Medical Decision Making  Medically screening exam initiated at 5:12 PM.  Appropriate orders placed.  [name] Holder was informed that the remainder of the evaluation will be completed by another provider, this initial triage assessment does not replace that evaluation, and the importance of remaining in the ED until their evaluation is complete.     Mickie Hillier, PA-C 03/26/22 [phone]

## 2022-04-08 ENCOUNTER — Other Ambulatory Visit: Payer: Self-pay | Admitting: Internal Medicine

## 2022-04-08 DIAGNOSIS — J4521 Mild intermittent asthma with (acute) exacerbation: Secondary | ICD-10-CM

## 2022-04-14 ENCOUNTER — Ambulatory Visit (INDEPENDENT_AMBULATORY_CARE_PROVIDER_SITE_OTHER): Payer: Medicare Other

## 2022-04-14 ENCOUNTER — Ambulatory Visit (INDEPENDENT_AMBULATORY_CARE_PROVIDER_SITE_OTHER): Payer: Medicare Other | Admitting: Internal Medicine

## 2022-04-14 ENCOUNTER — Encounter: Payer: Self-pay | Admitting: Internal Medicine

## 2022-04-14 VITALS — BP 124/82 | HR 79 | Temp 98.4°F | Ht 68.0 in | Wt 219.0 lb

## 2022-04-14 DIAGNOSIS — J9811 Atelectasis: Secondary | ICD-10-CM | POA: Diagnosis not present

## 2022-04-14 DIAGNOSIS — I10 Essential (primary) hypertension: Secondary | ICD-10-CM

## 2022-04-14 DIAGNOSIS — J189 Pneumonia, unspecified organism: Secondary | ICD-10-CM | POA: Diagnosis not present

## 2022-04-14 NOTE — Patient Instructions (Signed)
Aspiration Pneumonia, Adult  Aspiration pneumonia is an infection that occurs after lung (pulmonary) aspiration. Pulmonary aspiration is when you inhale a large amount of food, liquid, stomach acid, or saliva into the lungs. This can cause inflammation and infection in the lungs. This can make you cough and make it hard to breathe. Aspiration pneumonia is a serious condition and can be life-threatening. What are the causes? This condition may be caused by: Bacteria in food, liquid, stomach acid, or saliva that is inhaled into the lung. Irritation and inflammation that results from material, such as blood or a foreign body, being inhaled into the lung. This can lead to an infection even though the material is not originally contaminated with bacteria. What increases the risk? You are more likely to get aspiration pneumonia if you have a condition that makes it hard to breathe, swallow, cough, or gag. These conditions may include: A breathing disorder, such as chronic obstructive pulmonary disease, that makes it hard to eat or drink while breathing. A brain (neurologic) disorder, such as stroke, seizures, Parkinson's disease, dementia, amyotrophic lateral sclerosis (ALS), or brain injury. Having gastroesophageal reflux disease (GERD). Having a weak disease-fighting system (immune system). Having a narrowing of the tube that carries food to the stomach (esophageal narrowing). Other factors that may make you more likely to get aspiration pneumonia include: Being older than age 60 and frail. Being given a general anesthetic for procedures. Drinking too much alcohol and passing out. If you pass out and vomit, then vomit can be inhaled into your lungs. Taking certain medicines, such as tranquilizers or sedatives. Taking poor care of your mouth and teeth. Being malnourished. What are the signs or symptoms? The main symptom of pulmonary aspiration may be an episode of choking or coughing while eating or  drinking. When aspiration pneumonia develops, symptoms include: Persistent cough. Difficulty breathing, such as wheezing or shortness of breath. Fever. Chest pain. Being more tired than usual (fatigue). Pulmonary aspiration may be silent, meaning that it is not associated with coughing or choking while eating or drinking. How is this diagnosed? This condition may be diagnosed based on: A physical exam. Tests, such as: A chest X-ray. A sputum culture. Saliva and mucus (sputum) are collected from the lungs or the tubes that carry air to the lungs (bronchi). The sputum is then tested for bacteria. Oximetry. A sensor or clip is placed on areas such as a finger, earlobe, or toe to measure the oxygen level in your blood. Blood tests. A swallowing study. This test looks at how food is swallowed and whether it goes into your windpipe (trachea) or esophagus. A bronchoscopy. This test uses a flexible tube (bronchoscope) to see inside the lungs. How is this treated? This condition may be treated with: Medicines. Antibiotic medicine will be given to kill the pneumonia bacteria. Other medicines may also be used to reduce fever, pain, or inflammation. Breathing assistance and oxygen therapy. Depending on how well you are breathing, you may need to be given oxygen, or you may need breathing support from a breathing machine (ventilator). Thoracentesis. This is a procedure to remove fluid that has built up in the space between the linings of the chest wall and the lungs. Dietary changes. You may need to avoid certain food textures or liquids. For people who have recurrent aspiration pneumonia, a feeding tube might be placed in the stomach for nutrition. Follow these instructions at home: Medicines  Take over-the-counter and prescription medicines only as told by your health care   provider. If you were prescribed an antibiotic medicine, take it as told by your health care provider. Do not stop taking the  antibiotic even if you start to feel better. Take cough medicine only if you are losing sleep. Cough medicine can prevent your body's natural ability to remove mucus from your lungs. General instructions  Carefully follow any eating instructions you were given, such as avoiding certain food textures or thickening your liquids. Thickening liquids reduces the risk of developing aspiration pneumonia again. Return to normal activities as told by your health care provider. Ask your health care provider what activities are safe for you. Sleep in a semi-upright position at night. Try to sleep in a reclining chair, or place a few pillows under your head in bed. Do not use any products that contain nicotine or tobacco, such as cigarettes, e-cigarettes, and chewing tobacco. If you need help quitting, ask your health care provider. Keep all follow-up visits as told by your health care provider. This is important. Contact a health care provider if you: Have a fever. Are coughing or choking while eating or drinking. Continue to have signs or symptoms of aspiration pneumonia. Get help right away if you have: Worsening shortness of breath, wheezing, or difficulty breathing. Chest pain. Summary Aspiration pneumonia is an infection that occurs after lung (pulmonary) aspiration. Pulmonary aspiration is when you inhale a large amount of food, liquid, stomach acid, or saliva into the lungs. The main symptom of pulmonary aspiration may be an episode of choking or coughing. It may also be silent without coughing or choking. You are more likely to get aspiration pneumonia if you have a condition that makes it hard to breathe, swallow, cough, or gag. This information is not intended to replace advice given to you by your health care provider. Make sure you discuss any questions you have with your health care provider. Document Revised: 07/14/2019 Document Reviewed: 07/14/2019 Elsevier Patient Education  2023 Elsevier  Inc.  

## 2022-04-14 NOTE — Progress Notes (Unsigned)
Subjective:  Patient ID: [name] Holder, male    DOB: 1955/09/29  Age: 66 y.o. MRN: [id number]  CC: No chief complaint on file.   HPI MICAL KICKLIGHTER presents for ***  Outpatient Medications Prior to Visit  Medication Sig Dispense Refill   abiraterone acetate (ZYTIGA) 250 MG tablet Take 4 tablets (1,000 mg total) by mouth daily. 120 tablet 10   acetaminophen (TYLENOL) 325 MG tablet Take 325 mg by mouth daily as needed for fever.     albuterol (PROVENTIL) (2.5 MG/3ML) 0.083% nebulizer solution NEBULIZE 1 VIAL EVERY 6-8 HOURS AS NEEDED FOR WHEEZING OR SHORTNESS OF BREATH 360 mL 1   albuterol (VENTOLIN HFA) 108 (90 Base) MCG/ACT inhaler Inhale 2 puffs into the lungs every 6 (six) hours as needed for wheezing or shortness of breath. 18 g 3   azelastine (ASTELIN) 0.1 % nasal spray Place 2 puffs into the nose in the morning and at bedtime. 90 mL 1   CALCIUM-VITAMIN D PO Take 1 tablet by mouth 2 (two) times daily.      carvedilol (COREG) 3.125 MG tablet TAKE (1) TABLET TWICE A DAY WITH MEALS (BREAKFAST AND SUPPER) 180 tablet 1   denosumab (PROLIA) 60 MG/ML SOSY injection Inject 60 mg into the skin every 6 (six) months.     esomeprazole (NEXIUM) 20 MG capsule Take 20 mg by mouth daily at 12 noon.     fluticasone (FLONASE) 50 MCG/ACT nasal spray Place 2 sprays into both nostrils daily. 48 g 1   Fluticasone-Umeclidin-Vilant (TRELEGY ELLIPTA) 100-62.5-25 MCG/ACT AEPB INHALE 1 PUFF INTO LUNGS DAILY 120 each 1   furosemide (LASIX) 20 MG tablet TAKE ONE TABLET TWICE DAILY 180 tablet 1   Leuprolide Acetate (LUPRON IJ) Inject 1 Dose as directed every 4 (four) months. This is administered by [facility]     Multiple Vitamins-Minerals (CENTRUM SILVER 50+MEN PO) Take 1 tablet by mouth daily.     NARCAN 4 MG/0.1ML LIQD nasal spray kit Place 0.4 mg into the nose once as needed (overdose).      olmesartan (BENICAR) 40 MG tablet Take 1 tablet (40 mg total) by mouth daily. 90 tablet 1   oxyCODONE (OXY  IR/ROXICODONE) 5 MG immediate release tablet Take 1 tablet (5 mg total) by mouth every 4 (four) hours as needed for severe pain. 180 tablet 0   polyethylene glycol powder (GLYCOLAX/MIRALAX) 17 GM/SCOOP powder Take 17 g (1 capful) by mouth daily as directed. 510 g 6   predniSONE (DELTASONE) 5 MG tablet TAKE 1 TABLET 2 TIMES A DAY 90 tablet 3   sodium chloride 1 g tablet Take 1 g by mouth 2 (two) times daily with a meal.     solifenacin (VESICARE) 5 MG tablet Take 5 mg by mouth daily.     Specialty Vitamins Products (VISTA ADVANCED CAROTENOID) CAPS Take 1 capsule by mouth every evening.     vitamin C (ASCORBIC ACID) 500 MG tablet Take 500 mg by mouth every evening.     vitamin E 180 MG (400 UNITS) capsule Take 400 Units by mouth every evening.     No facility-administered medications prior to visit.    ROS Review of Systems  Objective:  There were no vitals taken for this visit.  BP Readings from Last 3 Encounters:  03/26/22 (!) 101/53  02/10/22 109/72  12/31/21 116/72    Wt Readings from Last 3 Encounters:  03/26/22 218 lb (98.9 kg)  02/10/22 216 lb 14.4 oz (98.4 kg)  11/07/21  212 lb 6.4 oz (96.3 kg)    Physical Exam  Lab Results  Component Value Date   WBC 24.7 (H) 03/26/2022   HGB 13.0 03/26/2022   HCT 38.8 (L) 03/26/2022   PLT 270 03/26/2022   GLUCOSE 143 (H) 03/26/2022   CHOL 173 10/28/2021   TRIG 167.0 (H) 10/28/2021   HDL 38.10 (L) 10/28/2021   LDLCALC 101 (H) 10/28/2021   ALT 29 03/26/2022   AST 20 03/26/2022   NA 134 (L) 03/26/2022   K 3.8 03/26/2022   CL 102 03/26/2022   CREATININE 0.91 03/26/2022   BUN 18 03/26/2022   CO2 21 (L) 03/26/2022   TSH 0.86 09/30/2020   PSA 0.7 07/30/2021   INR 1.1 09/17/2021   HGBA1C 5.7 10/28/2021    DG Chest 2 View  Result Date: 03/26/2022 CLINICAL DATA:  Pneumonia. EXAM: CHEST - 2 VIEW COMPARISON:  Chest radiograph August 08/25/2021. FINDINGS: The heart size and mediastinal contours are unchanged. Similar appearance of  the right-greater-than-left bibasilar airspace opacities seen on same-day radiograph at 1042 hours. No pleural effusion or pneumothorax. No significant interval change in the chronic thoracic compression fractures with augmentation at some levels. IMPRESSION: Multifocal pneumonia not significantly changed recommend radiographic follow-up to resolution. Electronically Signed   By: Dahlia Bailiff M.D.   On: 03/26/2022 18:05   DG Chest 2 View  Result Date: 04/14/2022 CLINICAL DATA:  Pneumonia follow-up. EXAM: CHEST - 2 VIEW COMPARISON:  Chest x-ray dated March 26, 2022. FINDINGS: The heart size and mediastinal contours are within normal limits. Normal pulmonary vascularity. Resolved bibasilar pneumonia with residual minimal left basilar atelectasis. Unchanged mild scarring in the lingula and right middle lobe. No pleural effusion or pneumothorax. No acute osseous abnormality. Multiple chronic thoracolumbar compression deformities again noted. IMPRESSION: 1. Resolved bibasilar pneumonia. Electronically Signed   By: Titus Dubin M.D.   On: 04/14/2022 11:59      Assessment & Plan:   There are no diagnoses linked to this encounter. I am having Birch Farino. Constable maintain his CALCIUM-VITAMIN D PO, Leuprolide Acetate (LUPRON IJ), Narcan, acetaminophen, Multiple Vitamins-Minerals (CENTRUM SILVER 50+MEN PO), solifenacin, sodium chloride, denosumab, carvedilol, polyethylene glycol powder, ascorbic acid, vitamin E, [facility] Carotenoid, esomeprazole, abiraterone acetate, predniSONE, azelastine, albuterol, Fluticasone-Umeclidin-Vilant, olmesartan, fluticasone, oxyCODONE, furosemide, and albuterol.  No orders of the defined types were placed in this encounter.    Follow-up: No follow-ups on file.  Scarlette Calico, MD

## 2022-04-15 DIAGNOSIS — J690 Pneumonitis due to inhalation of food and vomit: Secondary | ICD-10-CM | POA: Diagnosis not present

## 2022-04-15 DIAGNOSIS — T17908A Unspecified foreign body in respiratory tract, part unspecified causing other injury, initial encounter: Secondary | ICD-10-CM | POA: Diagnosis not present

## 2022-04-15 DIAGNOSIS — M4854XA Collapsed vertebra, not elsewhere classified, thoracic region, initial encounter for fracture: Secondary | ICD-10-CM | POA: Diagnosis not present

## 2022-04-15 DIAGNOSIS — M4856XA Collapsed vertebra, not elsewhere classified, lumbar region, initial encounter for fracture: Secondary | ICD-10-CM | POA: Diagnosis not present

## 2022-04-15 DIAGNOSIS — J984 Other disorders of lung: Secondary | ICD-10-CM | POA: Diagnosis not present

## 2022-04-20 ENCOUNTER — Telehealth: Payer: Self-pay | Admitting: *Deleted

## 2022-04-20 ENCOUNTER — Other Ambulatory Visit: Payer: Self-pay | Admitting: Oncology

## 2022-04-20 DIAGNOSIS — C7951 Secondary malignant neoplasm of bone: Secondary | ICD-10-CM

## 2022-04-20 MED ORDER — OXYCODONE HCL 5 MG PO TABS: 5.0000 mg | ORAL_TABLET | ORAL | 0 refills | Status: DC | PRN

## 2022-04-20 NOTE — Telephone Encounter (Signed)
[name] Holder is requesting a refill of Oxycodone 5 mg to be sent to [facility] in [place]

## 2022-04-23 ENCOUNTER — Telehealth: Payer: Self-pay | Admitting: Oncology

## 2022-04-23 NOTE — Telephone Encounter (Signed)
Called patient regarding upcoming October appointments, patient is notified.  

## 2022-04-29 ENCOUNTER — Ambulatory Visit: Payer: Medicare Other | Admitting: Internal Medicine

## 2022-05-02 ENCOUNTER — Other Ambulatory Visit: Payer: Self-pay | Admitting: Internal Medicine

## 2022-05-02 DIAGNOSIS — J4520 Mild intermittent asthma, uncomplicated: Secondary | ICD-10-CM

## 2022-05-05 ENCOUNTER — Other Ambulatory Visit: Payer: Self-pay | Admitting: Internal Medicine

## 2022-05-05 DIAGNOSIS — I10 Essential (primary) hypertension: Secondary | ICD-10-CM

## 2022-05-11 ENCOUNTER — Encounter: Payer: Self-pay | Admitting: Pulmonary Disease

## 2022-05-11 ENCOUNTER — Ambulatory Visit: Payer: Medicare Other | Admitting: Pulmonary Disease

## 2022-05-11 VITALS — BP 112/82 | HR 89 | Temp 98.0°F | Ht 68.0 in | Wt 222.0 lb

## 2022-05-11 DIAGNOSIS — Z6834 Body mass index (BMI) 34.0-34.9, adult: Secondary | ICD-10-CM | POA: Diagnosis not present

## 2022-05-11 DIAGNOSIS — J4520 Mild intermittent asthma, uncomplicated: Secondary | ICD-10-CM

## 2022-05-11 DIAGNOSIS — J4521 Mild intermittent asthma with (acute) exacerbation: Secondary | ICD-10-CM

## 2022-05-11 DIAGNOSIS — J4489 Other specified chronic obstructive pulmonary disease: Secondary | ICD-10-CM

## 2022-05-11 MED ORDER — ALBUTEROL SULFATE (2.5 MG/3ML) 0.083% IN NEBU: 2.5000 mg | INHALATION_SOLUTION | Freq: Four times a day (QID) | RESPIRATORY_TRACT | 11 refills | Status: DC | PRN

## 2022-05-11 MED ORDER — TRELEGY ELLIPTA 100-62.5-25 MCG/ACT IN AEPB: 1.0000 | INHALATION_SPRAY | Freq: Every day | RESPIRATORY_TRACT | 3 refills | Status: DC

## 2022-05-11 MED ORDER — ALBUTEROL SULFATE HFA 108 (90 BASE) MCG/ACT IN AERS: 2.0000 | INHALATION_SPRAY | Freq: Four times a day (QID) | RESPIRATORY_TRACT | 3 refills | Status: DC | PRN

## 2022-05-11 NOTE — Progress Notes (Signed)
Synopsis: Follow up from hospitalization Feb 2021, PCP: [name], MD  Subjective:   PATIENT ID: [name] Holder GENDER: male DOB: August 08, 1955, MRN: [id number]  Chief Complaint  Patient presents with   Follow-up    6 months, aspirating food ( pep AC night)     PMH Asthma, Former smoker, quit 5 years, smoked on and off since college, heaviest 1 pack per day, menthol newports. H/o of prostate cancer, and tumor removal, bladder cancer. On prednisone 60m daily, leupron and abaraterone.  Patient was initially [facility] by myself in [facility]  He is admitted to the hospital for hallucinations and found to be hyponatremic.  He has a history of prostate cancer on chronic prednisone.  His CT scan on admission had a right middle lobe nodular opacity that was matched on chest x-ray imaging concerning for community-acquired pneumonia.  He was treated by outpatient provider with Bactrim.  He is a former smoker quit 5 years ago as stated above.  Has a history of asthma as a child.  Currently managed with duo nebs.  Ultimately the decision was made that the patient be brought to the intensive care unit placed on 3% hypertonic saline.  He had improvement of his mentation slowly.  There was also concern for possible cross-reactivity from taking extra doses of his opiate pain medication.  OV 08/31/2019: Patient here today for follow-up regarding the abnormalities on his CAT scan imaging as well as history of asthma possible COPD from his longstanding history of smoking.  Overall doing great in comparison to where he was at his last evaluation.  He does have shortness of breath with exertion.  He does present today in a wheelchair.  Wife is with him.  OV 11/08/2019: Patient here for follow-up after pulmonary function test.  PFTs completed prior to office visit.  Pulmonary function test completed with a reduced FEV1 and FVC at 69% predicted, no significant  bronchodilator response RV with 145% predicted, DLCO 80% predicted.  Patient has been seen by endocrinology started on salt tablet replacement.  He has a CT scan planned for follow-up with his right middle lobe consolidation which I believe to be community-acquired pneumonia related to his hospitalization however we need to ensure resolution of this.  He is currently managed with Symbicort.  Was recently changed to a new prostate cancer medication.  Overall doing well.  OV 12/25/2020: Here today for follow-up regarding asthma COPD overlap.  Former smoker, asthma since childhood.  Currently maintained on triple therapy inhaler.  Doing well from that standpoint.  Is obese BMI 34 has been trying to lose some weight.  Had a recently elevated PSA.  Thought to potentially have a reaction, reaction to Lupron injection.  Also on a testosterone inhibitor for his prostate cancer.  Had a recent CT abdomen pelvis with no evidence of metastatic disease.  Respiratory standpoint doing well.   OV 05/11/2022: Here today for follow-up for asthma COPD overlap syndrome.  He needs refills of his medications.  His recent PSA has been stable.  He is getting Lupron injections with oncology.  Overall has had a good report from both.  He has had a few episodes of pneumonia related to what felt to be related to aspiration.  His recent chest x-ray showed clearance.  He had an MBSS in February.  He has an appointment to see GI soon.  Currently taking PPI and nightly Pepcid.    Past Medical History:  Diagnosis Date   Allergic rhinitis    At risk for sleep apnea    STOP-BANG SCORE = 5    (routed to pt's pcp 09-08-2018)   Bladder cancer ([place]) dx'd 2020   Chronic constipation    Chronic low back pain    Chronic pain    DOE (dyspnea on exertion)    History of kidney stones    History of recent pneumonia 08/10/2018   acute bronchopneumonia;  follow-up cxr 08-28-2018 in epic   History of vertebral compression fracture    T10 and  T12 s/p kyphoplasty   Hx of colonic polyps    Hyperplasia of prostate with lower urinary tract symptoms (LUTS)    Hypertension    Mild persistent asthma    followed by pcp   Numbness in both legs    secondary to a fall, per pt has had work-up and couldn't find anything   Osteopenia    Prostate cancer metastatic to pelvis [facility]) urologist-- dr borden/  oncologist -- dr Alen Blew   dx 2014 advanced prostate cancer with pelvid adenopathy,  Stage T2c, Gleason 7;  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   Wears glasses      Family History  Problem Relation Age of Onset   Arthritis Mother    Diabetes Mother    Hypertension Mother    Hyperlipidemia Mother    Hypertension Father    Prostate cancer Father        radiation in mid 62s   Heart attack Father    Cancer Sister        precancerous colon polyps, 6 in colon removed   Stroke Maternal Grandfather    Other Neg Hx        hyponatremia     Past Surgical History:  Procedure Laterality Date   COLONOSCOPY     IR KYPHO EA ADDL LEVEL THORACIC OR LUMBAR  08/17/2018   IR KYPHO EA ADDL LEVEL THORACIC OR LUMBAR  08/26/2018   IR KYPHO LUMBAR INC FX REDUCE BONE BX UNI/BIL CANNULATION INC/IMAGING  08/26/2018   IR KYPHO THORACIC WITH BONE BIOPSY  08/17/2018   IR RADIOLOGIST EVAL & MGMT  08/11/2018   IR RADIOLOGIST EVAL & MGMT  08/24/2018   LYMPHADENECTOMY Left 01/05/2013   Procedure: ROBOTIC LYMPHADENECTOMY;  Surgeon: Dutch Gray, MD;  Location: [facility];  Service: Urology;  Laterality: Left;   PROSTATE BIOPSY     11/12 positive biopsies   ROTATOR CUFF REPAIR Left 2012   TRANSURETHRAL RESECTION OF BLADDER TUMOR N/A 09/15/2018   Procedure: TRANSURETHRAL RESECTION OF BLADDER TUMOR (TURBT)WITH CYSTOSCOPY/ POST OPERATIVE INSTILLATION OF GEMCITABINE;  Surgeon: Raynelle Bring, MD;  Location: [facility];  Service: Urology;  Laterality: N/A;  GENERAL ANESTHESIA WITH PARALYSIS   UMBILICAL HERNIA REPAIR  09-02-2001   dr t. price _0     Social History   Socioeconomic History    Marital status: Married    Spouse name: Not on file   Number of children: Not on file   Years of education: 16   Highest education level: Not on file  Occupational History   Occupation: Cabin crew    Employer: [employer]    Comment: retired  Tobacco Use   Smoking status: Former    Packs/day: 1.00    Years: 25.00    Total pack years: 25.00    Types: Cigarettes    Quit date: 09/08/2012    Years since quitting: 9.6   Smokeless tobacco: Never  Vaping Use   Vaping Use: Former  Start date: 09/08/2012   Quit date: 07/08/2018   Devices: e-cigarette,  e-blu  Substance and Sexual Activity   Alcohol use: Not Currently    Alcohol/week: 2.0 standard drinks of alcohol    Types: 2 Glasses of wine per week   Drug use: Never   Sexual activity: Not on file  Other Topics Concern   Not on file  Social History Narrative   Regular exercise-yes   Caffeine Use-yes   Social Determinants of Health   Financial Resource Strain: Low Risk  (02/12/2022)   Overall Financial Resource Strain (CARDIA)    Difficulty of Paying Living Expenses: Not hard at all  Food Insecurity: No Food Insecurity (02/12/2022)   Hunger Vital Sign    Worried About Running Out of Food in the Last Year: Never true    Ran Out of Food in the Last Year: Never true  Transportation Needs: No Transportation Needs (02/12/2022)   PRAPARE - Hydrologist (Medical): No    Lack of Transportation (Non-Medical): No  Physical Activity: Inactive (02/12/2022)   Exercise Vital Sign    Days of Exercise per Week: 0 days    Minutes of Exercise per Session: 0 min  Stress: No Stress Concern Present (02/12/2022)   [place]    Feeling of Stress : Not at all  Social Connections: [place] (02/12/2022)   Social Connection and Isolation Panel [NHANES]    Frequency of Communication with Friends and Family: More than three times a week     Frequency of Social Gatherings with Friends and Family: Three times a week    Attends Religious Services: More than 4 times per year    Active Member of Clubs or Organizations: Yes    Attends Music therapist: More than 4 times per year    Marital Status: Married  Human resources officer Violence: Not on file     Allergies  Allergen Reactions   Amlodipine Swelling    edema   Tetracycline Rash     Outpatient Medications Prior to Visit  Medication Sig Dispense Refill   abiraterone acetate (ZYTIGA) 250 MG tablet Take 4 tablets (1,000 mg total) by mouth daily. 120 tablet 10   acetaminophen (TYLENOL) 325 MG tablet Take 325 mg by mouth daily as needed for fever.     albuterol (PROVENTIL) (2.5 MG/3ML) 0.083% nebulizer solution NEBULIZE 1 VIAL EVERY 6-8 HOURS AS NEEDED FOR WHEEZING OR SHORTNESS OF BREATH 360 mL 1   albuterol (VENTOLIN HFA) 108 (90 Base) MCG/ACT inhaler Inhale 2 puffs into the lungs every 6 (six) hours as needed for wheezing or shortness of breath. 18 g 3   azelastine (ASTELIN) 0.1 % nasal spray Place 2 puffs into the nose in the morning and at bedtime. 90 mL 1   CALCIUM-VITAMIN D PO Take 1 tablet by mouth 2 (two) times daily.      carvedilol (COREG) 3.125 MG tablet TAKE 1 TABLET 2 TIMES A DAY WITH BREAKFAST & SUPPER 180 tablet 1   denosumab (PROLIA) 60 MG/ML SOSY injection Inject 60 mg into the skin every 6 (six) months.     esomeprazole (NEXIUM) 20 MG capsule Take 20 mg by mouth daily at 12 noon.     fluticasone (FLONASE) 50 MCG/ACT nasal spray Place 2 sprays into both nostrils daily. 48 g 1   furosemide (LASIX) 20 MG tablet TAKE ONE TABLET TWICE DAILY 180 tablet 1   leuprolide (LUPRON) 11.25 MG  injection Inject 11.25 mg into the muscle every 3 (three) months.     Leuprolide Acetate (LUPRON IJ) Inject 1 Dose as directed every 4 (four) months. This is administered by [facility]     Multiple Vitamins-Minerals (CENTRUM SILVER 50+MEN PO) Take 1 tablet by mouth  daily.     NARCAN 4 MG/0.1ML LIQD nasal spray kit Place 0.4 mg into the nose once as needed (overdose).      olmesartan (BENICAR) 40 MG tablet Take 1 tablet (40 mg total) by mouth daily. 90 tablet 1   oxyCODONE (OXY IR/ROXICODONE) 5 MG immediate release tablet Take 1 tablet (5 mg total) by mouth every 4 (four) hours as needed for severe pain. 180 tablet 0   polyethylene glycol powder (GLYCOLAX/MIRALAX) 17 GM/SCOOP powder Take 17 g (1 capful) by mouth daily as directed. 510 g 6   predniSONE (DELTASONE) 5 MG tablet TAKE 1 TABLET 2 TIMES A DAY 90 tablet 3   sodium chloride 1 g tablet Take 1 g by mouth 2 (two) times daily with a meal.     solifenacin (VESICARE) 5 MG tablet Take 5 mg by mouth daily.     Specialty Vitamins Products (VISTA ADVANCED CAROTENOID) CAPS Take 1 capsule by mouth every evening.     TRELEGY ELLIPTA 100-62.5-25 MCG/ACT AEPB INHALE 1 PUFF INTO LUNGS DAILY 120 each 1   vitamin C (ASCORBIC ACID) 500 MG tablet Take 500 mg by mouth every evening.     vitamin E 180 MG (400 UNITS) capsule Take 400 Units by mouth every evening.     No facility-administered medications prior to visit.    Review of Systems  Constitutional:  Positive for malaise/fatigue. Negative for chills, fever and weight loss.  HENT:  Negative for hearing loss, sore throat and tinnitus.   Eyes:  Negative for blurred vision and double vision.  Respiratory:  Positive for cough. Negative for hemoptysis, sputum production, shortness of breath, wheezing and stridor.   Cardiovascular:  Negative for chest pain, palpitations, orthopnea, leg swelling and PND.  Gastrointestinal:  Negative for abdominal pain, constipation, diarrhea, heartburn, nausea and vomiting.  Genitourinary:  Negative for dysuria, hematuria and urgency.  Musculoskeletal:  Negative for joint pain and myalgias.  Skin:  Negative for itching and rash.  Neurological:  Negative for dizziness, tingling, weakness and headaches.  Endo/Heme/Allergies:  Negative  for environmental allergies. Does not bruise/bleed easily.  Psychiatric/Behavioral:  Negative for depression. The patient is not nervous/anxious and does not have insomnia.   All other systems reviewed and are negative.    Objective:  Physical Exam Vitals reviewed.  Constitutional:      General: He is not in acute distress.    Appearance: He is well-developed. He is obese.  HENT:     Head: Normocephalic and atraumatic.  Eyes:     General: No scleral icterus.    Conjunctiva/sclera: Conjunctivae normal.     Pupils: Pupils are equal, round, and reactive to light.  Neck:     Vascular: No JVD.     Trachea: No tracheal deviation.  Cardiovascular:     Rate and Rhythm: Normal rate and regular rhythm.     Heart sounds: Normal heart sounds. No murmur heard. Pulmonary:     Effort: Pulmonary effort is normal. No tachypnea, accessory muscle usage or respiratory distress.     Breath sounds: No stridor. No wheezing, rhonchi or rales.  Abdominal:     General: There is no distension.     Palpations: Abdomen is soft.  Tenderness: There is no abdominal tenderness.  Musculoskeletal:        General: No tenderness.     Cervical back: Neck supple.  Lymphadenopathy:     Cervical: No cervical adenopathy.  Skin:    General: Skin is warm and dry.     Capillary Refill: Capillary refill takes less than 2 seconds.     Findings: No rash.  Neurological:     Mental Status: He is alert and oriented to person, place, and time.  Psychiatric:        Behavior: Behavior normal.      Vitals:   05/11/22 1532  BP: 112/82  Pulse: 89  Temp: 98 F (36.7 C)  SpO2: 98%  Weight: 222 lb (100.7 kg)  Height: _0  (1.727 m)   98% on RA BMI Readings from Last 3 Encounters:  05/11/22 33.75 kg/m  04/14/22 33.30 kg/m  03/26/22 33.15 kg/m   Wt Readings from Last 3 Encounters:  05/11/22 222 lb (100.7 kg)  04/14/22 219 lb (99.3 kg)  03/26/22 218 lb (98.9 kg)     CBC    Component Value Date/Time    WBC 24.7 (H) 03/26/2022 1714   RBC 4.46 03/26/2022 1714   HGB 13.0 03/26/2022 1714   HGB 12.6 (L) 02/10/2022 1322   HCT 38.8 (L) 03/26/2022 1714   PLT 270 03/26/2022 1714   PLT 275 02/10/2022 1322   MCV 87.0 03/26/2022 1714   MCH 29.1 03/26/2022 1714   MCHC 33.5 03/26/2022 1714   RDW 13.6 03/26/2022 1714   LYMPHSABS 1.2 03/26/2022 1714   MONOABS 0.8 03/26/2022 1714   EOSABS 0.0 03/26/2022 1714   BASOSABS 0.1 03/26/2022 1714    Chest Imaging: 08/09/2019 CT chest: Patient with right middle lobe nodular opacity underlying malignancy not excluded.  Likely inflammatory related.  Possible community-acquired pneumonia. The patient's images have been independently reviewed by me.    04/14/2022 CXR:  Resolved bilateral basilar pneumonia  The patient's images have been independently reviewed by me.    Pulmonary Functions Testing Results:    Latest Ref Rng & Units 11/08/2019    1:49 PM 07/25/2013    8:49 AM  PFT Results  FVC-Pre L 3.02  4.60  P  FVC-Predicted Pre % 69  101  P  FVC-Post L 3.07  4.61  P  FVC-Predicted Post % 70  101  P  Pre FEV1/FVC % % 75  70  P  Post FEV1/FCV % % 77  72  P  FEV1-Pre L 2.27  3.21  P  FEV1-Predicted Pre % 69  93  P  FEV1-Post L 2.35  3.31  P  DLCO uncorrected ml/min/mmHg 20.52  33.22  P  DLCO UNC% % 80  109  P  DLCO corrected ml/min/mmHg 20.52    DLCO COR %Predicted % 80    DLVA Predicted % 90  94  P  TLC L 6.52  6.29  P  TLC % Predicted % 97  94  P  RV % Predicted % 145  73  P    P Preliminary result       Assessment & Plan:     ICD-10-CM   1. Asthma-COPD overlap syndrome  J44.89     2. Mild intermittent asthma without complication  N47.09     3. BMI 34.0-34.9,adult  Z68.34       Discussion:  This is a 66 year old gentleman right middle lobe nodular opacity initially diagnosed with community-acquired pneumonia after hospitalization.  He is  a former smoker.  He had follow-up CT imaging with resolution.  We now follow him for  asthma COPD overlap syndrome.  He has obesity as well.  Prostate cancer currently managed by medical oncology and urology.  Plan: Continue Trelegy as he been doing well on this for some time. Continue albuterol as needed. Continue PPI and nightly Pepcid. Follow-up with oncology planned. Follow-up with gastroenterology planned.    Current Outpatient Medications:    abiraterone acetate (ZYTIGA) 250 MG tablet, Take 4 tablets (1,000 mg total) by mouth daily., Disp: 120 tablet, Rfl: 10   acetaminophen (TYLENOL) 325 MG tablet, Take 325 mg by mouth daily as needed for fever., Disp: , Rfl:    albuterol (PROVENTIL) (2.5 MG/3ML) 0.083% nebulizer solution, NEBULIZE 1 VIAL EVERY 6-8 HOURS AS NEEDED FOR WHEEZING OR SHORTNESS OF BREATH, Disp: 360 mL, Rfl: 1   albuterol (VENTOLIN HFA) 108 (90 Base) MCG/ACT inhaler, Inhale 2 puffs into the lungs every 6 (six) hours as needed for wheezing or shortness of breath., Disp: 18 g, Rfl: 3   azelastine (ASTELIN) 0.1 % nasal spray, Place 2 puffs into the nose in the morning and at bedtime., Disp: 90 mL, Rfl: 1   CALCIUM-VITAMIN D PO, Take 1 tablet by mouth 2 (two) times daily. , Disp: , Rfl:    carvedilol (COREG) 3.125 MG tablet, TAKE 1 TABLET 2 TIMES A DAY WITH BREAKFAST & SUPPER, Disp: 180 tablet, Rfl: 1   denosumab (PROLIA) 60 MG/ML SOSY injection, Inject 60 mg into the skin every 6 (six) months., Disp: , Rfl:    esomeprazole (NEXIUM) 20 MG capsule, Take 20 mg by mouth daily at 12 noon., Disp: , Rfl:    fluticasone (FLONASE) 50 MCG/ACT nasal spray, Place 2 sprays into both nostrils daily., Disp: 48 g, Rfl: 1   furosemide (LASIX) 20 MG tablet, TAKE ONE TABLET TWICE DAILY, Disp: 180 tablet, Rfl: 1   leuprolide (LUPRON) 11.25 MG injection, Inject 11.25 mg into the muscle every 3 (three) months., Disp: , Rfl:    Leuprolide Acetate (LUPRON IJ), Inject 1 Dose as directed every 4 (four) months. This is administered by [facility], Disp: , Rfl:    Multiple  Vitamins-Minerals (CENTRUM SILVER 50+MEN PO), Take 1 tablet by mouth daily., Disp: , Rfl:    NARCAN 4 MG/0.1ML LIQD nasal spray kit, Place 0.4 mg into the nose once as needed (overdose). , Disp: , Rfl:    olmesartan (BENICAR) 40 MG tablet, Take 1 tablet (40 mg total) by mouth daily., Disp: 90 tablet, Rfl: 1   oxyCODONE (OXY IR/ROXICODONE) 5 MG immediate release tablet, Take 1 tablet (5 mg total) by mouth every 4 (four) hours as needed for severe pain., Disp: 180 tablet, Rfl: 0   polyethylene glycol powder (GLYCOLAX/MIRALAX) 17 GM/SCOOP powder, Take 17 g (1 capful) by mouth daily as directed., Disp: 510 g, Rfl: 6   predniSONE (DELTASONE) 5 MG tablet, TAKE 1 TABLET 2 TIMES A DAY, Disp: 90 tablet, Rfl: 3   sodium chloride 1 g tablet, Take 1 g by mouth 2 (two) times daily with a meal., Disp: , Rfl:    solifenacin (VESICARE) 5 MG tablet, Take 5 mg by mouth daily., Disp: , Rfl:    Specialty Vitamins Products (VISTA ADVANCED CAROTENOID) CAPS, Take 1 capsule by mouth every evening., Disp: , Rfl:    TRELEGY ELLIPTA 100-62.5-25 MCG/ACT AEPB, INHALE 1 PUFF INTO LUNGS DAILY, Disp: 120 each, Rfl: 1   vitamin C (ASCORBIC ACID) 500 MG tablet, Take 500  mg by mouth every evening., Disp: , Rfl:    vitamin E 180 MG (400 UNITS) capsule, Take 400 Units by mouth every evening., Disp: , Rfl:    Garner Nash, DO [facility] 05/11/2022 4:02 PM

## 2022-05-11 NOTE — Patient Instructions (Signed)
Thank you for visiting Dr. Valeta Harms at [facility]. Today we recommend the following:  Meds ordered this encounter  Medications   albuterol (PROVENTIL) (2.5 MG/3ML) 0.083% nebulizer solution    Sig: Take 3 mLs (2.5 mg total) by nebulization every 6 (six) hours as needed for wheezing or shortness of breath.    Dispense:  360 mL    Refill:  11    Dx: J44.9   albuterol (VENTOLIN HFA) 108 (90 Base) MCG/ACT inhaler    Sig: Inhale 2 puffs into the lungs every 6 (six) hours as needed for wheezing or shortness of breath.    Dispense:  18 g    Refill:  3   Fluticasone-Umeclidin-Vilant (TRELEGY ELLIPTA) 100-62.5-25 MCG/ACT AEPB    Sig: Inhale 1 puff into the lungs daily.    Dispense:  120 each    Refill:  3   Return in about 1 year (around 05/12/2023) for with APP or Dr. Valeta Harms.    Please do your part to reduce the spread of COVID-19.

## 2022-05-12 ENCOUNTER — Inpatient Hospital Stay: Payer: Medicare Other

## 2022-05-12 ENCOUNTER — Inpatient Hospital Stay: Payer: Medicare Other | Admitting: Oncology

## 2022-05-12 ENCOUNTER — Inpatient Hospital Stay: Payer: Medicare Other | Attending: Oncology

## 2022-05-12 ENCOUNTER — Other Ambulatory Visit: Payer: Self-pay

## 2022-05-12 VITALS — BP 138/80 | HR 74 | Temp 97.7°F | Resp 18 | Ht 68.0 in

## 2022-05-12 DIAGNOSIS — Z8546 Personal history of malignant neoplasm of prostate: Secondary | ICD-10-CM

## 2022-05-12 DIAGNOSIS — C7952 Secondary malignant neoplasm of bone marrow: Secondary | ICD-10-CM

## 2022-05-12 DIAGNOSIS — Z5111 Encounter for antineoplastic chemotherapy: Secondary | ICD-10-CM | POA: Diagnosis not present

## 2022-05-12 DIAGNOSIS — C61 Malignant neoplasm of prostate: Secondary | ICD-10-CM | POA: Insufficient documentation

## 2022-05-12 DIAGNOSIS — Z79899 Other long term (current) drug therapy: Secondary | ICD-10-CM | POA: Insufficient documentation

## 2022-05-12 DIAGNOSIS — C7951 Secondary malignant neoplasm of bone: Secondary | ICD-10-CM

## 2022-05-12 LAB — CMP (CANCER CENTER ONLY)
ALT: 17 U/L (ref 0–44)
AST: 12 U/L — ABNORMAL LOW (ref 15–41)
Albumin: 4 g/dL (ref 3.5–5.0)
Alkaline Phosphatase: 50 U/L (ref 38–126)
Anion gap: 7 (ref 5–15)
BUN: 18 mg/dL (ref 8–23)
CO2: 29 mmol/L (ref 22–32)
Calcium: 9.3 mg/dL (ref 8.9–10.3)
Chloride: 102 mmol/L (ref 98–111)
Creatinine: 0.88 mg/dL (ref 0.61–1.24)
GFR, Estimated: >60 mL/min (ref >60)
Glucose, Bld: 121 mg/dL — ABNORMAL HIGH (ref 70–99)
Potassium: 4.1 mmol/L (ref 3.5–5.1)
Sodium: 138 mmol/L (ref 135–145)
Total Bilirubin: 0.5 mg/dL (ref 0.3–1.2)
Total Protein: 6.5 g/dL (ref 6.5–8.1)

## 2022-05-12 LAB — CBC WITH DIFFERENTIAL (CANCER CENTER ONLY)
Abs Immature Granulocytes: 0.06 10*3/uL (ref 0.00–0.07)
Basophils Absolute: 0 10*3/uL (ref 0.0–0.1)
Basophils Relative: 0 %
Eosinophils Absolute: 0.2 10*3/uL (ref 0.0–0.5)
Eosinophils Relative: 2 %
HCT: 36.6 % — ABNORMAL LOW (ref 39.0–52.0)
Hemoglobin: 12.4 g/dL — ABNORMAL LOW (ref 13.0–17.0)
Immature Granulocytes: 1 %
Lymphocytes Relative: 21 %
Lymphs Abs: 2.1 10*3/uL (ref 0.7–4.0)
MCH: 29.7 pg (ref 26.0–34.0)
MCHC: 33.9 g/dL (ref 30.0–36.0)
MCV: 87.8 fL (ref 80.0–100.0)
Monocytes Absolute: 0.8 10*3/uL (ref 0.1–1.0)
Monocytes Relative: 8 %
Neutro Abs: 6.8 10*3/uL (ref 1.7–7.7)
Neutrophils Relative %: 68 %
Platelet Count: 275 10*3/uL (ref 150–400)
RBC: 4.17 MIL/uL — ABNORMAL LOW (ref 4.22–5.81)
RDW: 14.4 % (ref 11.5–15.5)
WBC Count: 10.1 10*3/uL (ref 4.0–10.5)
nRBC: 0 % (ref 0.0–0.2)

## 2022-05-12 LAB — PSA: PSA: 1.3

## 2022-05-12 MED ORDER — LEUPROLIDE ACETATE (3 MONTH) 22.5 MG ~~LOC~~ KIT
22.5000 mg | PACK | Freq: Once | SUBCUTANEOUS | Status: AC
  Administered 2022-05-12: 22.5 mg via SUBCUTANEOUS
  Filled 2022-05-12: qty 22.5

## 2022-05-12 NOTE — Progress Notes (Signed)
Hematology and Oncology Follow Up Visit  [name] Holder [id number] [birth year] 66 y.o. 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  He presented with localized disease in 2014.  Prior Therapy:  Androgen deprivation therapy in 2014.  He developed castration resistant in 2018.  He is status post T10, T12, kyphoplasty completed on January 22 of 2020.  Current therapy:  Zytiga 1000 mg daily with prednisone at 5 mg daily started in 2018.  Eligard 22.5 mg every 3 months.    Prolia 60 mg every 6 months.  Next injection will be given in January 2024.  He  Interim History: [name] Holder presents today for a follow-up visit.  Since last visit, he reports no major changes in his health.  He has reported issues with reflux and currently on Nexium and will have GI evaluation.  He denies any worse sending bone pain or or falls.  He continues to be on oxycodone which managing his pain.  He has tolerated Zytiga without any issues at this time.         Medications: Reviewed without changes. Current Outpatient Medications  Medication Sig Dispense Refill   abiraterone acetate (ZYTIGA) 250 MG tablet Take 4 tablets (1,000 mg total) by mouth daily. 120 tablet 10   acetaminophen (TYLENOL) 325 MG tablet Take 325 mg by mouth daily as needed for fever.     albuterol (PROVENTIL) (2.5 MG/3ML) 0.083% nebulizer solution Take 3 mLs (2.5 mg total) by nebulization every 6 (six) hours as needed for wheezing or shortness of breath. 360 mL 11   albuterol (VENTOLIN HFA) 108 (90 Base) MCG/ACT inhaler Inhale 2 puffs into the lungs every 6 (six) hours as needed for wheezing or shortness of breath. 18 g 3   azelastine (ASTELIN) 0.1 % nasal spray Place 2 puffs into the nose in the morning and at bedtime. 90 mL 1   CALCIUM-VITAMIN D PO Take 1 tablet by mouth 2 (two) times daily.       carvedilol (COREG) 3.125 MG tablet TAKE 1 TABLET 2 TIMES A DAY WITH BREAKFAST & SUPPER 180 tablet 1   denosumab (PROLIA) 60 MG/ML SOSY injection Inject 60 mg into the skin every 6 (six) months.     esomeprazole (NEXIUM) 20 MG capsule Take 20 mg by mouth daily at 12 noon.     fluticasone (FLONASE) 50 MCG/ACT nasal spray Place 2 sprays into both nostrils daily. 48 g 1   Fluticasone-Umeclidin-Vilant (TRELEGY ELLIPTA) 100-62.5-25 MCG/ACT AEPB Inhale 1 puff into the lungs daily. 120 each 3   furosemide (LASIX) 20 MG tablet TAKE ONE TABLET TWICE DAILY 180 tablet 1   leuprolide (LUPRON) 11.25 MG injection Inject 11.25 mg into the muscle every 3 (three) months.     Leuprolide Acetate (LUPRON IJ) Inject 1 Dose as directed every 4 (four) months. This is administered by [facility]     Multiple Vitamins-Minerals (CENTRUM SILVER 50+MEN PO) Take 1 tablet by mouth daily.     NARCAN 4 MG/0.1ML LIQD nasal spray kit Place 0.4 mg into the nose once as needed (overdose).      olmesartan (BENICAR) 40 MG tablet Take 1 tablet (40 mg total) by mouth daily. 90 tablet 1   oxyCODONE (OXY IR/ROXICODONE) 5 MG immediate release tablet Take 1 tablet (5 mg total) by mouth every 4 (four) hours as needed for severe pain. 180 tablet 0   polyethylene glycol  powder (GLYCOLAX/MIRALAX) 17 GM/SCOOP powder Take 17 g (1 capful) by mouth daily as directed. 510 g 6   predniSONE (DELTASONE) 5 MG tablet TAKE 1 TABLET 2 TIMES A DAY 90 tablet 3   sodium chloride 1 g tablet Take 1 g by mouth 2 (two) times daily with a meal.     solifenacin (VESICARE) 5 MG tablet Take 5 mg by mouth daily.     Specialty Vitamins Products (VISTA ADVANCED CAROTENOID) CAPS Take 1 capsule by mouth every evening.     vitamin C (ASCORBIC ACID) 500 MG tablet Take 500 mg by mouth every evening.     vitamin E 180 MG (400 UNITS) capsule Take 400 Units by mouth every evening.     No current facility-administered medications for this visit.     Allergies:   Allergies  Allergen Reactions   Amlodipine Swelling    edema   Tetracycline Rash      Physical Exam:     Blood pressure 138/80, pulse 74, temperature 97.7 F (36.5 C), temperature source Temporal, resp. rate 18, height _0  (1.727 m), SpO2 97 %.        ECOG: 1    General appearance: Comfortable appearing without any discomfort Head: Normocephalic without any trauma Oropharynx: Mucous membranes are moist and pink without any thrush or ulcers. Eyes: Pupils are equal and round reactive to light. Lymph nodes: No cervical, supraclavicular, inguinal or axillary lymphadenopathy.   Heart:regular rate and rhythm.  S1 and S2 without leg edema. Lung: Clear without any rhonchi or wheezes.  No dullness to percussion. Abdomin: Soft, nontender, nondistended with good bowel sounds.  No hepatosplenomegaly. Musculoskeletal: No joint deformity or effusion.  Full range of motion noted. Neurological: No deficits noted on motor, sensory and deep tendon reflex exam. Skin: No petechial rash or dryness.  Appeared moist.             Lab Results: Lab Results  Component Value Date   WBC 24.7 (H) 03/26/2022   HGB 13.0 03/26/2022   HCT 38.8 (L) 03/26/2022   MCV 87.0 03/26/2022   PLT 270 03/26/2022   PSA 0.7 07/30/2021     Chemistry      Component Value Date/Time   NA 134 (L) 03/26/2022 1714   K 3.8 03/26/2022 1714   CL 102 03/26/2022 1714   CO2 21 (L) 03/26/2022 1714   BUN 18 03/26/2022 1714   CREATININE 0.91 03/26/2022 1714   CREATININE 0.91 02/10/2022 1322      Component Value Date/Time   CALCIUM 8.9 03/26/2022 1714   ALKPHOS 48 03/26/2022 1714   AST 20 03/26/2022 1714   AST 12 (L) 02/10/2022 1322   ALT 29 03/26/2022 1714   ALT 15 02/10/2022 1322   BILITOT 0.9 03/26/2022 1714   BILITOT 0.3 02/10/2022 1322         Latest Reference Range & Units 07/30/21 14:46 11/07/21 13:26 02/10/22 13:22  Prostate Specific Ag, Serum 0.0 - 4.0 ng/mL 0.7 0.7 0.8        Impression and Plan:  66 year old man with:   1.    Advanced prostate cancer with lymphadenopathy diagnosed in 2018.  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  He is PSA continues to be under reasonable control although slowly rising up to 0.8.  Risks and benefits of continuing Zytiga were discussed at this time.  Alternative treatment options including systemic chemotherapy, PARP inhibitor.  [place] the appropriate mutation.  At this time, I recommended continued monitoring given the  slow rise in his PSA.  He is currently asymptomatic and willing to continue with the current treatment plan.   2.  Compression fracture tween T10 and T12: No evidence of pathological fractures or reexacerbation.    3.  Bone directed therapy: He is currently on Prolia which will be repeated in August 15, 2012.  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   4.  Pain: He is currently on oxycodone which she will continue as needed.   5.  Prognosis and goals of care: His disease is incurable although aggressive measures are warranted.  Any treatment is palliative however.      6.  Follow-up: He will return in 3 months for a follow-up.   30  minutes were dedicated to this encounter.  The time spent on reviewing laboratory data, disease status update and outlining future plan of care review.         Zola Button, MD 10/17/20238:46 AM

## 2022-05-13 ENCOUNTER — Telehealth: Payer: Self-pay | Admitting: *Deleted

## 2022-05-13 LAB — PROSTATE-SPECIFIC AG, SERUM (LABCORP): Prostate Specific Ag, Serum: 1.3 ng/mL (ref 0.0–4.0)

## 2022-05-13 NOTE — Telephone Encounter (Signed)
PC to patient, informed him his PSA is 1.3, no changes in tx at this time.  He verbalizes understanding.

## 2022-05-13 NOTE — Telephone Encounter (Signed)
-----   Message from Wyatt Portela, MD sent at 05/13/2022  8:54 AM EDT ----- Please let him know his PSA is up slightly. No changes for now like we discussed.

## 2022-05-20 ENCOUNTER — Other Ambulatory Visit: Payer: Self-pay | Admitting: *Deleted

## 2022-05-20 DIAGNOSIS — C7951 Secondary malignant neoplasm of bone: Secondary | ICD-10-CM

## 2022-05-20 MED ORDER — OXYCODONE HCL 5 MG PO TABS: 5.0000 mg | ORAL_TABLET | ORAL | 0 refills | Status: DC | PRN

## 2022-06-02 ENCOUNTER — Encounter: Payer: Self-pay | Admitting: Internal Medicine

## 2022-06-02 ENCOUNTER — Ambulatory Visit (INDEPENDENT_AMBULATORY_CARE_PROVIDER_SITE_OTHER): Payer: Medicare Other | Admitting: Internal Medicine

## 2022-06-02 VITALS — BP 142/84 | HR 76 | Temp 98.5°F | Resp 16 | Ht 68.0 in

## 2022-06-02 DIAGNOSIS — I10 Essential (primary) hypertension: Secondary | ICD-10-CM

## 2022-06-02 DIAGNOSIS — C61 Malignant neoplasm of prostate: Secondary | ICD-10-CM

## 2022-06-02 DIAGNOSIS — R42 Dizziness and giddiness: Secondary | ICD-10-CM | POA: Diagnosis not present

## 2022-06-02 MED ORDER — MECLIZINE HCL 25 MG PO TABS: 25.0000 mg | ORAL_TABLET | Freq: Three times a day (TID) | ORAL | 0 refills | Status: DC | PRN

## 2022-06-02 NOTE — Progress Notes (Signed)
Subjective:  Patient ID: [name] Holder, male    DOB: [birth year]  Age: 66 y.o. MRN: [id number]  CC: Hypertension   HPI [name] Holder presents for f/up -  He continues to complain of episodes of feeling swimmy headed.  He is taking meclizine with some improvement.  He has rare episodes of dizziness and lightheadedness.  His cough has improved and he is having no more episodes of aspiration.  Outpatient Medications Prior to Visit  Medication Sig Dispense Refill   abiraterone acetate (ZYTIGA) 250 MG tablet Take 4 tablets (1,000 mg total) by mouth daily. 120 tablet 10   acetaminophen (TYLENOL) 325 MG tablet Take 325 mg by mouth daily as needed for fever.     albuterol (PROVENTIL) (2.5 MG/3ML) 0.083% nebulizer solution Take 3 mLs (2.5 mg total) by nebulization every 6 (six) hours as needed for wheezing or shortness of breath. 360 mL 11   albuterol (VENTOLIN HFA) 108 (90 Base) MCG/ACT inhaler Inhale 2 puffs into the lungs every 6 (six) hours as needed for wheezing or shortness of breath. 18 g 3   azelastine (ASTELIN) 0.1 % nasal spray Place 2 puffs into the nose in the morning and at bedtime. 90 mL 1   CALCIUM-VITAMIN D PO Take 1 tablet by mouth 2 (two) times daily.      carvedilol (COREG) 3.125 MG tablet TAKE 1 TABLET 2 TIMES A DAY WITH BREAKFAST & SUPPER 180 tablet 1   denosumab (PROLIA) 60 MG/ML SOSY injection Inject 60 mg into the skin every 6 (six) months.     Famotidine (PEPCID AC PO) Take by mouth.     fluticasone (FLONASE) 50 MCG/ACT nasal spray Place 2 sprays into both nostrils daily. 48 g 1   Fluticasone-Umeclidin-Vilant (TRELEGY ELLIPTA) 100-62.5-25 MCG/ACT AEPB Inhale 1 puff into the lungs daily. 120 each 3   furosemide (LASIX) 20 MG tablet TAKE ONE TABLET TWICE DAILY 180 tablet 1   leuprolide (LUPRON) 11.25 MG injection Inject 11.25 mg into the muscle every 3 (three) months.     Leuprolide Acetate (LUPRON IJ) Inject 1 Dose as directed every 4 (four) months. This is  administered by [facility]     Multiple Vitamins-Minerals (CENTRUM SILVER 50+MEN PO) Take 1 tablet by mouth daily.     NARCAN 4 MG/0.1ML LIQD nasal spray kit Place 0.4 mg into the nose once as needed (overdose).      olmesartan (BENICAR) 40 MG tablet Take 1 tablet (40 mg total) by mouth daily. 90 tablet 1   oxyCODONE (OXY IR/ROXICODONE) 5 MG immediate release tablet Take 1 tablet (5 mg total) by mouth every 4 (four) hours as needed for severe pain. 180 tablet 0   polyethylene glycol powder (GLYCOLAX/MIRALAX) 17 GM/SCOOP powder Take 17 g (1 capful) by mouth daily as directed. 510 g 6   sodium chloride 1 g tablet Take 1 g by mouth 2 (two) times daily with a meal.     solifenacin (VESICARE) 5 MG tablet Take 5 mg by mouth daily.     Specialty Vitamins Products (VISTA ADVANCED CAROTENOID) CAPS Take 1 capsule by mouth every evening.     vitamin C (ASCORBIC ACID) 500 MG tablet Take 500 mg by mouth every evening.     vitamin E 180 MG (400 UNITS) capsule Take 400 Units by mouth every evening.     esomeprazole (NEXIUM) 20 MG capsule Take 20 mg by mouth daily at 12 noon.     predniSONE (DELTASONE) 5 MG tablet TAKE 1  TABLET 2 TIMES A DAY 90 tablet 3   No facility-administered medications prior to visit.    ROS Review of Systems  Constitutional: Negative.  Negative for diaphoresis and fatigue.  HENT: Negative.    Eyes: Negative.   Respiratory:  Positive for shortness of breath. Negative for cough and wheezing.   Cardiovascular:  Negative for chest pain, palpitations and leg swelling.  Gastrointestinal:  Negative for abdominal pain, diarrhea, nausea and vomiting.  Endocrine: Negative.   Genitourinary: Negative.  Negative for difficulty urinating.  Musculoskeletal: Negative.  Negative for arthralgias and myalgias.  Skin: Negative.   Neurological:  Positive for dizziness and light-headedness. Negative for weakness.  Hematological:  Negative for adenopathy. Does not bruise/bleed easily.   Psychiatric/Behavioral: Negative.      Objective:  BP (!) 142/84 (BP Location: Left Arm, Patient Position: Sitting, Cuff Size: Large)   Pulse 76   Temp 98.5 F (36.9 C) (Oral)   Resp 16   Ht _0  (1.727 m)   SpO2 98%   BMI 33.75 kg/m   BP Readings from Last 3 Encounters:  06/02/22 (!) 142/84  05/12/22 138/80  05/11/22 112/82    Wt Readings from Last 3 Encounters:  05/11/22 222 lb (100.7 kg)  04/14/22 219 lb (99.3 kg)  03/26/22 218 lb (98.9 kg)    Physical Exam Vitals reviewed.  HENT:     Nose: Nose normal.     Mouth/Throat:     Mouth: Mucous membranes are moist.  Eyes:     General: No scleral icterus. Cardiovascular:     Rate and Rhythm: Normal rate and regular rhythm.     Heart sounds: No murmur heard. Pulmonary:     Effort: Pulmonary effort is normal.     Breath sounds: No stridor. No wheezing, rhonchi or rales.  Abdominal:     General: Abdomen is protuberant. Bowel sounds are normal. There is no distension.     Palpations: Abdomen is soft. There is no hepatomegaly.     Tenderness: There is no abdominal tenderness.  Musculoskeletal:        General: Normal range of motion.     Cervical back: Neck supple.     Right lower leg: No edema.     Left lower leg: No edema.  Lymphadenopathy:     Cervical: No cervical adenopathy.  Skin:    General: Skin is warm and dry.  Neurological:     General: No focal deficit present.     Mental Status: He is alert.     Lab Results  Component Value Date   WBC 10.1 05/12/2022   HGB 12.4 (L) 05/12/2022   HCT 36.6 (L) 05/12/2022   PLT 275 05/12/2022   GLUCOSE 121 (H) 05/12/2022   CHOL 173 10/28/2021   TRIG 167.0 (H) 10/28/2021   HDL 38.10 (L) 10/28/2021   LDLCALC 101 (H) 10/28/2021   ALT 17 05/12/2022   AST 12 (L) 05/12/2022   NA 138 05/12/2022   K 4.1 05/12/2022   CL 102 05/12/2022   CREATININE 0.88 05/12/2022   BUN 18 05/12/2022   CO2 29 05/12/2022   TSH 0.86 09/30/2020   PSA 1.3 05/12/2022   INR 1.1  09/17/2021   HGBA1C 5.7 10/28/2021    DG Chest 2 View  Result Date: 03/26/2022 CLINICAL DATA:  Pneumonia. EXAM: CHEST - 2 VIEW COMPARISON:  Chest radiograph August 08/25/2021. FINDINGS: The heart size and mediastinal contours are unchanged. Similar appearance of the right-greater-than-left bibasilar airspace opacities seen on same-day radiograph  at 1042 hours. No pleural effusion or pneumothorax. No significant interval change in the chronic thoracic compression fractures with augmentation at some levels. IMPRESSION: Multifocal pneumonia not significantly changed recommend radiographic follow-up to resolution. Electronically Signed   By: Dahlia Bailiff M.D.   On: 03/26/2022 18:05   DG Chest 2 View  Result Date: 04/14/2022 CLINICAL DATA:  Pneumonia follow-up. EXAM: CHEST - 2 VIEW COMPARISON:  Chest x-ray dated March 26, 2022. FINDINGS: The heart size and mediastinal contours are within normal limits. Normal pulmonary vascularity. Resolved bibasilar pneumonia with residual minimal left basilar atelectasis. Unchanged mild scarring in the lingula and right middle lobe. No pleural effusion or pneumothorax. No acute osseous abnormality. Multiple chronic thoracolumbar compression deformities again noted. IMPRESSION: 1. Resolved bibasilar pneumonia. Electronically Signed   By: Titus Dubin M.D.   On: 04/14/2022 11:59   DG Chest 2 View  Result Date: 03/26/2022 CLINICAL DATA:  Pneumonia. EXAM: CHEST - 2 VIEW COMPARISON:  Chest radiograph August 08/25/2021. FINDINGS: The heart size and mediastinal contours are unchanged. Similar appearance of the right-greater-than-left bibasilar airspace opacities seen on same-day radiograph at 1042 hours. No pleural effusion or pneumothorax. No significant interval change in the chronic thoracic compression fractures with augmentation at some levels. IMPRESSION: Multifocal pneumonia not significantly changed recommend radiographic follow-up to resolution. Electronically  Signed   By: Dahlia Bailiff M.D.   On: 03/26/2022 18:05     Assessment & Plan:   Yolanda was seen today for hypertension.  Diagnoses and all orders for this visit:  Essential hypertension- His blood pressure is adequately well controlled.  Prostate cancer [facility])- His recent PSA was normal at 1.3.  Vertigo- Will restart meclizine. -     meclizine (ANTIVERT) 25 MG tablet; Take 1 tablet (25 mg total) by mouth 3 (three) times daily as needed for dizziness.   I have discontinued Savvas Roper. Olguin's esomeprazole. I am also having him start on meclizine. Additionally, I am having him maintain his CALCIUM-VITAMIN D PO, Leuprolide Acetate (LUPRON IJ), Narcan, acetaminophen, Multiple Vitamins-Minerals (CENTRUM SILVER 50+MEN PO), solifenacin, sodium chloride, denosumab, polyethylene glycol powder, ascorbic acid, vitamin E, [place] Advanced Carotenoid, abiraterone acetate, azelastine, olmesartan, fluticasone, furosemide, carvedilol, leuprolide, albuterol, albuterol, Trelegy Ellipta, oxyCODONE, and Famotidine (PEPCID AC PO).  Meds ordered this encounter  Medications   meclizine (ANTIVERT) 25 MG tablet    Sig: Take 1 tablet (25 mg total) by mouth 3 (three) times daily as needed for dizziness.    Dispense:  270 tablet    Refill:  0     Follow-up: Return in about 3 months (around 09/02/2022).  Scarlette Calico, MD

## 2022-06-02 NOTE — Patient Instructions (Signed)
Vertigo Vertigo is the feeling that you or your surroundings are moving when they are not. This feeling can come and go at any time. Vertigo often goes away on its own. Vertigo can be dangerous if it occurs while you are doing something that could endanger yourself or others, such as driving or operating machinery. Your health care provider will do tests to try to determine the cause of your vertigo. Tests will also help your health care provider decide how best to treat your condition. Follow these instructions at home: Eating and drinking     Dehydration can make vertigo worse. Drink enough fluid to keep your urine pale yellow. Do not drink alcohol. Activity Return to your normal activities as told by your health care provider. Ask your health care provider what activities are safe for you. In the morning, first sit up on the side of the bed. When you feel okay, stand slowly while you hold onto something until you know that your balance is fine. Move slowly. Avoid sudden body or head movements or certain positions, as told by your health care provider. If you have trouble walking or keeping your balance, try using a cane for stability. If you feel dizzy or unstable, sit down right away. Avoid doing any tasks that would cause danger to you or others if vertigo occurs. Avoid bending down if you feel dizzy. Place items in your home so that they are easy for you to reach without bending or leaning over. Do not drive or use machinery if you feel dizzy. General instructions Take over-the-counter and prescription medicines only as told by your health care provider. Keep all follow-up visits. This is important. Contact a health care provider if: Your medicines do not relieve your vertigo or they make it worse. Your condition gets worse or you develop new symptoms. You have a fever. You develop nausea or vomiting, or if nausea gets worse. Your family or friends notice any behavioral changes. You  have numbness or a prickling and tingling sensation in part of your body. Get help right away if you: Are always dizzy or you faint. Develop severe headaches. Develop a stiff neck. Develop sensitivity to light. Have difficulty moving or speaking. Have weakness in your hands, arms, or legs. Have changes in your hearing or vision. These symptoms may represent a serious problem that is an emergency. Do not wait to see if the symptoms will go away. Get medical help right away. Call your local emergency services (911 in the [place] Do not drive yourself to the hospital. Summary Vertigo is the feeling that you or your surroundings are moving when they are not. Your health care provider will do tests to try to determine the cause of your vertigo. Follow instructions for home care. You may be told to avoid certain tasks, positions, or movements. Contact a health care provider if your medicines do not relieve your symptoms, or if you have a fever, nausea, vomiting, or changes in behavior. Get help right away if you have severe headaches or difficulty speaking, or you develop hearing or vision problems. This information is not intended to replace advice given to you by your health care provider. Make sure you discuss any questions you have with your health care provider. Document Revised: 06/12/2020 Document Reviewed: 06/12/2020 Elsevier Patient Education  2023 [employer]  

## 2022-06-03 ENCOUNTER — Other Ambulatory Visit: Payer: Self-pay | Admitting: Hematology

## 2022-06-03 DIAGNOSIS — Z8546 Personal history of malignant neoplasm of prostate: Secondary | ICD-10-CM

## 2022-06-22 ENCOUNTER — Other Ambulatory Visit: Payer: Self-pay | Admitting: Oncology

## 2022-06-22 ENCOUNTER — Other Ambulatory Visit: Payer: Self-pay

## 2022-06-22 DIAGNOSIS — C7951 Secondary malignant neoplasm of bone: Secondary | ICD-10-CM

## 2022-06-22 MED ORDER — OXYCODONE HCL 5 MG PO TABS: 5.0000 mg | ORAL_TABLET | ORAL | 0 refills | Status: DC | PRN

## 2022-06-30 ENCOUNTER — Other Ambulatory Visit: Payer: Self-pay | Admitting: Internal Medicine

## 2022-06-30 DIAGNOSIS — I10 Essential (primary) hypertension: Secondary | ICD-10-CM

## 2022-07-22 ENCOUNTER — Telehealth: Payer: Self-pay | Admitting: Oncology

## 2022-07-22 NOTE — Telephone Encounter (Signed)
Rescheduled 01/16 appointment time per providers on-call. Patient has been called and notified.

## 2022-07-23 ENCOUNTER — Telehealth: Payer: Self-pay | Admitting: *Deleted

## 2022-07-23 ENCOUNTER — Other Ambulatory Visit: Payer: Self-pay | Admitting: Oncology

## 2022-07-23 DIAGNOSIS — C7951 Secondary malignant neoplasm of bone: Secondary | ICD-10-CM

## 2022-07-23 MED ORDER — OXYCODONE HCL 5 MG PO TABS: 5.0000 mg | ORAL_TABLET | ORAL | 0 refills | Status: DC | PRN

## 2022-07-23 NOTE — Telephone Encounter (Signed)
Pt called requesting a refill of Oxycodone 5 mg to [facility] in [place]

## 2022-08-04 ENCOUNTER — Encounter: Payer: Self-pay | Admitting: Oncology

## 2022-08-11 ENCOUNTER — Ambulatory Visit: Payer: Medicare Other | Admitting: Oncology

## 2022-08-11 ENCOUNTER — Ambulatory Visit: Payer: Medicare Other

## 2022-08-11 ENCOUNTER — Inpatient Hospital Stay: Payer: Medicare Other | Attending: Hematology

## 2022-08-11 ENCOUNTER — Inpatient Hospital Stay (HOSPITAL_BASED_OUTPATIENT_CLINIC_OR_DEPARTMENT_OTHER): Payer: Medicare Other | Admitting: Oncology

## 2022-08-11 ENCOUNTER — Other Ambulatory Visit: Payer: Medicare Other

## 2022-08-11 ENCOUNTER — Inpatient Hospital Stay: Payer: Medicare Other

## 2022-08-11 VITALS — BP 143/88 | HR 87 | Temp 97.6°F | Resp 18 | Ht 68.0 in | Wt 226.2 lb

## 2022-08-11 DIAGNOSIS — C7951 Secondary malignant neoplasm of bone: Secondary | ICD-10-CM | POA: Diagnosis not present

## 2022-08-11 DIAGNOSIS — Z8546 Personal history of malignant neoplasm of prostate: Secondary | ICD-10-CM

## 2022-08-11 DIAGNOSIS — M4854XA Collapsed vertebra, not elsewhere classified, thoracic region, initial encounter for fracture: Secondary | ICD-10-CM | POA: Diagnosis not present

## 2022-08-11 DIAGNOSIS — C61 Malignant neoplasm of prostate: Secondary | ICD-10-CM | POA: Diagnosis not present

## 2022-08-11 DIAGNOSIS — Z5111 Encounter for antineoplastic chemotherapy: Secondary | ICD-10-CM | POA: Diagnosis not present

## 2022-08-11 DIAGNOSIS — C7952 Secondary malignant neoplasm of bone marrow: Secondary | ICD-10-CM | POA: Diagnosis not present

## 2022-08-11 LAB — CBC WITH DIFFERENTIAL (CANCER CENTER ONLY)
Abs Immature Granulocytes: 0.03 10*3/uL (ref 0.00–0.07)
Basophils Absolute: 0 10*3/uL (ref 0.0–0.1)
Basophils Relative: 0 %
Eosinophils Absolute: 0.2 10*3/uL (ref 0.0–0.5)
Eosinophils Relative: 2 %
HCT: 36.5 % — ABNORMAL LOW (ref 39.0–52.0)
Hemoglobin: 12.6 g/dL — ABNORMAL LOW (ref 13.0–17.0)
Immature Granulocytes: 0 %
Lymphocytes Relative: 22 %
Lymphs Abs: 2.2 10*3/uL (ref 0.7–4.0)
MCH: 30.1 pg (ref 26.0–34.0)
MCHC: 34.5 g/dL (ref 30.0–36.0)
MCV: 87.3 fL (ref 80.0–100.0)
Monocytes Absolute: 0.9 10*3/uL (ref 0.1–1.0)
Monocytes Relative: 8 %
Neutro Abs: 7.1 10*3/uL (ref 1.7–7.7)
Neutrophils Relative %: 68 %
Platelet Count: 282 10*3/uL (ref 150–400)
RBC: 4.18 MIL/uL — ABNORMAL LOW (ref 4.22–5.81)
RDW: 13.2 % (ref 11.5–15.5)
WBC Count: 10.4 10*3/uL (ref 4.0–10.5)
nRBC: 0 % (ref 0.0–0.2)

## 2022-08-11 LAB — CMP (CANCER CENTER ONLY)
ALT: 15 U/L (ref 0–44)
AST: 12 U/L — ABNORMAL LOW (ref 15–41)
Albumin: 3.9 g/dL (ref 3.5–5.0)
Alkaline Phosphatase: 48 U/L (ref 38–126)
Anion gap: 5 (ref 5–15)
BUN: 16 mg/dL (ref 8–23)
CO2: 28 mmol/L (ref 22–32)
Calcium: 9.6 mg/dL (ref 8.9–10.3)
Chloride: 103 mmol/L (ref 98–111)
Creatinine: 0.84 mg/dL (ref 0.61–1.24)
GFR, Estimated: >60 mL/min (ref >60)
Glucose, Bld: 102 mg/dL — ABNORMAL HIGH (ref 70–99)
Potassium: 4.3 mmol/L (ref 3.5–5.1)
Sodium: 136 mmol/L (ref 135–145)
Total Bilirubin: 0.4 mg/dL (ref 0.3–1.2)
Total Protein: 6.5 g/dL (ref 6.5–8.1)

## 2022-08-11 MED ORDER — DENOSUMAB 60 MG/ML ~~LOC~~ SOSY
60.0000 mg | PREFILLED_SYRINGE | Freq: Once | SUBCUTANEOUS | Status: AC
  Administered 2022-08-11: 60 mg via SUBCUTANEOUS
  Filled 2022-08-11: qty 1

## 2022-08-11 MED ORDER — LEUPROLIDE ACETATE (3 MONTH) 22.5 MG ~~LOC~~ KIT
22.5000 mg | PACK | Freq: Once | SUBCUTANEOUS | Status: AC
  Administered 2022-08-11: 22.5 mg via SUBCUTANEOUS
  Filled 2022-08-11: qty 22.5

## 2022-08-11 NOTE — Progress Notes (Signed)
Hematology and Oncology Follow Up Visit  [name] Holder [id number] 05-Jul-1956 67 y.o. 08/11/2022 12:00 PM [name] Holder, MDJones, [name], MD   Principle Diagnosis: 67 year old man with advanced prostate cancer with lymphadenopathy diagnosed in 2018.  He has castration-resistant after presenting in 2014 with lymphadenopathy, Gleason score 7 and PSA of 23.  Guardant360 analysis obtained in 2022 did not show any actionable mutation.  Prior Therapy:  Androgen deprivation therapy in 2014 ~2018.  He developed castration resistant in 2018.  His PSA was up to 11.3 at that time.  He is status post T10, T12, kyphoplasty completed on January 22 of 2020.  Current therapy:  Zytiga 1000 mg daily with prednisone at 5 mg daily started in 2018.  Eligard 22.5 mg every 3 months.    Prolia 60 mg every 6 months.  Next injection will be given today and repeated in 6 months.  Interim History: Mr. Baldree returns today for repeat evaluation with his wife.  He denies any changes or complications since the last visit.  He denies any nausea, vomiting or abdominal pain.  He denies any complications related to Zytiga.  He continues to have chronic pain although pain is manageable with the current regimen using oxycodone as needed.         Medications: Updated on review. Current Outpatient Medications  Medication Sig Dispense Refill   abiraterone acetate (ZYTIGA) 250 MG tablet Take 4 tablets (1,000 mg total) by mouth daily. 120 tablet 10   acetaminophen (TYLENOL) 325 MG tablet Take 325 mg by mouth daily as needed for fever.     albuterol (PROVENTIL) (2.5 MG/3ML) 0.083% nebulizer solution Take 3 mLs (2.5 mg total) by nebulization every 6 (six) hours as needed for wheezing or shortness of breath. 360 mL 11   albuterol (VENTOLIN HFA) 108 (90 Base) MCG/ACT inhaler Inhale 2 puffs into the lungs every 6 (six) hours as needed for wheezing or shortness of breath. 18 g 3   azelastine (ASTELIN) 0.1 % nasal spray  Place 2 puffs into the nose in the morning and at bedtime. 90 mL 1   CALCIUM-VITAMIN D PO Take 1 tablet by mouth 2 (two) times daily.      carvedilol (COREG) 3.125 MG tablet TAKE 1 TABLET 2 TIMES A DAY WITH BREAKFAST & SUPPER 180 tablet 1   denosumab (PROLIA) 60 MG/ML SOSY injection Inject 60 mg into the skin every 6 (six) months.     Famotidine (PEPCID AC PO) Take by mouth.     fluticasone (FLONASE) 50 MCG/ACT nasal spray Place 2 sprays into both nostrils daily. 48 g 1   Fluticasone-Umeclidin-Vilant (TRELEGY ELLIPTA) 100-62.5-25 MCG/ACT AEPB Inhale 1 puff into the lungs daily. 120 each 3   furosemide (LASIX) 20 MG tablet TAKE ONE TABLET TWICE DAILY 180 tablet 1   leuprolide (LUPRON) 11.25 MG injection Inject 11.25 mg into the muscle every 3 (three) months.     Leuprolide Acetate (LUPRON IJ) Inject 1 Dose as directed every 4 (four) months. This is administered by [facility]     meclizine (ANTIVERT) 25 MG tablet Take 1 tablet (25 mg total) by mouth 3 (three) times daily as needed for dizziness. 270 tablet 0   Multiple Vitamins-Minerals (CENTRUM SILVER 50+MEN PO) Take 1 tablet by mouth daily.     NARCAN 4 MG/0.1ML LIQD nasal spray kit Place 0.4 mg into the nose once as needed (overdose).      olmesartan (BENICAR) 40 MG tablet TAKE ONE TABLET ONCE DAILY 90 tablet  1   oxyCODONE (OXY IR/ROXICODONE) 5 MG immediate release tablet Take 1 tablet (5 mg total) by mouth every 4 (four) hours as needed for severe pain. 180 tablet 0   polyethylene glycol powder (GLYCOLAX/MIRALAX) 17 GM/SCOOP powder Take 17 g (1 capful) by mouth daily as directed. 510 g 6   predniSONE (DELTASONE) 5 MG tablet TAKE 1 TABLET 2 TIMES A DAY 90 tablet 3   sodium chloride 1 g tablet Take 1 g by mouth 2 (two) times daily with a meal.     solifenacin (VESICARE) 5 MG tablet Take 5 mg by mouth daily.     Specialty Vitamins Products (VISTA ADVANCED CAROTENOID) CAPS Take 1 capsule by mouth every evening.     vitamin C (ASCORBIC ACID)  500 MG tablet Take 500 mg by mouth every evening.     vitamin E 180 MG (400 UNITS) capsule Take 400 Units by mouth every evening.     No current facility-administered medications for this visit.     Allergies:  Allergies  Allergen Reactions   Amlodipine Swelling    edema   Tetracycline Rash      Physical Exam:     Blood pressure (!) 143/88, pulse 87, temperature 97.6 F (36.4 C), temperature source Temporal, resp. rate 18, height '5\' 8"'$  (1.727 m), weight 226 lb 3.2 oz (102.6 kg), SpO2 98 %.        ECOG: 1   General appearance: Alert, awake without any distress. Head: Atraumatic without abnormalities Oropharynx: Without any thrush or ulcers. Eyes: No scleral icterus. Lymph nodes: No lymphadenopathy noted in the cervical, supraclavicular, or axillary nodes Heart:regular rate and rhythm, without any murmurs or gallops.   Lung: Clear to auscultation without any rhonchi, wheezes or dullness to percussion. Abdomin: Soft, nontender without any shifting dullness or ascites. Musculoskeletal: No clubbing or cyanosis. Neurological: No motor or sensory deficits. Skin: No rashes or lesions.             Lab Results: Lab Results  Component Value Date   WBC 10.4 08/11/2022   HGB 12.6 (L) 08/11/2022   HCT 36.5 (L) 08/11/2022   MCV 87.3 08/11/2022   PLT 282 08/11/2022   PSA 1.3 05/12/2022     Chemistry      Component Value Date/Time   NA 136 08/11/2022 1109   K 4.3 08/11/2022 1109   CL 103 08/11/2022 1109   CO2 28 08/11/2022 1109   BUN 16 08/11/2022 1109   CREATININE 0.84 08/11/2022 1109      Component Value Date/Time   CALCIUM 9.6 08/11/2022 1109   ALKPHOS 48 08/11/2022 1109   AST 12 (L) 08/11/2022 1109   ALT 15 08/11/2022 1109   BILITOT 0.4 08/11/2022 1109         Latest Reference Range & Units 07/30/21 00:00 07/30/21 14:46 11/07/21 13:26 02/10/22 13:22 05/12/22 00:00  PSA  0.7 (E)    1.3 (E)  Prostate Specific Ag, Serum 0.0 - 4.0 ng/mL  0.7  0.7 0.8   (E): External lab result     Impression and Plan:  67 year old man with:   1.    Castration-resistant advanced prostate cancer with lymphadenopathy diagnosed in 2018.   He continues to be on Zytiga with overall with excellent PSA response since 2018.  His PSA is rising rather slowly over the last few years with very slow doubling time that is exceeding 12 months.  The natural course of this disease and role for different salvage therapy options were discussed.  If his PSA continues to rise with a doubling time of 6 months or less, I recommend updating his staging scan with a PSMA PET.  Different salvage therapy options were discussed today.  They are switching to a different androgen receptor pathway inhibitor such as enzalutamide, apalutamide or darolutamide would be reasonable in his case before transitioning to Taxotere chemotherapy.  If his PSMA PET scan shows more aggressive disease then Taxotere chemotherapy would be his best option.  After discussion today, I have recommended continuing Zytiga and monitoring his PSA for the next 3 to 6 months.  I do recommend updating his imaging studies with a PSMA PET scan in the next 3 to 6 months in any case.  Last imaging studies in 2023 showed no evidence of residual disease.   2.  Compression fracture tween T10 and T12: No evidence of metastatic disease at that time.  This appears to be more osteoporosis related.    3.  Bone directed therapy: He will receive Prolia today and repeated in 6 months.  Complications including osteonecrosis of the jaw and hypocalcemia were reiterated.   4.  Pain: Related to his compression fracture and currently on oxycodone.   5.  Prognosis and goals of care: Therapy remains palliative although aggressive measures are warranted given his reasonable performance status.      6.  Follow-up: In 3 months for a follow-up.   30  minutes were spent on this visit.  The time was dedicated to updating disease  status, treatment choices and outlining future plan of care review.         Zola Button, MD 1/16/202412:00 PM

## 2022-08-12 ENCOUNTER — Other Ambulatory Visit: Payer: Medicare Other

## 2022-08-12 ENCOUNTER — Ambulatory Visit: Payer: Medicare Other | Admitting: Oncology

## 2022-08-12 ENCOUNTER — Ambulatory Visit: Payer: Medicare Other

## 2022-08-12 ENCOUNTER — Telehealth: Payer: Self-pay | Admitting: *Deleted

## 2022-08-12 LAB — PROSTATE-SPECIFIC AG, SERUM (LABCORP): Prostate Specific Ag, Serum: 1.5 ng/mL (ref 0.0–4.0)

## 2022-08-12 NOTE — Telephone Encounter (Signed)
-----  Message from Wyatt Portela, MD sent at 08/12/2022  8:45 AM EST ----- Please let him know his PSA changed very little. No change in treatment is needed

## 2022-08-12 NOTE — Telephone Encounter (Signed)
PC to patient, informed him of PSA results & Dr Alen Blew states no change in treatment is needed.  Patient verbalizes understanding.

## 2022-08-24 ENCOUNTER — Other Ambulatory Visit: Payer: Self-pay | Admitting: Hematology

## 2022-08-24 ENCOUNTER — Telehealth: Payer: Self-pay | Admitting: Hematology

## 2022-08-24 ENCOUNTER — Telehealth: Payer: Self-pay | Admitting: *Deleted

## 2022-08-24 DIAGNOSIS — C7951 Secondary malignant neoplasm of bone: Secondary | ICD-10-CM

## 2022-08-24 MED ORDER — OXYCODONE HCL 5 MG PO TABS: 5.0000 mg | ORAL_TABLET | ORAL | 0 refills | Status: DC | PRN

## 2022-08-24 NOTE — Telephone Encounter (Signed)
Scheduled appointment per 1/29 scheduling message. Patient is aware.

## 2022-08-24 NOTE — Telephone Encounter (Signed)
Notified of message below. Message to scheduler 

## 2022-08-24 NOTE — Telephone Encounter (Signed)
Pt states he needs a refill of Oxycodone 5 mg to be sent to [facility] in [place]. (Former Dr Alen Blew patient)

## 2022-08-26 ENCOUNTER — Other Ambulatory Visit: Payer: Self-pay

## 2022-08-26 DIAGNOSIS — C7951 Secondary malignant neoplasm of bone: Secondary | ICD-10-CM

## 2022-08-27 ENCOUNTER — Encounter: Payer: Self-pay | Admitting: Oncology

## 2022-08-27 ENCOUNTER — Other Ambulatory Visit (HOSPITAL_COMMUNITY): Payer: Self-pay

## 2022-08-27 ENCOUNTER — Telehealth: Payer: Self-pay

## 2022-08-27 MED ORDER — OXYCODONE HCL 5 MG PO TABS
5.0000 mg | ORAL_TABLET | ORAL | 0 refills | Status: DC | PRN
  Filled 2022-08-27: qty 90, 15d supply, fill #0

## 2022-08-27 NOTE — Telephone Encounter (Signed)
Notified by Shelle Iron, RN that pt's spouse called stating that [facility] and [facility] are out of stock on Oxycodone IR '5mg'$  tabs.  Dr. Burr Medico refilled medication on 08/24/2022 but pharmacy is out of stock.  Pt and spouse are requesting if new prescription could be sent to [facility] who does have Oxycodone IR '5mg'$  in stock.  Shelle Iron, RN checked with [facility] and confirmed they have stock of Oxycodone.  Shelle Iron, RN also contacted [facility] to cancel the previous prescription for Oxycodone IR '5mg'$  since they are out of stock.  Notified Dr. Burr Medico to send a new prescription for Oxycodone IR '5mg'$  to [facility].

## 2022-08-28 ENCOUNTER — Other Ambulatory Visit (HOSPITAL_COMMUNITY): Payer: Self-pay

## 2022-08-29 ENCOUNTER — Other Ambulatory Visit: Payer: Self-pay | Admitting: Internal Medicine

## 2022-08-29 DIAGNOSIS — I10 Essential (primary) hypertension: Secondary | ICD-10-CM

## 2022-09-02 ENCOUNTER — Ambulatory Visit (INDEPENDENT_AMBULATORY_CARE_PROVIDER_SITE_OTHER): Payer: Medicare Other | Admitting: Internal Medicine

## 2022-09-02 ENCOUNTER — Encounter: Payer: Self-pay | Admitting: Internal Medicine

## 2022-09-02 VITALS — BP 132/86 | HR 82 | Temp 98.8°F | Resp 16 | Ht 68.0 in | Wt 228.0 lb

## 2022-09-02 DIAGNOSIS — I10 Essential (primary) hypertension: Secondary | ICD-10-CM

## 2022-09-02 DIAGNOSIS — T402X5A Adverse effect of other opioids, initial encounter: Secondary | ICD-10-CM | POA: Insufficient documentation

## 2022-09-02 DIAGNOSIS — F410 Panic disorder [episodic paroxysmal anxiety] without agoraphobia: Secondary | ICD-10-CM

## 2022-09-02 DIAGNOSIS — K5903 Drug induced constipation: Secondary | ICD-10-CM | POA: Diagnosis not present

## 2022-09-02 MED ORDER — LUBIPROSTONE 24 MCG PO CAPS: 24.0000 ug | ORAL_CAPSULE | Freq: Two times a day (BID) | ORAL | 1 refills | Status: DC

## 2022-09-02 MED ORDER — DIAZEPAM 2 MG PO TABS: 2.0000 mg | ORAL_TABLET | Freq: Three times a day (TID) | ORAL | 2 refills | Status: DC | PRN

## 2022-09-02 NOTE — Patient Instructions (Signed)

## 2022-09-02 NOTE — Progress Notes (Signed)
Subjective:  Patient ID: [name] Holder, male    DOB: Dec 08, 1955  Age: 67 y.o. MRN: [id number]  CC: Hypertension   HPI [name] Holder presents for f/up -  He is taking oxycodone 4 times a day.  He complains of constipation and straining.  He has not gotten much symptom relief with laxatives, MiraLAX, Metamucil, and stool softeners.  He complains of waves of anxiety and panic.  He wants to take a benzodiazepine.  No facility-administered medications prior to visit.   Outpatient Medications Prior to Visit  Medication Sig Dispense Refill   abiraterone acetate (ZYTIGA) 250 MG tablet Take 4 tablets (1,000 mg total) by mouth daily. 120 tablet 10   acetaminophen (TYLENOL) 325 MG tablet Take 325 mg by mouth daily as needed for fever.     albuterol (PROVENTIL) (2.5 MG/3ML) 0.083% nebulizer solution Take 3 mLs (2.5 mg total) by nebulization every 6 (six) hours as needed for wheezing or shortness of breath. 360 mL 11   albuterol (VENTOLIN HFA) 108 (90 Base) MCG/ACT inhaler Inhale 2 puffs into the lungs every 6 (six) hours as needed for wheezing or shortness of breath. 18 g 3   azelastine (ASTELIN) 0.1 % nasal spray Place 2 puffs into the nose in the morning and at bedtime. 90 mL 1   CALCIUM-VITAMIN D PO Take 1 tablet by mouth 2 (two) times daily.      carvedilol (COREG) 3.125 MG tablet TAKE 1 TABLET 2 TIMES A DAY WITH BREAKFAST & SUPPER 180 tablet 1   denosumab (PROLIA) 60 MG/ML SOSY injection Inject 60 mg into the skin every 6 (six) months.     Famotidine (PEPCID AC PO) Take 1 tablet by mouth at bedtime.     fluticasone (FLONASE) 50 MCG/ACT nasal spray Place 2 sprays into both nostrils daily. 48 g 1   Fluticasone-Umeclidin-Vilant (TRELEGY ELLIPTA) 100-62.5-25 MCG/ACT AEPB Inhale 1 puff into the lungs daily. 120 each 3   furosemide (LASIX) 20 MG tablet TAKE ONE TABLET TWICE DAILY 180 tablet 0   leuprolide (LUPRON) 11.25 MG injection Inject 11.25 mg into the muscle every 3 (three) months.      meclizine (ANTIVERT) 25 MG tablet Take 1 tablet (25 mg total) by mouth 3 (three) times daily as needed for dizziness. 270 tablet 0   Multiple Vitamins-Minerals (CENTRUM SILVER 50+MEN PO) Take 1 tablet by mouth daily.     NARCAN 4 MG/0.1ML LIQD nasal spray kit Place 0.4 mg into the nose once as needed (overdose).      olmesartan (BENICAR) 40 MG tablet TAKE ONE TABLET ONCE DAILY 90 tablet 1   oxyCODONE (OXY IR/ROXICODONE) 5 MG immediate release tablet Take 1 tablet (5 mg total) by mouth every 4 (four) hours as needed for severe pain. 90 tablet 0   polyethylene glycol powder (GLYCOLAX/MIRALAX) 17 GM/SCOOP powder Take 17 g (1 capful) by mouth daily as directed. 510 g 6   predniSONE (DELTASONE) 5 MG tablet TAKE 1 TABLET 2 TIMES A DAY 90 tablet 3   sodium chloride 1 g tablet Take 1 g by mouth 2 (two) times daily with a meal.     solifenacin (VESICARE) 5 MG tablet Take 5 mg by mouth daily.     Specialty Vitamins Products (VISTA ADVANCED CAROTENOID) CAPS Take 1 capsule by mouth every evening.     vitamin C (ASCORBIC ACID) 500 MG tablet Take 500 mg by mouth every evening.     vitamin E 180 MG (400 UNITS) capsule Take 400 Units  by mouth every evening.     Leuprolide Acetate (LUPRON IJ) Inject 1 Dose as directed every 4 (four) months. This is administered by [facility]      ROS Review of Systems  Constitutional: Negative.  Negative for chills, diaphoresis, fatigue and fever.  HENT:  Positive for trouble swallowing.   Respiratory:  Negative for cough, chest tightness, shortness of breath and wheezing.   Cardiovascular:  Negative for chest pain, palpitations and leg swelling.  Gastrointestinal:  Positive for constipation. Negative for abdominal pain, anal bleeding, nausea and vomiting.  Genitourinary: Negative.  Negative for difficulty urinating.  Musculoskeletal: Negative.  Negative for arthralgias and myalgias.  Skin: Negative.  Negative for rash.  Neurological: Negative.  Negative for dizziness  and weakness.  Hematological:  Negative for adenopathy. Does not bruise/bleed easily.  Psychiatric/Behavioral:  The patient is nervous/anxious.     Objective:  BP 132/86 (BP Location: Left Arm, Patient Position: Sitting, Cuff Size: Large)   Pulse 82   Temp 98.8 F (37.1 C) (Oral)   Resp 16   Ht 5' 8"$  (1.727 m)   Wt 228 lb (103.4 kg)   SpO2 97%   BMI 34.67 kg/m   BP Readings from Last 3 Encounters:  09/04/22 (!) 142/91  09/02/22 132/86  08/11/22 (!) 143/88    Wt Readings from Last 3 Encounters:  09/03/22 228 lb (103.4 kg)  09/02/22 228 lb (103.4 kg)  08/11/22 226 lb 3.2 oz (102.6 kg)    Physical Exam Vitals reviewed.  Constitutional:      Appearance: He is not ill-appearing.  HENT:     Mouth/Throat:     Mouth: Mucous membranes are moist.  Eyes:     General: No scleral icterus.    Conjunctiva/sclera: Conjunctivae normal.  Cardiovascular:     Rate and Rhythm: Normal rate and regular rhythm.     Heart sounds: No murmur heard.    No gallop.  Pulmonary:     Effort: Pulmonary effort is normal. No respiratory distress.     Breath sounds: No stridor. No wheezing, rhonchi or rales.  Abdominal:     General: Abdomen is protuberant. Bowel sounds are normal. There is no distension.     Palpations: Abdomen is soft. There is no hepatomegaly, splenomegaly or mass.  Musculoskeletal:        General: Normal range of motion.     Cervical back: Neck supple.     Right lower leg: No edema.     Left lower leg: No edema.  Lymphadenopathy:     Cervical: No cervical adenopathy.  Skin:    General: Skin is warm and dry.     Coloration: Skin is not pale.  Neurological:     Mental Status: He is alert. Mental status is at baseline.  Psychiatric:        Attention and Perception: He is inattentive.        Mood and Affect: Mood is anxious. Mood is not depressed. Affect is not flat.        Speech: Speech normal.        Behavior: Behavior normal.        Thought Content: Thought content  normal.        Cognition and Memory: Cognition normal.     Lab Results  Component Value Date   WBC 18.4 (H) 09/04/2022   HGB 12.3 (L) 09/04/2022   HCT 38.0 (L) 09/04/2022   PLT 246 09/04/2022   GLUCOSE 139 (H) 09/04/2022   CHOL 173  10/28/2021   TRIG 167.0 (H) 10/28/2021   HDL 38.10 (L) 10/28/2021   LDLCALC 101 (H) 10/28/2021   ALT 21 09/04/2022   AST 23 09/04/2022   NA 134 (L) 09/04/2022   K 3.8 09/04/2022   CL 106 09/04/2022   CREATININE 0.75 09/04/2022   BUN 14 09/04/2022   CO2 19 (L) 09/04/2022   TSH 0.86 09/30/2020   PSA 1.3 05/12/2022   INR 1.1 09/17/2021   HGBA1C 5.7 10/28/2021    DG Chest 2 View  Result Date: 03/26/2022 CLINICAL DATA:  Pneumonia. EXAM: CHEST - 2 VIEW COMPARISON:  Chest radiograph August 08/25/2021. FINDINGS: The heart size and mediastinal contours are unchanged. Similar appearance of the right-greater-than-left bibasilar airspace opacities seen on same-day radiograph at 1042 hours. No pleural effusion or pneumothorax. No significant interval change in the chronic thoracic compression fractures with augmentation at some levels. IMPRESSION: Multifocal pneumonia not significantly changed recommend radiographic follow-up to resolution. Electronically Signed   By: Dahlia Bailiff M.D.   On: 03/26/2022 18:05    Assessment & Plan:   Greyer was seen today for hypertension.  Diagnoses and all orders for this visit:  Panic anxiety syndrome -     diazepam (VALIUM) 2 MG tablet; Take 1 tablet (2 mg total) by mouth every 8 (eight) hours as needed for anxiety.  Therapeutic opioid-induced constipation (OIC) -     lubiprostone (AMITIZA) 24 MCG capsule; Take 1 capsule (24 mcg total) by mouth 2 (two) times daily with a meal.  Essential hypertension- His blood pressure is adequately well-controlled.   I am having Arkell Feige. [place] start on lubiprostone and diazepam. I am also having him maintain his CALCIUM-VITAMIN D PO, Narcan, acetaminophen, Multiple  Vitamins-Minerals (CENTRUM SILVER 50+MEN PO), solifenacin, sodium chloride, denosumab, polyethylene glycol powder, ascorbic acid, vitamin E, Vista Advanced Carotenoid, abiraterone acetate, azelastine, fluticasone, carvedilol, leuprolide, albuterol, albuterol, Trelegy Ellipta, Famotidine (PEPCID AC PO), meclizine, predniSONE, olmesartan, oxyCODONE, and furosemide.  Meds ordered this encounter  Medications   lubiprostone (AMITIZA) 24 MCG capsule    Sig: Take 1 capsule (24 mcg total) by mouth 2 (two) times daily with a meal.    Dispense:  180 capsule    Refill:  1   diazepam (VALIUM) 2 MG tablet    Sig: Take 1 tablet (2 mg total) by mouth every 8 (eight) hours as needed for anxiety.    Dispense:  90 tablet    Refill:  2     Follow-up: Return in about 3 months (around 12/01/2022).  Scarlette Calico, MD

## 2022-09-03 ENCOUNTER — Other Ambulatory Visit: Payer: Self-pay

## 2022-09-03 ENCOUNTER — Emergency Department (HOSPITAL_BASED_OUTPATIENT_CLINIC_OR_DEPARTMENT_OTHER): Payer: Medicare Other

## 2022-09-03 ENCOUNTER — Encounter (HOSPITAL_COMMUNITY): Payer: Self-pay | Admitting: Internal Medicine

## 2022-09-03 ENCOUNTER — Inpatient Hospital Stay (HOSPITAL_BASED_OUTPATIENT_CLINIC_OR_DEPARTMENT_OTHER)
Admission: EM | Admit: 2022-09-03 | Discharge: 2022-09-06 | DRG: 871 | Disposition: A | Discharge status: Home / Self Care | Payer: Medicare Other | Attending: Internal Medicine | Admitting: Internal Medicine

## 2022-09-03 DIAGNOSIS — M545 Low back pain, unspecified: Secondary | ICD-10-CM | POA: Diagnosis present

## 2022-09-03 DIAGNOSIS — Z8261 Family history of arthritis: Secondary | ICD-10-CM

## 2022-09-03 DIAGNOSIS — J4489 Other specified chronic obstructive pulmonary disease: Secondary | ICD-10-CM | POA: Diagnosis present

## 2022-09-03 DIAGNOSIS — I10 Essential (primary) hypertension: Secondary | ICD-10-CM | POA: Diagnosis not present

## 2022-09-03 DIAGNOSIS — Z8042 Family history of malignant neoplasm of prostate: Secondary | ICD-10-CM

## 2022-09-03 DIAGNOSIS — A419 Sepsis, unspecified organism: Secondary | ICD-10-CM | POA: Diagnosis not present

## 2022-09-03 DIAGNOSIS — G8929 Other chronic pain: Secondary | ICD-10-CM | POA: Diagnosis not present

## 2022-09-03 DIAGNOSIS — Z87891 Personal history of nicotine dependence: Secondary | ICD-10-CM

## 2022-09-03 DIAGNOSIS — E669 Obesity, unspecified: Secondary | ICD-10-CM | POA: Diagnosis not present

## 2022-09-03 DIAGNOSIS — Z79899 Other long term (current) drug therapy: Secondary | ICD-10-CM

## 2022-09-03 DIAGNOSIS — J9811 Atelectasis: Secondary | ICD-10-CM | POA: Diagnosis not present

## 2022-09-03 DIAGNOSIS — J4530 Mild persistent asthma, uncomplicated: Secondary | ICD-10-CM | POA: Diagnosis present

## 2022-09-03 DIAGNOSIS — E872 Acidosis, unspecified: Secondary | ICD-10-CM | POA: Diagnosis not present

## 2022-09-03 DIAGNOSIS — Z8551 Personal history of malignant neoplasm of bladder: Secondary | ICD-10-CM | POA: Diagnosis not present

## 2022-09-03 DIAGNOSIS — Z8249 Family history of ischemic heart disease and other diseases of the circulatory system: Secondary | ICD-10-CM

## 2022-09-03 DIAGNOSIS — K5900 Constipation, unspecified: Secondary | ICD-10-CM | POA: Diagnosis present

## 2022-09-03 DIAGNOSIS — C7952 Secondary malignant neoplasm of bone marrow: Secondary | ICD-10-CM | POA: Diagnosis present

## 2022-09-03 DIAGNOSIS — Z823 Family history of stroke: Secondary | ICD-10-CM

## 2022-09-03 DIAGNOSIS — J9601 Acute respiratory failure with hypoxia: Secondary | ICD-10-CM | POA: Diagnosis present

## 2022-09-03 DIAGNOSIS — R6520 Severe sepsis without septic shock: Secondary | ICD-10-CM | POA: Diagnosis not present

## 2022-09-03 DIAGNOSIS — Z83438 Family history of other disorder of lipoprotein metabolism and other lipidemia: Secondary | ICD-10-CM

## 2022-09-03 DIAGNOSIS — J440 Chronic obstructive pulmonary disease with acute lower respiratory infection: Secondary | ICD-10-CM | POA: Diagnosis not present

## 2022-09-03 DIAGNOSIS — J189 Pneumonia, unspecified organism: Secondary | ICD-10-CM | POA: Diagnosis not present

## 2022-09-03 DIAGNOSIS — E785 Hyperlipidemia, unspecified: Secondary | ICD-10-CM | POA: Diagnosis not present

## 2022-09-03 DIAGNOSIS — C61 Malignant neoplasm of prostate: Secondary | ICD-10-CM | POA: Diagnosis not present

## 2022-09-03 DIAGNOSIS — C7951 Secondary malignant neoplasm of bone: Secondary | ICD-10-CM | POA: Diagnosis present

## 2022-09-03 DIAGNOSIS — Z1152 Encounter for screening for COVID-19: Secondary | ICD-10-CM

## 2022-09-03 DIAGNOSIS — D649 Anemia, unspecified: Secondary | ICD-10-CM | POA: Diagnosis present

## 2022-09-03 DIAGNOSIS — E66812 Obesity, class 2: Secondary | ICD-10-CM | POA: Diagnosis present

## 2022-09-03 DIAGNOSIS — R55 Syncope and collapse: Secondary | ICD-10-CM | POA: Diagnosis not present

## 2022-09-03 DIAGNOSIS — Z888 Allergy status to other drugs, medicaments and biological substances status: Secondary | ICD-10-CM

## 2022-09-03 DIAGNOSIS — E8809 Other disorders of plasma-protein metabolism, not elsewhere classified: Secondary | ICD-10-CM | POA: Diagnosis not present

## 2022-09-03 DIAGNOSIS — Z7952 Long term (current) use of systemic steroids: Secondary | ICD-10-CM

## 2022-09-03 DIAGNOSIS — I5032 Chronic diastolic (congestive) heart failure: Secondary | ICD-10-CM | POA: Diagnosis present

## 2022-09-03 DIAGNOSIS — I110 Hypertensive heart disease with heart failure: Secondary | ICD-10-CM | POA: Diagnosis not present

## 2022-09-03 DIAGNOSIS — N400 Enlarged prostate without lower urinary tract symptoms: Secondary | ICD-10-CM | POA: Diagnosis present

## 2022-09-03 DIAGNOSIS — Z8701 Personal history of pneumonia (recurrent): Secondary | ICD-10-CM

## 2022-09-03 DIAGNOSIS — Z833 Family history of diabetes mellitus: Secondary | ICD-10-CM

## 2022-09-03 DIAGNOSIS — R0602 Shortness of breath: Secondary | ICD-10-CM | POA: Diagnosis not present

## 2022-09-03 DIAGNOSIS — N179 Acute kidney failure, unspecified: Secondary | ICD-10-CM | POA: Diagnosis not present

## 2022-09-03 DIAGNOSIS — Z6835 Body mass index (BMI) 35.0-35.9, adult: Secondary | ICD-10-CM | POA: Diagnosis not present

## 2022-09-03 LAB — CBC WITH DIFFERENTIAL/PLATELET
Abs Immature Granulocytes: 0.14 10*3/uL — ABNORMAL HIGH (ref 0.00–0.07)
Basophils Absolute: 0.1 10*3/uL (ref 0.0–0.1)
Basophils Relative: 0 %
Eosinophils Absolute: 0.1 10*3/uL (ref 0.0–0.5)
Eosinophils Relative: 0 %
HCT: 40.8 % (ref 39.0–52.0)
Hemoglobin: 14 g/dL (ref 13.0–17.0)
Immature Granulocytes: 1 %
Lymphocytes Relative: 7 %
Lymphs Abs: 1.8 10*3/uL (ref 0.7–4.0)
MCH: 29.5 pg (ref 26.0–34.0)
MCHC: 34.3 g/dL (ref 30.0–36.0)
MCV: 86.1 fL (ref 80.0–100.0)
Monocytes Absolute: 1.1 10*3/uL — ABNORMAL HIGH (ref 0.1–1.0)
Monocytes Relative: 4 %
Neutro Abs: 22.5 10*3/uL — ABNORMAL HIGH (ref 1.7–7.7)
Neutrophils Relative %: 88 %
Platelets: 291 10*3/uL (ref 150–400)
RBC: 4.74 MIL/uL (ref 4.22–5.81)
RDW: 13.6 % (ref 11.5–15.5)
Smear Review: NORMAL
WBC: 25.6 10*3/uL — ABNORMAL HIGH (ref 4.0–10.5)
nRBC: 0 % (ref 0.0–0.2)

## 2022-09-03 LAB — COMPREHENSIVE METABOLIC PANEL
ALT: 24 U/L (ref 0–44)
AST: 24 U/L (ref 15–41)
Albumin: 4.1 g/dL (ref 3.5–5.0)
Alkaline Phosphatase: 49 U/L (ref 38–126)
Anion gap: 17 — ABNORMAL HIGH (ref 5–15)
BUN: 23 mg/dL (ref 8–23)
CO2: 19 mmol/L — ABNORMAL LOW (ref 22–32)
Calcium: 9.1 mg/dL (ref 8.9–10.3)
Chloride: 99 mmol/L (ref 98–111)
Creatinine, Ser: 1.27 mg/dL — ABNORMAL HIGH (ref 0.61–1.24)
GFR, Estimated: >60 mL/min (ref >60)
Glucose, Bld: 121 mg/dL — ABNORMAL HIGH (ref 70–99)
Potassium: 4 mmol/L (ref 3.5–5.1)
Sodium: 135 mmol/L (ref 135–145)
Total Bilirubin: 0.7 mg/dL (ref 0.3–1.2)
Total Protein: 6.7 g/dL (ref 6.5–8.1)

## 2022-09-03 LAB — RESP PANEL BY RT-PCR (RSV, FLU A&B, COVID)  RVPGX2
Influenza A by PCR: NEGATIVE
Influenza B by PCR: NEGATIVE
Resp Syncytial Virus by PCR: NEGATIVE
SARS Coronavirus 2 by RT PCR: NEGATIVE

## 2022-09-03 LAB — LACTIC ACID, PLASMA
Lactic Acid, Venous: 2.5 mmol/L (ref 0.5–1.9)
Lactic Acid, Venous: 2.1 mmol/L (ref 0.5–1.9)

## 2022-09-03 LAB — TROPONIN I (HIGH SENSITIVITY): Troponin I (High Sensitivity): 9 ng/L (ref <18)

## 2022-09-03 LAB — MRSA NEXT GEN BY PCR, NASAL: MRSA by PCR Next Gen: NOT DETECTED

## 2022-09-03 LAB — BRAIN NATRIURETIC PEPTIDE: B Natriuretic Peptide: 79.2 pg/mL (ref 0.0–100.0)

## 2022-09-03 MED ORDER — METHYLPREDNISOLONE SODIUM SUCC 40 MG IJ SOLR
40.0000 mg | Freq: Once | INTRAMUSCULAR | Status: AC
  Administered 2022-09-03: 40 mg via INTRAVENOUS
  Filled 2022-09-03: qty 1

## 2022-09-03 MED ORDER — PREDNISONE 5 MG PO TABS
10.0000 mg | ORAL_TABLET | Freq: Every day | ORAL | Status: AC
  Administered 2022-09-04 – 2022-09-06 (×3): 10 mg via ORAL
  Filled 2022-09-03 (×3): qty 2

## 2022-09-03 MED ORDER — POLYETHYLENE GLYCOL 3350 17 G PO PACK
17.0000 g | PACK | Freq: Every day | ORAL | Status: DC
  Administered 2022-09-03 – 2022-09-05 (×3): 17 g via ORAL
  Filled 2022-09-03 (×3): qty 1

## 2022-09-03 MED ORDER — ONDANSETRON HCL 4 MG/2ML IJ SOLN: 4.0000 mg | Freq: Four times a day (QID) | INTRAMUSCULAR | Status: DC | PRN

## 2022-09-03 MED ORDER — IRBESARTAN 150 MG PO TABS
300.0000 mg | ORAL_TABLET | Freq: Every day | ORAL | Status: DC
  Administered 2022-09-04 – 2022-09-06 (×3): 300 mg via ORAL
  Filled 2022-09-03 (×3): qty 2

## 2022-09-03 MED ORDER — LACTATED RINGERS IV BOLUS (SEPSIS)
1000.0000 mL | Freq: Once | INTRAVENOUS | Status: AC
  Administered 2022-09-03: 1000 mL via INTRAVENOUS

## 2022-09-03 MED ORDER — SODIUM CHLORIDE 0.9 % IV SOLN
500.0000 mg | INTRAVENOUS | Status: DC
  Administered 2022-09-03 – 2022-09-06 (×4): 500 mg via INTRAVENOUS
  Filled 2022-09-03 (×4): qty 5

## 2022-09-03 MED ORDER — POLYETHYLENE GLYCOL 3350 17 GM/SCOOP PO POWD
17.0000 g | Freq: Every day | ORAL | Status: DC
  Filled 2022-09-03: qty 255

## 2022-09-03 MED ORDER — SODIUM CHLORIDE 0.9 % IV SOLN: INTRAVENOUS | Status: AC

## 2022-09-03 MED ORDER — SODIUM CHLORIDE 0.9 % IV SOLN
2.0000 g | INTRAVENOUS | Status: DC
  Administered 2022-09-03 – 2022-09-06 (×4): 2 g via INTRAVENOUS
  Filled 2022-09-03 (×4): qty 20

## 2022-09-03 MED ORDER — DIAZEPAM 2 MG PO TABS: 2.0000 mg | ORAL_TABLET | Freq: Three times a day (TID) | ORAL | Status: DC | PRN

## 2022-09-03 MED ORDER — SODIUM CHLORIDE 1 G PO TABS
1.0000 g | ORAL_TABLET | Freq: Two times a day (BID) | ORAL | Status: DC
  Administered 2022-09-04 – 2022-09-06 (×5): 1 g via ORAL
  Filled 2022-09-03 (×6): qty 1

## 2022-09-03 MED ORDER — ACETAMINOPHEN 325 MG PO TABS
650.0000 mg | ORAL_TABLET | Freq: Four times a day (QID) | ORAL | Status: DC | PRN
  Administered 2022-09-04 – 2022-09-06 (×4): 650 mg via ORAL
  Filled 2022-09-03 (×4): qty 2

## 2022-09-03 MED ORDER — ONDANSETRON HCL 4 MG PO TABS: 4.0000 mg | ORAL_TABLET | Freq: Four times a day (QID) | ORAL | Status: DC | PRN

## 2022-09-03 MED ORDER — CARVEDILOL 3.125 MG PO TABS
3.1250 mg | ORAL_TABLET | Freq: Two times a day (BID) | ORAL | Status: DC
  Administered 2022-09-03 – 2022-09-06 (×6): 3.125 mg via ORAL
  Filled 2022-09-03 (×6): qty 1

## 2022-09-03 MED ORDER — ACETAMINOPHEN 650 MG RE SUPP: 650.0000 mg | Freq: Four times a day (QID) | RECTAL | Status: DC | PRN

## 2022-09-03 MED ORDER — IPRATROPIUM-ALBUTEROL 0.5-2.5 (3) MG/3ML IN SOLN
3.0000 mL | RESPIRATORY_TRACT | Status: DC | PRN
  Administered 2022-09-03 – 2022-09-04 (×3): 3 mL via RESPIRATORY_TRACT
  Filled 2022-09-03 (×3): qty 3

## 2022-09-03 MED ORDER — ENOXAPARIN SODIUM 60 MG/0.6ML IJ SOSY
50.0000 mg | PREFILLED_SYRINGE | INTRAMUSCULAR | Status: DC
  Administered 2022-09-03 – 2022-09-05 (×3): 50 mg via SUBCUTANEOUS
  Filled 2022-09-03 (×3): qty 0.6

## 2022-09-03 MED ORDER — AZELASTINE HCL 0.1 % NA SOLN
1.0000 | Freq: Two times a day (BID) | NASAL | Status: DC
  Administered 2022-09-03 – 2022-09-06 (×6): 1 via NASAL
  Filled 2022-09-03: qty 30

## 2022-09-03 MED ORDER — FAMOTIDINE 20 MG PO TABS
20.0000 mg | ORAL_TABLET | Freq: Every day | ORAL | Status: DC
  Administered 2022-09-03 – 2022-09-05 (×3): 20 mg via ORAL
  Filled 2022-09-03 (×3): qty 1

## 2022-09-03 MED ORDER — MECLIZINE HCL 25 MG PO TABS: 25.0000 mg | ORAL_TABLET | Freq: Three times a day (TID) | ORAL | Status: DC | PRN

## 2022-09-03 MED ORDER — MAGNESIUM SULFATE 2 GM/50ML IV SOLN
2.0000 g | Freq: Once | INTRAVENOUS | Status: AC
  Administered 2022-09-03: 2 g via INTRAVENOUS
  Filled 2022-09-03: qty 50

## 2022-09-03 MED ORDER — SODIUM CHLORIDE 0.9 % IV BOLUS
1000.0000 mL | Freq: Once | INTRAVENOUS | Status: AC
  Administered 2022-09-03: 1000 mL via INTRAVENOUS

## 2022-09-03 MED ORDER — OXYCODONE HCL 5 MG PO TABS
5.0000 mg | ORAL_TABLET | ORAL | Status: DC | PRN
  Administered 2022-09-03 – 2022-09-06 (×10): 5 mg via ORAL
  Filled 2022-09-03 (×11): qty 1

## 2022-09-03 MED ORDER — FESOTERODINE FUMARATE ER 4 MG PO TB24
4.0000 mg | ORAL_TABLET | Freq: Every day | ORAL | Status: DC
  Administered 2022-09-03 – 2022-09-06 (×4): 4 mg via ORAL
  Filled 2022-09-03 (×4): qty 1

## 2022-09-03 NOTE — H&P (Signed)
History and Physical    Patient: [name] Holder WNU:[id number] DOB: 13-Sep-1955 DOA: 09/03/2022 DOS: the patient was seen and examined on 09/03/2022 PCP: [name] Lima, MD  Patient coming from: Home  Chief Complaint:  Chief Complaint  Patient presents with   Weakness   Shortness of Breath   HPI: [name] Holder is a 67 y.o. male with medical history significant of allergic rhinitis, bladder cancer, chronic constipation, chronic lower back pain, history of vertebral compression fracture T10 and T12, urolithiasis, history of pneumonia, colon polyps, BPH, prostate cancer, hypertension, mild persistent asthma, osteopenia who came into the emergency department due to generalized weakness associated with right lower lobe pleuritic chest pain, dyspnea, cough, wheezing, fatigue, chills and fever since earlier this morning.  Patient thinks he may have aspirated sputum earlier in the day. He denied  rhinorrhea, sore throat or hemoptysis.  No palpitations, diaphoresis, PND, orthopnea or pitting edema of the lower extremities.  No abdominal pain, nausea, emesis, diarrhea, constipation, melena or hematochezia.  No flank pain, dysuria, frequency or hematuria.  No polyuria, polydipsia, polyphagia or blurred vision.   Lab work: His CBC showed white count 25.6, hemoglobin 14.0 g deciliter platelets 291.  Troponin and BNP were normal.  Coronavirus, influenza A/B and RSV PCR negative.  CMP showed a CO2 of 19 mmol/L with an anion gap of 17, the rest of the electrolytes were normal.  Glucose 121, BUN 23 and creatinine 1.27 mg/dL.  LFTs were normal.  Imaging: Portable 1 view chest radiograph showing normal heart size and mediastinal contours.  There is patchy airspace opacities in the periphery of the right lower lobe, suspicious for pneumonia.  Follow-up imaging recommended to ensure resolution.  ED course: Initial vital signs were temperature 97.4 F, pulse 94, respiration 19, BP 124/104 mmHg O2 sat 99% on room  air.  The patient received 1000 mL of normal saline bolus, 2 g of ceftriaxone and 500 mg of azithromycin IVPB.   Review of Systems: As mentioned in the history of present illness. All other systems reviewed and are negative.  Past Medical History:  Diagnosis Date   Allergic rhinitis    At risk for sleep apnea    STOP-BANG SCORE = 5    (routed to pt's pcp 09-08-2018)   Bladder cancer ([name]) dx'd 2020   Chronic constipation    Chronic low back pain    Chronic pain    DOE (dyspnea on exertion)    History of kidney stones    History of recent pneumonia 08/10/2018   acute bronchopneumonia;  follow-up cxr 08-28-2018 in epic   History of vertebral compression fracture    T10 and T12 s/p kyphoplasty   Hx of colonic polyps    Hyperplasia of prostate with lower urinary tract symptoms (LUTS)    Hypertension    Mild persistent asthma    followed by pcp   Numbness in both legs    secondary to a fall, per pt has had work-up and couldn't find anything   Osteopenia    Prostate cancer metastatic to pelvis [name] [facility]) urologist-- [name] Holder/  oncologist -- [name] Holder   dx 2014 advanced prostate cancer with pelvid adenopathy,  Stage T2c, Gleason 7;  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   Wears glasses    Past Surgical History:  Procedure Laterality Date   COLONOSCOPY     IR KYPHO EA ADDL LEVEL THORACIC OR LUMBAR  08/17/2018   IR KYPHO EA ADDL LEVEL THORACIC OR LUMBAR  08/26/2018  IR KYPHO LUMBAR INC FX REDUCE BONE BX UNI/BIL CANNULATION INC/IMAGING  08/26/2018   IR KYPHO THORACIC WITH BONE BIOPSY  08/17/2018   IR RADIOLOGIST EVAL & MGMT  08/11/2018   IR RADIOLOGIST EVAL & MGMT  08/24/2018   LYMPHADENECTOMY Left 01/05/2013   Procedure: ROBOTIC LYMPHADENECTOMY;  Surgeon: [name] Gray, MD;  Location: [facility];  Service: Urology;  Laterality: Left;   PROSTATE BIOPSY     11/12 positive biopsies   ROTATOR CUFF REPAIR Left 2012   TRANSURETHRAL RESECTION OF BLADDER TUMOR N/A 09/15/2018   Procedure: TRANSURETHRAL RESECTION OF  BLADDER TUMOR (TURBT)WITH CYSTOSCOPY/ POST OPERATIVE INSTILLATION OF GEMCITABINE;  Surgeon: Raynelle Bring, MD;  Location: [facility];  Service: Urology;  Laterality: N/A;  GENERAL ANESTHESIA WITH PARALYSIS   UMBILICAL HERNIA REPAIR  09-02-2001   [name] t. price '@[facility]'$    Social History:  reports that he quit smoking about 9 years ago. His smoking use included cigarettes. He has a 25.00 pack-year smoking history. He has never used smokeless tobacco. He reports that he does not currently use alcohol after a past usage of about 2.0 standard drinks of alcohol per week. He reports that he does not use drugs.  Allergies  Allergen Reactions   Amlodipine Swelling    edema   Tetracycline Rash    Family History  Problem Relation Age of Onset   Arthritis Mother    Diabetes Mother    Hypertension Mother    Hyperlipidemia Mother    Hypertension Father    Prostate cancer Father        radiation in mid 38s   Heart attack Father    Cancer Sister        precancerous colon polyps, 6 in colon removed   Stroke Maternal Grandfather    Other Neg Hx        hyponatremia    Prior to Admission medications   Medication Sig Start Date End Date Taking? Authorizing Provider  abiraterone acetate (ZYTIGA) 250 MG tablet Take 4 tablets (1,000 mg total) by mouth daily. 10/24/21  Yes [name] Portela, MD  acetaminophen (TYLENOL) 325 MG tablet Take 325 mg by mouth daily as needed for fever.   Yes [provider]  albuterol (PROVENTIL) (2.5 MG/3ML) 0.083% nebulizer solution Take 3 mLs (2.5 mg total) by nebulization every 6 (six) hours as needed for wheezing or shortness of breath. 05/11/22  Yes Holder, [name] L, DO  albuterol (VENTOLIN HFA) 108 (90 Base) MCG/ACT inhaler Inhale 2 puffs into the lungs every 6 (six) hours as needed for wheezing or shortness of breath. 05/11/22  Yes Holder, [name] L, DO  azelastine (ASTELIN) 0.1 % nasal spray Place 2 puffs into the nose in the morning and at bedtime. 12/24/21  Yes [name] Lima, MD  CALCIUM-VITAMIN D PO Take 1 tablet by mouth 2 (two) times daily.    Yes [provider]  carvedilol (COREG) 3.125 MG tablet TAKE 1 TABLET 2 TIMES A DAY WITH BREAKFAST & SUPPER 05/05/22  Yes [name] Lima, MD  denosumab (PROLIA) 60 MG/ML SOSY injection Inject 60 mg into the skin every 6 (six) months.   Yes [provider]  Famotidine (PEPCID AC PO) Take 1 tablet by mouth at bedtime.   Yes [provider]  fluticasone (FLONASE) 50 MCG/ACT nasal spray Place 2 sprays into both nostrils daily. 01/06/22  Yes [name] Lima, MD  Fluticasone-Umeclidin-Vilant (TRELEGY ELLIPTA) 100-62.5-25 MCG/ACT AEPB Inhale 1 puff into the lungs daily. 05/11/22  Yes June Leap  L, DO  furosemide (LASIX) 20 MG tablet TAKE ONE TABLET TWICE DAILY 08/29/22  Yes [name] Lima, MD  leuprolide (LUPRON) 11.25 MG injection Inject 11.25 mg into the muscle every 3 (three) months.   Yes [provider]  meclizine (ANTIVERT) 25 MG tablet Take 1 tablet (25 mg total) by mouth 3 (three) times daily as needed for dizziness. 06/02/22  Yes [name] Lima, MD  Multiple Vitamins-Minerals (CENTRUM SILVER 50+MEN PO) Take 1 tablet by mouth daily.   Yes [provider]  olmesartan (BENICAR) 40 MG tablet TAKE ONE TABLET ONCE DAILY 06/30/22  Yes [name] Lima, MD  oxyCODONE (OXY IR/ROXICODONE) 5 MG immediate release tablet Take 1 tablet (5 mg total) by mouth every 4 (four) hours as needed for severe pain. 08/27/22  Yes Truitt Merle, MD  polyethylene glycol powder (GLYCOLAX/MIRALAX) 17 GM/SCOOP powder Take 17 g (1 capful) by mouth daily as directed. 06/05/21  Yes [name] Portela, MD  predniSONE (DELTASONE) 5 MG tablet TAKE 1 TABLET 2 TIMES A DAY 06/03/22  Yes Shadad, Mathis Dad, MD  sodium chloride 1 g tablet Take 1 g by mouth 2 (two) times daily with a meal.   Yes [provider]  solifenacin (VESICARE) 5 MG tablet Take 5 mg by mouth daily. 08/11/19  Yes [provider]  Specialty  Vitamins Products (VISTA ADVANCED CAROTENOID) CAPS Take 1 capsule by mouth every evening.   Yes [provider]  vitamin C (ASCORBIC ACID) 500 MG tablet Take 500 mg by mouth every evening.   Yes [provider]  vitamin E 180 MG (400 UNITS) capsule Take 400 Units by mouth every evening.   Yes [provider]  diazepam (VALIUM) 2 MG tablet Take 1 tablet (2 mg total) by mouth every 8 (eight) hours as needed for anxiety. 09/02/22   [name] Lima, MD  lubiprostone (AMITIZA) 24 MCG capsule Take 1 capsule (24 mcg total) by mouth 2 (two) times daily with a meal. 09/02/22   [name] Lima, MD  [facility] 4 MG/0.1ML LIQD nasal spray kit Place 0.4 mg into the nose once as needed (overdose).  08/18/18   [provider]    Physical Exam: Vitals:   09/03/22 1315 09/03/22 1345 09/03/22 1424 09/03/22 1442  BP: 107/60 109/89    Pulse: 89 93    Resp: 20 (!) 21    Temp:   99.6 F (37.6 C)   TempSrc:   Oral   SpO2: 98% 100%  100%  Weight:      Height:       Physical Exam Vitals and nursing note reviewed.  Constitutional:      Appearance: He is well-developed.  HENT:     Head: Normocephalic.     Nose: No rhinorrhea.     Mouth/Throat:     Mouth: Mucous membranes are moist.  Eyes:     General: No scleral icterus.    Pupils: Pupils are equal, round, and reactive to light.  Neck:     Vascular: No JVD.  Cardiovascular:     Rate and Rhythm: Normal rate and regular rhythm.  Pulmonary:     Effort: Pulmonary effort is normal. No tachypnea, bradypnea or accessory muscle usage.     Breath sounds: Examination of the right-lower field reveals decreased breath sounds and rales. Decreased breath sounds, wheezing, rhonchi and rales present.  Abdominal:     General: Bowel sounds are normal.     Palpations: Abdomen is soft.  Musculoskeletal:  Cervical back: Neck supple.     Right lower leg: No edema.     Left lower leg: No edema.  Skin:    General: Skin is warm and dry.   Neurological:     General: No focal deficit present.     Mental Status: He is alert and oriented to person, place, and time.  Psychiatric:        Mood and Affect: Mood normal.        Behavior: Behavior normal.     Data Reviewed:  Results are pending, will review when available.  Assessment and Plan: Principal Problem:   Sepsis ([place]) Due to:   CAP (community acquired pneumonia) Superimposed on:   Asthma-COPD overlap syndrome Admit to PCU/inpatient. Continue supplemental oxygen. Continue IV fluids. Follow-up lactic acid level. Scheduled and as needed bronchodilators. Continue ceftriaxone 1 g IVPB daily. Continue azithromycin 500 mg IVPB daily. Single dose methylprednisolone 40 mg IVP. Increase physiological prednisone to 10 mg daily x 3 days. Check strep pneumoniae urinary antigen. Check sputum Gram stain, culture and sensitivity. Follow-up blood culture and sensitivity. Follow-up CBC and chemistry in the morning.  Active Problems:   Essential hypertension Hold furosemide for now. Continue olmesartan 40 mg p.o. daily.    Prostate cancer [facility])   Secondary malignant neoplasm of bone and bone marrow ([place]) Hide whole Zytiga until the patient recovers from pneumonia. Follow-up with [name]. Alen [facility] at the cancer [facility].    Class 2 obesity Current BMI 35.71 kg/m. Continue lifestyle modifications. Follow-up with PCP.    Hyperlipidemia with target LDL less than 100 Currently not on medical therapy. Follow-up with primary.    Advance Care Planning:   Code Status: Full Code   Consults:   Family Communication:   Severity of Illness: The appropriate patient status for this patient is INPATIENT. Inpatient status is judged to be reasonable and necessary in order to provide the required intensity of service to ensure the patient's safety. The patient's presenting symptoms, physical exam findings, and initial radiographic and laboratory data in the context of their chronic  comorbidities is felt to place them at high risk for further clinical deterioration. Furthermore, it is not anticipated that the patient will be medically stable for discharge from the hospital within 2 midnights of admission.   * I certify that at the point of admission it is my clinical judgment that the patient will require inpatient hospital care spanning beyond 2 midnights from the point of admission due to high intensity of service, high risk for further deterioration and high frequency of surveillance required.*  Author: Reubin Milan, MD 09/03/2022 3:29 PM  For on call review [email or web address].   This document was prepared using Dragon voice recognition software and may contain some unintended transcription errors.

## 2022-09-03 NOTE — ED Notes (Signed)
Unable to get second line or second blood draw for blood cultures. Charge notified

## 2022-09-03 NOTE — ED Provider Notes (Signed)
[facility] Provider Note   CSN: [id number] Arrival date & time: 09/03/22  1135     History  Chief Complaint  Patient presents with   Weakness   Shortness of Breath    [name] Holder is a 68 y.o. male.  Patient here with cough, may be fever overnight.  History of prostate cancer on immunotherapy.  Has had some shortness of breath symptoms.  History of aspiration pneumonia at that he may be aspirated on some sputum last night.  Denies any active chest pain or shortness of breath.  He has been fatigued here the last day or 2.  Been dealing with some constipation but no abdominal pain.  He has had issues with his sodium in the past and wonder if that might be going on.  He is concerned maybe for infectious process.  No recent surgery or travel or blood clot history.  No history of major coronary artery disease.  History of COPD.  Patient is on chronic steroids.  The history is provided by the patient.       Home Medications Prior to Admission medications   Medication Sig Start Date End Date Taking? Authorizing Provider  abiraterone acetate (ZYTIGA) 250 MG tablet Take 4 tablets (1,000 mg total) by mouth daily. 10/24/21   Wyatt Portela, MD  acetaminophen (TYLENOL) 325 MG tablet Take 325 mg by mouth daily as needed for fever.    [provider]  albuterol (PROVENTIL) (2.5 MG/3ML) 0.083% nebulizer solution Take 3 mLs (2.5 mg total) by nebulization every 6 (six) hours as needed for wheezing or shortness of breath. 05/11/22   Icard, Octavio Graves, DO  albuterol (VENTOLIN HFA) 108 (90 Base) MCG/ACT inhaler Inhale 2 puffs into the lungs every 6 (six) hours as needed for wheezing or shortness of breath. 05/11/22   Icard, Octavio Graves, DO  azelastine (ASTELIN) 0.1 % nasal spray Place 2 puffs into the nose in the morning and at bedtime. 12/24/21   [name], MD  CALCIUM-VITAMIN D PO Take 1 tablet by mouth 2 (two) times daily.     [provider]  carvedilol (COREG) 3.125 MG tablet TAKE 1 TABLET 2 TIMES A DAY WITH BREAKFAST & SUPPER 05/05/22   [name], MD  denosumab (PROLIA) 60 MG/ML SOSY injection Inject 60 mg into the skin every 6 (six) months.    [provider]  diazepam (VALIUM) 2 MG tablet Take 1 tablet (2 mg total) by mouth every 8 (eight) hours as needed for anxiety. 09/02/22   [name], MD  Famotidine (PEPCID AC PO) Take by mouth.    [provider]  fluticasone (FLONASE) 50 MCG/ACT nasal spray Place 2 sprays into both nostrils daily. 01/06/22   [name], MD  Fluticasone-Umeclidin-Vilant (TRELEGY ELLIPTA) 100-62.5-25 MCG/ACT AEPB Inhale 1 puff into the lungs daily. 05/11/22   Icard, Octavio Graves, DO  furosemide (LASIX) 20 MG tablet TAKE ONE TABLET TWICE DAILY 08/29/22   [name], MD  leuprolide (LUPRON) 11.25 MG injection Inject 11.25 mg into the muscle every 3 (three) months.    [provider]  Leuprolide Acetate (LUPRON IJ) Inject 1 Dose as directed every 4 (four) months. This is administered by [facility]    [provider]  lubiprostone (AMITIZA) 24 MCG capsule Take 1 capsule (24 mcg total) by mouth 2 (two) times daily with a meal. 09/02/22   [name], MD  meclizine (ANTIVERT) 25 MG  tablet Take 1 tablet (25 mg total) by mouth 3 (three) times daily as needed for dizziness. 06/02/22   [name], MD  Multiple Vitamins-Minerals (CENTRUM SILVER 50+MEN PO) Take 1 tablet by mouth daily.    [provider]  NARCAN 4 MG/0.1ML LIQD nasal spray kit Place 0.4 mg into the nose once as needed (overdose).  08/18/18   [provider]  olmesartan (BENICAR) 40 MG tablet TAKE ONE TABLET ONCE DAILY 06/30/22   [name], MD  oxyCODONE (OXY IR/ROXICODONE) 5 MG immediate release tablet Take 1 tablet (5 mg total) by mouth every 4 (four) hours as needed for severe pain. 08/27/22   Truitt Merle, MD  polyethylene glycol powder (GLYCOLAX/MIRALAX) 17  GM/SCOOP powder Take 17 g (1 capful) by mouth daily as directed. 06/05/21   Wyatt Portela, MD  predniSONE (DELTASONE) 5 MG tablet TAKE 1 TABLET 2 TIMES A DAY 06/03/22   Wyatt Portela, MD  sodium chloride 1 g tablet Take 1 g by mouth 2 (two) times daily with a meal.    [provider]  solifenacin (VESICARE) 5 MG tablet Take 5 mg by mouth daily. 08/11/19   [provider]  Specialty Vitamins Products (VISTA ADVANCED CAROTENOID) CAPS Take 1 capsule by mouth every evening.    [provider]  vitamin C (ASCORBIC ACID) 500 MG tablet Take 500 mg by mouth every evening.    [provider]  vitamin E 180 MG (400 UNITS) capsule Take 400 Units by mouth every evening.    [provider]      Allergies    Amlodipine and Tetracycline    Review of Systems   Review of Systems  Physical Exam Updated Vital Signs BP 107/60   Pulse 89   Temp (!) 97.4 F (36.3 C) (Temporal)   Resp 20   Ht '5\' 7"'$  (1.702 m)   Wt 103.4 kg   SpO2 98%   BMI 35.71 kg/m  Physical Exam Vitals and nursing note reviewed.  Constitutional:      General: He is not in acute distress.    Appearance: He is well-developed. He is not ill-appearing.  HENT:     Head: Normocephalic and atraumatic.  Eyes:     Extraocular Movements: Extraocular movements intact.     Conjunctiva/sclera: Conjunctivae normal.     Pupils: Pupils are equal, round, and reactive to light.  Cardiovascular:     Rate and Rhythm: Normal rate and regular rhythm.     Pulses: Normal pulses.     Heart sounds: Normal heart sounds. No murmur heard. Pulmonary:     Effort: Pulmonary effort is normal. No respiratory distress.     Breath sounds: Normal breath sounds. No decreased breath sounds or wheezing.  Abdominal:     Palpations: Abdomen is soft.     Tenderness: There is no abdominal tenderness.  Musculoskeletal:        General: No swelling.     Cervical back: Normal range of motion and neck supple.     Right  lower leg: No edema.     Left lower leg: No edema.  Skin:    General: Skin is warm and dry.     Capillary Refill: Capillary refill takes less than 2 seconds.  Neurological:     General: No focal deficit present.     Mental Status: He is alert.  Psychiatric:        Mood and Affect: Mood normal.     ED  Results / Procedures / Treatments   Labs (all labs ordered are listed, but only abnormal results are displayed) Labs Reviewed  CBC WITH DIFFERENTIAL/PLATELET - Abnormal; Notable for the following components:      Result Value   WBC 25.6 (*)    Neutro Abs 22.5 (*)    Monocytes Absolute 1.1 (*)    Abs Immature Granulocytes 0.14 (*)    All other components within normal limits  COMPREHENSIVE METABOLIC PANEL - Abnormal; Notable for the following components:   CO2 19 (*)    Glucose, Bld 121 (*)    Creatinine, Ser 1.27 (*)    Anion gap 17 (*)    All other components within normal limits  RESP PANEL BY RT-PCR (RSV, FLU A&B, COVID)  RVPGX2  CULTURE, BLOOD (ROUTINE X 2)  CULTURE, BLOOD (ROUTINE X 2)  BRAIN NATRIURETIC PEPTIDE  LACTIC ACID, PLASMA  LACTIC ACID, PLASMA  TROPONIN I (HIGH SENSITIVITY)    EKG EKG Interpretation  Date/Time:  Thursday September 03 2022 12:00:56 EST Ventricular Rate:  93 PR Interval:  159 QRS Duration: 97 QT Interval:  360 QTC Calculation: 448 R Axis:   43 Text Interpretation: Sinus rhythm Confirmed by Lennice Sites ([id number]) on 09/03/2022 12:12:43 PM  Radiology DG Chest Portable 1 View  Result Date: 09/03/2022 CLINICAL DATA:  Shortness of breath EXAM: PORTABLE CHEST 1 VIEW COMPARISON:  04/15/2022 FINDINGS: The heart size and mediastinal contours are within normal limits. Patchy airspace opacities in the periphery of the right lower lobe. Left basilar scarring or atelectasis. No pneumothorax. The visualized skeletal structures are unremarkable. IMPRESSION: Patchy airspace opacities in the periphery of the right lower lobe, suspicious for pneumonia.  Radiographic follow-up to resolution is recommended. Electronically Signed   By: Davina Poke D.O.   On: 09/03/2022 12:36    Procedures .Critical Care  Performed by: Lennice Sites, DO Authorized by: Lennice Sites, DO   Critical care provider statement:    Critical care time (minutes):  35   Critical care was necessary to treat or prevent imminent or life-threatening deterioration of the following conditions:  Sepsis   Critical care was time spent personally by me on the following activities:  Blood draw for specimens, development of treatment plan with patient or surrogate, discussions with primary provider, examination of patient, evaluation of patient's response to treatment, obtaining history from patient or surrogate, ordering and performing treatments and interventions, ordering and review of laboratory studies, ordering and review of radiographic studies, pulse oximetry, re-evaluation of patient's condition and review of old charts   Care discussed with: admitting provider       Medications Ordered in ED Medications  sodium chloride 0.9 % bolus 1,000 mL (has no administration in time range)  cefTRIAXone (ROCEPHIN) 2 g in sodium chloride 0.9 % 100 mL IVPB (has no administration in time range)  azithromycin (ZITHROMAX) 500 mg in sodium chloride 0.9 % 250 mL IVPB (has no administration in time range)    ED Course/ Medical Decision Making/ A&P                             Medical Decision Making Amount and/or Complexity of Data Reviewed Labs: ordered. Radiology: ordered.  Risk Decision regarding hospitalization.   THORIN STARNER is here with weakness and shortness of breath and cough.  Normal vitals.  No fever.  Had a fever overnight.  Patient with history of prostate cancer on immunotherapy.  History of COPD.  Overall he is well-appearing.  Clear breath sounds.  No signs of volume overload.  EKG per my review interpretation shows sinus rhythm.  No ischemic changes.  He  saw primary care doctor yesterday for some anxiety symptoms and was given prescription for antianxiety meds.  He has been given some medication to help with constipation.  Differential diagnosis includes pneumonia versus less likely ACS.  He does have cancer history but he is not hypoxic he is not tachypneic and I have very low suspicion for PE.  Per primary care doctor's note yesterday has been dealing with a lot of panic attack and anxiety's and it is possible that this is part of what he is feeling today as well.  Vital signs are reassuring.  There is this fever that occurred overnight.  He has not had any antipyretics 6 hours.  He does not appear to be septic.  Possible there is a pneumonia.  Will get CBC, CMP, BNP, troponin, chest x-ray to further evaluate.  Overall story not consistent with ACS.  Could be viral process as well.  Per my review and interpretation labs patient with white count of 25.  Bicarb is 19, anion gap slightly elevated to 17.  Suspect that this is from a mild lactic acidosis.  COVID and flu test are negative.  Troponin and BNP are unremarkable.  Chest x-ray per my review and interpretation shows pneumonia.  Overall suspect pneumonia cause of his symptoms.  He had a fever overnight at home.  Overall concern for sepsis given that he is immunocompromise as well on chronic steroids and some immunotherapy.  He has been too weak to walk this morning and believe he did benefit from IV antibiotics, IV fluids and further sepsis rule out.  To be admitted to medicine for further care.  This chart was dictated using voice recognition software.  Despite best efforts to proofread,  errors can occur which can change the documentation meaning.         Final Clinical Impression(s) / ED Diagnoses Final diagnoses:  Sepsis, due to unspecified organism, unspecified whether acute organ dysfunction present [facility])  Community acquired pneumonia, unspecified laterality    Rx / DC Orders ED  Discharge Orders     None         Lennice Sites, DO 09/03/22 1345

## 2022-09-03 NOTE — ED Notes (Signed)
Was giving pt care, getting blood cultures starting second line, giving antibiotics and when [facility]link arrived.

## 2022-09-03 NOTE — Sepsis Progress Note (Signed)
Sepsis protocol monitored by eLink ?

## 2022-09-03 NOTE — Progress Notes (Signed)
Plan of Care Note for accepted transfer   Patient: [name] Holder MRN: [id number]   DOA: 09/03/2022  Facility requesting transfer: [facility] Requesting Provider: Lennice Sites, DO. Reason for transfer: Sepsis due to CAP. Facility course:  Per Dr. Ronnald Nian: " Chief Complaint  Patient presents with   Weakness   Shortness of Breath  [name] Holder is a 67 y.o. male.   Patient here with cough, may be fever overnight.  History of prostate cancer on immunotherapy.  Has had some shortness of breath symptoms.  History of aspiration pneumonia at that he may be aspirated on some sputum last night.  Denies any active chest pain or shortness of breath.  He has been fatigued here the last day or 2.  Been dealing with some constipation but no abdominal pain.  He has had issues with his sodium in the past and wonder if that might be going on.  He is concerned maybe for infectious process.  No recent surgery or travel or blood clot history.  No history of major coronary artery disease.  History of COPD.  Patient is on chronic steroids."  Labwork:  Resp panel by RT-PCR (RSV, Flu A&B, Covid) Anterior Nasal Swab [[id number]]   Collected: 09/03/22 1200   Updated: 09/03/22 1336   Specimen Source: Anterior Nasal Swab    SARS Coronavirus 2 by RT PCR NEGATIVE   Influenza A by PCR NEGATIVE   Influenza B by PCR NEGATIVE   Resp Syncytial Virus by PCR NEGATIVE  Comprehensive metabolic panel [[id number]] (Abnormal)   Collected: 09/03/22 1200   Updated: 09/03/22 1331   Specimen Type: Blood   Specimen Source: Vein    Sodium 135 mmol/L   Potassium 4.0 mmol/L   Chloride 99 mmol/L   CO2 19 Low  mmol/L   Glucose, Bld 121 High  mg/dL   BUN 23 mg/dL   Creatinine, Ser 1.27 High  mg/dL   Calcium 9.1 mg/dL   Total Protein 6.7 g/dL   Albumin 4.1 g/dL   AST 24 U/L   ALT 24 U/L   Alkaline Phosphatase 49 U/L   Total Bilirubin 0.7 mg/dL   GFR, Estimated >60 mL/min   Anion gap 17 High   CBC with Differential  [[id number]] (Abnormal)   Collected: 09/03/22 1200   Updated: 09/03/22 1307   Specimen Type: Blood   Specimen Source: Vein    WBC 25.6 High  K/uL   RBC 4.74 MIL/uL   Hemoglobin 14.0 g/dL   HCT 40.8 %   MCV 86.1 fL   MCH 29.5 pg   MCHC 34.3 g/dL   RDW 13.6 %   Platelets 291 K/uL   nRBC 0.0 %   Neutrophils Relative % 88 %   Neutro Abs 22.5 High  K/uL   Lymphocytes Relative 7 %   Lymphs Abs 1.8 K/uL   Monocytes Relative 4 %   Monocytes Absolute 1.1 High  K/uL   Eosinophils Relative 0 %   Eosinophils Absolute 0.1 K/uL   Basophils Relative 0 %   Basophils Absolute 0.1 K/uL   WBC Morphology MORPHOLOGY UNREMARKABLE   RBC Morphology MORPHOLOGY UNREMARKABLE   Smear Review Normal platelet morphology   Immature Granulocytes 1 %   Abs Immature Granulocytes 0.14 High  K/uL  Brain natriuretic peptide [[id number]]   Collected: 09/03/22 1200   Updated: 09/03/22 1252   Specimen Type: Blood   Specimen Source: Vein    B Natriuretic Peptide 79.2 pg/mL  Troponin I (High Sensitivity) [[id number]]   Collected: 09/03/22  1200   Updated: 09/03/22 1238    Troponin I (High Sensitivity) 9 ng/L   Imaging:  CLINICAL DATA:  Shortness of breath EXAM: PORTABLE CHEST 1 VIEW COMPARISON:  04/15/2022   FINDINGS: The heart size and mediastinal contours are within normal limits. Patchy airspace opacities in the periphery of the right lower lobe. Left basilar scarring or atelectasis. No pneumothorax. The visualized skeletal structures are unremarkable.   IMPRESSION: Patchy airspace opacities in the periphery of the right lower lobe, suspicious for pneumonia. Radiographic follow-up to resolution is recommended.   Electronically Signed   By: Davina Poke D.O.   On: 09/03/2022 12:36   Plan of care: The patient is accepted for admission to [place]  unit, at [facility].  Author: Reubin Milan, MD 09/03/2022  Check [email or web address] for on-call coverage.  Nursing staff, Please call  [place] number on Amion as soon as patient's arrival, so appropriate admitting provider can evaluate the pt.

## 2022-09-03 NOTE — Progress Notes (Signed)
Date and time results received: 09/03/22 at [place] (use smartphrase ".now" to insert current time)  Test: lacic acid Critical Value: 2.1  Name of Provider Notified: Reubin Milan MD  Orders Received? Or Actions Taken?: waiting for response.

## 2022-09-03 NOTE — ED Notes (Signed)
Report given to [facility] 

## 2022-09-03 NOTE — ED Notes (Signed)
George at [facility] called for transport to [facility][id number].-ABB(NS)

## 2022-09-03 NOTE — ED Notes (Signed)
Pt had been taken off ED board and placed in [place] while this RN was still giving pt care here at [facility]. Called [facility] and they confirmed they had them on their board

## 2022-09-03 NOTE — ED Notes (Signed)
ED Provider at bedside. 

## 2022-09-03 NOTE — ED Notes (Signed)
Called to give report and was asked to call back in 5 minutes. Informed RN that [facility] was on the way but will call back in 5  min

## 2022-09-03 NOTE — ED Notes (Signed)
Azithromycin in  new line in right [facility]

## 2022-09-03 NOTE — ED Triage Notes (Signed)
Pt arrived POV, stated he woke up with a cough and aspirated last night on sputum. Stopped on the way here to use the restroom and his feet came out from under him and couldn't stand up.

## 2022-09-04 ENCOUNTER — Inpatient Hospital Stay (HOSPITAL_COMMUNITY): Payer: Medicare Other

## 2022-09-04 ENCOUNTER — Telehealth: Payer: Self-pay

## 2022-09-04 DIAGNOSIS — E669 Obesity, unspecified: Secondary | ICD-10-CM

## 2022-09-04 DIAGNOSIS — R55 Syncope and collapse: Secondary | ICD-10-CM

## 2022-09-04 DIAGNOSIS — C61 Malignant neoplasm of prostate: Secondary | ICD-10-CM

## 2022-09-04 DIAGNOSIS — J189 Pneumonia, unspecified organism: Secondary | ICD-10-CM | POA: Diagnosis not present

## 2022-09-04 DIAGNOSIS — J4489 Other specified chronic obstructive pulmonary disease: Secondary | ICD-10-CM | POA: Diagnosis not present

## 2022-09-04 DIAGNOSIS — A419 Sepsis, unspecified organism: Secondary | ICD-10-CM | POA: Diagnosis not present

## 2022-09-04 DIAGNOSIS — E785 Hyperlipidemia, unspecified: Secondary | ICD-10-CM

## 2022-09-04 DIAGNOSIS — C7952 Secondary malignant neoplasm of bone marrow: Secondary | ICD-10-CM

## 2022-09-04 DIAGNOSIS — C7951 Secondary malignant neoplasm of bone: Secondary | ICD-10-CM

## 2022-09-04 LAB — CBC WITH DIFFERENTIAL/PLATELET
Abs Immature Granulocytes: 0.15 10*3/uL — ABNORMAL HIGH (ref 0.00–0.07)
Basophils Absolute: 0 10*3/uL (ref 0.0–0.1)
Basophils Relative: 0 %
Eosinophils Absolute: 0 10*3/uL (ref 0.0–0.5)
Eosinophils Relative: 0 %
HCT: 38 % — ABNORMAL LOW (ref 39.0–52.0)
Hemoglobin: 12.3 g/dL — ABNORMAL LOW (ref 13.0–17.0)
Immature Granulocytes: 1 %
Lymphocytes Relative: 5 %
Lymphs Abs: 0.9 10*3/uL (ref 0.7–4.0)
MCH: 29.2 pg (ref 26.0–34.0)
MCHC: 32.4 g/dL (ref 30.0–36.0)
MCV: 90.3 fL (ref 80.0–100.0)
Monocytes Absolute: 0.4 10*3/uL (ref 0.1–1.0)
Monocytes Relative: 2 %
Neutro Abs: 16.9 10*3/uL — ABNORMAL HIGH (ref 1.7–7.7)
Neutrophils Relative %: 92 %
Platelets: 246 10*3/uL (ref 150–400)
RBC: 4.21 MIL/uL — ABNORMAL LOW (ref 4.22–5.81)
RDW: 13.5 % (ref 11.5–15.5)
WBC: 18.4 10*3/uL — ABNORMAL HIGH (ref 4.0–10.5)
nRBC: 0 % (ref 0.0–0.2)

## 2022-09-04 LAB — ECHOCARDIOGRAM COMPLETE
AR max vel: 1.67 cm2
AV Area VTI: 1.8 cm2
AV Area mean vel: 1.67 cm2
AV Mean grad: 6 mmHg
AV Peak grad: 9.1 mmHg
Ao pk vel: 1.51 m/s
Area-P 1/2: 4.57 cm2
Height: 67 in
S' Lateral: 2.4 cm
Weight: 3648 oz

## 2022-09-04 LAB — COMPREHENSIVE METABOLIC PANEL
ALT: 21 U/L (ref 0–44)
AST: 23 U/L (ref 15–41)
Albumin: 3.3 g/dL — ABNORMAL LOW (ref 3.5–5.0)
Alkaline Phosphatase: 43 U/L (ref 38–126)
Anion gap: 9 (ref 5–15)
BUN: 14 mg/dL (ref 8–23)
CO2: 19 mmol/L — ABNORMAL LOW (ref 22–32)
Calcium: 8.2 mg/dL — ABNORMAL LOW (ref 8.9–10.3)
Chloride: 106 mmol/L (ref 98–111)
Creatinine, Ser: 0.75 mg/dL (ref 0.61–1.24)
GFR, Estimated: >60 mL/min (ref >60)
Glucose, Bld: 139 mg/dL — ABNORMAL HIGH (ref 70–99)
Potassium: 3.8 mmol/L (ref 3.5–5.1)
Sodium: 134 mmol/L — ABNORMAL LOW (ref 135–145)
Total Bilirubin: 0.5 mg/dL (ref 0.3–1.2)
Total Protein: 6.5 g/dL (ref 6.5–8.1)

## 2022-09-04 LAB — LACTIC ACID, PLASMA
Lactic Acid, Venous: 3.4 mmol/L (ref 0.5–1.9)
Lactic Acid, Venous: 2 mmol/L (ref 0.5–1.9)

## 2022-09-04 MED ORDER — LEVALBUTEROL HCL 0.63 MG/3ML IN NEBU
0.6300 mg | INHALATION_SOLUTION | Freq: Four times a day (QID) | RESPIRATORY_TRACT | Status: DC
  Administered 2022-09-04 – 2022-09-05 (×4): 0.63 mg via RESPIRATORY_TRACT
  Filled 2022-09-04 (×6): qty 3

## 2022-09-04 MED ORDER — IPRATROPIUM BROMIDE 0.02 % IN SOLN
0.5000 mg | Freq: Four times a day (QID) | RESPIRATORY_TRACT | Status: DC
  Administered 2022-09-04 – 2022-09-05 (×4): 0.5 mg via RESPIRATORY_TRACT
  Filled 2022-09-04 (×4): qty 2.5

## 2022-09-04 MED ORDER — PERFLUTREN LIPID MICROSPHERE
1.0000 mL | INTRAVENOUS | Status: AC | PRN
  Administered 2022-09-04: 2 mL via INTRAVENOUS

## 2022-09-04 MED ORDER — GUAIFENESIN ER 600 MG PO TB12
1200.0000 mg | ORAL_TABLET | Freq: Two times a day (BID) | ORAL | Status: DC
  Administered 2022-09-04 – 2022-09-06 (×5): 1200 mg via ORAL
  Filled 2022-09-04 (×5): qty 2

## 2022-09-04 MED ORDER — SODIUM CHLORIDE 0.9 % IV BOLUS
1000.0000 mL | Freq: Once | INTRAVENOUS | Status: AC
  Administered 2022-09-04: 1000 mL via INTRAVENOUS

## 2022-09-04 NOTE — Progress Notes (Signed)
Mobility Specialist - Progress Note   09/04/22 1010  Mobility  Activity Ambulated with assistance in hallway  Level of Assistance Standby assist, set-up cues, supervision of patient - no hands on  Assistive Device Front wheel walker  Distance Ambulated (ft) 330 ft  Activity Response Tolerated well  Mobility Referral Yes  $Mobility charge 1 Mobility   Pt received in recliner and agreeable to mobility. Pt had some SOB throughout ambulation, requiring 2x (~30sec) standing rest breaks. O2 checked & 97%. No other complaints during session. Encouraged pursed lip breathing after ambulating due to pt still feeling SOB. Pt to recliner after session with all needs met & Xray team in room.   During mobility: 95 HR, 99% SpO2 (RA) Post-mobility: 100 HR, 97% SPO2 (RA)  Set designer

## 2022-09-04 NOTE — Progress Notes (Signed)
Lab called with critical value - Lactic Acid was 3.4. Messaged doctor who ordered a bolus.

## 2022-09-04 NOTE — Progress Notes (Signed)
Mobility Specialist - Progress Note   09/04/22 1626  Mobility  Activity Ambulated with assistance in hallway (Seated UE Exercises)  Level of Assistance Independent  Assistive Device None;Front wheel walker  Distance Ambulated (ft) 250 ft  Range of Motion/Exercises Active;Right arm;Left arm  Activity Response Tolerated well  Mobility Referral Yes  $Mobility charge 1 Mobility   Pt received in bed and agreeable to mobility & UE exercises after ambulating. No complaints during session. Pt to bed after session with all needs met.    Seated ULE Exercises: 5 reps each arm, Yellow Resistance band  1) Elbow Flexion  2) Elbow Extension   3) Shoulder Horizontal Abduction  4) Diagonal Reach  5) Forward Punches    [facility]

## 2022-09-04 NOTE — Progress Notes (Signed)
PROGRESS NOTE    [name] Holder  [id number] DOB: Sep 18, 1955 DOA: 09/03/2022 PCP: [name], MD   Brief Narrative:  The patient is a 67 year old Caucasian obese male with past medical history significant for but not limited to allergic rhinitis, prostate cancer and bladder cancer, chronic constipation, chronic lower back pain with a history of vertebral compression fracture T12 and T10, urolithiasis, history of pneumonia, history of colonic polyps, BPH, hypertension, mild persistent asthma, osteopenia as well as other comorbidities who presented to the ED with generalized weakness associated with the right lower lobe pleuritic chest pain, dyspnea cough as well as wheezing, fatigue, chills and fever since yesterday morning.  Patient thinks he may have aspirated sputum earlier in the day and he denies any rhinorrhea discomfort or hemoptysis.  No other complaints.  Given his weakness and shortness of breath he presented to the ED.  Patient also states that he had a fall yesterday and does not know how he "fell and did not know if he passed out".  Upon arrival to the ED his CBC showed a white blood cell count 25,000 hemoglobin 14.0.  Troponin and BNP were normal.  Coronavirus, influenza AMB as well as RSV PCR was negative.  Chest x-ray was done which showed patchy airspace opacities in the periphery of the right lower lobe suspicious for pneumonia.  He is saturating well on room air and was admitted for evaluation and management of sepsis secondary to community-acquired pneumonia/right lower lobe pneumonia in setting of asthma and COPD  Assessment and Plan: No notes have been filed under this hospital service. Service: Hospitalist  Sepsis [facility]) due to CAP (community acquired pneumonia) superimposed on Asthma-COPD overlap syndrome -Admit to PCU/inpatient. -Met sepsis criteria on admission given that he was tachypneic, had a leukocytosis and also had a source of infection -Continue supplemental  oxygen. -SpO2: 98 % -Continue IV fluids as below -Follow-up lactic acid level. -C/w Scheduled and as needed bronchodilators. -Continue Ceftriaxone 1 g IVPB daily and Continue Azithromycin 500 mg IVPB daily consider changing to IV Unasyn for aspiration -Single dose methylprednisolone 40 mg IVP. -C/w prednisone to 10 mg daily x 3 days. -Check strep pneumoniae urinary antigen. -Check sputum Gram stain, culture and sensitivity. -Repeat CXR in the AM; Repeat CXR done and showed "Little significant change in patchy opacities within the mid and LOWER RIGHT lung and subsegmental atelectasis/scarring within the LEFT LOWER lung." -Follow-up blood culture and sensitivity. -Follow-up CBC and chemistry in the morning.   Essential Hypertension -Hold Furosemide for now. -Continue Olmesartan 40 mg p.o. daily substitution  -Continue to Monitor BP per Protocol -Last BP reading was 133/76   Prostate cancer (HCC) Secondary malignant neoplasm of bone and bone marrow (HCC) -Hide whole Zytiga until the patient recovers from pneumonia. -Follow-up with new oncologist now that Dr. Alen Blew is no longer at the cancer center.   Lactic Acidosis -In the setting of infection -Patient's lactic acid level went from 2.5 -> 2.1 -> 3.4 -Will give him another fluid bolus of 1 L and then continue maintenance IV fluid at 75 MLS per hour -Follow the trend of lactic acid level  Hyperlipidemia with target LDL less than 100 -Currently not on medical therapy. -Follow-up with primary.  Fall with ? Etiology possibly Syncope -Patient states that prior to admission he had a fall and does not know how he fell.  Unclear etiology if this is related to syncope so we will get an echocardiogram -Obtain PT and OT to further evaluate and  treat and check orthostatics -Check TSH  AKI -BUN/Cr Trend: Recent Labs  Lab 08/11/22 1109 09/03/22 1200 09/04/22 0406  BUN 16 23 14  $ CREATININE 0.84 1.27* 0.75  -Improved with IVF  Hydration with NS at 100 mL/hr -Avoid Nephrotoxic Medications, Contrast Dyes, Hypotension and Dehydration to Ensure Adequate Renal Perfusion and will need to Renally Adjust Meds -Continue to Monitor and Trend Renal Function carefully and repeat CMP in the AM   Normocytic Anemia  -? Hemoconcentration on Admission and Dilutional Drop -Hgb/Hct Trend: Recent Labs  Lab 08/11/22 1109 09/03/22 1200 09/04/22 0406  HGB 12.6* 14.0 12.3*  HCT 36.5* 40.8 38.0*  MCV 87.3 86.1 90.3  -Check Anemia Panel in the AM -Continue to Monitor for S/Sx of Bleeding: No overt bleeding noted -Repeat CBC in the AM   Metabolic Acidosis -Mild. Patient's CO2 is 19, AG is 9, Chloride Level is 106 -Continue to Monitor and Trend and Repeat CMP in the AM   Hypoalbuminemia -Patient's Albumin Trend: Recent Labs  Lab 08/11/22 1109 09/03/22 1200 09/04/22 0406  ALBUMIN 3.9 4.1 3.3*  -Continue to Monitor and Trend and repeat CMP in the AM  Class 2 Obesity -Complicates overall prognosis and care -Estimated body mass index is 35.71 kg/m as calculated from the following:   Height as of this encounter: 5' 7"$  (1.702 m).   Weight as of this encounter: 103.4 kg.  -Weight Loss and Dietary Counseling given  DVT prophylaxis: Enoxaparin 50 mg sq q24h    Code Status: Full Code Family Communication: No family currently at bedside  Disposition Plan:  Level of care: Med-Surg Status is: Inpatient Remains inpatient appropriate because: Was a clinical improvement prior to safe discharge disposition and further workup and evaluation   Consultants:  None  Procedures:  Echocardiogram has been ordered  Antimicrobials:  Anti-infectives (From admission, onward)    Start     Dose/Rate Route Frequency Ordered Stop   09/03/22 1345  cefTRIAXone (ROCEPHIN) 2 g in sodium chloride 0.9 % 100 mL IVPB        2 g 200 mL/hr over 30 Minutes Intravenous Every 24 hours 09/03/22 1341 09/08/22 1459   09/03/22 1345  azithromycin  (ZITHROMAX) 500 mg in sodium chloride 0.9 % 250 mL IVPB        500 mg 250 mL/hr over 60 Minutes Intravenous Every 24 hours 09/03/22 1341 09/08/22 1459       Subjective: Seen and examined at bedside and he was sitting in the chair and felt a little bit better.  Is that he may have aspirated earlier.  States that he does not know why he fell and was on the floor.  No lightheadedness or dizziness.  No other concerns or complaints at this time.  Objective: Vitals:   09/03/22 1819 09/03/22 2131 09/04/22 0126 09/04/22 0616  BP: (!) 117/55 (!) 141/81 (!) 141/101 (!) 142/91  Pulse: 92 89 85 86  Resp: 16 18 18 18  $ Temp: 98.2 F (36.8 C) 98.2 F (36.8 C) (!) 97.5 F (36.4 C) 97.9 F (36.6 C)  TempSrc: Oral     SpO2: 96% 97% 97% 95%  Weight:      Height:        Intake/Output Summary (Last 24 hours) at 09/04/2022 0803 Last data filed at 09/04/2022 [id number] Gross per 24 hour  Intake 4161.5 ml  Output 1950 ml  Net 2211.5 ml   Filed Weights   09/03/22 1142  Weight: 103.4 kg   Examination: Physical Exam:  Constitutional: WN/WD  obese Caucasian male in NAD Respiratory: Diminished to auscultation bilaterally with coarse breath sounds worse on the right compared to left and does have some slight rhonchi and crackles.  No appreciable wheezing or rales. Normal respiratory effort and patient is not tachypenic. No accessory muscle use.  Unlabored breathing Cardiovascular: RRR, no murmurs / rubs / gallops. S1 and S2 auscultated. No extremity edema.  Abdomen: Soft, non-tender, distended secondary to body habitus.  Bowel sounds positive.  GU: Deferred. Musculoskeletal: No clubbing / cyanosis of digits/nails. No joint deformity upper and lower extremities.  Skin: No rashes, lesions, ulcers on limited skin evaluation. No induration; Warm and dry.  Neurologic: CN 2-12 grossly intact with no focal deficits. Romberg sign and cerebellar reflexes not assessed.  Psychiatric: Normal judgment and insight. Alert  and oriented x 3. Normal mood and appropriate affect.   Data Reviewed: I have personally reviewed following labs and imaging studies  CBC: Recent Labs  Lab 09/03/22 1200 09/04/22 0406  WBC 25.6* 18.4*  NEUTROABS 22.5* 16.9*  HGB 14.0 12.3*  HCT 40.8 38.0*  MCV 86.1 90.3  PLT 291 0000000   Basic Metabolic Panel: Recent Labs  Lab 09/03/22 1200 09/04/22 0406  NA 135 134*  K 4.0 3.8  CL 99 106  CO2 19* 19*  GLUCOSE 121* 139*  BUN 23 14  CREATININE 1.27* 0.75  CALCIUM 9.1 8.2*   GFR: Estimated Creatinine Clearance: 101.3 mL/min (by C-G formula based on SCr of 0.75 mg/dL). Liver Function Tests: Recent Labs  Lab 09/03/22 1200 09/04/22 0406  AST 24 23  ALT 24 21  ALKPHOS 49 43  BILITOT 0.7 0.5  PROT 6.7 6.5  ALBUMIN 4.1 3.3*   No results for input(s): "LIPASE", "AMYLASE" in the last 168 hours. No results for input(s): "AMMONIA" in the last 168 hours. Coagulation Profile: No results for input(s): "INR", "PROTIME" in the last 168 hours. Cardiac Enzymes: No results for input(s): "CKTOTAL", "CKMB", "CKMBINDEX", "TROPONINI" in the last 168 hours. BNP (last 3 results) Recent Labs    12/31/21 1452  PROBNP 51.0   HbA1C: No results for input(s): "HGBA1C" in the last 72 hours. CBG: No results for input(s): "GLUCAP" in the last 168 hours. Lipid Profile: No results for input(s): "CHOL", "HDL", "LDLCALC", "TRIG", "CHOLHDL", "LDLDIRECT" in the last 72 hours. Thyroid Function Tests: No results for input(s): "TSH", "T4TOTAL", "FREET4", "T3FREE", "THYROIDAB" in the last 72 hours. Anemia Panel: No results for input(s): "VITAMINB12", "FOLATE", "FERRITIN", "TIBC", "IRON", "RETICCTPCT" in the last 72 hours. Sepsis Labs: Recent Labs  Lab 09/03/22 1341 09/03/22 1636  LATICACIDVEN 2.5* 2.1*    Recent Results (from the past 240 hour(s))  Resp panel by RT-PCR (RSV, Flu A&B, Covid) Anterior Nasal Swab     Status: None   Collection Time: 09/03/22 12:00 PM   Specimen: Anterior  Nasal Swab  Result Value Ref Range Status   SARS Coronavirus 2 by RT PCR NEGATIVE NEGATIVE Final    Comment: (NOTE) SARS-CoV-2 target nucleic acids are NOT DETECTED.  The SARS-CoV-2 RNA is generally detectable in upper respiratory specimens during the acute phase of infection. The lowest concentration of SARS-CoV-2 viral copies this assay can detect is 138 copies/mL. A negative result does not preclude SARS-Cov-2 infection and should not be used as the sole basis for treatment or other patient management decisions. A negative result may occur with  improper specimen collection/handling, submission of specimen other than nasopharyngeal swab, presence of viral mutation(s) within the areas targeted by this assay, and inadequate number of  viral copies(<138 copies/mL). A negative result must be combined with clinical observations, patient history, and epidemiological information. The expected result is Negative.  Fact Sheet for Patients:  EntrepreneurPulse.com.au  Fact Sheet for Healthcare Providers:  IncredibleEmployment.be  This test is no t yet approved or cleared by the [place] FDA and  has been authorized for detection and/or diagnosis of SARS-CoV-2 by FDA under an Emergency Use Authorization (EUA). This EUA will remain  in effect (meaning this test can be used) for the duration of the COVID-19 declaration under Section 564(b)(1) of the Act, 21 U.S.C.section 360bbb-3(b)(1), unless the authorization is terminated  or revoked sooner.       Influenza A by PCR NEGATIVE NEGATIVE Final   Influenza B by PCR NEGATIVE NEGATIVE Final    Comment: (NOTE) The Xpert Xpress SARS-CoV-2/FLU/RSV plus assay is intended as an aid in the diagnosis of influenza from Nasopharyngeal swab specimens and should not be used as a sole basis for treatment. Nasal washings and aspirates are unacceptable for Xpert Xpress SARS-CoV-2/FLU/RSV testing.  Fact Sheet for  Patients: EntrepreneurPulse.com.au  Fact Sheet for Healthcare Providers: IncredibleEmployment.be  This test is not yet approved or cleared by the [place] FDA and has been authorized for detection and/or diagnosis of SARS-CoV-2 by FDA under an Emergency Use Authorization (EUA). This EUA will remain in effect (meaning this test can be used) for the duration of the COVID-19 declaration under Section 564(b)(1) of the Act, 21 U.S.C. section 360bbb-3(b)(1), unless the authorization is terminated or revoked.     Resp Syncytial Virus by PCR NEGATIVE NEGATIVE Final    Comment: (NOTE) Fact Sheet for Patients: EntrepreneurPulse.com.au  Fact Sheet for Healthcare Providers: IncredibleEmployment.be  This test is not yet approved or cleared by the [place] FDA and has been authorized for detection and/or diagnosis of SARS-CoV-2 by FDA under an Emergency Use Authorization (EUA). This EUA will remain in effect (meaning this test can be used) for the duration of the COVID-19 declaration under Section 564(b)(1) of the Act, 21 U.S.C. section 360bbb-3(b)(1), unless the authorization is terminated or revoked.  Performed at [facility], [address], [place], [place] [address]   MRSA Next Gen by PCR, Nasal     Status: None   Collection Time: 09/03/22  3:14 PM   Specimen: Nasal Mucosa; Nasal Swab  Result Value Ref Range Status   MRSA by PCR Next Gen NOT DETECTED NOT DETECTED Final    Comment: (NOTE) The GeneXpert MRSA Assay (FDA approved for NASAL specimens only), is one component of a comprehensive MRSA colonization surveillance program. It is not intended to diagnose MRSA infection nor to guide or monitor treatment for MRSA infections. Test performance is not FDA approved in patients less than 13 years old. Performed at [facility], [place] [address] [place], [place]  [address]     Radiology Studies: DG Chest Portable 1 View  Result Date: 09/03/2022 CLINICAL DATA:  Shortness of breath EXAM: PORTABLE CHEST 1 VIEW COMPARISON:  04/15/2022 FINDINGS: The heart size and mediastinal contours are within normal limits. Patchy airspace opacities in the periphery of the right lower lobe. Left basilar scarring or atelectasis. No pneumothorax. The visualized skeletal structures are unremarkable. IMPRESSION: Patchy airspace opacities in the periphery of the right lower lobe, suspicious for pneumonia. Radiographic follow-up to resolution is recommended. Electronically Signed   By: Davina Poke D.O.   On: 09/03/2022 12:36    Scheduled Meds:  azelastine  1 spray Each Nare BID  carvedilol  3.125 mg Oral BID WC   enoxaparin (LOVENOX) injection  50 mg Subcutaneous Q24H   famotidine  20 mg Oral QHS   fesoterodine  4 mg Oral Daily   irbesartan  300 mg Oral Daily   polyethylene glycol  17 g Oral Daily   predniSONE  10 mg Oral Q breakfast   sodium chloride  1 g Oral BID WC   Continuous Infusions:  sodium chloride 100 mL/hr at 09/04/22 0159   azithromycin 500 mg (09/03/22 1457)   cefTRIAXone (ROCEPHIN)  IV 2 g (09/03/22 1449)    LOS: 1 day   Raiford Noble, DO [facility] Available via Epic secure chat 7am-7pm After these hours, please refer to coverage provider listed on [email or web address] 09/04/2022, 8:03 AM

## 2022-09-04 NOTE — Telephone Encounter (Signed)
Key: [id number]

## 2022-09-04 NOTE — Progress Notes (Signed)
  Transition of Care [facility]) Screening Note   Patient Details  Name: [name] Holder Date of Birth: 22-Aug-1955   Transition of Care [facility]) CM/SW Contact:    Lennart Pall, LCSW Phone Number: 09/04/2022, 10:40 AM    Transition of Care Department [facility]) has reviewed patient and no TOC needs have been identified at this time. We will continue to monitor patient advancement through interdisciplinary progression rounds. If new patient transition needs arise, please place a TOC consult.

## 2022-09-04 NOTE — Telephone Encounter (Signed)
Approved Effective from 09/04/2022 through 09/05/2023.

## 2022-09-05 ENCOUNTER — Inpatient Hospital Stay (HOSPITAL_COMMUNITY): Payer: Medicare Other

## 2022-09-05 DIAGNOSIS — J4489 Other specified chronic obstructive pulmonary disease: Secondary | ICD-10-CM | POA: Diagnosis not present

## 2022-09-05 DIAGNOSIS — I10 Essential (primary) hypertension: Secondary | ICD-10-CM | POA: Diagnosis not present

## 2022-09-05 DIAGNOSIS — J189 Pneumonia, unspecified organism: Secondary | ICD-10-CM | POA: Diagnosis not present

## 2022-09-05 DIAGNOSIS — A419 Sepsis, unspecified organism: Secondary | ICD-10-CM | POA: Diagnosis not present

## 2022-09-05 LAB — CBC WITH DIFFERENTIAL/PLATELET
Abs Immature Granulocytes: 0.06 10*3/uL (ref 0.00–0.07)
Basophils Absolute: 0 10*3/uL (ref 0.0–0.1)
Basophils Relative: 0 %
Eosinophils Absolute: 0.1 10*3/uL (ref 0.0–0.5)
Eosinophils Relative: 1 %
HCT: 33.3 % — ABNORMAL LOW (ref 39.0–52.0)
Hemoglobin: 10.6 g/dL — ABNORMAL LOW (ref 13.0–17.0)
Immature Granulocytes: 1 %
Lymphocytes Relative: 19 %
Lymphs Abs: 2.3 10*3/uL (ref 0.7–4.0)
MCH: 29 pg (ref 26.0–34.0)
MCHC: 31.8 g/dL (ref 30.0–36.0)
MCV: 91.2 fL (ref 80.0–100.0)
Monocytes Absolute: 0.6 10*3/uL (ref 0.1–1.0)
Monocytes Relative: 5 %
Neutro Abs: 8.9 10*3/uL — ABNORMAL HIGH (ref 1.7–7.7)
Neutrophils Relative %: 74 %
Platelets: 218 10*3/uL (ref 150–400)
RBC: 3.65 MIL/uL — ABNORMAL LOW (ref 4.22–5.81)
RDW: 13.8 % (ref 11.5–15.5)
WBC: 12 10*3/uL — ABNORMAL HIGH (ref 4.0–10.5)
nRBC: 0 % (ref 0.0–0.2)

## 2022-09-05 LAB — COMPREHENSIVE METABOLIC PANEL
ALT: 18 U/L (ref 0–44)
AST: 19 U/L (ref 15–41)
Albumin: 3 g/dL — ABNORMAL LOW (ref 3.5–5.0)
Alkaline Phosphatase: 41 U/L (ref 38–126)
Anion gap: 5 (ref 5–15)
BUN: 14 mg/dL (ref 8–23)
CO2: 18 mmol/L — ABNORMAL LOW (ref 22–32)
Calcium: 7.7 mg/dL — ABNORMAL LOW (ref 8.9–10.3)
Chloride: 112 mmol/L — ABNORMAL HIGH (ref 98–111)
Creatinine, Ser: 0.77 mg/dL (ref 0.61–1.24)
GFR, Estimated: >60 mL/min (ref >60)
Glucose, Bld: 91 mg/dL (ref 70–99)
Potassium: 3.5 mmol/L (ref 3.5–5.1)
Sodium: 135 mmol/L (ref 135–145)
Total Bilirubin: 0.5 mg/dL (ref 0.3–1.2)
Total Protein: 5.6 g/dL — ABNORMAL LOW (ref 6.5–8.1)

## 2022-09-05 LAB — MAGNESIUM: Magnesium: 2.3 mg/dL (ref 1.7–2.4)

## 2022-09-05 LAB — PHOSPHORUS: Phosphorus: 1.7 mg/dL — ABNORMAL LOW (ref 2.5–4.6)

## 2022-09-05 MED ORDER — FUROSEMIDE 10 MG/ML IJ SOLN
20.0000 mg | Freq: Once | INTRAMUSCULAR | Status: AC
  Administered 2022-09-05: 20 mg via INTRAVENOUS
  Filled 2022-09-05: qty 2

## 2022-09-05 MED ORDER — IPRATROPIUM-ALBUTEROL 0.5-2.5 (3) MG/3ML IN SOLN
3.0000 mL | Freq: Four times a day (QID) | RESPIRATORY_TRACT | Status: DC
  Administered 2022-09-05: 3 mL via RESPIRATORY_TRACT
  Filled 2022-09-05: qty 3

## 2022-09-05 MED ORDER — BUDESONIDE 0.25 MG/2ML IN SUSP
0.2500 mg | Freq: Two times a day (BID) | RESPIRATORY_TRACT | Status: DC
  Administered 2022-09-05 – 2022-09-06 (×3): 0.25 mg via RESPIRATORY_TRACT
  Filled 2022-09-05 (×3): qty 2

## 2022-09-05 MED ORDER — IPRATROPIUM-ALBUTEROL 0.5-2.5 (3) MG/3ML IN SOLN
3.0000 mL | Freq: Three times a day (TID) | RESPIRATORY_TRACT | Status: DC
  Administered 2022-09-05 – 2022-09-06 (×3): 3 mL via RESPIRATORY_TRACT
  Filled 2022-09-05 (×3): qty 3

## 2022-09-05 MED ORDER — ARFORMOTEROL TARTRATE 15 MCG/2ML IN NEBU
15.0000 ug | INHALATION_SOLUTION | Freq: Two times a day (BID) | RESPIRATORY_TRACT | Status: DC
  Administered 2022-09-05 – 2022-09-06 (×3): 15 ug via RESPIRATORY_TRACT
  Filled 2022-09-05 (×4): qty 2

## 2022-09-05 MED ORDER — POTASSIUM PHOSPHATES 15 MMOLE/5ML IV SOLN
30.0000 mmol | Freq: Once | INTRAVENOUS | Status: AC
  Administered 2022-09-05: 30 mmol via INTRAVENOUS
  Filled 2022-09-05: qty 10

## 2022-09-05 MED ORDER — FUROSEMIDE 20 MG PO TABS
20.0000 mg | ORAL_TABLET | Freq: Two times a day (BID) | ORAL | Status: DC
  Administered 2022-09-05 – 2022-09-06 (×2): 20 mg via ORAL
  Filled 2022-09-05 (×2): qty 1

## 2022-09-05 MED ORDER — SODIUM BICARBONATE 650 MG PO TABS
650.0000 mg | ORAL_TABLET | Freq: Two times a day (BID) | ORAL | Status: DC
  Administered 2022-09-05 – 2022-09-06 (×3): 650 mg via ORAL
  Filled 2022-09-05 (×3): qty 1

## 2022-09-05 NOTE — Progress Notes (Signed)
PT Cancellation Note  Patient Details Name: [name] Holder MRN: [id number] DOB: [birth year]   Cancelled Treatment:     PT order received but eval deferred this date.  Pt states feeling too poorly and with increased WOB.  Will follow.    Anikka Marsan 09/05/2022, 2:50 PM

## 2022-09-05 NOTE — Progress Notes (Signed)
OT Cancellation Note  Patient Details Name: RAYMERE FRAN MRN: [id number] DOB: 1955-09-29   Cancelled Treatment:    Reason Eval/Treat Not Completed: OT screened, no needs identified, will sign off. Patient is ambulating independently in room and perform Adls per RN. He is ambulating in hall with mobility tech. Patient has no apparent OT needs.  Jaque Dacy L Arelis Neumeier 09/05/2022, 2:17 PM

## 2022-09-05 NOTE — Progress Notes (Signed)
PROGRESS NOTE    [name] Holder  [id number] DOB: 1956/06/21 DOA: 09/03/2022 PCP: [name], MD   Brief Narrative:  The patient is a 67 year old Caucasian obese male with past medical history significant for but not limited to allergic rhinitis, prostate cancer and bladder cancer, chronic constipation, chronic lower back pain with a history of vertebral compression fracture T12 and T10, urolithiasis, history of pneumonia, history of colonic polyps, BPH, hypertension, mild persistent asthma, osteopenia as well as other comorbidities who presented to the ED with generalized weakness associated with the right lower lobe pleuritic chest pain, dyspnea cough as well as wheezing, fatigue, chills and fever since yesterday morning. Patient thinks he may have aspirated sputum earlier in the day and he denies any rhinorrhea discomfort or hemoptysis. No other complaints. Given his weakness and shortness of breath he presented to the ED. Patient also states that he had a fall yesterday and does not know how he "fell and did not know if he passed out". Upon arrival to the ED his CBC showed a white blood cell count 25,000 hemoglobin 14.0. Troponin and BNP were normal. Coronavirus, influenza AMB as well as RSV PCR was negative. Chest x-ray was done which showed patchy airspace opacities in the periphery of the right lower lobe suspicious for pneumonia. He is saturating well on room air and was admitted for evaluation and management of sepsis secondary to community-acquired pneumonia/right lower lobe pneumonia in setting of asthma and COPD.  He is slowly improving but continues to be dyspneic and so he will be given a dose of IV Lasix 20 mg and resumed on his home dose.  Will add Xopenex and Atrovent as well as Budesonide and Brovana and continue flutter valve and incentive spirometry.  X-ray done and showed unchanged appearance of the chest with the right lung opacities and pneumonia.  Assessment and  Plan:  Sepsis ([place]) due to CAP (community acquired pneumonia) superimposed on Asthma-COPD overlap syndrome -Admit to PCU/inpatient. -Met sepsis criteria on admission given that he was tachypneic, had a leukocytosis and also had a source of infection -Continue supplemental oxygen. -SpO2: 98 % O2 Flow Rate (L/min): 2 L/min (pt likes for periods of SOB) -IV hydration has now stopped -Follow-up lactic acid level now improved and will not -C/w Scheduled and as needed bronchodilators and have now added budesonide 0.25 mg nebs twice daily as well as arformoterol 15 mcg nebs twice daily -Continue Ceftriaxone 1 g IVPB daily and Continue Azithromycin 500 mg IVPB daily consider changing to IV Unasyn for aspiration -Continue guaifenesin 1200 mg p.o. twice daily, flutter valve and incentive spirometry -Single dose methylprednisolone 40 mg given. -C/w prednisone to 10 mg daily but may need to go back on IV Steroids if still dyspneic.  -Check strep pneumoniae urinary antigen. -Check sputum Gram stain, culture and sensitivity. -Resume home diuretics -Repeat CXR in the AM; Repeat CXR done and showed "Unchanged appearance of the chest with RIGHT lung opacities/pneumonia." -Follow-up blood culture and sensitivity. -WBC Trend: Recent Labs  Lab 08/11/22 1109 09/03/22 1200 09/04/22 0406 09/05/22 0503  WBC 10.4 25.6* 18.4* 12.0*  -Follow-up CBC and chemistry in the morning as WBC is improving   Essential Hypertension -Resume Furosemide for now. -Continue Olmesartan 40 mg p.o. daily substitution  -Continue to Monitor BP per Protocol -Last BP reading was elevated at 160/87   Prostate cancer [facility]) Secondary malignant neoplasm of bone and bone marrow (HCC) -Hide whole Zytiga until the patient recovers from pneumonia. -Follow-up with new oncologist  now that Dr. Alen Blew is no longer at the cancer center.   Lactic Acidosis -In the setting of infection -Patient's lactic acid level went from 2.5 -> 2.1 ->  3.4 -> 2.0 -IVF now stopped and will not trend LA anymore    Hyperlipidemia with target LDL less than 100 -Currently not on medical therapy. -Follow-up with primary.  Chronic diastolic CHF, currently not in exacerbation -EF showed grade 1 diastolic dysfunction with normal EF of 65 to 70% -EF was 79.2 -Does not appear volume overloaded on examination -Resume his home diuresis today but will give an extra dose of IV Lasix 20 mg x 1 -Strict I's and O's and daily weights 6-continue to monitor for signs and symptoms of volume overload and check chest x-ray in the morning   Fall with ? Etiology possibly Syncope -Patient states that prior to admission he had a fall and does not know how he fell.  Unclear etiology if this is related to syncope so we will get an echocardiogram -Obtain PT and OT to further evaluate and treat and check orthostatics; PT OT identified no needs and have signed off the case given that he is ambulating in the hall with the mobility tech -Check TSH and pending -Echocardiogram done and showed a left ventricular ejection fraction of 65 to 70% with no regional wall motion abnormalities as well as a mild left ventricular hypertrophy and left ventricular diastolic parameters consistent with grade 1 diastolic dysfunction   AKI -BUN/Cr Trend: Recent Labs  Lab 08/11/22 1109 09/03/22 1200 09/03/22 1200 09/04/22 0406 09/05/22 0503  BUN 16 23  --  14 14  CREATININE 0.84 1.27*   < > 0.75 0.77   < > = values in this interval not displayed.  -Improved with IVF Hydration with NS at 100 mL/hr but will stop IVF and resume home Furosemide after a dose of IV Lasix 20 mg x1 this AM  -Avoid Nephrotoxic Medications, Contrast Dyes, Hypotension and Dehydration to Ensure Adequate Renal Perfusion and will need to Renally Adjust Meds -Continue to Monitor and Trend Renal Function carefully and repeat CMP in the AM    Normocytic Anemia  -? Hemoconcentration on Admission and Dilutional  Drop -Hgb/Hct Trend: Recent Labs  Lab 08/11/22 1109 09/03/22 1200 09/04/22 0406 09/05/22 0503  HGB 12.6* 14.0 12.3* 10.6*  HCT 36.5* 40.8 38.0* 33.3*  MCV 87.3 86.1 90.3 91.2  -Check Anemia Panel in the AM -Continue to Monitor for S/Sx of Bleeding: No overt bleeding noted -Repeat CBC in the AM    Metabolic Acidosis -Mild. Patient's CO2 is now 18, anion gap is 5, chloride level is now 112 -Started -Continue to Monitor and Trend and Repeat CMP in the AM    Hypoalbuminemia -Patient's Albumin Trend: Recent Labs  Lab 08/11/22 1109 09/03/22 1200 09/04/22 0406 09/05/22 0503  ALBUMIN 3.9 4.1 3.3* 3.0*  -Continue to Monitor and Trend and repeat CMP in the AM   Class 2 Obesity -Complicates overall prognosis and care -Estimated body mass index is 35.71 kg/m as calculated from the following:   Height as of this encounter: 5' 7"$  (1.702 m).   Weight as of this encounter: 103.4 kg.  -Weight Loss and Dietary Counseling given  DVT prophylaxis: Enoxaparin 50 mg sq q24h    Code Status: Full Code Family Communication: Discussed with wife over the telephone   Disposition Plan:  Level of care: Med-Surg Status is: Inpatient Remains inpatient appropriate because: Continues to be dyspneic but is slowly improving and will  need an amatory home O2 screen prior to discharge as well as repeat chest x-ray in the a.m.: PT OT recommending a follow-up  Consultants:  None  Procedures:  ECHOCARDIOGRAM IMPRESSIONS     1. Left ventricular ejection fraction, by estimation, is 65 to 70%. The  left ventricle has normal function. The left ventricle has no regional  wall motion abnormalities. There is mild left ventricular hypertrophy.  Left ventricular diastolic parameters  are consistent with Grade I diastolic dysfunction (impaired relaxation).   2. Right ventricular systolic function is normal. The right ventricular  size is normal. Tricuspid regurgitation signal is inadequate for assessing   PA pressure.   3. The mitral valve is normal in structure. No evidence of mitral valve  regurgitation. No evidence of mitral stenosis.   4. The aortic valve was not well visualized. Aortic valve regurgitation  is not visualized. No aortic stenosis is present.   5. Aortic dilatation noted. There is mild dilatation of the ascending  aorta, measuring 38 mm.   6. The inferior vena cava is dilated in size with >50% respiratory  variability, suggesting right atrial pressure of 8 mmHg.   FINDINGS   Left Ventricle: Left ventricular ejection fraction, by estimation, is 65  to 70%. The left ventricle has normal function. The left ventricle has no  regional wall motion abnormalities. Definity contrast agent was given IV  to delineate the left ventricular   endocardial borders. The left ventricular internal cavity size was normal  in size. There is mild left ventricular hypertrophy. Left ventricular  diastolic parameters are consistent with Grade I diastolic dysfunction  (impaired relaxation).   Right Ventricle: The right ventricular size is normal. No increase in  right ventricular wall thickness. Right ventricular systolic function is  normal. Tricuspid regurgitation signal is inadequate for assessing PA  pressure.   Left Atrium: Left atrial size was normal in size.   Right Atrium: Right atrial size was normal in size.   Pericardium: There is no evidence of pericardial effusion. Presence of  epicardial fat layer.   Mitral Valve: The mitral valve is normal in structure. No evidence of  mitral valve regurgitation. No evidence of mitral valve stenosis.   Tricuspid Valve: The tricuspid valve is not well visualized. Tricuspid  valve regurgitation is not demonstrated.   Aortic Valve: The aortic valve was not well visualized. Aortic valve  regurgitation is not visualized. No aortic stenosis is present. Aortic  valve mean gradient measures 6.0 mmHg. Aortic valve peak gradient measures  9.1  mmHg. Aortic valve area, by VTI  measures 1.80 cm.   Pulmonic Valve: The pulmonic valve was not well visualized. Pulmonic valve  regurgitation is not visualized.   Aorta: The aortic root is normal in size and structure and aortic  dilatation noted. There is mild dilatation of the ascending aorta,  measuring 38 mm.   Venous: The inferior vena cava is dilated in size with greater than 50%  respiratory variability, suggesting right atrial pressure of 8 mmHg.   IAS/Shunts: The interatrial septum was not well visualized.     LEFT VENTRICLE  PLAX 2D  LVIDd:         3.90 cm   Diastology  LVIDs:         2.40 cm   LV e' medial:    5.25 cm/s  LV PW:         1.20 cm   LV E/e' medial:  11.8  LV IVS:  1.50 cm   LV e' lateral:   10.10 cm/s  LVOT diam:     1.90 cm   LV E/e' lateral: 6.1  LV SV:         51  LV SV Index:   24  LVOT Area:     2.84 cm     RIGHT VENTRICLE             IVC  RV Basal diam:  3.10 cm     IVC diam: 2.20 cm  RV S prime:     18.00 cm/s  TAPSE (M-mode): 2.9 cm   LEFT ATRIUM             Index        RIGHT ATRIUM           Index  LA diam:        3.90 cm 1.82 cm/m   RA Area:     13.40 cm  LA Vol (A2C):   52.7 ml 24.65 ml/m  RA Volume:   31.20 ml  14.59 ml/m  LA Vol (A4C):   33.6 ml 15.72 ml/m  LA Biplane Vol: 38.1 ml 17.82 ml/m   AORTIC VALVE                     PULMONIC VALVE  AV Area (Vmax):    1.67 cm      PV Vmax:       1.01 m/s  AV Area (Vmean):   1.67 cm      PV Peak grad:  4.1 mmHg  AV Area (VTI):     1.80 cm  AV Vmax:           151.00 cm/s  AV Vmean:          109.700 cm/s  AV VTI:            0.284 m  AV Peak Grad:      9.1 mmHg  AV Mean Grad:      6.0 mmHg  LVOT Vmax:         88.70 cm/s  LVOT Vmean:        64.800 cm/s  LVOT VTI:          0.180 m  LVOT/AV VTI ratio: 0.63    AORTA  Ao Root diam: 3.45 cm  Ao Asc diam:  3.80 cm  Ao Arch diam: 3.3 cm   MITRAL VALVE  MV Area (PHT): 4.57 cm    SHUNTS  MV Decel Time: 166 msec    Systemic  VTI:  0.18 m  MV E velocity: 61.90 cm/s  Systemic Diam: 1.90 cm  MV A velocity: 98.00 cm/s  MV E/A ratio:  0.63    Antimicrobials:  Anti-infectives (From admission, onward)    Start     Dose/Rate Route Frequency Ordered Stop   09/03/22 1345  cefTRIAXone (ROCEPHIN) 2 g in sodium chloride 0.9 % 100 mL IVPB        2 g 200 mL/hr over 30 Minutes Intravenous Every 24 hours 09/03/22 1341 09/08/22 1459   09/03/22 1345  azithromycin (ZITHROMAX) 500 mg in sodium chloride 0.9 % 250 mL IVPB        500 mg 250 mL/hr over 60 Minutes Intravenous Every 24 hours 09/03/22 1341 09/08/22 1459       Subjective: Patient examined at bedside and states that he is still feeling dyspneic and states he felt better with oxygen.  No nausea or vomiting.  States  that he is not really able to bring up his sputum.  No chest pain or shortness of breath.  Objective: Vitals:   09/04/22 1919 09/04/22 2018 09/05/22 0230 09/05/22 0614  BP: (!) 157/91   (!) 158/82  Pulse: 90   92  Resp: 18   18  Temp: 98.1 F (36.7 C)   97.7 F (36.5 C)  TempSrc: Oral   Oral  SpO2: 100% 98% 99% 98%  Weight:      Height:        Intake/Output Summary (Last 24 hours) at 09/05/2022 [id number] Last data filed at 09/05/2022 [id number] Gross per 24 hour  Intake 2629.95 ml  Output 1750 ml  Net 879.95 ml   Filed Weights   09/03/22 1142  Weight: 103.4 kg   Examination: Physical Exam:  Constitutional: WN/WD obese Caucasian male in NAD appears a little uncomfortable  Respiratory: Diminished to auscultation bilaterally with coarse breath sounds, no wheezing, rales, rhonchi or crackles. Normal respiratory effort and patient is not tachypenic. No accessory muscle use. Unlabored breathing  Cardiovascular: RRR, no murmurs / rubs / gallops. S1 and S2 auscultated. No extremity edema. Abdomen: Soft, non-tender, Distended 2/2 to body habitus. Bowel sounds positive.  GU: Deferred. Musculoskeletal: No clubbing / cyanosis of digits/nails. No joint  deformity upper and lower extremities. Skin: No rashes, lesions, ulcers on limited skin evaluation. No induration; Warm and dry.  Neurologic: CN 2-12 grossly intact with no focal deficits. Romberg sign and cerebellar reflexes not assessed.  Psychiatric: Normal judgment and insight. Alert and oriented x 3. Normal mood and appropriate affect.   Data Reviewed: I have personally reviewed following labs and imaging studies  CBC: Recent Labs  Lab 09/03/22 1200 09/04/22 0406 09/05/22 0503  WBC 25.6* 18.4* 12.0*  NEUTROABS 22.5* 16.9* 8.9*  HGB 14.0 12.3* 10.6*  HCT 40.8 38.0* 33.3*  MCV 86.1 90.3 91.2  PLT 291 246 [id number]   Basic Metabolic Panel: Recent Labs  Lab 09/03/22 1200 09/04/22 0406 09/05/22 0503  NA 135 134* 135  K 4.0 3.8 3.5  CL 99 106 112*  CO2 19* 19* 18*  GLUCOSE 121* 139* 91  BUN 23 14 14  $ CREATININE 1.27* 0.75 0.77  CALCIUM 9.1 8.2* 7.7*  MG  --   --  2.3  PHOS  --   --  1.7*   GFR: Estimated Creatinine Clearance: 101.3 mL/min (by C-G formula based on SCr of 0.77 mg/dL). Liver Function Tests: Recent Labs  Lab 09/03/22 1200 09/04/22 0406 09/05/22 0503  AST 24 23 19  $ ALT 24 21 18  $ ALKPHOS 49 43 41  BILITOT 0.7 0.5 0.5  PROT 6.7 6.5 5.6*  ALBUMIN 4.1 3.3* 3.0*   No results for input(s): "LIPASE", "AMYLASE" in the last 168 hours. No results for input(s): "AMMONIA" in the last 168 hours. Coagulation Profile: No results for input(s): "INR", "PROTIME" in the last 168 hours. Cardiac Enzymes: No results for input(s): "CKTOTAL", "CKMB", "CKMBINDEX", "TROPONINI" in the last 168 hours. BNP (last 3 results) Recent Labs    12/31/21 1452  PROBNP 51.0   HbA1C: No results for input(s): "HGBA1C" in the last 72 hours. CBG: No results for input(s): "GLUCAP" in the last 168 hours. Lipid Profile: No results for input(s): "CHOL", "HDL", "LDLCALC", "TRIG", "CHOLHDL", "LDLDIRECT" in the last 72 hours. Thyroid Function Tests: No results for input(s): "TSH",  "T4TOTAL", "FREET4", "T3FREE", "THYROIDAB" in the last 72 hours. Anemia Panel: No results for input(s): "VITAMINB12", "FOLATE", "FERRITIN", "TIBC", "IRON", "RETICCTPCT" in the last 72  hours. Sepsis Labs: Recent Labs  Lab 09/03/22 1341 09/03/22 1636 09/04/22 1014 09/04/22 1258  LATICACIDVEN 2.5* 2.1* 3.4* 2.0*    Recent Results (from the past 240 hour(s))  Resp panel by RT-PCR (RSV, Flu A&B, Covid) Anterior Nasal Swab     Status: None   Collection Time: 09/03/22 12:00 PM   Specimen: Anterior Nasal Swab  Result Value Ref Range Status   SARS Coronavirus 2 by RT PCR NEGATIVE NEGATIVE Final    Comment: (NOTE) SARS-CoV-2 target nucleic acids are NOT DETECTED.  The SARS-CoV-2 RNA is generally detectable in upper respiratory specimens during the acute phase of infection. The lowest concentration of SARS-CoV-2 viral copies this assay can detect is 138 copies/mL. A negative result does not preclude SARS-Cov-2 infection and should not be used as the sole basis for treatment or other patient management decisions. A negative result may occur with  improper specimen collection/handling, submission of specimen other than nasopharyngeal swab, presence of viral mutation(s) within the areas targeted by this assay, and inadequate number of viral copies(<138 copies/mL). A negative result must be combined with clinical observations, patient history, and epidemiological information. The expected result is Negative.  Fact Sheet for Patients:  EntrepreneurPulse.com.au  Fact Sheet for Healthcare Providers:  IncredibleEmployment.be  This test is no t yet approved or cleared by the [place] FDA and  has been authorized for detection and/or diagnosis of SARS-CoV-2 by FDA under an Emergency Use Authorization (EUA). This EUA will remain  in effect (meaning this test can be used) for the duration of the COVID-19 declaration under Section 564(b)(1) of the Act,  21 U.S.C.section 360bbb-3(b)(1), unless the authorization is terminated  or revoked sooner.       Influenza A by PCR NEGATIVE NEGATIVE Final   Influenza B by PCR NEGATIVE NEGATIVE Final    Comment: (NOTE) The Xpert Xpress SARS-CoV-2/FLU/RSV plus assay is intended as an aid in the diagnosis of influenza from Nasopharyngeal swab specimens and should not be used as a sole basis for treatment. Nasal washings and aspirates are unacceptable for Xpert Xpress SARS-CoV-2/FLU/RSV testing.  Fact Sheet for Patients: EntrepreneurPulse.com.au  Fact Sheet for Healthcare Providers: IncredibleEmployment.be  This test is not yet approved or cleared by the [place] FDA and has been authorized for detection and/or diagnosis of SARS-CoV-2 by FDA under an Emergency Use Authorization (EUA). This EUA will remain in effect (meaning this test can be used) for the duration of the COVID-19 declaration under Section 564(b)(1) of the Act, 21 U.S.C. section 360bbb-3(b)(1), unless the authorization is terminated or revoked.     Resp Syncytial Virus by PCR NEGATIVE NEGATIVE Final    Comment: (NOTE) Fact Sheet for Patients: EntrepreneurPulse.com.au  Fact Sheet for Healthcare Providers: IncredibleEmployment.be  This test is not yet approved or cleared by the [place] FDA and has been authorized for detection and/or diagnosis of SARS-CoV-2 by FDA under an Emergency Use Authorization (EUA). This EUA will remain in effect (meaning this test can be used) for the duration of the COVID-19 declaration under Section 564(b)(1) of the Act, 21 U.S.C. section 360bbb-3(b)(1), unless the authorization is terminated or revoked.  Performed at [facility], [address] [place], [place] [address]   Blood culture (routine x 2)     Status: None (Preliminary result)   Collection Time: 09/03/22  2:46 PM   Specimen:  BLOOD RIGHT FOREARM  Result Value Ref Range Status   Specimen Description   Final    BLOOD RIGHT FOREARM Performed at [facility], [address], [place], [place] [address]    Special Requests   Final    BOTTLES DRAWN AEROBIC AND ANAEROBIC Blood Culture adequate volume Performed at [facility], [address] [place], [place] [address]    Culture   Final    NO GROWTH 2 DAYS Performed at [facility], [place] [address] [place], [place] [address]    Report Status PENDING  Incomplete  Blood culture (routine x 2)     Status: None (Preliminary result)   Collection Time: 09/03/22  2:47 PM   Specimen: BLOOD  Result Value Ref Range Status   Specimen Description   Final    BLOOD LEFT ANTECUBITAL Performed at [facility], [place] [address] [place], [place] [address]    Special Requests   Final    BOTTLES DRAWN AEROBIC AND ANAEROBIC Blood Culture adequate volume Performed at [facility], [address], [place], [place] [address]    Culture   Final    NO GROWTH 2 DAYS Performed at [facility], [place] [address], [place],  [address]    Report Status PENDING  Incomplete  MRSA Next Gen by PCR, Nasal     Status: None   Collection Time: 09/03/22  3:14 PM   Specimen: Nasal Mucosa; Nasal Swab  Result Value Ref Range Status   MRSA by PCR Next Gen NOT DETECTED NOT DETECTED Final    Comment: (NOTE) The GeneXpert MRSA Assay (FDA approved for NASAL specimens only), is one component of a comprehensive MRSA colonization surveillance program. It is not intended to diagnose MRSA infection nor to guide or monitor treatment for MRSA infections. Test performance is not FDA approved in patients less than 67 years old. Performed at [facility], [place] [address] [place],  [address]     Radiology Studies: ECHOCARDIOGRAM COMPLETE  Result Date: 09/04/2022    ECHOCARDIOGRAM REPORT   Patient  Name:   [name] Holder Date of Exam: 09/04/2022 Medical Rec #:  [id number]         Height:       67.0 in Accession #:    [id number]        Weight:       228.0 lb Date of Birth:  02-10-1956         BSA:          2.138 m Patient Age:    11 years          BP:           130/70 mmHg Patient Gender: M                 HR:           98 bpm. Exam Location:  Inpatient Procedure: 2D Echo, Cardiac Doppler, Color Doppler and Intracardiac            Opacification Agent Indications:    R55 Syncope  History:        Patient has no prior history of Echocardiogram examinations.                 COPD, Signs/Symptoms:Syncope; Risk Factors:Former Smoker,                 Dyslipidemia and Hypertension.  Sonographer:    Wilkie Aye RVT RCS Referring Phys: [phone] [facility] LATIF [facility]  Sonographer Comments: Technically difficult study due to poor echo windows, suboptimal parasternal window, suboptimal apical window and suboptimal subcostal  window. IMPRESSIONS  1. Left ventricular ejection fraction, by estimation, is 65 to 70%. The left ventricle has normal function. The left ventricle has no regional wall motion abnormalities. There is mild left ventricular hypertrophy. Left ventricular diastolic parameters are consistent with Grade I diastolic dysfunction (impaired relaxation).  2. Right ventricular systolic function is normal. The right ventricular size is normal. Tricuspid regurgitation signal is inadequate for assessing PA pressure.  3. The mitral valve is normal in structure. No evidence of mitral valve regurgitation. No evidence of mitral stenosis.  4. The aortic valve was not well visualized. Aortic valve regurgitation is not visualized. No aortic stenosis is present.  5. Aortic dilatation noted. There is mild dilatation of the ascending aorta, measuring 38 mm.  6. The inferior vena cava is dilated in size with >50% respiratory variability, suggesting right atrial pressure of 8 mmHg. FINDINGS  Left Ventricle: Left ventricular ejection  fraction, by estimation, is 65 to 70%. The left ventricle has normal function. The left ventricle has no regional wall motion abnormalities. Definity contrast agent was given IV to delineate the left ventricular  endocardial borders. The left ventricular internal cavity size was normal in size. There is mild left ventricular hypertrophy. Left ventricular diastolic parameters are consistent with Grade I diastolic dysfunction (impaired relaxation). Right Ventricle: The right ventricular size is normal. No increase in right ventricular wall thickness. Right ventricular systolic function is normal. Tricuspid regurgitation signal is inadequate for assessing PA pressure. Left Atrium: Left atrial size was normal in size. Right Atrium: Right atrial size was normal in size. Pericardium: There is no evidence of pericardial effusion. Presence of epicardial fat layer. Mitral Valve: The mitral valve is normal in structure. No evidence of mitral valve regurgitation. No evidence of mitral valve stenosis. Tricuspid Valve: The tricuspid valve is not well visualized. Tricuspid valve regurgitation is not demonstrated. Aortic Valve: The aortic valve was not well visualized. Aortic valve regurgitation is not visualized. No aortic stenosis is present. Aortic valve mean gradient measures 6.0 mmHg. Aortic valve peak gradient measures 9.1 mmHg. Aortic valve area, by VTI measures 1.80 cm. Pulmonic Valve: The pulmonic valve was not well visualized. Pulmonic valve regurgitation is not visualized. Aorta: The aortic root is normal in size and structure and aortic dilatation noted. There is mild dilatation of the ascending aorta, measuring 38 mm. Venous: The inferior vena cava is dilated in size with greater than 50% respiratory variability, suggesting right atrial pressure of 8 mmHg. IAS/Shunts: The interatrial septum was not well visualized.  LEFT VENTRICLE PLAX 2D LVIDd:         3.90 cm   Diastology LVIDs:         2.40 cm   LV e' medial:     5.25 cm/s LV PW:         1.20 cm   LV E/e' medial:  11.8 LV IVS:        1.50 cm   LV e' lateral:   10.10 cm/s LVOT diam:     1.90 cm   LV E/e' lateral: 6.1 LV SV:         51 LV SV Index:   24 LVOT Area:     2.84 cm  RIGHT VENTRICLE             IVC RV Basal diam:  3.10 cm     IVC diam: 2.20 cm RV S prime:     18.00 cm/s TAPSE (M-mode): 2.9 cm LEFT ATRIUM  Index        RIGHT ATRIUM           Index LA diam:        3.90 cm 1.82 cm/m   RA Area:     13.40 cm LA Vol (A2C):   52.7 ml 24.65 ml/m  RA Volume:   31.20 ml  14.59 ml/m LA Vol (A4C):   33.6 ml 15.72 ml/m LA Biplane Vol: 38.1 ml 17.82 ml/m  AORTIC VALVE                     PULMONIC VALVE AV Area (Vmax):    1.67 cm      PV Vmax:       1.01 m/s AV Area (Vmean):   1.67 cm      PV Peak grad:  4.1 mmHg AV Area (VTI):     1.80 cm AV Vmax:           151.00 cm/s AV Vmean:          109.700 cm/s AV VTI:            0.284 m AV Peak Grad:      9.1 mmHg AV Mean Grad:      6.0 mmHg LVOT Vmax:         88.70 cm/s LVOT Vmean:        64.800 cm/s LVOT VTI:          0.180 m LVOT/AV VTI ratio: 0.63  AORTA Ao Root diam: 3.45 cm Ao Asc diam:  3.80 cm Ao Arch diam: 3.3 cm MITRAL VALVE MV Area (PHT): 4.57 cm    SHUNTS MV Decel Time: 166 msec    Systemic VTI:  0.18 m MV E velocity: 61.90 cm/s  Systemic Diam: 1.90 cm MV A velocity: 98.00 cm/s MV E/A ratio:  0.63 Oswaldo Milian MD Electronically signed by Oswaldo Milian MD Signature Date/Time: 09/04/2022/4:53:02 PM    Final    DG CHEST PORT 1 VIEW  Result Date: 09/04/2022 CLINICAL DATA:  Shortness of breath EXAM: PORTABLE CHEST 1 VIEW COMPARISON:  09/03/2022 and prior studies FINDINGS: The cardiomediastinal silhouette is unchanged. Patchy opacities within the mid and LOWER RIGHT lung are again identified as well as subsegmental atelectasis/scarring within the LEFT LOWER lung. There has been little interval change since the prior study. There is no evidence of pneumothorax or large pleural effusion. IMPRESSION:  Little significant change in patchy opacities within the mid and LOWER RIGHT lung and subsegmental atelectasis/scarring within the LEFT LOWER lung. Electronically Signed   By: Margarette Canada M.D.   On: 09/04/2022 10:33   DG Chest Portable 1 View  Result Date: 09/03/2022 CLINICAL DATA:  Shortness of breath EXAM: PORTABLE CHEST 1 VIEW COMPARISON:  04/15/2022 FINDINGS: The heart size and mediastinal contours are within normal limits. Patchy airspace opacities in the periphery of the right lower lobe. Left basilar scarring or atelectasis. No pneumothorax. The visualized skeletal structures are unremarkable. IMPRESSION: Patchy airspace opacities in the periphery of the right lower lobe, suspicious for pneumonia. Radiographic follow-up to resolution is recommended. Electronically Signed   By: Davina Poke D.O.   On: 09/03/2022 12:36    Scheduled Meds:  azelastine  1 spray Each Nare BID   carvedilol  3.125 mg Oral BID WC   enoxaparin (LOVENOX) injection  50 mg Subcutaneous Q24H   famotidine  20 mg Oral QHS   fesoterodine  4 mg Oral Daily   guaiFENesin  1,200 mg Oral BID  ipratropium  0.5 mg Nebulization Q6H   irbesartan  300 mg Oral Daily   levalbuterol  0.63 mg Nebulization Q6H   polyethylene glycol  17 g Oral Daily   predniSONE  10 mg Oral Q breakfast   sodium chloride  1 g Oral BID WC   Continuous Infusions:  azithromycin 500 mg (09/04/22 1535)   cefTRIAXone (ROCEPHIN)  IV 2 g (09/04/22 1405)   potassium PHOSPHATE IVPB (in mmol)      LOS: 2 days   Raiford Noble, DO [facility] Available via Epic secure chat 7am-7pm After these hours, please refer to coverage provider listed on [email or web address] 09/05/2022, 8:32 AM

## 2022-09-06 ENCOUNTER — Inpatient Hospital Stay (HOSPITAL_COMMUNITY): Payer: Medicare Other

## 2022-09-06 ENCOUNTER — Telehealth: Payer: Self-pay | Admitting: Pulmonary Disease

## 2022-09-06 DIAGNOSIS — A419 Sepsis, unspecified organism: Secondary | ICD-10-CM | POA: Diagnosis not present

## 2022-09-06 DIAGNOSIS — J189 Pneumonia, unspecified organism: Secondary | ICD-10-CM | POA: Diagnosis not present

## 2022-09-06 DIAGNOSIS — J9601 Acute respiratory failure with hypoxia: Secondary | ICD-10-CM

## 2022-09-06 DIAGNOSIS — J4489 Other specified chronic obstructive pulmonary disease: Secondary | ICD-10-CM | POA: Diagnosis not present

## 2022-09-06 DIAGNOSIS — E669 Obesity, unspecified: Secondary | ICD-10-CM | POA: Diagnosis not present

## 2022-09-06 LAB — IRON AND TIBC
Iron: 58 ug/dL (ref 45–182)
Saturation Ratios: 23 % (ref 17.9–39.5)
TIBC: 252 ug/dL (ref 250–450)
UIBC: 194 ug/dL

## 2022-09-06 LAB — CBC WITH DIFFERENTIAL/PLATELET
Abs Immature Granulocytes: 0.05 10*3/uL (ref 0.00–0.07)
Basophils Absolute: 0 10*3/uL (ref 0.0–0.1)
Basophils Relative: 0 %
Eosinophils Absolute: 0.1 10*3/uL (ref 0.0–0.5)
Eosinophils Relative: 2 %
HCT: 33.5 % — ABNORMAL LOW (ref 39.0–52.0)
Hemoglobin: 11 g/dL — ABNORMAL LOW (ref 13.0–17.0)
Immature Granulocytes: 1 %
Lymphocytes Relative: 19 %
Lymphs Abs: 1.8 10*3/uL (ref 0.7–4.0)
MCH: 28.9 pg (ref 26.0–34.0)
MCHC: 32.8 g/dL (ref 30.0–36.0)
MCV: 88.2 fL (ref 80.0–100.0)
Monocytes Absolute: 0.7 10*3/uL (ref 0.1–1.0)
Monocytes Relative: 7 %
Neutro Abs: 6.9 10*3/uL (ref 1.7–7.7)
Neutrophils Relative %: 71 %
Platelets: 218 10*3/uL (ref 150–400)
RBC: 3.8 MIL/uL — ABNORMAL LOW (ref 4.22–5.81)
RDW: 13.6 % (ref 11.5–15.5)
WBC: 9.6 10*3/uL (ref 4.0–10.5)
nRBC: 0 % (ref 0.0–0.2)

## 2022-09-06 LAB — COMPREHENSIVE METABOLIC PANEL
ALT: 19 U/L (ref 0–44)
AST: 19 U/L (ref 15–41)
Albumin: 3.5 g/dL (ref 3.5–5.0)
Alkaline Phosphatase: 41 U/L (ref 38–126)
Anion gap: 11 (ref 5–15)
BUN: 12 mg/dL (ref 8–23)
CO2: 21 mmol/L — ABNORMAL LOW (ref 22–32)
Calcium: 8.1 mg/dL — ABNORMAL LOW (ref 8.9–10.3)
Chloride: 103 mmol/L (ref 98–111)
Creatinine, Ser: 0.76 mg/dL (ref 0.61–1.24)
GFR, Estimated: >60 mL/min (ref >60)
Glucose, Bld: 87 mg/dL (ref 70–99)
Potassium: 3.4 mmol/L — ABNORMAL LOW (ref 3.5–5.1)
Sodium: 135 mmol/L (ref 135–145)
Total Bilirubin: 0.7 mg/dL (ref 0.3–1.2)
Total Protein: 5.9 g/dL — ABNORMAL LOW (ref 6.5–8.1)

## 2022-09-06 LAB — MAGNESIUM: Magnesium: 2.2 mg/dL (ref 1.7–2.4)

## 2022-09-06 LAB — RETICULOCYTES
Immature Retic Fract: 12.8 % (ref 2.3–15.9)
RBC.: 3.83 MIL/uL — ABNORMAL LOW (ref 4.22–5.81)
Retic Count, Absolute: 68.9 10*3/uL (ref 19.0–186.0)
Retic Ct Pct: 1.8 % (ref 0.4–3.1)

## 2022-09-06 LAB — TSH: TSH: 1.025 u[IU]/mL (ref 0.350–4.500)

## 2022-09-06 LAB — FOLATE: Folate: 23.3 ng/mL (ref >5.9)

## 2022-09-06 LAB — FERRITIN: Ferritin: 125 ng/mL (ref 24–336)

## 2022-09-06 LAB — PHOSPHORUS: Phosphorus: 2.6 mg/dL (ref 2.5–4.6)

## 2022-09-06 LAB — VITAMIN B12: Vitamin B-12: 470 pg/mL (ref 180–914)

## 2022-09-06 MED ORDER — GUAIFENESIN ER 600 MG PO TB12: 600.0000 mg | ORAL_TABLET | Freq: Two times a day (BID) | ORAL | 0 refills | Status: AC

## 2022-09-06 MED ORDER — POTASSIUM CHLORIDE CRYS ER 20 MEQ PO TBCR
40.0000 meq | EXTENDED_RELEASE_TABLET | Freq: Two times a day (BID) | ORAL | Status: DC
  Administered 2022-09-06: 40 meq via ORAL
  Filled 2022-09-06: qty 2

## 2022-09-06 MED ORDER — ONDANSETRON HCL 4 MG PO TABS: 4.0000 mg | ORAL_TABLET | Freq: Four times a day (QID) | ORAL | 0 refills | Status: DC | PRN

## 2022-09-06 MED ORDER — PREDNISONE 5 MG PO TABS: 10.0000 mg | ORAL_TABLET | Freq: Two times a day (BID) | ORAL | Status: DC

## 2022-09-06 MED ORDER — AMOXICILLIN-POT CLAVULANATE 875-125 MG PO TABS: 1.0000 | ORAL_TABLET | Freq: Two times a day (BID) | ORAL | 0 refills | Status: DC

## 2022-09-06 MED ORDER — SODIUM BICARBONATE 650 MG PO TABS: 650.0000 mg | ORAL_TABLET | Freq: Two times a day (BID) | ORAL | 0 refills | Status: AC

## 2022-09-06 MED ORDER — ABIRATERONE ACETATE 250 MG PO TABS: 1000.0000 mg | ORAL_TABLET | Freq: Every day | ORAL | Status: DC

## 2022-09-06 NOTE — Discharge Summary (Incomplete)
Physician Discharge Summary   Patient: [name] Holder MRN: [id number] DOB: Dec 16, 1955  Admit date:     09/03/2022  Discharge date: 09/06/22  Discharge Physician: Raiford Noble, DO   PCP: [name], MD   Recommendations at discharge:    ***  Discharge Diagnoses: Principal Problem:   CAP (community acquired pneumonia) Active Problems:   Essential hypertension   Hyperlipidemia with target LDL less than 100   Secondary malignant neoplasm of bone and bone marrow ([place])   Sepsis ([place])   Asthma-COPD overlap syndrome   Prostate cancer ([place])   Class 2 obesity  Resolved Problems:   * No resolved hospital problems. [facility] Course: The patient is a 67 year old Caucasian obese male with past medical history significant for but not limited to allergic rhinitis, prostate cancer and bladder cancer, chronic constipation, chronic lower back pain with a history of vertebral compression fracture T12 and T10, urolithiasis, history of pneumonia, history of colonic polyps, BPH, hypertension, mild persistent asthma, osteopenia as well as other comorbidities who presented to the ED with generalized weakness associated with the right lower lobe pleuritic chest pain, dyspnea cough as well as wheezing, fatigue, chills and fever since yesterday morning. Patient thinks he may have aspirated sputum earlier in the day and he denies any rhinorrhea discomfort or hemoptysis. No other complaints. Given his weakness and shortness of breath he presented to the ED. Patient also states that he had a fall yesterday and does not know how he "fell and did not know if he passed out". Upon arrival to the ED his CBC showed a white blood cell count 25,000 hemoglobin 14.0. Troponin and BNP were normal. Coronavirus, influenza AMB as well as RSV PCR was negative. Chest x-ray was done which showed patchy airspace opacities in the periphery of the right lower lobe suspicious for pneumonia. He is saturating well on room air and  was admitted for evaluation and management of sepsis secondary to community-acquired pneumonia/right lower lobe pneumonia in setting of asthma and COPD.   He is slowly improving but continues to be dyspneic and so he will be given a dose of IV Lasix 20 mg and resumed on his home dose.  Will add Xopenex and Atrovent as well as Budesonide and Brovana and continue flutter valve and incentive spirometry.  X-ray done and showed unchanged appearance of the chest with the right lung opacities and pneumonia.  Assessment and Plan:  Sepsis ([place]) due to CAP (community acquired pneumonia) superimposed on Asthma-COPD overlap syndrome -Admit to PCU/inpatient. -Met sepsis criteria on admission given that he was tachypneic, had a leukocytosis and also had a source of infection -Continue supplemental oxygen. -SpO2: 98 % O2 Flow Rate (L/min): 2 L/min (pt likes for periods of SOB) -IV hydration has now stopped -Follow-up lactic acid level now improved and will not -C/w Scheduled and as needed bronchodilators and have now added budesonide 0.25 mg nebs twice daily as well as arformoterol 15 mcg nebs twice daily -Continue Ceftriaxone 1 g IVPB daily and Continue Azithromycin 500 mg IVPB daily consider changing to IV Unasyn for aspiration -Continue guaifenesin 1200 mg p.o. twice daily, flutter valve and incentive spirometry -Single dose methylprednisolone 40 mg given. -C/w prednisone to 10 mg daily but may need to go back on IV Steroids if still dyspneic.  -Check strep pneumoniae urinary antigen. -Check sputum Gram stain, culture and sensitivity. -Resume home diuretics -Repeat CXR in the AM; Repeat CXR done and showed "Unchanged appearance of the chest with RIGHT lung  opacities/pneumonia." -Follow-up blood culture and sensitivity. -WBC Trend: Last Labs        Recent Labs  Lab 08/11/22 1109 09/03/22 1200 09/04/22 0406 09/05/22 0503  WBC 10.4 25.6* 18.4* 12.0*    -Follow-up CBC and chemistry in the morning  as WBC is improving   Essential Hypertension -Resume Furosemide for now. -Continue Olmesartan 40 mg p.o. daily substitution  -Continue to Monitor BP per Protocol -Last BP reading was elevated at 160/87   Prostate cancer [facility]) Secondary malignant neoplasm of bone and bone marrow (HCC) -Hide whole Zytiga until the patient recovers from pneumonia. -Follow-up with new oncologist now that Dr. Alen Blew is no longer at the cancer center.   Lactic Acidosis -In the setting of infection -Patient's lactic acid level went from 2.5 -> 2.1 -> 3.4 -> 2.0 -IVF now stopped and will not trend LA anymore    Hyperlipidemia with target LDL less than 100 -Currently not on medical therapy. -Follow-up with primary.   Chronic diastolic CHF, currently not in exacerbation -EF showed grade 1 diastolic dysfunction with normal EF of 65 to 70% -EF was 79.2 -Does not appear volume overloaded on examination -Resume his home diuresis today but will give an extra dose of IV Lasix 20 mg x 1 -Strict I's and O's and daily weights 6-continue to monitor for signs and symptoms of volume overload and check chest x-ray in the morning   Fall with ? Etiology possibly Syncope -Patient states that prior to admission he had a fall and does not know how he fell.  Unclear etiology if this is related to syncope so we will get an echocardiogram -Obtain PT and OT to further evaluate and treat and check orthostatics; PT OT identified no needs and have signed off the case given that he is ambulating in the hall with the mobility tech -Check TSH and pending -Echocardiogram done and showed a left ventricular ejection fraction of 65 to 70% with no regional wall motion abnormalities as well as a mild left ventricular hypertrophy and left ventricular diastolic parameters consistent with grade 1 diastolic dysfunction   AKI -BUN/Cr Trend: Last Labs         Recent Labs  Lab 08/11/22 1109 09/03/22 1200 09/03/22 1200 09/04/22 0406  09/05/22 0503  BUN 16 23  --  14 14  CREATININE 0.84 1.27*   < > 0.75 0.77   < > = values in this interval not displayed.    -Improved with IVF Hydration with NS at 100 mL/hr but will stop IVF and resume home Furosemide after a dose of IV Lasix 20 mg x1 this AM  -Avoid Nephrotoxic Medications, Contrast Dyes, Hypotension and Dehydration to Ensure Adequate Renal Perfusion and will need to Renally Adjust Meds -Continue to Monitor and Trend Renal Function carefully and repeat CMP in the AM    Normocytic Anemia  -? Hemoconcentration on Admission and Dilutional Drop -Hgb/Hct Trend: Last Labs        Recent Labs  Lab 08/11/22 1109 09/03/22 1200 09/04/22 0406 09/05/22 0503  HGB 12.6* 14.0 12.3* 10.6*  HCT 36.5* 40.8 38.0* 33.3*  MCV 87.3 86.1 90.3 91.2    -Check Anemia Panel in the AM -Continue to Monitor for S/Sx of Bleeding: No overt bleeding noted -Repeat CBC in the AM    Metabolic Acidosis -Mild. Patient's CO2 is now 18, anion gap is 5, chloride level is now 112 -Started -Continue to Monitor and Trend and Repeat CMP in the AM    Hypoalbuminemia -Patient's Albumin  Trend: Last Labs        Recent Labs  Lab 08/11/22 1109 09/03/22 1200 09/04/22 0406 09/05/22 0503  ALBUMIN 3.9 4.1 3.3* 3.0*    -Continue to Monitor and Trend and repeat CMP in the AM   Class 2 Obesity Obesity -Complicates overall prognosis and care -Estimated body mass index is 35.71 kg/m as calculated from the following:   Height as of this encounter: 5' 7"$  (1.702 m).   Weight as of this encounter: 103.4 kg.  -Weight Loss and Dietary Counseling given  {(NOTE) Pain control PDMP Statment (Optional):26782} Consultants: *** Procedures performed: ***  Disposition: {Plan; Disposition:26390} Diet recommendation:  Discharge Diet Orders (From admission, onward)     Start     Ordered   09/06/22 0000  Diet - low sodium heart healthy        09/06/22 1438           {Diet_Plan:26776} DISCHARGE  MEDICATION: Allergies as of 09/06/2022       Reactions   Amlodipine Swelling   edema   Tetracycline Rash        Medication List     TAKE these medications    abiraterone acetate 250 MG tablet Commonly known as: ZYTIGA Take 4 tablets (1,000 mg total) by mouth daily.   acetaminophen 325 MG tablet Commonly known as: TYLENOL Take 325 mg by mouth daily as needed for fever.   albuterol (2.5 MG/3ML) 0.083% nebulizer solution Commonly known as: PROVENTIL Take 3 mLs (2.5 mg total) by nebulization every 6 (six) hours as needed for wheezing or shortness of breath.   albuterol 108 (90 Base) MCG/ACT inhaler Commonly known as: VENTOLIN HFA Inhale 2 puffs into the lungs every 6 (six) hours as needed for wheezing or shortness of breath.   amoxicillin-clavulanate 875-125 MG tablet Commonly known as: AUGMENTIN Take 1 tablet by mouth 2 (two) times daily for 3 days.   ascorbic acid 500 MG tablet Commonly known as: VITAMIN C Take 500 mg by mouth every evening.   azelastine 0.1 % nasal spray Commonly known as: ASTELIN Place 2 puffs into the nose in the morning and at bedtime.   CALCIUM-VITAMIN D PO Take 1 tablet by mouth 2 (two) times daily.   carvedilol 3.125 MG tablet Commonly known as: COREG TAKE 1 TABLET 2 TIMES A DAY WITH BREAKFAST & SUPPER   CENTRUM SILVER 50+MEN PO Take 1 tablet by mouth daily.   denosumab 60 MG/ML Sosy injection Commonly known as: PROLIA Inject 60 mg into the skin every 6 (six) months.   diazepam 2 MG tablet Commonly known as: Valium Take 1 tablet (2 mg total) by mouth every 8 (eight) hours as needed for anxiety.   fluticasone 50 MCG/ACT nasal spray Commonly known as: FLONASE Place 2 sprays into both nostrils daily.   furosemide 20 MG tablet Commonly known as: LASIX TAKE ONE TABLET TWICE DAILY   guaiFENesin 600 MG 12 hr tablet Commonly known as: MUCINEX Take 1 tablet (600 mg total) by mouth 2 (two) times daily for 5 days.   leuprolide 11.25  MG injection Commonly known as: LUPRON Inject 11.25 mg into the muscle every 3 (three) months.   lubiprostone 24 MCG capsule Commonly known as: AMITIZA Take 1 capsule (24 mcg total) by mouth 2 (two) times daily with a meal.   meclizine 25 MG tablet Commonly known as: ANTIVERT Take 1 tablet (25 mg total) by mouth 3 (three) times daily as needed for dizziness.   Narcan 4 MG/0.1ML Liqd nasal  spray kit Generic drug: naloxone Place 0.4 mg into the nose once as needed (overdose).   olmesartan 40 MG tablet Commonly known as: BENICAR TAKE ONE TABLET ONCE DAILY   ondansetron 4 MG tablet Commonly known as: ZOFRAN Take 1 tablet (4 mg total) by mouth every 6 (six) hours as needed for nausea.   oxyCODONE 5 MG immediate release tablet Commonly known as: Oxy IR/ROXICODONE Take 1 tablet (5 mg total) by mouth every 4 (four) hours as needed for severe pain.   PEPCID AC PO Take 1 tablet by mouth at bedtime.   polyethylene glycol powder 17 GM/SCOOP powder Commonly known as: GLYCOLAX/MIRALAX Take 17 g (1 capful) by mouth daily as directed.   predniSONE 5 MG tablet Commonly known as: DELTASONE TAKE 1 TABLET 2 TIMES A DAY   sodium bicarbonate 650 MG tablet Take 1 tablet (650 mg total) by mouth 2 (two) times daily for 3 days.   sodium chloride 1 g tablet Take 1 g by mouth 2 (two) times daily with a meal.   solifenacin 5 MG tablet Commonly known as: VESICARE Take 5 mg by mouth daily.   Trelegy Ellipta 100-62.5-25 MCG/ACT Aepb Generic drug: Fluticasone-Umeclidin-Vilant Inhale 1 puff into the lungs daily.   Vista Advanced Carotenoid Caps Take 1 capsule by mouth every evening.   vitamin E 180 MG (400 UNITS) capsule Take 400 Units by mouth every evening.               Durable Medical Equipment  (From admission, onward)           Start     Ordered   09/06/22 1441  For home use only DME oxygen  Once       Question Answer Comment  Length of Need 6 Months   Mode or  (Route) Nasal cannula   Liters per Minute 2   Frequency Continuous (stationary and portable oxygen unit needed)   Oxygen delivery system Gas      09/06/22 1440            Follow-up Information     [facility] [place] Follow up.   Contact information: [address] [place] [id number] [phone]                Discharge Exam: Filed Weights   09/03/22 1142  Weight: 103.4 kg   ***  Condition at discharge: {DC Condition:26389}  The results of significant diagnostics from this hospitalization (including imaging, microbiology, ancillary and laboratory) are listed below for reference.   Imaging Studies: DG CHEST PORT 1 VIEW  Result Date: 09/06/2022 CLINICAL DATA:  Short of breath EXAM: PORTABLE CHEST 1 VIEW COMPARISON:  Prior chest x-ray yesterday at 5:46 a.m. FINDINGS: Stable cardiac and mediastinal contours. Slightly increased linear airspace opacities in the lingula most consistent with areas of atelectasis. Similar appearance of patchy airspace opacities in the right lung base consistent with pneumonia. No pneumothorax. No new pleural effusion. Chronic bronchitic changes are stable. No acute osseous abnormality. IMPRESSION: Slightly increased lingular atelectasis. Stable right basilar airspace opacities likely reflecting pneumonia. Electronically Signed   By: Jacqulynn Cadet M.D.   On: 09/06/2022 08:25   DG CHEST PORT 1 VIEW  Result Date: 09/05/2022 CLINICAL DATA:  Shortness of breath EXAM: PORTABLE CHEST 1 VIEW COMPARISON:  09/04/2022 prior studies FINDINGS: Cardiomediastinal silhouette is unchanged. Peripheral and basilar opacities within the RIGHT lung are not significantly changed. Subsegmental atelectasis/scarring within the LOWER LEFT lung again noted. No pneumothorax or large pleural effusion  identified. IMPRESSION: Unchanged appearance of the chest with RIGHT lung opacities/pneumonia. Electronically Signed   By: Margarette Canada M.D.   On: 09/05/2022 08:58    ECHOCARDIOGRAM COMPLETE  Result Date: 09/04/2022    ECHOCARDIOGRAM REPORT   Patient Name:   IRVING POTEAT Date of Exam: 09/04/2022 Medical Rec #:  [id number]         Height:       67.0 in Accession #:    [id number]        Weight:       228.0 lb Date of Birth:  07-28-1955         BSA:          2.138 m Patient Age:    38 years          BP:           130/70 mmHg Patient Gender: M                 HR:           98 bpm. Exam Location:  Inpatient Procedure: 2D Echo, Cardiac Doppler, Color Doppler and Intracardiac            Opacification Agent Indications:    R55 Syncope  History:        Patient has no prior history of Echocardiogram examinations.                 COPD, Signs/Symptoms:Syncope; Risk Factors:Former Smoker,                 Dyslipidemia and Hypertension.  Sonographer:    Wilkie Aye RVT RCS Referring Phys: [phone] [facility] LATIF [facility]  Sonographer Comments: Technically difficult study due to poor echo windows, suboptimal parasternal window, suboptimal apical window and suboptimal subcostal window. IMPRESSIONS  1. Left ventricular ejection fraction, by estimation, is 65 to 70%. The left ventricle has normal function. The left ventricle has no regional wall motion abnormalities. There is mild left ventricular hypertrophy. Left ventricular diastolic parameters are consistent with Grade I diastolic dysfunction (impaired relaxation).  2. Right ventricular systolic function is normal. The right ventricular size is normal. Tricuspid regurgitation signal is inadequate for assessing PA pressure.  3. The mitral valve is normal in structure. No evidence of mitral valve regurgitation. No evidence of mitral stenosis.  4. The aortic valve was not well visualized. Aortic valve regurgitation is not visualized. No aortic stenosis is present.  5. Aortic dilatation noted. There is mild dilatation of the ascending aorta, measuring 38 mm.  6. The inferior vena cava is dilated in size with >50% respiratory variability,  suggesting right atrial pressure of 8 mmHg. FINDINGS  Left Ventricle: Left ventricular ejection fraction, by estimation, is 65 to 70%. The left ventricle has normal function. The left ventricle has no regional wall motion abnormalities. Definity contrast agent was given IV to delineate the left ventricular  endocardial borders. The left ventricular internal cavity size was normal in size. There is mild left ventricular hypertrophy. Left ventricular diastolic parameters are consistent with Grade I diastolic dysfunction (impaired relaxation). Right Ventricle: The right ventricular size is normal. No increase in right ventricular wall thickness. Right ventricular systolic function is normal. Tricuspid regurgitation signal is inadequate for assessing PA pressure. Left Atrium: Left atrial size was normal in size. Right Atrium: Right atrial size was normal in size. Pericardium: There is no evidence of pericardial effusion. Presence of epicardial fat layer. Mitral Valve: The mitral valve is normal in structure. No evidence of  mitral valve regurgitation. No evidence of mitral valve stenosis. Tricuspid Valve: The tricuspid valve is not well visualized. Tricuspid valve regurgitation is not demonstrated. Aortic Valve: The aortic valve was not well visualized. Aortic valve regurgitation is not visualized. No aortic stenosis is present. Aortic valve mean gradient measures 6.0 mmHg. Aortic valve peak gradient measures 9.1 mmHg. Aortic valve area, by VTI measures 1.80 cm. Pulmonic Valve: The pulmonic valve was not well visualized. Pulmonic valve regurgitation is not visualized. Aorta: The aortic root is normal in size and structure and aortic dilatation noted. There is mild dilatation of the ascending aorta, measuring 38 mm. Venous: The inferior vena cava is dilated in size with greater than 50% respiratory variability, suggesting right atrial pressure of 8 mmHg. IAS/Shunts: The interatrial septum was not well visualized.  LEFT  VENTRICLE PLAX 2D LVIDd:         3.90 cm   Diastology LVIDs:         2.40 cm   LV e' medial:    5.25 cm/s LV PW:         1.20 cm   LV E/e' medial:  11.8 LV IVS:        1.50 cm   LV e' lateral:   10.10 cm/s LVOT diam:     1.90 cm   LV E/e' lateral: 6.1 LV SV:         51 LV SV Index:   24 LVOT Area:     2.84 cm  RIGHT VENTRICLE             IVC RV Basal diam:  3.10 cm     IVC diam: 2.20 cm RV S prime:     18.00 cm/s TAPSE (M-mode): 2.9 cm LEFT ATRIUM             Index        RIGHT ATRIUM           Index LA diam:        3.90 cm 1.82 cm/m   RA Area:     13.40 cm LA Vol (A2C):   52.7 ml 24.65 ml/m  RA Volume:   31.20 ml  14.59 ml/m LA Vol (A4C):   33.6 ml 15.72 ml/m LA Biplane Vol: 38.1 ml 17.82 ml/m  AORTIC VALVE                     PULMONIC VALVE AV Area (Vmax):    1.67 cm      PV Vmax:       1.01 m/s AV Area (Vmean):   1.67 cm      PV Peak grad:  4.1 mmHg AV Area (VTI):     1.80 cm AV Vmax:           151.00 cm/s AV Vmean:          109.700 cm/s AV VTI:            0.284 m AV Peak Grad:      9.1 mmHg AV Mean Grad:      6.0 mmHg LVOT Vmax:         88.70 cm/s LVOT Vmean:        64.800 cm/s LVOT VTI:          0.180 m LVOT/AV VTI ratio: 0.63  AORTA Ao Root diam: 3.45 cm Ao Asc diam:  3.80 cm Ao Arch diam: 3.3 cm MITRAL VALVE MV Area (PHT): 4.57 cm    SHUNTS MV Decel Time:  166 msec    Systemic VTI:  0.18 m MV E velocity: 61.90 cm/s  Systemic Diam: 1.90 cm MV A velocity: 98.00 cm/s MV E/A ratio:  0.63 Oswaldo Milian MD Electronically signed by Oswaldo Milian MD Signature Date/Time: 09/04/2022/4:53:02 PM    Final    DG CHEST PORT 1 VIEW  Result Date: 09/04/2022 CLINICAL DATA:  Shortness of breath EXAM: PORTABLE CHEST 1 VIEW COMPARISON:  09/03/2022 and prior studies FINDINGS: The cardiomediastinal silhouette is unchanged. Patchy opacities within the mid and LOWER RIGHT lung are again identified as well as subsegmental atelectasis/scarring within the LEFT LOWER lung. There has been little interval change  since the prior study. There is no evidence of pneumothorax or large pleural effusion. IMPRESSION: Little significant change in patchy opacities within the mid and LOWER RIGHT lung and subsegmental atelectasis/scarring within the LEFT LOWER lung. Electronically Signed   By: Margarette Canada M.D.   On: 09/04/2022 10:33   DG Chest Portable 1 View  Result Date: 09/03/2022 CLINICAL DATA:  Shortness of breath EXAM: PORTABLE CHEST 1 VIEW COMPARISON:  04/15/2022 FINDINGS: The heart size and mediastinal contours are within normal limits. Patchy airspace opacities in the periphery of the right lower lobe. Left basilar scarring or atelectasis. No pneumothorax. The visualized skeletal structures are unremarkable. IMPRESSION: Patchy airspace opacities in the periphery of the right lower lobe, suspicious for pneumonia. Radiographic follow-up to resolution is recommended. Electronically Signed   By: Davina Poke D.O.   On: 09/03/2022 12:36    Microbiology: Results for orders placed or performed during the hospital encounter of 09/03/22  Resp panel by RT-PCR (RSV, Flu A&B, Covid) Anterior Nasal Swab     Status: None   Collection Time: 09/03/22 12:00 PM   Specimen: Anterior Nasal Swab  Result Value Ref Range Status   SARS Coronavirus 2 by RT PCR NEGATIVE NEGATIVE Final    Comment: (NOTE) SARS-CoV-2 target nucleic acids are NOT DETECTED.  The SARS-CoV-2 RNA is generally detectable in upper respiratory specimens during the acute phase of infection. The lowest concentration of SARS-CoV-2 viral copies this assay can detect is 138 copies/mL. A negative result does not preclude SARS-Cov-2 infection and should not be used as the sole basis for treatment or other patient management decisions. A negative result may occur with  improper specimen collection/handling, submission of specimen other than nasopharyngeal swab, presence of viral mutation(s) within the areas targeted by this assay, and inadequate number of  viral copies(<138 copies/mL). A negative result must be combined with clinical observations, patient history, and epidemiological information. The expected result is Negative.  Fact Sheet for Patients:  EntrepreneurPulse.com.au  Fact Sheet for Healthcare Providers:  IncredibleEmployment.be  This test is no t yet approved or cleared by the [place] FDA and  has been authorized for detection and/or diagnosis of SARS-CoV-2 by FDA under an Emergency Use Authorization (EUA). This EUA will remain  in effect (meaning this test can be used) for the duration of the COVID-19 declaration under Section 564(b)(1) of the Act, 21 U.S.C.section 360bbb-3(b)(1), unless the authorization is terminated  or revoked sooner.       Influenza A by PCR NEGATIVE NEGATIVE Final   Influenza B by PCR NEGATIVE NEGATIVE Final    Comment: (NOTE) The Xpert Xpress SARS-CoV-2/FLU/RSV plus assay is intended as an aid in the diagnosis of influenza from Nasopharyngeal swab specimens and should not be used as a sole basis for treatment. Nasal washings and aspirates are unacceptable for Xpert Xpress SARS-CoV-2/FLU/RSV testing.  Fact  Sheet for Patients: EntrepreneurPulse.com.au  Fact Sheet for Healthcare Providers: IncredibleEmployment.be  This test is not yet approved or cleared by the [place] FDA and has been authorized for detection and/or diagnosis of SARS-CoV-2 by FDA under an Emergency Use Authorization (EUA). This EUA will remain in effect (meaning this test can be used) for the duration of the COVID-19 declaration under Section 564(b)(1) of the Act, 21 U.S.C. section 360bbb-3(b)(1), unless the authorization is terminated or revoked.     Resp Syncytial Virus by PCR NEGATIVE NEGATIVE Final    Comment: (NOTE) Fact Sheet for Patients: EntrepreneurPulse.com.au  Fact Sheet for Healthcare  Providers: IncredibleEmployment.be  This test is not yet approved or cleared by the [place] FDA and has been authorized for detection and/or diagnosis of SARS-CoV-2 by FDA under an Emergency Use Authorization (EUA). This EUA will remain in effect (meaning this test can be used) for the duration of the COVID-19 declaration under Section 564(b)(1) of the Act, 21 U.S.C. section 360bbb-3(b)(1), unless the authorization is terminated or revoked.  Performed at [facility], [address], [place], [place] [address]   Blood culture (routine x 2)     Status: None (Preliminary result)   Collection Time: 09/03/22  2:46 PM   Specimen: BLOOD RIGHT FOREARM  Result Value Ref Range Status   Specimen Description   Final    BLOOD RIGHT FOREARM Performed at [facility], [address] [place], [place] [address]    Special Requests   Final    BOTTLES DRAWN AEROBIC AND ANAEROBIC Blood Culture adequate volume Performed at [facility], [address] [address], [place], [place] [address]    Culture   Final    NO GROWTH 3 DAYS Performed at [facility], [place] [address] [place], [place] [address]    Report Status PENDING  Incomplete  Blood culture (routine x 2)     Status: None (Preliminary result)   Collection Time: 09/03/22  2:47 PM   Specimen: BLOOD  Result Value Ref Range Status   Specimen Description   Final    BLOOD LEFT ANTECUBITAL Performed at [facility], [place] [address], [place], [place] [address]    Special Requests   Final    BOTTLES DRAWN AEROBIC AND ANAEROBIC Blood Culture adequate volume Performed at [facility], [address], [place], [place] [address]    Culture   Final    NO GROWTH 3 DAYS Performed at [facility], [place] [address], [place], [place] [address]    Report Status PENDING  Incomplete  MRSA Next Gen by PCR, Nasal     Status: None   Collection Time:  09/03/22  3:14 PM   Specimen: Nasal Mucosa; Nasal Swab  Result Value Ref Range Status   MRSA by PCR Next Gen NOT DETECTED NOT DETECTED Final    Comment: (NOTE) The GeneXpert MRSA Assay (FDA approved for NASAL specimens only), is one component of a comprehensive MRSA colonization surveillance program. It is not intended to diagnose MRSA infection nor to guide or monitor treatment for MRSA infections. Test performance is not FDA approved in patients less than 91 years old. Performed at [facility], [place] [address] [place], [place] [address]     Labs: CBC: Recent Labs  Lab 09/03/22 1200 09/04/22 0406 09/05/22 0503 09/06/22 0438  WBC 25.6* 18.4* 12.0* 9.6  NEUTROABS 22.5* 16.9* 8.9* 6.9  HGB 14.0 12.3* 10.6* 11.0*  HCT 40.8 38.0* 33.3* 33.5*  MCV 86.1 90.3 91.2 88.2  PLT 291 246 218 [id number]   Basic Metabolic Panel: Recent Labs  Lab 09/03/22 1200 09/04/22 0406 09/05/22 0503 09/06/22 0438  NA 135 134* 135 135  K 4.0 3.8 3.5 3.4*  CL 99 106 112* 103  CO2 19* 19* 18* 21*  GLUCOSE 121* 139* 91 87  BUN 23 14 14 12  $ CREATININE 1.27* 0.75 0.77 0.76  CALCIUM 9.1 8.2* 7.7* 8.1*  MG  --   --  2.3 2.2  PHOS  --   --  1.7* 2.6   Liver Function Tests: Recent Labs  Lab 09/03/22 1200 09/04/22 0406 09/05/22 0503 09/06/22 0438  AST 24 23 19 19  $ ALT 24 21 18 19  $ ALKPHOS 49 43 41 41  BILITOT 0.7 0.5 0.5 0.7  PROT 6.7 6.5 5.6* 5.9*  ALBUMIN 4.1 3.3* 3.0* 3.5   CBG: No results for input(s): "GLUCAP" in the last 168 hours.  Discharge time spent: {LESS THAN/GREATER [id number] 30 minutes.  Signed: Kerney Elbe, DO [facility] 09/06/2022

## 2022-09-06 NOTE — Progress Notes (Signed)
Reviewed D/c instructions with pt and all questions answered. Pt verbalized understanding. Pt D/C with home meds from pharmacy, home O2, and all belongings in stable condition.

## 2022-09-06 NOTE — Progress Notes (Addendum)
Mobility Specialist - Progress Note   09/06/22 1439  Therapy Vitals  Patient Position (if appropriate) Orthostatic Vitals  Orthostatic Lying   BP- Lying 139/84  Pulse- Lying 94  Orthostatic Sitting  BP- Sitting 135/89  Pulse- Sitting 96  Orthostatic Standing at 0 minutes  BP- Standing at 0 minutes 108/73  Orthostatic Standing at 3 minutes  BP- Standing at 3 minutes 123/88  Oxygen Therapy  O2 Device Room Air  Mobility  Activity Ambulated with assistance in hallway  Level of Assistance Standby assist, set-up cues, supervision of patient - no hands on  Assistive Device Front wheel walker  Distance Ambulated (ft) 260 ft  Activity Response Tolerated well  Mobility Referral Yes  $Mobility charge 1 Mobility   Nurse requested Mobility Specialist to perform oxygen saturation test with pt which includes removing pt from oxygen both at rest and while ambulating.  Below are the results from that testing.     Patient Saturations on Room Air at Rest = spO2 100%  Patient Saturations on Room Air while Ambulating = sp02 86%. Rested and performed pursed lip breathing for 1 minute with sp02 at 94%.  Patient Saturations on 0 Liters of oxygen while Ambulating = sp02 96%  At end of testing pt left in room on 0  Liters of oxygen.  Reported results to nurse.    Pt received in bed and agreeable to mobility. C/o feeling SOB after walking ~270f. O2 at 88%; encouraged pursed lip breaths bringing O2 back up to 94% in a matter of seconds.  Pt to bed after session with all needs met.    Pre-mobility: 94 HR, 135/89(101) BP 100% SpO2 (RA) During mobility: 96 HR, 86% SpO2 (RA) Post-mobility: 123/88 (99) BP,  96% SPO2 (RA)  MSet designer

## 2022-09-06 NOTE — TOC Initial Note (Signed)
Transition of Care [facility]) - Initial/Assessment Note    Patient Details  Name: [name] Holder MRN: [id number] Date of Birth: [birth year]  Transition of [facility]) CM/SW Contact:    Henrietta Dine, RN Phone Number: 09/06/2022, 3:10 PM  Clinical Narrative:                 [facility] consult for home oxygen; spoke w/ pt in room and he agrees to receiving service; pt says he is from home and he plans to return at d/c; he identified POC spouse Ly Stencil [phone]); pt says he has glasses; he does not have dentures or HA; pt says he has Rolator, manual wheel chair, BSC, shower chair; he does not have bars in shower; he denies IPV, food insecurity, or problems paying utilities; spoke w Brenton Grills at [facility] and he says agency can provide services and travel tank will be delivered to room; pt notified and verbalized understanding; agency contact information placed in follow up provider section of d/c instructions; no TOC needs. Expected Discharge Plan: Home/Self Care Barriers to Discharge: No Barriers Identified   Patient Goals and CMS Choice Patient states their goals for this hospitalization and ongoing recovery are:: home CMS Medicare.gov Compare Post Acute Care list provided to:: Patient Choice offered to / list presented to : Patient      Expected Discharge Plan and Services   Discharge Planning Services: CM Consult Post Acute Care Choice: Durable Medical Equipment (home oxygen) Living arrangements for the past 2 months: Single Family Home Expected Discharge Date: 09/06/22               DME Arranged: Oxygen DME Agency: [name] [employer] Date DME Agency Contacted: 09/06/22 Time DME Agency Contacted: [phone] Representative spoke with at DME Agency: Brenton Grills            Prior Living Arrangements/Services Living arrangements for the past 2 months: [place] with:: Spouse Patient language and need for interpreter reviewed:: Yes Do you feel safe going back to  the place where you live?: Yes      Need for Family Participation in Patient Care: Yes (Comment) Care giver support system in place?: Yes (comment) Current home services: DME (Rolator. manual wheel chair, BSC, shower chair) Criminal Activity/Legal Involvement Pertinent to Current Situation/Hospitalization: No - Comment as needed  Activities of Daily Living Home Assistive Devices/Equipment: Walker (specify type) ADL Screening (condition at time of admission) Patient's cognitive ability adequate to safely complete daily activities?: Yes Is the patient deaf or have difficulty hearing?: No Does the patient have difficulty seeing, even when wearing glasses/contacts?: No Does the patient have difficulty concentrating, remembering, or making decisions?: No Patient able to express need for assistance with ADLs?: Yes Does the patient have difficulty dressing or bathing?: No Independently performs ADLs?: Yes (appropriate for developmental age) Does the patient have difficulty walking or climbing stairs?: No Weakness of Legs: None Weakness of Arms/Hands: None  Permission Sought/Granted Permission sought to share information with : Case Manager Permission granted to share information with : Yes, Verbal Permission Granted  Share Information with NAME: Lenor Coffin, RN, CM     Permission granted to share info w Relationship: Nerik Condray (spouse) [phone]     Emotional Assessment Appearance:: Appears stated age Attitude/Demeanor/Rapport: Gracious Affect (typically observed): Accepting Orientation: : Oriented to Self, Oriented to Place, Oriented to  Time, Oriented to Situation Alcohol / Substance Use: Not Applicable Psych Involvement: No (comment)  Admission diagnosis:  CAP (community acquired pneumonia) [J18.9] Community  acquired pneumonia, unspecified laterality [J18.9] Sepsis, due to unspecified organism, unspecified whether acute organ dysfunction present [facility]) [A41.9] Patient  Active Problem List   Diagnosis Date Noted   CAP (community acquired pneumonia) 09/03/2022   Class 2 obesity 09/03/2022   Panic anxiety syndrome 09/02/2022   Therapeutic opioid-induced constipation (OIC) 09/02/2022   Vertigo 06/02/2022   Encounter for general adult medical examination with abnormal findings 10/28/2021   Prostate cancer ([place]) 09/16/2021   SIADH (syndrome of inappropriate ADH production) ([place]) 09/16/2021   Asthma-COPD overlap syndrome 12/19/2019   Anemia of chronic disease 04/20/2019   Sepsis ([place]) 12/28/2018   Hyperglycemia 11/24/2018   Pathologic compression fracture of spine, initial encounter ([place]) 09/13/2018   Cancer associated pain 09/13/2018   Secondary malignant neoplasm of bone and bone marrow ([place]) 08/06/2018   Spinal stenosis in cervical region 04/12/2018   Routine general medical examination at a health care facility 08/29/2015   Hyperlipidemia with target LDL less than 100 12/21/2013   Essential hypertension 05/20/2010   Allergic rhinitis 05/20/2010   Asthma, mild intermittent 05/20/2010   PCP:  [name], MD Pharmacy:   [facility], [place] [address] Phone: [phone] Fax: [phone]  [facility] (Specialty) [place], [place] [id number] [place] [id number] Phone: [phone] Fax: [place], [place] - [address] [place] [address] [place] [address] [place] [address] Phone: [phone] Fax: [place] [address] [place] [address] Phone: [phone] Fax: [phone]     Social Determinants of Health (SDOH) Social History: SDOH Screenings   Food Insecurity: No Food Insecurity (09/06/2022)  Housing: Low Risk  (09/06/2022)  Transportation Needs: No Transportation  Needs (09/06/2022)  Utilities: Not At Risk (09/06/2022)  Alcohol Screen: Low Risk  (02/12/2022)  Depression (PHQ2-9): Low Risk  (04/14/2022)  Financial Resource Strain: Low Risk  (02/12/2022)  Physical Activity: Inactive (02/12/2022)  Social Connections: Socially Integrated (02/12/2022)  Stress: No Stress Concern Present (02/12/2022)  Tobacco Use: Medium Risk (09/03/2022)   SDOH Interventions: Food Insecurity Interventions: Inpatient TOC Housing Interventions: Inpatient TOC Transportation Interventions: Inpatient TOC Utilities Interventions: Inpatient TOC   Readmission Risk Interventions    09/06/2022    3:06 PM  Readmission Risk Prevention Plan  Transportation Screening Complete  PCP or Specialist Appt within 3-5 Days Complete  HRI or [place] Complete  Social Work Consult for [place] Planning/Counseling Complete  Palliative Care Screening Not Applicable  Medication Review Press photographer) Complete

## 2022-09-06 NOTE — Evaluation (Signed)
Physical Therapy Evaluation Patient Details Name: [name] Holder MRN: [id number] DOB: 1956-06-18 Today's Date: 09/06/2022  History of Present Illness  Pt admitted from home dx with CAP with sepsis and with hx of asthma, osteopenis, prostate CA with pelvic mets, bladder CA, and t10-T12 compression fx.  Clinical Impression  Pt admitted as above and presenting with functional mobility limitations 2* decreased endurance and mild ambulatory balance deficits.  Pt motivated and should progress to dc home with family assist.     Recommendations for follow up therapy are one component of a multi-disciplinary discharge planning process, led by the attending physician.  Recommendations may be updated based on patient status, additional functional criteria and insurance authorization.  Follow Up Recommendations No PT follow up      Assistance Recommended at Discharge PRN  Patient can return home with the following  Help with stairs or ramp for entrance;Assist for transportation;Assistance with cooking/housework    Equipment Recommendations None recommended by PT  Recommendations for Other Services       Functional Status Assessment Patient has had a recent decline in their functional status and demonstrates the ability to make significant improvements in function in a reasonable and predictable amount of time.     Precautions / Restrictions Precautions Precautions: Fall Restrictions Weight Bearing Restrictions: No      Mobility  Bed Mobility Overal bed mobility: Modified Independent             General bed mobility comments: no physical assist supine<>sit    Transfers Overall transfer level: Needs assistance   Transfers: Sit to/from Stand Sit to Stand: Supervision           General transfer comment: Supervision for safety only    Ambulation/Gait Ambulation/Gait assistance: Min guard, Supervision Gait Distance (Feet): 200 Feet Assistive device: Rolling walker (2  wheels) Gait Pattern/deviations: Step-to pattern, Step-through pattern, Decreased step length - right, Decreased step length - left, Shuffle, Trunk flexed Gait velocity: moderate pace     General Gait Details: min cues for posture and position from RW; mild SOB only with pt on 2L O2  Stairs            Wheelchair Mobility    Modified Rankin (Stroke Patients Only)       Balance Overall balance assessment: Needs assistance Sitting-balance support: Feet supported, No upper extremity supported Sitting balance-Leahy Scale: Good     Standing balance support: No upper extremity supported Standing balance-Leahy Scale: Fair                               Pertinent Vitals/Pain Pain Assessment Pain Assessment: No/denies pain    Home Living Family/patient expects to be discharged to:: Private residence Living Arrangements: Spouse/significant other Available Help at Discharge: Family;Available 24 hours/day Type of Home: House Home Access: Ramped entrance       Home Layout: One level Home Equipment: Conservation officer, nature (2 wheels);Rollator (4 wheels);Wheelchair - manual;BSC/3in1      Prior Function Prior Level of Function : Independent/Modified Independent             Mobility Comments: Ind with rollator ADLs Comments: Ind with ADL     Hand Dominance   Dominant Hand: Right    Extremity/Trunk Assessment   Upper Extremity Assessment Upper Extremity Assessment: Overall WFL for tasks assessed    Lower Extremity Assessment Lower Extremity Assessment: Overall WFL for tasks assessed       Communication  Communication: No difficulties  Cognition Arousal/Alertness: Awake/alert Behavior During Therapy: WFL for tasks assessed/performed Overall Cognitive Status: Within Functional Limits for tasks assessed                                          General Comments      Exercises     Assessment/Plan    PT Assessment Patient needs  continued PT services  PT Problem List Decreased strength;Decreased activity tolerance;Decreased balance       PT Treatment Interventions DME instruction;Gait training;Stair training;Functional mobility training;Therapeutic activities;Therapeutic exercise;Patient/family education    PT Goals (Current goals can be found in the Care Plan section)  Acute Rehab PT Goals Patient Stated Goal: HOME tomorrow PT Goal Formulation: With patient Time For Goal Achievement: 09/20/22 Potential to Achieve Goals: Good    Frequency Min 3X/week     Co-evaluation               AM-PAC PT "6 Clicks" Mobility  Outcome Measure Help needed turning from your back to your side while in a flat bed without using bedrails?: None Help needed moving from lying on your back to sitting on the side of a flat bed without using bedrails?: None Help needed moving to and from a bed to a chair (including a wheelchair)?: A Little Help needed standing up from a chair using your arms (e.g., wheelchair or bedside chair)?: A Little Help needed to walk in hospital room?: A Little Help needed climbing 3-5 steps with a railing? : A Little 6 Click Score: 20    End of Session Equipment Utilized During Treatment: Oxygen (2L) Activity Tolerance: Patient tolerated treatment well Patient left: in bed;with call bell/phone within reach Nurse Communication: Mobility status PT Visit Diagnosis: Difficulty in walking, not elsewhere classified (R26.2)    Time: [id number] PT Time Calculation (min) (ACUTE ONLY): 22 min   Charges:   PT Evaluation $PT Eval Low Complexity: 1 Low          [place] Pager [phone] Office [phone]   Jesselee Poth 09/06/2022, 12:52 PM

## 2022-09-06 NOTE — Telephone Encounter (Signed)
Please schedule patient for hospital follow-up with Dr. Valeta Harms or NP in 1 week

## 2022-09-07 ENCOUNTER — Telehealth: Payer: Self-pay

## 2022-09-07 NOTE — Telephone Encounter (Signed)
Spoke to pt and made first available appt. with Roxan Diesel, NP for hosp f/up. Pt verbalized understanding. Nothing further needed.

## 2022-09-07 NOTE — Transitions of Care (Post Inpatient/ED Visit) (Signed)
   09/07/2022  Name: [name] Holder MRN: [id number] DOB: 03-29-1956  Today's TOC FU Call Status: Today's TOC FU Call Status:: Successful TOC FU Call Competed TOC FU Call Complete Date: 09/07/22  Transition Care Management Follow-up Telephone Call Date of Discharge: 09/06/22 Discharge Facility: [facility] Type of Discharge: Inpatient Admission Primary Inpatient Discharge Diagnosis:: Community Acquired Pneumonia How have you been since you were released from the hospital?: Better Any questions or concerns?: No  Items Reviewed: Did you receive and understand the discharge instructions provided?: Yes Medications obtained and verified?: Yes (Medications Reviewed) Any new allergies since your discharge?: No Dietary orders reviewed?: Yes Type of Diet Ordered:: Heart Healthy Do you have support at home?: Yes People in Home: spouse Name of Support/Comfort Primary Source: [facility] and Equipment/Supplies: [place] Ordered?: No Any new equipment or medical supplies ordered?: Yes (Oxygen) Name of Medical supply agency?: [employer] Were you able to get the equipment/medical supplies?: Yes Do you have any questions related to the use of the equipment/supplies?: No  Functional Questionnaire: Do you need assistance with bathing/showering or dressing?: Yes Do you need assistance with meal preparation?: Yes Do you need assistance with eating?: No Do you have difficulty maintaining continence: No Do you need assistance with getting out of bed/getting out of a chair/moving?: No Do you have difficulty managing or taking your medications?: No  Folllow up appointments reviewed: PCP Follow-up appointment confirmed?: Yes Date of PCP follow-up appointment?: 09/09/22 Follow-up Provider: Claudia Pollock NP [facility] Follow-up appointment confirmed?: Yes Date of Specialist follow-up appointment?: 09/14/22 Follow-Up Specialty Provider:: Dr. Burr Medico- oncology Do you need  transportation to your follow-up appointment?: No Do you understand care options if your condition(s) worsen?: Yes-patient verbalized understanding  SDOH Interventions Today    Flowsheet Row Most Recent Value  SDOH Interventions   Food Insecurity Interventions Intervention Not Indicated  Housing Interventions Intervention Not Indicated       Johnney Killian, RN, BSN, CCM Care Management Coordinator [facility] Phone: [phone]: [phone]

## 2022-09-08 DIAGNOSIS — M545 Low back pain, unspecified: Secondary | ICD-10-CM | POA: Insufficient documentation

## 2022-09-08 LAB — CULTURE, BLOOD (ROUTINE X 2)
Culture: NO GROWTH
Culture: NO GROWTH
Special Requests: ADEQUATE
Special Requests: ADEQUATE

## 2022-09-08 NOTE — Assessment & Plan Note (Addendum)
-  stage IV with node metastasis, Guardant 360 (-) in 2022 -diagnosed in 2014 with lymphadenopathy, Gleason score 7 and PSA of 23.  -He developed castration resistant in 2018.  His PSA was up to 11.3 at that time.  -He is status post T10, T12, kyphoplasty (osteoprosis related) completed on January 22 of 2020.  -he has been on Zytiga 1000 mg daily with prednisone at 5 mg daily started in 2018, Eligard 22.5 mg every 3 months and Prolia 60 mg every 6 months.  -Tolerating treatment well. His PSA has been slowly trending up with latest 1.5 in Jan 2024, with doubling time more than 6 months  -He was recently hospitalized for pneumonia and sepsis, hospital course reviewed.  He started recovering.

## 2022-09-08 NOTE — Discharge Summary (Signed)
Physician Discharge Summary   Patient: [name] Holder MRN: [id number] DOB: 1956-02-08  Admit date:     09/03/2022  Discharge date: 09/06/2022  Discharge Physician: Kerney Elbe   PCP: [name], MD   Recommendations at discharge:   Follow-up with PCP within 1 to 2 weeks and repeat CBC, CMP, Mag, Phos within 1 week Follow-up with pulmonology Dr. Valeta Harms or pulmonary nurse practitioner within 1 to 2 weeks Follow-up with cardiology in outpatient setting Repeat chest x-ray in 3 to 6 weeks  Discharge Diagnoses: Principal Problem:   CAP (community acquired pneumonia) Active Problems:   Essential hypertension   Hyperlipidemia with target LDL less than 100   Secondary malignant neoplasm of bone and bone marrow (HCC)   Sepsis ([name])   Asthma-COPD overlap syndrome   Prostate cancer ([place])   Class 2 obesity  Resolved Problems:   * No resolved hospital problems. [facility] Course: The patient is a 67 year old Caucasian obese male with past medical history significant for but not limited to allergic rhinitis, prostate cancer and bladder cancer, chronic constipation, chronic lower back pain with a history of vertebral compression fracture T12 and T10, urolithiasis, history of pneumonia, history of colonic polyps, BPH, hypertension, mild persistent asthma, osteopenia as well as other comorbidities who presented to the ED with generalized weakness associated with the right lower lobe pleuritic chest pain, dyspnea cough as well as wheezing, fatigue, chills and fever since yesterday morning. Patient thinks he may have aspirated sputum earlier in the day and he denies any rhinorrhea discomfort or hemoptysis. No other complaints. Given his weakness and shortness of breath he presented to the ED. Patient also states that he had a fall yesterday and does not know how he "fell and did not know if he passed out". Upon arrival to the ED his CBC showed a white blood cell count 25,000 hemoglobin 14.0.  Troponin and BNP were normal. Coronavirus, influenza AMB as well as RSV PCR was negative. Chest x-ray was done which showed patchy airspace opacities in the periphery of the right lower lobe suspicious for pneumonia. He is saturating well on room air and was admitted for evaluation and management of sepsis secondary to community-acquired pneumonia/right lower lobe pneumonia in setting of asthma and COPD.   He is slowly improving but continues to be dyspneic and so he will be given a dose of IV Lasix 20 mg and resumed on his home dose.  Will add Xopenex and Atrovent as well as Budesonide and Brovana and continue flutter valve and incentive spirometry.  X-ray done and showed unchanged appearance of the chest with the right lung opacities and pneumonia.  Assessment and Plan:  Sepsis ([place]) due to CAP (community acquired pneumonia) superimposed on Asthma-COPD overlap syndrome with acute respiratory failure with hypoxia -Admit to PCU/inpatient. -Met sepsis criteria on admission given that he was tachypneic, had a leukocytosis and also had a source of infection -Continue supplemental oxygen. -SpO2: 98 % O2 Flow Rate (L/min): 2 L/min -IV hydration has now stopped -Follow-up lactic acid level now improved and will not -C/w Scheduled and as needed bronchodilators and have now added budesonide 0.25 mg nebs twice daily as well as arformoterol 15 mcg nebs twice daily -Continue Ceftriaxone 1 g IVPB daily and Continue Azithromycin 500 mg IVPB daily consider changing to IV Unasyn for aspiration but will change to p.o. Augmentin for discharge -Continue guaifenesin 1200 mg p.o. twice daily, flutter valve and incentive spirometry -Single dose methylprednisolone 40 mg given. -C/w  prednisone to 10 mg daily but may need to go back on IV Steroids if still dyspneic.  -Check strep pneumoniae urinary antigen. -Check sputum Gram stain, culture and sensitivity. -Ambulatory home O2 screen done patient did desaturate and we  provided 2 L -Resume home diuretics -Repeat CXR in outpatient setting in 3 to 6 weeks; Repeat CXR done and showed "Slightly increased lingular atelectasis. Stable right basilar airspace opacities likely reflecting pneumonia." -Follow-up blood culture and sensitivity. -WBC Trend: Recent Labs  Lab 08/11/22 1109 09/03/22 1200 09/04/22 0406 09/05/22 0503 09/06/22 0438  WBC 10.4 25.6* 18.4* 12.0* 9.6  -Follow-up CBC a in the outpatient setting given that his WBC is resolved -Follow-up with pulmonology in outpatient setting as well as PCP within 1 to 2 weeks and repeat chest x-ray in 3 to 6 weeks   Essential Hypertension -Resume Furosemide for now. -Continue Olmesartan 40 mg p.o. daily substitution while hospitalized -Continue to Monitor BP per Protocol -Last BP reading was elevated at 160/87   Prostate cancer [facility]) Secondary malignant neoplasm of bone and bone marrow ([place]) -Hide whole Zytiga until the patient recovers from pneumonia. -Follow-up with new oncologist now that Dr. Alen Blew is no longer at the cancer center.   Lactic Acidosis -In the setting of infection -Patient's lactic acid level went from 2.5 -> 2.1 -> 3.4 -> 2.0 on last check -IVF now stopped and will not trend LA anymore    Hyperlipidemia with target LDL less than 100 -Currently not on medical therapy. -Follow-up with primary care physician in outpatient setting   Chronic diastolic CHF, currently not in exacerbation -Echo this visit showed EF showed grade 1 diastolic dysfunction with normal EF of 65 to 70% -EF was 79.2 -Does not appear volume overloaded on examination -He is +1.513 L since admission -Resume his home diuresis today but will give an extra dose of IV Lasix 20 mg x 1 yesterday -Strict I's and O's and daily weights 6-continue to monitor for signs and symptoms of volume overload and check chest x-ray in the morning   Fall with ? Etiology likely vasovagal syncope -Patient states that prior to  admission he had a fall and does not know how he fell.  Unclear etiology if this is related to syncope so we will get an echocardiogram -Obtain PT and OT to further evaluate and treat and check orthostatics; PT OT identified no needs and have signed off the case given that he is ambulating in the hall with the mobility tech -Patient was a little orthostatic (minimal symptoms) and was provided TED hose but did well after the 3-minute mark -Check TSH and was 1.025 -Echocardiogram done and showed a left ventricular ejection fraction of 65 to 70% with no regional wall motion abnormalities as well as a mild left ventricular hypertrophy and left ventricular diastolic parameters consistent with grade 1 diastolic dysfunction -Follow up with PCP    AKI, improved -BUN/Cr Trend: Recent Labs  Lab 08/11/22 1109 09/03/22 1200 09/03/22 1200 09/04/22 0406 09/05/22 0503 09/06/22 0438  BUN 16 23  --  14 14 12  $ CREATININE 0.84 1.27*   < > 0.75 0.77 0.76   < > = values in this interval not displayed.  -Improved with IVF Hydration with NS at 100 mL/hr but will stop IVF and resumed home Furosemide; will not give any more IV Lasix -Avoid Nephrotoxic Medications, Contrast Dyes, Hypotension and Dehydration to Ensure Adequate Renal Perfusion and will need to Renally Adjust Meds -Continue to Monitor and Trend Renal Function  carefully and repeat CMP in the AM    Normocytic Anemia  -? Hemoconcentration on Admission and Dilutional Drop -Hgb/Hct Trend: Last Labs        Recent Labs  Lab 08/11/22 1109 09/03/22 1200 09/04/22 0406 09/05/22 0503  HGB 12.6* 14.0 12.3* 10.6*  HCT 36.5* 40.8 38.0* 33.3*  MCV 87.3 86.1 90.3 91.2    -Checked Anemia Panel and showed an iron level of 58, UIBC of 194, TIBC of 252, saturation is a 23%, ferritin level of 25, folate of 23.3, vitamin B12 470 -Continue to Monitor for S/Sx of Bleeding: No overt bleeding noted -Repeat CBC within 1 week   Metabolic Acidosis -Mild. Patient's  CO2 is now 21, anion gap is 11, chloride level is now 103 -Started sodium bicarb tabs and will continue for a few days -Continue to Monitor and Trend and Repeat CMP in the AM    Hypoalbuminemia -Patient's Albumin Trend: Recent Labs  Lab 08/11/22 1109 09/03/22 1200 09/04/22 0406 09/05/22 0503 09/06/22 0438  ALBUMIN 3.9 4.1 3.3* 3.0* 3.5  -Continue to Monitor and Trend and repeat CMP within 1 week   Class 2 Obesity -Complicates overall prognosis and care -Estimated body mass index is 35.71 kg/m as calculated from the following:   Height as of this encounter: 5' 7"$  (1.702 m).   Weight as of this encounter: 103.4 kg.  -Weight Loss and Dietary Counseling given   Consultants: None Procedures performed: As delineated as above, echo Disposition: Home Diet recommendation:  Discharge Diet Orders (From admission, onward)     Start     Ordered   09/06/22 0000  Diet - low sodium heart healthy        09/06/22 1438           Cardiac and Carb modified diet DISCHARGE MEDICATION: Allergies as of 09/06/2022       Reactions   Amlodipine Swelling   edema   Tetracycline Rash        Medication List     TAKE these medications    abiraterone acetate 250 MG tablet Commonly known as: ZYTIGA Take 4 tablets (1,000 mg total) by mouth daily.   acetaminophen 325 MG tablet Commonly known as: TYLENOL Take 325 mg by mouth daily as needed for fever.   albuterol (2.5 MG/3ML) 0.083% nebulizer solution Commonly known as: PROVENTIL Take 3 mLs (2.5 mg total) by nebulization every 6 (six) hours as needed for wheezing or shortness of breath.   albuterol 108 (90 Base) MCG/ACT inhaler Commonly known as: VENTOLIN HFA Inhale 2 puffs into the lungs every 6 (six) hours as needed for wheezing or shortness of breath.   amoxicillin-clavulanate 875-125 MG tablet Commonly known as: AUGMENTIN Take 1 tablet by mouth 2 (two) times daily for 3 days.   ascorbic acid 500 MG tablet Commonly known as:  VITAMIN C Take 500 mg by mouth every evening.   azelastine 0.1 % nasal spray Commonly known as: ASTELIN Place 2 puffs into the nose in the morning and at bedtime.   CALCIUM-VITAMIN D PO Take 1 tablet by mouth 2 (two) times daily.   carvedilol 3.125 MG tablet Commonly known as: COREG TAKE 1 TABLET 2 TIMES A DAY WITH BREAKFAST & SUPPER   CENTRUM SILVER 50+MEN PO Take 1 tablet by mouth daily.   denosumab 60 MG/ML Sosy injection Commonly known as: PROLIA Inject 60 mg into the skin every 6 (six) months.   diazepam 2 MG tablet Commonly known as: Valium Take 1 tablet (2 mg  total) by mouth every 8 (eight) hours as needed for anxiety.   fluticasone 50 MCG/ACT nasal spray Commonly known as: FLONASE Place 2 sprays into both nostrils daily.   furosemide 20 MG tablet Commonly known as: LASIX TAKE ONE TABLET TWICE DAILY   guaiFENesin 600 MG 12 hr tablet Commonly known as: MUCINEX Take 1 tablet (600 mg total) by mouth 2 (two) times daily for 5 days.   leuprolide 11.25 MG injection Commonly known as: LUPRON Inject 11.25 mg into the muscle every 3 (three) months.   lubiprostone 24 MCG capsule Commonly known as: AMITIZA Take 1 capsule (24 mcg total) by mouth 2 (two) times daily with a meal.   meclizine 25 MG tablet Commonly known as: ANTIVERT Take 1 tablet (25 mg total) by mouth 3 (three) times daily as needed for dizziness.   Narcan 4 MG/0.1ML Liqd nasal spray kit Generic drug: naloxone Place 0.4 mg into the nose once as needed (overdose).   olmesartan 40 MG tablet Commonly known as: BENICAR TAKE ONE TABLET ONCE DAILY   ondansetron 4 MG tablet Commonly known as: ZOFRAN Take 1 tablet (4 mg total) by mouth every 6 (six) hours as needed for nausea.   oxyCODONE 5 MG immediate release tablet Commonly known as: Oxy IR/ROXICODONE Take 1 tablet (5 mg total) by mouth every 4 (four) hours as needed for severe pain.   PEPCID AC PO Take 1 tablet by mouth at bedtime.    polyethylene glycol powder 17 GM/SCOOP powder Commonly known as: GLYCOLAX/MIRALAX Take 17 g (1 capful) by mouth daily as directed.   predniSONE 5 MG tablet Commonly known as: DELTASONE TAKE 1 TABLET 2 TIMES A DAY   sodium bicarbonate 650 MG tablet Take 1 tablet (650 mg total) by mouth 2 (two) times daily for 3 days.   sodium chloride 1 g tablet Take 1 g by mouth 2 (two) times daily with a meal.   solifenacin 5 MG tablet Commonly known as: VESICARE Take 5 mg by mouth daily.   Trelegy Ellipta 100-62.5-25 MCG/ACT Aepb Generic drug: Fluticasone-Umeclidin-Vilant Inhale 1 puff into the lungs daily.   Vista Advanced Carotenoid Caps Take 1 capsule by mouth every evening.   vitamin E 180 MG (400 UNITS) capsule Take 400 Units by mouth every evening.        Follow-up Information     [facility] up.   Contact information: [address] [place] [id number] [phone]                Discharge Exam: Filed Weights   09/03/22 1142  Weight: 103.4 kg   Vitals:   09/06/22 0735 09/06/22 1341  BP:    Pulse:    Resp:    Temp:    SpO2: 96% 98%   Examination: Physical Exam:  Constitutional: WN/WD obese Caucasian male currently no acute distress Respiratory: Diminished to auscultation bilaterally with coarse breath sounds and had some slight rhonchi and crackles on the right side compared to left, no appreciable wheezing or rales. Normal respiratory effort and patient is not tachypenic. No accessory muscle use.  Wearing 2 L of supplemental oxygen via nasal cannula given his desaturation Cardiovascular: RRR, no murmurs / rubs / gallops. S1 and S2 auscultated.  Trace extremity edema Abdomen: Soft, non-tender, distended secondary to body habitus. Bowel sounds positive.  GU: Deferred. Musculoskeletal: No clubbing / cyanosis of digits/nails. No joint deformity upper and lower extremities.  Skin: No rashes, lesions, ulcers on limited skin evaluation. No  induration; Warm and dry.  Neurologic: CN 2-12 grossly intact with no focal deficits. Romberg sign and cerebellar reflexes not assessed.  Psychiatric: Normal judgment and insight. Alert and oriented x 3. Normal mood and appropriate affect.   Condition at discharge: stable  The results of significant diagnostics from this hospitalization (including imaging, microbiology, ancillary and laboratory) are listed below for reference.   Imaging Studies: DG CHEST PORT 1 VIEW  Result Date: 09/06/2022 CLINICAL DATA:  Short of breath EXAM: PORTABLE CHEST 1 VIEW COMPARISON:  Prior chest x-ray yesterday at 5:46 a.m. FINDINGS: Stable cardiac and mediastinal contours. Slightly increased linear airspace opacities in the lingula most consistent with areas of atelectasis. Similar appearance of patchy airspace opacities in the right lung base consistent with pneumonia. No pneumothorax. No new pleural effusion. Chronic bronchitic changes are stable. No acute osseous abnormality. IMPRESSION: Slightly increased lingular atelectasis. Stable right basilar airspace opacities likely reflecting pneumonia. Electronically Signed   By: Jacqulynn Cadet M.D.   On: 09/06/2022 08:25   DG CHEST PORT 1 VIEW  Result Date: 09/05/2022 CLINICAL DATA:  Shortness of breath EXAM: PORTABLE CHEST 1 VIEW COMPARISON:  09/04/2022 prior studies FINDINGS: Cardiomediastinal silhouette is unchanged. Peripheral and basilar opacities within the RIGHT lung are not significantly changed. Subsegmental atelectasis/scarring within the LOWER LEFT lung again noted. No pneumothorax or large pleural effusion identified. IMPRESSION: Unchanged appearance of the chest with RIGHT lung opacities/pneumonia. Electronically Signed   By: Margarette Canada M.D.   On: 09/05/2022 08:58   ECHOCARDIOGRAM COMPLETE  Result Date: 09/04/2022    ECHOCARDIOGRAM REPORT   Patient Name:   [name] Holder Date of Exam: 09/04/2022 Medical Rec #:  [id number]         Height:       67.0 in  Accession #:    [id number]        Weight:       228.0 lb Date of Birth:  [birth year]         BSA:          2.138 m Patient Age:    39 years          BP:           130/70 mmHg Patient Gender: M                 HR:           98 bpm. Exam Location:  Inpatient Procedure: 2D Echo, Cardiac Doppler, Color Doppler and Intracardiac            Opacification Agent Indications:    R55 Syncope  History:        Patient has no prior history of Echocardiogram examinations.                 COPD, Signs/Symptoms:Syncope; Risk Factors:Former Smoker,                 Dyslipidemia and Hypertension.  Sonographer:    Wilkie Aye RVT RCS Referring Phys: [phone] [facility] LATIF [facility]  Sonographer Comments: Technically difficult study due to poor echo windows, suboptimal parasternal window, suboptimal apical window and suboptimal subcostal window. IMPRESSIONS  1. Left ventricular ejection fraction, by estimation, is 65 to 70%. The left ventricle has normal function. The left ventricle has no regional wall motion abnormalities. There is mild left ventricular hypertrophy. Left ventricular diastolic parameters are consistent with Grade I diastolic dysfunction (impaired relaxation).  2. Right ventricular systolic function is normal. The right ventricular size is normal. Tricuspid  regurgitation signal is inadequate for assessing PA pressure.  3. The mitral valve is normal in structure. No evidence of mitral valve regurgitation. No evidence of mitral stenosis.  4. The aortic valve was not well visualized. Aortic valve regurgitation is not visualized. No aortic stenosis is present.  5. Aortic dilatation noted. There is mild dilatation of the ascending aorta, measuring 38 mm.  6. The inferior vena cava is dilated in size with >50% respiratory variability, suggesting right atrial pressure of 8 mmHg. FINDINGS  Left Ventricle: Left ventricular ejection fraction, by estimation, is 65 to 70%. The left ventricle has normal function. The left ventricle has no  regional wall motion abnormalities. Definity contrast agent was given IV to delineate the left ventricular  endocardial borders. The left ventricular internal cavity size was normal in size. There is mild left ventricular hypertrophy. Left ventricular diastolic parameters are consistent with Grade I diastolic dysfunction (impaired relaxation). Right Ventricle: The right ventricular size is normal. No increase in right ventricular wall thickness. Right ventricular systolic function is normal. Tricuspid regurgitation signal is inadequate for assessing PA pressure. Left Atrium: Left atrial size was normal in size. Right Atrium: Right atrial size was normal in size. Pericardium: There is no evidence of pericardial effusion. Presence of epicardial fat layer. Mitral Valve: The mitral valve is normal in structure. No evidence of mitral valve regurgitation. No evidence of mitral valve stenosis. Tricuspid Valve: The tricuspid valve is not well visualized. Tricuspid valve regurgitation is not demonstrated. Aortic Valve: The aortic valve was not well visualized. Aortic valve regurgitation is not visualized. No aortic stenosis is present. Aortic valve mean gradient measures 6.0 mmHg. Aortic valve peak gradient measures 9.1 mmHg. Aortic valve area, by VTI measures 1.80 cm. Pulmonic Valve: The pulmonic valve was not well visualized. Pulmonic valve regurgitation is not visualized. Aorta: The aortic root is normal in size and structure and aortic dilatation noted. There is mild dilatation of the ascending aorta, measuring 38 mm. Venous: The inferior vena cava is dilated in size with greater than 50% respiratory variability, suggesting right atrial pressure of 8 mmHg. IAS/Shunts: The interatrial septum was not well visualized.  LEFT VENTRICLE PLAX 2D LVIDd:         3.90 cm   Diastology LVIDs:         2.40 cm   LV e' medial:    5.25 cm/s LV PW:         1.20 cm   LV E/e' medial:  11.8 LV IVS:        1.50 cm   LV e' lateral:   10.10  cm/s LVOT diam:     1.90 cm   LV E/e' lateral: 6.1 LV SV:         51 LV SV Index:   24 LVOT Area:     2.84 cm  RIGHT VENTRICLE             IVC RV Basal diam:  3.10 cm     IVC diam: 2.20 cm RV S prime:     18.00 cm/s TAPSE (M-mode): 2.9 cm LEFT ATRIUM             Index        RIGHT ATRIUM           Index LA diam:        3.90 cm 1.82 cm/m   RA Area:     13.40 cm LA Vol (A2C):   52.7 ml 24.65 ml/m  RA Volume:  31.20 ml  14.59 ml/m LA Vol (A4C):   33.6 ml 15.72 ml/m LA Biplane Vol: 38.1 ml 17.82 ml/m  AORTIC VALVE                     PULMONIC VALVE AV Area (Vmax):    1.67 cm      PV Vmax:       1.01 m/s AV Area (Vmean):   1.67 cm      PV Peak grad:  4.1 mmHg AV Area (VTI):     1.80 cm AV Vmax:           151.00 cm/s AV Vmean:          109.700 cm/s AV VTI:            0.284 m AV Peak Grad:      9.1 mmHg AV Mean Grad:      6.0 mmHg LVOT Vmax:         88.70 cm/s LVOT Vmean:        64.800 cm/s LVOT VTI:          0.180 m LVOT/AV VTI ratio: 0.63  AORTA Ao Root diam: 3.45 cm Ao Asc diam:  3.80 cm Ao Arch diam: 3.3 cm MITRAL VALVE MV Area (PHT): 4.57 cm    SHUNTS MV Decel Time: 166 msec    Systemic VTI:  0.18 m MV E velocity: 61.90 cm/s  Systemic Diam: 1.90 cm MV A velocity: 98.00 cm/s MV E/A ratio:  0.63 Oswaldo Milian MD Electronically signed by Oswaldo Milian MD Signature Date/Time: 09/04/2022/4:53:02 PM    Final    DG CHEST PORT 1 VIEW  Result Date: 09/04/2022 CLINICAL DATA:  Shortness of breath EXAM: PORTABLE CHEST 1 VIEW COMPARISON:  09/03/2022 and prior studies FINDINGS: The cardiomediastinal silhouette is unchanged. Patchy opacities within the mid and LOWER RIGHT lung are again identified as well as subsegmental atelectasis/scarring within the LEFT LOWER lung. There has been little interval change since the prior study. There is no evidence of pneumothorax or large pleural effusion. IMPRESSION: Little significant change in patchy opacities within the mid and LOWER RIGHT lung and subsegmental  atelectasis/scarring within the LEFT LOWER lung. Electronically Signed   By: Margarette Canada M.D.   On: 09/04/2022 10:33   DG Chest Portable 1 View  Result Date: 09/03/2022 CLINICAL DATA:  Shortness of breath EXAM: PORTABLE CHEST 1 VIEW COMPARISON:  04/15/2022 FINDINGS: The heart size and mediastinal contours are within normal limits. Patchy airspace opacities in the periphery of the right lower lobe. Left basilar scarring or atelectasis. No pneumothorax. The visualized skeletal structures are unremarkable. IMPRESSION: Patchy airspace opacities in the periphery of the right lower lobe, suspicious for pneumonia. Radiographic follow-up to resolution is recommended. Electronically Signed   By: Davina Poke D.O.   On: 09/03/2022 12:36    Microbiology: Results for orders placed or performed during the hospital encounter of 09/03/22  Resp panel by RT-PCR (RSV, Flu A&B, Covid) Anterior Nasal Swab     Status: None   Collection Time: 09/03/22 12:00 PM   Specimen: Anterior Nasal Swab  Result Value Ref Range Status   SARS Coronavirus 2 by RT PCR NEGATIVE NEGATIVE Final    Comment: (NOTE) SARS-CoV-2 target nucleic acids are NOT DETECTED.  The SARS-CoV-2 RNA is generally detectable in upper respiratory specimens during the acute phase of infection. The lowest concentration of SARS-CoV-2 viral copies this assay can detect is 138 copies/mL. A negative result does not preclude SARS-Cov-2  infection and should not be used as the sole basis for treatment or other patient management decisions. A negative result may occur with  improper specimen collection/handling, submission of specimen other than nasopharyngeal swab, presence of viral mutation(s) within the areas targeted by this assay, and inadequate number of viral copies(<138 copies/mL). A negative result must be combined with clinical observations, patient history, and epidemiological information. The expected result is Negative.  Fact Sheet for  Patients:  EntrepreneurPulse.com.au  Fact Sheet for Healthcare Providers:  IncredibleEmployment.be  This test is no t yet approved or cleared by the [place] FDA and  has been authorized for detection and/or diagnosis of SARS-CoV-2 by FDA under an Emergency Use Authorization (EUA). This EUA will remain  in effect (meaning this test can be used) for the duration of the COVID-19 declaration under Section 564(b)(1) of the Act, 21 U.S.C.section 360bbb-3(b)(1), unless the authorization is terminated  or revoked sooner.       Influenza A by PCR NEGATIVE NEGATIVE Final   Influenza B by PCR NEGATIVE NEGATIVE Final    Comment: (NOTE) The Xpert Xpress SARS-CoV-2/FLU/RSV plus assay is intended as an aid in the diagnosis of influenza from Nasopharyngeal swab specimens and should not be used as a sole basis for treatment. Nasal washings and aspirates are unacceptable for Xpert Xpress SARS-CoV-2/FLU/RSV testing.  Fact Sheet for Patients: EntrepreneurPulse.com.au  Fact Sheet for Healthcare Providers: IncredibleEmployment.be  This test is not yet approved or cleared by the [place] FDA and has been authorized for detection and/or diagnosis of SARS-CoV-2 by FDA under an Emergency Use Authorization (EUA). This EUA will remain in effect (meaning this test can be used) for the duration of the COVID-19 declaration under Section 564(b)(1) of the Act, 21 U.S.C. section 360bbb-3(b)(1), unless the authorization is terminated or revoked.     Resp Syncytial Virus by PCR NEGATIVE NEGATIVE Final    Comment: (NOTE) Fact Sheet for Patients: EntrepreneurPulse.com.au  Fact Sheet for Healthcare Providers: IncredibleEmployment.be  This test is not yet approved or cleared by the [place] FDA and has been authorized for detection and/or diagnosis of SARS-CoV-2 by FDA under an Emergency  Use Authorization (EUA). This EUA will remain in effect (meaning this test can be used) for the duration of the COVID-19 declaration under Section 564(b)(1) of the Act, 21 U.S.C. section 360bbb-3(b)(1), unless the authorization is terminated or revoked.  Performed at [facility], [address], [place], [place] [address]   Blood culture (routine x 2)     Status: None   Collection Time: 09/03/22  2:46 PM   Specimen: BLOOD RIGHT FOREARM  Result Value Ref Range Status   Specimen Description   Final    BLOOD RIGHT FOREARM Performed at [facility], [address], [place], [place] [address]    Special Requests   Final    BOTTLES DRAWN AEROBIC AND ANAEROBIC Blood Culture adequate volume Performed at [facility], [address], [place], [place] [address]    Culture   Final    NO GROWTH 5 DAYS Performed at [facility], [place] [address] [place], [place] [address]    Report Status 09/08/2022 FINAL  Final  Blood culture (routine x 2)     Status: None   Collection Time: 09/03/22  2:47 PM   Specimen: BLOOD  Result Value Ref Range Status   Specimen Description   Final    BLOOD LEFT ANTECUBITAL Performed at [facility], [place],  [place] [address]    Special Requests   Final    BOTTLES DRAWN AEROBIC AND ANAEROBIC Blood Culture adequate volume Performed at [facility], [address] [place], [place] [address]    Culture   Final    NO GROWTH 5 DAYS Performed at [facility], [place] [address] [place], [place] [address]    Report Status 09/08/2022 FINAL  Final  MRSA Next Gen by PCR, Nasal     Status: None   Collection Time: 09/03/22  3:14 PM   Specimen: Nasal Mucosa; Nasal Swab  Result Value Ref Range Status   MRSA by PCR Next Gen NOT DETECTED NOT DETECTED Final    Comment: (NOTE) The GeneXpert MRSA Assay (FDA approved for NASAL specimens only), is one component of a  comprehensive MRSA colonization surveillance program. It is not intended to diagnose MRSA infection nor to guide or monitor treatment for MRSA infections. Test performance is not FDA approved in patients less than 79 years old. Performed at [facility], [place] [address] [place],  [address]    Labs: CBC: Recent Labs  Lab 09/03/22 1200 09/04/22 0406 09/05/22 0503 09/06/22 0438  WBC 25.6* 18.4* 12.0* 9.6  NEUTROABS 22.5* 16.9* 8.9* 6.9  HGB 14.0 12.3* 10.6* 11.0*  HCT 40.8 38.0* 33.3* 33.5*  MCV 86.1 90.3 91.2 88.2  PLT 291 246 218 [id number]   Basic Metabolic Panel: Recent Labs  Lab 09/03/22 1200 09/04/22 0406 09/05/22 0503 09/06/22 0438  NA 135 134* 135 135  K 4.0 3.8 3.5 3.4*  CL 99 106 112* 103  CO2 19* 19* 18* 21*  GLUCOSE 121* 139* 91 87  BUN 23 14 14 12  $ CREATININE 1.27* 0.75 0.77 0.76  CALCIUM 9.1 8.2* 7.7* 8.1*  MG  --   --  2.3 2.2  PHOS  --   --  1.7* 2.6   Liver Function Tests: Recent Labs  Lab 09/03/22 1200 09/04/22 0406 09/05/22 0503 09/06/22 0438  AST 24 23 19 19  $ ALT 24 21 18 19  $ ALKPHOS 49 43 41 41  BILITOT 0.7 0.5 0.5 0.7  PROT 6.7 6.5 5.6* 5.9*  ALBUMIN 4.1 3.3* 3.0* 3.5   CBG: No results for input(s): "GLUCAP" in the last 168 hours.  Discharge time spent: greater than 30 minutes.  Signed: Raiford Noble, DO [facility] 09/08/2022

## 2022-09-08 NOTE — Assessment & Plan Note (Signed)
-  Secondary to previous compression fracture, status post kyphoplasty -He is on oxycodone as needed for pain, he takes about 5-6 times a day -he previously tried OxyContin and fentanyl patch but did not tolerate well due to confusion and hallucination

## 2022-09-08 NOTE — Progress Notes (Unsigned)
[name] Holder   [phone] [phone]   Clinic Follow up Note   Patient Care Team: [name], MD as PCP - General (Internal Medicine) Charlton Haws, [facility] as Pharmacist (Pharmacist) [place] Comment, [place], [place] as Referring Physician (Optometry)  Date of Service:  09/09/2022  CHIEF COMPLAINT: f/u of  Prostates Cancer  CURRENT THERAPY:   Zytiga 1000 mg daily with prednisone at 5 mg daily started in 2018.   Eligard 22.5 mg every 3 months.     Prolia  Injection 60 mg every 6 months.  ASSESSMENT:  [name] Holder is a 67 y.o. male with   Prostate cancer ([place]) -stage IV with node metastasis, Guardant 360 (-) in 2022 -diagnosed in 2014 with lymphadenopathy, Gleason score 7 and PSA of 23.  -He developed castration resistant in 2018.  His PSA was up to 11.3 at that time.  -He is status post T10, T12, kyphoplasty (osteoprosis related) completed on January 22 of 2020.  -he has been on Zytiga 1000 mg daily with prednisone at 5 mg daily started in 2018, Eligard 22.5 mg every 3 months and Prolia 60 mg every 6 months.  -Tolerating treatment well. His PSA has been slowly trending up with latest 1.5 in Jan 2024, with doubling time more than 6 months.  Will continue current therapy. -He was recently hospitalized for pneumonia and sepsis, hospital course reviewed.  He is recovering slowly, he is on continuous nasal cannula oxygen now.  He will follow-up with pulmonary.  Low back pain -Secondary to previous compression fracture, status post kyphoplasty -He is on oxycodone as needed for pain, he takes about 5-6 times a day -he previously tried OxyContin and fentanyl patch but did not tolerate well due to confusion and hallucination  -We discussed additional pain medication, such as gabapentin, Lyrica or Cymbalta.  Benefit and potential side effects were discussed with patient and his wife, patient also has depression, so we decided to let him try Cymbalta, I will  start at a low-dose 20 mg daily    PLAN: -lab reviewed -Discuss Cymbalta for pain, I called in 20 mg daily for a month.  Will titrate dose if needed. 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 Pt states at night he aspirated and  had to be admitted. The pt had no fever ans had shortness of breath. Pt was discharge with oxygen on 2  Liters. Pt states he still have a cough, but not coughing up phlegm. Pt denied having swelling.  Pt is taking Calcium x2 daily and Vitamin D. Pt wife states he does not take pain medication often for his back.Pt takes it q4 hours about x6  a day. Pt wife sates some time the pt wont even say if he is in pain. Pt took log active medication and it didn't agree with him. Pt wife states he will take a Tylenol occasionally if he has an headache.   All other systems were reviewed with the patient and are negative.  MEDICAL HISTORY:  Past Medical History:   Diagnosis Date   Allergic rhinitis    At risk for sleep apnea    STOP-BANG SCORE = 5    (routed to pt's pcp 09-08-2018)   Bladder cancer ([place]) dx'd 2020   Chronic constipation    Chronic low back pain    Chronic pain    DOE (dyspnea on exertion)    History of kidney stones    History of recent pneumonia 08/10/2018   acute bronchopneumonia;  follow-up cxr 08-28-2018 in epic   History of vertebral compression fracture    T10 and T12 s/p kyphoplasty   Hx of colonic polyps    Hyperplasia of prostate with lower urinary tract symptoms (LUTS)    Hypertension    Mild persistent asthma    followed by pcp   Numbness in both legs    secondary to a fall, per pt has had work-up and couldn't find anything   Osteopenia    Prostate cancer metastatic to pelvis [facility]) urologist-- dr borden/  oncologist -- dr Alen Blew   dx 2014 advanced prostate cancer with pelvid adenopathy,  Stage T2c, Gleason 7;  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   Wears glasses     SURGICAL HISTORY: Past Surgical History:  Procedure Laterality Date   COLONOSCOPY     IR KYPHO EA ADDL LEVEL THORACIC OR LUMBAR  08/17/2018   IR KYPHO EA ADDL LEVEL THORACIC OR LUMBAR  08/26/2018   IR KYPHO LUMBAR INC FX REDUCE BONE BX UNI/BIL CANNULATION INC/IMAGING  08/26/2018   IR KYPHO THORACIC WITH BONE BIOPSY  08/17/2018   IR RADIOLOGIST EVAL & MGMT  08/11/2018   IR RADIOLOGIST EVAL & MGMT  08/24/2018   LYMPHADENECTOMY Left 01/05/2013   Procedure: ROBOTIC LYMPHADENECTOMY;  Surgeon: Dutch Gray, MD;  Location: [facility];  Service: Urology;  Laterality: Left;   PROSTATE BIOPSY     11/12 positive biopsies   ROTATOR CUFF REPAIR Left 2012   TRANSURETHRAL RESECTION OF BLADDER TUMOR N/A 09/15/2018   Procedure: TRANSURETHRAL RESECTION OF BLADDER TUMOR (TURBT)WITH CYSTOSCOPY/ POST OPERATIVE INSTILLATION OF GEMCITABINE;  Surgeon: Raynelle Bring, MD;  Location: [facility];  Service: Urology;  Laterality: N/A;  GENERAL ANESTHESIA WITH PARALYSIS   UMBILICAL HERNIA REPAIR   09-02-2001   dr t. price @[facility]$     I have reviewed the social history and family history with the patient and they are unchanged from previous note.  ALLERGIES:  is allergic to amlodipine and tetracycline.  MEDICATIONS:  Current Outpatient Medications  Medication Sig Dispense Refill   DULoxetine (CYMBALTA) 20 MG capsule Take 1 capsule (20 mg total) by mouth daily. 30 capsule 1   abiraterone acetate (ZYTIGA) 250 MG tablet Take 4 tablets (1,000 mg total) by mouth daily. 120 tablet 5   acetaminophen (TYLENOL) 325 MG tablet Take 325 mg by mouth daily as needed for fever.     albuterol (PROVENTIL) (  2.5 MG/3ML) 0.083% nebulizer solution Take 3 mLs (2.5 mg total) by nebulization every 6 (six) hours as needed for wheezing or shortness of breath. 360 mL 11   albuterol (VENTOLIN HFA) 108 (90 Base) MCG/ACT inhaler Inhale 2 puffs into the lungs every 6 (six) hours as needed for wheezing or shortness of breath. 18 g 3   azelastine (ASTELIN) 0.1 % nasal spray Place 2 puffs into the nose in the morning and at bedtime. 90 mL 1   CALCIUM-VITAMIN D PO Take 1 tablet by mouth 2 (two) times daily.      carvedilol (COREG) 3.125 MG tablet TAKE 1 TABLET 2 TIMES A DAY WITH BREAKFAST & SUPPER 180 tablet 1   denosumab (PROLIA) 60 MG/ML SOSY injection Inject 60 mg into the skin every 6 (six) months. (Patient not taking: Reported on 09/07/2022)     diazepam (VALIUM) 2 MG tablet Take 1 tablet (2 mg total) by mouth every 8 (eight) hours as needed for anxiety. 90 tablet 2   Famotidine (PEPCID AC PO) Take 1 tablet by mouth at bedtime.     fluticasone (FLONASE) 50 MCG/ACT nasal spray Place 2 sprays into both nostrils daily. 48 g 1   Fluticasone-Umeclidin-Vilant (TRELEGY ELLIPTA) 100-62.5-25 MCG/ACT AEPB Inhale 1 puff into the lungs daily. 120 each 3   furosemide (LASIX) 20 MG tablet TAKE ONE TABLET TWICE DAILY 180 tablet 0   guaiFENesin (MUCINEX) 600 MG 12 hr tablet Take 1 tablet (600 mg total) by mouth 2 (two) times daily for 5  days. 10 tablet 0   leuprolide (LUPRON) 11.25 MG injection Inject 11.25 mg into the muscle every 3 (three) months.     lubiprostone (AMITIZA) 24 MCG capsule Take 1 capsule (24 mcg total) by mouth 2 (two) times daily with a meal. 180 capsule 1   meclizine (ANTIVERT) 25 MG tablet Take 1 tablet (25 mg total) by mouth 3 (three) times daily as needed for dizziness. 270 tablet 0   Multiple Vitamins-Minerals (CENTRUM SILVER 50+MEN PO) Take 1 tablet by mouth daily.     NARCAN 4 MG/0.1ML LIQD nasal spray kit Place 0.4 mg into the nose once as needed (overdose).      olmesartan (BENICAR) 40 MG tablet TAKE ONE TABLET ONCE DAILY 90 tablet 1   ondansetron (ZOFRAN) 4 MG tablet Take 1 tablet (4 mg total) by mouth every 6 (six) hours as needed for nausea. 20 tablet 0   oxyCODONE (OXY IR/ROXICODONE) 5 MG immediate release tablet Take 1 tablet (5 mg total) by mouth every 4 (four) hours as needed for severe pain. 180 tablet 0   polyethylene glycol powder (GLYCOLAX/MIRALAX) 17 GM/SCOOP powder Take 17 g (1 capful) by mouth daily as directed. 510 g 6   predniSONE (DELTASONE) 5 MG tablet TAKE 1 TABLET 2 TIMES A DAY 90 tablet 3   sodium bicarbonate 650 MG tablet Take 1 tablet (650 mg total) by mouth 2 (two) times daily for 3 days. 6 tablet 0   sodium chloride 1 g tablet Take 1 g by mouth 2 (two) times daily with a meal.     solifenacin (VESICARE) 5 MG tablet Take 5 mg by mouth daily.     Specialty Vitamins Products (VISTA ADVANCED CAROTENOID) CAPS Take 1 capsule by mouth every evening.     vitamin C (ASCORBIC ACID) 500 MG tablet Take 500 mg by mouth every evening.     vitamin E 180 MG (400 UNITS) capsule Take 400 Units by mouth every evening.  No current facility-administered medications for this visit.    PHYSICAL EXAMINATION: ECOG PERFORMANCE STATUS: 3 - Symptomatic, >50% confined to bed  Vitals:   09/09/22 1037  BP: 99/61  Pulse: 85  Resp: 18  Temp: 97.8 F (36.6 C)  SpO2: 100%   Wt Readings from Last  3 Encounters:  09/03/22 228 lb (103.4 kg)  09/02/22 228 lb (103.4 kg)  08/11/22 226 lb 3.2 oz (102.6 kg)     GENERAL:alert, no distress and comfortable SKIN: skin color normal, no rashes or significant lesions EYES: normal, Conjunctiva are pink and non-injected, sclera clear  NEURO: alert & oriented x 3 with fluent speech LUNGS: (-)clear to auscultation and percussion with normal breathing effort HEART: (-)regular rate & rhythm and no murmurs and no lower extremity edema ABDOMEN:(-) abdomen soft, non-tender and normal bowel sounds  LABORATORY DATA:  I have reviewed the data as listed    Latest Ref Rng & Units 09/06/2022    4:38 AM 09/05/2022    5:03 AM 09/04/2022    4:06 AM  CBC  WBC 4.0 - 10.5 K/uL 9.6  12.0  18.4   Hemoglobin 13.0 - 17.0 g/dL 11.0  10.6  12.3   Hematocrit 39.0 - 52.0 % 33.5  33.3  38.0   Platelets 150 - 400 K/uL 218  218  246         Latest Ref Rng & Units 09/06/2022    4:38 AM 09/05/2022    5:03 AM 09/04/2022    4:06 AM  CMP  Glucose 70 - 99 mg/dL 87  91  139   BUN 8 - 23 mg/dL 12  14  14   $ Creatinine 0.61 - 1.24 mg/dL 0.76  0.77  0.75   Sodium 135 - 145 mmol/L 135  135  134   Potassium 3.5 - 5.1 mmol/L 3.4  3.5  3.8   Chloride 98 - 111 mmol/L 103  112  106   CO2 22 - 32 mmol/L 21  18  19   $ Calcium 8.9 - 10.3 mg/dL 8.1  7.7  8.2   Total Protein 6.5 - 8.1 g/dL 5.9  5.6  6.5   Total Bilirubin 0.3 - 1.2 mg/dL 0.7  0.5  0.5   Alkaline Phos 38 - 126 U/L 41  41  43   AST 15 - 41 U/L 19  19  23   $ ALT 0 - 44 U/L 19  18  21       $ RADIOGRAPHIC STUDIES: I have personally reviewed the radiological images as listed and agreed with the findings in the report. No results found.    No orders of the defined types were placed in this encounter.  All questions were answered. The patient knows to call the clinic with any problems, questions or concerns. No barriers to learning was detected. The total time spent in the appointment was 40 minutes.     Truitt Merle,  MD 09/09/2022   Felicity Coyer, CMA, am acting as scribe for Truitt Merle, MD.   I have reviewed the above documentation for accuracy and completeness, and I agree with the above.

## 2022-09-09 ENCOUNTER — Other Ambulatory Visit: Payer: Self-pay

## 2022-09-09 ENCOUNTER — Inpatient Hospital Stay: Payer: Medicare Other | Attending: Hematology | Admitting: Hematology

## 2022-09-09 ENCOUNTER — Encounter: Payer: Self-pay | Admitting: Hematology

## 2022-09-09 ENCOUNTER — Other Ambulatory Visit (HOSPITAL_COMMUNITY): Payer: Self-pay

## 2022-09-09 VITALS — BP 99/61 | HR 85 | Temp 97.8°F | Resp 18 | Ht 67.0 in

## 2022-09-09 DIAGNOSIS — Z8546 Personal history of malignant neoplasm of prostate: Secondary | ICD-10-CM

## 2022-09-09 DIAGNOSIS — C7952 Secondary malignant neoplasm of bone marrow: Secondary | ICD-10-CM | POA: Diagnosis not present

## 2022-09-09 DIAGNOSIS — M545 Low back pain, unspecified: Secondary | ICD-10-CM

## 2022-09-09 DIAGNOSIS — G8929 Other chronic pain: Secondary | ICD-10-CM

## 2022-09-09 DIAGNOSIS — C61 Malignant neoplasm of prostate: Secondary | ICD-10-CM | POA: Diagnosis not present

## 2022-09-09 DIAGNOSIS — C7951 Secondary malignant neoplasm of bone: Secondary | ICD-10-CM

## 2022-09-09 MED ORDER — DULOXETINE HCL 20 MG PO CPEP: 20.0000 mg | ORAL_CAPSULE | Freq: Every day | ORAL | 1 refills | Status: DC

## 2022-09-09 MED ORDER — ABIRATERONE ACETATE 250 MG PO TABS: 1000.0000 mg | ORAL_TABLET | Freq: Every day | ORAL | 5 refills | Status: DC

## 2022-09-09 MED ORDER — OXYCODONE HCL 5 MG PO TABS
5.0000 mg | ORAL_TABLET | ORAL | 0 refills | Status: DC | PRN
  Filled 2022-09-09: qty 180, 30d supply, fill #0

## 2022-09-10 ENCOUNTER — Other Ambulatory Visit: Payer: Self-pay | Admitting: Internal Medicine

## 2022-09-10 ENCOUNTER — Telehealth: Payer: Self-pay | Admitting: Internal Medicine

## 2022-09-10 DIAGNOSIS — J1282 Pneumonia due to coronavirus disease 2019: Secondary | ICD-10-CM | POA: Insufficient documentation

## 2022-09-10 DIAGNOSIS — U071 COVID-19: Secondary | ICD-10-CM

## 2022-09-10 MED ORDER — NIRMATRELVIR/RITONAVIR (PAXLOVID)TABLET: 3.0000 | ORAL_TABLET | Freq: Two times a day (BID) | ORAL | 0 refills | Status: AC

## 2022-09-10 NOTE — Telephone Encounter (Signed)
Patient called and said he tested positive for Covid on 09/09/22. He would like to know if Dr. [name] can prescribe Paxlovid for him. His pharmacy is [facility], [place] callback is [phone]

## 2022-09-14 ENCOUNTER — Ambulatory Visit: Payer: Medicare Other | Admitting: Nurse Practitioner

## 2022-09-18 ENCOUNTER — Telehealth (INDEPENDENT_AMBULATORY_CARE_PROVIDER_SITE_OTHER): Payer: Medicare Other | Admitting: Nurse Practitioner

## 2022-09-18 DIAGNOSIS — U071 COVID-19: Secondary | ICD-10-CM | POA: Insufficient documentation

## 2022-09-18 MED ORDER — NIRMATRELVIR/RITONAVIR (PAXLOVID)TABLET: 3.0000 | ORAL_TABLET | Freq: Two times a day (BID) | ORAL | 0 refills | Status: AC

## 2022-09-18 NOTE — Progress Notes (Signed)
   Established Patient Office Visit  An audio/visual tele-health visit was completed today for this patient. I connected with  Glendon Axe on 09/18/22 utilizing audio/visual technology and verified that I am speaking with the correct person using two identifiers.  Patient's wife Juliann Pulse is also present to assist.  The patient was located at their home, and I was located at the office of [facility] during the encounter. I discussed the limitations of evaluation and management by telemedicine. The patient expressed understanding and agreed to proceed.     Subjective   Patient ID: [name] Holder, male    DOB: 07/20/1956  Age: 67 y.o. MRN: [id number]  Chief Complaint  Patient presents with   Covid Positive    Tested positive for COVID on 09/09/2021, took a course of Paxlovid.  Improved and tested negative after completing course.  Started experiencing scratchy throat and cough around 5 days ago.  Tested for COVID-19 today and was positive after previous negative result.  No shortness of breath.  Has been vaccinated x 4.  Has history of asthma COPD overlap history of malignancy as well.  Hospitalized within the last month with pneumonia.   Review of Systems  Constitutional:  Positive for chills (mild). Negative for malaise/fatigue.  HENT:  Positive for sore throat.   Respiratory:  Positive for cough and sputum production. Negative for shortness of breath.   Gastrointestinal:  Negative for diarrhea, nausea and vomiting.  Musculoskeletal:  Negative for myalgias.  Neurological:  Positive for dizziness. Negative for headaches.      Objective:     There were no vitals taken for this visit.   Physical Exam Comprehensive physical exam not completed today as office visit was conducted remotely.  Patient appears well over video, no signs of acute respiratory distress noted.  Patient was alert and oriented, and appeared to have appropriate judgment.   No results  found for any visits on 09/18/22.    The 10-year ASCVD risk score (Arnett DK, et al., 2019) is: 12.5%    Assessment & Plan:   Problem List Items Addressed This Visit       Other   COVID-19 - Primary    Acute, patient recently treated for COVID-19 infection, reported that he improved after treatment and then symptoms came back.  Retested and he was positive again.  Due to patient's significant comorbidities and immunosuppressed state will plan on treating again with course of Paxlovid.  We did discuss possible drug to drug interactions with Paxlovid and current medications including diazepam, fluticasone, and oxycodone.  Recommended not taking any diazepam while on Paxlovid considering cutting oxycodone in half while on Paxlovid.  Patient's wife is agreeable to monitoring respiratory status and does have Narcan if any concerns for possible overdose are seen. She reports understanding. Patient encouraged to call 911 if he experiences severe symptoms. They report understanding.       Relevant Medications   nirmatrelvir/ritonavir (PAXLOVID) 20 x 150 MG & 10 x 100MG TABS    Return if symptoms worsen or fail to improve.    Ailene Ards, NP

## 2022-09-18 NOTE — Assessment & Plan Note (Signed)
Acute, patient recently treated for COVID-19 infection, reported that he improved after treatment and then symptoms came back.  Retested and he was positive again.  Due to patient's significant comorbidities and immunosuppressed state will plan on treating again with course of Paxlovid.  We did discuss possible drug to drug interactions with Paxlovid and current medications including diazepam, fluticasone, and oxycodone.  Recommended not taking any diazepam while on Paxlovid considering cutting oxycodone in half while on Paxlovid.  Patient's wife is agreeable to monitoring respiratory status and does have Narcan if any concerns for possible overdose are seen. She reports understanding. Patient encouraged to call 911 if he experiences severe symptoms. They report understanding.

## 2022-09-24 ENCOUNTER — Ambulatory Visit: Payer: Medicare Other | Admitting: Nurse Practitioner

## 2022-09-30 ENCOUNTER — Ambulatory Visit: Payer: Medicare Other | Admitting: Nurse Practitioner

## 2022-10-05 ENCOUNTER — Other Ambulatory Visit: Payer: Self-pay

## 2022-10-05 ENCOUNTER — Other Ambulatory Visit: Payer: Self-pay | Admitting: Hematology

## 2022-10-05 DIAGNOSIS — E669 Obesity, unspecified: Secondary | ICD-10-CM | POA: Diagnosis not present

## 2022-10-05 DIAGNOSIS — K5903 Drug induced constipation: Secondary | ICD-10-CM | POA: Diagnosis not present

## 2022-10-05 DIAGNOSIS — C61 Malignant neoplasm of prostate: Secondary | ICD-10-CM

## 2022-10-05 DIAGNOSIS — J4489 Other specified chronic obstructive pulmonary disease: Secondary | ICD-10-CM | POA: Diagnosis not present

## 2022-10-05 DIAGNOSIS — J189 Pneumonia, unspecified organism: Secondary | ICD-10-CM | POA: Diagnosis not present

## 2022-10-08 ENCOUNTER — Other Ambulatory Visit: Payer: Self-pay | Admitting: Internal Medicine

## 2022-10-08 DIAGNOSIS — I10 Essential (primary) hypertension: Secondary | ICD-10-CM

## 2022-10-10 ENCOUNTER — Other Ambulatory Visit: Payer: Self-pay

## 2022-10-10 DIAGNOSIS — Z8546 Personal history of malignant neoplasm of prostate: Secondary | ICD-10-CM

## 2022-10-10 MED ORDER — PREDNISONE 5 MG PO TABS: 5.0000 mg | ORAL_TABLET | Freq: Two times a day (BID) | ORAL | 3 refills | Status: DC

## 2022-10-14 ENCOUNTER — Telehealth: Payer: Self-pay

## 2022-10-14 ENCOUNTER — Other Ambulatory Visit: Payer: Self-pay

## 2022-10-14 NOTE — Telephone Encounter (Signed)
Patient called wanting to get a refill on his Oxycodone 5mg . Stated he wanted to get it sent to [facility]

## 2022-10-14 NOTE — Progress Notes (Signed)
Open in Error.

## 2022-10-15 ENCOUNTER — Other Ambulatory Visit (HOSPITAL_COMMUNITY): Payer: Self-pay

## 2022-10-15 ENCOUNTER — Other Ambulatory Visit: Payer: Self-pay | Admitting: Hematology

## 2022-10-15 DIAGNOSIS — C7951 Secondary malignant neoplasm of bone: Secondary | ICD-10-CM

## 2022-10-15 MED ORDER — OXYCODONE HCL 5 MG PO TABS
5.0000 mg | ORAL_TABLET | ORAL | 0 refills | Status: DC | PRN
  Filled 2022-10-15: qty 180, 30d supply, fill #0

## 2022-10-16 ENCOUNTER — Other Ambulatory Visit (HOSPITAL_COMMUNITY): Payer: Self-pay

## 2022-10-31 ENCOUNTER — Other Ambulatory Visit: Payer: Self-pay | Admitting: Internal Medicine

## 2022-10-31 DIAGNOSIS — I10 Essential (primary) hypertension: Secondary | ICD-10-CM

## 2022-11-05 DIAGNOSIS — J4489 Other specified chronic obstructive pulmonary disease: Secondary | ICD-10-CM | POA: Diagnosis not present

## 2022-11-05 DIAGNOSIS — E669 Obesity, unspecified: Secondary | ICD-10-CM | POA: Diagnosis not present

## 2022-11-05 DIAGNOSIS — K5903 Drug induced constipation: Secondary | ICD-10-CM | POA: Diagnosis not present

## 2022-11-05 DIAGNOSIS — J189 Pneumonia, unspecified organism: Secondary | ICD-10-CM | POA: Diagnosis not present

## 2022-11-09 ENCOUNTER — Other Ambulatory Visit: Payer: Self-pay | Admitting: Hematology

## 2022-11-12 ENCOUNTER — Encounter: Payer: Self-pay | Admitting: Hematology

## 2022-11-12 ENCOUNTER — Inpatient Hospital Stay: Payer: Medicare Other

## 2022-11-12 ENCOUNTER — Inpatient Hospital Stay: Payer: Medicare Other | Attending: Hematology | Admitting: Hematology

## 2022-11-12 VITALS — BP 100/70 | HR 81 | Temp 98.4°F | Resp 18 | Ht 67.0 in | Wt 211.2 lb

## 2022-11-12 DIAGNOSIS — C7952 Secondary malignant neoplasm of bone marrow: Secondary | ICD-10-CM

## 2022-11-12 DIAGNOSIS — Z5111 Encounter for antineoplastic chemotherapy: Secondary | ICD-10-CM | POA: Diagnosis not present

## 2022-11-12 DIAGNOSIS — C7951 Secondary malignant neoplasm of bone: Secondary | ICD-10-CM | POA: Insufficient documentation

## 2022-11-12 DIAGNOSIS — C61 Malignant neoplasm of prostate: Secondary | ICD-10-CM

## 2022-11-12 DIAGNOSIS — Z79899 Other long term (current) drug therapy: Secondary | ICD-10-CM | POA: Insufficient documentation

## 2022-11-12 LAB — CMP (CANCER CENTER ONLY)
ALT: 15 U/L (ref 0–44)
AST: 12 U/L — ABNORMAL LOW (ref 15–41)
Albumin: 4.1 g/dL (ref 3.5–5.0)
Alkaline Phosphatase: 46 U/L (ref 38–126)
Anion gap: 7 (ref 5–15)
BUN: 17 mg/dL (ref 8–23)
CO2: 24 mmol/L (ref 22–32)
Calcium: 9.5 mg/dL (ref 8.9–10.3)
Chloride: 102 mmol/L (ref 98–111)
Creatinine: 0.8 mg/dL (ref 0.61–1.24)
GFR, Estimated: >60 mL/min (ref >60)
Glucose, Bld: 129 mg/dL — ABNORMAL HIGH (ref 70–99)
Potassium: 3.9 mmol/L (ref 3.5–5.1)
Sodium: 133 mmol/L — ABNORMAL LOW (ref 135–145)
Total Bilirubin: 0.5 mg/dL (ref 0.3–1.2)
Total Protein: 6.6 g/dL (ref 6.5–8.1)

## 2022-11-12 LAB — CBC WITH DIFFERENTIAL (CANCER CENTER ONLY)
Abs Immature Granulocytes: 0.04 10*3/uL (ref 0.00–0.07)
Basophils Absolute: 0.1 10*3/uL (ref 0.0–0.1)
Basophils Relative: 1 %
Eosinophils Absolute: 0.1 10*3/uL (ref 0.0–0.5)
Eosinophils Relative: 1 %
HCT: 35.4 % — ABNORMAL LOW (ref 39.0–52.0)
Hemoglobin: 12.4 g/dL — ABNORMAL LOW (ref 13.0–17.0)
Immature Granulocytes: 0 %
Lymphocytes Relative: 18 %
Lymphs Abs: 1.7 10*3/uL (ref 0.7–4.0)
MCH: 30.2 pg (ref 26.0–34.0)
MCHC: 35 g/dL (ref 30.0–36.0)
MCV: 86.3 fL (ref 80.0–100.0)
Monocytes Absolute: 0.7 10*3/uL (ref 0.1–1.0)
Monocytes Relative: 8 %
Neutro Abs: 7.2 10*3/uL (ref 1.7–7.7)
Neutrophils Relative %: 72 %
Platelet Count: 304 10*3/uL (ref 150–400)
RBC: 4.1 MIL/uL — ABNORMAL LOW (ref 4.22–5.81)
RDW: 13.9 % (ref 11.5–15.5)
WBC Count: 9.9 10*3/uL (ref 4.0–10.5)
nRBC: 0 % (ref 0.0–0.2)

## 2022-11-12 LAB — PSA: PSA: 1.9

## 2022-11-12 MED ORDER — LEUPROLIDE ACETATE (3 MONTH) 22.5 MG ~~LOC~~ KIT
22.5000 mg | PACK | Freq: Once | SUBCUTANEOUS | Status: AC
  Administered 2022-11-12: 22.5 mg via SUBCUTANEOUS
  Filled 2022-11-12: qty 22.5

## 2022-11-12 NOTE — Assessment & Plan Note (Addendum)
-  stage IV with node metastasis, Guardant 360 (-) in 2022 -diagnosed in 2014 with lymphadenopathy, Gleason score 7 and PSA of 23.  -He developed castration resistant in 2018.  His PSA was up to 11.3 at that time.  -He is status post T10, T12, kyphoplasty (osteoprosis related) completed on January 22 of 2020.  -he has been on Zytiga 1000 mg daily with prednisone at 5 mg daily started in 2018, Eligard 22.5 mg every 3 months and Prolia 60 mg every 6 months.  -Tolerating treatment well. His PSA has been slowly trending up with latest 1.5 in Jan 2024, with doubling time more than 6 months.  Will continue current therapy.

## 2022-11-12 NOTE — Progress Notes (Signed)
[facility]   [phone] [phone]   Clinic Follow up Note   Patient Care Team: Etta Grandchild, MD as PCP - General (Internal Medicine) Kathyrn Sheriff, [facility] as Pharmacist (Pharmacist) Conley Rolls, [place], [place] as Referring Physician (Optometry) [name], MD as Attending Physician (Hematology and Oncology)  Date of Service:  11/12/2022  CHIEF COMPLAINT: f/u of  Prostate Cancer   CURRENT THERAPY:   Zytiga 1000 mg daily with prednisone at 5 mg daily started in 2018.   Eligard 22.5 mg every 3 months.     Prolia  Injection 60 mg every 6 months.  ASSESSMENT:  [name] Holder is a 67 y.o. male with   Prostate cancer (HCC) -stage IV with node metastasis, Guardant 360 (-) in 2022 -diagnosed in 2014 with lymphadenopathy, Gleason score 7 and PSA of 23.  -He developed castration resistant in 2018.  His PSA was up to 11.3 at that time.  -He is status post T10, T12, kyphoplasty (osteoprosis related) completed on January 22 of 2020.  -he has been on Zytiga 1000 mg daily with prednisone at 5 mg daily started in 2018, Eligard 22.5 mg every 3 months and Prolia 60 mg every 6 months.  -Tolerating treatment well. His PSA has been slowly trending up with latest 1.5 in Jan 2024, with doubling time more than 6 months.  Will continue current therapy.  Cancer related pain -He is taking oxycodone 2.5 to 5 mg for 5 times a day, his pain is controlled. -We discussed long-acting MS Contin, since his pain is controlled, will hold it for now. -I added the Cymbalta last time, it is helping him will continue.    PLAN: -lab reviewed- mild anemia - Continue Cymbalta,pt is tolerating well. -discuss Morphine long acting, will hold it for now -PAS pending -continue same  Supportive Therapy treatment Eligard 22.5 mg, Zytiga 1000 mg with Prednisone 5 mg, Prolia inj 60 mg -lab/ and f/u in 3 months    SUMMARY OF ONCOLOGIC HISTORY: Oncology History Overview Note   Cancer  Staging  Secondary malignant neoplasm of bone and bone marrow (HCC) Staging form: Bone - Appendicular Skeleton, Trunk, Skull, and Facial Bones, AJCC 8th Edition - Clinical: Stage Unknown (cTX, cNX, pM1) - Signed by Benjiman Core, MD on 12/10/2020     Secondary malignant neoplasm of bone and bone marrow  08/06/2018 Initial Diagnosis   Secondary malignant neoplasm of bone and bone marrow (HCC)   12/10/2020 Cancer Staging   Staging form: Bone - Appendicular Skeleton, Trunk, Skull, and Facial Bones, AJCC 8th Edition - Clinical: Stage Unknown (cTX, cNX, pM1) - Signed by Benjiman Core, MD on 12/10/2020   Prostate cancer  09/16/2021 Initial Diagnosis   Prostate cancer [facility])      INTERVAL HISTORY:  [name] Holder is here for a follow up of  Prostate Cancer  He was last seen by me on 09/09/2022. He presents to the clinic accompanied by wife. Pt state that him and his wife had Covid, he recovered very well. Pt has some pain and he take oxycodone for the pain and he rates his pain today at a 2. Pt is tolerating Zytiga. Pt states that his pain is under controlled. Pt wife said she is notice a change for the good ,since her husband has been on Cymbalta. Pt is being active by walking.     All other systems were reviewed with the patient and are negative.  MEDICAL HISTORY:  Past Medical  History:  Diagnosis Date   Allergic rhinitis    At risk for sleep apnea    STOP-BANG SCORE = 5    (routed to pt's pcp 09-08-2018)   Bladder cancer dx'd 2020   Chronic constipation    Chronic low back pain    Chronic pain    DOE (dyspnea on exertion)    History of kidney stones    History of recent pneumonia 08/10/2018   acute bronchopneumonia;  follow-up cxr 08-28-2018 in epic   History of vertebral compression fracture    T10 and T12 s/p kyphoplasty   Hx of colonic polyps    Hyperplasia of prostate with lower urinary tract symptoms (LUTS)    Hypertension    Mild persistent asthma    followed by  pcp   Numbness in both legs    secondary to a fall, per pt has had work-up and couldn't find anything   Osteopenia    Prostate cancer metastatic to pelvis urologist-- dr borden/  oncologist -- dr Clelia Croft   dx 2014 advanced prostate cancer with pelvid adenopathy,  Stage T2c, Gleason 7;  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   Wears glasses     SURGICAL HISTORY: Past Surgical History:  Procedure Laterality Date   COLONOSCOPY     IR KYPHO EA ADDL LEVEL THORACIC OR LUMBAR  08/17/2018   IR KYPHO EA ADDL LEVEL THORACIC OR LUMBAR  08/26/2018   IR KYPHO LUMBAR INC FX REDUCE BONE BX UNI/BIL CANNULATION INC/IMAGING  08/26/2018   IR KYPHO THORACIC WITH BONE BIOPSY  08/17/2018   IR RADIOLOGIST EVAL & MGMT  08/11/2018   IR RADIOLOGIST EVAL & MGMT  08/24/2018   LYMPHADENECTOMY Left 01/05/2013   Procedure: ROBOTIC LYMPHADENECTOMY;  Surgeon: Crecencio Mc, MD;  Location: [facility];  Service: Urology;  Laterality: Left;   PROSTATE BIOPSY     11/12 positive biopsies   ROTATOR CUFF REPAIR Left 2012   TRANSURETHRAL RESECTION OF BLADDER TUMOR N/A 09/15/2018   Procedure: TRANSURETHRAL RESECTION OF BLADDER TUMOR (TURBT)WITH CYSTOSCOPY/ POST OPERATIVE INSTILLATION OF GEMCITABINE;  Surgeon: Heloise Purpura, MD;  Location: [facility];  Service: Urology;  Laterality: N/A;  GENERAL ANESTHESIA WITH PARALYSIS   UMBILICAL HERNIA REPAIR  09-02-2001   dr t. price @[facility]     I have reviewed the social history and family history with the patient and they are unchanged from previous note.  ALLERGIES:  is allergic to amlodipine and tetracycline.  MEDICATIONS:  Current Outpatient Medications  Medication Sig Dispense Refill   abiraterone acetate (ZYTIGA) 250 MG tablet Take 4 tablets (1,000 mg total) by mouth daily. 120 tablet 5   acetaminophen (TYLENOL) 325 MG tablet Take 325 mg by mouth daily as needed for fever.     albuterol (PROVENTIL) (2.5 MG/3ML) 0.083% nebulizer solution Take 3 mLs (2.5 mg total) by nebulization every 6 (six) hours as needed  for wheezing or shortness of breath. 360 mL 11   albuterol (VENTOLIN HFA) 108 (90 Base) MCG/ACT inhaler Inhale 2 puffs into the lungs every 6 (six) hours as needed for wheezing or shortness of breath. 18 g 3   azelastine (ASTELIN) 0.1 % nasal spray Place 2 puffs into the nose in the morning and at bedtime. 90 mL 1   CALCIUM-VITAMIN D PO Take 1 tablet by mouth 2 (two) times daily.      carvedilol (COREG) 3.125 MG tablet TAKE 1 TABLET 2 TIMES A DAY WITH BREAKFAST & SUPPER 180 tablet 0   denosumab (PROLIA) 60 MG/ML SOSY injection Inject  60 mg into the skin every 6 (six) months.     DULoxetine (CYMBALTA) 20 MG capsule TAKE ONE CAPSULE DAILY 30 capsule 1   Famotidine (PEPCID AC PO) Take 1 tablet by mouth at bedtime.     fluticasone (FLONASE) 50 MCG/ACT nasal spray Place 2 sprays into both nostrils daily. 48 g 1   Fluticasone-Umeclidin-Vilant (TRELEGY ELLIPTA) 100-62.5-25 MCG/ACT AEPB Inhale 1 puff into the lungs daily. 120 each 3   furosemide (LASIX) 20 MG tablet TAKE ONE TABLET TWICE DAILY 180 tablet 0   leuprolide (LUPRON) 11.25 MG injection Inject 11.25 mg into the muscle every 3 (three) months.     lubiprostone (AMITIZA) 24 MCG capsule Take 1 capsule (24 mcg total) by mouth 2 (two) times daily with a meal. 180 capsule 1   meclizine (ANTIVERT) 25 MG tablet Take 1 tablet (25 mg total) by mouth 3 (three) times daily as needed for dizziness. 270 tablet 0   Multiple Vitamins-Minerals (CENTRUM SILVER 50+MEN PO) Take 1 tablet by mouth daily.     NARCAN 4 MG/0.1ML LIQD nasal spray kit Place 0.4 mg into the nose once as needed (overdose).      olmesartan (BENICAR) 40 MG tablet TAKE ONE TABLET ONCE DAILY 90 tablet 1   ondansetron (ZOFRAN) 4 MG tablet Take 1 tablet (4 mg total) by mouth every 6 (six) hours as needed for nausea. 20 tablet 0   oxyCODONE (OXY IR/ROXICODONE) 5 MG immediate release tablet Take 1 tablet (5 mg total) by mouth every 4 (four) hours as needed for severe pain. 180 tablet 0    polyethylene glycol powder (GLYCOLAX/MIRALAX) 17 GM/SCOOP powder Take 17 g (1 capful) by mouth daily as directed. 510 g 6   predniSONE (DELTASONE) 5 MG tablet Take 1 tablet (5 mg total) by mouth 2 (two) times daily. 90 tablet 3   sodium chloride 1 g tablet Take 1 g by mouth 2 (two) times daily with a meal.     solifenacin (VESICARE) 5 MG tablet Take 5 mg by mouth daily.     Specialty Vitamins Products (VISTA ADVANCED CAROTENOID) CAPS Take 1 capsule by mouth every evening.     vitamin C (ASCORBIC ACID) 500 MG tablet Take 500 mg by mouth every evening.     vitamin E 180 MG (400 UNITS) capsule Take 400 Units by mouth every evening.     No current facility-administered medications for this visit.    PHYSICAL EXAMINATION: ECOG PERFORMANCE STATUS: 2 - Symptomatic, <50% confined to bed  Vitals:   11/12/22 1132  BP: 100/70  Pulse: 81  Resp: 18  Temp: 98.4 F (36.9 C)  SpO2: 100%   Wt Readings from Last 3 Encounters:  11/12/22 211 lb 3.2 oz (95.8 kg)  09/03/22 228 lb (103.4 kg)  09/02/22 228 lb (103.4 kg)    GENERAL:alert, no distress and comfortable SKIN: skin color, texture, turgor are normal, no rashes or significant lesions EYES: normal, Conjunctiva are pink and non-injected, sclera clear  LUNGS:(-)  clear to auscultation and percussion with normal breathing effort HEART: (-) regular rate & rhythm and no murmurs and (-) no lower extremity edema ABDOMEN:abdomen soft, non-tender and normal bowel sounds Musculoskeletal:no cyanosis of digits and no clubbing  NEURO: alert & oriented x 3 with fluent speech, no focal motor/sensory deficits  LABORATORY DATA:  I have reviewed the data as listed    Latest Ref Rng & Units 11/12/2022   11:07 AM 09/06/2022    4:38 AM 09/05/2022    5:03  AM  CBC  WBC 4.0 - 10.5 K/uL 9.9  9.6  12.0   Hemoglobin 13.0 - 17.0 g/dL 13.2  44.0  10.2   Hematocrit 39.0 - 52.0 % 35.4  33.5  33.3   Platelets 150 - 400 K/uL 304  218  218         Latest Ref Rng &  Units 11/12/2022   11:07 AM 09/06/2022    4:38 AM 09/05/2022    5:03 AM  CMP  Glucose 70 - 99 mg/dL 725  87  91   BUN 8 - 23 mg/dL Creatinine 0.61 - 1.24 mg/dL 3.66  4.40  3.47   Sodium 135 - 145 mmol/L 133  135  135   Potassium 3.5 - 5.1 mmol/L 3.9  3.4  3.5   Chloride 98 - 111 mmol/L 102  103  112   CO2 22 - 32 mmol/L Calcium 8.9 - 10.3 mg/dL 9.5  8.1  7.7   Total Protein 6.5 - 8.1 g/dL 6.6  5.9  5.6   Total Bilirubin 0.3 - 1.2 mg/dL 0.5  0.7  0.5   Alkaline Phos 38 - 126 U/L 46  41  41   AST 15 - 41 U/L ALT 0 - 44 U/L RADIOGRAPHIC STUDIES: I have personally reviewed the radiological images as listed and agreed with the findings in the report. No results found.    No orders of the defined types were placed in this encounter.  All questions were answered. The patient knows to call the clinic with any problems, questions or concerns. No barriers to learning was detected. The total time spent in the appointment was 25 minutes.     [name], MD 11/12/2022   Carolin Coy, CMA, am acting as scribe for [name], MD.   I have reviewed the above documentation for accuracy and completeness, and I agree with the above.

## 2022-11-14 LAB — PROSTATE-SPECIFIC AG, SERUM (LABCORP): Prostate Specific Ag, Serum: 1.9 ng/mL (ref 0.0–4.0)

## 2022-11-30 ENCOUNTER — Encounter: Payer: Self-pay | Admitting: Hematology

## 2022-11-30 ENCOUNTER — Other Ambulatory Visit (HOSPITAL_COMMUNITY): Payer: Self-pay

## 2022-11-30 ENCOUNTER — Other Ambulatory Visit: Payer: Self-pay | Admitting: Hematology

## 2022-11-30 DIAGNOSIS — C7951 Secondary malignant neoplasm of bone: Secondary | ICD-10-CM

## 2022-11-30 MED ORDER — OXYCODONE HCL 5 MG PO TABS
5.0000 mg | ORAL_TABLET | ORAL | 0 refills | Status: DC | PRN
Start: 2022-11-30 — End: 2023-01-18
  Filled 2022-11-30: qty 180, 30d supply, fill #0

## 2022-11-30 NOTE — Telephone Encounter (Signed)
Patient called in needing a refill for his Oxycodone 5mg  called into the [facility] pharmacy to be picked up tomorrow while he is in town.

## 2022-12-01 ENCOUNTER — Other Ambulatory Visit (HOSPITAL_COMMUNITY): Payer: Self-pay

## 2022-12-01 ENCOUNTER — Encounter: Payer: Self-pay | Admitting: Internal Medicine

## 2022-12-01 ENCOUNTER — Ambulatory Visit (INDEPENDENT_AMBULATORY_CARE_PROVIDER_SITE_OTHER): Payer: Medicare Other | Admitting: Internal Medicine

## 2022-12-01 VITALS — BP 116/84 | HR 81 | Temp 98.2°F | Resp 16 | Ht 67.0 in | Wt 211.0 lb

## 2022-12-01 DIAGNOSIS — I10 Essential (primary) hypertension: Secondary | ICD-10-CM | POA: Diagnosis not present

## 2022-12-01 DIAGNOSIS — Z0000 Encounter for general adult medical examination without abnormal findings: Secondary | ICD-10-CM

## 2022-12-01 DIAGNOSIS — R739 Hyperglycemia, unspecified: Secondary | ICD-10-CM | POA: Diagnosis not present

## 2022-12-01 DIAGNOSIS — E222 Syndrome of inappropriate secretion of antidiuretic hormone: Secondary | ICD-10-CM

## 2022-12-01 DIAGNOSIS — Z0001 Encounter for general adult medical examination with abnormal findings: Secondary | ICD-10-CM

## 2022-12-01 DIAGNOSIS — Z23 Encounter for immunization: Secondary | ICD-10-CM

## 2022-12-01 LAB — BASIC METABOLIC PANEL
BUN: 16 mg/dL (ref 6–23)
CO2: 24 mEq/L (ref 19–32)
Calcium: 9.2 mg/dL (ref 8.4–10.5)
Chloride: 100 mEq/L (ref 96–112)
Creatinine, Ser: 0.74 mg/dL (ref 0.40–1.50)
GFR: 92.87 mL/min (ref >60.00)
Glucose, Bld: 121 mg/dL — ABNORMAL HIGH (ref 70–99)
Potassium: 3.9 mEq/L (ref 3.5–5.1)
Sodium: 133 mEq/L — ABNORMAL LOW (ref 135–145)

## 2022-12-01 LAB — HEMOGLOBIN A1C: Hgb A1c MFr Bld: 5.6 % (ref 4.6–6.5)

## 2022-12-01 MED ORDER — OLMESARTAN MEDOXOMIL 20 MG PO TABS
20.0000 mg | ORAL_TABLET | Freq: Every day | ORAL | 1 refills | Status: DC
Start: 2022-12-01 — End: 2023-06-02

## 2022-12-01 MED ORDER — BOOSTRIX 5-2.5-18.5 LF-MCG/0.5 IM SUSP
0.5000 mL | Freq: Once | INTRAMUSCULAR | 0 refills | Status: AC
Start: 2022-12-01 — End: 2022-12-01

## 2022-12-01 NOTE — Progress Notes (Signed)
Subjective:  Patient ID: [name] Holder, male    DOB: Nov 08, 1955  Age: 67 y.o. MRN: [id number]  CC: Annual Exam, Hypertension, Anemia, and Hyperlipidemia   HPI [name] Holder presents for a CPX and f/up --  He is using oxygen intermittently. He has rare dizziness but denies CP, cough, or edema.  Outpatient Medications Prior to Visit  Medication Sig Dispense Refill   abiraterone acetate (ZYTIGA) 250 MG tablet Take 4 tablets (1,000 mg total) by mouth daily. 120 tablet 5   acetaminophen (TYLENOL) 325 MG tablet Take 325 mg by mouth daily as needed for fever.     albuterol (PROVENTIL) (2.5 MG/3ML) 0.083% nebulizer solution Take 3 mLs (2.5 mg total) by nebulization every 6 (six) hours as needed for wheezing or shortness of breath. 360 mL 11   albuterol (VENTOLIN HFA) 108 (90 Base) MCG/ACT inhaler Inhale 2 puffs into the lungs every 6 (six) hours as needed for wheezing or shortness of breath. 18 g 3   azelastine (ASTELIN) 0.1 % nasal spray Place 2 puffs into the nose in the morning and at bedtime. 90 mL 1   CALCIUM-VITAMIN D PO Take 1 tablet by mouth 2 (two) times daily.      carvedilol (COREG) 3.125 MG tablet TAKE 1 TABLET 2 TIMES A DAY WITH BREAKFAST & SUPPER 180 tablet 0   denosumab (PROLIA) 60 MG/ML SOSY injection Inject 60 mg into the skin every 6 (six) months.     DULoxetine (CYMBALTA) 20 MG capsule TAKE ONE CAPSULE DAILY 30 capsule 1   Famotidine (PEPCID AC PO) Take 1 tablet by mouth at bedtime.     fluticasone (FLONASE) 50 MCG/ACT nasal spray Place 2 sprays into both nostrils daily. 48 g 1   Fluticasone-Umeclidin-Vilant (TRELEGY ELLIPTA) 100-62.5-25 MCG/ACT AEPB Inhale 1 puff into the lungs daily. 120 each 3   furosemide (LASIX) 20 MG tablet TAKE ONE TABLET TWICE DAILY 180 tablet 0   leuprolide (LUPRON) 11.25 MG injection Inject 11.25 mg into the muscle every 3 (three) months.     lubiprostone (AMITIZA) 24 MCG capsule Take 1 capsule (24 mcg total) by mouth 2 (two) times daily with  a meal. 180 capsule 1   meclizine (ANTIVERT) 25 MG tablet Take 1 tablet (25 mg total) by mouth 3 (three) times daily as needed for dizziness. 270 tablet 0   Multiple Vitamins-Minerals (CENTRUM SILVER 50+MEN PO) Take 1 tablet by mouth daily.     NARCAN 4 MG/0.1ML LIQD nasal spray kit Place 0.4 mg into the nose once as needed (overdose).      ondansetron (ZOFRAN) 4 MG tablet Take 1 tablet (4 mg total) by mouth every 6 (six) hours as needed for nausea. 20 tablet 0   oxyCODONE (OXY IR/ROXICODONE) 5 MG immediate release tablet Take 1 tablet (5 mg total) by mouth every 4 (four) hours as needed for severe pain. 180 tablet 0   polyethylene glycol powder (GLYCOLAX/MIRALAX) 17 GM/SCOOP powder Take 17 g (1 capful) by mouth daily as directed. 510 g 6   predniSONE (DELTASONE) 5 MG tablet Take 1 tablet (5 mg total) by mouth 2 (two) times daily. 90 tablet 3   sodium chloride 1 g tablet Take 1 g by mouth 2 (two) times daily with a meal.     solifenacin (VESICARE) 5 MG tablet Take 5 mg by mouth daily.     Specialty Vitamins Products (VISTA ADVANCED CAROTENOID) CAPS Take 1 capsule by mouth every evening.     vitamin C (ASCORBIC ACID)  500 MG tablet Take 500 mg by mouth every evening.     vitamin E 180 MG (400 UNITS) capsule Take 400 Units by mouth every evening.     olmesartan (BENICAR) 40 MG tablet TAKE ONE TABLET ONCE DAILY 90 tablet 1   No facility-administered medications prior to visit.    ROS Review of Systems  Constitutional: Negative.  Negative for appetite change, diaphoresis, fatigue and unexpected weight change.  HENT: Negative.    Eyes: Negative.   Respiratory:  Positive for cough and shortness of breath. Negative for chest tightness and wheezing.   Cardiovascular:  Negative for chest pain, palpitations and leg swelling.  Gastrointestinal:  Negative for abdominal pain, diarrhea, nausea and vomiting.  Genitourinary: Negative.  Negative for difficulty urinating.  Musculoskeletal: Negative.   Negative for arthralgias and myalgias.  Skin: Negative.   Neurological:  Positive for dizziness. Negative for weakness, light-headedness and numbness.  Hematological:  Negative for adenopathy. Does not bruise/bleed easily.    Objective:  BP 116/84 (BP Location: Left Arm, Patient Position: Sitting, Cuff Size: Large)   Pulse 81   Temp 98.2 F (36.8 C) (Oral)   Resp 16   Ht 5\' 7"  (1.702 m)   Wt 211 lb (95.7 kg)   SpO2 95%   BMI 33.05 kg/m   BP Readings from Last 3 Encounters:  12/01/22 116/84  11/12/22 100/70  09/09/22 99/61    Wt Readings from Last 3 Encounters:  12/01/22 211 lb (95.7 kg)  11/12/22 211 lb 3.2 oz (95.8 kg)  09/03/22 228 lb (103.4 kg)    Physical Exam Vitals reviewed.  Constitutional:      General: He is not in acute distress.    Appearance: He is ill-appearing. He is not toxic-appearing or diaphoretic.  HENT:     Mouth/Throat:     Mouth: Mucous membranes are moist.  Eyes:     General: No scleral icterus.    Conjunctiva/sclera: Conjunctivae normal.  Cardiovascular:     Rate and Rhythm: Normal rate and regular rhythm.     Heart sounds: No murmur heard. Pulmonary:     Effort: Pulmonary effort is normal.     Breath sounds: No stridor. No wheezing, rhonchi or rales.  Abdominal:     General: Abdomen is flat.     Palpations: There is no mass.     Tenderness: There is no abdominal tenderness. There is no guarding.     Hernia: No hernia is present.  Musculoskeletal:        General: Normal range of motion.     Cervical back: Neck supple.     Right lower leg: No edema.     Left lower leg: No edema.  Lymphadenopathy:     Cervical: No cervical adenopathy.  Skin:    General: Skin is warm and dry.  Neurological:     General: No focal deficit present.     Mental Status: He is alert. Mental status is at baseline.  Psychiatric:        Mood and Affect: Mood normal.        Behavior: Behavior normal.     Lab Results  Component Value Date   WBC 9.9  11/12/2022   HGB 12.4 (L) 11/12/2022   HCT 35.4 (L) 11/12/2022   PLT 304 11/12/2022   GLUCOSE 121 (H) 12/01/2022   CHOL 173 10/28/2021   TRIG 167.0 (H) 10/28/2021   HDL 38.10 (L) 10/28/2021   LDLCALC 101 (H) 10/28/2021   ALT 15 11/12/2022  AST 12 (L) 11/12/2022   NA 133 (L) 12/01/2022   K 3.9 12/01/2022   CL 100 12/01/2022   CREATININE 0.74 12/01/2022   BUN 16 12/01/2022   CO2 24 12/01/2022   TSH 1.025 09/06/2022   PSA 1.9 11/12/2022   INR 1.1 09/17/2021   HGBA1C 5.6 12/01/2022    ECHOCARDIOGRAM COMPLETE  Result Date: 09/04/2022    ECHOCARDIOGRAM REPORT   Patient Name:   TI PELKEY Date of Exam: 09/04/2022 Medical Rec #:  [id number]         Height:       67.0 in Accession #:    [id number]        Weight:       228.0 lb Date of Birth:  12-Oct-1955         BSA:          2.138 m Patient Age:    66 years          BP:           130/70 mmHg Patient Gender: M                 HR:           98 bpm. Exam Location:  Inpatient Procedure: 2D Echo, Cardiac Doppler, Color Doppler and Intracardiac            Opacification Agent Indications:    R55 Syncope  History:        Patient has no prior history of Echocardiogram examinations.                 COPD, Signs/Symptoms:Syncope; Risk Factors:Former Smoker,                 Dyslipidemia and Hypertension.  Sonographer:    Dondra Prader RVT RCS Referring Phys: [phone] [facility] LATIF [facility]  Sonographer Comments: Technically difficult study due to poor echo windows, suboptimal parasternal window, suboptimal apical window and suboptimal subcostal window. IMPRESSIONS  1. Left ventricular ejection fraction, by estimation, is 65 to 70%. The left ventricle has normal function. The left ventricle has no regional wall motion abnormalities. There is mild left ventricular hypertrophy. Left ventricular diastolic parameters are consistent with Grade I diastolic dysfunction (impaired relaxation).  2. Right ventricular systolic function is normal. The right ventricular size is  normal. Tricuspid regurgitation signal is inadequate for assessing PA pressure.  3. The mitral valve is normal in structure. No evidence of mitral valve regurgitation. No evidence of mitral stenosis.  4. The aortic valve was not well visualized. Aortic valve regurgitation is not visualized. No aortic stenosis is present.  5. Aortic dilatation noted. There is mild dilatation of the ascending aorta, measuring 38 mm.  6. The inferior vena cava is dilated in size with >50% respiratory variability, suggesting right atrial pressure of 8 mmHg. FINDINGS  Left Ventricle: Left ventricular ejection fraction, by estimation, is 65 to 70%. The left ventricle has normal function. The left ventricle has no regional wall motion abnormalities. Definity contrast agent was given IV to delineate the left ventricular  endocardial borders. The left ventricular internal cavity size was normal in size. There is mild left ventricular hypertrophy. Left ventricular diastolic parameters are consistent with Grade I diastolic dysfunction (impaired relaxation). Right Ventricle: The right ventricular size is normal. No increase in right ventricular wall thickness. Right ventricular systolic function is normal. Tricuspid regurgitation signal is inadequate for assessing PA pressure. Left Atrium: Left atrial size was normal in size. Right Atrium: Right atrial  size was normal in size. Pericardium: There is no evidence of pericardial effusion. Presence of epicardial fat layer. Mitral Valve: The mitral valve is normal in structure. No evidence of mitral valve regurgitation. No evidence of mitral valve stenosis. Tricuspid Valve: The tricuspid valve is not well visualized. Tricuspid valve regurgitation is not demonstrated. Aortic Valve: The aortic valve was not well visualized. Aortic valve regurgitation is not visualized. No aortic stenosis is present. Aortic valve mean gradient measures 6.0 mmHg. Aortic valve peak gradient measures 9.1 mmHg. Aortic valve  area, by VTI measures 1.80 cm. Pulmonic Valve: The pulmonic valve was not well visualized. Pulmonic valve regurgitation is not visualized. Aorta: The aortic root is normal in size and structure and aortic dilatation noted. There is mild dilatation of the ascending aorta, measuring 38 mm. Venous: The inferior vena cava is dilated in size with greater than 50% respiratory variability, suggesting right atrial pressure of 8 mmHg. IAS/Shunts: The interatrial septum was not well visualized.  LEFT VENTRICLE PLAX 2D LVIDd:         3.90 cm   Diastology LVIDs:         2.40 cm   LV e' medial:    5.25 cm/s LV PW:         1.20 cm   LV E/e' medial:  11.8 LV IVS:        1.50 cm   LV e' lateral:   10.10 cm/s LVOT diam:     1.90 cm   LV E/e' lateral: 6.1 LV SV:         51 LV SV Index:   24 LVOT Area:     2.84 cm  RIGHT VENTRICLE             IVC RV Basal diam:  3.10 cm     IVC diam: 2.20 cm RV S prime:     18.00 cm/s TAPSE (M-mode): 2.9 cm LEFT ATRIUM             Index        RIGHT ATRIUM           Index LA diam:        3.90 cm 1.82 cm/m   RA Area:     13.40 cm LA Vol (A2C):   52.7 ml 24.65 ml/m  RA Volume:   31.20 ml  14.59 ml/m LA Vol (A4C):   33.6 ml 15.72 ml/m LA Biplane Vol: 38.1 ml 17.82 ml/m  AORTIC VALVE                     PULMONIC VALVE AV Area (Vmax):    1.67 cm      PV Vmax:       1.01 m/s AV Area (Vmean):   1.67 cm      PV Peak grad:  4.1 mmHg AV Area (VTI):     1.80 cm AV Vmax:           151.00 cm/s AV Vmean:          109.700 cm/s AV VTI:            0.284 m AV Peak Grad:      9.1 mmHg AV Mean Grad:      6.0 mmHg LVOT Vmax:         88.70 cm/s LVOT Vmean:        64.800 cm/s LVOT VTI:          0.180 m LVOT/AV VTI ratio: 0.63  AORTA  Ao Root diam: 3.45 cm Ao Asc diam:  3.80 cm Ao Arch diam: 3.3 cm MITRAL VALVE MV Area (PHT): 4.57 cm    SHUNTS MV Decel Time: 166 msec    Systemic VTI:  0.18 m MV E velocity: 61.90 cm/s  Systemic Diam: 1.90 cm MV A velocity: 98.00 cm/s MV E/A ratio:  0.63 Epifanio Lesches MD  Electronically signed by Epifanio Lesches MD Signature Date/Time: 09/04/2022/4:53:02 PM    Final    DG CHEST PORT 1 VIEW  Result Date: 09/04/2022 CLINICAL DATA:  Shortness of breath EXAM: PORTABLE CHEST 1 VIEW COMPARISON:  09/03/2022 and prior studies FINDINGS: The cardiomediastinal silhouette is unchanged. Patchy opacities within the mid and LOWER RIGHT lung are again identified as well as subsegmental atelectasis/scarring within the LEFT LOWER lung. There has been little interval change since the prior study. There is no evidence of pneumothorax or large pleural effusion. IMPRESSION: Little significant change in patchy opacities within the mid and LOWER RIGHT lung and subsegmental atelectasis/scarring within the LEFT LOWER lung. Electronically Signed   By: Harmon Pier M.D.   On: 09/04/2022 10:33   DG Chest Portable 1 View  Result Date: 09/03/2022 CLINICAL DATA:  Shortness of breath EXAM: PORTABLE CHEST 1 VIEW COMPARISON:  04/15/2022 FINDINGS: The heart size and mediastinal contours are within normal limits. Patchy airspace opacities in the periphery of the right lower lobe. Left basilar scarring or atelectasis. No pneumothorax. The visualized skeletal structures are unremarkable. IMPRESSION: Patchy airspace opacities in the periphery of the right lower lobe, suspicious for pneumonia. Radiographic follow-up to resolution is recommended. Electronically Signed   By: Duanne Guess D.O.   On: 09/03/2022 12:36    Assessment & Plan:   Essential hypertension - His BP is well controlled. -     Basic metabolic panel; Future -     Olmesartan Medoxomil; Take 1 tablet (20 mg total) by mouth daily.  Dispense: 90 tablet; Refill: 1  Encounter for general adult medical examination with abnormal findings- Exam completed, labs reviewed, vaccines reviewed and updated, cancer screenings are up-to-date, patient education was given.  SIADH (syndrome of inappropriate ADH production) (HCC)- Na+ is normal. -      Basic metabolic panel; Future  Hyperglycemia- A1C is normal. -     Basic metabolic panel; Future -     Hemoglobin A1c; Future  Need for prophylactic vaccination with combined diphtheria-tetanus-pertussis (DTP) vaccine -     Boostrix; Inject 0.5 mLs into the muscle once for 1 dose.  Dispense: 0.5 mL; Refill: 0     Follow-up: Return in about 6 months (around 06/03/2023).  Sanda Linger, MD

## 2022-12-01 NOTE — Patient Instructions (Signed)
Health Maintenance, Male Adopting a healthy lifestyle and getting preventive care are important in promoting health and wellness. Ask your health care provider about: The right schedule for you to have regular tests and exams. Things you can do on your own to prevent diseases and keep yourself healthy. What should I know about diet, weight, and exercise? Eat a healthy diet  Eat a diet that includes plenty of vegetables, fruits, low-fat dairy products, and lean protein. Do not eat a lot of foods that are high in solid fats, added sugars, or sodium. Maintain a healthy weight Body mass index (BMI) is a measurement that can be used to identify possible weight problems. It estimates body fat based on height and weight. Your health care provider can help determine your BMI and help you achieve or maintain a healthy weight. Get regular exercise Get regular exercise. This is one of the most important things you can do for your health. Most adults should: Exercise for at least 150 minutes each week. The exercise should increase your heart rate and make you sweat (moderate-intensity exercise). Do strengthening exercises at least twice a week. This is in addition to the moderate-intensity exercise. Spend less time sitting. Even light physical activity can be beneficial. Watch cholesterol and blood lipids Have your blood tested for lipids and cholesterol at 69 years of age, then have this test every 5 years. You may need to have your cholesterol levels checked more often if: Your lipid or cholesterol levels are high. You are older than 69 years of age. You are at high risk for heart disease. What should I know about cancer screening? Many types of cancers can be detected early and may often be prevented. Depending on your health history and family history, you may need to have cancer screening at various ages. This may include screening for: Colorectal cancer. Prostate cancer. Skin cancer. Lung  cancer. What should I know about heart disease, diabetes, and high blood pressure? Blood pressure and heart disease High blood pressure causes heart disease and increases the risk of stroke. This is more likely to develop in people who have high blood pressure readings or are overweight. Talk with your health care provider about your target blood pressure readings. Have your blood pressure checked: Every 3-5 years if you are 18-39 years of age. Every year if you are 40 years old or older. If you are between the ages of 65 and 75 and are a current or former smoker, ask your health care provider if you should have a one-time screening for abdominal aortic aneurysm (AAA). Diabetes Have regular diabetes screenings. This checks your fasting blood sugar level. Have the screening done: Once every three years after age 45 if you are at a normal weight and have a low risk for diabetes. More often and at a younger age if you are overweight or have a high risk for diabetes. What should I know about preventing infection? Hepatitis B If you have a higher risk for hepatitis B, you should be screened for this virus. Talk with your health care provider to find out if you are at risk for hepatitis B infection. Hepatitis C Blood testing is recommended for: Everyone born from 1945 through 1965. Anyone with known risk factors for hepatitis C. Sexually transmitted infections (STIs) You should be screened each year for STIs, including gonorrhea and chlamydia, if: You are sexually active and are younger than 69 years of age. You are older than 69 years of age and your   health care provider tells you that you are at risk for this type of infection. Your sexual activity has changed since you were last screened, and you are at increased risk for chlamydia or gonorrhea. Ask your health care provider if you are at risk. Ask your health care provider about whether you are at high risk for HIV. Your health care provider  may recommend a prescription medicine to help prevent HIV infection. If you choose to take medicine to prevent HIV, you should first get tested for HIV. You should then be tested every 3 months for as long as you are taking the medicine. Follow these instructions at home: Alcohol use Do not drink alcohol if your health care provider tells you not to drink. If you drink alcohol: Limit how much you have to 0-2 drinks a day. Know how much alcohol is in your drink. In the [place] one drink equals one 12 oz bottle of beer (355 mL), one 5 oz glass of wine (148 mL), or one 1 oz glass of hard liquor (44 mL). Lifestyle Do not use any products that contain nicotine or tobacco. These products include cigarettes, chewing tobacco, and vaping devices, such as e-cigarettes. If you need help quitting, ask your health care provider. Do not use street drugs. Do not share needles. Ask your health care provider for help if you need support or information about quitting drugs. General instructions Schedule regular health, dental, and eye exams. Stay current with your vaccines. Tell your health care provider if: You often feel depressed. You have ever been abused or do not feel safe at home. Summary Adopting a healthy lifestyle and getting preventive care are important in promoting health and wellness. Follow your health care provider's instructions about healthy diet, exercising, and getting tested or screened for diseases. Follow your health care provider's instructions on monitoring your cholesterol and blood pressure. This information is not intended to replace advice given to you by your health care provider. Make sure you discuss any questions you have with your health care provider. Document Revised: 12/02/2020 Document Reviewed: 12/02/2020 Elsevier Patient Education  2023 [employer]  

## 2022-12-05 DIAGNOSIS — E669 Obesity, unspecified: Secondary | ICD-10-CM | POA: Diagnosis not present

## 2022-12-05 DIAGNOSIS — J4489 Other specified chronic obstructive pulmonary disease: Secondary | ICD-10-CM | POA: Diagnosis not present

## 2022-12-05 DIAGNOSIS — J189 Pneumonia, unspecified organism: Secondary | ICD-10-CM | POA: Diagnosis not present

## 2022-12-05 DIAGNOSIS — K5903 Drug induced constipation: Secondary | ICD-10-CM | POA: Diagnosis not present

## 2022-12-09 ENCOUNTER — Other Ambulatory Visit: Payer: Self-pay | Admitting: Internal Medicine

## 2022-12-09 DIAGNOSIS — F410 Panic disorder [episodic paroxysmal anxiety] without agoraphobia: Secondary | ICD-10-CM

## 2022-12-15 ENCOUNTER — Ambulatory Visit (INDEPENDENT_AMBULATORY_CARE_PROVIDER_SITE_OTHER): Payer: Medicare Other | Admitting: Internal Medicine

## 2022-12-15 ENCOUNTER — Encounter: Payer: Self-pay | Admitting: Internal Medicine

## 2022-12-15 VITALS — BP 118/86 | HR 85 | Temp 99.0°F | Ht 67.0 in | Wt 212.0 lb

## 2022-12-15 DIAGNOSIS — J690 Pneumonitis due to inhalation of food and vomit: Secondary | ICD-10-CM | POA: Diagnosis not present

## 2022-12-15 MED ORDER — CEFDINIR 300 MG PO CAPS: 300.0000 mg | ORAL_CAPSULE | Freq: Two times a day (BID) | ORAL | 0 refills | Status: AC

## 2022-12-15 NOTE — Patient Instructions (Signed)
We have sent in omnicef to take 1 pill twice a day for 1 week to cover the lungs.

## 2022-12-15 NOTE — Progress Notes (Unsigned)
   Subjective:   Patient ID: [name] Holder, male    DOB: 10/03/1955, 67 y.o.   MRN: [id number]  HPI The patient is a 67 YO man coming in for cough and possible aspiration of stomach contents while sleeping last night. Wife with him and helps to provide history. This has happened before and typically leads to pneumonia.   Review of Systems  Constitutional:  Positive for fatigue.  HENT: Negative.    Eyes: Negative.   Respiratory:  Positive for cough and shortness of breath. Negative for chest tightness.   Cardiovascular:  Negative for chest pain, palpitations and leg swelling.  Gastrointestinal:  Negative for abdominal distention, abdominal pain, constipation, diarrhea, nausea and vomiting.  Musculoskeletal: Negative.   Skin: Negative.   Neurological: Negative.   Psychiatric/Behavioral: Negative.      Objective:  Physical Exam Constitutional:      Appearance: He is well-developed.  HENT:     Head: Normocephalic and atraumatic.  Cardiovascular:     Rate and Rhythm: Normal rate and regular rhythm.  Pulmonary:     Effort: Pulmonary effort is normal. No respiratory distress.     Breath sounds: Rhonchi present. No wheezing or rales.  Abdominal:     General: Bowel sounds are normal. There is no distension.     Palpations: Abdomen is soft.     Tenderness: There is no abdominal tenderness. There is no rebound.  Musculoskeletal:     Cervical back: Normal range of motion.  Skin:    General: Skin is warm and dry.  Neurological:     Mental Status: He is alert and oriented to person, place, and time.     Coordination: Coordination abnormal.     Vitals:   12/15/22 1345  BP: 118/86  Pulse: 85  Temp: 99 F (37.2 C)  TempSrc: Oral  SpO2: 97%  Weight: 212 lb (96.2 kg)  Height: 5\' 7"  (1.702 m)    Assessment & Plan:  Visit time 20 minutes in face to face communication with patient and coordination of care, additional 10 minutes spent in record review, coordination or care,  ordering tests, communicating/referring to other healthcare professionals, documenting in medical records all on the same day of the visit for total time 30 minutes spent on the visit.

## 2022-12-16 ENCOUNTER — Encounter: Payer: Self-pay | Admitting: Internal Medicine

## 2022-12-16 NOTE — Assessment & Plan Note (Signed)
Patient did have witnessed aspiration last night while sleeping. We did discuss that this typically leads to right sided pneumonia which is where he has some rhonchi on exam. He has had this happen before and pending GI workup soon. He does have asthma/copd and lungs are very susceptible as well as active cancer treatment for prostate cancer. Rx omnicef 1 week supply and we talked about how some of the problems in the lungs are chemical from stomach acid and will not be helped by antibiotics. Likely he will develop symptoms for 1-2 weeks. If worsening SOB may need increase to chronic steroid dosing he will call or return. Continue trelegy and albuterol nebs prn.

## 2023-01-01 DIAGNOSIS — R351 Nocturia: Secondary | ICD-10-CM | POA: Diagnosis not present

## 2023-01-01 DIAGNOSIS — Z8551 Personal history of malignant neoplasm of bladder: Secondary | ICD-10-CM | POA: Diagnosis not present

## 2023-01-04 ENCOUNTER — Other Ambulatory Visit: Payer: Self-pay | Admitting: Hematology

## 2023-01-04 ENCOUNTER — Other Ambulatory Visit: Payer: Self-pay | Admitting: Internal Medicine

## 2023-01-04 DIAGNOSIS — E222 Syndrome of inappropriate secretion of antidiuretic hormone: Secondary | ICD-10-CM | POA: Diagnosis not present

## 2023-01-04 DIAGNOSIS — K219 Gastro-esophageal reflux disease without esophagitis: Secondary | ICD-10-CM | POA: Diagnosis not present

## 2023-01-04 DIAGNOSIS — I10 Essential (primary) hypertension: Secondary | ICD-10-CM

## 2023-01-04 DIAGNOSIS — D131 Benign neoplasm of stomach: Secondary | ICD-10-CM | POA: Diagnosis not present

## 2023-01-05 DIAGNOSIS — J189 Pneumonia, unspecified organism: Secondary | ICD-10-CM | POA: Diagnosis not present

## 2023-01-05 DIAGNOSIS — J4489 Other specified chronic obstructive pulmonary disease: Secondary | ICD-10-CM | POA: Diagnosis not present

## 2023-01-05 DIAGNOSIS — E669 Obesity, unspecified: Secondary | ICD-10-CM | POA: Diagnosis not present

## 2023-01-05 DIAGNOSIS — K5903 Drug induced constipation: Secondary | ICD-10-CM | POA: Diagnosis not present

## 2023-01-09 ENCOUNTER — Other Ambulatory Visit: Payer: Self-pay | Admitting: Pulmonary Disease

## 2023-01-09 DIAGNOSIS — J4520 Mild intermittent asthma, uncomplicated: Secondary | ICD-10-CM

## 2023-01-18 ENCOUNTER — Telehealth: Payer: Self-pay

## 2023-01-18 ENCOUNTER — Other Ambulatory Visit: Payer: Self-pay | Admitting: Nurse Practitioner

## 2023-01-18 ENCOUNTER — Other Ambulatory Visit: Payer: Self-pay

## 2023-01-18 ENCOUNTER — Other Ambulatory Visit (HOSPITAL_COMMUNITY): Payer: Self-pay

## 2023-01-18 DIAGNOSIS — C7951 Secondary malignant neoplasm of bone: Secondary | ICD-10-CM

## 2023-01-18 MED ORDER — OXYCODONE HCL 5 MG PO TABS
5.0000 mg | ORAL_TABLET | ORAL | 0 refills | Status: DC | PRN
Start: 2023-01-18 — End: 2023-02-22
  Filled 2023-01-18: qty 180, 30d supply, fill #0

## 2023-01-18 NOTE — Telephone Encounter (Signed)
Patient called in stating he needs a refill on his Oxycodone 5mg  refilled. She stated his wife is going out of town on Wednesday could we please call it in for him as soon as possible.

## 2023-01-19 ENCOUNTER — Other Ambulatory Visit (HOSPITAL_COMMUNITY): Payer: Self-pay

## 2023-01-20 ENCOUNTER — Other Ambulatory Visit (HOSPITAL_COMMUNITY): Payer: Self-pay

## 2023-01-21 ENCOUNTER — Telehealth (INDEPENDENT_AMBULATORY_CARE_PROVIDER_SITE_OTHER): Payer: Medicare Other | Admitting: Internal Medicine

## 2023-01-21 DIAGNOSIS — F410 Panic disorder [episodic paroxysmal anxiety] without agoraphobia: Secondary | ICD-10-CM

## 2023-01-21 DIAGNOSIS — J4489 Other specified chronic obstructive pulmonary disease: Secondary | ICD-10-CM | POA: Diagnosis not present

## 2023-01-21 DIAGNOSIS — J690 Pneumonitis due to inhalation of food and vomit: Secondary | ICD-10-CM

## 2023-01-21 MED ORDER — AMOXICILLIN-POT CLAVULANATE 500-125 MG PO TABS: 1.0000 | ORAL_TABLET | Freq: Three times a day (TID) | ORAL | 0 refills | Status: DC

## 2023-01-21 NOTE — Progress Notes (Signed)
Patient ID: [name] Holder, male   DOB: [birth year], 66 y.o.   MRN: [id number]  Virtual Visit via Video Note  I connected with [name] Holder on 01/23/23 at  3:00 PM EDT by a video enabled telemedicine application and verified that I am speaking with the correct person using two identifiers.  Location of all participants today Patient: at home Provider: at office   I discussed the limitations of evaluation and management by telemedicine and the availability of in person appointments. The patient expressed understanding and agreed to proceed.  History of Present Illness: Here with c/o recurrent aspiration events and hospn in past, currently being eval per GI, but had another event last pm, with mild sob today and scant cough, but has started like this more than once prior requiring hospn, asking for antibx now for tx and prevention of hospn.  Pt denies chest pain, increased sob or doe, wheezing, orthopnea, PND, increased LE swelling, palpitations, dizziness or syncope.  Pt denies polydipsia, polyuria, or new focal neuro s/s.   Past Medical History:  Diagnosis Date   Allergic rhinitis    At risk for sleep apnea    STOP-BANG SCORE = 5    (routed to pt's pcp 09-08-2018)   Bladder cancer (HCC) dx'd 2020   Chronic constipation    Chronic low back pain    Chronic pain    DOE (dyspnea on exertion)    History of kidney stones    History of recent pneumonia 08/10/2018   acute bronchopneumonia;  follow-up cxr 08-28-2018 in epic   History of vertebral compression fracture    T10 and T12 s/p kyphoplasty   Hx of colonic polyps    Hyperplasia of prostate with lower urinary tract symptoms (LUTS)    Hypertension    Mild persistent asthma    followed by pcp   Numbness in both legs    secondary to a fall, per pt has had work-up and couldn't find anything   Osteopenia    Prostate cancer metastatic to pelvis [facility]) urologist-- dr borden/  oncologist -- dr Clelia Croft   dx 2014 advanced prostate cancer  with pelvid adenopathy,  Stage T2c, Gleason 7;  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   Wears glasses    Past Surgical History:  Procedure Laterality Date   COLONOSCOPY     IR KYPHO EA ADDL LEVEL THORACIC OR LUMBAR  08/17/2018   IR KYPHO EA ADDL LEVEL THORACIC OR LUMBAR  08/26/2018   IR KYPHO LUMBAR INC FX REDUCE BONE BX UNI/BIL CANNULATION INC/IMAGING  08/26/2018   IR KYPHO THORACIC WITH BONE BIOPSY  08/17/2018   IR RADIOLOGIST EVAL & MGMT  08/11/2018   IR RADIOLOGIST EVAL & MGMT  08/24/2018   LYMPHADENECTOMY Left 01/05/2013   Procedure: ROBOTIC LYMPHADENECTOMY;  Surgeon: Crecencio Mc, MD;  Location: [facility];  Service: Urology;  Laterality: Left;   PROSTATE BIOPSY     11/12 positive biopsies   ROTATOR CUFF REPAIR Left 2012   TRANSURETHRAL RESECTION OF BLADDER TUMOR N/A 09/15/2018   Procedure: TRANSURETHRAL RESECTION OF BLADDER TUMOR (TURBT)WITH CYSTOSCOPY/ POST OPERATIVE INSTILLATION OF GEMCITABINE;  Surgeon: Heloise Purpura, MD;  Location: [facility];  Service: Urology;  Laterality: N/A;  GENERAL ANESTHESIA WITH PARALYSIS   UMBILICAL HERNIA REPAIR  09-02-2001   dr t. price @[facility]     reports that he quit smoking about 10 years ago. His smoking use included cigarettes. He has a 25.00 pack-year smoking history. He has never used smokeless tobacco. He reports that he does  not currently use alcohol after a past usage of about 2.0 standard drinks of alcohol per week. He reports that he does not use drugs. family history includes Arthritis in his mother; Cancer in his sister; Diabetes in his mother; Heart attack in his father; Hyperlipidemia in his mother; Hypertension in his father and mother; Prostate cancer in his father; Stroke in his maternal grandfather. Allergies  Allergen Reactions   Amlodipine Swelling    edema   Tetracycline Rash   Current Outpatient Medications on File Prior to Visit  Medication Sig Dispense Refill   abiraterone acetate (ZYTIGA) 250 MG tablet Take 4 tablets (1,000 mg total) by mouth daily.  120 tablet 5   acetaminophen (TYLENOL) 325 MG tablet Take 325 mg by mouth daily as needed for fever.     albuterol (PROVENTIL) (2.5 MG/3ML) 0.083% nebulizer solution Take 3 mLs (2.5 mg total) by nebulization every 6 (six) hours as needed for wheezing or shortness of breath. 360 mL 11   albuterol (VENTOLIN HFA) 108 (90 Base) MCG/ACT inhaler INHALE 2 PUFFS EVERY 6 HOURS AS NEEDED FOR WHEEZING OR SHORTNESS OF BREATH 18 g 3   azelastine (ASTELIN) 0.1 % nasal spray Place 2 puffs into the nose in the morning and at bedtime. 90 mL 1   CALCIUM-VITAMIN D PO Take 1 tablet by mouth 2 (two) times daily.      carvedilol (COREG) 3.125 MG tablet TAKE 1 TABLET 2 TIMES A DAY WITH BREAKFAST & SUPPER 180 tablet 0   denosumab (PROLIA) 60 MG/ML SOSY injection Inject 60 mg into the skin every 6 (six) months.     DULoxetine (CYMBALTA) 20 MG capsule TAKE ONE CAPSULE DAILY 30 capsule 1   Famotidine (PEPCID AC PO) Take 1 tablet by mouth at bedtime.     fluticasone (FLONASE) 50 MCG/ACT nasal spray Place 2 sprays into both nostrils daily. 48 g 1   Fluticasone-Umeclidin-Vilant (TRELEGY ELLIPTA) 100-62.5-25 MCG/ACT AEPB Inhale 1 puff into the lungs daily. 120 each 3   furosemide (LASIX) 20 MG tablet TAKE ONE TABLET TWICE DAILY 180 tablet 0   leuprolide (LUPRON) 11.25 MG injection Inject 11.25 mg into the muscle every 3 (three) months.     lubiprostone (AMITIZA) 24 MCG capsule Take 1 capsule (24 mcg total) by mouth 2 (two) times daily with a meal. 180 capsule 1   meclizine (ANTIVERT) 25 MG tablet Take 1 tablet (25 mg total) by mouth 3 (three) times daily as needed for dizziness. 270 tablet 0   Multiple Vitamins-Minerals (CENTRUM SILVER 50+MEN PO) Take 1 tablet by mouth daily.     NARCAN 4 MG/0.1ML LIQD nasal spray kit Place 0.4 mg into the nose once as needed (overdose).      olmesartan (BENICAR) 20 MG tablet Take 1 tablet (20 mg total) by mouth daily. 90 tablet 1   ondansetron (ZOFRAN) 4 MG tablet Take 1 tablet (4 mg total) by  mouth every 6 (six) hours as needed for nausea. 20 tablet 0   oxyCODONE (OXY IR/ROXICODONE) 5 MG immediate release tablet Take 1 tablet (5 mg) by mouth every 4 hours as needed for severe pain. 180 tablet 0   polyethylene glycol powder (GLYCOLAX/MIRALAX) 17 GM/SCOOP powder Take 17 g (1 capful) by mouth daily as directed. 510 g 6   predniSONE (DELTASONE) 5 MG tablet Take 1 tablet (5 mg total) by mouth 2 (two) times daily. 90 tablet 3   sodium chloride 1 g tablet Take 1 g by mouth 2 (two) times daily with a  meal.     solifenacin (VESICARE) 5 MG tablet Take 5 mg by mouth daily.     Specialty Vitamins Products (VISTA ADVANCED CAROTENOID) CAPS Take 1 capsule by mouth every evening.     vitamin C (ASCORBIC ACID) 500 MG tablet Take 500 mg by mouth every evening.     vitamin E 180 MG (400 UNITS) capsule Take 400 Units by mouth every evening.     No current facility-administered medications on file prior to visit.    Observations/Objective: Alert, NAD, appropriate mood and affect, resps normal, cn 2-12 intact, moves all 4s, no visible rash or swelling, mod nervous Lab Results  Component Value Date   WBC 9.9 11/12/2022   HGB 12.4 (L) 11/12/2022   HCT 35.4 (L) 11/12/2022   PLT 304 11/12/2022   GLUCOSE 121 (H) 12/01/2022   CHOL 173 10/28/2021   TRIG 167.0 (H) 10/28/2021   HDL 38.10 (L) 10/28/2021   LDLCALC 101 (H) 10/28/2021   ALT 15 11/12/2022   AST 12 (L) 11/12/2022   NA 133 (L) 12/01/2022   K 3.9 12/01/2022   CL 100 12/01/2022   CREATININE 0.74 12/01/2022   BUN 16 12/01/2022   CO2 24 12/01/2022   TSH 1.025 09/06/2022   PSA 1.9 11/12/2022   INR 1.1 09/17/2021   HGBA1C 5.6 12/01/2022   Assessment and Plan: See notes  Follow Up Instructions: See notes   I discussed the assessment and treatment plan with the patient. The patient was provided an opportunity to ask questions and all were answered. The patient agreed with the plan and demonstrated an understanding of the instructions.    The patient was advised to call back or seek an in-person evaluation if the symptoms worsen or if the condition fails to improve as anticipated.  Oliver Barre, MD

## 2023-01-23 ENCOUNTER — Encounter: Payer: Self-pay | Admitting: Internal Medicine

## 2023-01-23 NOTE — Assessment & Plan Note (Signed)
With mild situational worsening, reassured, delcines need for other change in tx at this time

## 2023-01-23 NOTE — Assessment & Plan Note (Signed)
O/w stable, cont inhaler prn 

## 2023-01-23 NOTE — Patient Instructions (Signed)
Please take all new medication as prescribed 

## 2023-01-23 NOTE — Assessment & Plan Note (Signed)
Recurrent, being eval per GI, pt also requests ENT, ok for tx with augmentin course bid,

## 2023-01-29 ENCOUNTER — Other Ambulatory Visit: Payer: Self-pay | Admitting: Internal Medicine

## 2023-01-29 DIAGNOSIS — I10 Essential (primary) hypertension: Secondary | ICD-10-CM

## 2023-02-02 ENCOUNTER — Other Ambulatory Visit: Payer: Self-pay | Admitting: Internal Medicine

## 2023-02-02 DIAGNOSIS — I10 Essential (primary) hypertension: Secondary | ICD-10-CM

## 2023-02-04 ENCOUNTER — Other Ambulatory Visit: Payer: Self-pay

## 2023-02-04 DIAGNOSIS — E669 Obesity, unspecified: Secondary | ICD-10-CM | POA: Diagnosis not present

## 2023-02-04 DIAGNOSIS — J189 Pneumonia, unspecified organism: Secondary | ICD-10-CM | POA: Diagnosis not present

## 2023-02-04 DIAGNOSIS — J4489 Other specified chronic obstructive pulmonary disease: Secondary | ICD-10-CM | POA: Diagnosis not present

## 2023-02-04 DIAGNOSIS — K5903 Drug induced constipation: Secondary | ICD-10-CM | POA: Diagnosis not present

## 2023-02-08 ENCOUNTER — Other Ambulatory Visit: Payer: Self-pay | Admitting: Internal Medicine

## 2023-02-08 DIAGNOSIS — T402X5A Adverse effect of other opioids, initial encounter: Secondary | ICD-10-CM

## 2023-02-10 DIAGNOSIS — K2950 Unspecified chronic gastritis without bleeding: Secondary | ICD-10-CM | POA: Diagnosis not present

## 2023-02-10 DIAGNOSIS — K5730 Diverticulosis of large intestine without perforation or abscess without bleeding: Secondary | ICD-10-CM | POA: Diagnosis not present

## 2023-02-10 DIAGNOSIS — Z86010 Personal history of colonic polyps: Secondary | ICD-10-CM | POA: Diagnosis not present

## 2023-02-10 DIAGNOSIS — K317 Polyp of stomach and duodenum: Secondary | ICD-10-CM | POA: Diagnosis not present

## 2023-02-10 DIAGNOSIS — D125 Benign neoplasm of sigmoid colon: Secondary | ICD-10-CM | POA: Diagnosis not present

## 2023-02-10 DIAGNOSIS — D120 Benign neoplasm of cecum: Secondary | ICD-10-CM | POA: Diagnosis not present

## 2023-02-10 DIAGNOSIS — D175 Benign lipomatous neoplasm of intra-abdominal organs: Secondary | ICD-10-CM | POA: Diagnosis not present

## 2023-02-10 DIAGNOSIS — Z09 Encounter for follow-up examination after completed treatment for conditions other than malignant neoplasm: Secondary | ICD-10-CM | POA: Diagnosis not present

## 2023-02-10 DIAGNOSIS — K2930 Chronic superficial gastritis without bleeding: Secondary | ICD-10-CM | POA: Diagnosis not present

## 2023-02-10 DIAGNOSIS — D123 Benign neoplasm of transverse colon: Secondary | ICD-10-CM | POA: Diagnosis not present

## 2023-02-10 DIAGNOSIS — D131 Benign neoplasm of stomach: Secondary | ICD-10-CM | POA: Diagnosis not present

## 2023-02-10 LAB — HM COLONOSCOPY

## 2023-02-11 ENCOUNTER — Inpatient Hospital Stay: Payer: Medicare Other | Attending: Hematology

## 2023-02-11 ENCOUNTER — Inpatient Hospital Stay: Payer: Medicare Other

## 2023-02-11 ENCOUNTER — Inpatient Hospital Stay: Payer: Medicare Other | Admitting: Hematology

## 2023-02-11 ENCOUNTER — Other Ambulatory Visit: Payer: Self-pay

## 2023-02-11 ENCOUNTER — Encounter: Payer: Self-pay | Admitting: Hematology

## 2023-02-11 DIAGNOSIS — C7951 Secondary malignant neoplasm of bone: Secondary | ICD-10-CM

## 2023-02-11 DIAGNOSIS — Z8546 Personal history of malignant neoplasm of prostate: Secondary | ICD-10-CM

## 2023-02-11 DIAGNOSIS — Z5111 Encounter for antineoplastic chemotherapy: Secondary | ICD-10-CM | POA: Insufficient documentation

## 2023-02-11 DIAGNOSIS — C61 Malignant neoplasm of prostate: Secondary | ICD-10-CM | POA: Insufficient documentation

## 2023-02-11 DIAGNOSIS — Z79899 Other long term (current) drug therapy: Secondary | ICD-10-CM | POA: Diagnosis not present

## 2023-02-11 LAB — CMP (CANCER CENTER ONLY)
ALT: 12 U/L (ref 0–44)
AST: 12 U/L — ABNORMAL LOW (ref 15–41)
Albumin: 4 g/dL (ref 3.5–5.0)
Alkaline Phosphatase: 50 U/L (ref 38–126)
Anion gap: 6 (ref 5–15)
BUN: 13 mg/dL (ref 8–23)
CO2: 24 mmol/L (ref 22–32)
Calcium: 9.6 mg/dL (ref 8.9–10.3)
Chloride: 108 mmol/L (ref 98–111)
Creatinine: 0.8 mg/dL (ref 0.61–1.24)
GFR, Estimated: >60 mL/min (ref >60)
Glucose, Bld: 113 mg/dL — ABNORMAL HIGH (ref 70–99)
Potassium: 3.7 mmol/L (ref 3.5–5.1)
Sodium: 138 mmol/L (ref 135–145)
Total Bilirubin: 0.5 mg/dL (ref 0.3–1.2)
Total Protein: 6.4 g/dL — ABNORMAL LOW (ref 6.5–8.1)

## 2023-02-11 LAB — CBC WITH DIFFERENTIAL (CANCER CENTER ONLY)
Abs Immature Granulocytes: 0.04 10*3/uL (ref 0.00–0.07)
Basophils Absolute: 0.1 10*3/uL (ref 0.0–0.1)
Basophils Relative: 1 %
Eosinophils Absolute: 0.1 10*3/uL (ref 0.0–0.5)
Eosinophils Relative: 1 %
HCT: 37.5 % — ABNORMAL LOW (ref 39.0–52.0)
Hemoglobin: 12.9 g/dL — ABNORMAL LOW (ref 13.0–17.0)
Immature Granulocytes: 0 %
Lymphocytes Relative: 22 %
Lymphs Abs: 2.3 10*3/uL (ref 0.7–4.0)
MCH: 29.8 pg (ref 26.0–34.0)
MCHC: 34.4 g/dL (ref 30.0–36.0)
MCV: 86.6 fL (ref 80.0–100.0)
Monocytes Absolute: 1.1 10*3/uL — ABNORMAL HIGH (ref 0.1–1.0)
Monocytes Relative: 10 %
Neutro Abs: 7 10*3/uL (ref 1.7–7.7)
Neutrophils Relative %: 66 %
Platelet Count: 294 10*3/uL (ref 150–400)
RBC: 4.33 MIL/uL (ref 4.22–5.81)
RDW: 13.5 % (ref 11.5–15.5)
WBC Count: 10.5 10*3/uL (ref 4.0–10.5)
nRBC: 0 % (ref 0.0–0.2)

## 2023-02-11 MED ORDER — ABIRATERONE ACETATE 250 MG PO TABS
1000.0000 mg | ORAL_TABLET | Freq: Every day | ORAL | 5 refills | Status: DC
Start: 2023-02-11 — End: 2023-09-08

## 2023-02-11 MED ORDER — DENOSUMAB 60 MG/ML ~~LOC~~ SOSY
60.0000 mg | PREFILLED_SYRINGE | Freq: Once | SUBCUTANEOUS | Status: AC
  Administered 2023-02-11: 60 mg via SUBCUTANEOUS
  Filled 2023-02-11: qty 1

## 2023-02-11 MED ORDER — LEUPROLIDE ACETATE (3 MONTH) 22.5 MG ~~LOC~~ KIT
22.5000 mg | PACK | Freq: Once | SUBCUTANEOUS | Status: AC
  Administered 2023-02-11: 22.5 mg via SUBCUTANEOUS
  Filled 2023-02-11: qty 22.5

## 2023-02-11 NOTE — Addendum Note (Signed)
Addended byNoralee Stain L on: 02/11/2023 01:41 PM   Modules accepted: Orders

## 2023-02-11 NOTE — Progress Notes (Signed)
[facility]   [phone] [phone]   Clinic Follow up Note   Patient Care Team: Etta Grandchild, MD as PCP - General (Internal Medicine) Kathyrn Sheriff, [facility] (Inactive) as Pharmacist (Pharmacist) Conley Rolls, [place], [place] as Referring Physician (Optometry) [name], MD as Attending Physician (Hematology and Oncology)  Date of Service:  02/11/2023  CHIEF COMPLAINT: f/u of Prostate Cancer   CURRENT THERAPY:   Zytiga 1000 mg daily with prednisone at 5 mg daily started in 2018.   Eligard 22.5 mg every 3 months.     Prolia  Injection 60 mg every 6 months.  ASSESSMENT:  [name] Holder is a 67 y.o. male with    Prostate cancer (HCC) -stage IV with node metastasis, Guardant 360 (-) in 2022 -diagnosed in 2014 with lymphadenopathy, Gleason score 7 and PSA of 23.  -He developed castration resistant in 2018.  His PSA was up to 11.3 at that time.  -He is status post T10, T12, kyphoplasty (osteoprosis related) completed on January 22 of 2020.  -he has been on Zytiga 1000 mg daily with prednisone at 5 mg daily started in 2018, Eligard 22.5 mg every 3 months and Prolia 60 mg every 6 months.  -Tolerating treatment well. His PSA has been slowly trending up with latest 1.9 in April 2024, overall very low and doubling time more than 6 months.  Will continue current therapy.   Cancer related pain -He is taking oxycodone 5 mg for 4-5 times a day, his pain is controlled overall. -We discussed long-acting MS Contin, since his pain is controlled, will hold it for now. -I added the Cymbalta last time, it is helping him will continue.  Bone metastasis -He is on calcium and vitamin D, and Prolia every 6 months  PLAN: -lab reviewed -I refill Zytiga -Pt is clinically doing well and stable -Proceed with Eligard and Prolia injections today -lab and f/u in 3 months  SUMMARY OF ONCOLOGIC HISTORY: Oncology History Overview Note   Cancer Staging  Secondary  malignant neoplasm of bone and bone marrow (HCC) Staging form: Bone - Appendicular Skeleton, Trunk, Skull, and Facial Bones, AJCC 8th Edition - Clinical: Stage Unknown (cTX, cNX, pM1) - Signed by Benjiman Core, MD on 12/10/2020     Secondary malignant neoplasm of bone and bone marrow (HCC)  08/06/2018 Initial Diagnosis   Secondary malignant neoplasm of bone and bone marrow (HCC)   12/10/2020 Cancer Staging   Staging form: Bone - Appendicular Skeleton, Trunk, Skull, and Facial Bones, AJCC 8th Edition - Clinical: Stage Unknown (cTX, cNX, pM1) - Signed by Benjiman Core, MD on 12/10/2020   Prostate cancer (HCC)  09/16/2021 Initial Diagnosis   Prostate cancer (HCC)      INTERVAL HISTORY:  [name] Holder is here for a follow up of Prostate Cancer. He was last seen by me on 11/12/2022. He presents to the clinic accompanied by wife. Pt report that his pain is under controlled. Pt state that he has been taking Oxycodone as needed. Pt report  that Cymbalta has helped.      All other systems were reviewed with the patient and are negative.  MEDICAL HISTORY:  Past Medical History:  Diagnosis Date   Allergic rhinitis    At risk for sleep apnea    STOP-BANG SCORE = 5    (routed to pt's pcp 09-08-2018)   Bladder cancer (HCC) dx'd 2020   Chronic constipation    Chronic low back pain  Chronic pain    DOE (dyspnea on exertion)    History of kidney stones    History of recent pneumonia 08/10/2018   acute bronchopneumonia;  follow-up cxr 08-28-2018 in epic   History of vertebral compression fracture    T10 and T12 s/p kyphoplasty   Hx of colonic polyps    Hyperplasia of prostate with lower urinary tract symptoms (LUTS)    Hypertension    Mild persistent asthma    followed by pcp   Numbness in both legs    secondary to a fall, per pt has had work-up and couldn't find anything   Osteopenia    Prostate cancer metastatic to pelvis [facility]) urologist-- dr borden/  oncologist -- dr Clelia Croft    dx 2014 advanced prostate cancer with pelvid adenopathy,  Stage T2c, Gleason 7;  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   Wears glasses     SURGICAL HISTORY: Past Surgical History:  Procedure Laterality Date   COLONOSCOPY     IR KYPHO EA ADDL LEVEL THORACIC OR LUMBAR  08/17/2018   IR KYPHO EA ADDL LEVEL THORACIC OR LUMBAR  08/26/2018   IR KYPHO LUMBAR INC FX REDUCE BONE BX UNI/BIL CANNULATION INC/IMAGING  08/26/2018   IR KYPHO THORACIC WITH BONE BIOPSY  08/17/2018   IR RADIOLOGIST EVAL & MGMT  08/11/2018   IR RADIOLOGIST EVAL & MGMT  08/24/2018   LYMPHADENECTOMY Left 01/05/2013   Procedure: ROBOTIC LYMPHADENECTOMY;  Surgeon: Crecencio Mc, MD;  Location: [facility];  Service: Urology;  Laterality: Left;   PROSTATE BIOPSY     11/12 positive biopsies   ROTATOR CUFF REPAIR Left 2012   TRANSURETHRAL RESECTION OF BLADDER TUMOR N/A 09/15/2018   Procedure: TRANSURETHRAL RESECTION OF BLADDER TUMOR (TURBT)WITH CYSTOSCOPY/ POST OPERATIVE INSTILLATION OF GEMCITABINE;  Surgeon: Heloise Purpura, MD;  Location: [facility];  Service: Urology;  Laterality: N/A;  GENERAL ANESTHESIA WITH PARALYSIS   UMBILICAL HERNIA REPAIR  09-02-2001   dr t. price @[facility]     I have reviewed the social history and family history with the patient and they are unchanged from previous note.  ALLERGIES:  is allergic to amlodipine and tetracycline.  MEDICATIONS:  Current Outpatient Medications  Medication Sig Dispense Refill   abiraterone acetate (ZYTIGA) 250 MG tablet Take 4 tablets (1,000 mg total) by mouth daily. 120 tablet 5   acetaminophen (TYLENOL) 325 MG tablet Take 325 mg by mouth daily as needed for fever.     albuterol (PROVENTIL) (2.5 MG/3ML) 0.083% nebulizer solution Take 3 mLs (2.5 mg total) by nebulization every 6 (six) hours as needed for wheezing or shortness of breath. 360 mL 11   albuterol (VENTOLIN HFA) 108 (90 Base) MCG/ACT inhaler INHALE 2 PUFFS EVERY 6 HOURS AS NEEDED FOR WHEEZING OR SHORTNESS OF BREATH 18 g 3    amoxicillin-clavulanate (AUGMENTIN) 500-125 MG tablet Take 1 tablet by mouth 3 (three) times daily. 20 tablet 0   azelastine (ASTELIN) 0.1 % nasal spray Place 2 puffs into the nose in the morning and at bedtime. 90 mL 1   CALCIUM-VITAMIN D PO Take 1 tablet by mouth 2 (two) times daily.      carvedilol (COREG) 3.125 MG tablet TAKE ONE TABLET TWICE DAILY WITH FOOD 180 tablet 0   denosumab (PROLIA) 60 MG/ML SOSY injection Inject 60 mg into the skin every 6 (six) months.     DULoxetine (CYMBALTA) 20 MG capsule TAKE ONE CAPSULE DAILY 30 capsule 1   Famotidine (PEPCID AC PO) Take 1 tablet by mouth at bedtime.  fluticasone (FLONASE) 50 MCG/ACT nasal spray Place 2 sprays into both nostrils daily. 48 g 1   Fluticasone-Umeclidin-Vilant (TRELEGY ELLIPTA) 100-62.5-25 MCG/ACT AEPB Inhale 1 puff into the lungs daily. 120 each 3   furosemide (LASIX) 20 MG tablet TAKE ONE TABLET TWICE DAILY 180 tablet 0   leuprolide (LUPRON) 11.25 MG injection Inject 11.25 mg into the muscle every 3 (three) months.     lubiprostone (AMITIZA) 24 MCG capsule TAKE ONE CAPSULE TWICE DAILY WITH MEAL(S) 180 capsule 0   meclizine (ANTIVERT) 25 MG tablet Take 1 tablet (25 mg total) by mouth 3 (three) times daily as needed for dizziness. 270 tablet 0   Multiple Vitamins-Minerals (CENTRUM SILVER 50+MEN PO) Take 1 tablet by mouth daily.     NARCAN 4 MG/0.1ML LIQD nasal spray kit Place 0.4 mg into the nose once as needed (overdose).      olmesartan (BENICAR) 20 MG tablet Take 1 tablet (20 mg total) by mouth daily. 90 tablet 1   ondansetron (ZOFRAN) 4 MG tablet Take 1 tablet (4 mg total) by mouth every 6 (six) hours as needed for nausea. 20 tablet 0   oxyCODONE (OXY IR/ROXICODONE) 5 MG immediate release tablet Take 1 tablet (5 mg) by mouth every 4 hours as needed for severe pain. 180 tablet 0   polyethylene glycol powder (GLYCOLAX/MIRALAX) 17 GM/SCOOP powder Take 17 g (1 capful) by mouth daily as directed. 510 g 6   predniSONE (DELTASONE)  5 MG tablet Take 1 tablet (5 mg total) by mouth 2 (two) times daily. 90 tablet 3   sodium chloride 1 g tablet Take 1 g by mouth 2 (two) times daily with a meal.     solifenacin (VESICARE) 5 MG tablet Take 5 mg by mouth daily.     Specialty Vitamins Products (VISTA ADVANCED CAROTENOID) CAPS Take 1 capsule by mouth every evening.     vitamin C (ASCORBIC ACID) 500 MG tablet Take 500 mg by mouth every evening.     vitamin E 180 MG (400 UNITS) capsule Take 400 Units by mouth every evening.     No current facility-administered medications for this visit.    PHYSICAL EXAMINATION: ECOG PERFORMANCE STATUS: 1 - Symptomatic but completely ambulatory  Vitals:   02/11/23 1209  BP: 99/70  Pulse: 93  Resp: 18  Temp: 99.2 F (37.3 C)  SpO2: 100%   Wt Readings from Last 3 Encounters:  02/11/23 213 lb 11.2 oz (96.9 kg)  12/15/22 212 lb (96.2 kg)  12/01/22 211 lb (95.7 kg)     GENERAL:alert, no distress and comfortable SKIN: skin color normal, no rashes or significant lesions EYES: normal, Conjunctiva are pink and non-injected, sclera clear  NEURO: alert & oriented x 3 with fluent speech LUNGS: (-) clear to auscultation and percussion with normal breathing effort HEART: (-) regular rate & rhythm and no murmurs and (-)no lower extremity edema ABDOMEN:(-) abdomen soft, non-tender and normal bowel sounds  LABORATORY DATA:  I have reviewed the data as listed    Latest Ref Rng & Units 02/11/2023   11:34 AM 11/12/2022   11:07 AM 09/06/2022    4:38 AM  CBC  WBC 4.0 - 10.5 K/uL 10.5  9.9  9.6   Hemoglobin 13.0 - 17.0 g/dL 09.8  11.9  14.7   Hematocrit 39.0 - 52.0 % 37.5  35.4  33.5   Platelets 150 - 400 K/uL 294  304  218         Latest Ref Rng & Units 02/11/2023  11:34 AM 12/01/2022   10:51 AM 11/12/2022   11:07 AM  CMP  Glucose 70 - 99 mg/dL 409  811  914   BUN 8 - 23 mg/dL 13  16  17    Creatinine 0.61 - 1.24 mg/dL 7.82  9.56  2.13   Sodium 135 - 145 mmol/L 138  133  133   Potassium 3.5  - 5.1 mmol/L 3.7  3.9  3.9   Chloride 98 - 111 mmol/L 108  100  102   CO2 22 - 32 mmol/L 24  24  24    Calcium 8.9 - 10.3 mg/dL 9.6  9.2  9.5   Total Protein 6.5 - 8.1 g/dL 6.4   6.6   Total Bilirubin 0.3 - 1.2 mg/dL 0.5   0.5   Alkaline Phos 38 - 126 U/L 50   46   AST 15 - 41 U/L 12   12   ALT 0 - 44 U/L 12   15       RADIOGRAPHIC STUDIES: I have personally reviewed the radiological images as listed and agreed with the findings in the report. No results found.    No orders of the defined types were placed in this encounter.  All questions were answered. The patient knows to call the clinic with any problems, questions or concerns. No barriers to learning was detected. The total time spent in the appointment was 25 minutes.     [name], MD 02/11/2023   Carolin Coy, CMA, am acting as scribe for [name], MD.   I have reviewed the above documentation for accuracy and completeness, and I agree with the above.

## 2023-02-12 LAB — PROSTATE-SPECIFIC AG, SERUM (LABCORP): Prostate Specific Ag, Serum: 2.2 ng/mL (ref 0.0–4.0)

## 2023-02-19 ENCOUNTER — Other Ambulatory Visit: Payer: Self-pay | Admitting: *Deleted

## 2023-02-19 DIAGNOSIS — C7951 Secondary malignant neoplasm of bone: Secondary | ICD-10-CM

## 2023-02-22 ENCOUNTER — Other Ambulatory Visit (HOSPITAL_COMMUNITY): Payer: Self-pay

## 2023-02-22 ENCOUNTER — Other Ambulatory Visit: Payer: Self-pay | Admitting: Hematology

## 2023-02-22 ENCOUNTER — Other Ambulatory Visit: Payer: Self-pay

## 2023-02-22 ENCOUNTER — Telehealth: Payer: Self-pay

## 2023-02-22 DIAGNOSIS — C7951 Secondary malignant neoplasm of bone: Secondary | ICD-10-CM

## 2023-02-22 MED ORDER — OXYCODONE HCL 5 MG PO TABS
5.0000 mg | ORAL_TABLET | ORAL | 0 refills | Status: DC | PRN
Start: 2023-02-22 — End: 2023-03-26
  Filled 2023-02-22: qty 180, 30d supply, fill #0

## 2023-02-22 NOTE — Telephone Encounter (Signed)
Pt called stating he called Friday 02/19/2023 requesting a refill on Oxycodone 5mg  to be sent to [facility].  Pt stated he called [facility] over the weekend and today and they do not have a refill prescription from Dr. Mosetta Putt nor Santiago Glad, NP for Oxycodone.  Pt would like to have a refill on Oxycodone 5mg  sent to [facility] today d/t pt's wife will be in [place] this morning for an appt.  Pt stated they live in [place], [place] and would like to pick the prescription up from [facility] this morning before going back home to [place], [place].  Stated this nurse will make Dr. Mosetta Putt aware.  Notified Dr. Mosetta Putt and Santiago Glad, NP of the pt's 2nd call.

## 2023-02-23 DIAGNOSIS — K2930 Chronic superficial gastritis without bleeding: Secondary | ICD-10-CM | POA: Diagnosis not present

## 2023-02-23 DIAGNOSIS — K317 Polyp of stomach and duodenum: Secondary | ICD-10-CM | POA: Diagnosis not present

## 2023-02-23 DIAGNOSIS — D120 Benign neoplasm of cecum: Secondary | ICD-10-CM | POA: Diagnosis not present

## 2023-02-23 DIAGNOSIS — D123 Benign neoplasm of transverse colon: Secondary | ICD-10-CM | POA: Diagnosis not present

## 2023-02-24 ENCOUNTER — Other Ambulatory Visit (HOSPITAL_COMMUNITY): Payer: Self-pay

## 2023-03-04 ENCOUNTER — Ambulatory Visit: Payer: Medicare Other | Admitting: Internal Medicine

## 2023-03-06 ENCOUNTER — Other Ambulatory Visit: Payer: Self-pay | Admitting: Nurse Practitioner

## 2023-03-07 DIAGNOSIS — J4489 Other specified chronic obstructive pulmonary disease: Secondary | ICD-10-CM | POA: Diagnosis not present

## 2023-03-07 DIAGNOSIS — J189 Pneumonia, unspecified organism: Secondary | ICD-10-CM | POA: Diagnosis not present

## 2023-03-07 DIAGNOSIS — K5903 Drug induced constipation: Secondary | ICD-10-CM | POA: Diagnosis not present

## 2023-03-07 DIAGNOSIS — E669 Obesity, unspecified: Secondary | ICD-10-CM | POA: Diagnosis not present

## 2023-03-09 DIAGNOSIS — M25562 Pain in left knee: Secondary | ICD-10-CM | POA: Diagnosis not present

## 2023-03-09 DIAGNOSIS — M25561 Pain in right knee: Secondary | ICD-10-CM | POA: Diagnosis not present

## 2023-03-09 DIAGNOSIS — S40012A Contusion of left shoulder, initial encounter: Secondary | ICD-10-CM | POA: Diagnosis not present

## 2023-03-11 ENCOUNTER — Encounter (INDEPENDENT_AMBULATORY_CARE_PROVIDER_SITE_OTHER): Payer: Self-pay

## 2023-03-26 ENCOUNTER — Other Ambulatory Visit: Payer: Self-pay | Admitting: Hematology

## 2023-03-26 ENCOUNTER — Telehealth: Payer: Self-pay

## 2023-03-26 ENCOUNTER — Other Ambulatory Visit: Payer: Self-pay

## 2023-03-26 DIAGNOSIS — C7951 Secondary malignant neoplasm of bone: Secondary | ICD-10-CM

## 2023-03-26 MED ORDER — OXYCODONE HCL 5 MG PO TABS: 5.0000 mg | ORAL_TABLET | ORAL | 0 refills | Status: DC | PRN

## 2023-03-26 NOTE — Telephone Encounter (Signed)
Patient called in stating he needs a refill of his Oxycodone 5mg  called in for him to pick up on Tuesday 9/3.

## 2023-03-30 ENCOUNTER — Telehealth: Payer: Self-pay

## 2023-03-30 ENCOUNTER — Other Ambulatory Visit (HOSPITAL_COMMUNITY): Payer: Self-pay

## 2023-03-30 ENCOUNTER — Other Ambulatory Visit: Payer: Self-pay

## 2023-03-30 ENCOUNTER — Other Ambulatory Visit: Payer: Self-pay | Admitting: Hematology

## 2023-03-30 DIAGNOSIS — S40012A Contusion of left shoulder, initial encounter: Secondary | ICD-10-CM | POA: Diagnosis not present

## 2023-03-30 DIAGNOSIS — M25562 Pain in left knee: Secondary | ICD-10-CM | POA: Diagnosis not present

## 2023-03-30 DIAGNOSIS — C7951 Secondary malignant neoplasm of bone: Secondary | ICD-10-CM

## 2023-03-30 MED ORDER — OXYCODONE HCL 5 MG PO TABS
5.0000 mg | ORAL_TABLET | ORAL | 0 refills | Status: DC | PRN
Start: 2023-03-30 — End: 2023-05-13
  Filled 2023-03-30 – 2023-03-31 (×2): qty 180, 30d supply, fill #0

## 2023-03-30 NOTE — Telephone Encounter (Signed)
Pt called requesting a refill on his Oxycodone.  Pt stated Dr. Mosetta Putt refilled the Oxycodone on 03/26/2023 but the pharmacy that the prescription was sent is out of stock on the medication.  Pt stated his wife will be in [place], [place] on 03/31/2023 and would like the prescription sent to [facility] who does have the Oxycodone in stock.  Notified Dr. Mosetta Putt of the pt's request.

## 2023-03-31 ENCOUNTER — Other Ambulatory Visit (HOSPITAL_COMMUNITY): Payer: Self-pay

## 2023-04-05 ENCOUNTER — Other Ambulatory Visit: Payer: Self-pay | Admitting: Hematology

## 2023-04-05 DIAGNOSIS — Z8546 Personal history of malignant neoplasm of prostate: Secondary | ICD-10-CM

## 2023-04-07 ENCOUNTER — Ambulatory Visit: Payer: Medicare Other | Admitting: Internal Medicine

## 2023-04-07 DIAGNOSIS — K5903 Drug induced constipation: Secondary | ICD-10-CM | POA: Diagnosis not present

## 2023-04-07 DIAGNOSIS — E669 Obesity, unspecified: Secondary | ICD-10-CM | POA: Diagnosis not present

## 2023-04-07 DIAGNOSIS — J189 Pneumonia, unspecified organism: Secondary | ICD-10-CM | POA: Diagnosis not present

## 2023-04-07 DIAGNOSIS — J4489 Other specified chronic obstructive pulmonary disease: Secondary | ICD-10-CM | POA: Diagnosis not present

## 2023-04-21 ENCOUNTER — Other Ambulatory Visit: Payer: Self-pay | Admitting: Internal Medicine

## 2023-04-21 DIAGNOSIS — I10 Essential (primary) hypertension: Secondary | ICD-10-CM

## 2023-04-26 DIAGNOSIS — M25562 Pain in left knee: Secondary | ICD-10-CM | POA: Diagnosis not present

## 2023-05-07 DIAGNOSIS — E669 Obesity, unspecified: Secondary | ICD-10-CM | POA: Diagnosis not present

## 2023-05-07 DIAGNOSIS — J189 Pneumonia, unspecified organism: Secondary | ICD-10-CM | POA: Diagnosis not present

## 2023-05-07 DIAGNOSIS — J4489 Other specified chronic obstructive pulmonary disease: Secondary | ICD-10-CM | POA: Diagnosis not present

## 2023-05-07 DIAGNOSIS — K5903 Drug induced constipation: Secondary | ICD-10-CM | POA: Diagnosis not present

## 2023-05-10 ENCOUNTER — Other Ambulatory Visit: Payer: Self-pay | Admitting: Internal Medicine

## 2023-05-10 ENCOUNTER — Other Ambulatory Visit: Payer: Self-pay | Admitting: Nurse Practitioner

## 2023-05-10 DIAGNOSIS — Z0000 Encounter for general adult medical examination without abnormal findings: Secondary | ICD-10-CM

## 2023-05-10 DIAGNOSIS — J301 Allergic rhinitis due to pollen: Secondary | ICD-10-CM

## 2023-05-10 DIAGNOSIS — K5903 Drug induced constipation: Secondary | ICD-10-CM

## 2023-05-10 DIAGNOSIS — I10 Essential (primary) hypertension: Secondary | ICD-10-CM

## 2023-05-10 DIAGNOSIS — Z9109 Other allergy status, other than to drugs and biological substances: Secondary | ICD-10-CM

## 2023-05-12 NOTE — Assessment & Plan Note (Addendum)
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  -He developed castration resistant in 2018.  His PSA was up to 11.3 at that time.  -He is status post T10, T12, kyphoplasty (osteoprosis related) completed on January 22 of 2020.  -he has been on Zytiga 1000 mg daily with prednisone at 5 mg daily started in 2018, Eligard 22.5 mg every 3 months and Prolia 60 mg every 6 months.  -Tolerating treatment well. His PSA has been slowly trending up with latest 2.2 in July 2024, with doubling time more than 6 months.  Will continue current therapy.

## 2023-05-13 ENCOUNTER — Inpatient Hospital Stay: Payer: Medicare Other | Attending: Hematology

## 2023-05-13 ENCOUNTER — Telehealth: Payer: Self-pay | Admitting: Pulmonary Disease

## 2023-05-13 ENCOUNTER — Inpatient Hospital Stay: Payer: Medicare Other | Admitting: Hematology

## 2023-05-13 ENCOUNTER — Other Ambulatory Visit (HOSPITAL_COMMUNITY): Payer: Self-pay

## 2023-05-13 ENCOUNTER — Inpatient Hospital Stay: Payer: Medicare Other

## 2023-05-13 VITALS — BP 129/92 | HR 85 | Temp 98.2°F | Resp 18 | Ht 67.0 in | Wt 219.6 lb

## 2023-05-13 DIAGNOSIS — Z79899 Other long term (current) drug therapy: Secondary | ICD-10-CM | POA: Diagnosis not present

## 2023-05-13 DIAGNOSIS — C61 Malignant neoplasm of prostate: Secondary | ICD-10-CM

## 2023-05-13 DIAGNOSIS — C7951 Secondary malignant neoplasm of bone: Secondary | ICD-10-CM

## 2023-05-13 DIAGNOSIS — J4521 Mild intermittent asthma with (acute) exacerbation: Secondary | ICD-10-CM

## 2023-05-13 DIAGNOSIS — C7952 Secondary malignant neoplasm of bone marrow: Secondary | ICD-10-CM | POA: Diagnosis not present

## 2023-05-13 DIAGNOSIS — Z5111 Encounter for antineoplastic chemotherapy: Secondary | ICD-10-CM | POA: Diagnosis not present

## 2023-05-13 LAB — CMP (CANCER CENTER ONLY)
ALT: 12 U/L (ref 0–44)
AST: 12 U/L — ABNORMAL LOW (ref 15–41)
Albumin: 4 g/dL (ref 3.5–5.0)
Alkaline Phosphatase: 47 U/L (ref 38–126)
Anion gap: 8 (ref 5–15)
BUN: 17 mg/dL (ref 8–23)
CO2: 24 mmol/L (ref 22–32)
Calcium: 9.8 mg/dL (ref 8.9–10.3)
Chloride: 101 mmol/L (ref 98–111)
Creatinine: 0.81 mg/dL (ref 0.61–1.24)
GFR, Estimated: >60 mL/min (ref >60)
Glucose, Bld: 114 mg/dL — ABNORMAL HIGH (ref 70–99)
Potassium: 4.2 mmol/L (ref 3.5–5.1)
Sodium: 133 mmol/L — ABNORMAL LOW (ref 135–145)
Total Bilirubin: 0.5 mg/dL (ref 0.3–1.2)
Total Protein: 6.5 g/dL (ref 6.5–8.1)

## 2023-05-13 LAB — CBC WITH DIFFERENTIAL (CANCER CENTER ONLY)
Abs Immature Granulocytes: 0.07 10*3/uL (ref 0.00–0.07)
Basophils Absolute: 0 10*3/uL (ref 0.0–0.1)
Basophils Relative: 0 %
Eosinophils Absolute: 0.1 10*3/uL (ref 0.0–0.5)
Eosinophils Relative: 1 %
HCT: 37.6 % — ABNORMAL LOW (ref 39.0–52.0)
Hemoglobin: 12.7 g/dL — ABNORMAL LOW (ref 13.0–17.0)
Immature Granulocytes: 1 %
Lymphocytes Relative: 19 %
Lymphs Abs: 2.1 10*3/uL (ref 0.7–4.0)
MCH: 29 pg (ref 26.0–34.0)
MCHC: 33.8 g/dL (ref 30.0–36.0)
MCV: 85.8 fL (ref 80.0–100.0)
Monocytes Absolute: 0.9 10*3/uL (ref 0.1–1.0)
Monocytes Relative: 8 %
Neutro Abs: 7.7 10*3/uL (ref 1.7–7.7)
Neutrophils Relative %: 71 %
Platelet Count: 307 10*3/uL (ref 150–400)
RBC: 4.38 MIL/uL (ref 4.22–5.81)
RDW: 13.5 % (ref 11.5–15.5)
WBC Count: 11 10*3/uL — ABNORMAL HIGH (ref 4.0–10.5)
nRBC: 0 % (ref 0.0–0.2)

## 2023-05-13 MED ORDER — LEUPROLIDE ACETATE (3 MONTH) 22.5 MG ~~LOC~~ KIT
22.5000 mg | PACK | Freq: Once | SUBCUTANEOUS | Status: AC
  Administered 2023-05-13: 22.5 mg via SUBCUTANEOUS
  Filled 2023-05-13: qty 22.5

## 2023-05-13 MED ORDER — OXYCODONE HCL 5 MG PO TABS
5.0000 mg | ORAL_TABLET | ORAL | 0 refills | Status: DC | PRN
  Filled 2023-05-13: qty 180, 30d supply, fill #0

## 2023-05-13 MED ORDER — ALBUTEROL SULFATE (2.5 MG/3ML) 0.083% IN NEBU: 2.5000 mg | INHALATION_SOLUTION | Freq: Four times a day (QID) | RESPIRATORY_TRACT | 2 refills | Status: DC | PRN

## 2023-05-13 NOTE — Telephone Encounter (Signed)
Asher Muir states patient needs refill for Albuterol solution. Pharmacy is [place]. Patient scheduled 08/11/2023 with Dr. Tonia Brooms. Asher Muir phone number is [phone].

## 2023-05-13 NOTE — Telephone Encounter (Signed)
3 months given to make it to appt.

## 2023-05-13 NOTE — Progress Notes (Signed)
[facility]   [phone] [phone]   Clinic Follow up Note   Patient Care Team: Etta Grandchild, MD as PCP - General (Internal Medicine) Kathyrn Sheriff, [facility] (Inactive) as Pharmacist (Pharmacist) Conley Rolls, [place], [place] as Referring Physician (Optometry) [name], MD as Attending Physician (Hematology and Oncology)  Date of Service:  05/13/2023  CHIEF COMPLAINT: f/u of metastatic prostate cancer  CURRENT THERAPY:  Eligard every 3 months Zytiga and prednisone Prolia injection every 6 months  Oncology History   Prostate cancer (HCC) -stage IV with node metastasis, castration resistant, Guardant 360 (-) in 2022 -diagnosed in 2014 with lymphadenopathy, Gleason score 7 and PSA of 23.  -He developed castration resistant in 2018.  His PSA was up to 11.3 at that time.  -He is status post T10, T12, kyphoplasty (osteoprosis related) completed on January 22 of 2020.  -he has been on Zytiga 1000 mg daily with prednisone at 5 mg daily started in 2018, Eligard 22.5 mg every 3 months and Prolia 60 mg every 6 months.  -Tolerating treatment well. His PSA has been slowly trending up with latest 2.2 in July 2024, with doubling time more than 6 months.  Will continue current therapy.   Assessment and Plan    Metastatic Prostate Cancer Stable disease with slowly rising PSA (2.2 from 1.9). No new symptoms related to cancer. Currently on Zytiga and prednisone. -Continue current treatment with Zytiga and prednisone. -Monitor PSA every 3 months.  Recent Fall with Fractures Recovered from tibial plateau fracture and fractured scapula due to a fall. Pain level in back increased after the fall. -Continue current pain management with oxycodone as needed.  Opioid-Induced Constipation Experiencing intermittent constipation despite taking Amitiza.  I also encouraged him to use laxatives.  Overall manageable. -Continue Amitiza and monitor symptoms.  Osteoporosis On  [place] and Prolia for bone health. -Continue Prolia as scheduled.  Plan -Will proceed Eligard injection today -Continue Zytiga and prednisone -I refilled his oxycodone -Lab, follow-up and injection in 3 months     SUMMARY OF ONCOLOGIC HISTORY: Oncology History Overview Note   Cancer Staging  Secondary malignant neoplasm of bone and bone marrow (HCC) Staging form: Bone - Appendicular Skeleton, Trunk, Skull, and Facial Bones, AJCC 8th Edition - Clinical: Stage Unknown (cTX, cNX, pM1) - Signed by Benjiman Core, MD on 12/10/2020     Secondary malignant neoplasm of bone and bone marrow (HCC)  08/06/2018 Initial Diagnosis   Secondary malignant neoplasm of bone and bone marrow (HCC)   12/10/2020 Cancer Staging   Staging form: Bone - Appendicular Skeleton, Trunk, Skull, and Facial Bones, AJCC 8th Edition - Clinical: Stage Unknown (cTX, cNX, pM1) - Signed by Benjiman Core, MD on 12/10/2020   Prostate cancer (HCC)  09/16/2021 Initial Diagnosis   Prostate cancer [facility])      Discussed the use of AI scribe software for clinical note transcription with the patient, who gave verbal consent to proceed.  History of Present Illness   The patient, a 67 year old with a history of metastatic prostate cancer, COPD, and osteoporosis, presents for a routine follow-up. The patient had a recent fall, which resulted in a tibial plateau fracture and a fractured scapula. The fall was unrelated to the cancer and occurred when the patient was trying to avoid a hornet. The fractures have since healed, but the patient reports an increase in back pain since the fall. The patient's pain is managed with oxycodone, which he takes four times a day.  The patient also reports intermittent constipation, which is managed with Amitiza. The patient is currently on multiple medications for his various conditions, including Aligarh, Prodium, Zytiga, Aprazole, Cymbalta, and prednisone. The patient is also trying to increase  his physical activity to lose weight, but his COPD limits his ability to walk long distances.         All other systems were reviewed with the patient and are negative.  MEDICAL HISTORY:  Past Medical History:  Diagnosis Date   Allergic rhinitis    At risk for sleep apnea    STOP-BANG SCORE = 5    (routed to pt's pcp 09-08-2018)   Bladder cancer (HCC) dx'd 2020   Chronic constipation    Chronic low back pain    Chronic pain    DOE (dyspnea on exertion)    History of kidney stones    History of recent pneumonia 08/10/2018   acute bronchopneumonia;  follow-up cxr 08-28-2018 in epic   History of vertebral compression fracture    T10 and T12 s/p kyphoplasty   Hx of colonic polyps    Hyperplasia of prostate with lower urinary tract symptoms (LUTS)    Hypertension    Mild persistent asthma    followed by pcp   Numbness in both legs    secondary to a fall, per pt has had work-up and couldn't find anything   Osteopenia    Prostate cancer metastatic to pelvis [facility]) urologist-- dr borden/  oncologist -- dr Clelia Croft   dx 2014 advanced prostate cancer with pelvid adenopathy,  Stage T2c, Gleason 7;  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   Wears glasses     SURGICAL HISTORY: Past Surgical History:  Procedure Laterality Date   COLONOSCOPY     IR KYPHO EA ADDL LEVEL THORACIC OR LUMBAR  08/17/2018   IR KYPHO EA ADDL LEVEL THORACIC OR LUMBAR  08/26/2018   IR KYPHO LUMBAR INC FX REDUCE BONE BX UNI/BIL CANNULATION INC/IMAGING  08/26/2018   IR KYPHO THORACIC WITH BONE BIOPSY  08/17/2018   IR RADIOLOGIST EVAL & MGMT  08/11/2018   IR RADIOLOGIST EVAL & MGMT  08/24/2018   LYMPHADENECTOMY Left 01/05/2013   Procedure: ROBOTIC LYMPHADENECTOMY;  Surgeon: Crecencio Mc, MD;  Location: [facility];  Service: Urology;  Laterality: Left;   PROSTATE BIOPSY     11/12 positive biopsies   ROTATOR CUFF REPAIR Left 2012   TRANSURETHRAL RESECTION OF BLADDER TUMOR N/A 09/15/2018   Procedure: TRANSURETHRAL RESECTION OF BLADDER TUMOR  (TURBT)WITH CYSTOSCOPY/ POST OPERATIVE INSTILLATION OF GEMCITABINE;  Surgeon: Heloise Purpura, MD;  Location: [facility];  Service: Urology;  Laterality: N/A;  GENERAL ANESTHESIA WITH PARALYSIS   UMBILICAL HERNIA REPAIR  09-02-2001   dr t. price @[facility]     I have reviewed the social history and family history with the patient and they are unchanged from previous note.  ALLERGIES:  is allergic to amlodipine and tetracycline.  MEDICATIONS:  Current Outpatient Medications  Medication Sig Dispense Refill   abiraterone acetate (ZYTIGA) 250 MG tablet Take 4 tablets (1,000 mg total) by mouth daily. 120 tablet 5   acetaminophen (TYLENOL) 325 MG tablet Take 325 mg by mouth daily as needed for fever.     albuterol (PROVENTIL) (2.5 MG/3ML) 0.083% nebulizer solution Take 3 mLs (2.5 mg total) by nebulization every 6 (six) hours as needed for wheezing or shortness of breath. 360 mL 11   albuterol (VENTOLIN HFA) 108 (90 Base) MCG/ACT inhaler INHALE 2 PUFFS EVERY 6 HOURS AS NEEDED FOR WHEEZING OR  SHORTNESS OF BREATH 18 g 3   azelastine (ASTELIN) 0.1 % nasal spray USE 2 SPRAYS IN EACH NOSTRIL EVERY MORNING AND EVERY EVENING 90 mL 1   CALCIUM-VITAMIN D PO Take 1 tablet by mouth 2 (two) times daily.      carvedilol (COREG) 3.125 MG tablet TAKE ONE TABLET TWICE DAILY WITH FOOD 180 tablet 0   denosumab (PROLIA) 60 MG/ML SOSY injection Inject 60 mg into the skin every 6 (six) months.     DULoxetine (CYMBALTA) 20 MG capsule TAKE ONE CAPSULE DAILY 30 capsule 1   Famotidine (PEPCID AC PO) Take 1 tablet by mouth at bedtime.     fluticasone (FLONASE) 50 MCG/ACT nasal spray USE 2 SPRAYS IN EACH NOSTRIL ONCE DAILY 48 g 1   Fluticasone-Umeclidin-Vilant (TRELEGY ELLIPTA) 100-62.5-25 MCG/ACT AEPB Inhale 1 puff into the lungs daily. 120 each 3   furosemide (LASIX) 20 MG tablet TAKE ONE TABLET TWICE DAILY 180 tablet 0   leuprolide (LUPRON) 11.25 MG injection Inject 11.25 mg into the muscle every 3 (three) months.     lubiprostone  (AMITIZA) 24 MCG capsule TAKE ONE CAPSULE TWICE DAILY WITH MEAL(S) 180 capsule 0   meclizine (ANTIVERT) 25 MG tablet Take 1 tablet (25 mg total) by mouth 3 (three) times daily as needed for dizziness. 270 tablet 0   Multiple Vitamins-Minerals (CENTRUM SILVER 50+MEN PO) Take 1 tablet by mouth daily.     NARCAN 4 MG/0.1ML LIQD nasal spray kit Place 0.4 mg into the nose once as needed (overdose).      olmesartan (BENICAR) 20 MG tablet Take 1 tablet (20 mg total) by mouth daily. 90 tablet 1   ondansetron (ZOFRAN) 4 MG tablet Take 1 tablet (4 mg total) by mouth every 6 (six) hours as needed for nausea. 20 tablet 0   oxyCODONE (OXY IR/ROXICODONE) 5 MG immediate release tablet Take 1 tablet (5 mg) by mouth every 4 hours as needed for severe pain. 180 tablet 0   polyethylene glycol powder (GLYCOLAX/MIRALAX) 17 GM/SCOOP powder Take 17 g (1 capful) by mouth daily as directed. 510 g 6   predniSONE (DELTASONE) 5 MG tablet TAKE 1 TABLET 2 TIMES A DAY 90 tablet 3   sodium chloride 1 g tablet Take 1 g by mouth 2 (two) times daily with a meal.     solifenacin (VESICARE) 5 MG tablet Take 5 mg by mouth daily.     Specialty Vitamins Products (VISTA ADVANCED CAROTENOID) CAPS Take 1 capsule by mouth every evening.     vitamin C (ASCORBIC ACID) 500 MG tablet Take 500 mg by mouth every evening.     vitamin E 180 MG (400 UNITS) capsule Take 400 Units by mouth every evening.     No current facility-administered medications for this visit.   Facility-Administered Medications Ordered in Other Visits  Medication Dose Route Frequency Provider Last Rate Last Admin   Leuprolide Acetate (3 Month) (ELIGARD) 22.5 MG injection 22.5 mg  22.5 mg Subcutaneous Once [name], MD        PHYSICAL EXAMINATION: ECOG PERFORMANCE STATUS: 2 - Symptomatic, <50% confined to bed  Vitals:   05/13/23 0950  BP: (!) 129/92  Pulse: 85  Resp: 18  Temp: 98.2 F (36.8 C)  SpO2: 99%   Wt Readings from Last 3 Encounters:  05/13/23 219 lb  9.6 oz (99.6 kg)  02/11/23 213 lb 11.2 oz (96.9 kg)  12/15/22 212 lb (96.2 kg)     GENERAL:alert, no distress and comfortable SKIN: skin color,  texture, turgor are normal, no rashes or significant lesions EYES: normal, Conjunctiva are pink and non-injected, sclera clear NECK: supple, thyroid normal size, non-tender, without nodularity LYMPH:  no palpable lymphadenopathy in the cervical, axillary  LUNGS: clear to auscultation and percussion with normal breathing effort HEART: regular rate & rhythm and no murmurs and no lower extremity edema ABDOMEN:abdomen soft, non-tender and normal bowel sounds Musculoskeletal:no cyanosis of digits and no clubbing  NEURO: alert & oriented x 3 with fluent speech, no focal motor/sensory deficits   LABORATORY DATA:  I have reviewed the data as listed    Latest Ref Rng & Units 05/13/2023    9:38 AM 02/11/2023   11:34 AM 11/12/2022   11:07 AM  CBC  WBC 4.0 - 10.5 K/uL 11.0  10.5  9.9   Hemoglobin 13.0 - 17.0 g/dL 16.1  09.6  04.5   Hematocrit 39.0 - 52.0 % 37.6  37.5  35.4   Platelets 150 - 400 K/uL 307  294  304         Latest Ref Rng & Units 05/13/2023    9:38 AM 02/11/2023   11:34 AM 12/01/2022   10:51 AM  CMP  Glucose 70 - 99 mg/dL 409  811  914   BUN 8 - 23 mg/dL 17  13  16    Creatinine 0.61 - 1.24 mg/dL 7.82  9.56  2.13   Sodium 135 - 145 mmol/L 133  138  133   Potassium 3.5 - 5.1 mmol/L 4.2  3.7  3.9   Chloride 98 - 111 mmol/L 101  108  100   CO2 22 - 32 mmol/L 24  24  24    Calcium 8.9 - 10.3 mg/dL 9.8  9.6  9.2   Total Protein 6.5 - 8.1 g/dL 6.5  6.4    Total Bilirubin 0.3 - 1.2 mg/dL 0.5  0.5    Alkaline Phos 38 - 126 U/L 47  50    AST 15 - 41 U/L 12  12    ALT 0 - 44 U/L 12  12        RADIOGRAPHIC STUDIES: I have personally reviewed the radiological images as listed and agreed with the findings in the report. No results found.    No orders of the defined types were placed in this encounter.  All questions were answered.  The patient knows to call the clinic with any problems, questions or concerns. No barriers to learning was detected. The total time spent in the appointment was 25 minutes.     [name], MD 05/13/2023

## 2023-05-14 LAB — PROSTATE-SPECIFIC AG, SERUM (LABCORP): Prostate Specific Ag, Serum: 2 ng/mL (ref 0.0–4.0)

## 2023-05-17 ENCOUNTER — Ambulatory Visit: Payer: Medicare Other | Admitting: Internal Medicine

## 2023-05-17 ENCOUNTER — Encounter: Payer: Self-pay | Admitting: Internal Medicine

## 2023-05-17 VITALS — BP 134/82 | HR 82 | Temp 97.6°F | Resp 16 | Ht 67.0 in | Wt 218.0 lb

## 2023-05-17 DIAGNOSIS — R42 Dizziness and giddiness: Secondary | ICD-10-CM

## 2023-05-17 DIAGNOSIS — E785 Hyperlipidemia, unspecified: Secondary | ICD-10-CM

## 2023-05-17 DIAGNOSIS — I10 Essential (primary) hypertension: Secondary | ICD-10-CM | POA: Diagnosis not present

## 2023-05-17 LAB — LIPID PANEL
Cholesterol: 191 mg/dL (ref 0–200)
HDL: 44.2 mg/dL (ref >39.00)
LDL Cholesterol: 115 mg/dL — ABNORMAL HIGH (ref 0–99)
NonHDL: 146.57
Total CHOL/HDL Ratio: 4
Triglycerides: 157 mg/dL — ABNORMAL HIGH (ref 0.0–149.0)
VLDL: 31.4 mg/dL (ref 0.0–40.0)

## 2023-05-17 MED ORDER — MECLIZINE HCL 12.5 MG PO TABS: 12.5000 mg | ORAL_TABLET | Freq: Three times a day (TID) | ORAL | 0 refills | Status: AC | PRN

## 2023-05-17 NOTE — Patient Instructions (Signed)
Hypertension, Adult High blood pressure (hypertension) is when the force of blood pumping through the arteries is too strong. The arteries are the blood vessels that carry blood from the heart throughout the body. Hypertension forces the heart to work harder to pump blood and may cause arteries to become narrow or stiff. Untreated or uncontrolled hypertension can lead to a heart attack, heart failure, a stroke, kidney disease, and other problems. A blood pressure reading consists of a higher number over a lower number. Ideally, your blood pressure should be below 120/80. The first ("top") number is called the systolic pressure. It is a measure of the pressure in your arteries as your heart beats. The second ("bottom") number is called the diastolic pressure. It is a measure of the pressure in your arteries as the heart relaxes. What are the causes? The exact cause of this condition is not known. There are some conditions that result in high blood pressure. What increases the risk? Certain factors may make you more likely to develop high blood pressure. Some of these risk factors are under your control, including: Smoking. Not getting enough exercise or physical activity. Being overweight. Having too much fat, sugar, calories, or salt (sodium) in your diet. Drinking too much alcohol. Other risk factors include: Having a personal history of heart disease, diabetes, high cholesterol, or kidney disease. Stress. Having a family history of high blood pressure and high cholesterol. Having obstructive sleep apnea. Age. The risk increases with age. What are the signs or symptoms? High blood pressure may not cause symptoms. Very high blood pressure (hypertensive crisis) may cause: Headache. Fast or irregular heartbeats (palpitations). Shortness of breath. Nosebleed. Nausea and vomiting. Vision changes. Severe chest pain, dizziness, and seizures. How is this diagnosed? This condition is diagnosed by  measuring your blood pressure while you are seated, with your arm resting on a flat surface, your legs uncrossed, and your feet flat on the floor. The cuff of the blood pressure monitor will be placed directly against the skin of your upper arm at the level of your heart. Blood pressure should be measured at least twice using the same arm. Certain conditions can cause a difference in blood pressure between your right and left arms. If you have a high blood pressure reading during one visit or you have normal blood pressure with other risk factors, you may be asked to: Return on a different day to have your blood pressure checked again. Monitor your blood pressure at home for 1 week or longer. If you are diagnosed with hypertension, you may have other blood or imaging tests to help your health care provider understand your overall risk for other conditions. How is this treated? This condition is treated by making healthy lifestyle changes, such as eating healthy foods, exercising more, and reducing your alcohol intake. You may be referred for counseling on a healthy diet and physical activity. Your health care provider may prescribe medicine if lifestyle changes are not enough to get your blood pressure under control and if: Your systolic blood pressure is above 130. Your diastolic blood pressure is above 80. Your personal target blood pressure may vary depending on your medical conditions, your age, and other factors. Follow these instructions at home: Eating and drinking  Eat a diet that is high in fiber and potassium, and low in sodium, added sugar, and fat. An example of this eating plan is called the DASH diet. DASH stands for Dietary Approaches to Stop Hypertension. To eat this way: Eat   plenty of fresh fruits and vegetables. Try to fill one half of your plate at each meal with fruits and vegetables. Eat whole grains, such as whole-wheat pasta, brown rice, or whole-grain bread. Fill about one  fourth of your plate with whole grains. Eat or drink low-fat dairy products, such as skim milk or low-fat yogurt. Avoid fatty cuts of meat, processed or cured meats, and poultry with skin. Fill about one fourth of your plate with lean proteins, such as fish, chicken without skin, beans, eggs, or tofu. Avoid pre-made and processed foods. These tend to be higher in sodium, added sugar, and fat. Reduce your daily sodium intake. Many people with hypertension should eat less than 1,500 mg of sodium a day. Do not drink alcohol if: Your health care provider tells you not to drink. You are pregnant, may be pregnant, or are planning to become pregnant. If you drink alcohol: Limit how much you have to: 0-1 drink a day for women. 0-2 drinks a day for men. Know how much alcohol is in your drink. In the [place] one drink equals one 12 oz bottle of beer (355 mL), one 5 oz glass of wine (148 mL), or one 1 oz glass of hard liquor (44 mL). Lifestyle  Work with your health care provider to maintain a healthy body weight or to lose weight. Ask what an ideal weight is for you. Get at least 30 minutes of exercise that causes your heart to beat faster (aerobic exercise) most days of the week. Activities may include walking, swimming, or biking. Include exercise to strengthen your muscles (resistance exercise), such as Pilates or lifting weights, as part of your weekly exercise routine. Try to do these types of exercises for 30 minutes at least 3 days a week. Do not use any products that contain nicotine or tobacco. These products include cigarettes, chewing tobacco, and vaping devices, such as e-cigarettes. If you need help quitting, ask your health care provider. Monitor your blood pressure at home as told by your health care provider. Keep all follow-up visits. This is important. Medicines Take over-the-counter and prescription medicines only as told by your health care provider. Follow directions carefully. Blood  pressure medicines must be taken as prescribed. Do not skip doses of blood pressure medicine. Doing this puts you at risk for problems and can make the medicine less effective. Ask your health care provider about side effects or reactions to medicines that you should watch for. Contact a health care provider if you: Think you are having a reaction to a medicine you are taking. Have headaches that keep coming back (recurring). Feel dizzy. Have swelling in your ankles. Have trouble with your vision. Get help right away if you: Develop a severe headache or confusion. Have unusual weakness or numbness. Feel faint. Have severe pain in your chest or abdomen. Vomit repeatedly. Have trouble breathing. These symptoms may be an emergency. Get help right away. Call 911. Do not wait to see if the symptoms will go away. Do not drive yourself to the hospital. Summary Hypertension is when the force of blood pumping through your arteries is too strong. If this condition is not controlled, it may put you at risk for serious complications. Your personal target blood pressure may vary depending on your medical conditions, your age, and other factors. For most people, a normal blood pressure is less than 120/80. Hypertension is treated with lifestyle changes, medicines, or a combination of both. Lifestyle changes include losing weight, eating a healthy,   low-sodium diet, exercising more, and limiting alcohol. This information is not intended to replace advice given to you by your health care provider. Make sure you discuss any questions you have with your health care provider. Document Revised: 05/20/2021 Document Reviewed: 05/20/2021 Elsevier Patient Education  2024 [employer]  

## 2023-05-17 NOTE — Progress Notes (Signed)
CL 101 05/13/2023   CREATININE 0.81 05/13/2023   BUN 17 05/13/2023   CO2 24 05/13/2023   TSH 1.025 09/06/2022   PSA 1.9 11/12/2022   INR 1.1 09/17/2021   HGBA1C 5.6 12/01/2022    ECHOCARDIOGRAM COMPLETE  Result Date: 09/04/2022    ECHOCARDIOGRAM REPORT   Patient Name:   [name] Holder Date of Exam: 09/04/2022 Medical Rec #:  [id number]         Height:       67.0 in Accession #:    [id number]        Weight:       228.0 lb Date of Birth:  Dec 17, 1955         BSA:          2.138 m Patient Age:    66 years          BP:           130/70 mmHg Patient Gender: M                 HR:           98 bpm. Exam Location:  Inpatient Procedure: 2D Echo, Cardiac Doppler, Color Doppler and Intracardiac            Opacification Agent Indications:    R55 Syncope  History:        Patient has no prior history of Echocardiogram examinations.                 COPD, Signs/Symptoms:Syncope; Risk Factors:Former Smoker,                 Dyslipidemia and Hypertension.  Sonographer:    Dondra Prader RVT RCS Referring Phys: [phone] [facility]  Sonographer Comments: Technically difficult study due to poor echo windows, suboptimal parasternal window, suboptimal apical window and suboptimal subcostal window. IMPRESSIONS  1. Left ventricular ejection fraction, by estimation, is 65 to 70%. The left ventricle has normal function. The left ventricle has no regional wall motion abnormalities. There is mild left ventricular hypertrophy. Left ventricular diastolic parameters are consistent with Grade I diastolic dysfunction (impaired relaxation).  2. Right ventricular systolic function is normal. The right ventricular size is normal. Tricuspid regurgitation signal is inadequate for assessing PA pressure.  3. The mitral valve is normal in structure. No  evidence of mitral valve regurgitation. No evidence of mitral stenosis.  4. The aortic valve was not well visualized. Aortic valve regurgitation is not visualized. No aortic stenosis is present.  5. Aortic dilatation noted. There is mild dilatation of the ascending aorta, measuring 38 mm.  6. The inferior vena cava is dilated in size with >50% respiratory variability, suggesting right atrial pressure of 8 mmHg. FINDINGS  Left Ventricle: Left ventricular ejection fraction, by estimation, is 65 to 70%. The left ventricle has normal function. The left ventricle has no regional wall motion abnormalities. Definity contrast agent was given IV to delineate the left ventricular  endocardial borders. The left ventricular internal cavity size was normal in size. There is mild left ventricular hypertrophy. Left ventricular diastolic parameters are consistent with Grade I diastolic dysfunction (impaired relaxation). Right Ventricle: The right ventricular size is normal. No increase in right ventricular wall thickness. Right ventricular systolic function is normal. Tricuspid regurgitation signal is inadequate for assessing PA pressure. Left Atrium: Left atrial size was normal in size. Right Atrium: Right atrial size was normal in size. Pericardium: There is no evidence of pericardial effusion. Presence of epicardial fat  CL 101 05/13/2023   CREATININE 0.81 05/13/2023   BUN 17 05/13/2023   CO2 24 05/13/2023   TSH 1.025 09/06/2022   PSA 1.9 11/12/2022   INR 1.1 09/17/2021   HGBA1C 5.6 12/01/2022    ECHOCARDIOGRAM COMPLETE  Result Date: 09/04/2022    ECHOCARDIOGRAM REPORT   Patient Name:   [name] Holder Date of Exam: 09/04/2022 Medical Rec #:  [id number]         Height:       67.0 in Accession #:    [id number]        Weight:       228.0 lb Date of Birth:  Dec 17, 1955         BSA:          2.138 m Patient Age:    66 years          BP:           130/70 mmHg Patient Gender: M                 HR:           98 bpm. Exam Location:  Inpatient Procedure: 2D Echo, Cardiac Doppler, Color Doppler and Intracardiac            Opacification Agent Indications:    R55 Syncope  History:        Patient has no prior history of Echocardiogram examinations.                 COPD, Signs/Symptoms:Syncope; Risk Factors:Former Smoker,                 Dyslipidemia and Hypertension.  Sonographer:    Dondra Prader RVT RCS Referring Phys: [phone] [facility]  Sonographer Comments: Technically difficult study due to poor echo windows, suboptimal parasternal window, suboptimal apical window and suboptimal subcostal window. IMPRESSIONS  1. Left ventricular ejection fraction, by estimation, is 65 to 70%. The left ventricle has normal function. The left ventricle has no regional wall motion abnormalities. There is mild left ventricular hypertrophy. Left ventricular diastolic parameters are consistent with Grade I diastolic dysfunction (impaired relaxation).  2. Right ventricular systolic function is normal. The right ventricular size is normal. Tricuspid regurgitation signal is inadequate for assessing PA pressure.  3. The mitral valve is normal in structure. No  evidence of mitral valve regurgitation. No evidence of mitral stenosis.  4. The aortic valve was not well visualized. Aortic valve regurgitation is not visualized. No aortic stenosis is present.  5. Aortic dilatation noted. There is mild dilatation of the ascending aorta, measuring 38 mm.  6. The inferior vena cava is dilated in size with >50% respiratory variability, suggesting right atrial pressure of 8 mmHg. FINDINGS  Left Ventricle: Left ventricular ejection fraction, by estimation, is 65 to 70%. The left ventricle has normal function. The left ventricle has no regional wall motion abnormalities. Definity contrast agent was given IV to delineate the left ventricular  endocardial borders. The left ventricular internal cavity size was normal in size. There is mild left ventricular hypertrophy. Left ventricular diastolic parameters are consistent with Grade I diastolic dysfunction (impaired relaxation). Right Ventricle: The right ventricular size is normal. No increase in right ventricular wall thickness. Right ventricular systolic function is normal. Tricuspid regurgitation signal is inadequate for assessing PA pressure. Left Atrium: Left atrial size was normal in size. Right Atrium: Right atrial size was normal in size. Pericardium: There is no evidence of pericardial effusion. Presence of epicardial fat  CL 101 05/13/2023   CREATININE 0.81 05/13/2023   BUN 17 05/13/2023   CO2 24 05/13/2023   TSH 1.025 09/06/2022   PSA 1.9 11/12/2022   INR 1.1 09/17/2021   HGBA1C 5.6 12/01/2022    ECHOCARDIOGRAM COMPLETE  Result Date: 09/04/2022    ECHOCARDIOGRAM REPORT   Patient Name:   [name] Holder Date of Exam: 09/04/2022 Medical Rec #:  [id number]         Height:       67.0 in Accession #:    [id number]        Weight:       228.0 lb Date of Birth:  Dec 17, 1955         BSA:          2.138 m Patient Age:    66 years          BP:           130/70 mmHg Patient Gender: M                 HR:           98 bpm. Exam Location:  Inpatient Procedure: 2D Echo, Cardiac Doppler, Color Doppler and Intracardiac            Opacification Agent Indications:    R55 Syncope  History:        Patient has no prior history of Echocardiogram examinations.                 COPD, Signs/Symptoms:Syncope; Risk Factors:Former Smoker,                 Dyslipidemia and Hypertension.  Sonographer:    Dondra Prader RVT RCS Referring Phys: [phone] [facility]  Sonographer Comments: Technically difficult study due to poor echo windows, suboptimal parasternal window, suboptimal apical window and suboptimal subcostal window. IMPRESSIONS  1. Left ventricular ejection fraction, by estimation, is 65 to 70%. The left ventricle has normal function. The left ventricle has no regional wall motion abnormalities. There is mild left ventricular hypertrophy. Left ventricular diastolic parameters are consistent with Grade I diastolic dysfunction (impaired relaxation).  2. Right ventricular systolic function is normal. The right ventricular size is normal. Tricuspid regurgitation signal is inadequate for assessing PA pressure.  3. The mitral valve is normal in structure. No  evidence of mitral valve regurgitation. No evidence of mitral stenosis.  4. The aortic valve was not well visualized. Aortic valve regurgitation is not visualized. No aortic stenosis is present.  5. Aortic dilatation noted. There is mild dilatation of the ascending aorta, measuring 38 mm.  6. The inferior vena cava is dilated in size with >50% respiratory variability, suggesting right atrial pressure of 8 mmHg. FINDINGS  Left Ventricle: Left ventricular ejection fraction, by estimation, is 65 to 70%. The left ventricle has normal function. The left ventricle has no regional wall motion abnormalities. Definity contrast agent was given IV to delineate the left ventricular  endocardial borders. The left ventricular internal cavity size was normal in size. There is mild left ventricular hypertrophy. Left ventricular diastolic parameters are consistent with Grade I diastolic dysfunction (impaired relaxation). Right Ventricle: The right ventricular size is normal. No increase in right ventricular wall thickness. Right ventricular systolic function is normal. Tricuspid regurgitation signal is inadequate for assessing PA pressure. Left Atrium: Left atrial size was normal in size. Right Atrium: Right atrial size was normal in size. Pericardium: There is no evidence of pericardial effusion. Presence of epicardial fat  Subjective:  Patient ID: CONFESOR CAPEL, male    DOB: [birth year]  Age: 67 y.o. MRN: [id number]  CC: Hypertension and Anemia   HPI DAVEN MICHALKO presents for f/up -----  He denies CP, edema, cough.       Outpatient Medications Prior to Visit  Medication Sig Dispense Refill   abiraterone acetate (ZYTIGA) 250 MG tablet Take 4 tablets (1,000 mg total) by mouth daily. 120 tablet 5   acetaminophen (TYLENOL) 325 MG tablet Take 325 mg by mouth daily as needed for fever.     albuterol (PROVENTIL) (2.5 MG/3ML) 0.083% nebulizer solution Take 3 mLs (2.5 mg total) by nebulization every 6 (six) hours as needed for wheezing or shortness of breath. 360 mL 2   albuterol (VENTOLIN HFA) 108 (90 Base) MCG/ACT inhaler INHALE 2 PUFFS EVERY 6 HOURS AS NEEDED FOR WHEEZING OR SHORTNESS OF BREATH 18 g 3   azelastine (ASTELIN) 0.1 % nasal spray USE 2 SPRAYS IN EACH NOSTRIL EVERY MORNING AND EVERY EVENING 90 mL 1   CALCIUM-VITAMIN D PO Take 1 tablet by mouth 2 (two) times daily.      carvedilol (COREG) 3.125 MG tablet TAKE ONE TABLET TWICE DAILY WITH FOOD 180 tablet 0   denosumab (PROLIA) 60 MG/ML SOSY injection Inject 60 mg into the skin every 6 (six) months.     DULoxetine (CYMBALTA) 20 MG capsule TAKE ONE CAPSULE DAILY 30 capsule 1   Famotidine (PEPCID AC PO) Take 1 tablet by mouth at bedtime.     fluticasone (FLONASE) 50 MCG/ACT nasal spray USE 2 SPRAYS IN EACH NOSTRIL ONCE DAILY 48 g 1   furosemide (LASIX) 20 MG tablet TAKE ONE TABLET TWICE DAILY 180 tablet 0   leuprolide (LUPRON) 11.25 MG injection Inject 11.25 mg into the muscle every 3 (three) months.     lubiprostone (AMITIZA) 24 MCG capsule TAKE ONE CAPSULE TWICE DAILY WITH MEAL(S) 180 capsule 0   Multiple Vitamins-Minerals (CENTRUM SILVER 50+MEN PO) Take 1 tablet by mouth daily.     NARCAN 4 MG/0.1ML LIQD nasal spray kit Place 0.4 mg into the nose once as needed (overdose).      olmesartan (BENICAR) 20 MG tablet Take 1 tablet (20 mg total) by  mouth daily. 90 tablet 1   oxyCODONE (OXY IR/ROXICODONE) 5 MG immediate release tablet Take 1 tablet (5 mg) by mouth every 4 hours as needed for severe pain. 180 tablet 0   predniSONE (DELTASONE) 5 MG tablet TAKE 1 TABLET 2 TIMES A DAY 90 tablet 3   sodium chloride 1 g tablet Take 1 g by mouth 2 (two) times daily with a meal.     solifenacin (VESICARE) 5 MG tablet Take 5 mg by mouth daily.     Specialty Vitamins Products (VISTA ADVANCED CAROTENOID) CAPS Take 1 capsule by mouth every evening.     vitamin C (ASCORBIC ACID) 500 MG tablet Take 500 mg by mouth every evening.     vitamin E 180 MG (400 UNITS) capsule Take 400 Units by mouth every evening.     Fluticasone-Umeclidin-Vilant (TRELEGY ELLIPTA) 100-62.5-25 MCG/ACT AEPB Inhale 1 puff into the lungs daily. 120 each 3   meclizine (ANTIVERT) 25 MG tablet Take 1 tablet (25 mg total) by mouth 3 (three) times daily as needed for dizziness. 270 tablet 0   ondansetron (ZOFRAN) 4 MG tablet Take 1 tablet (4 mg total) by mouth every 6 (six) hours as needed for nausea. (Patient not taking: Reported on 05/17/2023) 20 tablet 0  Subjective:  Patient ID: CONFESOR CAPEL, male    DOB: [birth year]  Age: 67 y.o. MRN: [id number]  CC: Hypertension and Anemia   HPI DAVEN MICHALKO presents for f/up -----  He denies CP, edema, cough.       Outpatient Medications Prior to Visit  Medication Sig Dispense Refill   abiraterone acetate (ZYTIGA) 250 MG tablet Take 4 tablets (1,000 mg total) by mouth daily. 120 tablet 5   acetaminophen (TYLENOL) 325 MG tablet Take 325 mg by mouth daily as needed for fever.     albuterol (PROVENTIL) (2.5 MG/3ML) 0.083% nebulizer solution Take 3 mLs (2.5 mg total) by nebulization every 6 (six) hours as needed for wheezing or shortness of breath. 360 mL 2   albuterol (VENTOLIN HFA) 108 (90 Base) MCG/ACT inhaler INHALE 2 PUFFS EVERY 6 HOURS AS NEEDED FOR WHEEZING OR SHORTNESS OF BREATH 18 g 3   azelastine (ASTELIN) 0.1 % nasal spray USE 2 SPRAYS IN EACH NOSTRIL EVERY MORNING AND EVERY EVENING 90 mL 1   CALCIUM-VITAMIN D PO Take 1 tablet by mouth 2 (two) times daily.      carvedilol (COREG) 3.125 MG tablet TAKE ONE TABLET TWICE DAILY WITH FOOD 180 tablet 0   denosumab (PROLIA) 60 MG/ML SOSY injection Inject 60 mg into the skin every 6 (six) months.     DULoxetine (CYMBALTA) 20 MG capsule TAKE ONE CAPSULE DAILY 30 capsule 1   Famotidine (PEPCID AC PO) Take 1 tablet by mouth at bedtime.     fluticasone (FLONASE) 50 MCG/ACT nasal spray USE 2 SPRAYS IN EACH NOSTRIL ONCE DAILY 48 g 1   furosemide (LASIX) 20 MG tablet TAKE ONE TABLET TWICE DAILY 180 tablet 0   leuprolide (LUPRON) 11.25 MG injection Inject 11.25 mg into the muscle every 3 (three) months.     lubiprostone (AMITIZA) 24 MCG capsule TAKE ONE CAPSULE TWICE DAILY WITH MEAL(S) 180 capsule 0   Multiple Vitamins-Minerals (CENTRUM SILVER 50+MEN PO) Take 1 tablet by mouth daily.     NARCAN 4 MG/0.1ML LIQD nasal spray kit Place 0.4 mg into the nose once as needed (overdose).      olmesartan (BENICAR) 20 MG tablet Take 1 tablet (20 mg total) by  mouth daily. 90 tablet 1   oxyCODONE (OXY IR/ROXICODONE) 5 MG immediate release tablet Take 1 tablet (5 mg) by mouth every 4 hours as needed for severe pain. 180 tablet 0   predniSONE (DELTASONE) 5 MG tablet TAKE 1 TABLET 2 TIMES A DAY 90 tablet 3   sodium chloride 1 g tablet Take 1 g by mouth 2 (two) times daily with a meal.     solifenacin (VESICARE) 5 MG tablet Take 5 mg by mouth daily.     Specialty Vitamins Products (VISTA ADVANCED CAROTENOID) CAPS Take 1 capsule by mouth every evening.     vitamin C (ASCORBIC ACID) 500 MG tablet Take 500 mg by mouth every evening.     vitamin E 180 MG (400 UNITS) capsule Take 400 Units by mouth every evening.     Fluticasone-Umeclidin-Vilant (TRELEGY ELLIPTA) 100-62.5-25 MCG/ACT AEPB Inhale 1 puff into the lungs daily. 120 each 3   meclizine (ANTIVERT) 25 MG tablet Take 1 tablet (25 mg total) by mouth 3 (three) times daily as needed for dizziness. 270 tablet 0   ondansetron (ZOFRAN) 4 MG tablet Take 1 tablet (4 mg total) by mouth every 6 (six) hours as needed for nausea. (Patient not taking: Reported on 05/17/2023) 20 tablet 0

## 2023-05-21 ENCOUNTER — Other Ambulatory Visit: Payer: Self-pay | Admitting: Pulmonary Disease

## 2023-05-21 DIAGNOSIS — J4520 Mild intermittent asthma, uncomplicated: Secondary | ICD-10-CM

## 2023-05-24 MED ORDER — ROSUVASTATIN CALCIUM 10 MG PO TABS
10.0000 mg | ORAL_TABLET | Freq: Every day | ORAL | 1 refills | Status: DC
Start: 2023-05-24 — End: 2023-11-02

## 2023-06-02 ENCOUNTER — Other Ambulatory Visit: Payer: Self-pay | Admitting: Internal Medicine

## 2023-06-02 DIAGNOSIS — I10 Essential (primary) hypertension: Secondary | ICD-10-CM

## 2023-06-07 DIAGNOSIS — J189 Pneumonia, unspecified organism: Secondary | ICD-10-CM | POA: Diagnosis not present

## 2023-06-07 DIAGNOSIS — E669 Obesity, unspecified: Secondary | ICD-10-CM | POA: Diagnosis not present

## 2023-06-07 DIAGNOSIS — J4489 Other specified chronic obstructive pulmonary disease: Secondary | ICD-10-CM | POA: Diagnosis not present

## 2023-06-07 DIAGNOSIS — K5903 Drug induced constipation: Secondary | ICD-10-CM | POA: Diagnosis not present

## 2023-06-29 ENCOUNTER — Other Ambulatory Visit (HOSPITAL_COMMUNITY): Payer: Self-pay

## 2023-06-29 ENCOUNTER — Other Ambulatory Visit: Payer: Self-pay | Admitting: Hematology

## 2023-06-29 ENCOUNTER — Other Ambulatory Visit: Payer: Self-pay

## 2023-06-29 ENCOUNTER — Telehealth: Payer: Self-pay

## 2023-06-29 DIAGNOSIS — C7951 Secondary malignant neoplasm of bone: Secondary | ICD-10-CM

## 2023-06-29 MED ORDER — OXYCODONE HCL 5 MG PO TABS
5.0000 mg | ORAL_TABLET | ORAL | 0 refills | Status: DC | PRN
  Filled 2023-06-29: qty 180, 30d supply, fill #0

## 2023-06-29 NOTE — Telephone Encounter (Signed)
Patient called in needing to get a refill on his Oxycodone 5 mg. Patient lives in [place] and will be coming to [place] on Thursday and would like it called into the [facility] to be picked up then.

## 2023-07-01 ENCOUNTER — Other Ambulatory Visit (HOSPITAL_COMMUNITY): Payer: Self-pay

## 2023-07-07 ENCOUNTER — Other Ambulatory Visit: Payer: Self-pay | Admitting: Nurse Practitioner

## 2023-07-07 DIAGNOSIS — K5903 Drug induced constipation: Secondary | ICD-10-CM | POA: Diagnosis not present

## 2023-07-07 DIAGNOSIS — J4489 Other specified chronic obstructive pulmonary disease: Secondary | ICD-10-CM | POA: Diagnosis not present

## 2023-07-07 DIAGNOSIS — E669 Obesity, unspecified: Secondary | ICD-10-CM | POA: Diagnosis not present

## 2023-07-07 DIAGNOSIS — J189 Pneumonia, unspecified organism: Secondary | ICD-10-CM | POA: Diagnosis not present

## 2023-07-09 DIAGNOSIS — H5213 Myopia, bilateral: Secondary | ICD-10-CM | POA: Diagnosis not present

## 2023-07-19 ENCOUNTER — Other Ambulatory Visit: Payer: Self-pay | Admitting: Internal Medicine

## 2023-07-19 DIAGNOSIS — I10 Essential (primary) hypertension: Secondary | ICD-10-CM

## 2023-07-26 ENCOUNTER — Ambulatory Visit (INDEPENDENT_AMBULATORY_CARE_PROVIDER_SITE_OTHER): Payer: Medicare Other

## 2023-07-26 VITALS — Ht 67.0 in | Wt 218.0 lb

## 2023-07-26 DIAGNOSIS — Z0000 Encounter for general adult medical examination without abnormal findings: Secondary | ICD-10-CM

## 2023-07-26 NOTE — Patient Instructions (Addendum)
[name] Holder , Thank you for taking time to come for your Medicare Wellness Visit. I appreciate your ongoing commitment to your health goals. Please review the following plan we discussed and let me know if I can assist you in the future.   Referrals/Orders/Follow-Ups/Clinician Recommendations: You are due for a Tetanus vaccine.  Keep up the good work and have a Happy New Year.  This is a list of the screening recommended for you and due dates:  Health Maintenance  Topic Date Due   DTaP/Tdap/Td vaccine (2 - Td or Tdap) 08/26/2022   Screening for Lung Cancer  05/16/2024*   Medicare Annual Wellness Visit  07/25/2024   Colon Cancer Screening  02/10/2028   Pneumonia Vaccine  Completed   Hepatitis C Screening  Completed   Zoster (Shingles) Vaccine  Completed   HPV Vaccine  Aged Out   Flu Shot  Discontinued   COVID-19 Vaccine  Discontinued  *Topic was postponed. The date shown is not the original due date.    Advanced directives: (Copy Requested) Please bring a copy of your health care power of attorney and living will to the office to be added to your chart at your convenience.  Next Medicare Annual Wellness Visit scheduled for next year: Yes  Managing Pain Without Opioids Opioids are strong medicines used to treat moderate to severe pain. For some people, especially those who have long-term (chronic) pain, opioids may not be the best choice for pain management due to: Side effects like nausea, constipation, and sleepiness. The risk of addiction (opioid use disorder). The longer you take opioids, the greater your risk of addiction. Pain that lasts for more than 3 months is called chronic pain. Managing chronic pain usually requires more than one approach and is often provided by a team of health care providers working together (multidisciplinary approach). Pain management may be done at a pain management center or pain clinic. How to manage pain without the use of opioids Use non-opioid  medicines Non-opioid medicines for pain may include: Over-the-counter or prescription non-steroidal anti-inflammatory drugs (NSAIDs). These may be the first medicines used for pain. They work well for muscle and bone pain, and they reduce swelling. Acetaminophen. This over-the-counter medicine may work well for milder pain but not swelling. Antidepressants. These may be used to treat chronic pain. A certain type of antidepressant (tricyclics) is often used. These medicines are given in lower doses for pain than when used for depression. Anticonvulsants. These are usually used to treat seizures but may also reduce nerve (neuropathic) pain. Muscle relaxants. These relieve pain caused by sudden muscle tightening (spasms). You may also use a pain medicine that is applied to the skin as a patch, cream, or gel (topical analgesic), such as a numbing medicine. These may cause fewer side effects than medicines taken by mouth. Do certain therapies as directed Some therapies can help with pain management. They include: Physical therapy. You will do exercises to gain strength and flexibility. A physical therapist may teach you exercises to move and stretch parts of your body that are weak, stiff, or painful. You can learn these exercises at physical therapy visits and practice them at home. Physical therapy may also involve: Massage. Heat wraps or applying heat or cold to affected areas. Electrical signals that interrupt pain signals (transcutaneous electrical nerve stimulation, TENS). Weak lasers that reduce pain and swelling (low-level laser therapy). Signals from your body that help you learn to regulate pain (biofeedback). Occupational therapy. This helps you to learn ways  to function at home and work with less pain. Recreational therapy. This involves trying new activities or hobbies, such as a physical activity or drawing. Mental health therapy, including: Cognitive behavioral therapy (CBT). This helps  you learn coping skills for dealing with pain. Acceptance and commitment therapy (ACT) to change the way you think and react to pain. Relaxation therapies, including muscle relaxation exercises and mindfulness-based stress reduction. Pain management counseling. This may be individual, family, or group counseling.  Receive medical treatments Medical treatments for pain management include: Nerve block injections. These may include a pain blocker and anti-inflammatory medicines. You may have injections: Near the spine to relieve chronic back or neck pain. Into joints to relieve back or joint pain. Into nerve areas that supply a painful area to relieve body pain. Into muscles (trigger point injections) to relieve some painful muscle conditions. A medical device placed near your spine to help block pain signals and relieve nerve pain or chronic back pain (spinal cord stimulation device). Acupuncture. Follow these instructions at home Medicines Take over-the-counter and prescription medicines only as told by your health care provider. If you are taking pain medicine, ask your health care providers about possible side effects to watch out for. Do not drive or use heavy machinery while taking prescription opioid pain medicine. Lifestyle  Do not use drugs or alcohol to reduce pain. If you drink alcohol, limit how much you have to: 0-1 drink a day for women who are not pregnant. 0-2 drinks a day for men. Know how much alcohol is in a drink. In the [place] one drink equals one 12 oz bottle of beer (355 mL), one 5 oz glass of wine (148 mL), or one 1 oz glass of hard liquor (44 mL). Do not use any products that contain nicotine or tobacco. These products include cigarettes, chewing tobacco, and vaping devices, such as e-cigarettes. If you need help quitting, ask your health care provider. Eat a healthy diet and maintain a healthy weight. Poor diet and excess weight may make pain worse. Eat foods that  are high in fiber. These include fresh fruits and vegetables, whole grains, and beans. Limit foods that are high in fat and processed sugars, such as fried and sweet foods. Exercise regularly. Exercise lowers stress and may help relieve pain. Ask your health care provider what activities and exercises are safe for you. If your health care provider approves, join an exercise class that combines movement and stress reduction. Examples include yoga and tai chi. Get enough sleep. Lack of sleep may make pain worse. Lower stress as much as possible. Practice stress reduction techniques as told by your therapist. General instructions Work with all your pain management providers to find the treatments that work best for you. You are an important member of your pain management team. There are many things you can do to reduce pain on your own. Consider joining an online or in-person support group for people who have chronic pain. Keep all follow-up visits. This is important. Where to find more information You can find more information about managing pain without opioids from: American Academy of Pain Medicine: painmed.org Institute for Chronic Pain: instituteforchronicpain.org American Chronic Pain Association: theacpa.org Contact a health care provider if: You have side effects from pain medicine. Your pain gets worse or does not get better with treatments or home therapy. You are struggling with anxiety or depression. Summary Many types of pain can be managed without opioids. Chronic pain may respond better to pain management  without opioids. Pain is best managed when you and a team of health care providers work together. Pain management without opioids may include non-opioid medicines, medical treatments, physical therapy, mental health therapy, and lifestyle changes. Tell your health care providers if your pain gets worse or is not being managed well enough. This information is not intended to  replace advice given to you by your health care provider. Make sure you discuss any questions you have with your health care provider. Document Revised: 10/23/2020 Document Reviewed: 10/23/2020 Elsevier Patient Education  2024 [employer].

## 2023-07-26 NOTE — Progress Notes (Signed)
Subjective:   [name] Holder is a 67 y.o. male who presents for Medicare Annual/Subsequent preventive examination.  Visit Complete: Virtual I connected with  [name] Holder on 07/26/23 by a audio enabled telemedicine application and verified that I am speaking with the correct person using two identifiers.  Patient Location: Home  Provider Location: Office/Clinic  I discussed the limitations of evaluation and management by telemedicine. The patient expressed understanding and agreed to proceed.  Vital Signs: Because this visit was a virtual/telehealth visit, some criteria may be missing or patient reported. Any vitals not documented were not able to be obtained and vitals that have been documented are patient reported.  Patient Medicare AWV questionnaire was completed by the patient on 07/22/2023; I have confirmed that all information answered by patient is correct and no changes since this date.        Objective:    Today's Vitals   07/22/23 1037 07/26/23 0814  Weight:  218 lb (98.9 kg)  Height:  5\' 7"  (1.702 m)  PainSc: 4     Body mass index is 34.14 kg/m.     07/26/2023    8:26 AM 09/03/2022    4:43 PM 09/03/2022   11:42 AM 02/12/2022   10:17 AM 09/17/2021    1:00 AM 09/16/2021   11:52 AM 09/04/2020   10:17 AM  Advanced Directives  Does Patient Have a Medical Advance Directive? Yes Yes Yes Yes Yes Yes Yes  Type of Estate agent of [place];Living will Healthcare Power of [place];Living will  Living will;Healthcare Power of Attorney Living will    Does patient want to make changes to medical advance directive?  No - Patient declined No - Patient declined No - Patient declined  No - Patient declined No - Patient declined  Copy of Healthcare Power of Attorney in Chart? No - copy requested No - copy requested  No - copy requested       Current Medications (verified) Outpatient Encounter Medications as of 07/26/2023  Medication Sig   abiraterone  acetate (ZYTIGA) 250 MG tablet Take 4 tablets (1,000 mg total) by mouth daily.   acetaminophen (TYLENOL) 325 MG tablet Take 325 mg by mouth daily as needed for fever.   albuterol (PROVENTIL) (2.5 MG/3ML) 0.083% nebulizer solution Take 3 mLs (2.5 mg total) by nebulization every 6 (six) hours as needed for wheezing or shortness of breath.   albuterol (VENTOLIN HFA) 108 (90 Base) MCG/ACT inhaler INHALE 2 PUFFS EVERY 6 HOURS AS NEEDED FOR WHEEZING OR SHORTNESS OF BREATH   azelastine (ASTELIN) 0.1 % nasal spray USE 2 SPRAYS IN EACH NOSTRIL EVERY MORNING AND EVERY EVENING   CALCIUM-VITAMIN D PO Take 1 tablet by mouth 2 (two) times daily.    carvedilol (COREG) 3.125 MG tablet TAKE ONE TABLET TWICE DAILY WITH FOOD   denosumab (PROLIA) 60 MG/ML SOSY injection Inject 60 mg into the skin every 6 (six) months.   DULoxetine (CYMBALTA) 20 MG capsule TAKE ONE CAPSULE DAILY   Famotidine (PEPCID AC PO) Take 1 tablet by mouth at bedtime.   fluticasone (FLONASE) 50 MCG/ACT nasal spray USE 2 SPRAYS IN EACH NOSTRIL ONCE DAILY   furosemide (LASIX) 20 MG tablet TAKE ONE TABLET TWICE DAILY   leuprolide (LUPRON) 11.25 MG injection Inject 11.25 mg into the muscle every 3 (three) months.   lubiprostone (AMITIZA) 24 MCG capsule TAKE ONE CAPSULE TWICE DAILY WITH MEAL(S)   meclizine (ANTIVERT) 12.5 MG tablet Take 1 tablet (12.5 mg total) by mouth  3 (three) times daily as needed for dizziness.   Multiple Vitamins-Minerals (CENTRUM SILVER 50+MEN PO) Take 1 tablet by mouth daily.   NARCAN 4 MG/0.1ML LIQD nasal spray kit Place 0.4 mg into the nose once as needed (overdose).    olmesartan (BENICAR) 20 MG tablet TAKE ONE TABLET DAILY   ondansetron (ZOFRAN) 4 MG tablet Take 1 tablet (4 mg total) by mouth every 6 (six) hours as needed for nausea.   oxyCODONE (OXY IR/ROXICODONE) 5 MG immediate release tablet Take 1 tablet (5 mg) by mouth every 4 hours as needed for severe pain.   predniSONE (DELTASONE) 5 MG tablet TAKE 1 TABLET 2  TIMES A DAY   sodium chloride 1 g tablet Take 1 g by mouth 2 (two) times daily with a meal.   solifenacin (VESICARE) 5 MG tablet Take 5 mg by mouth daily.   Specialty Vitamins Products (VISTA ADVANCED CAROTENOID) CAPS Take 1 capsule by mouth every evening.   TRELEGY ELLIPTA 100-62.5-25 MCG/ACT AEPB INHALE 1 PUFF INTO LUNGS DAILY   vitamin C (ASCORBIC ACID) 500 MG tablet Take 500 mg by mouth every evening.   vitamin E 180 MG (400 UNITS) capsule Take 400 Units by mouth every evening.   polyethylene glycol powder (GLYCOLAX/MIRALAX) 17 GM/SCOOP powder Take 17 g (1 capful) by mouth daily as directed. (Patient not taking: Reported on 07/26/2023)   rosuvastatin (CRESTOR) 10 MG tablet Take 1 tablet (10 mg total) by mouth daily. (Patient not taking: Reported on 07/26/2023)   No facility-administered encounter medications on file as of 07/26/2023.    Allergies (verified) Amlodipine and Tetracycline   History: Past Medical History:  Diagnosis Date   Allergic rhinitis    At risk for sleep apnea    STOP-BANG SCORE = 5    (routed to pt's pcp 09-08-2018)   Bladder cancer (HCC) dx'd 2020   Chronic constipation    Chronic low back pain    Chronic pain    DOE (dyspnea on exertion)    History of kidney stones    History of recent pneumonia 08/10/2018   acute bronchopneumonia;  follow-up cxr 08-28-2018 in epic   History of vertebral compression fracture    T10 and T12 s/p kyphoplasty   Hx of colonic polyps    Hyperplasia of prostate with lower urinary tract symptoms (LUTS)    Hypertension    Mild persistent asthma    followed by pcp   Numbness in both legs    secondary to a fall, per pt has had work-up and couldn't find anything   Osteopenia    Prostate cancer metastatic to pelvis [facility]) urologist-- dr borden/  oncologist -- dr Clelia Croft   dx 2014 advanced prostate cancer with pelvid adenopathy,  Stage T2c, Gleason 7;  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   Wears glasses    Past Surgical History:   Procedure Laterality Date   COLONOSCOPY     IR KYPHO EA ADDL LEVEL THORACIC OR LUMBAR  08/17/2018   IR KYPHO EA ADDL LEVEL THORACIC OR LUMBAR  08/26/2018   IR KYPHO LUMBAR INC FX REDUCE BONE BX UNI/BIL CANNULATION INC/IMAGING  08/26/2018   IR KYPHO THORACIC WITH BONE BIOPSY  08/17/2018   IR RADIOLOGIST EVAL & MGMT  08/11/2018   IR RADIOLOGIST EVAL & MGMT  08/24/2018   LYMPHADENECTOMY Left 01/05/2013   Procedure: ROBOTIC LYMPHADENECTOMY;  Surgeon: Crecencio Mc, MD;  Location: [facility];  Service: Urology;  Laterality: Left;   PROSTATE BIOPSY     11/12 positive biopsies  ROTATOR CUFF REPAIR Left 2012   TRANSURETHRAL RESECTION OF BLADDER TUMOR N/A 09/15/2018   Procedure: TRANSURETHRAL RESECTION OF BLADDER TUMOR (TURBT)WITH CYSTOSCOPY/ POST OPERATIVE INSTILLATION OF GEMCITABINE;  Surgeon: Heloise Purpura, MD;  Location: [facility];  Service: Urology;  Laterality: N/A;  GENERAL ANESTHESIA WITH PARALYSIS   UMBILICAL HERNIA REPAIR  09-02-2001   dr t. 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  Tobacco Use   Smoking status: Former    Current packs/day: 0.00    Average packs/day: 1 pack/day for 25.0 years (25.0 ttl pk-yrs)    Types: Cigarettes    Start date: 09/09/1987    Quit date: 09/08/2012    Years since quitting: 10.8   Smokeless tobacco: Never  Vaping Use   Vaping  status: Former   Start date: 09/08/2012   Quit date: 07/08/2018   Devices: e-cigarette,  e-blu  Substance and Sexual Activity   Alcohol use: Not Currently    Alcohol/week: 2.0 standard drinks of alcohol    Types: 2 Glasses of wine per week   Drug use: Never   Sexual activity: Not on file  Other Topics Concern   Not on file  Social History Narrative   Regular exercise-yesCaffeine Use-yes   Lives with wife   Social Drivers of Health   Financial Resource Strain: Low Risk  (02/12/2022)   Overall Financial Resource Strain (CARDIA)    Difficulty of Paying Living Expenses: Not hard at all  Food Insecurity: No Food Insecurity (09/07/2022)   Hunger Vital Sign    Worried About Running Out of Food in the Last Year: Never true    Ran Out of Food in the Last Year: Never true  Transportation Needs: No Transportation Needs (09/06/2022)   PRAPARE - Administrator, Civil Service (Medical): No    Lack of Transportation (Non-Medical): No  Physical Activity: Inactive (02/12/2022)   Exercise Vital Sign    Days of Exercise per Week: 0 days    Minutes of Exercise per Session: 0 min  Stress: No Stress Concern Present (02/12/2022)   [employer] of Occupational Health - Occupational Stress Questionnaire    Feeling of Stress : Not at all  Social Connections: Socially Integrated (02/12/2022)   Social Connection and Isolation Panel [NHANES]    Frequency of Communication with Friends and Family: More than three times a week    Frequency of Social Gatherings with Friends and Family: Three times a week    Attends Religious Services: More than 4 times per year    Active Member of Clubs or Organizations: Yes    Attends Banker Meetings: More than 4  times per year    Marital Status: Married    Tobacco Counseling Counseling given: Not Answered   Clinical Intake:  Pre-visit preparation completed: Yes  Pain : 0-10 Pain Score: 4  Pain Type: Acute pain Pain Location: Back      BMI - recorded: 34.14 Nutritional Status: BMI > 30  Obese Nutritional Risks: None  How often do you need to have someone help you when you read instructions, pamphlets, or other written materials from your doctor or pharmacy?: 1 - Never  Interpreter Needed?: No  Information entered by :: Jahred Tatar   Activities of Daily Living    07/22/2023   10:37 AM 09/03/2022    4:43 PM  In your present state of health, do you have any difficulty performing the following activities:  Hearing? 0 0  Vision? 0 0  Difficulty concentrating or making decisions? 0 0  Walking or climbing stairs? 1 0  Dressing or bathing? 1 0  Doing errands, shopping? 1 0  Preparing Food and eating ? N   Using the Toilet? N   In the past six months, have you accidently leaked urine? Y   Do you have problems with loss of bowel control? N   Managing your Medications? N   Managing your Finances? N   Housekeeping or managing your Housekeeping? Y     Patient Care Team: Etta Grandchild, MD as PCP - General (Internal Medicine) Kathyrn Sheriff, [facility] (Inactive) as Pharmacist (Pharmacist) Conley Rolls, [facility] [place] [place] as Referring Physician (Optometry) [name], MD as Attending Physician (Hematology and Oncology)  Indicate any recent Medical Services you may have received from other than [facility] providers in the past year (date may be approximate).     Assessment:   This is a routine wellness examination for Khalyl.  Hearing/Vision screen No results found.   Goals Addressed             This Visit's Progress    To join Geophysicist/field seismologist Program at [employer] and use a Systems analyst.   Not on track     Depression Screen    05/17/2023    2:05 PM 12/15/2022    1:47 PM 04/14/2022   11:21 AM 02/12/2022    9:59 AM 10/28/2021   11:05 AM 09/30/2020    3:50 PM 04/18/2019    4:31 PM  PHQ 2/9 Scores  PHQ - 2 Score 0 0 0 0 0 0 2  PHQ- 9 Score  0 1    4    Fall Risk    07/22/2023   10:37 AM 05/17/2023    2:05 PM  12/15/2022    1:47 PM 04/14/2022   11:21 AM 02/12/2022    9:50 AM  Fall Risk   Falls in the past year? 1 1 0 0 0  Number falls in past yr: 0 0 0 0 0  Injury with Fall? 1 0 0 0 0  Risk for fall due to :  History of fall(s)  No Fall Risks   Follow up  Falls evaluation completed Falls evaluation completed Falls evaluation completed     MEDICARE RISK AT HOME: Medicare Risk at Home Any stairs in or around the home?: (Patient-Rptd) No Home free of loose throw rugs in walkways, pet beds, electrical cords, etc?: (Patient-Rptd) Yes Adequate lighting in your home to reduce risk of falls?: (Patient-Rptd) Yes Life alert?: (Patient-Rptd) No Use of a cane, walker or w/c?: (Patient-Rptd) Yes Grab bars in the bathroom?: (Patient-Rptd) No  Shower chair or bench in shower?: (Patient-Rptd) Yes Elevated toilet seat or a handicapped toilet?: (Patient-Rptd) Yes  TIMED UP AND GO:  Was the test performed?  No    Cognitive Function:        02/12/2022   10:22 AM  6CIT Screen  What Year? 0 points  What month? 0 points  What time? 0 points  Count back from 20 0 points  Months in reverse 0 points  Repeat phrase 0 points  Total Score 0 points    Immunizations Immunization History  Administered Date(s) Administered   Fluad Quad(high Dose 65+) 04/17/2019, 05/15/2020, 04/18/2021   Influenza Inj Mdck Quad Pf 04/17/2019   Influenza Split 04/26/2013, 05/10/2014   Influenza, High Dose Seasonal PF 04/27/2019   Influenza,inj,Quad PF,6+ Mos 05/19/2017   Influenza-Unspecified 05/10/2016, 05/06/2018, 04/18/2021   Moderna Covid-19 Vaccine Bivalent Booster 37yrs & up 05/13/2021   PFIZER SARS-COV-2 Pediatric Vaccination 5-50yrs 11/08/2020   PFIZER(Purple Top)SARS-COV-2 Vaccination 09/01/2019, 09/22/2019, 04/23/2020, 05/27/2020   Pneumococcal Conjugate-13 08/29/2015   Pneumococcal Polysaccharide-23 01/06/2013, 04/27/2019   Rsv, Bivalent, Protein Subunit Rsvpref,pf Verdis Frederickson) 04/20/2022   Tdap 08/26/2012    Zoster Recombinant(Shingrix) 06/17/2020, 11/20/2020    TDAP status: Due, Education has been provided regarding the importance of this vaccine. Advised may receive this vaccine at local pharmacy or Health Dept. Aware to provide a copy of the vaccination record if obtained from local pharmacy or Health Dept. Verbalized acceptance and understanding.  Flu Vaccine status: Up to date  Pneumococcal vaccine status: Up to date  Covid-19 vaccine status: Completed vaccines  Qualifies for Shingles Vaccine? Yes   Zostavax completed Yes   Shingrix Completed?: Yes  Screening Tests Health Maintenance  Topic Date Due   DTaP/Tdap/Td (2 - Td or Tdap) 08/26/2022   Lung Cancer Screening  05/16/2024 (Originally 02/15/2021)   Medicare Annual Wellness (AWV)  07/25/2024   Colonoscopy  02/10/2028   Pneumonia Vaccine 2+ Years old  Completed   Hepatitis C Screening  Completed   Zoster Vaccines- Shingrix  Completed   HPV VACCINES  Aged Out   INFLUENZA VACCINE  Discontinued   COVID-19 Vaccine  Discontinued    Health Maintenance  Health Maintenance Due  Topic Date Due   DTaP/Tdap/Td (2 - Td or Tdap) 08/26/2022    Colorectal cancer screening: Type of screening: Colonoscopy. Completed 02/10/2023. Repeat every 5 years  Lung Cancer Screening: (Low Dose CT Chest recommended if Age 71-80 years, 20 pack-year currently smoking OR have quit w/in 15years.) does qualify.   Lung Cancer Screening Referral: N/A  Additional Screening:  Hepatitis C Screening: does qualify; Completed 09/16/2017  Vision Screening: Recommended annual ophthalmology exams for early detection of glaucoma and other disorders of the eye. Is the patient up to date with their annual eye exam?  Yes  Who is the provider or what is the name of the office in which the patient attends annual eye exams? [facility] in [place], [place] If pt is not established with a provider, would they like to be referred to a provider to establish care? No .    Dental Screening: Recommended annual dental exams for proper oral hygiene   Community Resource Referral / Chronic Care Management: CRR required this visit?  No   CCM required this visit?  No     Plan:     I have personally reviewed and noted the following in the patient's chart:   Medical and social history Use of alcohol, tobacco or illicit drugs  Current medications and supplements  including opioid prescriptions. Patient is currently taking opioid prescriptions. Information provided to patient regarding non-opioid alternatives. Patient advised to discuss non-opioid treatment plan with their provider. Functional ability and status Nutritional status Physical activity Advanced directives List of other physicians Hospitalizations, surgeries, and ER visits in previous 12 months Vitals Screenings to include cognitive, depression, and falls Referrals and appointments  In addition, I have reviewed and discussed with patient certain preventive protocols, quality metrics, and best practice recommendations. A written personalized care plan for preventive services as well as general preventive health recommendations were provided to patient.     Lynnsie Linders L Kateri Balch, CMA   07/26/2023   After Visit Summary: (MyChart) Due to this being a telephonic visit, the after visit summary with patients personalized plan was offered to patient via MyChart   Nurse Notes: Patient is eligible for a lung screening.  Patient stated that he has an appointment in January with Pulmonology.  He is due for a Tdap.  Patient explains that he has not started taking the Crestor as of yet, as he wanted to see oncology first before he started taking it.  He also stated that he has had constipation and he is trying to manage it by taking Amitiza and an over the counter stool softener.  Patient is up to date with all other health maintenance.  He had no other concerns to address today.

## 2023-08-04 ENCOUNTER — Other Ambulatory Visit: Payer: Self-pay | Admitting: Pulmonary Disease

## 2023-08-04 DIAGNOSIS — J4521 Mild intermittent asthma with (acute) exacerbation: Secondary | ICD-10-CM

## 2023-08-07 ENCOUNTER — Encounter: Payer: Self-pay | Admitting: Nurse Practitioner

## 2023-08-07 ENCOUNTER — Telehealth: Payer: Medicare Other | Admitting: Nurse Practitioner

## 2023-08-07 DIAGNOSIS — E669 Obesity, unspecified: Secondary | ICD-10-CM | POA: Diagnosis not present

## 2023-08-07 DIAGNOSIS — J189 Pneumonia, unspecified organism: Secondary | ICD-10-CM | POA: Diagnosis not present

## 2023-08-07 DIAGNOSIS — L03113 Cellulitis of right upper limb: Secondary | ICD-10-CM | POA: Diagnosis not present

## 2023-08-07 DIAGNOSIS — K5903 Drug induced constipation: Secondary | ICD-10-CM | POA: Diagnosis not present

## 2023-08-07 DIAGNOSIS — J4489 Other specified chronic obstructive pulmonary disease: Secondary | ICD-10-CM | POA: Diagnosis not present

## 2023-08-07 MED ORDER — MUPIROCIN 2 % EX OINT: 1.0000 | TOPICAL_OINTMENT | Freq: Two times a day (BID) | CUTANEOUS | 1 refills | Status: DC

## 2023-08-07 MED ORDER — SULFAMETHOXAZOLE-TRIMETHOPRIM 800-160 MG PO TABS: 1.0000 | ORAL_TABLET | Freq: Two times a day (BID) | ORAL | 0 refills | Status: AC

## 2023-08-07 NOTE — Progress Notes (Signed)
 Virtual Visit Consent   [name] Holder, you are scheduled for a virtual visit with a [facility] provider today. Just as with appointments in the office, your consent must be obtained to participate. Your consent will be active for this visit and any virtual visit you may have with one of our providers in the next 365 days. If you have a MyChart account, a copy of this consent can be sent to you electronically.  As this is a virtual visit, video technology does not allow for your provider to perform a traditional examination. This may limit your provider's ability to fully assess your condition. If your provider identifies any concerns that need to be evaluated in person or the need to arrange testing (such as labs, EKG, etc.), we will make arrangements to do so. Although advances in technology are sophisticated, we cannot ensure that it will always work on either your end or our end. If the connection with a video visit is poor, the visit may have to be switched to a telephone visit. With either a video or telephone visit, we are not always able to ensure that we have a secure connection.  By engaging in this virtual visit, you consent to the provision of healthcare and authorize for your insurance to be billed (if applicable) for the services provided during this visit. Depending on your insurance coverage, you may receive a charge related to this service.  I need to obtain your verbal consent now. Are you willing to proceed with your visit today? [name] Holder has provided verbal consent on 08/07/2023 for a virtual visit (video or telephone). [name] LELON Servant, NP  Date: 08/07/2023 3:10 PM  Virtual Visit via Video Note   I, [name] Holder, connected with  [name] Holder  ([id number], Mar 31, 1956) on 08/07/23 at  4:00 PM EST by a video-enabled telemedicine application and verified that I am speaking with the correct person using two identifiers.  Location: Patient: Virtual Visit Location  Patient: Home Provider: Virtual Visit Location Provider: Home Office   I discussed the limitations of evaluation and management by telemedicine and the availability of in person appointments. The patient expressed understanding and agreed to proceed.    History of Present Illness: [name] Holder is a 68 y.o. who identifies as a male who was assigned male at birth, and is being seen today for RUE cellulitis.  Mr Bierlein fell down in his kitchen 2 days ago. He hit his head on the counter and sustained a laceration to the lateral right forearm. Today he denies any neurological deficits or headache however his arm is now draining, red and warm to touch. Spouse has been using tegaderm and sterile wound cleaning solution to the area.   Problems:  Patient Active Problem List   Diagnosis Date Noted   Low back pain 09/08/2022   Class 2 obesity 09/03/2022   Panic anxiety syndrome 09/02/2022   Therapeutic opioid-induced constipation (OIC) 09/02/2022   Vertigo 06/02/2022   Encounter for general adult medical examination with abnormal findings 10/28/2021   Prostate cancer (HCC) 09/16/2021   SIADH (syndrome of inappropriate ADH production) (HCC) 09/16/2021   Asthma-COPD overlap syndrome (HCC) 12/19/2019   Anemia of chronic disease 04/20/2019   Hyperglycemia 11/24/2018   Pathologic compression fracture of spine, initial encounter (HCC) 09/13/2018   Cancer associated pain 09/13/2018   Secondary malignant neoplasm of bone and bone marrow (HCC) 08/06/2018   Spinal stenosis in cervical region 04/12/2018   Hyperlipidemia with target LDL  less than 100 12/21/2013   Essential hypertension 05/20/2010   Allergic rhinitis 05/20/2010   Asthma, mild intermittent 05/20/2010    Allergies:  Allergies  Allergen Reactions   Amlodipine  Swelling    edema   Tetracycline Rash   Medications:  Current Outpatient Medications:    mupirocin  ointment (BACTROBAN ) 2 %, Apply 1 Application topically 2 (two) times  daily., Disp: 30 g, Rfl: 1   sulfamethoxazole -trimethoprim  (BACTRIM  DS) 800-160 MG tablet, Take 1 tablet by mouth 2 (two) times daily for 7 days., Disp: 14 tablet, Rfl: 0   abiraterone  acetate (ZYTIGA ) 250 MG tablet, Take 4 tablets (1,000 mg total) by mouth daily., Disp: 120 tablet, Rfl: 5   acetaminophen  (TYLENOL ) 325 MG tablet, Take 325 mg by mouth daily as needed for fever., Disp: , Rfl:    albuterol  (PROVENTIL ) (2.5 MG/3ML) 0.083% nebulizer solution, Take 3 mLs (2.5 mg total) by nebulization every 6 (six) hours as needed for wheezing or shortness of breath., Disp: 360 mL, Rfl: 2   albuterol  (VENTOLIN  HFA) 108 (90 Base) MCG/ACT inhaler, INHALE 2 PUFFS EVERY 6 HOURS AS NEEDED FOR WHEEZING OR SHORTNESS OF BREATH, Disp: 18 g, Rfl: 3   azelastine  (ASTELIN ) 0.1 % nasal spray, USE 2 SPRAYS IN EACH NOSTRIL EVERY MORNING AND EVERY EVENING, Disp: 90 mL, Rfl: 1   CALCIUM -VITAMIN D  PO, Take 1 tablet by mouth 2 (two) times daily. , Disp: , Rfl:    carvedilol  (COREG ) 3.125 MG tablet, TAKE ONE TABLET TWICE DAILY WITH FOOD, Disp: 180 tablet, Rfl: 0   denosumab  (PROLIA ) 60 MG/ML SOSY injection, Inject 60 mg into the skin every 6 (six) months., Disp: , Rfl:    DULoxetine  (CYMBALTA ) 20 MG capsule, TAKE ONE CAPSULE DAILY, Disp: 30 capsule, Rfl: 1   Famotidine  (PEPCID  AC PO), Take 1 tablet by mouth at bedtime., Disp: , Rfl:    fluticasone  (FLONASE ) 50 MCG/ACT nasal spray, USE 2 SPRAYS IN EACH NOSTRIL ONCE DAILY, Disp: 48 g, Rfl: 1   furosemide  (LASIX ) 20 MG tablet, TAKE ONE TABLET TWICE DAILY, Disp: 180 tablet, Rfl: 0   leuprolide  (LUPRON ) 11.25 MG injection, Inject 11.25 mg into the muscle every 3 (three) months., Disp: , Rfl:    lubiprostone  (AMITIZA ) 24 MCG capsule, TAKE ONE CAPSULE TWICE DAILY WITH MEAL(S), Disp: 180 capsule, Rfl: 0   meclizine  (ANTIVERT ) 12.5 MG tablet, Take 1 tablet (12.5 mg total) by mouth 3 (three) times daily as needed for dizziness., Disp: 270 tablet, Rfl: 0   Multiple Vitamins-Minerals  (CENTRUM SILVER 50+MEN PO), Take 1 tablet by mouth daily., Disp: , Rfl:    NARCAN  4 MG/0.1ML LIQD nasal spray kit, Place 0.4 mg into the nose once as needed (overdose). , Disp: , Rfl:    olmesartan  (BENICAR ) 20 MG tablet, TAKE ONE TABLET DAILY, Disp: 90 tablet, Rfl: 0   ondansetron  (ZOFRAN ) 4 MG tablet, Take 1 tablet (4 mg total) by mouth every 6 (six) hours as needed for nausea., Disp: 20 tablet, Rfl: 0   oxyCODONE  (OXY IR/ROXICODONE ) 5 MG immediate release tablet, Take 1 tablet (5 mg) by mouth every 4 hours as needed for severe pain., Disp: 180 tablet, Rfl: 0   polyethylene glycol powder (GLYCOLAX /MIRALAX ) 17 GM/SCOOP powder, Take 17 g (1 capful) by mouth daily as directed. (Patient not taking: Reported on 07/26/2023), Disp: 510 g, Rfl: 6   predniSONE  (DELTASONE ) 5 MG tablet, TAKE 1 TABLET 2 TIMES A DAY, Disp: 90 tablet, Rfl: 3   rosuvastatin  (CRESTOR ) 10 MG tablet, Take 1 tablet (  10 mg total) by mouth daily. (Patient not taking: Reported on 07/26/2023), Disp: 90 tablet, Rfl: 1   sodium chloride  1 g tablet, Take 1 g by mouth 2 (two) times daily with a meal., Disp: , Rfl:    solifenacin (VESICARE) 5 MG tablet, Take 5 mg by mouth daily., Disp: , Rfl:    Specialty Vitamins Products (VISTA ADVANCED CAROTENOID) CAPS, Take 1 capsule by mouth every evening., Disp: , Rfl:    TRELEGY ELLIPTA  100-62.5-25 MCG/ACT AEPB, INHALE 1 PUFF INTO LUNGS DAILY, Disp: 120 each, Rfl: 3   vitamin C (ASCORBIC ACID ) 500 MG tablet, Take 500 mg by mouth every evening., Disp: , Rfl:    vitamin E  180 MG (400 UNITS) capsule, Take 400 Units by mouth every evening., Disp: , Rfl:   Observations/Objective: Patient is well-developed, well-nourished in no acute distress.  Resting comfortably at home.  Head is normocephalic, atraumatic.  No labored breathing.  Speech is clear and coherent with logical content.  Patient is alert and oriented at baseline.  Right lateral forearm to elbow is erythematous, swollen and with several  areas of abrasion.   Assessment and Plan: 1. Cellulitis of right upper extremity (Primary) - mupirocin  ointment (BACTROBAN ) 2 %; Apply 1 Application topically 2 (two) times daily.  Dispense: 30 g; Refill: 1 - sulfamethoxazole -trimethoprim  (BACTRIM  DS) 800-160 MG tablet; Take 1 tablet by mouth 2 (two) times daily for 7 days.  Dispense: 14 tablet; Refill: 0   Follow Up Instructions: I discussed the assessment and treatment plan with the patient. The patient was provided an opportunity to ask questions and all were answered. The patient agreed with the plan and demonstrated an understanding of the instructions.  A copy of instructions were sent to the patient via MyChart unless otherwise noted below.    The patient was advised to call back or seek an in-person evaluation if the symptoms worsen or if the condition fails to improve as anticipated.    Shawntelle Ungar W Jamael Hoffmann, NP

## 2023-08-07 NOTE — Patient Instructions (Signed)
 [name] Holder, thank you for joining Haze LELON Servant, NP for today's virtual visit.  While this provider is not your primary care provider (PCP), if your PCP is located in our provider database this encounter information will be shared with them immediately following your visit.   A [facility] MyChart account gives you access to today's visit and all your visits, tests, and labs performed at [facility]  click here if you don't have a [place] MyChart account or go to [email or web address]  Consent: (Patient) [name] Holder provided verbal consent for this virtual visit at the beginning of the encounter.  Current Medications:  Current Outpatient Medications:    mupirocin  ointment (BACTROBAN ) 2 %, Apply 1 Application topically 2 (two) times daily., Disp: 30 g, Rfl: 1   sulfamethoxazole -trimethoprim  (BACTRIM  DS) 800-160 MG tablet, Take 1 tablet by mouth 2 (two) times daily for 7 days., Disp: 14 tablet, Rfl: 0   abiraterone  acetate (ZYTIGA ) 250 MG tablet, Take 4 tablets (1,000 mg total) by mouth daily., Disp: 120 tablet, Rfl: 5   acetaminophen  (TYLENOL ) 325 MG tablet, Take 325 mg by mouth daily as needed for fever., Disp: , Rfl:    albuterol  (PROVENTIL ) (2.5 MG/3ML) 0.083% nebulizer solution, Take 3 mLs (2.5 mg total) by nebulization every 6 (six) hours as needed for wheezing or shortness of breath., Disp: 360 mL, Rfl: 2   albuterol  (VENTOLIN  HFA) 108 (90 Base) MCG/ACT inhaler, INHALE 2 PUFFS EVERY 6 HOURS AS NEEDED FOR WHEEZING OR SHORTNESS OF BREATH, Disp: 18 g, Rfl: 3   azelastine  (ASTELIN ) 0.1 % nasal spray, USE 2 SPRAYS IN EACH NOSTRIL EVERY MORNING AND EVERY EVENING, Disp: 90 mL, Rfl: 1   CALCIUM -VITAMIN D  PO, Take 1 tablet by mouth 2 (two) times daily. , Disp: , Rfl:    carvedilol  (COREG ) 3.125 MG tablet, TAKE ONE TABLET TWICE DAILY WITH FOOD, Disp: 180 tablet, Rfl: 0   denosumab  (PROLIA ) 60 MG/ML SOSY injection, Inject 60 mg into the skin every 6 (six) months.,  Disp: , Rfl:    DULoxetine  (CYMBALTA ) 20 MG capsule, TAKE ONE CAPSULE DAILY, Disp: 30 capsule, Rfl: 1   Famotidine  (PEPCID  AC PO), Take 1 tablet by mouth at bedtime., Disp: , Rfl:    fluticasone  (FLONASE ) 50 MCG/ACT nasal spray, USE 2 SPRAYS IN EACH NOSTRIL ONCE DAILY, Disp: 48 g, Rfl: 1   furosemide  (LASIX ) 20 MG tablet, TAKE ONE TABLET TWICE DAILY, Disp: 180 tablet, Rfl: 0   leuprolide  (LUPRON ) 11.25 MG injection, Inject 11.25 mg into the muscle every 3 (three) months., Disp: , Rfl:    lubiprostone  (AMITIZA ) 24 MCG capsule, TAKE ONE CAPSULE TWICE DAILY WITH MEAL(S), Disp: 180 capsule, Rfl: 0   meclizine  (ANTIVERT ) 12.5 MG tablet, Take 1 tablet (12.5 mg total) by mouth 3 (three) times daily as needed for dizziness., Disp: 270 tablet, Rfl: 0   Multiple Vitamins-Minerals (CENTRUM SILVER 50+MEN PO), Take 1 tablet by mouth daily., Disp: , Rfl:    NARCAN  4 MG/0.1ML LIQD nasal spray kit, Place 0.4 mg into the nose once as needed (overdose). , Disp: , Rfl:    olmesartan  (BENICAR ) 20 MG tablet, TAKE ONE TABLET DAILY, Disp: 90 tablet, Rfl: 0   ondansetron  (ZOFRAN ) 4 MG tablet, Take 1 tablet (4 mg total) by mouth every 6 (six) hours as needed for nausea., Disp: 20 tablet, Rfl: 0   oxyCODONE  (OXY IR/ROXICODONE ) 5 MG immediate release tablet, Take 1 tablet (5 mg) by mouth every 4 hours as needed for severe pain.,  Disp: 180 tablet, Rfl: 0   polyethylene glycol powder (GLYCOLAX /MIRALAX ) 17 GM/SCOOP powder, Take 17 g (1 capful) by mouth daily as directed. (Patient not taking: Reported on 07/26/2023), Disp: 510 g, Rfl: 6   predniSONE  (DELTASONE ) 5 MG tablet, TAKE 1 TABLET 2 TIMES A DAY, Disp: 90 tablet, Rfl: 3   rosuvastatin  (CRESTOR ) 10 MG tablet, Take 1 tablet (10 mg total) by mouth daily. (Patient not taking: Reported on 07/26/2023), Disp: 90 tablet, Rfl: 1   sodium chloride  1 g tablet, Take 1 g by mouth 2 (two) times daily with a meal., Disp: , Rfl:    solifenacin (VESICARE) 5 MG tablet, Take 5 mg by mouth  daily., Disp: , Rfl:    Specialty Vitamins Products (VISTA ADVANCED CAROTENOID) CAPS, Take 1 capsule by mouth every evening., Disp: , Rfl:    TRELEGY ELLIPTA  100-62.5-25 MCG/ACT AEPB, INHALE 1 PUFF INTO LUNGS DAILY, Disp: 120 each, Rfl: 3   vitamin C (ASCORBIC ACID ) 500 MG tablet, Take 500 mg by mouth every evening., Disp: , Rfl:    vitamin E  180 MG (400 UNITS) capsule, Take 400 Units by mouth every evening., Disp: , Rfl:    Medications ordered in this encounter:  Meds ordered this encounter  Medications   mupirocin  ointment (BACTROBAN ) 2 %    Sig: Apply 1 Application topically 2 (two) times daily.    Dispense:  30 g    Refill:  1    Supervising Provider:   BLAISE ALEENE KIDD [[id number]]   sulfamethoxazole -trimethoprim  (BACTRIM  DS) 800-160 MG tablet    Sig: Take 1 tablet by mouth 2 (two) times daily for 7 days.    Dispense:  14 tablet    Refill:  0    Supervising Provider:   BLAISE ALEENE KIDD [[id number]]     *If you need refills on other medications prior to your next appointment, please contact your pharmacy*  Follow-Up: Call back or seek an in-person evaluation if the symptoms worsen or if the condition fails to improve as anticipated.  [facility] [phone]   If you have been instructed to have an in-person evaluation today at a local Urgent Care facility, please use the link below. It will take you to a list of all of our available [facility], including address, phone number and hours of operation. Please do not delay care.  [facility]  If you or a family member do not have a primary care provider, use the link below to schedule a visit and establish care. When you choose a [place] primary care physician or advanced practice provider, you gain a long-term partner in health. Find a Primary Care Provider  Learn more about [facility] in-office and virtual care options:  - Get Care Now

## 2023-08-07 NOTE — Progress Notes (Signed)
 Patient [name] Holder is 55. He was instructed to set up the visit through his own mychart.

## 2023-08-08 ENCOUNTER — Encounter (HOSPITAL_COMMUNITY): Payer: Self-pay

## 2023-08-08 ENCOUNTER — Other Ambulatory Visit: Payer: Self-pay

## 2023-08-08 ENCOUNTER — Emergency Department (HOSPITAL_COMMUNITY): Payer: Medicare Other

## 2023-08-08 ENCOUNTER — Emergency Department (HOSPITAL_COMMUNITY)
Admission: EM | Admit: 2023-08-08 | Discharge: 2023-08-08 | Disposition: A | Discharge status: Home / Self Care | Payer: Medicare Other | Attending: Emergency Medicine | Admitting: Emergency Medicine

## 2023-08-08 DIAGNOSIS — W1839XA Other fall on same level, initial encounter: Secondary | ICD-10-CM | POA: Diagnosis not present

## 2023-08-08 DIAGNOSIS — S6292XA Unspecified fracture of left wrist and hand, initial encounter for closed fracture: Secondary | ICD-10-CM

## 2023-08-08 DIAGNOSIS — S62635A Displaced fracture of distal phalanx of left ring finger, initial encounter for closed fracture: Secondary | ICD-10-CM | POA: Diagnosis not present

## 2023-08-08 DIAGNOSIS — S62311A Displaced fracture of base of second metacarpal bone. left hand, initial encounter for closed fracture: Secondary | ICD-10-CM | POA: Insufficient documentation

## 2023-08-08 DIAGNOSIS — R0602 Shortness of breath: Secondary | ICD-10-CM | POA: Insufficient documentation

## 2023-08-08 DIAGNOSIS — L03113 Cellulitis of right upper limb: Secondary | ICD-10-CM | POA: Diagnosis not present

## 2023-08-08 DIAGNOSIS — I10 Essential (primary) hypertension: Secondary | ICD-10-CM | POA: Diagnosis not present

## 2023-08-08 DIAGNOSIS — S62321A Displaced fracture of shaft of second metacarpal bone, left hand, initial encounter for closed fracture: Secondary | ICD-10-CM | POA: Diagnosis not present

## 2023-08-08 DIAGNOSIS — R531 Weakness: Secondary | ICD-10-CM | POA: Insufficient documentation

## 2023-08-08 DIAGNOSIS — M19042 Primary osteoarthritis, left hand: Secondary | ICD-10-CM | POA: Diagnosis not present

## 2023-08-08 DIAGNOSIS — Z8546 Personal history of malignant neoplasm of prostate: Secondary | ICD-10-CM | POA: Diagnosis not present

## 2023-08-08 DIAGNOSIS — R0600 Dyspnea, unspecified: Secondary | ICD-10-CM | POA: Diagnosis not present

## 2023-08-08 DIAGNOSIS — R410 Disorientation, unspecified: Secondary | ICD-10-CM | POA: Diagnosis not present

## 2023-08-08 DIAGNOSIS — S6992XA Unspecified injury of left wrist, hand and finger(s), initial encounter: Secondary | ICD-10-CM | POA: Diagnosis not present

## 2023-08-08 LAB — URINALYSIS, W/ REFLEX TO CULTURE (INFECTION SUSPECTED)
Bacteria, UA: NONE SEEN
Bilirubin Urine: NEGATIVE
Glucose, UA: NEGATIVE mg/dL
Hgb urine dipstick: NEGATIVE
Ketones, ur: NEGATIVE mg/dL
Leukocytes,Ua: NEGATIVE
Nitrite: NEGATIVE
Protein, ur: NEGATIVE mg/dL
Specific Gravity, Urine: 1.024 (ref 1.005–1.030)
pH: 5 (ref 5.0–8.0)

## 2023-08-08 LAB — COMPREHENSIVE METABOLIC PANEL
ALT: 23 U/L (ref 0–44)
AST: 24 U/L (ref 15–41)
Albumin: 3.5 g/dL (ref 3.5–5.0)
Alkaline Phosphatase: 46 U/L (ref 38–126)
Anion gap: 8 (ref 5–15)
BUN: 18 mg/dL (ref 8–23)
CO2: 19 mmol/L — ABNORMAL LOW (ref 22–32)
Calcium: 8.6 mg/dL — ABNORMAL LOW (ref 8.9–10.3)
Chloride: 102 mmol/L (ref 98–111)
Creatinine, Ser: 0.65 mg/dL (ref 0.61–1.24)
GFR, Estimated: >60 mL/min (ref >60)
Glucose, Bld: 121 mg/dL — ABNORMAL HIGH (ref 70–99)
Potassium: 4.1 mmol/L (ref 3.5–5.1)
Sodium: 129 mmol/L — ABNORMAL LOW (ref 135–145)
Total Bilirubin: 0.9 mg/dL (ref 0.0–1.2)
Total Protein: 6.3 g/dL — ABNORMAL LOW (ref 6.5–8.1)

## 2023-08-08 LAB — CBC WITH DIFFERENTIAL/PLATELET
Abs Immature Granulocytes: 0.05 10*3/uL (ref 0.00–0.07)
Basophils Absolute: 0 10*3/uL (ref 0.0–0.1)
Basophils Relative: 0 %
Eosinophils Absolute: 0.1 10*3/uL (ref 0.0–0.5)
Eosinophils Relative: 1 %
HCT: 36.3 % — ABNORMAL LOW (ref 39.0–52.0)
Hemoglobin: 12.4 g/dL — ABNORMAL LOW (ref 13.0–17.0)
Immature Granulocytes: 1 %
Lymphocytes Relative: 3 %
Lymphs Abs: 0.2 10*3/uL — ABNORMAL LOW (ref 0.7–4.0)
MCH: 29.5 pg (ref 26.0–34.0)
MCHC: 34.2 g/dL (ref 30.0–36.0)
MCV: 86.2 fL (ref 80.0–100.0)
Monocytes Absolute: 0.3 10*3/uL (ref 0.1–1.0)
Monocytes Relative: 4 %
Neutro Abs: 6.3 10*3/uL (ref 1.7–7.7)
Neutrophils Relative %: 91 %
Platelets: 220 10*3/uL (ref 150–400)
RBC: 4.21 MIL/uL — ABNORMAL LOW (ref 4.22–5.81)
RDW: 14 % (ref 11.5–15.5)
WBC: 6.9 10*3/uL (ref 4.0–10.5)
nRBC: 0 % (ref 0.0–0.2)

## 2023-08-08 LAB — I-STAT CG4 LACTIC ACID, ED: Lactic Acid, Venous: 1 mmol/L (ref 0.5–1.9)

## 2023-08-08 NOTE — ED Triage Notes (Signed)
 Sob for a few days, pt was sent by UC for blood cultures and further evaluation for pneumonia. Intermittent confusion. Pt fell on Thursday and has bruising to left hand, right arm has bruising and is being treated for cellulitis. Pt was on oxygen  at UC, RA sat in triage is 100%

## 2023-08-08 NOTE — ED Provider Notes (Signed)
 [place] EMERGENCY DEPARTMENT AT [facility] Provider Note   CSN: [id number] Arrival date & time: 08/08/23  1600     History  Chief Complaint  Patient presents with   Shortness of Breath    [name] Holder is a 68 y.o. male.  HPI Presents with his wife who assists with the history.  He presents with concern for fatigue, right elbow erythema, left hand pain.  Patient went urgent care and was sent here.  He has multiple medical problems including malignancy for which he is receiving therapy.  Over the past day he has had some degree of weakness, had a fall 2 days ago as well.  The fall resulted in multiple abrasions to the right elbow, contusion of the left hand.  No head trauma, no loss of consciousness.  With erythema around the lacerations patient started Bactrim  yesterday and has taken 3 doses.  In the interval the erythema about the right elbow has diminished substantially according to him and his wife.  He continues to have some discomfort about the left wrist as well as generalized discomfort and weakness.    Home Medications Prior to Admission medications   Medication Sig Start Date End Date Taking? Authorizing Provider  abiraterone  acetate (ZYTIGA ) 250 MG tablet Take 4 tablets (1,000 mg total) by mouth daily. 02/11/23   Lanny Callander, MD  acetaminophen  (TYLENOL ) 325 MG tablet Take 325 mg by mouth daily as needed for fever.    [provider]  albuterol  (PROVENTIL ) (2.5 MG/3ML) 0.083% nebulizer solution Take 3 mLs (2.5 mg total) by nebulization every 6 (six) hours as needed for wheezing or shortness of breath. 05/13/23   Icard, Adine CROME, DO  albuterol  (VENTOLIN  HFA) 108 (90 Base) MCG/ACT inhaler INHALE 2 PUFFS EVERY 6 HOURS AS NEEDED FOR WHEEZING OR SHORTNESS OF BREATH 01/11/23   Icard, Adine L, DO  azelastine  (ASTELIN ) 0.1 % nasal spray USE 2 SPRAYS IN EACH NOSTRIL EVERY MORNING AND EVERY EVENING 05/10/23   Joshua Debby CROME, MD  CALCIUM -VITAMIN D  PO Take 1  tablet by mouth 2 (two) times daily.     [provider]  carvedilol  (COREG ) 3.125 MG tablet TAKE ONE TABLET TWICE DAILY WITH FOOD 07/20/23   Joshua Debby CROME, MD  denosumab  (PROLIA ) 60 MG/ML SOSY injection Inject 60 mg into the skin every 6 (six) months.    [provider]  DULoxetine  (CYMBALTA ) 20 MG capsule TAKE ONE CAPSULE DAILY 07/07/23   Burton, Lacie K, NP  Famotidine  (PEPCID  AC PO) Take 1 tablet by mouth at bedtime.    [provider]  fluticasone  (FLONASE ) 50 MCG/ACT nasal spray USE 2 SPRAYS IN EACH NOSTRIL ONCE DAILY 05/10/23   Joshua Debby CROME, MD  furosemide  (LASIX ) 20 MG tablet TAKE ONE TABLET TWICE DAILY 05/10/23   Joshua Debby CROME, MD  leuprolide  (LUPRON ) 11.25 MG injection Inject 11.25 mg into the muscle every 3 (three) months.    [provider]  lubiprostone  (AMITIZA ) 24 MCG capsule TAKE ONE CAPSULE TWICE DAILY WITH MEAL(S) 05/10/23   Joshua Debby CROME, MD  meclizine  (ANTIVERT ) 12.5 MG tablet Take 1 tablet (12.5 mg total) by mouth 3 (three) times daily as needed for dizziness. 05/17/23   Joshua Debby CROME, MD  Multiple Vitamins-Minerals (CENTRUM SILVER 50+MEN PO) Take 1 tablet by mouth daily.    [provider]  mupirocin  ointment (BACTROBAN ) 2 % Apply 1 Application topically 2 (two) times daily. 08/07/23   Fleming, Zelda W, NP  NARCAN  4 MG/0.1ML  LIQD nasal spray kit Place 0.4 mg into the nose once as needed (overdose).  08/18/18   [provider]  olmesartan  (BENICAR ) 20 MG tablet TAKE ONE TABLET DAILY 06/02/23   Joshua Debby CROME, MD  ondansetron  (ZOFRAN ) 4 MG tablet Take 1 tablet (4 mg total) by mouth every 6 (six) hours as needed for nausea. 09/06/22   Sherrill Cable Latif, DO  oxyCODONE  (OXY IR/ROXICODONE ) 5 MG immediate release tablet Take 1 tablet (5 mg) by mouth every 4 hours as needed for severe pain. 06/29/23   Lanny Callander, MD  polyethylene glycol powder (GLYCOLAX /MIRALAX ) 17 GM/SCOOP powder Take 17 g (1 capful) by mouth daily as  directed. Patient not taking: Reported on 07/26/2023 06/05/21   Amadeo Windell SAILOR, MD  predniSONE  (DELTASONE ) 5 MG tablet TAKE 1 TABLET 2 TIMES A DAY 04/05/23   Lanny Callander, MD  rosuvastatin  (CRESTOR ) 10 MG tablet Take 1 tablet (10 mg total) by mouth daily. Patient not taking: Reported on 07/26/2023 05/24/23   Joshua Debby CROME, MD  sodium chloride  1 g tablet Take 1 g by mouth 2 (two) times daily with a meal.    [provider]  solifenacin (VESICARE) 5 MG tablet Take 5 mg by mouth daily. 08/11/19   [provider]  Specialty Vitamins Products (VISTA ADVANCED CAROTENOID) CAPS Take 1 capsule by mouth every evening.    [provider]  sulfamethoxazole -trimethoprim  (BACTRIM  DS) 800-160 MG tablet Take 1 tablet by mouth 2 (two) times daily for 7 days. 08/07/23 08/14/23  Theotis Haze ORN, NP  TRELEGY ELLIPTA  100-62.5-25 MCG/ACT AEPB INHALE 1 PUFF INTO LUNGS DAILY 05/21/23   Icard, Adine CROME, DO  vitamin C (ASCORBIC ACID ) 500 MG tablet Take 500 mg by mouth every evening.    [provider]  vitamin E  180 MG (400 UNITS) capsule Take 400 Units by mouth every evening.    [provider]      Allergies    Amlodipine  and Tetracycline    Review of Systems   Review of Systems  Physical Exam Updated Vital Signs BP (!) 130/97 (BP Location: Right Arm)   Pulse 82   Temp 98.2 F (36.8 C) (Oral)   Resp 19   Ht 5' 7 (1.702 m)   Wt 99.8 kg   SpO2 98%   BMI 34.46 kg/m  Physical Exam Vitals and nursing note reviewed.  Constitutional:      General: He is not in acute distress.    Appearance: He is not toxic-appearing.  HENT:     Head: Normocephalic and atraumatic.  Eyes:     Conjunctiva/sclera: Conjunctivae normal.  Cardiovascular:     Rate and Rhythm: Normal rate and regular rhythm.  Pulmonary:     Effort: Pulmonary effort is normal. No respiratory distress.     Breath sounds: No stridor.  Abdominal:     General: There is no distension.  Musculoskeletal:        Arms:  Skin:    General: Skin is warm and dry.  Neurological:     Mental Status: He is alert and oriented to person, place, and time.     ED Results / Procedures / Treatments   Labs (all labs ordered are listed, but only abnormal results are displayed) Labs Reviewed  COMPREHENSIVE METABOLIC PANEL - Abnormal; Notable for the following components:      Result Value   Sodium 129 (*)    CO2 19 (*)    Glucose, Bld 121 (*)    Calcium   8.6 (*)    Total Protein 6.3 (*)    All other components within normal limits  CBC WITH DIFFERENTIAL/PLATELET - Abnormal; Notable for the following components:   RBC 4.21 (*)    Hemoglobin 12.4 (*)    HCT 36.3 (*)    Lymphs Abs 0.2 (*)    All other components within normal limits  CULTURE, BLOOD (SINGLE)  URINALYSIS, W/ REFLEX TO CULTURE (INFECTION SUSPECTED)  I-STAT CG4 LACTIC ACID, ED  I-STAT CG4 LACTIC ACID, ED    EKG EKG Interpretation Date/Time:  Sunday August 08 2023 16:12:45 EST Ventricular Rate:  88 PR Interval:  158 QRS Duration:  90 QT Interval:  368 QTC Calculation: 438 R Axis:   58  Text Interpretation: Sinus rhythm Atrial premature complex Artifact Confirmed by Garrick Charleston [phone]) on 08/08/2023 4:52:18 PM  Radiology DG Hand 2 View Left Result Date: 08/08/2023 CLINICAL DATA:  Left hand bruising after fall EXAM: LEFT HAND - 2 VIEW COMPARISON:  None Available. FINDINGS: Acute mildly displaced fracture of the second metacarpal diaphysis with shortening. Additional fracture through the base of the ring finger distal phalanx with minimal displacement. No additional fractures are identified. Mild-to-moderate degenerative changes. Mild soft tissue swelling. IMPRESSION: 1. Acute mildly displaced fracture of the second metacarpal diaphysis. 2. Acute minimally displaced fracture through the base of the ring finger distal phalanx. Electronically Signed   By: Mabel Converse D.O.   On: 08/08/2023 17:52   DG Chest 2 View Result Date:  08/08/2023 CLINICAL DATA:  Shortness of breath.  Confusion. EXAM: CHEST - 2 VIEW COMPARISON:  Chest x-ray dated September 06, 2022. FINDINGS: The heart size and mediastinal contours are within normal limits. Normal pulmonary vascularity. Chronic mild bibasilar scarring again noted. No focal consolidation, pleural effusion, or pneumothorax. No acute osseous abnormality. Multiple chronic thoracolumbar compression deformities, several of which have had cement augmentation. IMPRESSION: 1. No acute cardiopulmonary disease. Electronically Signed   By: Elsie ONEIDA Shoulder M.D.   On: 08/08/2023 17:00    Procedures Procedures    Medications Ordered in ED Medications - No data to display  ED Course/ Medical Decision Making/ A&P                                 Medical Decision Making Adult male with ongoing therapy for prostate cancer presents with weakness in the context of fall that occurred a few days ago, with likely cellulitis given his physical exam.  Concern for cellulitis, bacteremia, sepsis, pneumonia, electrolyte abnormalities.  Patient had labs monitoring x-ray. Cardiac 85 sinus normal Pulse ox 99% room air normal  Amount and/or Complexity of Data Reviewed Independent Historian: spouse External Data Reviewed: notes.    Details: Urgent care notes reviewed Labs: ordered. Decision-making details documented in ED Course. Radiology: ordered and independent interpretation performed. Decision-making details documented in ED Course. ECG/medicine tests: ordered and independent interpretation performed. Decision-making details documented in ED Course.   7:04 PM On repeat exam patient is awake, alert, speaking clearly.  He remains hemodynamically unremarkable, no oxygen  requirement.  No evidence of bacteremia, sepsis.  Patient does have mild hyponatremia which is not unusual for him, but no evidence for neurologic consequences.  Patient will continue sodium repletion at home. In regards the patient's  other concerns, the patient does have evidence for left hand fracture, left splint, will follow-up with hand surgery.  Patient has evidence for cellulitis on the right elbow, but again no evidence  for bacteremia, sepsis will continue Bactrim .  Patient will follow-up with outpatient clinicians.  He and wife are both comfortable with plan.        Final Clinical Impression(s) / ED Diagnoses Final diagnoses:  Cellulitis of right elbow  Closed fracture of left hand, initial encounter    Rx / DC Orders ED Discharge Orders     None         Garrick Charleston, MD 08/08/23 1904

## 2023-08-08 NOTE — Discharge Instructions (Addendum)
 With your cellulitis and hand fracture is important to follow-up with your outpatient care team.  Do not hesitate to return here for concerning changes in your condition. Otherwise, continue your Bactrim  and other prescribed medication.  Below is the interpretation from today's x-ray for hand surgery follow-up.: 1. Acute mildly displaced fracture of the second metacarpal diaphysis.

## 2023-08-08 NOTE — Progress Notes (Signed)
 Orthopedic Tech Progress Note Patient Details:  [name] Holder 09/28/1955 [id number]  Ortho Devices Type of Ortho Device: Post (short arm) splint Ortho Device/Splint Location: LUE Ortho Device/Splint Interventions: Ordered, Application, Adjustment   Post Interventions Patient Tolerated: Well Instructions Provided: Adjustment of device, Care of device  [name] Holder 08/08/2023, 7:21 PM

## 2023-08-09 ENCOUNTER — Other Ambulatory Visit: Payer: Self-pay | Admitting: Internal Medicine

## 2023-08-09 ENCOUNTER — Telehealth: Payer: Self-pay

## 2023-08-09 ENCOUNTER — Encounter: Payer: Self-pay | Admitting: Hematology

## 2023-08-09 DIAGNOSIS — K5903 Drug induced constipation: Secondary | ICD-10-CM

## 2023-08-09 DIAGNOSIS — I10 Essential (primary) hypertension: Secondary | ICD-10-CM

## 2023-08-09 NOTE — Transitions of Care (Post Inpatient/ED Visit) (Signed)
   08/09/2023  Name: [name] Holder MRN: [id number] DOB: [birth year]  Today's TOC FU Call Status: Today's TOC FU Call Status:: Unsuccessful Call (1st Attempt) Unsuccessful Call (1st Attempt) Date: 08/09/23  Attempted to reach the patient regarding the most recent Inpatient/ED visit.  Follow Up Plan: Additional outreach attempts will be made to reach the patient to complete the Transitions of Care (Post Inpatient/ED visit) call.   Signature Julian Lemmings, LPN [facility] Nurse Health Advisor Direct Dial [phone]

## 2023-08-10 NOTE — Transitions of Care (Post Inpatient/ED Visit) (Signed)
 08/10/2023  Name: [name] Holder MRN: [id number] DOB: 28-Jul-1955  Today's TOC FU Call Status: Today's TOC FU Call Status:: Successful TOC FU Call Completed Unsuccessful Call (1st Attempt) Date: 08/09/23 [facility] FU Call Complete Date: 08/10/23 Patient's Name and Date of Birth confirmed.  Transition Care Management Follow-up Telephone Call Date of Discharge: 08/08/23 Discharge Facility: [facility]) Type of Discharge: Emergency Department Reason for ED Visit: Other: (wrist fracture) How have you been since you were released from the hospital?: Better Any questions or concerns?: No  Items Reviewed: Did you receive and understand the discharge instructions provided?: Yes Medications obtained,verified, and reconciled?: Yes (Medications Reviewed) Any new allergies since your discharge?: No Dietary orders reviewed?: Yes Do you have support at home?: Yes People in Home: spouse  Medications Reviewed Today: Medications Reviewed Today     Reviewed by Emmitt Pan, LPN (Licensed Practical Nurse) on 08/10/23 at 1644  Med List Status: <None>   Medication Order Taking? Sig Documenting Provider Last Dose Status Informant  abiraterone  acetate (ZYTIGA ) 250 MG tablet [id number] No Take 4 tablets (1,000 mg total) by mouth daily. Lanny Callander, MD Taking Active   acetaminophen  (TYLENOL ) 325 MG tablet [id number] No Take 325 mg by mouth daily as needed for fever. [provider] Taking Active Spouse/Significant Other  albuterol  (PROVENTIL ) (2.5 MG/3ML) 0.083% nebulizer solution [id number] No Take 3 mLs (2.5 mg total) by nebulization every 6 (six) hours as needed for wheezing or shortness of breath. Brenna Adine CROME, DO Taking Active   albuterol  (VENTOLIN  HFA) 108 (90 Base) MCG/ACT inhaler [id number] No INHALE 2 PUFFS EVERY 6 HOURS AS NEEDED FOR WHEEZING OR SHORTNESS OF BREATH Icard, Bradley L, DO Taking Active   azelastine  (ASTELIN ) 0.1 % nasal spray [id number] No USE 2 SPRAYS IN EACH NOSTRIL  EVERY MORNING AND EVERY KARNA Joshua Debby CROME, MD Taking Active   CALCIUM -VITAMIN D  PO [id number] No Take 1 tablet by mouth 2 (two) times daily.  [provider] Taking Active Spouse/Significant Other  carvedilol  (COREG ) 3.125 MG tablet [id number] No TAKE ONE TABLET TWICE DAILY WITH FOOD Joshua Debby CROME, MD Taking Active   denosumab  (PROLIA ) 60 MG/ML SOSY injection [id number] No Inject 60 mg into the skin every 6 (six) months. [provider] Taking Active Spouse/Significant Other           Med Note LEONARDO SUZEN CROME [place] Sep 03, 2022  2:38 PM) Last dose 3 months ago  DULoxetine  (CYMBALTA ) 20 MG capsule [id number] No TAKE ONE CAPSULE DAILY Burton, Lacie K, NP Taking Active   Famotidine  (PEPCID  AC PO) [id number] No Take 1 tablet by mouth at bedtime. [provider] Taking Active Spouse/Significant Other  fluticasone  (FLONASE ) 50 MCG/ACT nasal spray [id number] No USE 2 SPRAYS IN EACH NOSTRIL ONCE DAILY Joshua Debby CROME, MD Taking Active   furosemide  (LASIX ) 20 MG tablet [id number]  TAKE ONE TABLET TWICE DAILY Joshua Debby CROME, MD  Active   leuprolide  (LUPRON ) 11.25 MG injection [id number] No Inject 11.25 mg into the muscle every 3 (three) months. [provider] Taking Active Spouse/Significant Other  lubiprostone  (AMITIZA ) 24 MCG capsule [id number]  TAKE ONE CAPSULE TWICE DAILY WITH MEAL(S) Joshua Debby CROME, MD  Active   meclizine  (ANTIVERT ) 12.5 MG tablet [id number] No Take 1 tablet (12.5 mg total) by mouth 3 (three) times daily as needed for dizziness. Joshua Debby CROME, MD Taking Active   Multiple Vitamins-Minerals (CENTRUM SILVER 50+MEN PO) [id number] No Take 1 tablet by mouth daily. [provider] Taking  Active Spouse/Significant Other  mupirocin  ointment (BACTROBAN ) 2 % [id number]  Apply 1 Application topically 2 (two) times daily. Theotis Haze ORN, NP  Active   NARCAN  4 MG/0.1ML LIQD nasal spray kit [id number] No Place 0.4 mg into the nose once as needed  (overdose).  [provider] Taking Active Spouse/Significant Other           Med Note WANETTA JACKQUELINE JULIANNA Sonjia Aug 15, 2019 12:28 PM) Has on hand/ never used  olmesartan  (BENICAR ) 20 MG tablet [id number] No TAKE ONE TABLET DAILY Joshua Debby CROME, MD Taking Active   ondansetron  (ZOFRAN ) 4 MG tablet [id number] No Take 1 tablet (4 mg total) by mouth every 6 (six) hours as needed for nausea. Sheikh, Alejandro Savannah, DO Taking Active   oxyCODONE  (OXY IR/ROXICODONE ) 5 MG immediate release tablet [id number] No Take 1 tablet (5 mg) by mouth every 4 hours as needed for severe pain. Lanny Callander, MD Taking Active   polyethylene glycol powder (GLYCOLAX /MIRALAX ) 17 GM/SCOOP powder [id number] No Take 17 g (1 capful) by mouth daily as directed.  Patient not taking: Reported on 07/26/2023   Amadeo Windell SAILOR, MD Not Taking Active Spouse/Significant Other  predniSONE  (DELTASONE ) 5 MG tablet [id number] No TAKE 1 TABLET 2 TIMES A DAY Feng, Yan, MD Taking Active   rosuvastatin  (CRESTOR ) 10 MG tablet [id number] No Take 1 tablet (10 mg total) by mouth daily.  Patient not taking: Reported on 07/26/2023   Joshua Debby CROME, MD Not Taking Active   sodium chloride  1 g tablet [id number] No Take 1 g by mouth 2 (two) times daily with a meal. [provider] Taking Active Spouse/Significant Other  solifenacin (VESICARE) 5 MG tablet [id number] No Take 5 mg by mouth daily. [provider] Taking Active Spouse/Significant Other  Specialty Vitamins Products (VISTA ADVANCED CAROTENOID) CAPS [id number] No Take 1 capsule by mouth every evening. [provider] Taking Active Spouse/Significant Other  sulfamethoxazole -trimethoprim  (BACTRIM  DS) 800-160 MG tablet [id number]  Take 1 tablet by mouth 2 (two) times daily for 7 days. Theotis Haze ORN, NP  Active   TRELEGY ELLIPTA  100-62.5-25 MCG/ACT AEPB [id number] No INHALE 1 PUFF INTO LUNGS DAILY Icard, Adine CROME, DO Taking Active   vitamin C (ASCORBIC ACID ) 500 MG tablet  [id number] No Take 500 mg by mouth every evening. [provider] Taking Active Spouse/Significant Other  vitamin E  180 MG (400 UNITS) capsule [id number] No Take 400 Units by mouth every evening. [provider] Taking Active Spouse/Significant Other            Home Care and Equipment/Supplies: Were Home Health Services Ordered?: NA Any new equipment or medical supplies ordered?: NA  Functional Questionnaire: Do you need assistance with bathing/showering or dressing?: Yes Do you need assistance with meal preparation?: Yes Do you need assistance with eating?: No Do you have difficulty maintaining continence: No Do you need assistance with getting out of bed/getting out of a chair/moving?: No Do you have difficulty managing or taking your medications?: No  Follow up appointments reviewed: PCP Follow-up appointment confirmed?: No MD Provider Line Number:[phone] Given: No Specialist Hospital Follow-up appointment confirmed?: Yes Date of Specialist follow-up appointment?: 08/12/23 Follow-Up Specialty Provider:: ortho Do you need transportation to your follow-up appointment?: No Do you understand care options if your condition(s) worsen?: Yes-patient verbalized understanding    SIGNATURE Julian Lemmings, LPN [facility] Nurse Health Advisor Direct Dial [phone]

## 2023-08-11 ENCOUNTER — Other Ambulatory Visit: Payer: Self-pay

## 2023-08-11 ENCOUNTER — Ambulatory Visit: Payer: Medicare Other | Admitting: Pulmonary Disease

## 2023-08-11 ENCOUNTER — Telehealth: Payer: Self-pay

## 2023-08-11 NOTE — Telephone Encounter (Signed)
 Patient called in stating he needs a refill of his oxycodone  5mg  to be sent to [facility] pharmacy for him to pick up on Thursday 08/12/2023 after his appointment with Dr. Maryalice Smaller.

## 2023-08-12 ENCOUNTER — Other Ambulatory Visit (HOSPITAL_COMMUNITY): Payer: Self-pay

## 2023-08-12 ENCOUNTER — Inpatient Hospital Stay: Payer: Medicare Other

## 2023-08-12 ENCOUNTER — Inpatient Hospital Stay: Payer: Medicare Other | Admitting: Hematology

## 2023-08-12 ENCOUNTER — Encounter: Payer: Self-pay | Admitting: Hematology

## 2023-08-12 ENCOUNTER — Other Ambulatory Visit: Payer: Self-pay

## 2023-08-12 ENCOUNTER — Inpatient Hospital Stay: Payer: Medicare Other | Attending: Hematology

## 2023-08-12 VITALS — BP 80/63 | HR 80 | Temp 97.9°F | Resp 18 | Ht 67.0 in | Wt 214.8 lb

## 2023-08-12 DIAGNOSIS — Z79899 Other long term (current) drug therapy: Secondary | ICD-10-CM | POA: Insufficient documentation

## 2023-08-12 DIAGNOSIS — C61 Malignant neoplasm of prostate: Secondary | ICD-10-CM

## 2023-08-12 DIAGNOSIS — C7951 Secondary malignant neoplasm of bone: Secondary | ICD-10-CM | POA: Diagnosis not present

## 2023-08-12 DIAGNOSIS — Z5111 Encounter for antineoplastic chemotherapy: Secondary | ICD-10-CM | POA: Diagnosis not present

## 2023-08-12 DIAGNOSIS — C7952 Secondary malignant neoplasm of bone marrow: Secondary | ICD-10-CM

## 2023-08-12 DIAGNOSIS — S62321A Displaced fracture of shaft of second metacarpal bone, left hand, initial encounter for closed fracture: Secondary | ICD-10-CM | POA: Diagnosis not present

## 2023-08-12 LAB — CMP (CANCER CENTER ONLY)
ALT: 24 U/L (ref 0–44)
AST: 17 U/L (ref 15–41)
Albumin: 3.8 g/dL (ref 3.5–5.0)
Alkaline Phosphatase: 50 U/L (ref 38–126)
Anion gap: 7 (ref 5–15)
BUN: 15 mg/dL (ref 8–23)
CO2: 23 mmol/L (ref 22–32)
Calcium: 9.3 mg/dL (ref 8.9–10.3)
Chloride: 103 mmol/L (ref 98–111)
Creatinine: 0.99 mg/dL (ref 0.61–1.24)
GFR, Estimated: >60 mL/min (ref >60)
Glucose, Bld: 107 mg/dL — ABNORMAL HIGH (ref 70–99)
Potassium: 3.9 mmol/L (ref 3.5–5.1)
Sodium: 133 mmol/L — ABNORMAL LOW (ref 135–145)
Total Bilirubin: 0.5 mg/dL (ref 0.0–1.2)
Total Protein: 6.2 g/dL — ABNORMAL LOW (ref 6.5–8.1)

## 2023-08-12 LAB — CBC WITH DIFFERENTIAL (CANCER CENTER ONLY)
Abs Immature Granulocytes: 0.03 10*3/uL (ref 0.00–0.07)
Basophils Absolute: 0.1 10*3/uL (ref 0.0–0.1)
Basophils Relative: 1 %
Eosinophils Absolute: 0.1 10*3/uL (ref 0.0–0.5)
Eosinophils Relative: 1 %
HCT: 35.8 % — ABNORMAL LOW (ref 39.0–52.0)
Hemoglobin: 12.4 g/dL — ABNORMAL LOW (ref 13.0–17.0)
Immature Granulocytes: 0 %
Lymphocytes Relative: 26 %
Lymphs Abs: 2.2 10*3/uL (ref 0.7–4.0)
MCH: 29 pg (ref 26.0–34.0)
MCHC: 34.6 g/dL (ref 30.0–36.0)
MCV: 83.8 fL (ref 80.0–100.0)
Monocytes Absolute: 1.1 10*3/uL — ABNORMAL HIGH (ref 0.1–1.0)
Monocytes Relative: 13 %
Neutro Abs: 5 10*3/uL (ref 1.7–7.7)
Neutrophils Relative %: 59 %
Platelet Count: 295 10*3/uL (ref 150–400)
RBC: 4.27 MIL/uL (ref 4.22–5.81)
RDW: 13.9 % (ref 11.5–15.5)
WBC Count: 8.5 10*3/uL (ref 4.0–10.5)
nRBC: 0 % (ref 0.0–0.2)

## 2023-08-12 LAB — PSA: PSA: 3.4

## 2023-08-12 MED ORDER — SODIUM CHLORIDE 0.9 % IV SOLN: Freq: Once | INTRAVENOUS | Status: DC

## 2023-08-12 MED ORDER — OXYCODONE HCL 5 MG PO TABS
5.0000 mg | ORAL_TABLET | ORAL | 0 refills | Status: DC | PRN
  Filled 2023-08-12: qty 180, 30d supply, fill #0

## 2023-08-12 MED ORDER — DENOSUMAB 60 MG/ML ~~LOC~~ SOSY
60.0000 mg | PREFILLED_SYRINGE | Freq: Once | SUBCUTANEOUS | Status: AC
Start: 2023-08-12 — End: 2023-08-12
  Administered 2023-08-12: 60 mg via SUBCUTANEOUS
  Filled 2023-08-12: qty 1

## 2023-08-12 MED ORDER — LEUPROLIDE ACETATE (3 MONTH) 22.5 MG ~~LOC~~ KIT
22.5000 mg | PACK | Freq: Once | SUBCUTANEOUS | Status: AC
Start: 2023-08-12 — End: 2023-08-12
  Administered 2023-08-12: 22.5 mg via SUBCUTANEOUS
  Filled 2023-08-12: qty 22.5

## 2023-08-12 MED ORDER — SODIUM CHLORIDE 0.9 % IV SOLN
INTRAVENOUS | Status: AC
Start: 2023-08-12 — End: 2023-08-12

## 2023-08-12 NOTE — Progress Notes (Signed)
Per Dr. Mosetta Putt OK to infuse IVFs at 750 mL/hr today

## 2023-08-12 NOTE — Patient Instructions (Signed)

## 2023-08-12 NOTE — Assessment & Plan Note (Signed)
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  -He developed castration resistant in 2018.  His PSA was up to 11.3 at that time.  -He is status post T10, T12, kyphoplasty (osteoprosis related) completed on January 22 of 2020.  -he has been on Zytiga 1000 mg daily with prednisone at 5 mg daily started in 2018, Eligard 22.5 mg every 3 months and Prolia 60 mg every 6 months.  -Tolerating treatment well. His PSA has been slowly trending up with latest 2.2 in July 2024, with doubling time more than 6 months.  Will continue current therapy.

## 2023-08-12 NOTE — Progress Notes (Signed)
[facility]   [phone] [phone]   Clinic Follow up Note   Patient Care Team: Etta Grandchild, MD as PCP - General (Internal Medicine) Kathyrn Sheriff, [facility] (Inactive) as Pharmacist (Pharmacist) Conley Rolls, [place], [place] as Referring Physician (Optometry) [name], MD as Attending Physician (Hematology and Oncology)  Date of Service:  08/12/2023  CHIEF COMPLAINT: f/u of prostate cancer  CURRENT THERAPY:  Eligard every 3 months Zytiga and prednisone Prolia injection every 6 months  Oncology History   Prostate cancer (HCC) -stage IV with node metastasis, castration resistant, Guardant 360 (-) in 2022 -diagnosed in 2014 with lymphadenopathy, Gleason score 7 and PSA of 23.  -He developed castration resistant in 2018.  His PSA was up to 11.3 at that time.  -He is status post T10, T12, kyphoplasty (osteoprosis related) completed on January 22 of 2020.  -he has been on Zytiga 1000 mg daily with prednisone at 5 mg daily started in 2018, Eligard 22.5 mg every 3 months and Prolia 60 mg every 6 months.  -Tolerating treatment well. His PSA has been slowly trending up with latest 2.2 in July 2024, with doubling time more than 6 months.  Will continue current therapy.   Assessment and Plan    Metastatic Prostate Cancer Currently on Zytiga without issues. Due for Eligard and Xgeva injections. Discussed the importance of continuing these medications to manage cancer progression and reduce skeletal-related events. No new side effects reported. - Administer Eligard injection - Administer Xgeva injection  SIADH (Syndrome of Inappropriate Antidiuretic Hormone Secretion) Recurrent hospitalizations for hyponatremia from SIADH before. Last sodium level was 129 mEq/L on August 08, 2023. Current sodium level is 133 mEq/L. Symptoms include hypotension and decreased communication. Managed with 2 grams of sodium chloride daily and Gatorade. Discussed the need for close  monitoring of sodium levels and potential hospitalization if levels drop significantly. - Hold antihypertensive medications for a few days - Check blood pressure before taking medications - Increase fluid intake with Gatorade, Pedialyte, and Liquid IV - Administer IV fluids if needed - Recheck sodium levels next week  Hypotension Blood pressure readings of 90/63 mmHg sitting and 80/63 mmHg standing. No dizziness reported. Likely related to SIADH and hyponatremia. Discussed the importance of monitoring blood pressure and potential risks of hypotension, including falls and decreased organ perfusion. - Hold antihypertensive medications for a few days - Increase fluid intake with Gatorade, Pedialyte, and Liquid IV - Administer IV fluids if needed - Recheck blood pressure before taking medications  Second Metacarpal Fracture Sustained from a fall last Thursday. Scheduled for orthopedic follow-up today. Discussed potential need for splinting or casting and expected healing time. - Attend orthopedic appointment for x-ray and possible splinting  Cellulitis of right arm from fall  Cellulitis in the right forearm secondary to fall with multiple abrasions. Currently on Bactrim with two doses remaining. Improvement noted. Discussed the importance of completing the antibiotic course to prevent recurrence. - Complete Bactrim course  Chronic Obstructive Pulmonary Disease (COPD) COPD and asthma. Former 30-year smoker. Uses supplemental oxygen as needed. No current respiratory distress or wheezing. Discussed the importance of avoiding respiratory irritants and adhering to COPD management plan. - Continue current management - Ensure oxygen is available as needed  Chronic Pain Managed with oxycodone 5 mg, typically taken four times a day. Requested refill. Discussed the importance of adhering to the prescribed dosage to manage pain effectively and avoid potential side effects. - Call in oxycodone  prescription to [facility]  General Health Maintenance Routine follow-up with primary care provider scheduled for Tuesday. Discussed the importance of regular check-ups and routine labs to monitor overall health. - Follow up with PCP on Tuesday for routine labs and check-up  Plan -lab reviewed  - Return for IV fluids administration after orthopedic appointment - Check sodium levels next week with PCP -Will proceed Eligard and Xgeva injection today -Lab and follow-up with injection in 3 months        SUMMARY OF ONCOLOGIC HISTORY: Oncology History Overview Note   Cancer Staging  Secondary malignant neoplasm of bone and bone marrow (HCC) Staging form: Bone - Appendicular Skeleton, Trunk, Skull, and Facial Bones, AJCC 8th Edition - Clinical: Stage Unknown (cTX, cNX, pM1) - Signed by Benjiman Core, MD on 12/10/2020     Secondary malignant neoplasm of bone and bone marrow (HCC)  08/06/2018 Initial Diagnosis   Secondary malignant neoplasm of bone and bone marrow (HCC)   12/10/2020 Cancer Staging   Staging form: Bone - Appendicular Skeleton, Trunk, Skull, and Facial Bones, AJCC 8th Edition - Clinical: Stage Unknown (cTX, cNX, pM1) - Signed by Benjiman Core, MD on 12/10/2020   Prostate cancer (HCC)  09/16/2021 Initial Diagnosis   Prostate cancer [facility])      Discussed the use of AI scribe software for clinical note transcription with the patient, who gave verbal consent to proceed.  History of Present Illness   The patient, a 68 year old male with a history of metastatic prostate cancer, Syndrome of Inappropriate Antidiuretic Hormone Secretion (SIADH), COPD, and asthma, presents with concerns about low blood pressure. The patient's caregiver reports that the patient's blood pressure was 90/63 mmHg when sitting and dropped to 80/63 mmHg upon standing. The patient denies experiencing dizziness. The caregiver notes that similar episodes have occurred in the past when  the patient's sodium levels drop due to SIADH, leading to hospitalizations. The patient's most recent hospitalization for low sodium was last spring.  In addition to the blood pressure concerns, the patient recently experienced a fall resulting in a second metacarpal fracture. The fall also resulted in multiple abrasions and a cellulitis infection in the left forearm, for which the patient is currently taking Bactrim. The patient denies any current pain unless attempting to use the injured hand.  The patient also reports difficulty starting and stopping a urinary stream, a long-standing issue attributed to prostate problems. The patient's caregiver notes that the patient has been quieter than usual, raising concerns about potential changes in the patient's condition.         All other systems were reviewed with the patient and are negative.  MEDICAL HISTORY:  Past Medical History:  Diagnosis Date   Allergic rhinitis    At risk for sleep apnea    STOP-BANG SCORE = 5    (routed to pt's pcp 09-08-2018)   Bladder cancer (HCC) dx'd 2020   Chronic constipation    Chronic low back pain    Chronic pain    DOE (dyspnea on exertion)    History of kidney stones    History of recent pneumonia 08/10/2018   acute bronchopneumonia;  follow-up cxr 08-28-2018 in epic   History of vertebral compression fracture    T10 and T12 s/p kyphoplasty   Hx of colonic polyps    Hyperplasia of prostate with lower urinary tract symptoms (LUTS)    Hypertension    Mild persistent asthma    followed by pcp   Numbness in both legs  secondary to a fall, per pt has had work-up and couldn't find anything   Osteopenia    Prostate cancer metastatic to pelvis [facility]) urologist-- dr borden/  oncologist -- dr Clelia Croft   dx 2014 advanced prostate cancer with pelvid adenopathy,  Stage T2c, Gleason 7;  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   Wears glasses     SURGICAL HISTORY: Past Surgical History:  Procedure Laterality Date    COLONOSCOPY     IR KYPHO EA ADDL LEVEL THORACIC OR LUMBAR  08/17/2018   IR KYPHO EA ADDL LEVEL THORACIC OR LUMBAR  08/26/2018   IR KYPHO LUMBAR INC FX REDUCE BONE BX UNI/BIL CANNULATION INC/IMAGING  08/26/2018   IR KYPHO THORACIC WITH BONE BIOPSY  08/17/2018   IR RADIOLOGIST EVAL & MGMT  08/11/2018   IR RADIOLOGIST EVAL & MGMT  08/24/2018   LYMPHADENECTOMY Left 01/05/2013   Procedure: ROBOTIC LYMPHADENECTOMY;  Surgeon: Crecencio Mc, MD;  Location: [facility];  Service: Urology;  Laterality: Left;   PROSTATE BIOPSY     11/12 positive biopsies   ROTATOR CUFF REPAIR Left 2012   TRANSURETHRAL RESECTION OF BLADDER TUMOR N/A 09/15/2018   Procedure: TRANSURETHRAL RESECTION OF BLADDER TUMOR (TURBT)WITH CYSTOSCOPY/ POST OPERATIVE INSTILLATION OF GEMCITABINE;  Surgeon: Heloise Purpura, MD;  Location: [facility];  Service: Urology;  Laterality: N/A;  GENERAL ANESTHESIA WITH PARALYSIS   UMBILICAL HERNIA REPAIR  09-02-2001   dr t. price @[facility]     I have reviewed the social history and family history with the patient and they are unchanged from previous note.  ALLERGIES:  is allergic to amlodipine and tetracycline.  MEDICATIONS:  Current Outpatient Medications  Medication Sig Dispense Refill   abiraterone acetate (ZYTIGA) 250 MG tablet Take 4 tablets (1,000 mg total) by mouth daily. 120 tablet 5   acetaminophen (TYLENOL) 325 MG tablet Take 325 mg by mouth daily as needed for fever.     albuterol (PROVENTIL) (2.5 MG/3ML) 0.083% nebulizer solution Take 3 mLs (2.5 mg total) by nebulization every 6 (six) hours as needed for wheezing or shortness of breath. 360 mL 2   albuterol (VENTOLIN HFA) 108 (90 Base) MCG/ACT inhaler INHALE 2 PUFFS EVERY 6 HOURS AS NEEDED FOR WHEEZING OR SHORTNESS OF BREATH 18 g 3   azelastine (ASTELIN) 0.1 % nasal spray USE 2 SPRAYS IN EACH NOSTRIL EVERY MORNING AND EVERY EVENING 90 mL 1   CALCIUM-VITAMIN D PO Take 1 tablet by mouth 2 (two) times daily.      carvedilol (COREG) 3.125 MG tablet TAKE ONE  TABLET TWICE DAILY WITH FOOD 180 tablet 0   denosumab (PROLIA) 60 MG/ML SOSY injection Inject 60 mg into the skin every 6 (six) months.     DULoxetine (CYMBALTA) 20 MG capsule TAKE ONE CAPSULE DAILY 30 capsule 1   Famotidine (PEPCID AC PO) Take 1 tablet by mouth at bedtime.     fluticasone (FLONASE) 50 MCG/ACT nasal spray USE 2 SPRAYS IN EACH NOSTRIL ONCE DAILY 48 g 1   furosemide (LASIX) 20 MG tablet TAKE ONE TABLET TWICE DAILY 180 tablet 0   leuprolide (LUPRON) 11.25 MG injection Inject 11.25 mg into the muscle every 3 (three) months.     lubiprostone (AMITIZA) 24 MCG capsule TAKE ONE CAPSULE TWICE DAILY WITH MEAL(S) 180 capsule 0   meclizine (ANTIVERT) 12.5 MG tablet Take 1 tablet (12.5 mg total) by mouth 3 (three) times daily as needed for dizziness. 270 tablet 0   Multiple Vitamins-Minerals (CENTRUM SILVER 50+MEN PO) Take 1 tablet by mouth daily.  mupirocin ointment (BACTROBAN) 2 % Apply 1 Application topically 2 (two) times daily. 30 g 1   NARCAN 4 MG/0.1ML LIQD nasal spray kit Place 0.4 mg into the nose once as needed (overdose).      olmesartan (BENICAR) 20 MG tablet TAKE ONE TABLET DAILY 90 tablet 0   ondansetron (ZOFRAN) 4 MG tablet Take 1 tablet (4 mg total) by mouth every 6 (six) hours as needed for nausea. 20 tablet 0   oxyCODONE (OXY IR/ROXICODONE) 5 MG immediate release tablet Take 1 tablet (5 mg) by mouth every 4 hours as needed for severe pain. 180 tablet 0   polyethylene glycol powder (GLYCOLAX/MIRALAX) 17 GM/SCOOP powder Take 17 g (1 capful) by mouth daily as directed. (Patient not taking: Reported on 07/26/2023) 510 g 6   predniSONE (DELTASONE) 5 MG tablet TAKE 1 TABLET 2 TIMES A DAY 90 tablet 3   rosuvastatin (CRESTOR) 10 MG tablet Take 1 tablet (10 mg total) by mouth daily. (Patient not taking: Reported on 07/26/2023) 90 tablet 1   sodium chloride 1 g tablet Take 1 g by mouth 2 (two) times daily with a meal.     solifenacin (VESICARE) 5 MG tablet Take 5 mg by mouth daily.      Specialty Vitamins Products (VISTA ADVANCED CAROTENOID) CAPS Take 1 capsule by mouth every evening.     sulfamethoxazole-trimethoprim (BACTRIM DS) 800-160 MG tablet Take 1 tablet by mouth 2 (two) times daily for 7 days. 14 tablet 0   TRELEGY ELLIPTA 100-62.5-25 MCG/ACT AEPB INHALE 1 PUFF INTO LUNGS DAILY 120 each 3   vitamin C (ASCORBIC ACID) 500 MG tablet Take 500 mg by mouth every evening.     vitamin E 180 MG (400 UNITS) capsule Take 400 Units by mouth every evening.     No current facility-administered medications for this visit.    PHYSICAL EXAMINATION: ECOG PERFORMANCE STATUS: 2 - Symptomatic, <50% confined to bed  Vitals:   08/12/23 1135 08/12/23 1136  BP: 90/63 (!) 80/63  Pulse: 80   Resp:    Temp:    SpO2:     Wt Readings from Last 3 Encounters:  08/12/23 214 lb 12.8 oz (97.4 kg)  08/08/23 220 lb (99.8 kg)  07/26/23 218 lb (98.9 kg)     GENERAL:alert, no distress and comfortable SKIN: skin color, texture, turgor are normal, no rashes, multiple healing skin ulcers on the right forearm, left forearm and wrist in a case due to fracture  EYES: normal, Conjunctiva are pink and non-injected, sclera clear NECK: supple, thyroid normal size, non-tender, without nodularity LYMPH:  no palpable lymphadenopathy in the cervical, axillary  LUNGS: clear to auscultation and percussion with normal breathing effort HEART: regular rate & rhythm and no murmurs and no lower extremity edema ABDOMEN:abdomen soft, non-tender and normal bowel sounds Musculoskeletal:no cyanosis of digits and no clubbing  NEURO: alert & oriented x 3 with fluent speech, no focal motor/sensory deficits    LABORATORY DATA:  I have reviewed the data as listed    Latest Ref Rng & Units 08/12/2023   11:04 AM 08/08/2023    4:30 PM 05/13/2023    9:38 AM  CBC  WBC 4.0 - 10.5 K/uL 8.5  6.9  11.0   Hemoglobin 13.0 - 17.0 g/dL 59.5  63.8  75.6   Hematocrit 39.0 - 52.0 % 35.8  36.3  37.6   Platelets 150 - 400  K/uL 295  220  307         Latest Ref  Rng & Units 08/12/2023   11:04 AM 08/08/2023    4:30 PM 05/13/2023    9:38 AM  CMP  Glucose 70 - 99 mg/dL 956  213  086   BUN 8 - 23 mg/dL 15  18  17    Creatinine 0.61 - 1.24 mg/dL 5.78  4.69  6.29   Sodium 135 - 145 mmol/L 133  129  133   Potassium 3.5 - 5.1 mmol/L 3.9  4.1  4.2   Chloride 98 - 111 mmol/L 103  102  101   CO2 22 - 32 mmol/L 23  19  24    Calcium 8.9 - 10.3 mg/dL 9.3  8.6  9.8   Total Protein 6.5 - 8.1 g/dL 6.2  6.3  6.5   Total Bilirubin 0.0 - 1.2 mg/dL 0.5  0.9  0.5   Alkaline Phos 38 - 126 U/L 50  46  47   AST 15 - 41 U/L 17  24  12    ALT 0 - 44 U/L 24  23  12        RADIOGRAPHIC STUDIES: I have personally reviewed the radiological images as listed and agreed with the findings in the report. No results found.    No orders of the defined types were placed in this encounter.  All questions were answered. The patient knows to call the clinic with any problems, questions or concerns. No barriers to learning was detected. The total time spent in the appointment was 30 minutes.     [name], MD 08/12/2023

## 2023-08-12 NOTE — Patient Instructions (Signed)
Leuprolide Suspension for Injection (Prostate Cancer) What is this medication? LEUPROLIDE (loo PROE lide) reduces the symptoms of prostate cancer. It works by decreasing levels of the hormone testosterone in the body. This prevents prostate cancer cells from spreading or growing. This medicine may be used for other purposes; ask your health care provider or pharmacist if you have questions. COMMON BRAND NAME(S): Eligard, Lupron Depot, Lutrate Depot What should I tell my care team before I take this medication? They need to know if you have any of these conditions: Diabetes Heart disease Heart failure High or low levels of electrolytes, such as magnesium, potassium, or sodium in your blood Irregular heartbeat or rhythm Seizures An unusual or allergic reaction to leuprolide, other medications, foods, dyes, or preservatives Pregnant or trying to get pregnant Breast-feeding How should I use this medication? This medication is injected under the skin or into a muscle. It is given by your care team in a hospital or clinic setting. Talk to your care team about the use of this medication in children. Special care may be needed. Overdosage: If you think you have taken too much of this medicine contact a poison control center or emergency room at once. NOTE: This medicine is only for you. Do not share this medicine with others. What if I miss a dose? Keep appointments for follow-up doses. It is important not to miss your dose. Call your care team if you are unable to keep an appointment. What may interact with this medication? Do not take this medication with any of the following: Cisapride Dronedarone Ketoconazole Levoketoconazole Pimozide Thioridazine This medication may also interact with the following: Other medications that cause heart rhythm changes This list may not describe all possible interactions. Give your health care provider a list of all the medicines, herbs, non-prescription  drugs, or dietary supplements you use. Also tell them if you smoke, drink alcohol, or use illegal drugs. Some items may interact with your medicine. What should I watch for while using this medication? Visit your care team for regular checks on your progress. Tell your care team if your symptoms do not start to get better or if they get worse. This medication may increase blood sugar. The risk may be higher in patients who already have diabetes. Ask your care team what you can do to lower the risk of diabetes while taking this medication. This medication may cause infertility. Talk to your care team if you are concerned about your fertility. Heart attacks and strokes have been reported with the use of this medication. Get emergency help if you develop signs or symptoms of a heart attack or stroke. Talk to your care team about the risks and benefits of this medication. What side effects may I notice from receiving this medication? Side effects that you should report to your care team as soon as possible: Allergic reactions--skin rash, itching, hives, swelling of the face, lips, tongue, or throat Heart attack--pain or tightness in the chest, shoulders, arms, or jaw, nausea, shortness of breath, cold or clammy skin, feeling faint or lightheaded Heart rhythm changes--fast or irregular heartbeat, dizziness, feeling faint or lightheaded, chest pain, trouble breathing High blood sugar (hyperglycemia)--increased thirst or amount of urine, unusual weakness or fatigue, blurry vision New or worsening seizures Redness, blistering, peeling, or loosening of the skin, including inside the mouth Stroke--sudden numbness or weakness of the face, arm, or leg, trouble speaking, confusion, trouble walking, loss of balance or coordination, dizziness, severe headache, change in vision Swelling and pain  of the tumor site or lymph nodes Side effects that usually do not require medical attention (report these to your care  team if they continue or are bothersome): Change in sex drive or performance Hot flashes Joint pain Pain, redness, or irritation at injection site Swelling of the ankles, hands, or feet Unusual weakness or fatigue This list may not describe all possible side effects. Call your doctor for medical advice about side effects. You may report side effects to FDA at 1-800-FDA-1088. Where should I keep my medication? This medication is given in a hospital or clinic. It will not be stored at home. NOTE: This sheet is a summary. It may not cover all possible information. If you have questions about this medicine, talk to your doctor, pharmacist, or health care provider.  2024 Elsevier/Gold Standard (2022-11-13 00:00:00) Denosumab Injection (Osteoporosis) What is this medication? DENOSUMAB (den oh SUE mab) prevents and treats osteoporosis. It works by Interior and spatial designer stronger and less likely to break (fracture). It is a monoclonal antibody. This medicine may be used for other purposes; ask your health care provider or pharmacist if you have questions. COMMON BRAND NAME(S): Prolia What should I tell my care team before I take this medication? They need to know if you have any of these conditions: Dental or gum disease Had thyroid or parathyroid (glands located in neck) surgery Having dental surgery or a tooth pulled Kidney disease Low levels of calcium in the blood On dialysis Poor nutrition Thyroid disease Trouble absorbing nutrients from your food An unusual or allergic reaction to denosumab, other medications, foods, dyes, or preservatives Pregnant or trying to get pregnant Breastfeeding How should I use this medication? This medication is injected under the skin. It is given by your care team in a hospital or clinic setting. A special MedGuide will be given to you before each treatment. Be sure to read this information carefully each time. Talk to your care team about the use of this  medication in children. Special care may be needed. Overdosage: If you think you have taken too much of this medicine contact a poison control center or emergency room at once. NOTE: This medicine is only for you. Do not share this medicine with others. What if I miss a dose? Keep appointments for follow-up doses. It is important not to miss your dose. Call your care team if you are unable to keep an appointment. What may interact with this medication? Do not take this medication with any of the following: Other medications that contain denosumab This medication may also interact with the following: Medications that lower your chance of fighting infection Steroid medications, such as prednisone or cortisone This list may not describe all possible interactions. Give your health care provider a list of all the medicines, herbs, non-prescription drugs, or dietary supplements you use. Also tell them if you smoke, drink alcohol, or use illegal drugs. Some items may interact with your medicine. What should I watch for while using this medication? Your condition will be monitored carefully while you are receiving this medication. You may need blood work done while taking this medication. This medication may increase your risk of getting an infection. Call your care team for advice if you get a fever, chills, sore throat, or other symptoms of a cold or flu. Do not treat yourself. Try to avoid being around people who are sick. Tell your dentist and dental surgeon that you are taking this medication. You should not have major dental surgery while  on this medication. See your dentist to have a dental exam and fix any dental problems before starting this medication. Take good care of your teeth while on this medication. Make sure you see your dentist for regular follow-up appointments. This medication may cause low levels of calcium in your body. The risk of severe side effects is increased in people with  kidney disease. Your care team may prescribe calcium and vitamin D to help prevent low calcium levels while you take this medication. It is important to take calcium and vitamin D as directed by your care team. Talk to your care team if you may be pregnant. Serious birth defects may occur if you take this medication during pregnancy and for 5 months after the last dose. You will need a negative pregnancy test before starting this medication. Contraception is recommended while taking this medication and for 5 months after the last dose. Your care team can help you find the option that works for you. Talk to your care team before breastfeeding. Changes to your treatment plan may be needed. What side effects may I notice from receiving this medication? Side effects that you should report to your care team as soon as possible: Allergic reactions--skin rash, itching, hives, swelling of the face, lips, tongue, or throat Infection--fever, chills, cough, sore throat, wounds that don't heal, pain or trouble when passing urine, general feeling of discomfort or being unwell Low calcium level--muscle pain or cramps, confusion, tingling, or numbness in the hands or feet Osteonecrosis of the jaw--pain, swelling, or redness in the mouth, numbness of the jaw, poor healing after dental work, unusual discharge from the mouth, visible bones in the mouth Severe bone, joint, or muscle pain Skin infection--skin redness, swelling, warmth, or pain Side effects that usually do not require medical attention (report these to your care team if they continue or are bothersome): Back pain Headache Joint pain Muscle pain Pain in the hands, arms, legs, or feet Runny or stuffy nose Sore throat This list may not describe all possible side effects. Call your doctor for medical advice about side effects. You may report side effects to FDA at 1-800-FDA-1088. Where should I keep my medication? This medication is given in a hospital  or clinic. It will not be stored at home. NOTE: This sheet is a summary. It may not cover all possible information. If you have questions about this medicine, talk to your doctor, pharmacist, or health care provider.  2024 Elsevier/Gold Standard (2022-08-18 00:00:00)

## 2023-08-13 LAB — CULTURE, BLOOD (SINGLE)
Culture: NO GROWTH
Special Requests: ADEQUATE

## 2023-08-13 LAB — PROSTATE-SPECIFIC AG, SERUM (LABCORP): Prostate Specific Ag, Serum: 3.4 ng/mL (ref 0.0–4.0)

## 2023-08-18 ENCOUNTER — Telehealth: Payer: Self-pay | Admitting: Hematology

## 2023-08-18 ENCOUNTER — Ambulatory Visit: Payer: Medicare Other | Admitting: Internal Medicine

## 2023-08-18 ENCOUNTER — Encounter: Payer: Self-pay | Admitting: Internal Medicine

## 2023-08-18 VITALS — BP 104/68 | HR 87 | Temp 99.0°F | Resp 16 | Ht 67.0 in | Wt 215.0 lb

## 2023-08-18 DIAGNOSIS — D638 Anemia in other chronic diseases classified elsewhere: Secondary | ICD-10-CM

## 2023-08-18 DIAGNOSIS — E271 Primary adrenocortical insufficiency: Secondary | ICD-10-CM

## 2023-08-18 DIAGNOSIS — I10 Essential (primary) hypertension: Secondary | ICD-10-CM | POA: Diagnosis not present

## 2023-08-18 DIAGNOSIS — C61 Malignant neoplasm of prostate: Secondary | ICD-10-CM | POA: Diagnosis not present

## 2023-08-18 DIAGNOSIS — I950 Idiopathic hypotension: Secondary | ICD-10-CM | POA: Diagnosis not present

## 2023-08-18 DIAGNOSIS — J4521 Mild intermittent asthma with (acute) exacerbation: Secondary | ICD-10-CM

## 2023-08-18 LAB — BASIC METABOLIC PANEL
BUN: 12 mg/dL (ref 6–23)
CO2: 26 meq/L (ref 19–32)
Calcium: 9 mg/dL (ref 8.4–10.5)
Chloride: 102 meq/L (ref 96–112)
Creatinine, Ser: 0.76 mg/dL (ref 0.40–1.50)
GFR: 91.67 mL/min (ref >60.00)
Glucose, Bld: 109 mg/dL — ABNORMAL HIGH (ref 70–99)
Potassium: 4.9 meq/L (ref 3.5–5.1)
Sodium: 134 meq/L — ABNORMAL LOW (ref 135–145)

## 2023-08-18 LAB — CBC WITH DIFFERENTIAL/PLATELET
Basophils Absolute: 0 10*3/uL (ref 0.0–0.1)
Basophils Relative: 0.2 % (ref 0.0–3.0)
Eosinophils Absolute: 0 10*3/uL (ref 0.0–0.7)
Eosinophils Relative: 0 % (ref 0.0–5.0)
HCT: 36.9 % — ABNORMAL LOW (ref 39.0–52.0)
Hemoglobin: 12.1 g/dL — ABNORMAL LOW (ref 13.0–17.0)
Lymphocytes Relative: 17.7 % (ref 12.0–46.0)
Lymphs Abs: 2.2 10*3/uL (ref 0.7–4.0)
MCHC: 32.8 g/dL (ref 30.0–36.0)
MCV: 87.6 fL (ref 78.0–100.0)
Monocytes Absolute: 1.7 10*3/uL — ABNORMAL HIGH (ref 0.1–1.0)
Monocytes Relative: 13.7 % — ABNORMAL HIGH (ref 3.0–12.0)
Neutro Abs: 8.6 10*3/uL — ABNORMAL HIGH (ref 1.4–7.7)
Neutrophils Relative %: 68.4 % (ref 43.0–77.0)
Platelets: 457 10*3/uL — ABNORMAL HIGH (ref 150.0–400.0)
RBC: 4.21 Mil/uL — ABNORMAL LOW (ref 4.22–5.81)
RDW: 14.9 % (ref 11.5–15.5)
WBC: 12.6 10*3/uL — ABNORMAL HIGH (ref 4.0–10.5)

## 2023-08-18 LAB — CORTISOL: Cortisol, Plasma: 2.2 ug/dL

## 2023-08-18 LAB — TSH: TSH: 0.55 u[IU]/mL (ref 0.35–5.50)

## 2023-08-18 MED ORDER — HYDROCORTISONE 10 MG PO TABS: 10.0000 mg | ORAL_TABLET | Freq: Every morning | ORAL | 0 refills | Status: DC

## 2023-08-18 MED ORDER — HYDROCORTISONE 5 MG PO TABS: 5.0000 mg | ORAL_TABLET | Freq: Every evening | ORAL | 0 refills | Status: DC

## 2023-08-18 MED ORDER — ALBUTEROL SULFATE (2.5 MG/3ML) 0.083% IN NEBU: 2.5000 mg | INHALATION_SOLUTION | Freq: Four times a day (QID) | RESPIRATORY_TRACT | 2 refills | Status: DC | PRN

## 2023-08-18 NOTE — Progress Notes (Signed)
Subjective:  Patient ID: [name] Holder, male    DOB: [birth year]  Age: 68 y.o. MRN: [id number]  CC: Hypertension and Anemia   HPI [name] Holder presents for f/up ----   Discussed the use of AI scribe software for clinical note transcription with the patient, who gave verbal consent to proceed.  History of Present Illness   The patient, with a history of hypotension, presents with a recent fall at home two weeks ago. The fall was caused by tripping over a dog bed, resulting in a fracture of the second metacarpal. The patient's arm was buddy taped and placed in a splint, which is to be worn for at least four weeks. Additionally, the patient sustained an injury to the backside of the forearm. The patient was treated with Bactrim, which was completed five days ago. The patient describes his health status as being like a 'roller coaster.'       Outpatient Medications Prior to Visit  Medication Sig Dispense Refill   abiraterone acetate (ZYTIGA) 250 MG tablet Take 4 tablets (1,000 mg total) by mouth daily. 120 tablet 5   acetaminophen (TYLENOL) 325 MG tablet Take 325 mg by mouth daily as needed for fever.     albuterol (VENTOLIN HFA) 108 (90 Base) MCG/ACT inhaler INHALE 2 PUFFS EVERY 6 HOURS AS NEEDED FOR WHEEZING OR SHORTNESS OF BREATH 18 g 3   azelastine (ASTELIN) 0.1 % nasal spray USE 2 SPRAYS IN EACH NOSTRIL EVERY MORNING AND EVERY EVENING 90 mL 1   CALCIUM-VITAMIN D PO Take 1 tablet by mouth 2 (two) times daily.      carvedilol (COREG) 3.125 MG tablet TAKE ONE TABLET TWICE DAILY WITH FOOD 180 tablet 0   denosumab (PROLIA) 60 MG/ML SOSY injection Inject 60 mg into the skin every 6 (six) months.     DULoxetine (CYMBALTA) 20 MG capsule TAKE ONE CAPSULE DAILY 30 capsule 1   Famotidine (PEPCID AC PO) Take 1 tablet by mouth at bedtime.     fluticasone (FLONASE) 50 MCG/ACT nasal spray USE 2 SPRAYS IN EACH NOSTRIL ONCE DAILY 48 g 1   furosemide (LASIX) 20 MG tablet TAKE ONE  TABLET TWICE DAILY (Patient taking differently: Take 20 mg by mouth daily.) 180 tablet 0   leuprolide (LUPRON) 11.25 MG injection Inject 11.25 mg into the muscle every 3 (three) months.     lubiprostone (AMITIZA) 24 MCG capsule TAKE ONE CAPSULE TWICE DAILY WITH MEAL(S) 180 capsule 0   meclizine (ANTIVERT) 12.5 MG tablet Take 1 tablet (12.5 mg total) by mouth 3 (three) times daily as needed for dizziness. 270 tablet 0   Multiple Vitamins-Minerals (CENTRUM SILVER 50+MEN PO) Take 1 tablet by mouth daily.     oxyCODONE (OXY IR/ROXICODONE) 5 MG immediate release tablet Take 1 tablet (5 mg) by mouth every 4 hours as needed for severe pain. 180 tablet 0   predniSONE (DELTASONE) 5 MG tablet TAKE 1 TABLET 2 TIMES A DAY 90 tablet 3   sodium chloride 1 g tablet Take 1 g by mouth 2 (two) times daily with a meal.     solifenacin (VESICARE) 5 MG tablet Take 5 mg by mouth daily.     TRELEGY ELLIPTA 100-62.5-25 MCG/ACT AEPB INHALE 1 PUFF INTO LUNGS DAILY 120 each 3   vitamin C (ASCORBIC ACID) 500 MG tablet Take 500 mg by mouth every evening.     vitamin E 180 MG (400 UNITS) capsule Take 400 Units by mouth every  evening.     albuterol (PROVENTIL) (2.5 MG/3ML) 0.083% nebulizer solution Take 3 mLs (2.5 mg total) by nebulization every 6 (six) hours as needed for wheezing or shortness of breath. 360 mL 2   olmesartan (BENICAR) 20 MG tablet TAKE ONE TABLET DAILY 90 tablet 0   NARCAN 4 MG/0.1ML LIQD nasal spray kit Place 0.4 mg into the nose once as needed (overdose).      polyethylene glycol powder (GLYCOLAX/MIRALAX) 17 GM/SCOOP powder Take 17 g (1 capful) by mouth daily as directed. (Patient not taking: Reported on 08/18/2023) 510 g 6   rosuvastatin (CRESTOR) 10 MG tablet Take 1 tablet (10 mg total) by mouth daily. (Patient not taking: Reported on 08/18/2023) 90 tablet 1   Specialty Vitamins Products (VISTA ADVANCED CAROTENOID) CAPS Take 1 capsule by mouth every evening. (Patient not taking: Reported on 08/18/2023)      mupirocin ointment (BACTROBAN) 2 % Apply 1 Application topically 2 (two) times daily. 30 g 1   ondansetron (ZOFRAN) 4 MG tablet Take 1 tablet (4 mg total) by mouth every 6 (six) hours as needed for nausea. 20 tablet 0   No facility-administered medications prior to visit.    ROS Review of Systems  Constitutional:  Negative for appetite change, chills, diaphoresis, fatigue and fever.  HENT:  Negative for sore throat and trouble swallowing.   Eyes:  Negative for visual disturbance.  Respiratory:  Positive for cough and wheezing. Negative for chest tightness, shortness of breath and stridor.   Cardiovascular:  Negative for chest pain, palpitations and leg swelling.  Gastrointestinal: Negative.  Negative for abdominal pain, constipation, nausea and vomiting.  Genitourinary: Negative.  Negative for difficulty urinating, dysuria and hematuria.  Musculoskeletal:  Positive for back pain and gait problem. Negative for myalgias.  Skin: Negative.   Neurological:  Positive for dizziness and light-headedness. Negative for weakness.  Hematological:  Negative for adenopathy. Does not bruise/bleed easily.  Psychiatric/Behavioral: Negative.      Objective:  BP 104/68 (BP Location: Left Arm, Patient Position: Sitting, Cuff Size: Normal)   Pulse 87   Temp 99 F (37.2 C) (Oral)   Resp 16   Ht 5\' 7"  (1.702 m)   Wt 215 lb (97.5 kg)   SpO2 97%   BMI 33.67 kg/m   BP Readings from Last 3 Encounters:  08/18/23 104/68  08/12/23 124/65  08/12/23 (!) 80/63    Wt Readings from Last 3 Encounters:  08/18/23 215 lb (97.5 kg)  08/12/23 214 lb 12.8 oz (97.4 kg)  08/08/23 220 lb (99.8 kg)    Physical Exam Vitals reviewed.  Constitutional:      Appearance: Normal appearance.  HENT:     Mouth/Throat:     Mouth: Mucous membranes are moist.  Eyes:     General: No scleral icterus.    Conjunctiva/sclera: Conjunctivae normal.  Cardiovascular:     Rate and Rhythm: Normal rate and regular rhythm.      Heart sounds: No murmur heard. Pulmonary:     Effort: Pulmonary effort is normal.     Breath sounds: No stridor. No wheezing, rhonchi or rales.  Abdominal:     General: Abdomen is flat.     Palpations: There is no mass.     Tenderness: There is no abdominal tenderness. There is no guarding.     Hernia: No hernia is present.  Musculoskeletal:        General: Normal range of motion.     Cervical back: Neck supple.  Lymphadenopathy:  Cervical: No cervical adenopathy.  Skin:    General: Skin is warm and dry.     Coloration: Skin is pale.     Findings: No rash.  Neurological:     Mental Status: He is alert. Mental status is at baseline.     Lab Results  Component Value Date   WBC 12.6 (H) 08/18/2023   HGB 12.1 (L) 08/18/2023   HCT 36.9 (L) 08/18/2023   PLT 457.0 (H) 08/18/2023   GLUCOSE 109 (H) 08/18/2023   CHOL 191 05/17/2023   TRIG 157.0 (H) 05/17/2023   HDL 44.20 05/17/2023   LDLCALC 115 (H) 05/17/2023   ALT 24 08/12/2023   AST 17 08/12/2023   NA 134 (L) 08/18/2023   K 4.9 08/18/2023   CL 102 08/18/2023   CREATININE 0.76 08/18/2023   BUN 12 08/18/2023   CO2 26 08/18/2023   TSH 0.55 08/18/2023   PSA 3.4 08/12/2023   INR 1.1 09/17/2021   HGBA1C 5.6 12/01/2022    DG Hand 2 View Left Result Date: 08/08/2023 CLINICAL DATA:  Left hand bruising after fall EXAM: LEFT HAND - 2 VIEW COMPARISON:  None Available. FINDINGS: Acute mildly displaced fracture of the second metacarpal diaphysis with shortening. Additional fracture through the base of the ring finger distal phalanx with minimal displacement. No additional fractures are identified. Mild-to-moderate degenerative changes. Mild soft tissue swelling. IMPRESSION: 1. Acute mildly displaced fracture of the second metacarpal diaphysis. 2. Acute minimally displaced fracture through the base of the ring finger distal phalanx. Electronically Signed   By: Duanne Guess D.O.   On: 08/08/2023 17:52   DG Chest 2 View Result  Date: 08/08/2023 CLINICAL DATA:  Shortness of breath.  Confusion. EXAM: CHEST - 2 VIEW COMPARISON:  Chest x-ray dated September 06, 2022. FINDINGS: The heart size and mediastinal contours are within normal limits. Normal pulmonary vascularity. Chronic mild bibasilar scarring again noted. No focal consolidation, pleural effusion, or pneumothorax. No acute osseous abnormality. Multiple chronic thoracolumbar compression deformities, several of which have had cement augmentation. IMPRESSION: 1. No acute cardiopulmonary disease. Electronically Signed   By: Obie Dredge M.D.   On: 08/08/2023 17:00    Assessment & Plan:  Essential hypertension -     Basic metabolic panel; Future -     AMB Referral VBCI Care Management  Mild intermittent asthma with acute exacerbation -     Albuterol Sulfate; Take 3 mLs (2.5 mg total) by nebulization every 6 (six) hours as needed for wheezing or shortness of breath.  Dispense: 360 mL; Refill: 2  Prostate cancer (HCC)- Recent PSA was down to 3.4.  Anemia of chronic disease -     CBC with Differential/Platelet; Future  Idiopathic hypotension- His blood pressure is overcontrolled.  Will discontinue carvedilol and the ARB.  Will treat the adrenal insufficiency. -     TSH; Future -     Cortisol; Future -     Basic metabolic panel; Future -     AMB Referral VBCI Care Management  Adrenal insufficiency (Addison's disease) (HCC) -     Hydrocortisone; Take 1 tablet (10 mg total) by mouth every morning.  Dispense: 30 tablet; Refill: 0 -     Hydrocortisone; Take 1 tablet (5 mg total) by mouth every evening.  Dispense: 30 tablet; Refill: 0     Follow-up: Return in about 3 months (around 11/16/2023).  Sanda Linger, MD

## 2023-08-18 NOTE — Telephone Encounter (Signed)
Patient is aware of scheduled appointment times/dates for follow up

## 2023-08-18 NOTE — Patient Instructions (Signed)

## 2023-08-19 ENCOUNTER — Encounter: Payer: Self-pay | Admitting: Internal Medicine

## 2023-08-21 DIAGNOSIS — S50311A Abrasion of right elbow, initial encounter: Secondary | ICD-10-CM | POA: Diagnosis not present

## 2023-08-23 ENCOUNTER — Telehealth: Payer: Self-pay

## 2023-08-23 NOTE — Progress Notes (Signed)
Care Guide Pharmacy Note  08/23/2023 Name: [name] Holder MRN: [id number] DOB: 05-09-1956  Referred By: Etta Grandchild, MD Reason for referral: Care Coordination (Outreach to schedule with Pharm d )   [name] Holder is a 68 y.o. year old male who is a primary care patient of Etta Grandchild, MD.  [name] was referred to the pharmacist for assistance related to: HTN  Successful contact was made with the patient to discuss pharmacy services including being ready for the pharmacist to call at least 5 minutes before the scheduled appointment time and to have medication bottles and any blood pressure readings ready for review. The patient agreed to meet with the pharmacist via telephone visit on (date/time).09/08/2023  Penne Lash , RMA     [place]  [facility], [facility] Guide  Direct Dial: [phone]  Website: [email or web address]

## 2023-08-24 ENCOUNTER — Telehealth: Payer: Self-pay

## 2023-08-24 ENCOUNTER — Ambulatory Visit: Payer: Self-pay | Admitting: Internal Medicine

## 2023-08-24 NOTE — Telephone Encounter (Signed)
Please advise

## 2023-08-24 NOTE — Telephone Encounter (Signed)
Chief Complaint: hypertension Symptoms: asymptomatic hypertension and possible aspiration from GERD Frequency: BP 180/92 last night, BP 164/90 today, hx of GERD and pt states he aspirated food in his sleep last night Pertinent Negatives: Patient denies SOB, CP, blurry vision, headache, new gait problem, dizziness, wheezing Disposition: [] ED /[] Urgent Care (no appt availability in office) / [x] Appointment(In office/virtual)/ []  [facility]/ [] Home Care/ [] Refused Recommended Disposition /[] [facility]/ []  Follow-up with PCP Additional Notes: Wife Samara Deist called on behalf of pt. Wife states that pt has recently been prescribed hydrocortisone PO for recent blood pressures at an ED visit. Hydrocortisone prescribed on 1/22 by Dr. Yetta Barre. Since then, pt has had high BP. BP last night was 180/92 and today it is 164/90, per wife. Pt denies CP, SOB, blurry vision, headache, new gait problem, dizziness. Wife states his pressures are normally not this high. Wife states she has a Engineer, civil (consulting) and withheld his hydrocortisone today. Additionally, wife states pt has a hx of GERD. Pt reports waking in the middle of the night feeling like he aspirated on food. Wife states this has happened before and has resulted in aspiration pneumonia. Wife states pt has been coughing today and is concerned. Dry cough, no SOB. Pt using nebulizer q4 hrs as prescribed for asthma & COPD. Pt not needing supplemental O2. Per protocol, pt scheduled in office tomorrow at 1120. RN advised wife to take pt to the ED if he has CP, SOB, blurry vision, headache, dizziness, or new respiratory symptoms. Wife verbalized understanding. Wife wants to know if pt should take his hydrocortisone today. This RN advised she would sent to the appropriate people for follow-up.   Copied from CRM [id number]. Topic: Clinical - Red Word Triage >> Aug 24, 2023  3:21 PM Deaijah H wrote: Red Word that prompted transfer to Nurse Triage: Blood pressure still  elevated 164/90 Reason for Disposition  Systolic BP  >= 160 OR Diastolic >= 100  Answer Assessment - Initial Assessment Questions 1. BLOOD PRESSURE: "What is the blood pressure?" "Did you take at least two measurements 5 minutes apart?"     164/90 today 2. ONSET: "When did you take your blood pressure?"     Just now  3. HOW: "How did you take your blood pressure?" (e.g., automatic home BP monitor, visiting nurse)     Automatic BP 4. HISTORY: "Do you have a history of high blood pressure?"     No 5. MEDICINES: "Are you taking any medicines for blood pressure?" "Have you missed any doses recently?"     Took hydrocortisone yesterday, not any today. 6. OTHER SYMPTOMS: "Do you have any symptoms?" (e.g., blurred vision, chest pain, difficulty breathing, headache, weakness)     Non-productive cough since this AM since aspirating (GERD), no SOB, no CP. Headache yesterday.  Answer Assessment - Initial Assessment Questions 1. ONSET: "When did the cough begin?"      This AM after aspirating last night 2. SEVERITY: "How bad is the cough today?"      Bother him for 15-20 mins, then it goes away, none in the last hour 3. SPUTUM: "Describe the color of your sputum" (none, dry cough; clear, white, yellow, green)     Non-productive 4. HEMOPTYSIS: "Are you coughing up any blood?" If so ask: "How much?" (flecks, streaks, tablespoons, etc.)     No 5. DIFFICULTY BREATHING: "Are you having difficulty breathing?" If Yes, ask: "How bad is it?" (e.g., mild, moderate, severe)    - MILD: No SOB at  rest, mild SOB with walking, speaks normally in sentences, can lie down, no retractions, pulse < 100.    - MODERATE: SOB at rest, SOB with minimal exertion and prefers to sit, cannot lie down flat, speaks in phrases, mild retractions, audible wheezing, pulse 100-120.    - SEVERE: Very SOB at rest, speaks in single words, struggling to breathe, sitting hunched forward, retractions, pulse > 120      No difficulty  breathing, just cough 6. FEVER: "Do you have a fever?" If Yes, ask: "What is your temperature, how was it measured, and when did it start?"     No fever 7. CARDIAC HISTORY: "Do you have any history of heart disease?" (e.g., heart attack, congestive heart failure)      No 8. LUNG HISTORY: "Do you have any history of lung disease?"  (e.g., pulmonary embolus, asthma, emphysema)     Hx of asthma and COPD. Not needing supplemental O2 today. 9. PE RISK FACTORS: "Do you have a history of blood clots?" (or: recent major surgery, recent prolonged travel, bedridden)     No 10. OTHER SYMPTOMS: "Do you have any other symptoms?" (e.g., runny nose, wheezing, chest pain)       No  Protocols used: Blood Pressure - High-A-AH, Cough - Acute Non-Productive-A-AH

## 2023-08-24 NOTE — Telephone Encounter (Signed)
Copied from CRM [id number]. Topic: Clinical - Medical Advice >> Aug 24, 2023  9:21 AM Elizebeth Brooking wrote: Reason for CRM: - Put on new medication since then blood pressure has been in the 180 and 160 range - would like to know if they stop or continue  hydrocortisone (CORTEF) 5 MG tablet hydrocortisone (CORTEF) 10 MG tablet  - Sometimes aspirate during the night  and then that causes pneumonia  - needs to know if he needs to speak with somebody about it and make a appointment for that   Would like a callback regarding these two concerns

## 2023-08-25 ENCOUNTER — Ambulatory Visit (INDEPENDENT_AMBULATORY_CARE_PROVIDER_SITE_OTHER): Payer: Medicare Other | Admitting: Nurse Practitioner

## 2023-08-25 ENCOUNTER — Encounter: Payer: Self-pay | Admitting: Nurse Practitioner

## 2023-08-25 ENCOUNTER — Ambulatory Visit (INDEPENDENT_AMBULATORY_CARE_PROVIDER_SITE_OTHER): Payer: Medicare Other

## 2023-08-25 VITALS — BP 118/82 | HR 87 | Temp 98.0°F | Ht 67.0 in

## 2023-08-25 DIAGNOSIS — R7989 Other specified abnormal findings of blood chemistry: Secondary | ICD-10-CM | POA: Diagnosis not present

## 2023-08-25 DIAGNOSIS — R059 Cough, unspecified: Secondary | ICD-10-CM | POA: Diagnosis not present

## 2023-08-25 NOTE — Telephone Encounter (Signed)
Patient was seen in office today.

## 2023-08-25 NOTE — Assessment & Plan Note (Signed)
Acute, patient's concern about possible aspiration No fever, chills, lung sounds are clear today. Will get x-ray today for further evaluation, further recommendations may be made based upon the results.

## 2023-08-25 NOTE — Assessment & Plan Note (Signed)
Unclear if this is due to primary Addison's disease or possibly secondary related to his chronic use of prednisone.  Patient had elevation in blood pressure with hydrocortisone use.  For now patient will stay off of hydrocortisone, continue prednisone 5 mg twice a day with increase in dose temporarily as needed to help treat illness.  Will also refer to endocrinology to truly determine whether or not he has Addison's disease.  Patient reports understanding.

## 2023-08-25 NOTE — Progress Notes (Signed)
Established Patient Office Visit  Subjective   Patient ID: [name] Holder, male    DOB: 05/22/1956  Age: 68 y.o. MRN: [id number]  Chief Complaint  Patient presents with   Hypertension  Patient arrives today accompanied by his wife.   HTN: Has had elevated at-home BP readings with dizziness and headaches. Readings have been 180s/90s. He was recently started on hydrocortisone for concerns of possible Addison's disease. Had serum cortisol reading of 2.2 around 11:30am. He is on chronic prednisone 5mg  BID for last few years as part of cancer treatment. He stopped the hydrocortisone about 3 days ago as he was concerned this may be what caused the spike in BP. Today he is feeling better. Is on carvedilol 3.125mg  BID, furosemide 20mg  BID, and olemsartan (1/2 tab BID) but is not sure of exact dosage.   Aspiration: Reports a coughing fit around 3:00am this morning that woke him from sleep. He has a history of aspiration pneumonia, coughed on his oral secretions today not on food or drink. Does not feel short of breath, no increase in overall coughing, no fever/chills. He is currently on keflex for treatment of cellulitis.     ROS: See HPI    Objective:     BP 118/82   Pulse 87   Temp 98 F (36.7 C) (Temporal)   Ht 5\' 7"  (1.702 m)   SpO2 97%   BMI 33.67 kg/m    Physical Exam Vitals reviewed.  Constitutional:      Appearance: Normal appearance.  HENT:     Head: Normocephalic and atraumatic.  Cardiovascular:     Rate and Rhythm: Normal rate and regular rhythm.  Pulmonary:     Effort: Pulmonary effort is normal.     Breath sounds: Normal breath sounds.  Musculoskeletal:     Cervical back: Neck supple.  Skin:    General: Skin is warm and dry.  Neurological:     Mental Status: He is alert and oriented to person, place, and time.  Psychiatric:        Mood and Affect: Mood normal.        Behavior: Behavior normal.        Thought Content: Thought content normal.         Judgment: Judgment normal.      No results found for any visits on 08/25/23.    The 10-year ASCVD risk score (Arnett DK, et al., 2019) is: 17.7%    Assessment & Plan:   Problem List Items Addressed This Visit       Other   Cough - Primary   Acute, patient's concern about possible aspiration No fever, chills, lung sounds are clear today. Will get x-ray today for further evaluation, further recommendations may be made based upon the results.      Relevant Orders   DG Chest 2 View   Low serum cortisol level   Unclear if this is due to primary Addison's disease or possibly secondary related to his chronic use of prednisone.  Patient had elevation in blood pressure with hydrocortisone use.  For now patient will stay off of hydrocortisone, continue prednisone 5 mg twice a day with increase in dose temporarily as needed to help treat illness.  Will also refer to endocrinology to truly determine whether or not he has Addison's disease.  Patient reports understanding.      Relevant Orders   Ambulatory referral to Endocrinology    Return if symptoms worsen or fail to improve.  Elenore Paddy, NP

## 2023-08-27 ENCOUNTER — Telehealth: Payer: Self-pay | Admitting: Internal Medicine

## 2023-08-27 NOTE — Telephone Encounter (Unsigned)
Copied from CRM [id number]. Topic: Clinical - Medical Advice >> Aug 27, 2023  9:36 AM Adelina Mings wrote: Reason for CRM: wants to see if nurse Maralyn Sago can give patient a call back regarding some antibiotics he would like called in he's not feeling better

## 2023-09-03 ENCOUNTER — Other Ambulatory Visit: Payer: Self-pay | Admitting: Nurse Practitioner

## 2023-09-06 ENCOUNTER — Other Ambulatory Visit: Payer: Self-pay | Admitting: Internal Medicine

## 2023-09-06 DIAGNOSIS — I10 Essential (primary) hypertension: Secondary | ICD-10-CM

## 2023-09-07 DIAGNOSIS — E669 Obesity, unspecified: Secondary | ICD-10-CM | POA: Diagnosis not present

## 2023-09-07 DIAGNOSIS — J4489 Other specified chronic obstructive pulmonary disease: Secondary | ICD-10-CM | POA: Diagnosis not present

## 2023-09-07 DIAGNOSIS — K5903 Drug induced constipation: Secondary | ICD-10-CM | POA: Diagnosis not present

## 2023-09-07 DIAGNOSIS — J189 Pneumonia, unspecified organism: Secondary | ICD-10-CM | POA: Diagnosis not present

## 2023-09-08 ENCOUNTER — Other Ambulatory Visit: Payer: Self-pay | Admitting: Internal Medicine

## 2023-09-08 ENCOUNTER — Other Ambulatory Visit: Payer: Self-pay | Admitting: Hematology

## 2023-09-08 ENCOUNTER — Other Ambulatory Visit: Payer: Medicare Other | Admitting: Pharmacist

## 2023-09-08 DIAGNOSIS — I10 Essential (primary) hypertension: Secondary | ICD-10-CM

## 2023-09-08 DIAGNOSIS — T402X5A Adverse effect of other opioids, initial encounter: Secondary | ICD-10-CM

## 2023-09-08 DIAGNOSIS — Z8546 Personal history of malignant neoplasm of prostate: Secondary | ICD-10-CM

## 2023-09-08 MED ORDER — LINACLOTIDE 145 MCG PO CAPS: 145.0000 ug | ORAL_CAPSULE | Freq: Every day | ORAL | 2 refills | Status: DC

## 2023-09-08 MED ORDER — OLMESARTAN MEDOXOMIL 20 MG PO TABS: 20.0000 mg | ORAL_TABLET | Freq: Two times a day (BID) | ORAL | 1 refills | Status: DC

## 2023-09-08 NOTE — Progress Notes (Signed)
   09/08/2023 Name: [name] Holder MRN: [id number] DOB: 18-Feb-1956  Chief Complaint  Patient presents with   Hypertension   Medication Management    [name] Holder is a 68 y.o. year old male who presented for a telephone visit.   They were referred to the pharmacist by their PCP for assistance in managing hypertension.    Subjective:  Care Team: Primary Care Provider: Etta Grandchild, MD ; Next Scheduled Visit: 11/16/23   Medication Access/Adherence  Current Pharmacy:  [facility] [place], [place] - [address] [place] [address] Phone: [phone] Fax: [phone]   Patient reports affordability concerns with their medications: No  Patient reports access/transportation concerns to their pharmacy: No  Patient reports adherence concerns with their medications:  No     Hypertension:  Current medications: Olmesartan 40 mg 1/2 tablet twice daily, carvedilol 3.125 mg twice daily  Patient has a validated, automated, upper arm home BP cuff Current blood pressure readings: 124/70-80 - pt reports BP's have been much more stable since stopping hydrocortisone  Patient denies hypotensive s/sx including dizziness, lightheadedness.  Patient denies hypertensive symptoms including headache, chest pain, shortness of breath  Constipation: Current medications: Amitiza BID, docusate once daily, glycerin suppositories PRN (1-2x per month) Has previously tried Miralax - tried 1 cap daily then 1/2 cap daily then every other day and it was always end up causing diarrhea  Pt notes he does try to increase fiber and stay hydrated  Objective:  Lab Results  Component Value Date   HGBA1C 5.6 12/01/2022    Lab Results  Component Value Date   CREATININE 0.76 08/18/2023   BUN 12 08/18/2023   NA 134 (L) 08/18/2023   K 4.9 08/18/2023   CL 102 08/18/2023   CO2 26 08/18/2023    Lab Results  Component Value Date   CHOL 191 05/17/2023    HDL 44.20 05/17/2023   LDLCALC 115 (H) 05/17/2023   TRIG 157.0 (H) 05/17/2023   CHOLHDL 4 05/17/2023    Medications Reviewed Today   Medications were not reviewed in this encounter       Assessment/Plan:   Hypertension: - Currently controlled, BP goal <130/80 - Recommend to continue current regimen. - Having trouble getting olmesartan refill. Sent refill and changed to olmesartan 20 mg BID so pt does not have to cut tabs in half  Constipation: - Uncontrolled - Recommend Linzess for refractory opioid induced constipation. Stop Amitiza. - Continue docusate and nonpharmacologic methods   Follow Up Plan: PRN  Arbutus Leas, PharmD, BCPS, CPP Clinical Pharmacist Practitioner [facility] [phone]

## 2023-09-08 NOTE — Patient Instructions (Signed)
It was a pleasure speaking with you today!  I have sent in Linzess 145 mcg to daily once daily. Stop Amitiza.  Linzess dose can be decreased or increased if needed, so let us know if constipation is not improved on the current dose after a couple of weeks.  Feel free to call with any questions or concerns!  Arbutus Leas, PharmD, BCPS, CPP Clinical Pharmacist Practitioner [facility] [phone]

## 2023-09-08 NOTE — Telephone Encounter (Unsigned)
Copied from CRM [id number]. Topic: Clinical - Prescription Issue >> Sep 08, 2023  8:15 AM Theodis Sato wrote: Reason for CRM: Stu from [facility] pharmacy is calling in reference to patients olmesartan (BENICAR) 40 MG tablet being denied as there is no documentation as to why this medication has been denied. Please call Stu back at [phone] so that he can give this patient an explanation.

## 2023-09-14 DIAGNOSIS — S62321D Displaced fracture of shaft of second metacarpal bone, left hand, subsequent encounter for fracture with routine healing: Secondary | ICD-10-CM | POA: Diagnosis not present

## 2023-09-21 ENCOUNTER — Encounter: Payer: Self-pay | Admitting: Primary Care

## 2023-09-21 ENCOUNTER — Ambulatory Visit: Payer: Medicare Other | Admitting: Primary Care

## 2023-09-21 DIAGNOSIS — J4521 Mild intermittent asthma with (acute) exacerbation: Secondary | ICD-10-CM | POA: Diagnosis not present

## 2023-09-21 DIAGNOSIS — J301 Allergic rhinitis due to pollen: Secondary | ICD-10-CM

## 2023-09-21 DIAGNOSIS — I10 Essential (primary) hypertension: Secondary | ICD-10-CM | POA: Diagnosis not present

## 2023-09-21 DIAGNOSIS — Z9109 Other allergy status, other than to drugs and biological substances: Secondary | ICD-10-CM | POA: Diagnosis not present

## 2023-09-21 DIAGNOSIS — E2740 Unspecified adrenocortical insufficiency: Secondary | ICD-10-CM | POA: Diagnosis not present

## 2023-09-21 DIAGNOSIS — J4520 Mild intermittent asthma, uncomplicated: Secondary | ICD-10-CM

## 2023-09-21 DIAGNOSIS — Z7952 Long term (current) use of systemic steroids: Secondary | ICD-10-CM | POA: Diagnosis not present

## 2023-09-21 DIAGNOSIS — M818 Other osteoporosis without current pathological fracture: Secondary | ICD-10-CM | POA: Diagnosis not present

## 2023-09-21 DIAGNOSIS — Z0000 Encounter for general adult medical examination without abnormal findings: Secondary | ICD-10-CM

## 2023-09-21 MED ORDER — AZELASTINE HCL 0.1 % NA SOLN: 1.0000 | Freq: Two times a day (BID) | NASAL | 3 refills | Status: AC

## 2023-09-21 MED ORDER — FLUTICASONE PROPIONATE 50 MCG/ACT NA SUSP: 2.0000 | Freq: Every day | NASAL | 1 refills | Status: DC

## 2023-09-21 MED ORDER — ALBUTEROL SULFATE HFA 108 (90 BASE) MCG/ACT IN AERS: 2.0000 | INHALATION_SPRAY | Freq: Four times a day (QID) | RESPIRATORY_TRACT | 3 refills | Status: DC | PRN

## 2023-09-21 MED ORDER — TRELEGY ELLIPTA 100-62.5-25 MCG/ACT IN AEPB: 1.0000 | INHALATION_SPRAY | Freq: Every day | RESPIRATORY_TRACT | 3 refills | Status: DC

## 2023-09-21 MED ORDER — ALBUTEROL SULFATE (2.5 MG/3ML) 0.083% IN NEBU
2.5000 mg | INHALATION_SOLUTION | Freq: Four times a day (QID) | RESPIRATORY_TRACT | 2 refills | Status: DC | PRN
Start: 2023-09-21 — End: 2024-02-18

## 2023-09-21 NOTE — Patient Instructions (Addendum)
 -  COPD/ASTHMA: Chronic Obstructive Pulmonary Disease (COPD) and asthma are conditions that cause breathing difficulties. You should reduce your albuterol nebulizer use to twice daily (morning and around 5pm) and continue using the Trelegy inhaler as maintenance therapy. Use your rescue inhaler as needed and consider using a flutter valve for chest congestion or bronchitis symptoms.  -ASPIRATION PNEUMONIA: Aspiration pneumonia occurs when food, liquid, or vomit is inhaled into the lungs, causing infection. No changes to your current management plan were discussed.  -ALLERGIC RHINITIS: Allergic rhinitis is an allergic reaction that causes sneezing, congestion, and a runny nose. Your condition is stable, and your prescriptions for Flonase and Astelin will be refilled.  -GENERAL HEALTH MAINTENANCE: Consider getting an Covid booster this spring and RSV vaccine next year. Follow up in one year, or sooner if symptoms worsen or you have recurrent chest infections.  Follow-up:  One year with either Dr. Judeth Horn or Dr. Francine Graven (30 min visit-former [facility] patient) or sooner if symptoms worsen or you have recurrent chest infections.

## 2023-09-21 NOTE — Progress Notes (Signed)
 @Patient  ID: [name] Holder, male    DOB: Aug 18, 1955, 68 y.o.   MRN: [id number]  Chief Complaint  Patient presents with   Follow-up    Referring provider: Etta Grandchild, MD  HPI: 66 year old, former smoker. PMH significant for HTN, asthma/copd overlap, aspiration pneumonia, addison's disease, SIADH, prostate cancer, obesity, hyperlipidemia.   09/21/2023 Discussed the use of AI scribe software for clinical note transcription with the patient, who gave verbal consent to proceed.  History of Present Illness   Patient presents today for overdue follow-up. He is accompanied by his wife. Patient is followed by our office for history of asthma/COPD overlap, he was last seen by Dr. Tonia Holder in October 2023.  He is needing refills of his medications including Trelegy Ellipta 100 mcg. He uses a nebulizer with albuterol solution four times a day, though he does not always feel the need for it. His breathing is exertional, with cold and humid weather exacerbating his symptoms. He has not been hospitalized for COPD but has experienced several episodes of aspiration pneumonia during sleep. His breathing has improved since using the nebulizer regularly, though he still experiences shortness of breath with exertion. He uses a flutter valve to aid with mucus clearance, especially when experiencing increased chest congestion or bronchitis symptoms.   No recent hospitalizations for respiratory issues or frequent courses of antibiotics or prednisone for COPD exacerbations. He does day prednisone daily as part of his cancer treatment.  He received a flu shot this year and had a chest x-ray in January, which showed stable findings with some scarring or atelectasis. He does not smoke.  No typical nocturnal awakenings due to asthma or breathing problems. He experiences exertional shortness of breath and is affected by cold and humid weather.      Allergies  Allergen Reactions   Amlodipine Swelling    edema    Tetracycline Rash    Immunization History  Administered Date(s) Administered   Fluad Quad(high Dose 65+) 04/17/2019, 05/15/2020, 04/18/2021   Influenza Inj Mdck Quad Pf 04/17/2019   Influenza Split 04/26/2013, 05/10/2014   Influenza, High Dose Seasonal PF 04/27/2019   Influenza,inj,Quad PF,6+ Mos 05/19/2017   Influenza-Unspecified 05/10/2016, 05/06/2018, 04/18/2021   Moderna Covid-19 Vaccine Bivalent Booster 2yrs & up 05/13/2021   PFIZER SARS-COV-2 Pediatric Vaccination 5-23yrs 11/08/2020   PFIZER(Purple Top)SARS-COV-2 Vaccination 09/01/2019, 09/22/2019, 04/23/2020, 05/27/2020   Pneumococcal Conjugate-13 08/29/2015   Pneumococcal Polysaccharide-23 01/06/2013, 04/27/2019   Rsv, Bivalent, Protein Subunit Rsvpref,pf [name] Holder) 04/20/2022   Tdap 08/26/2012   Zoster Recombinant(Shingrix) 06/17/2020, 11/20/2020    Past Medical History:  Diagnosis Date   Allergic rhinitis    At risk for sleep apnea    STOP-BANG SCORE = 5    (routed to pt's pcp 09-08-2018)   Bladder cancer (HCC) dx'd 2020   Chronic constipation    Chronic low back pain    Chronic pain    DOE (dyspnea on exertion)    History of kidney stones    History of recent pneumonia 08/10/2018   acute bronchopneumonia;  follow-up cxr 08-28-2018 in epic   History of vertebral compression fracture    T10 and T12 s/p kyphoplasty   Hx of colonic polyps    Hyperplasia of prostate with lower urinary tract symptoms (LUTS)    Hypertension    Mild persistent asthma    followed by pcp   Numbness in both legs    secondary to a fall, per pt has had work-up and couldn't find anything  Osteopenia    Prostate cancer metastatic to pelvis [facility]) urologist-- dr [name] Holder/  oncologist -- dr [name] Holder   dx 2014 advanced prostate cancer with pelvid adenopathy,  Stage T2c, Gleason 7;  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   Wears glasses     Tobacco History: Social History   Tobacco Use  Smoking Status Former   Current packs/day: 0.00   Average  packs/day: 1 pack/day for 25.0 years (25.0 ttl pk-yrs)   Types: Cigarettes   Start date: 09/09/1987   Quit date: 09/08/2012   Years since quitting: 11.0  Smokeless Tobacco Never   Counseling given: Not Answered   Outpatient Medications Prior to Visit  Medication Sig Dispense Refill   abiraterone acetate (ZYTIGA) 250 MG tablet TAKE 4 TABLETS (1,000 MG TOTAL) BY MOUTH DAILY. 120 tablet 0   acetaminophen (TYLENOL) 325 MG tablet Take 325 mg by mouth daily as needed for fever.     albuterol (PROVENTIL) (2.5 MG/3ML) 0.083% nebulizer solution Take 3 mLs (2.5 mg total) by nebulization every 6 (six) hours as needed for wheezing or shortness of breath. 360 mL 2   albuterol (VENTOLIN HFA) 108 (90 Base) MCG/ACT inhaler INHALE 2 PUFFS EVERY 6 HOURS AS NEEDED FOR WHEEZING OR SHORTNESS OF BREATH 18 g 3   azelastine (ASTELIN) 0.1 % nasal spray USE 2 SPRAYS IN EACH NOSTRIL EVERY MORNING AND EVERY EVENING 90 mL 1   CALCIUM-VITAMIN D PO Take 1 tablet by mouth 2 (two) times daily.      carvedilol (COREG) 3.125 MG tablet TAKE ONE TABLET TWICE DAILY WITH FOOD 180 tablet 0   denosumab (PROLIA) 60 MG/ML SOSY injection Inject 60 mg into the skin every 6 (six) months.     docusate sodium (COLACE) 50 MG capsule Take 50 mg by mouth daily.     DULoxetine (CYMBALTA) 20 MG capsule TAKE ONE CAPSULE DAILY 30 capsule 1   Famotidine (PEPCID AC PO) Take 1 tablet by mouth at bedtime.     fluticasone (FLONASE) 50 MCG/ACT nasal spray USE 2 SPRAYS IN EACH NOSTRIL ONCE DAILY 48 g 1   furosemide (LASIX) 20 MG tablet TAKE ONE TABLET TWICE DAILY (Patient taking differently: Take 20 mg by mouth daily.) 180 tablet 0   leuprolide (LUPRON) 11.25 MG injection Inject 11.25 mg into the muscle every 3 (three) months.     linaclotide (LINZESS) 145 MCG CAPS capsule Take 1 capsule (145 mcg total) by mouth daily before breakfast. 30 capsule 2   meclizine (ANTIVERT) 12.5 MG tablet Take 1 tablet (12.5 mg total) by mouth 3 (three) times daily as  needed for dizziness. 270 tablet 0   Multiple Vitamins-Minerals (CENTRUM SILVER 50+MEN PO) Take 1 tablet by mouth daily.     NARCAN 4 MG/0.1ML LIQD nasal spray kit Place 0.4 mg into the nose once as needed (overdose).      olmesartan (BENICAR) 20 MG tablet Take 1 tablet (20 mg total) by mouth 2 (two) times daily. 180 tablet 1   oxyCODONE (OXY IR/ROXICODONE) 5 MG immediate release tablet Take 1 tablet (5 mg) by mouth every 4 hours as needed for severe pain. 180 tablet 0   polyethylene glycol powder (GLYCOLAX/MIRALAX) 17 GM/SCOOP powder Take 17 g (1 capful) by mouth daily as directed. 510 g 6   predniSONE (DELTASONE) 5 MG tablet TAKE 1 TABLET 2 TIMES A DAY 90 tablet 3   rosuvastatin (CRESTOR) 10 MG tablet Take 1 tablet (10 mg total) by mouth daily. 90 tablet 1   sodium chloride 1 g  tablet Take 1 g by mouth 2 (two) times daily with a meal.     solifenacin (VESICARE) 5 MG tablet Take 5 mg by mouth daily.     Specialty Vitamins Products (VISTA ADVANCED CAROTENOID) CAPS Take 1 capsule by mouth every evening.     TRELEGY ELLIPTA 100-62.5-25 MCG/ACT AEPB INHALE 1 PUFF INTO LUNGS DAILY 120 each 3   vitamin C (ASCORBIC ACID) 500 MG tablet Take 500 mg by mouth every evening.     vitamin E 180 MG (400 UNITS) capsule Take 400 Units by mouth every evening.     No facility-administered medications prior to visit.   Review of Systems  Review of Systems  Constitutional: Negative.   HENT: Negative.    Respiratory:  Positive for shortness of breath.   Cardiovascular: Negative.    Physical Exam  BP 130/76 (BP Location: Left Arm, Patient Position: Sitting, Cuff Size: Large)   Pulse 78   Temp 99.3 F (37.4 C) (Oral)   Ht 5\' 6"  (1.676 m)   Wt 214 lb 9.6 oz (97.3 kg)   SpO2 98%   BMI 34.64 kg/m  Physical Exam Constitutional:      Appearance: Normal appearance.  HENT:     Head: Normocephalic and atraumatic.     Mouth/Throat:     Mouth: Mucous membranes are moist.     Pharynx: Oropharynx is clear.   Cardiovascular:     Rate and Rhythm: Normal rate and regular rhythm.  Pulmonary:     Effort: Pulmonary effort is normal.     Breath sounds: Normal breath sounds. No wheezing or rhonchi.  Musculoskeletal:        General: Normal range of motion.  Skin:    General: Skin is warm and dry.  Neurological:     General: No focal deficit present.     Mental Status: He is alert and oriented to person, place, and time. Mental status is at baseline.  Psychiatric:        Mood and Affect: Mood normal.        Behavior: Behavior normal.        Thought Content: Thought content normal.        Judgment: Judgment normal.      Lab Results:  CBC    Component Value Date/Time   WBC 12.6 (H) 08/18/2023 1136   RBC 4.21 (L) 08/18/2023 1136   HGB 12.1 (L) 08/18/2023 1136   HGB 12.4 (L) 08/12/2023 1104   HCT 36.9 (L) 08/18/2023 1136   PLT 457.0 (H) 08/18/2023 1136   PLT 295 08/12/2023 1104   MCV 87.6 08/18/2023 1136   MCH 29.0 08/12/2023 1104   MCHC 32.8 08/18/2023 1136   RDW 14.9 08/18/2023 1136   LYMPHSABS 2.2 08/18/2023 1136   MONOABS 1.7 (H) 08/18/2023 1136   EOSABS 0.0 08/18/2023 1136   BASOSABS 0.0 08/18/2023 1136    BMET    Component Value Date/Time   NA 134 (L) 08/18/2023 1136   K 4.9 08/18/2023 1136   CL 102 08/18/2023 1136   CO2 26 08/18/2023 1136   GLUCOSE 109 (H) 08/18/2023 1136   BUN 12 08/18/2023 1136   CREATININE 0.76 08/18/2023 1136   CREATININE 0.99 08/12/2023 1104   CALCIUM 9.0 08/18/2023 1136   GFRNONAA >60 08/12/2023 1104   GFRAA >60 02/12/2020 0858    BNP    Component Value Date/Time   BNP 79.2 09/03/2022 1200    ProBNP    Component Value Date/Time   PROBNP 51.0 12/31/2021  1452    Imaging: DG Chest 2 View Result Date: 08/25/2023 CLINICAL DATA:  Cough. EXAM: CHEST - 2 VIEW COMPARISON:  August 08, 2023. FINDINGS: The heart size and mediastinal contours are within normal limits. Stable left lingular scarring or subsegmental atelectasis. Right lung is  clear. Status post kyphoplasty at multiple levels in lower thoracic and lumbar spine. IMPRESSION: Stable minimal left lingular subsegmental atelectasis or scarring. Electronically Signed   By: Lupita Raider M.D.   On: 08/25/2023 13:47     Assessment & Plan:   1. Mild intermittent asthma with acute exacerbation - albuterol (PROVENTIL) (2.5 MG/3ML) 0.083% nebulizer solution; Take 3 mLs (2.5 mg total) by nebulization every 6 (six) hours as needed for wheezing or shortness of breath.  Dispense: 360 mL; Refill: 2  2. Mild intermittent asthma without complication - Fluticasone-Umeclidin-Vilant (TRELEGY ELLIPTA) 100-62.5-25 MCG/ACT AEPB; Inhale 1 puff into the lungs daily.  Dispense: 120 each; Refill: 3 - albuterol (VENTOLIN HFA) 108 (90 Base) MCG/ACT inhaler; Inhale 2 puffs into the lungs every 6 (six) hours as needed for wheezing or shortness of breath.  Dispense: 18 g; Refill: 3  3. Seasonal allergic rhinitis due to pollen - fluticasone (FLONASE) 50 MCG/ACT nasal spray; Place 2 sprays into both nostrils daily.  Dispense: 48 g; Refill: 1  4. Routine general medical examination at a health care facility - azelastine (ASTELIN) 0.1 % nasal spray; Place 1 spray into both nostrils 2 (two) times daily. Use in each nostril as directed  Dispense: 90 mL; Refill: 3  5. Environmental allergies - azelastine (ASTELIN) 0.1 % nasal spray; Place 1 spray into both nostrils 2 (two) times daily. Use in each nostril as directed  Dispense: 90 mL; Refill: 3   COPD/Asthma Stable on Trelegy inhaler daily and albuterol nebulizer. No recent hospitalizations for COPD. Exertional dyspnea and sensitivity to cold, dry weather and humid summer weather. No nocturnal symptoms. Act score 17.  -Reduce albuterol nebulizer use to twice daily (morning and around 5pm) from four times daily. -Continue Trelegy inhaler as maintenance therapy. -Use rescue inhaler as needed every 4-6 hours for breakthrough symptoms. -Consider  use of flutter valve for chest congestion or bronchitis symptoms.  Allergic Rhinitis Stable on Flonase and Astelin. -Refill prescriptions for Flonase and Astelin.  Prostate cancer -Managed by oncology and urology   COVID-19 Vaccination Received COVID-19 vaccine summer/fall 2024 -Consider another booster in six months, per CDC recommendations for patient over age of 27.  General Health Maintenance -Continue prednisone for cancer treatment. -Consider RSV vaccine booster next year if recommended. -Follow-up in one year, or sooner if symptoms worsen or recurrent chest infections occur.     Glenford Bayley, NP 09/21/2023

## 2023-09-27 ENCOUNTER — Other Ambulatory Visit: Payer: Self-pay | Admitting: Hematology

## 2023-09-27 ENCOUNTER — Telehealth: Payer: Self-pay

## 2023-09-27 ENCOUNTER — Other Ambulatory Visit (HOSPITAL_COMMUNITY): Payer: Self-pay

## 2023-09-27 ENCOUNTER — Other Ambulatory Visit: Payer: Self-pay

## 2023-09-27 DIAGNOSIS — C7951 Secondary malignant neoplasm of bone: Secondary | ICD-10-CM

## 2023-09-27 MED ORDER — OXYCODONE HCL 5 MG PO TABS
5.0000 mg | ORAL_TABLET | ORAL | 0 refills | Status: DC | PRN
Start: 2023-09-27 — End: 2023-11-08
  Filled 2023-09-27: qty 180, 30d supply, fill #0

## 2023-09-27 NOTE — Progress Notes (Unsigned)
 [name] Holder

## 2023-09-27 NOTE — Telephone Encounter (Signed)
 Received telephone call from patient requesting a refill on oxycodone. Pharmacy: [facility].

## 2023-09-29 ENCOUNTER — Other Ambulatory Visit (HOSPITAL_COMMUNITY): Payer: Self-pay

## 2023-10-04 ENCOUNTER — Other Ambulatory Visit: Payer: Self-pay

## 2023-10-04 ENCOUNTER — Emergency Department (HOSPITAL_COMMUNITY)
Admission: EM | Admit: 2023-10-04 | Discharge: 2023-10-05 | Disposition: A | Discharge status: Home / Self Care | Attending: Emergency Medicine | Admitting: Emergency Medicine

## 2023-10-04 ENCOUNTER — Encounter (HOSPITAL_COMMUNITY): Payer: Self-pay

## 2023-10-04 DIAGNOSIS — I10 Essential (primary) hypertension: Secondary | ICD-10-CM | POA: Diagnosis not present

## 2023-10-04 DIAGNOSIS — C679 Malignant neoplasm of bladder, unspecified: Secondary | ICD-10-CM | POA: Diagnosis not present

## 2023-10-04 DIAGNOSIS — R404 Transient alteration of awareness: Secondary | ICD-10-CM | POA: Diagnosis not present

## 2023-10-04 DIAGNOSIS — C61 Malignant neoplasm of prostate: Secondary | ICD-10-CM | POA: Diagnosis not present

## 2023-10-04 DIAGNOSIS — J9811 Atelectasis: Secondary | ICD-10-CM | POA: Diagnosis not present

## 2023-10-04 DIAGNOSIS — R4182 Altered mental status, unspecified: Secondary | ICD-10-CM | POA: Diagnosis not present

## 2023-10-04 DIAGNOSIS — R0602 Shortness of breath: Secondary | ICD-10-CM | POA: Diagnosis not present

## 2023-10-04 DIAGNOSIS — I6782 Cerebral ischemia: Secondary | ICD-10-CM | POA: Diagnosis not present

## 2023-10-04 DIAGNOSIS — R531 Weakness: Secondary | ICD-10-CM | POA: Diagnosis not present

## 2023-10-04 LAB — CBG MONITORING, ED: Glucose-Capillary: 99 mg/dL (ref 70–99)

## 2023-10-04 LAB — CBC
HCT: 34.5 % — ABNORMAL LOW (ref 39.0–52.0)
Hemoglobin: 11.6 g/dL — ABNORMAL LOW (ref 13.0–17.0)
MCH: 29.2 pg (ref 26.0–34.0)
MCHC: 33.6 g/dL (ref 30.0–36.0)
MCV: 86.9 fL (ref 80.0–100.0)
Platelets: 257 10*3/uL (ref 150–400)
RBC: 3.97 MIL/uL — ABNORMAL LOW (ref 4.22–5.81)
RDW: 13.6 % (ref 11.5–15.5)
WBC: 8.6 10*3/uL (ref 4.0–10.5)
nRBC: 0 % (ref 0.0–0.2)

## 2023-10-04 LAB — URINALYSIS, ROUTINE W REFLEX MICROSCOPIC
Bilirubin Urine: NEGATIVE
Glucose, UA: NEGATIVE mg/dL
Hgb urine dipstick: NEGATIVE
Ketones, ur: NEGATIVE mg/dL
Leukocytes,Ua: NEGATIVE
Nitrite: NEGATIVE
Protein, ur: NEGATIVE mg/dL
Specific Gravity, Urine: 1.009 (ref 1.005–1.030)
pH: 7 (ref 5.0–8.0)

## 2023-10-04 LAB — BASIC METABOLIC PANEL
Anion gap: 10 (ref 5–15)
BUN: 7 mg/dL — ABNORMAL LOW (ref 8–23)
CO2: 22 mmol/L (ref 22–32)
Calcium: 8.8 mg/dL — ABNORMAL LOW (ref 8.9–10.3)
Chloride: 99 mmol/L (ref 98–111)
Creatinine, Ser: 0.63 mg/dL (ref 0.61–1.24)
GFR, Estimated: >60 mL/min (ref >60)
Glucose, Bld: 111 mg/dL — ABNORMAL HIGH (ref 70–99)
Potassium: 3.7 mmol/L (ref 3.5–5.1)
Sodium: 131 mmol/L — ABNORMAL LOW (ref 135–145)

## 2023-10-04 NOTE — ED Triage Notes (Signed)
 Dizziness that started yesterday and is worsening today. Pt wife states he took a nap earlier and when he woke up, he was confused for 30 minutes. Hx of prostate cancer with mets to lymph nodes.

## 2023-10-05 ENCOUNTER — Other Ambulatory Visit: Payer: Self-pay | Admitting: Internal Medicine

## 2023-10-05 ENCOUNTER — Emergency Department (HOSPITAL_COMMUNITY)

## 2023-10-05 DIAGNOSIS — R4182 Altered mental status, unspecified: Secondary | ICD-10-CM | POA: Diagnosis not present

## 2023-10-05 DIAGNOSIS — J4489 Other specified chronic obstructive pulmonary disease: Secondary | ICD-10-CM | POA: Diagnosis not present

## 2023-10-05 DIAGNOSIS — K5903 Drug induced constipation: Secondary | ICD-10-CM | POA: Diagnosis not present

## 2023-10-05 DIAGNOSIS — C679 Malignant neoplasm of bladder, unspecified: Secondary | ICD-10-CM | POA: Diagnosis not present

## 2023-10-05 DIAGNOSIS — I6782 Cerebral ischemia: Secondary | ICD-10-CM | POA: Diagnosis not present

## 2023-10-05 DIAGNOSIS — J9811 Atelectasis: Secondary | ICD-10-CM | POA: Diagnosis not present

## 2023-10-05 DIAGNOSIS — E669 Obesity, unspecified: Secondary | ICD-10-CM | POA: Diagnosis not present

## 2023-10-05 DIAGNOSIS — R0602 Shortness of breath: Secondary | ICD-10-CM | POA: Diagnosis not present

## 2023-10-05 DIAGNOSIS — J189 Pneumonia, unspecified organism: Secondary | ICD-10-CM | POA: Diagnosis not present

## 2023-10-05 LAB — CBG MONITORING, ED: Glucose-Capillary: 99 mg/dL (ref 70–99)

## 2023-10-05 LAB — RESP PANEL BY RT-PCR (RSV, FLU A&B, COVID)  RVPGX2
Influenza A by PCR: NEGATIVE
Influenza B by PCR: NEGATIVE
Resp Syncytial Virus by PCR: NEGATIVE
SARS Coronavirus 2 by RT PCR: NEGATIVE

## 2023-10-05 MED ORDER — GADOBUTROL 1 MMOL/ML IV SOLN
10.0000 mL | Freq: Once | INTRAVENOUS | Status: AC | PRN
  Administered 2023-10-05: 10 mL via INTRAVENOUS

## 2023-10-05 MED ORDER — LORAZEPAM 2 MG/ML IJ SOLN
1.0000 mg | Freq: Once | INTRAMUSCULAR | Status: AC | PRN
  Administered 2023-10-05: 1 mg via INTRAVENOUS
  Filled 2023-10-05: qty 1

## 2023-10-05 NOTE — Discharge Instructions (Addendum)
 Your MRI did not show any stroke or other lesions in the brain.  Your lab work here is reassuring.  I would like for you to continue your home medications and follow closely with your primary care doctor.

## 2023-10-05 NOTE — ED Provider Notes (Signed)
 Emergency Department Provider Note   I have reviewed the triage vital signs and the nursing notes.   HISTORY  Chief Complaint Dizziness   HPI [name] Holder is a 68 y.o. male with past history reviewed below including metastatic prostate cancer and adrenal insufficiency presents to the emergency department with confusion and generalized weakness.  Patient states he woke from a nap today and for around 30 minutes was very confused and weak.  He was staggering around and required significant assistance from his wife.  He regained most of his function but has been unusually sleepy since the event.  No witnessed seizure.  No falls or head trauma.  No new medications.    Past Medical History:  Diagnosis Date   Allergic rhinitis    At risk for sleep apnea    STOP-BANG SCORE = 5    (routed to pt's pcp 09-08-2018)   Bladder cancer (HCC) dx'd 2020   Chronic constipation    Chronic low back pain    Chronic pain    DOE (dyspnea on exertion)    History of kidney stones    History of recent pneumonia 08/10/2018   acute bronchopneumonia;  follow-up cxr 08-28-2018 in epic   History of vertebral compression fracture    T10 and T12 s/p kyphoplasty   Hx of colonic polyps    Hyperplasia of prostate with lower urinary tract symptoms (LUTS)    Hypertension    Mild persistent asthma    followed by pcp   Numbness in both legs    secondary to a fall, per pt has had work-up and couldn't find anything   Osteopenia    Prostate cancer metastatic to pelvis [facility]) urologist-- dr borden/  oncologist -- dr Clelia Croft   dx 2014 advanced prostate cancer with pelvid adenopathy,  Stage T2c, Gleason 7;  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   Wears glasses     Review of Systems  Constitutional: No fever/chills Cardiovascular: Denies chest pain. Respiratory: Denies shortness of breath. Gastrointestinal: No abdominal pain.  No nausea, no vomiting.  Musculoskeletal: Negative for back pain. Skin: Negative for  rash. Neurological: Negative for headaches, focal weakness or numbness. Episode of weakness/confusion.    ____________________________________________   PHYSICAL EXAM:  VITAL SIGNS: ED Triage Vitals  Encounter Vitals Group     BP 10/04/23 2046 (!) 141/93     Pulse Rate 10/04/23 2046 85     Resp 10/04/23 2046 16     Temp 10/04/23 2000 98.6 F (37 C)     Temp Source 10/04/23 2000 Oral     SpO2 10/04/23 2046 98 %     Weight 10/04/23 2045 212 lb (96.2 kg)     Height 10/04/23 2045 5\' 8"  (1.727 m)   Constitutional: Alert and oriented. Well appearing and in no acute distress. Eyes: Conjunctivae are normal. PERRL. EOMI. Head: Atraumatic. Nose: No congestion/rhinnorhea. Mouth/Throat: Mucous membranes are moist.  Oropharynx non-erythematous. Neck: No stridor.   Cardiovascular: Normal rate, regular rhythm. Good peripheral circulation. Grossly normal heart sounds.   Respiratory: Normal respiratory effort.  No retractions. Lungs CTAB. Gastrointestinal: Soft and nontender. No distention.  Musculoskeletal: No lower extremity tenderness nor edema. No gross deformities of extremities. Neurologic:  Normal speech and language. No gross focal neurologic deficits are appreciated. 5/5 strength in the bilateral upper and lower extremities.  Skin:  Skin is warm, dry and intact. No rash noted.   ____________________________________________   LABS (all labs ordered are listed, but only abnormal results are displayed)  Labs Reviewed  BASIC METABOLIC PANEL - Abnormal; Notable for the following components:      Result Value   Sodium 131 (*)    Glucose, Bld 111 (*)    BUN 7 (*)    Calcium 8.8 (*)    All other components within normal limits  CBC - Abnormal; Notable for the following components:   RBC 3.97 (*)    Hemoglobin 11.6 (*)    HCT 34.5 (*)    All other components within normal limits  RESP PANEL BY RT-PCR (RSV, FLU A&B, COVID)  [id number]  URINALYSIS, ROUTINE W REFLEX MICROSCOPIC  CBG  MONITORING, ED  CBG MONITORING, ED   ____________________________________________  EKG  Rate: 86 PR: 180 QTc: 455  NSR. Single PVC. Narrow QRS. No STEMI.  ____________________________________________  RADIOLOGY  MR BRAIN W WO CONTRAST Result Date: 10/05/2023 CLINICAL DATA:  Mental status change with unknown cause. History of bladder cancer EXAM: MRI HEAD WITHOUT AND WITH CONTRAST TECHNIQUE: Multiplanar, multiecho pulse sequences of the brain and surrounding structures were obtained without and with intravenous contrast. CONTRAST:  10mL GADAVIST GADOBUTROL 1 MMOL/ML IV SOLN COMPARISON:  Head CT from earlier today FINDINGS: Brain: No acute infarction, hemorrhage, hydrocephalus, extra-axial collection or mass lesion. No abnormal enhancement. Mild chronic small vessel ischemia in the cerebral white matter. Vascular: Normal flow voids. Small vertebral and basilar arteries in the setting of fetal type PCA flow bilaterally. Skull and upper cervical spine: Normal marrow signal Sinuses/Orbits: Negative Other: Motion artifact. IMPRESSION: No acute finding or specific cause for symptoms. No evidence of metastatic disease. Intermittent motion artifact Electronically Signed   By: Tiburcio Pea M.D.   On: 10/05/2023 06:21   DG Chest Portable 1 View Result Date: 10/05/2023 CLINICAL DATA:  Shortness of breath EXAM: PORTABLE CHEST 1 VIEW COMPARISON:  08/25/2023 FINDINGS: Left basilar atelectasis. Normal cardiomediastinal contours. No focal airspace consolidation or pulmonary edema. No pleural effusion or pneumothorax. IMPRESSION: Left basilar atelectasis. Electronically Signed   By: Deatra Robinson M.D.   On: 10/05/2023 03:18   CT Head Wo Contrast Result Date: 10/05/2023 CLINICAL DATA:  Altered mental status EXAM: CT HEAD WITHOUT CONTRAST TECHNIQUE: Contiguous axial images were obtained from the base of the skull through the vertex without intravenous contrast. RADIATION DOSE REDUCTION: This exam was performed  according to the departmental dose-optimization program which includes automated exposure control, adjustment of the mA and/or kV according to patient size and/or use of iterative reconstruction technique. COMPARISON:  09/16/2021 FINDINGS: Brain: No mass,hemorrhage or extra-axial collection. Normal appearance of the parenchyma and CSF spaces. Vascular: No hyperdense vessel or unexpected vascular calcification. Skull: The visualized skull base, calvarium and extracranial soft tissues are normal. Sinuses/Orbits: No fluid levels or advanced mucosal thickening of the visualized paranasal sinuses. No mastoid or middle ear effusion. Normal orbits. Other: None. IMPRESSION: Normal head CT. Electronically Signed   By: Deatra Robinson M.D.   On: 10/05/2023 03:17    ____________________________________________   PROCEDURES  Procedure(s) performed:   Procedures   ____________________________________________   INITIAL IMPRESSION / ASSESSMENT AND PLAN / ED COURSE  Pertinent labs & imaging results that were available during my care of the patient were reviewed by me and considered in my medical decision making (see chart for details).   This patient is Presenting for Evaluation of AMS, which does require a range of treatment options, and is a complaint that involves a high risk of morbidity and mortality.  The Differential Diagnoses includes but is not exclusive to alcohol,  illicit or prescription medications, intracranial pathology such as stroke, intracerebral hemorrhage, fever or infectious causes including sepsis, hypoxemia, uremia, trauma, endocrine related disorders such as diabetes, hypoglycemia, thyroid-related diseases, etc.   Critical Interventions-    Medications  LORazepam (ATIVAN) injection 1 mg (1 mg Intravenous Given 10/05/23 0437)  gadobutrol (GADAVIST) 1 MMOL/ML injection 10 mL (10 mLs Intravenous Contrast Given 10/05/23 0518)    Reassessment after intervention: patient has returned to  baseline.    I did obtain Additional Historical Information from wife at bedside.   Clinical Laboratory Tests Ordered, included sodium only slightly low at 131.  No acute kidney injury.  Glucose normal.  No UTI.  Radiologic Tests Ordered, included CXR and CT head. I independently interpreted the images and agree with radiology interpretation.   Cardiac Monitor Tracing which shows NSR.    Social Determinants of Health Risk patient is a non-smoker.  Medical Decision Making: Summary:  Episode of weakness and confusion earlier today.  No focal neurodeficits witnessed seizure activity.  Low suspicion for adrenal crisis with normal sodium and vitals.  Plan to start with CT scan of the head.  Consideration for altered mental status as above.  May ultimately require MRI with contrast as he does have history of metastatic prostate cancer.  Reevaluation with update and discussion with patient. MRI brain without acute findings. Plan for PCP follow up. Stable for discharge.   Considered admission but workup is largely unremarkable.   Patient's presentation is most consistent with acute presentation with potential threat to life or bodily function.   Disposition: discharge  ____________________________________________  FINAL CLINICAL IMPRESSION(S) / ED DIAGNOSES  Final diagnoses:  Transient alteration of awareness    Note:  This document was prepared using Dragon voice recognition software and may include unintentional dictation errors.  Alona Bene, MD, [facility]    Osiris Odriscoll, Arlyss Repress, MD 10/05/23 [id number]

## 2023-10-06 ENCOUNTER — Telehealth: Payer: Self-pay

## 2023-10-06 NOTE — Transitions of Care (Post Inpatient/ED Visit) (Signed)
 10/06/2023  Name: [name] Holder MRN: [id number] DOB: 1955/09/24  Today's TOC FU Call Status: Today's TOC FU Call Status:: Successful TOC FU Call Completed TOC FU Call Complete Date: 10/06/23 Patient's Name and Date of Birth confirmed.  Transition Care Management Follow-up Telephone Call Date of Discharge: 10/05/23 Discharge Facility: [facility]) Type of Discharge: Emergency Department Reason for ED Visit: Other: (altered mental status) How have you been since you were released from the hospital?: Better Any questions or concerns?: No  Items Reviewed: Did you receive and understand the discharge instructions provided?: Yes Medications obtained,verified, and reconciled?: Yes (Medications Reviewed) Any new allergies since your discharge?: No Dietary orders reviewed?: Yes Do you have support at home?: Yes People in Home: spouse  Medications Reviewed Today: Medications Reviewed Today     Reviewed by Karena Addison, LPN (Licensed Practical Nurse) on 10/06/23 at 1123  Med List Status: <None>   Medication Order Taking? Sig Documenting Provider Last Dose Status Informant  abiraterone acetate (ZYTIGA) 250 MG tablet [id number] No TAKE 4 TABLETS (1,000 MG TOTAL) BY MOUTH DAILY. [name], MD Taking Active   acetaminophen (TYLENOL) 325 MG tablet [id number] No Take 325 mg by mouth daily as needed for fever. [provider] Taking Active Spouse/Significant Other  albuterol (PROVENTIL) (2.5 MG/3ML) 0.083% nebulizer solution [id number]  Take 3 mLs (2.5 mg total) by nebulization every 6 (six) hours as needed for wheezing or shortness of breath. Glenford Bayley, NP  Active   albuterol (VENTOLIN HFA) 108 (90 Base) MCG/ACT inhaler [id number]  Inhale 2 puffs into the lungs every 6 (six) hours as needed for wheezing or shortness of breath. Glenford Bayley, NP  Active   azelastine (ASTELIN) 0.1 % nasal spray [id number]  Place 1 spray into both nostrils 2 (two) times daily. Use in  each nostril as directed Glenford Bayley, NP  Active   CALCIUM-VITAMIN D PO [id number] No Take 1 tablet by mouth 2 (two) times daily.  [provider] Taking Active Spouse/Significant Other  carvedilol (COREG) 3.125 MG tablet [id number] No TAKE ONE TABLET TWICE DAILY WITH FOOD Etta Grandchild, MD Taking Active   denosumab (PROLIA) 60 MG/ML SOSY injection [id number] No Inject 60 mg into the skin every 6 (six) months. [provider] Taking Active Spouse/Significant Other           Med Note Penni Bombard   Thu Sep 03, 2022  2:38 PM) Last dose 3 months ago  docusate sodium (COLACE) 50 MG capsule [id number] No Take 50 mg by mouth daily. [provider] Taking Active   DULoxetine (CYMBALTA) 20 MG capsule [id number] No TAKE ONE CAPSULE DAILY Pollyann Samples, NP Taking Active   Famotidine (PEPCID AC PO) [id number] No Take 1 tablet by mouth at bedtime. [provider] Taking Active Spouse/Significant Other  fluticasone (FLONASE) 50 MCG/ACT nasal spray [id number]  Place 2 sprays into both nostrils daily. Glenford Bayley, NP  Active   Fluticasone-Umeclidin-Vilant (TRELEGY ELLIPTA) 100-62.5-25 MCG/ACT AEPB [id number]  Inhale 1 puff into the lungs daily. Glenford Bayley, NP  Active   furosemide (LASIX) 20 MG tablet [id number] No TAKE ONE TABLET TWICE DAILY  Patient taking differently: Take 20 mg by mouth daily.   Etta Grandchild, MD Taking Active   leuprolide (LUPRON) 11.25 MG injection [id number] No Inject 11.25 mg into the muscle every 3 (three) months. [provider] Taking Active Spouse/Significant Other  linaclotide (LINZESS) 145 MCG CAPS capsule [id number] No Take 1 capsule (145  mcg total) by mouth daily before breakfast. Etta Grandchild, MD Taking Active   meclizine (ANTIVERT) 12.5 MG tablet [id number] No Take 1 tablet (12.5 mg total) by mouth 3 (three) times daily as needed for dizziness. Etta Grandchild, MD Taking Active   Multiple Vitamins-Minerals  (CENTRUM SILVER 50+MEN PO) [id number] No Take 1 tablet by mouth daily. [provider] Taking Active Spouse/Significant Other  NARCAN 4 MG/0.1ML LIQD nasal spray kit [id number] No Place 0.4 mg into the nose once as needed (overdose).  [provider] Taking Active Spouse/Significant Other           Med Note Maxie Better Aug 15, 2019 12:28 PM) Has on hand/ never used  olmesartan (BENICAR) 20 MG tablet [id number] No Take 1 tablet (20 mg total) by mouth 2 (two) times daily. Etta Grandchild, MD Taking Active   oxyCODONE (OXY IR/ROXICODONE) 5 MG immediate release tablet [id number]  Take 1 tablet (5 mg) by mouth every 4 hours as needed for severe pain. [name], MD  Active   polyethylene glycol powder [facility]) 17 GM/SCOOP powder [id number] No Take 17 g (1 capful) by mouth daily as directed. Benjiman Core, MD Taking Active Spouse/Significant Other  predniSONE (DELTASONE) 5 MG tablet [id number] No TAKE 1 TABLET 2 TIMES A Loney Laurence, MD Taking Active   rosuvastatin (CRESTOR) 10 MG tablet [id number] No Take 1 tablet (10 mg total) by mouth daily. Etta Grandchild, MD Taking Active   sodium chloride 1 g tablet [id number] No Take 1 g by mouth 2 (two) times daily with a meal. [provider] Taking Active Spouse/Significant Other  solifenacin (VESICARE) 5 MG tablet [id number] No Take 5 mg by mouth daily. [provider] Taking Active Spouse/Significant Other  Specialty Vitamins Products (VISTA ADVANCED CAROTENOID) CAPS [id number] No Take 1 capsule by mouth every evening. [provider] Taking Active Spouse/Significant Other  vitamin C (ASCORBIC ACID) 500 MG tablet [id number] No Take 500 mg by mouth every evening. [provider] Taking Active Spouse/Significant Other  vitamin E 180 MG (400 UNITS) capsule [id number] No Take 400 Units by mouth every evening. [provider] Taking Active Spouse/Significant Other            Home Care  and Equipment/Supplies: Were Home Health Services Ordered?: NA Any new equipment or medical supplies ordered?: NA  Functional Questionnaire: Do you need assistance with bathing/showering or dressing?: No Do you need assistance with meal preparation?: No Do you need assistance with eating?: No Do you have difficulty maintaining continence: No Do you need assistance with getting out of bed/getting out of a chair/moving?: No Do you have difficulty managing or taking your medications?: No  Follow up appointments reviewed: PCP Follow-up appointment confirmed?: No (declined appt) MD Provider Line Number:[phone] Given: No Specialist Hospital Follow-up appointment confirmed?: NA Do you need transportation to your follow-up appointment?: No Do you understand care options if your condition(s) worsen?: Yes-patient verbalized understanding    SIGNATURE Karena Addison, LPN [facility] Nurse Health Advisor Direct Dial [phone]

## 2023-10-08 ENCOUNTER — Other Ambulatory Visit: Payer: Self-pay | Admitting: Hematology

## 2023-10-08 DIAGNOSIS — Z8546 Personal history of malignant neoplasm of prostate: Secondary | ICD-10-CM

## 2023-10-13 ENCOUNTER — Ambulatory Visit (INDEPENDENT_AMBULATORY_CARE_PROVIDER_SITE_OTHER): Admitting: Family Medicine

## 2023-10-13 ENCOUNTER — Encounter: Payer: Self-pay | Admitting: Family Medicine

## 2023-10-13 VITALS — BP 120/72 | HR 77 | Temp 98.1°F | Ht 68.0 in | Wt 208.4 lb

## 2023-10-13 DIAGNOSIS — B0223 Postherpetic polyneuropathy: Secondary | ICD-10-CM

## 2023-10-13 DIAGNOSIS — E871 Hypo-osmolality and hyponatremia: Secondary | ICD-10-CM | POA: Diagnosis not present

## 2023-10-13 DIAGNOSIS — E271 Primary adrenocortical insufficiency: Secondary | ICD-10-CM | POA: Diagnosis not present

## 2023-10-13 DIAGNOSIS — I10 Essential (primary) hypertension: Secondary | ICD-10-CM | POA: Diagnosis not present

## 2023-10-13 DIAGNOSIS — R410 Disorientation, unspecified: Secondary | ICD-10-CM

## 2023-10-13 LAB — COMPREHENSIVE METABOLIC PANEL
ALT: 19 U/L (ref 0–53)
AST: 19 U/L (ref 0–37)
Albumin: 4.4 g/dL (ref 3.5–5.2)
Alkaline Phosphatase: 44 U/L (ref 39–117)
BUN: 19 mg/dL (ref 6–23)
CO2: 28 meq/L (ref 19–32)
Calcium: 10.3 mg/dL (ref 8.4–10.5)
Chloride: 100 meq/L (ref 96–112)
Creatinine, Ser: 0.96 mg/dL (ref 0.40–1.50)
GFR: 80.53 mL/min (ref >60.00)
Glucose, Bld: 131 mg/dL — ABNORMAL HIGH (ref 70–99)
Potassium: 5.2 meq/L — ABNORMAL HIGH (ref 3.5–5.1)
Sodium: 134 meq/L — ABNORMAL LOW (ref 135–145)
Total Bilirubin: 0.5 mg/dL (ref 0.2–1.2)
Total Protein: 6.9 g/dL (ref 6.0–8.3)

## 2023-10-13 LAB — CBC WITH DIFFERENTIAL/PLATELET
Basophils Absolute: 0 10*3/uL (ref 0.0–0.1)
Basophils Relative: 0.5 % (ref 0.0–3.0)
Eosinophils Absolute: 0.3 10*3/uL (ref 0.0–0.7)
Eosinophils Relative: 3.4 % (ref 0.0–5.0)
HCT: 38.9 % — ABNORMAL LOW (ref 39.0–52.0)
Hemoglobin: 13.2 g/dL (ref 13.0–17.0)
Lymphocytes Relative: 17 % (ref 12.0–46.0)
Lymphs Abs: 1.7 10*3/uL (ref 0.7–4.0)
MCHC: 33.8 g/dL (ref 30.0–36.0)
MCV: 88 fl (ref 78.0–100.0)
Monocytes Absolute: 0.8 10*3/uL (ref 0.1–1.0)
Monocytes Relative: 7.7 % (ref 3.0–12.0)
Neutro Abs: 7.1 10*3/uL (ref 1.4–7.7)
Neutrophils Relative %: 71.4 % (ref 43.0–77.0)
Platelets: 370 10*3/uL (ref 150.0–400.0)
RBC: 4.43 Mil/uL (ref 4.22–5.81)
RDW: 14.8 % (ref 11.5–15.5)
WBC: 10 10*3/uL (ref 4.0–10.5)

## 2023-10-13 MED ORDER — TRIAMCINOLONE ACETONIDE 0.1 % EX CREA: 1.0000 | TOPICAL_CREAM | Freq: Two times a day (BID) | CUTANEOUS | 0 refills | Status: AC

## 2023-10-13 MED ORDER — VALACYCLOVIR HCL 1 G PO TABS
1000.0000 mg | ORAL_TABLET | Freq: Three times a day (TID) | ORAL | 0 refills | Status: AC
Start: 2023-10-13 — End: 2023-10-20

## 2023-10-13 NOTE — Progress Notes (Signed)
 Acute Office Visit  Subjective:     Patient ID: [name] Holder, male    DOB: 1955-12-03, 68 y.o.   MRN: [id number]  Chief Complaint  Patient presents with   Hospitalization Follow-up    Has not had any episodes since hospital visit    HPI Patient is in today for ER follow-up. He is accompanied by his wife. Reports that he had an episode of confusion.  Has had this happen before with low sodium. Has history of long-term steroid use, metastatic bone cancer, hyponatremia, SIADH, Addison's disease. Reports that mental status has been intact since he left the hospital, wife is in agreement. Does report new rash to the left abdomen that burns, itches and appears to be blisters. Has negative history of shingles.  Has completed shingles vaccines. Has not attempted OTC treatment. Denies other concerns today.   ROS Per HPI      Objective:    BP 120/72 (BP Location: Left Arm, Patient Position: Sitting)   Pulse 77   Temp 98.1 F (36.7 C) (Temporal)   Ht 5\' 8"  (1.727 m)   Wt 208 lb 6.4 oz (94.5 kg)   SpO2 98%   BMI 31.69 kg/m    Physical Exam Vitals and nursing note reviewed.  Constitutional:      General: He is not in acute distress.    Appearance: Normal appearance.  HENT:     Head: Normocephalic and atraumatic.  Eyes:     Extraocular Movements: Extraocular movements intact.  Cardiovascular:     Rate and Rhythm: Normal rate and regular rhythm.     Pulses: Normal pulses.     Heart sounds: Normal heart sounds.  Pulmonary:     Effort: Pulmonary effort is normal. No respiratory distress.     Breath sounds: Normal breath sounds. No wheezing, rhonchi or rales.  Musculoskeletal:     Cervical back: Normal range of motion.     Comments: In wheelchair  Lymphadenopathy:     Cervical: No cervical adenopathy.  Skin:    General: Skin is warm and dry.     Findings: Rash (Erythematous, vesicular rash to left abdomen and left flank, consistent with varicella-zoster)  present.  Neurological:     General: No focal deficit present.     Mental Status: He is alert and oriented to person, place, and time.  Psychiatric:        Mood and Affect: Mood normal.        Behavior: Behavior normal.     Results for orders placed or performed in visit on 10/13/23  CBC with Differential/Platelet  Result Value Ref Range   WBC 10.0 4.0 - 10.5 K/uL   RBC 4.43 4.22 - 5.81 Mil/uL   Hemoglobin 13.2 13.0 - 17.0 g/dL   HCT 78.2 (L) 95.6 - 21.3 %   MCV 88.0 78.0 - 100.0 fl   MCHC 33.8 30.0 - 36.0 g/dL   RDW 08.6 57.8 - 46.9 %   Platelets 370.0 150.0 - 400.0 K/uL   Neutrophils Relative % 71.4 43.0 - 77.0 %   Lymphocytes Relative 17.0 12.0 - 46.0 %   Monocytes Relative 7.7 3.0 - 12.0 %   Eosinophils Relative 3.4 0.0 - 5.0 %   Basophils Relative 0.5 0.0 - 3.0 %   Neutro Abs 7.1 1.4 - 7.7 K/uL   Lymphs Abs 1.7 0.7 - 4.0 K/uL   Monocytes Absolute 0.8 0.1 - 1.0 K/uL   Eosinophils Absolute 0.3 0.0 - 0.7 K/uL   Basophils  Absolute 0.0 0.0 - 0.1 K/uL  Comprehensive metabolic panel  Result Value Ref Range   Sodium 134 (L) 135 - 145 mEq/L   Potassium 5.2 (H) 3.5 - 5.1 mEq/L   Chloride 100 96 - 112 mEq/L   CO2 28 19 - 32 mEq/L   Glucose, Bld 131 (H) 70 - 99 mg/dL   BUN 19 6 - 23 mg/dL   Creatinine, Ser 1.61 0.40 - 1.50 mg/dL   Total Bilirubin 0.5 0.2 - 1.2 mg/dL   Alkaline Phosphatase 44 39 - 117 U/L   AST 19 0 - 37 U/L   ALT 19 0 - 53 U/L   Total Protein 6.9 6.0 - 8.3 g/dL   Albumin 4.4 3.5 - 5.2 g/dL   GFR 09.60 >45.40 mL/min   Calcium 10.3 8.4 - 10.5 mg/dL        Assessment & Plan:   Adrenal insufficiency (Addison's disease) (HCC)  Hyponatremia -     CBC with Differential/Platelet -     Comprehensive metabolic panel  Shingles (herpes zoster) polyneuropathy -     valACYclovir HCl; Take 1 tablet (1,000 mg total) by mouth 3 (three) times daily for 7 days.  Dispense: 21 tablet; Refill: 0 -     Triamcinolone Acetonide; Apply 1 Application topically 2 (two)  times daily.  Dispense: 15 g; Refill: 0  Primary hypertension  Confusion  Episode of confusion was likely related to hyponatremia, has since resolved Will assess labs today, will be in contact with abnormal results that require further attention  May take Valtrex for shingles May use triamcinolone as needed  Continue current medication regimen.   Follow up as needed.  Meds ordered this encounter  Medications   valACYclovir (VALTREX) 1000 MG tablet    Sig: Take 1 tablet (1,000 mg total) by mouth 3 (three) times daily for 7 days.    Dispense:  21 tablet    Refill:  0   triamcinolone cream (KENALOG) 0.1 %    Sig: Apply 1 Application topically 2 (two) times daily.    Dispense:  15 g    Refill:  0    No follow-ups on file.  Moshe Cipro, FNP

## 2023-10-14 ENCOUNTER — Encounter: Payer: Self-pay | Admitting: Family Medicine

## 2023-10-28 ENCOUNTER — Other Ambulatory Visit: Payer: Self-pay | Admitting: Internal Medicine

## 2023-10-28 DIAGNOSIS — I10 Essential (primary) hypertension: Secondary | ICD-10-CM

## 2023-11-02 ENCOUNTER — Other Ambulatory Visit: Payer: Self-pay | Admitting: Internal Medicine

## 2023-11-02 ENCOUNTER — Other Ambulatory Visit: Payer: Self-pay | Admitting: Nurse Practitioner

## 2023-11-02 DIAGNOSIS — I10 Essential (primary) hypertension: Secondary | ICD-10-CM

## 2023-11-02 DIAGNOSIS — E785 Hyperlipidemia, unspecified: Secondary | ICD-10-CM

## 2023-11-05 ENCOUNTER — Other Ambulatory Visit: Payer: Self-pay | Admitting: Hematology

## 2023-11-05 DIAGNOSIS — K5903 Drug induced constipation: Secondary | ICD-10-CM | POA: Diagnosis not present

## 2023-11-05 DIAGNOSIS — J4489 Other specified chronic obstructive pulmonary disease: Secondary | ICD-10-CM | POA: Diagnosis not present

## 2023-11-05 DIAGNOSIS — Z8546 Personal history of malignant neoplasm of prostate: Secondary | ICD-10-CM

## 2023-11-05 DIAGNOSIS — E669 Obesity, unspecified: Secondary | ICD-10-CM | POA: Diagnosis not present

## 2023-11-08 ENCOUNTER — Other Ambulatory Visit: Payer: Self-pay

## 2023-11-08 ENCOUNTER — Other Ambulatory Visit (HOSPITAL_COMMUNITY): Payer: Self-pay

## 2023-11-08 ENCOUNTER — Other Ambulatory Visit: Payer: Self-pay | Admitting: Hematology

## 2023-11-08 ENCOUNTER — Telehealth: Payer: Self-pay

## 2023-11-08 DIAGNOSIS — C61 Malignant neoplasm of prostate: Secondary | ICD-10-CM

## 2023-11-08 DIAGNOSIS — C7951 Secondary malignant neoplasm of bone: Secondary | ICD-10-CM

## 2023-11-08 MED ORDER — OXYCODONE HCL 5 MG PO TABS
5.0000 mg | ORAL_TABLET | ORAL | 0 refills | Status: DC | PRN
Start: 2023-11-08 — End: 2023-12-31
  Filled 2023-11-08: qty 180, 30d supply, fill #0

## 2023-11-08 NOTE — Telephone Encounter (Signed)
 Patient is coming in for appointment tomorrow would like to get a refill of his oxycodone called into his pharmacy at [facility] to be picked up tomorrow.

## 2023-11-09 ENCOUNTER — Inpatient Hospital Stay: Payer: Medicare Other

## 2023-11-09 ENCOUNTER — Inpatient Hospital Stay: Payer: Medicare Other | Admitting: Hematology

## 2023-11-09 ENCOUNTER — Inpatient Hospital Stay: Payer: Medicare Other | Attending: Hematology

## 2023-11-09 ENCOUNTER — Encounter: Payer: Self-pay | Admitting: Hematology

## 2023-11-09 VITALS — BP 130/78 | HR 84 | Temp 98.1°F | Resp 21 | Ht 68.0 in | Wt 209.7 lb

## 2023-11-09 DIAGNOSIS — C61 Malignant neoplasm of prostate: Secondary | ICD-10-CM

## 2023-11-09 DIAGNOSIS — Z5111 Encounter for antineoplastic chemotherapy: Secondary | ICD-10-CM | POA: Insufficient documentation

## 2023-11-09 DIAGNOSIS — C7951 Secondary malignant neoplasm of bone: Secondary | ICD-10-CM | POA: Diagnosis not present

## 2023-11-09 LAB — CMP (CANCER CENTER ONLY)
ALT: 18 U/L (ref 0–44)
AST: 14 U/L — ABNORMAL LOW (ref 15–41)
Albumin: 4.4 g/dL (ref 3.5–5.0)
Alkaline Phosphatase: 39 U/L (ref 38–126)
Anion gap: 5 (ref 5–15)
BUN: 15 mg/dL (ref 8–23)
CO2: 29 mmol/L (ref 22–32)
Calcium: 9.7 mg/dL (ref 8.9–10.3)
Chloride: 100 mmol/L (ref 98–111)
Creatinine: 0.9 mg/dL (ref 0.61–1.24)
GFR, Estimated: >60 mL/min (ref >60)
Glucose, Bld: 114 mg/dL — ABNORMAL HIGH (ref 70–99)
Potassium: 3.8 mmol/L (ref 3.5–5.1)
Sodium: 134 mmol/L — ABNORMAL LOW (ref 135–145)
Total Bilirubin: 0.4 mg/dL (ref 0.0–1.2)
Total Protein: 6.9 g/dL (ref 6.5–8.1)

## 2023-11-09 LAB — CBC WITH DIFFERENTIAL (CANCER CENTER ONLY)
Abs Immature Granulocytes: 0.05 10*3/uL (ref 0.00–0.07)
Basophils Absolute: 0 10*3/uL (ref 0.0–0.1)
Basophils Relative: 0 %
Eosinophils Absolute: 0.2 10*3/uL (ref 0.0–0.5)
Eosinophils Relative: 2 %
HCT: 38.7 % — ABNORMAL LOW (ref 39.0–52.0)
Hemoglobin: 13.2 g/dL (ref 13.0–17.0)
Immature Granulocytes: 1 %
Lymphocytes Relative: 20 %
Lymphs Abs: 2.2 10*3/uL (ref 0.7–4.0)
MCH: 29.3 pg (ref 26.0–34.0)
MCHC: 34.1 g/dL (ref 30.0–36.0)
MCV: 86 fL (ref 80.0–100.0)
Monocytes Absolute: 0.7 10*3/uL (ref 0.1–1.0)
Monocytes Relative: 7 %
Neutro Abs: 7.7 10*3/uL (ref 1.7–7.7)
Neutrophils Relative %: 70 %
Platelet Count: 280 10*3/uL (ref 150–400)
RBC: 4.5 MIL/uL (ref 4.22–5.81)
RDW: 14.2 % (ref 11.5–15.5)
WBC Count: 10.9 10*3/uL — ABNORMAL HIGH (ref 4.0–10.5)
nRBC: 0 % (ref 0.0–0.2)

## 2023-11-09 MED ORDER — NARCAN 4 MG/0.1ML NA LIQD: 0.4000 mg | Freq: Once | NASAL | 0 refills | Status: AC | PRN

## 2023-11-09 MED ORDER — LEUPROLIDE ACETATE (3 MONTH) 22.5 MG ~~LOC~~ KIT
22.5000 mg | PACK | Freq: Once | SUBCUTANEOUS | Status: AC
Start: 2023-11-09 — End: 2023-11-09
  Administered 2023-11-09: 22.5 mg via SUBCUTANEOUS
  Filled 2023-11-09: qty 22.5

## 2023-11-09 NOTE — Progress Notes (Signed)
 [name]   [phone] [phone]   Clinic Follow up Note   Patient Care Team: Etta Grandchild, MD as PCP - General (Internal Medicine) Conley Rolls, [address], [place] as Referring Physician (Optometry) [name], MD as Attending Physician (Hematology and Oncology)  Date of Service:  11/09/2023  CHIEF COMPLAINT: f/u of prostate cancer  CURRENT THERAPY:  Eligard every 3 months Zytiga and prednisone Prolia injection every 6 months  Oncology History   Prostate cancer (HCC) -stage IV with node metastasis, castration resistant, Guardant 360 (-) in 2022 -diagnosed in 2014 with lymphadenopathy, Gleason score 7 and PSA of 23.  -He developed castration resistant in 2018.  His PSA was up to 11.3 at that time.  -He is status post T10, T12, kyphoplasty (osteoprosis related) completed on January 22 of 2020.  -he has been on Zytiga 1000 mg daily with prednisone at 5 mg daily started in 2018, Eligard 22.5 mg every 3 months and Prolia 60 mg every 6 months.  -Tolerating treatment well. His PSA has been slowly trending up with latest 2.2 in July 2024, with doubling time more than 6 months.  Will continue current therapy.    Assessment & Plan Metastatic Prostate Cancer Undergoing treatment with abiraterone and Lupron injections. PSA level previously 3.4, within normal range but trending upward. Testosterone level monitored to ensure suppression. Pain managed with oxycodone, tolerable with adherence. Receiving Prolia for bone health every six months. Potential need for whole body scan if PSA significantly increases or pain worsens. - Administer Lupron injection today. - Monitor PSA and testosterone levels; results pending. - Continue abiraterone therapy. - Continue oxycodone for pain management, with a recent refill provided. - Monitor for pain escalation and contact provider if pain worsens. - Schedule follow-up in three months.   Hyponatremia and SAIDH Low sodium levels  leading to confusion. Sodium level today is 134, within normal range. On sodium chloride supplementation and drinks Gatorade to manage sodium levels. Monitored for signs of confusion. - Continue sodium chloride 1 gram twice daily. - Continue Gatorade consumption to maintain sodium levels. - Monitor for signs of confusion and check sodium levels if symptoms arise.  Opioid Use and Overdose Prevention On oxycodone for pain management related to metastatic prostate cancer. Concern for potential overdose during confusion episodes related to hyponatremia. Previous Narcan supply expired. Discussed Narcan use for opioid overdose, not effective for sodium-related confusion. - Prescribe Narcan nasal spray for emergency use in case of opioid overdose. - Educate on the use of Narcan and monitor for signs of overdose.  Herpes Zoster (Shingles) Recently experienced shingles along left flank. Treated with acyclovir and topical Kenalog. Advised to increase prednisone during illness to prevent adrenal crisis. - Ensure completion of acyclovir course.  Plan - Lab reviewed, will proceed Eligard injection today -Will follow-up his PSA level  Scheduled for regular follow-ups to monitor progression of metastatic prostate cancer and associated conditions. - Schedule follow-up appointment in three months. - Use MyChart to review lab results when available.     SUMMARY OF ONCOLOGIC HISTORY: Oncology History Overview Note   Cancer Staging  Secondary malignant neoplasm of bone and bone marrow (HCC) Staging form: Bone - Appendicular Skeleton, Trunk, Skull, and Facial Bones, AJCC 8th Edition - Clinical: Stage Unknown (cTX, cNX, pM1) - Signed by Benjiman Core, MD on 12/10/2020     Secondary malignant neoplasm of bone and bone marrow (HCC)  08/06/2018 Initial Diagnosis   Secondary malignant neoplasm of bone and bone marrow (  HCC)   12/10/2020 Cancer Staging   Staging form: Bone - Appendicular Skeleton, Trunk,  Skull, and Facial Bones, AJCC 8th Edition - Clinical: Stage Unknown (cTX, cNX, pM1) - Signed by Benjiman Core, MD on 12/10/2020   Prostate cancer (HCC)  09/16/2021 Initial Diagnosis   Prostate cancer [facility])      Discussed the use of AI scribe software for clinical note transcription with the patient, who gave verbal consent to proceed.  History of Present Illness The patient, a 68 year old with metastatic prostate cancer, presents for a routine follow-up. He recently experienced a bout of shingles on his left flank, which was promptly treated with Aciclovir and Kenalog cream. The patient also has a history of low sodium levels, which has previously resulted in hospitalization due to confusion. He is currently managing this with a regimen of Gatorade, sodium chloride, and a low dose of prednisone. The patient also reports constant pain, which is managed with oxycodone taken approximately every four hours. The pain is described as tolerable as long as the medication is taken regularly.     All other systems were reviewed with the patient and are negative.  MEDICAL HISTORY:  Past Medical History:  Diagnosis Date   Allergic rhinitis    At risk for sleep apnea    STOP-BANG SCORE = 5    (routed to pt's pcp 09-08-2018)   Bladder cancer (HCC) dx'd 2020   Chronic constipation    Chronic low back pain    Chronic pain    DOE (dyspnea on exertion)    History of kidney stones    History of recent pneumonia 08/10/2018   acute bronchopneumonia;  follow-up cxr 08-28-2018 in epic   History of vertebral compression fracture    T10 and T12 s/p kyphoplasty   Hx of colonic polyps    Hyperplasia of prostate with lower urinary tract symptoms (LUTS)    Hypertension    Mild persistent asthma    followed by pcp   Numbness in both legs    secondary to a fall, per pt has had work-up and couldn't find anything   Osteopenia    Prostate cancer metastatic to pelvis [facility] urologist-- dr borden/  oncologist  -- dr Clelia Croft   dx 2014 advanced prostate cancer with pelvid adenopathy,  Stage T2c, Gleason 7;  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   Wears glasses     SURGICAL HISTORY: Past Surgical History:  Procedure Laterality Date   COLONOSCOPY     IR KYPHO EA ADDL LEVEL THORACIC OR LUMBAR  08/17/2018   IR KYPHO EA ADDL LEVEL THORACIC OR LUMBAR  08/26/2018   IR KYPHO LUMBAR INC FX REDUCE BONE BX UNI/BIL CANNULATION INC/IMAGING  08/26/2018   IR KYPHO THORACIC WITH BONE BIOPSY  08/17/2018   IR RADIOLOGIST EVAL & MGMT  08/11/2018   IR RADIOLOGIST EVAL & MGMT  08/24/2018   LYMPHADENECTOMY Left 01/05/2013   Procedure: ROBOTIC LYMPHADENECTOMY;  Surgeon: Crecencio Mc, MD;  Location: [facility]S;  Service: Urology;  Laterality: Left;   PROSTATE BIOPSY     11/12 positive biopsies   ROTATOR CUFF REPAIR Left 2012   TRANSURETHRAL RESECTION OF BLADDER TUMOR N/A 09/15/2018   Procedure: TRANSURETHRAL RESECTION OF BLADDER TUMOR (TURBT)WITH CYSTOSCOPY/ POST OPERATIVE INSTILLATION OF GEMCITABINE;  Surgeon: Heloise Purpura, MD;  Location: [facility] ORS;  Service: Urology;  Laterality: N/A;  GENERAL ANESTHESIA WITH PARALYSIS   UMBILICAL HERNIA REPAIR  09-02-2001   dr t. price @[facility]     I have reviewed the social history  and family history with the patient and they are unchanged from previous note.  ALLERGIES:  is allergic to amlodipine and tetracycline.  MEDICATIONS:  Current Outpatient Medications  Medication Sig Dispense Refill   abiraterone acetate (ZYTIGA) 250 MG tablet TAKE 4 TABLETS (1,000 MG TOTAL) BY MOUTH DAILY. 120 tablet 0   acetaminophen (TYLENOL) 325 MG tablet Take 325 mg by mouth daily as needed for fever.     albuterol (PROVENTIL) (2.5 MG/3ML) 0.083% nebulizer solution Take 3 mLs (2.5 mg total) by nebulization every 6 (six) hours as needed for wheezing or shortness of breath. 360 mL 2   albuterol (VENTOLIN HFA) 108 (90 Base) MCG/ACT inhaler Inhale 2 puffs into the lungs every 6 (six) hours as needed for wheezing or shortness of  breath. 18 g 3   azelastine (ASTELIN) 0.1 % nasal spray Place 1 spray into both nostrils 2 (two) times daily. Use in each nostril as directed 90 mL 3   CALCIUM-VITAMIN D PO Take 1 tablet by mouth 2 (two) times daily.      carvedilol (COREG) 3.125 MG tablet TAKE ONE TABLET TWICE DAILY WITH FOOD 180 tablet 0   denosumab (PROLIA) 60 MG/ML SOSY injection Inject 60 mg into the skin every 6 (six) months.     docusate sodium (COLACE) 50 MG capsule Take 50 mg by mouth daily.     DULoxetine (CYMBALTA) 20 MG capsule TAKE ONE CAPSULE DAILY 30 capsule 1   Famotidine (PEPCID AC PO) Take 1 tablet by mouth at bedtime.     fluticasone (FLONASE) 50 MCG/ACT nasal spray Place 2 sprays into both nostrils daily. 48 g 1   Fluticasone-Umeclidin-Vilant (TRELEGY ELLIPTA) 100-62.5-25 MCG/ACT AEPB Inhale 1 puff into the lungs daily. 120 each 3   furosemide (LASIX) 20 MG tablet TAKE ONE TABLET TWICE DAILY 180 tablet 0   leuprolide (LUPRON) 11.25 MG injection Inject 11.25 mg into the muscle every 3 (three) months.     linaclotide (LINZESS) 145 MCG CAPS capsule Take 1 capsule (145 mcg total) by mouth daily before breakfast. 30 capsule 2   meclizine (ANTIVERT) 12.5 MG tablet Take 1 tablet (12.5 mg total) by mouth 3 (three) times daily as needed for dizziness. 270 tablet 0   Multiple Vitamins-Minerals (CENTRUM SILVER 50+MEN PO) Take 1 tablet by mouth daily.     olmesartan (BENICAR) 20 MG tablet Take 1 tablet (20 mg total) by mouth 2 (two) times daily. 180 tablet 1   oxyCODONE (OXY IR/ROXICODONE) 5 MG immediate release tablet Take 1 tablet (5 mg) by mouth every 4 hours as needed for severe pain. 180 tablet 0   polyethylene glycol powder (GLYCOLAX/MIRALAX) 17 GM/SCOOP powder Take 17 g (1 capful) by mouth daily as directed. 510 g 6   predniSONE (DELTASONE) 5 MG tablet TAKE 1 TABLET 2 TIMES A DAY 90 tablet 3   rosuvastatin (CRESTOR) 10 MG tablet TAKE ONE TABLET BY MOUTH DAILY 90 tablet 0   sodium chloride 1 g tablet Take 1 g by  mouth 2 (two) times daily with a meal.     solifenacin (VESICARE) 5 MG tablet Take 5 mg by mouth daily.     Specialty Vitamins Products (VISTA ADVANCED CAROTENOID) CAPS Take 1 capsule by mouth every evening.     triamcinolone cream (KENALOG) 0.1 % Apply 1 Application topically 2 (two) times daily. 15 g 0   vitamin C (ASCORBIC ACID) 500 MG tablet Take 500 mg by mouth every evening.     vitamin E 180 MG (400 UNITS)  capsule Take 400 Units by mouth every evening.     NARCAN 4 MG/0.1ML LIQD nasal spray kit Place 0.1 sprays (0.4 mg total) into the nose once as needed (overdose). 1 each 0   No current facility-administered medications for this visit.    PHYSICAL EXAMINATION: ECOG PERFORMANCE STATUS: 2 - Symptomatic, <50% confined to bed  Vitals:   11/09/23 1024  BP: 130/78  Pulse: 84  Resp: (!) 21  Temp: 98.1 F (36.7 C)  SpO2: 99%   Wt Readings from Last 3 Encounters:  11/09/23 209 lb 11.2 oz (95.1 kg)  10/13/23 208 lb 6.4 oz (94.5 kg)  10/04/23 212 lb (96.2 kg)     GENERAL:alert, no distress and comfortable SKIN: skin color, texture, turgor are normal, no rashes or significant lesions EYES: normal, Conjunctiva are pink and non-injected, sclera clear NECK: supple, thyroid normal size, non-tender, without nodularity LYMPH:  no palpable lymphadenopathy in the cervical, axillary  LUNGS: clear to auscultation and percussion with normal breathing effort HEART: regular rate & rhythm and no murmurs and no lower extremity edema ABDOMEN:abdomen soft, non-tender and normal bowel sounds Musculoskeletal:no cyanosis of digits and no clubbing  NEURO: alert & oriented x 3 with fluent speech, no focal motor/sensory deficits  Physical Exam    LABORATORY DATA:  I have reviewed the data as listed    Latest Ref Rng & Units 11/09/2023   10:02 AM 10/13/2023   11:51 AM 10/04/2023    8:51 PM  CBC  WBC 4.0 - 10.5 K/uL 10.9  10.0  8.6   Hemoglobin 13.0 - 17.0 g/dL 57.3  22.0  25.4   Hematocrit  39.0 - 52.0 % 38.7  38.9  34.5   Platelets 150 - 400 K/uL 280  370.0  257         Latest Ref Rng & Units 11/09/2023   10:02 AM 10/13/2023   11:51 AM 10/04/2023    8:51 PM  CMP  Glucose 70 - 99 mg/dL 270  623  762   BUN 8 - 23 mg/dL 15  19  7    Creatinine 0.61 - 1.24 mg/dL 8.31  5.17  6.16   Sodium 135 - 145 mmol/L 134  134  131   Potassium 3.5 - 5.1 mmol/L 3.8  5.2  3.7   Chloride 98 - 111 mmol/L 100  100  99   CO2 22 - 32 mmol/L 29  28  22    Calcium 8.9 - 10.3 mg/dL 9.7  07.3  8.8   Total Protein 6.5 - 8.1 g/dL 6.9  6.9    Total Bilirubin 0.0 - 1.2 mg/dL 0.4  0.5    Alkaline Phos 38 - 126 U/L 39  44    AST 15 - 41 U/L 14  19    ALT 0 - 44 U/L 18  19        RADIOGRAPHIC STUDIES: I have personally reviewed the radiological images as listed and agreed with the findings in the report. No results found.    Orders Placed This Encounter  Procedures   Testosterone, free, total    Standing Status:   Future    Number of Occurrences:   1    Expected Date:   11/09/2023    Expiration Date:   11/08/2024   All questions were answered. The patient knows to call the clinic with any problems, questions or concerns. No barriers to learning was detected. The total time spent in the appointment was 25 minutes.  Sonja [place], MD 11/09/2023

## 2023-11-09 NOTE — Assessment & Plan Note (Signed)
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  -He developed castration resistant in 2018.  His PSA was up to 11.3 at that time.  -He is status post T10, T12, kyphoplasty (osteoprosis related) completed on January 22 of 2020.  -he has been on Zytiga 1000 mg daily with prednisone at 5 mg daily started in 2018, Eligard 22.5 mg every 3 months and Prolia 60 mg every 6 months.  -Tolerating treatment well. His PSA has been slowly trending up with latest 2.2 in July 2024, with doubling time more than 6 months.  Will continue current therapy.

## 2023-11-11 LAB — PROSTATE-SPECIFIC AG, SERUM (LABCORP): Prostate Specific Ag, Serum: 3 ng/mL (ref 0.0–4.0)

## 2023-11-12 ENCOUNTER — Other Ambulatory Visit (HOSPITAL_COMMUNITY): Payer: Self-pay

## 2023-11-12 LAB — TESTOSTERONE, FREE, TOTAL, SHBG
Sex Hormone Binding: 36.2 nmol/L (ref 19.3–76.4)
Testosterone, Free: <0.2 pg/mL — ABNORMAL LOW (ref 6.6–18.1)
Testosterone: <3 ng/dL — ABNORMAL LOW (ref 264–916)

## 2023-11-16 ENCOUNTER — Ambulatory Visit: Payer: Medicare Other | Admitting: Internal Medicine

## 2023-12-02 ENCOUNTER — Other Ambulatory Visit: Payer: Self-pay | Admitting: Internal Medicine

## 2023-12-02 DIAGNOSIS — T402X5A Adverse effect of other opioids, initial encounter: Secondary | ICD-10-CM

## 2023-12-05 DIAGNOSIS — J189 Pneumonia, unspecified organism: Secondary | ICD-10-CM | POA: Diagnosis not present

## 2023-12-05 DIAGNOSIS — E669 Obesity, unspecified: Secondary | ICD-10-CM | POA: Diagnosis not present

## 2023-12-05 DIAGNOSIS — K5903 Drug induced constipation: Secondary | ICD-10-CM | POA: Diagnosis not present

## 2023-12-05 DIAGNOSIS — J4489 Other specified chronic obstructive pulmonary disease: Secondary | ICD-10-CM | POA: Diagnosis not present

## 2023-12-06 ENCOUNTER — Ambulatory Visit (INDEPENDENT_AMBULATORY_CARE_PROVIDER_SITE_OTHER): Admitting: Internal Medicine

## 2023-12-06 ENCOUNTER — Encounter: Payer: Self-pay | Admitting: Internal Medicine

## 2023-12-06 VITALS — BP 118/76 | HR 75 | Temp 98.6°F | Ht 68.0 in | Wt 210.6 lb

## 2023-12-06 DIAGNOSIS — R7989 Other specified abnormal findings of blood chemistry: Secondary | ICD-10-CM

## 2023-12-06 DIAGNOSIS — Z8781 Personal history of (healed) traumatic fracture: Secondary | ICD-10-CM | POA: Insufficient documentation

## 2023-12-06 DIAGNOSIS — E271 Primary adrenocortical insufficiency: Secondary | ICD-10-CM

## 2023-12-06 DIAGNOSIS — I10 Essential (primary) hypertension: Secondary | ICD-10-CM

## 2023-12-06 DIAGNOSIS — M810 Age-related osteoporosis without current pathological fracture: Secondary | ICD-10-CM | POA: Insufficient documentation

## 2023-12-06 DIAGNOSIS — Z7952 Long term (current) use of systemic steroids: Secondary | ICD-10-CM | POA: Insufficient documentation

## 2023-12-06 DIAGNOSIS — E2740 Unspecified adrenocortical insufficiency: Secondary | ICD-10-CM | POA: Insufficient documentation

## 2023-12-06 LAB — CORTISOL: Cortisol, Plasma: 2.3 ug/dL

## 2023-12-06 NOTE — Progress Notes (Unsigned)
 Subjective:  Patient ID: [name], male    DOB: [birth year]  Age: 68 y.o. MRN: [id number]  CC: Medical Management of Chronic Issues (3 month follow up. Patient was seen and treated for shingles. )   HPI [name] Holder presents for f/up ---  Discussed the use of AI scribe software for clinical note transcription with the patient, who gave verbal consent to proceed.  History of Present Illness   [name] Holder is a 68 year old male who presents for follow-up after a recent mild case of shingles. He is accompanied by his spouse.  He experienced a mild case of shingles located on his rib cage and around to the back, which was treated promptly. It has been one to two weeks since the onset, and he no longer has any lingering pain or rash. He had previously received the shingles vaccine.  He notes a slight, gradual weight loss from 212-213 pounds to 210 pounds, without actively trying to lose weight.  He has a history of anemia, which remains stable, with his last CBC showing low but normal range levels. He also has a history of a heart attack and is monitoring his PSA levels, which have recently decreased to 3.0.  He recently experienced a fall at home, attributing it to not paying attention to his surroundings and the presence of dog toys. He is planning home renovations to improve safety, including removing carpets and relocating the washer and dryer upstairs.  He lives with his spouse and has two dogs.       Outpatient Medications Prior to Visit  Medication Sig Dispense Refill   acetaminophen  (TYLENOL ) 325 MG tablet Take 325 mg by mouth daily as needed for fever.     albuterol  (PROVENTIL ) (2.5 MG/3ML) 0.083% nebulizer solution Take 3 mLs (2.5 mg total) by nebulization every 6 (six) hours as needed for wheezing or shortness of breath. 360 mL 2   albuterol  (VENTOLIN  HFA) 108 (90 Base) MCG/ACT inhaler Inhale 2 puffs into the lungs every 6 (six) hours as needed for wheezing or  shortness of breath. 18 g 3   azelastine  (ASTELIN ) 0.1 % nasal spray Place 1 spray into both nostrils 2 (two) times daily. Use in each nostril as directed 90 mL 3   CALCIUM -VITAMIN D  PO Take 1 tablet by mouth 2 (two) times daily.      carvedilol  (COREG ) 3.125 MG tablet TAKE ONE TABLET TWICE DAILY WITH FOOD 180 tablet 0   denosumab  (PROLIA ) 60 MG/ML SOSY injection Inject 60 mg into the skin every 6 (six) months.     docusate sodium  (COLACE) 50 MG capsule Take 50 mg by mouth daily.     DULoxetine  (CYMBALTA ) 20 MG capsule TAKE ONE CAPSULE DAILY 30 capsule 1   Famotidine  (PEPCID  AC PO) Take 1 tablet by mouth at bedtime.     fluticasone  (FLONASE ) 50 MCG/ACT nasal spray Place 2 sprays into both nostrils daily. 48 g 1   Fluticasone -Umeclidin-Vilant (TRELEGY ELLIPTA ) 100-62.5-25 MCG/ACT AEPB Inhale 1 puff into the lungs daily. 120 each 3   furosemide  (LASIX ) 20 MG tablet TAKE ONE TABLET TWICE DAILY 180 tablet 0   leuprolide  (LUPRON ) 11.25 MG injection Inject 11.25 mg into the muscle every 3 (three) months.     meclizine  (ANTIVERT ) 12.5 MG tablet Take 1 tablet (12.5 mg total) by mouth 3 (three) times daily as needed for dizziness. 270 tablet 0   Multiple Vitamins-Minerals (CENTRUM SILVER 50+MEN PO) Take 1 tablet by mouth daily.  NARCAN  4 MG/0.1ML LIQD nasal spray kit Place 0.1 sprays (0.4 mg total) into the nose once as needed (overdose). 1 each 0   olmesartan  (BENICAR ) 20 MG tablet Take 1 tablet (20 mg total) by mouth 2 (two) times daily. 180 tablet 1   oxyCODONE  (OXY IR/ROXICODONE ) 5 MG immediate release tablet Take 1 tablet (5 mg) by mouth every 4 hours as needed for severe pain. 180 tablet 0   polyethylene glycol powder (GLYCOLAX /MIRALAX ) 17 GM/SCOOP powder Take 17 g (1 capful) by mouth daily as directed. 510 g 6   predniSONE  (DELTASONE ) 5 MG tablet TAKE 1 TABLET 2 TIMES A DAY 90 tablet 3   rosuvastatin  (CRESTOR ) 10 MG tablet TAKE ONE TABLET BY MOUTH DAILY 90 tablet 0   solifenacin (VESICARE) 5 MG  tablet Take 5 mg by mouth daily.     triamcinolone  cream (KENALOG ) 0.1 % Apply 1 Application topically 2 (two) times daily. 15 g 0   abiraterone  acetate (ZYTIGA ) 250 MG tablet TAKE 4 TABLETS (1,000 MG TOTAL) BY MOUTH DAILY. 120 tablet 0   linaclotide  (LINZESS ) 145 MCG CAPS capsule Take 1 capsule (145 mcg total) by mouth daily before breakfast. 30 capsule 2   sodium chloride  1 g tablet Take 1 g by mouth 2 (two) times daily with a meal.     Specialty Vitamins Products (VISTA ADVANCED CAROTENOID) CAPS Take 1 capsule by mouth every evening.     vitamin C (ASCORBIC ACID ) 500 MG tablet Take 500 mg by mouth every evening.     vitamin E  180 MG (400 UNITS) capsule Take 400 Units by mouth every evening.     No facility-administered medications prior to visit.    ROS Review of Systems  Constitutional:  Negative for appetite change, chills, diaphoresis and fever.  HENT: Negative.    Eyes: Negative.   Respiratory: Negative.  Negative for cough, chest tightness, shortness of breath and wheezing.   Cardiovascular:  Negative for chest pain, palpitations and leg swelling.  Gastrointestinal:  Negative for abdominal pain, constipation, diarrhea, nausea and vomiting.  Genitourinary: Negative.  Negative for difficulty urinating.  Musculoskeletal:  Positive for back pain and gait problem. Negative for myalgias and neck pain.  Skin: Negative.   Neurological:  Negative for dizziness, weakness and light-headedness.  Hematological:  Negative for adenopathy. Does not bruise/bleed easily.  Psychiatric/Behavioral: Negative.      Objective:  BP 118/76 (BP Location: Left Arm, Patient Position: Sitting, Cuff Size: Normal)   Pulse 75   Temp 98.6 F (37 C) (Oral)   Ht 5\' 8"  (1.727 m)   Wt 210 lb 9.6 oz (95.5 kg)   SpO2 97%   BMI 32.02 kg/m   BP Readings from Last 3 Encounters:  12/06/23 118/76  11/09/23 130/78  10/13/23 120/72    Wt Readings from Last 3 Encounters:  12/06/23 210 lb 9.6 oz (95.5 kg)   11/09/23 209 lb 11.2 oz (95.1 kg)  10/13/23 208 lb 6.4 oz (94.5 kg)    Physical Exam Vitals reviewed.  Constitutional:      General: He is not in acute distress.    Appearance: He is ill-appearing (in a wheelchair). He is not toxic-appearing or diaphoretic.  HENT:     Mouth/Throat:     Mouth: Mucous membranes are moist.  Eyes:     General: No scleral icterus.    Conjunctiva/sclera: Conjunctivae normal.  Cardiovascular:     Rate and Rhythm: Normal rate and regular rhythm.     Heart sounds: No murmur  heard.    No gallop.  Pulmonary:     Effort: Pulmonary effort is normal.     Breath sounds: No stridor. No wheezing, rhonchi or rales.  Abdominal:     Palpations: There is no mass.     Tenderness: There is no abdominal tenderness. There is no guarding.     Hernia: No hernia is present.  Musculoskeletal:        General: Normal range of motion.     Cervical back: Neck supple.     Right lower leg: No edema.     Left lower leg: No edema.  Skin:    General: Skin is warm and dry.  Neurological:     General: No focal deficit present.     Mental Status: He is alert. Mental status is at baseline.  Psychiatric:        Mood and Affect: Mood normal.        Behavior: Behavior normal.     Lab Results  Component Value Date   WBC 10.9 (H) 11/09/2023   HGB 13.2 11/09/2023   HCT 38.7 (L) 11/09/2023   PLT 280 11/09/2023   GLUCOSE 114 (H) 11/09/2023   CHOL 191 05/17/2023   TRIG 157.0 (H) 05/17/2023   HDL 44.20 05/17/2023   LDLCALC 115 (H) 05/17/2023   ALT 18 11/09/2023   AST 14 (L) 11/09/2023   NA 134 (L) 11/09/2023   K 3.8 11/09/2023   CL 100 11/09/2023   CREATININE 0.90 11/09/2023   BUN 15 11/09/2023   CO2 29 11/09/2023   TSH 0.55 08/18/2023   PSA 3.4 08/12/2023   INR 1.1 09/17/2021   HGBA1C 5.6 12/01/2022    MR BRAIN W WO CONTRAST Result Date: 10/05/2023 CLINICAL DATA:  Mental status change with unknown cause. History of bladder cancer EXAM: MRI HEAD WITHOUT AND WITH  CONTRAST TECHNIQUE: Multiplanar, multiecho pulse sequences of the brain and surrounding structures were obtained without and with intravenous contrast. CONTRAST:  10mL GADAVIST  GADOBUTROL  1 MMOL/ML IV SOLN COMPARISON:  Head CT from earlier today FINDINGS: Brain: No acute infarction, hemorrhage, hydrocephalus, extra-axial collection or mass lesion. No abnormal enhancement. Mild chronic small vessel ischemia in the cerebral white matter. Vascular: Normal flow voids. Small vertebral and basilar arteries in the setting of fetal type PCA flow bilaterally. Skull and upper cervical spine: Normal marrow signal Sinuses/Orbits: Negative Other: Motion artifact. IMPRESSION: No acute finding or specific cause for symptoms. No evidence of metastatic disease. Intermittent motion artifact Electronically Signed   By: Ronnette Coke M.D.   On: 10/05/2023 06:21   DG Chest Portable 1 View Result Date: 10/05/2023 CLINICAL DATA:  Shortness of breath EXAM: PORTABLE CHEST 1 VIEW COMPARISON:  08/25/2023 FINDINGS: Left basilar atelectasis. Normal cardiomediastinal contours. No focal airspace consolidation or pulmonary edema. No pleural effusion or pneumothorax. IMPRESSION: Left basilar atelectasis. Electronically Signed   By: Juanetta Nordmann M.D.   On: 10/05/2023 03:18   CT Head Wo Contrast Result Date: 10/05/2023 CLINICAL DATA:  Altered mental status EXAM: CT HEAD WITHOUT CONTRAST TECHNIQUE: Contiguous axial images were obtained from the base of the skull through the vertex without intravenous contrast. RADIATION DOSE REDUCTION: This exam was performed according to the departmental dose-optimization program which includes automated exposure control, adjustment of the mA and/or kV according to patient size and/or use of iterative reconstruction technique. COMPARISON:  09/16/2021 FINDINGS: Brain: No mass,hemorrhage or extra-axial collection. Normal appearance of the parenchyma and CSF spaces. Vascular: No hyperdense vessel or unexpected  vascular calcification. Skull:  The visualized skull base, calvarium and extracranial soft tissues are normal. Sinuses/Orbits: No fluid levels or advanced mucosal thickening of the visualized paranasal sinuses. No mastoid or middle ear effusion. Normal orbits. Other: None. IMPRESSION: Normal head CT. Electronically Signed   By: Juanetta Nordmann M.D.   On: 10/05/2023 03:17    Assessment & Plan:   Low serum cortisol level- Level is stable. BP is normal. I don't think he has AI. -     Cortisol; Future  Essential hypertension- His BP is well controlled.     Follow-up: Return in about 3 months (around 03/07/2024).  Sandra Crouch, MD

## 2023-12-06 NOTE — Patient Instructions (Signed)

## 2023-12-07 ENCOUNTER — Other Ambulatory Visit: Payer: Self-pay | Admitting: Hematology

## 2023-12-07 DIAGNOSIS — Z8546 Personal history of malignant neoplasm of prostate: Secondary | ICD-10-CM

## 2023-12-09 ENCOUNTER — Ambulatory Visit: Payer: Self-pay | Admitting: Internal Medicine

## 2023-12-25 ENCOUNTER — Encounter: Payer: Self-pay | Admitting: Nurse Practitioner

## 2023-12-25 ENCOUNTER — Telehealth: Admitting: Nurse Practitioner

## 2023-12-25 DIAGNOSIS — J690 Pneumonitis due to inhalation of food and vomit: Secondary | ICD-10-CM

## 2023-12-25 MED ORDER — AMOXICILLIN-POT CLAVULANATE 875-125 MG PO TABS: 1.0000 | ORAL_TABLET | Freq: Two times a day (BID) | ORAL | 0 refills | Status: AC

## 2023-12-25 NOTE — Progress Notes (Signed)
 Virtual Visit Consent   [name] Holder, you are scheduled for a virtual visit with a [facility] provider today. Just as with appointments in the office, your consent must be obtained to participate. Your consent will be active for this visit and any virtual visit you may have with one of our providers in the next 365 days. If you have a MyChart account, a copy of this consent can be sent to you electronically.  As this is a virtual visit, video technology does not allow for your provider to perform a traditional examination. This may limit your provider's ability to fully assess your condition. If your provider identifies any concerns that need to be evaluated in person or the need to arrange testing (such as labs, EKG, etc.), we will make arrangements to do so. Although advances in technology are sophisticated, we cannot ensure that it will always work on either your end or our end. If the connection with a video visit is poor, the visit may have to be switched to a telephone visit. With either a video or telephone visit, we are not always able to ensure that we have a secure connection.  By engaging in this virtual visit, you consent to the provision of healthcare and authorize for your insurance to be billed (if applicable) for the services provided during this visit. Depending on your insurance coverage, you may receive a charge related to this service.  I need to obtain your verbal consent now. Are you willing to proceed with your visit today? [name] Holder has provided verbal consent on 12/25/2023 for a virtual visit (video or telephone). [name] Dean, NP  Date: 12/25/2023 3:02 PM   Virtual Visit via Video Note   I, [name] Holder, connected with  [name] Holder  ([id number], 07-19-1956) on 12/25/23 at  2:45 PM EDT by a video-enabled telemedicine application and verified that I am speaking with the correct person using two identifiers.  Location: Patient: Virtual Visit Location  Patient: Home Provider: Virtual Visit Location Provider: Home Office   I discussed the limitations of evaluation and management by telemedicine and the availability of in person appointments. The patient expressed understanding and agreed to proceed.    History of Present Illness: [name] Holder is a 68 y.o. who identifies as a male who was assigned male at birth, and is being seen today for possible aspiration  [name] Holder states he has severe symptoms of GERD. The night before last he with woke up coughing with burning in his throat. He has a history of aspiration PNA and sepsis per his report and has to sleep in a special upright bed to help prevent stomach contents from coming up through his throat. He is not on a PPI. Over the past 24 hours he has been experiencing right sided chest pain, coughing, throat clearing, shortness of breath (using inhaler) and wheezing.  O2 sats 98% on RA. No chills or fever.    Problems:  Patient Active Problem List   Diagnosis Date Noted   History of vertebral compression fracture 12/06/2023   Long-term corticosteroid use 12/06/2023   Osteoporosis 12/06/2023   Low serum cortisol level 08/25/2023   Adrenal insufficiency (Addison's disease) (HCC) 08/18/2023   Class 2 obesity 09/03/2022   Panic anxiety syndrome 09/02/2022   Therapeutic opioid-induced constipation (OIC) 09/02/2022   Vertigo 06/02/2022   Encounter for general adult medical examination with abnormal findings 10/28/2021   Prostate cancer (HCC) 09/16/2021   SIADH (syndrome of inappropriate  ADH production) (HCC) 09/16/2021   Asthma-COPD overlap syndrome (HCC) 12/19/2019   Anemia of chronic disease 04/20/2019   Hyperglycemia 11/24/2018   Pathologic compression fracture of spine, initial encounter (HCC) 09/13/2018   Cancer associated pain 09/13/2018   Secondary malignant neoplasm of bone and bone marrow (HCC) 08/06/2018   Spinal stenosis in cervical region 04/12/2018   Hyperlipidemia with  target LDL less than 100 12/21/2013   Essential hypertension 05/20/2010   Allergic rhinitis 05/20/2010   Asthma, mild intermittent 05/20/2010    Allergies:  Allergies  Allergen Reactions   Amlodipine  Swelling    edema   Tetracycline Rash   Medications:  Current Outpatient Medications:    amoxicillin -clavulanate (AUGMENTIN ) 875-125 MG tablet, Take 1 tablet by mouth 2 (two) times daily for 5 days., Disp: 10 tablet, Rfl: 0   abiraterone  acetate (ZYTIGA ) 250 MG tablet, TAKE 4 TABLETS (1,000 MG TOTAL) BY MOUTH DAILY., Disp: 120 tablet, Rfl: 0   acetaminophen  (TYLENOL ) 325 MG tablet, Take 325 mg by mouth daily as needed for fever., Disp: , Rfl:    albuterol  (PROVENTIL ) (2.5 MG/3ML) 0.083% nebulizer solution, Take 3 mLs (2.5 mg total) by nebulization every 6 (six) hours as needed for wheezing or shortness of breath., Disp: 360 mL, Rfl: 2   albuterol  (VENTOLIN  HFA) 108 (90 Base) MCG/ACT inhaler, Inhale 2 puffs into the lungs every 6 (six) hours as needed for wheezing or shortness of breath., Disp: 18 g, Rfl: 3   azelastine  (ASTELIN ) 0.1 % nasal spray, Place 1 spray into both nostrils 2 (two) times daily. Use in each nostril as directed, Disp: 90 mL, Rfl: 3   CALCIUM -VITAMIN D  PO, Take 1 tablet by mouth 2 (two) times daily. , Disp: , Rfl:    carvedilol  (COREG ) 3.125 MG tablet, TAKE ONE TABLET TWICE DAILY WITH FOOD, Disp: 180 tablet, Rfl: 0   denosumab  (PROLIA ) 60 MG/ML SOSY injection, Inject 60 mg into the skin every 6 (six) months., Disp: , Rfl:    docusate sodium  (COLACE) 50 MG capsule, Take 50 mg by mouth daily., Disp: , Rfl:    DULoxetine  (CYMBALTA ) 20 MG capsule, TAKE ONE CAPSULE DAILY, Disp: 30 capsule, Rfl: 1   Famotidine  (PEPCID  AC PO), Take 1 tablet by mouth at bedtime., Disp: , Rfl:    fluticasone  (FLONASE ) 50 MCG/ACT nasal spray, Place 2 sprays into both nostrils daily., Disp: 48 g, Rfl: 1   Fluticasone -Umeclidin-Vilant (TRELEGY ELLIPTA ) 100-62.5-25 MCG/ACT AEPB, Inhale 1 puff into the  lungs daily., Disp: 120 each, Rfl: 3   furosemide  (LASIX ) 20 MG tablet, TAKE ONE TABLET TWICE DAILY, Disp: 180 tablet, Rfl: 0   leuprolide  (LUPRON ) 11.25 MG injection, Inject 11.25 mg into the muscle every 3 (three) months., Disp: , Rfl:    LINZESS  145 MCG CAPS capsule, TAKE ONE CAPSULE DAILY BEFORE BREAKFAST, Disp: 30 capsule, Rfl: 2   meclizine  (ANTIVERT ) 12.5 MG tablet, Take 1 tablet (12.5 mg total) by mouth 3 (three) times daily as needed for dizziness., Disp: 270 tablet, Rfl: 0   Multiple Vitamins-Minerals (CENTRUM SILVER 50+MEN PO), Take 1 tablet by mouth daily., Disp: , Rfl:    NARCAN  4 MG/0.1ML LIQD nasal spray kit, Place 0.1 sprays (0.4 mg total) into the nose once as needed (overdose)., Disp: 1 each, Rfl: 0   olmesartan  (BENICAR ) 20 MG tablet, Take 1 tablet (20 mg total) by mouth 2 (two) times daily., Disp: 180 tablet, Rfl: 1   oxyCODONE  (OXY IR/ROXICODONE ) 5 MG immediate release tablet, Take 1 tablet (5 mg) by mouth  every 4 hours as needed for severe pain., Disp: 180 tablet, Rfl: 0   polyethylene glycol powder (GLYCOLAX /MIRALAX ) 17 GM/SCOOP powder, Take 17 g (1 capful) by mouth daily as directed., Disp: 510 g, Rfl: 6   predniSONE  (DELTASONE ) 5 MG tablet, TAKE 1 TABLET 2 TIMES A DAY, Disp: 90 tablet, Rfl: 3   rosuvastatin  (CRESTOR ) 10 MG tablet, TAKE ONE TABLET BY MOUTH DAILY, Disp: 90 tablet, Rfl: 0   solifenacin (VESICARE) 5 MG tablet, Take 5 mg by mouth daily., Disp: , Rfl:    triamcinolone  cream (KENALOG ) 0.1 %, Apply 1 Application topically 2 (two) times daily., Disp: 15 g, Rfl: 0  Observations/Objective: Patient is well-developed, well-nourished in no acute distress.  Resting comfortably at home.  Head is normocephalic, atraumatic.  No labored breathing.  Speech is clear and coherent with logical content.  Patient is alert and oriented at baseline.    Assessment and Plan: 1. Aspiration pneumonia due to gastric secretions, unspecified laterality, unspecified part of lung (HCC)  (Primary) - amoxicillin -clavulanate (AUGMENTIN ) 875-125 MG tablet; Take 1 tablet by mouth 2 (two) times daily for 5 days.  Dispense: 10 tablet; Refill: 0 He states he can not get to urgent care or ER as wife has eye surgery and can not drive until tomorrow.   Follow Up Instructions: I discussed the assessment and treatment plan with the patient. The patient was provided an opportunity to ask questions and all were answered. The patient agreed with the plan and demonstrated an understanding of the instructions.  A copy of instructions were sent to the patient via MyChart unless otherwise noted below.    The patient was advised to call back or seek an in-person evaluation if the symptoms worsen or if the condition fails to improve as anticipated.    Izael Bessinger W Roczen Waymire, NP

## 2023-12-25 NOTE — Patient Instructions (Signed)
 [name] Holder, thank you for joining Collins Dean, NP for today's virtual visit.  While this provider is not your primary care provider (PCP), if your PCP is located in our provider database this encounter information will be shared with them immediately following your visit.   A [facility] MyChart account gives you access to today's visit and all your visits, tests, and labs performed at [facility] " click here if you don't have a [facility] MyChart account or go to [email or web address]  Consent: (Patient) [name] Holder provided verbal consent for this virtual visit at the beginning of the encounter.  Current Medications:  Current Outpatient Medications:    amoxicillin -clavulanate (AUGMENTIN ) 875-125 MG tablet, Take 1 tablet by mouth 2 (two) times daily for 5 days., Disp: 10 tablet, Rfl: 0   abiraterone  acetate (ZYTIGA ) 250 MG tablet, TAKE 4 TABLETS (1,000 MG TOTAL) BY MOUTH DAILY., Disp: 120 tablet, Rfl: 0   acetaminophen  (TYLENOL ) 325 MG tablet, Take 325 mg by mouth daily as needed for fever., Disp: , Rfl:    albuterol  (PROVENTIL ) (2.5 MG/3ML) 0.083% nebulizer solution, Take 3 mLs (2.5 mg total) by nebulization every 6 (six) hours as needed for wheezing or shortness of breath., Disp: 360 mL, Rfl: 2   albuterol  (VENTOLIN  HFA) 108 (90 Base) MCG/ACT inhaler, Inhale 2 puffs into the lungs every 6 (six) hours as needed for wheezing or shortness of breath., Disp: 18 g, Rfl: 3   azelastine  (ASTELIN ) 0.1 % nasal spray, Place 1 spray into both nostrils 2 (two) times daily. Use in each nostril as directed, Disp: 90 mL, Rfl: 3   CALCIUM -VITAMIN D  PO, Take 1 tablet by mouth 2 (two) times daily. , Disp: , Rfl:    carvedilol  (COREG ) 3.125 MG tablet, TAKE ONE TABLET TWICE DAILY WITH FOOD, Disp: 180 tablet, Rfl: 0   denosumab  (PROLIA ) 60 MG/ML SOSY injection, Inject 60 mg into the skin every 6 (six) months., Disp: , Rfl:    docusate sodium  (COLACE) 50 MG capsule, Take 50 mg by  mouth daily., Disp: , Rfl:    DULoxetine  (CYMBALTA ) 20 MG capsule, TAKE ONE CAPSULE DAILY, Disp: 30 capsule, Rfl: 1   Famotidine  (PEPCID  AC PO), Take 1 tablet by mouth at bedtime., Disp: , Rfl:    fluticasone  (FLONASE ) 50 MCG/ACT nasal spray, Place 2 sprays into both nostrils daily., Disp: 48 g, Rfl: 1   Fluticasone -Umeclidin-Vilant (TRELEGY ELLIPTA ) 100-62.5-25 MCG/ACT AEPB, Inhale 1 puff into the lungs daily., Disp: 120 each, Rfl: 3   furosemide  (LASIX ) 20 MG tablet, TAKE ONE TABLET TWICE DAILY, Disp: 180 tablet, Rfl: 0   leuprolide  (LUPRON ) 11.25 MG injection, Inject 11.25 mg into the muscle every 3 (three) months., Disp: , Rfl:    LINZESS  145 MCG CAPS capsule, TAKE ONE CAPSULE DAILY BEFORE BREAKFAST, Disp: 30 capsule, Rfl: 2   meclizine  (ANTIVERT ) 12.5 MG tablet, Take 1 tablet (12.5 mg total) by mouth 3 (three) times daily as needed for dizziness., Disp: 270 tablet, Rfl: 0   Multiple Vitamins-Minerals (CENTRUM SILVER 50+MEN PO), Take 1 tablet by mouth daily., Disp: , Rfl:    NARCAN  4 MG/0.1ML LIQD nasal spray kit, Place 0.1 sprays (0.4 mg total) into the nose once as needed (overdose)., Disp: 1 each, Rfl: 0   olmesartan  (BENICAR ) 20 MG tablet, Take 1 tablet (20 mg total) by mouth 2 (two) times daily., Disp: 180 tablet, Rfl: 1   oxyCODONE  (OXY IR/ROXICODONE ) 5 MG immediate release tablet, Take 1 tablet (5 mg) by mouth every  4 hours as needed for severe pain., Disp: 180 tablet, Rfl: 0   polyethylene glycol powder (GLYCOLAX /MIRALAX ) 17 GM/SCOOP powder, Take 17 g (1 capful) by mouth daily as directed., Disp: 510 g, Rfl: 6   predniSONE  (DELTASONE ) 5 MG tablet, TAKE 1 TABLET 2 TIMES A DAY, Disp: 90 tablet, Rfl: 3   rosuvastatin  (CRESTOR ) 10 MG tablet, TAKE ONE TABLET BY MOUTH DAILY, Disp: 90 tablet, Rfl: 0   solifenacin (VESICARE) 5 MG tablet, Take 5 mg by mouth daily., Disp: , Rfl:    triamcinolone  cream (KENALOG ) 0.1 %, Apply 1 Application topically 2 (two) times daily., Disp: 15 g, Rfl: 0    Medications ordered in this encounter:  Meds ordered this encounter  Medications   amoxicillin -clavulanate (AUGMENTIN ) 875-125 MG tablet    Sig: Take 1 tablet by mouth 2 (two) times daily for 5 days.    Dispense:  10 tablet    Refill:  0    Supervising Provider:   Corine Dice [[id number]]     *If you need refills on other medications prior to your next appointment, please contact your pharmacy*  Follow-Up: Call back or seek an in-person evaluation if the symptoms worsen or if the condition fails to improve as anticipated.  [facility] [phone]    If you have been instructed to have an in-person evaluation today at a local Urgent Care facility, please use the link below. It will take you to a list of all of our available [facility], including address, phone number and hours of operation. Please do not delay care.  [facility]  If you or a family member do not have a primary care provider, use the link below to schedule a visit and establish care. When you choose a [facility] primary care physician or advanced practice provider, you gain a long-term partner in health. Find a Primary Care Provider  Learn more about [facility]'s in-office and virtual care options: [facility] Get Care Now

## 2023-12-28 ENCOUNTER — Other Ambulatory Visit: Payer: Self-pay | Admitting: Nurse Practitioner

## 2023-12-28 ENCOUNTER — Telehealth: Payer: Self-pay

## 2023-12-28 ENCOUNTER — Other Ambulatory Visit: Payer: Self-pay

## 2023-12-28 NOTE — Telephone Encounter (Signed)
 Patient called in stating he is in need of a refill of his Oxycodone  5mg  sent to [facility] to be picked up on Friday after his app.

## 2023-12-31 ENCOUNTER — Other Ambulatory Visit: Payer: Self-pay | Admitting: Nurse Practitioner

## 2023-12-31 ENCOUNTER — Telehealth: Payer: Self-pay

## 2023-12-31 ENCOUNTER — Other Ambulatory Visit (HOSPITAL_COMMUNITY): Payer: Self-pay

## 2023-12-31 ENCOUNTER — Other Ambulatory Visit: Payer: Self-pay | Admitting: Hematology

## 2023-12-31 ENCOUNTER — Other Ambulatory Visit: Payer: Self-pay

## 2023-12-31 DIAGNOSIS — C61 Malignant neoplasm of prostate: Secondary | ICD-10-CM | POA: Diagnosis not present

## 2023-12-31 DIAGNOSIS — R351 Nocturia: Secondary | ICD-10-CM | POA: Diagnosis not present

## 2023-12-31 DIAGNOSIS — C7951 Secondary malignant neoplasm of bone: Secondary | ICD-10-CM

## 2023-12-31 DIAGNOSIS — C775 Secondary and unspecified malignant neoplasm of intrapelvic lymph nodes: Secondary | ICD-10-CM | POA: Diagnosis not present

## 2023-12-31 DIAGNOSIS — Z8551 Personal history of malignant neoplasm of bladder: Secondary | ICD-10-CM | POA: Diagnosis not present

## 2023-12-31 MED ORDER — OXYCODONE HCL 5 MG PO TABS
5.0000 mg | ORAL_TABLET | ORAL | 0 refills | Status: DC | PRN
  Filled 2023-12-31: qty 180, 30d supply, fill #0

## 2023-12-31 NOTE — Telephone Encounter (Signed)
 Pt called stating he had called earlier this week requesting a refill on his Oxycodone  but when he went to [facility] today to pick-up the prescription they did not have a refill prescription on file.  Pt called today requesting a refill on his Oxycodone  5mg  IR because he is completely out of medication.  Pt stated he would like the refill to go the [facility].  Stated this nurse will have Dr. Candise Chambers APP send the refill by the close of business today.  Pt verbalized understanding and had no further questions or concerns.

## 2024-01-05 ENCOUNTER — Other Ambulatory Visit: Payer: Self-pay | Admitting: Hematology

## 2024-01-05 DIAGNOSIS — J4489 Other specified chronic obstructive pulmonary disease: Secondary | ICD-10-CM | POA: Diagnosis not present

## 2024-01-05 DIAGNOSIS — K5903 Drug induced constipation: Secondary | ICD-10-CM | POA: Diagnosis not present

## 2024-01-05 DIAGNOSIS — E669 Obesity, unspecified: Secondary | ICD-10-CM | POA: Diagnosis not present

## 2024-01-05 DIAGNOSIS — J189 Pneumonia, unspecified organism: Secondary | ICD-10-CM | POA: Diagnosis not present

## 2024-01-05 DIAGNOSIS — Z8546 Personal history of malignant neoplasm of prostate: Secondary | ICD-10-CM

## 2024-01-18 ENCOUNTER — Ambulatory Visit: Admitting: Internal Medicine

## 2024-01-24 ENCOUNTER — Other Ambulatory Visit: Payer: Self-pay | Admitting: Internal Medicine

## 2024-01-24 DIAGNOSIS — I10 Essential (primary) hypertension: Secondary | ICD-10-CM

## 2024-01-27 DIAGNOSIS — Z03818 Encounter for observation for suspected exposure to other biological agents ruled out: Secondary | ICD-10-CM | POA: Diagnosis not present

## 2024-01-27 DIAGNOSIS — J189 Pneumonia, unspecified organism: Secondary | ICD-10-CM | POA: Diagnosis not present

## 2024-01-27 DIAGNOSIS — R051 Acute cough: Secondary | ICD-10-CM | POA: Diagnosis not present

## 2024-01-31 ENCOUNTER — Other Ambulatory Visit: Payer: Self-pay | Admitting: Internal Medicine

## 2024-01-31 ENCOUNTER — Other Ambulatory Visit: Payer: Self-pay

## 2024-01-31 DIAGNOSIS — E785 Hyperlipidemia, unspecified: Secondary | ICD-10-CM

## 2024-01-31 DIAGNOSIS — I10 Essential (primary) hypertension: Secondary | ICD-10-CM

## 2024-01-31 DIAGNOSIS — C7951 Secondary malignant neoplasm of bone: Secondary | ICD-10-CM

## 2024-01-31 DIAGNOSIS — C61 Malignant neoplasm of prostate: Secondary | ICD-10-CM

## 2024-02-01 ENCOUNTER — Inpatient Hospital Stay

## 2024-02-01 ENCOUNTER — Inpatient Hospital Stay: Attending: Hematology

## 2024-02-01 ENCOUNTER — Inpatient Hospital Stay: Admitting: Hematology

## 2024-02-01 VITALS — BP 111/75 | HR 84 | Temp 97.3°F | Resp 16 | Ht 68.0 in | Wt 211.0 lb

## 2024-02-01 DIAGNOSIS — C7951 Secondary malignant neoplasm of bone: Secondary | ICD-10-CM

## 2024-02-01 DIAGNOSIS — C61 Malignant neoplasm of prostate: Secondary | ICD-10-CM

## 2024-02-01 DIAGNOSIS — Z8546 Personal history of malignant neoplasm of prostate: Secondary | ICD-10-CM

## 2024-02-01 DIAGNOSIS — Z5111 Encounter for antineoplastic chemotherapy: Secondary | ICD-10-CM | POA: Insufficient documentation

## 2024-02-01 DIAGNOSIS — C7952 Secondary malignant neoplasm of bone marrow: Secondary | ICD-10-CM

## 2024-02-01 LAB — CMP (CANCER CENTER ONLY)
ALT: 22 U/L (ref 0–44)
AST: 13 U/L — ABNORMAL LOW (ref 15–41)
Albumin: 3.9 g/dL (ref 3.5–5.0)
Alkaline Phosphatase: 42 U/L (ref 38–126)
Anion gap: 8 (ref 5–15)
BUN: 17 mg/dL (ref 8–23)
CO2: 25 mmol/L (ref 22–32)
Calcium: 9.3 mg/dL (ref 8.9–10.3)
Chloride: 102 mmol/L (ref 98–111)
Creatinine: 0.87 mg/dL (ref 0.61–1.24)
GFR, Estimated: >60 mL/min (ref >60)
Glucose, Bld: 115 mg/dL — ABNORMAL HIGH (ref 70–99)
Potassium: 3.8 mmol/L (ref 3.5–5.1)
Sodium: 135 mmol/L (ref 135–145)
Total Bilirubin: 0.3 mg/dL (ref 0.0–1.2)
Total Protein: 6.3 g/dL — ABNORMAL LOW (ref 6.5–8.1)

## 2024-02-01 LAB — CBC WITH DIFFERENTIAL (CANCER CENTER ONLY)
Abs Immature Granulocytes: 0.12 K/uL — ABNORMAL HIGH (ref 0.00–0.07)
Basophils Absolute: 0 K/uL (ref 0.0–0.1)
Basophils Relative: 0 %
Eosinophils Absolute: 0.1 K/uL (ref 0.0–0.5)
Eosinophils Relative: 1 %
HCT: 37.2 % — ABNORMAL LOW (ref 39.0–52.0)
Hemoglobin: 12.8 g/dL — ABNORMAL LOW (ref 13.0–17.0)
Immature Granulocytes: 1 %
Lymphocytes Relative: 20 %
Lymphs Abs: 2.6 K/uL (ref 0.7–4.0)
MCH: 29.5 pg (ref 26.0–34.0)
MCHC: 34.4 g/dL (ref 30.0–36.0)
MCV: 85.7 fL (ref 80.0–100.0)
Monocytes Absolute: 1.2 K/uL — ABNORMAL HIGH (ref 0.1–1.0)
Monocytes Relative: 10 %
Neutro Abs: 9 K/uL — ABNORMAL HIGH (ref 1.7–7.7)
Neutrophils Relative %: 68 %
Platelet Count: 322 K/uL (ref 150–400)
RBC: 4.34 MIL/uL (ref 4.22–5.81)
RDW: 13.4 % (ref 11.5–15.5)
WBC Count: 13.1 K/uL — ABNORMAL HIGH (ref 4.0–10.5)
nRBC: 0 % (ref 0.0–0.2)

## 2024-02-01 MED ORDER — LEUPROLIDE ACETATE (3 MONTH) 22.5 MG ~~LOC~~ KIT
22.5000 mg | PACK | Freq: Once | SUBCUTANEOUS | Status: AC
  Administered 2024-02-01: 22.5 mg via SUBCUTANEOUS
  Filled 2024-02-01: qty 22.5

## 2024-02-01 MED ORDER — DENOSUMAB 60 MG/ML ~~LOC~~ SOSY
60.0000 mg | PREFILLED_SYRINGE | Freq: Once | SUBCUTANEOUS | Status: AC
  Administered 2024-02-01: 60 mg via SUBCUTANEOUS
  Filled 2024-02-01: qty 1

## 2024-02-01 MED ORDER — PREDNISONE 5 MG PO TABS: 5.0000 mg | ORAL_TABLET | Freq: Two times a day (BID) | ORAL | 3 refills | Status: AC

## 2024-02-01 MED ORDER — OXYCODONE HCL 5 MG PO TABS: 5.0000 mg | ORAL_TABLET | ORAL | 0 refills | Status: DC | PRN

## 2024-02-01 MED ORDER — ABIRATERONE ACETATE 250 MG PO TABS: 1000.0000 mg | ORAL_TABLET | Freq: Every day | ORAL | 2 refills | Status: DC

## 2024-02-01 NOTE — Assessment & Plan Note (Signed)
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  -He developed castration resistant in 2018.  His PSA was up to 11.3 at that time.  -He is status post T10, T12, kyphoplasty (osteoprosis related) completed on January 22 of 2020.  -he has been on Zytiga  1000 mg daily with prednisone  at 5 mg daily started in 2018, Eligard  22.5 mg every 3 months and Prolia  60 mg every 6 months.  -Tolerating treatment well. His PSA has been slowly trending up with latest 3.0 in April 2025, with doubling time more than 6 months.  Will continue current therapy.

## 2024-02-01 NOTE — Progress Notes (Signed)
 [facility]   [phone] [phone]   Clinic Follow up Note   Patient Care Team: Joshua Debby CROME, MD as PCP - General (Internal Medicine) [place], [place], [place] as Referring Physician (Optometry) Lanny Callander, MD as Attending Physician (Hematology and Oncology)  Date of Service:  02/01/2024  CHIEF COMPLAINT: f/u of metastatic prostate cancer   CURRENT THERAPY:  abiraterone  and prednisone   Oncology History   Prostate cancer (HCC) -stage IV with node metastasis, castration resistant, Guardant 360 (-) in 2022 -diagnosed in 2014 with lymphadenopathy, Gleason score 7 and PSA of 23.  -He developed castration resistant in 2018.  His PSA was up to 11.3 at that time.  -He is status post T10, T12, kyphoplasty (osteoprosis related) completed on January 22 of 2020.  -he has been on Zytiga  1000 mg daily with prednisone  at 5 mg daily started in 2018, Eligard  22.5 mg every 3 months and Prolia  60 mg every 6 months.  -Tolerating treatment well. His PSA has been slowly trending up with latest 3.0 in April 2025, with doubling time more than 6 months.  Will continue current therapy.  Assessment & Plan Prostate cancer Prostate cancer with well-managed PSA levels. Zytiga  effective for five years. No recurrent bladder cancer as confirmed by recent cystoscopy. Lupron  and Prolia  administered regularly. - Continue Zytiga  with two refills - Administer Lupron  and Prolia  injections today - Schedule follow-up every three months for injections  Adrenal insufficiency Adrenal insufficiency managed with prednisone . Endocrinologist advised doubling prednisone  dose during infections or acute illness. - Prescribe prednisone  with instructions to increase to 10 mg twice daily during infections or acute illness - Provide prescription for 100 tablets with three refills  Chronic pain due to compression fractures Chronic pain in the neck and spine, particularly in the high thoracic and lumbar  regions, associated with compression fractures. Pain level is stable but slightly worsening over time. - Refill oxycodone  prescription - Encourage use of oxycodone  only as needed for pain control  Pneumonia Recent episode of pneumonia, likely related to aspiration due to incomplete closure of the epiglottis at night. Pulmonologist appointment scheduled for further evaluation, including endoscopy to assess for aspiration and polyps. - Ensure pulmonologist appointment is attended for further evaluation of aspiration risk  Plan - He is clinically stable, lab reviewed, PSA pending, his last PSA has been overall stable. - Will continue current therapy, I refilled his abiraterone  and prednisone , okay to increase prednisone  from 5 mg to 10 mg twice daily when he has infection or other acute illness - Will proceed Elgart and Prolia  injection today - Lab, follow-up and Eligard  injection in 3 months   SUMMARY OF ONCOLOGIC HISTORY: Oncology History Overview Note   Cancer Staging  Secondary malignant neoplasm of bone and bone marrow (HCC) Staging form: Bone - Appendicular Skeleton, Trunk, Skull, and Facial Bones, AJCC 8th Edition - Clinical: Stage Unknown (cTX, cNX, pM1) - Signed by Amadeo Windell SAILOR, MD on 12/10/2020     Secondary malignant neoplasm of bone and bone marrow (HCC)  08/06/2018 Initial Diagnosis   Secondary malignant neoplasm of bone and bone marrow (HCC)   12/10/2020 Cancer Staging   Staging form: Bone - Appendicular Skeleton, Trunk, Skull, and Facial Bones, AJCC 8th Edition - Clinical: Stage Unknown (cTX, cNX, pM1) - Signed by Amadeo Windell SAILOR, MD on 12/10/2020   Prostate cancer (HCC)  09/16/2021 Initial Diagnosis   Prostate cancer [facility])      Discussed the use of AI scribe software for clinical  note transcription with the patient, who gave verbal consent to proceed.  History of Present Illness [name] Holder is a 68 year old male with prostate cancer who presents for  follow-up.  PSA levels have been fluctuating, with the most recent level in April being 3, slightly lower than the 3.4 recorded in January. He is on Zytiga  for approximately five years and receives Lupron  injections every three months and Prolia  every six months. No new bone pain is present.  Pain is primarily in the neck and spine, particularly in the high lumbar and low thoracic regions with known compression fractures. Oxycodone  is taken four to five times a day for pain management, which has been increasing in severity over time.  Urinary symptoms are managed with Flomax  and solifenacin, reducing nighttime urination frequency effectively. No new dental problems or swollen legs. Morning stiffness and pain improve with medication adherence. Cough is better and less productive.     All other systems were reviewed with the patient and are negative.  MEDICAL HISTORY:  Past Medical History:  Diagnosis Date   Allergic rhinitis    At risk for sleep apnea    STOP-BANG SCORE = 5    (routed to pt's pcp 09-08-2018)   Bladder cancer (HCC) dx'd 2020   Chronic constipation    Chronic low back pain    Chronic pain    DOE (dyspnea on exertion)    History of kidney stones    History of recent pneumonia 08/10/2018   acute bronchopneumonia;  follow-up cxr 08-28-2018 in epic   History of vertebral compression fracture    T10 and T12 s/p kyphoplasty   Hx of colonic polyps    Hyperplasia of prostate with lower urinary tract symptoms (LUTS)    Hypertension    Mild persistent asthma    followed by pcp   Numbness in both legs    secondary to a fall, per pt has had work-up and couldn't find anything   Osteopenia    Prostate cancer metastatic to pelvis [facility]) urologist-- dr borden/  oncologist -- dr amadeo   dx 2014 advanced prostate cancer with pelvid adenopathy,  Stage T2c, Gleason 7;  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   Wears glasses     SURGICAL HISTORY: Past Surgical History:  Procedure Laterality  Date   COLONOSCOPY     IR KYPHO EA ADDL LEVEL THORACIC OR LUMBAR  08/17/2018   IR KYPHO EA ADDL LEVEL THORACIC OR LUMBAR  08/26/2018   IR KYPHO LUMBAR INC FX REDUCE BONE BX UNI/BIL CANNULATION INC/IMAGING  08/26/2018   IR KYPHO THORACIC WITH BONE BIOPSY  08/17/2018   IR RADIOLOGIST EVAL & MGMT  08/11/2018   IR RADIOLOGIST EVAL & MGMT  08/24/2018   LYMPHADENECTOMY Left 01/05/2013   Procedure: ROBOTIC LYMPHADENECTOMY;  Surgeon: Noretta Ferrara, MD;  Location: [facility];  Service: Urology;  Laterality: Left;   PROSTATE BIOPSY     11/12 positive biopsies   ROTATOR CUFF REPAIR Left 2012   TRANSURETHRAL RESECTION OF BLADDER TUMOR N/A 09/15/2018   Procedure: TRANSURETHRAL RESECTION OF BLADDER TUMOR (TURBT)WITH CYSTOSCOPY/ POST OPERATIVE INSTILLATION OF GEMCITABINE ;  Surgeon: Ferrara Glance, MD;  Location: [facility];  Service: Urology;  Laterality: N/A;  GENERAL ANESTHESIA WITH PARALYSIS   UMBILICAL HERNIA REPAIR  09-02-2001   dr t. price @[facility]     I have reviewed the social history and family history with the patient and they are unchanged from previous note.  ALLERGIES:  is allergic to amlodipine  and tetracycline.  MEDICATIONS:  Current Outpatient Medications  Medication Sig Dispense Refill   abiraterone  acetate (ZYTIGA ) 250 MG tablet Take 4 tablets (1,000 mg total) by mouth daily. 120 tablet 2   acetaminophen  (TYLENOL ) 325 MG tablet Take 325 mg by mouth daily as needed for fever.     albuterol  (PROVENTIL ) (2.5 MG/3ML) 0.083% nebulizer solution Take 3 mLs (2.5 mg total) by nebulization every 6 (six) hours as needed for wheezing or shortness of breath. 360 mL 2   albuterol  (VENTOLIN  HFA) 108 (90 Base) MCG/ACT inhaler Inhale 2 puffs into the lungs every 6 (six) hours as needed for wheezing or shortness of breath. 18 g 3   azelastine  (ASTELIN ) 0.1 % nasal spray Place 1 spray into both nostrils 2 (two) times daily. Use in each nostril as directed 90 mL 3   CALCIUM -VITAMIN D  PO Take 1 tablet by mouth 2 (two) times  daily.      carvedilol  (COREG ) 3.125 MG tablet TAKE ONE TABLET TWICE DAILY WITH FOOD 180 tablet 0   denosumab  (PROLIA ) 60 MG/ML SOSY injection Inject 60 mg into the skin every 6 (six) months.     docusate sodium  (COLACE) 50 MG capsule Take 50 mg by mouth daily.     DULoxetine  (CYMBALTA ) 20 MG capsule TAKE ONE CAPSULE DAILY 30 capsule 1   Famotidine  (PEPCID  AC PO) Take 1 tablet by mouth at bedtime.     fluticasone  (FLONASE ) 50 MCG/ACT nasal spray Place 2 sprays into both nostrils daily. 48 g 1   Fluticasone -Umeclidin-Vilant (TRELEGY ELLIPTA ) 100-62.5-25 MCG/ACT AEPB Inhale 1 puff into the lungs daily. 120 each 3   furosemide  (LASIX ) 20 MG tablet TAKE ONE TABLET TWICE DAILY 180 tablet 0   leuprolide  (LUPRON ) 11.25 MG injection Inject 11.25 mg into the muscle every 3 (three) months.     LINZESS  145 MCG CAPS capsule TAKE ONE CAPSULE DAILY BEFORE BREAKFAST 30 capsule 2   meclizine  (ANTIVERT ) 12.5 MG tablet Take 1 tablet (12.5 mg total) by mouth 3 (three) times daily as needed for dizziness. 270 tablet 0   Multiple Vitamins-Minerals (CENTRUM SILVER 50+MEN PO) Take 1 tablet by mouth daily.     NARCAN  4 MG/0.1ML LIQD nasal spray kit Place 0.1 sprays (0.4 mg total) into the nose once as needed (overdose). 1 each 0   olmesartan  (BENICAR ) 20 MG tablet Take 1 tablet (20 mg total) by mouth 2 (two) times daily. 180 tablet 1   oxyCODONE  (OXY IR/ROXICODONE ) 5 MG immediate release tablet Take 1 tablet (5 mg) by mouth every 4 hours as needed for severe pain. 180 tablet 0   polyethylene glycol powder (GLYCOLAX /MIRALAX ) 17 GM/SCOOP powder Take 17 g (1 capful) by mouth daily as directed. 510 g 6   predniSONE  (DELTASONE ) 5 MG tablet Take 1 tablet (5 mg total) by mouth 2 (two) times daily. Ok to increase to 10mg  bid when he has infection or other acute illness 100 tablet 3   rosuvastatin  (CRESTOR ) 10 MG tablet TAKE ONE TABLET BY MOUTH DAILY 90 tablet 0   solifenacin (VESICARE) 5 MG tablet Take 5 mg by mouth daily.      triamcinolone  cream (KENALOG ) 0.1 % Apply 1 Application topically 2 (two) times daily. 15 g 0   No current facility-administered medications for this visit.    PHYSICAL EXAMINATION: ECOG PERFORMANCE STATUS: 2 - Symptomatic, <50% confined to bed  Vitals:   02/01/24 1306  BP: 111/75  Pulse: 84  Resp: 16  Temp: (!) 97.3 F (36.3 C)  SpO2: 100%   Wt  Readings from Last 3 Encounters:  02/01/24 211 lb (95.7 kg)  12/06/23 210 lb 9.6 oz (95.5 kg)  11/09/23 209 lb 11.2 oz (95.1 kg)     GENERAL:alert, no distress and comfortable SKIN: skin color, texture, turgor are normal, no rashes or significant lesions EYES: normal, Conjunctiva are pink and non-injected, sclera clear NECK: supple, thyroid  normal size, non-tender, without nodularity LYMPH:  no palpable lymphadenopathy in the cervical, axillary  LUNGS: clear to auscultation and percussion with normal breathing effort HEART: regular rate & rhythm and no murmurs and no lower extremity edema ABDOMEN:abdomen soft, non-tender and normal bowel sounds Musculoskeletal:no cyanosis of digits and no clubbing  NEURO: alert & oriented x 3 with fluent speech, no focal motor/sensory deficits  Physical Exam   LABORATORY DATA:  I have reviewed the data as listed    Latest Ref Rng & Units 02/01/2024   12:30 PM 11/09/2023   10:02 AM 10/13/2023   11:51 AM  CBC  WBC 4.0 - 10.5 K/uL 13.1  10.9  10.0   Hemoglobin 13.0 - 17.0 g/dL 87.1  86.7  86.7   Hematocrit 39.0 - 52.0 % 37.2  38.7  38.9   Platelets 150 - 400 K/uL 322  280  370.0         Latest Ref Rng & Units 02/01/2024   12:30 PM 11/09/2023   10:02 AM 10/13/2023   11:51 AM  CMP  Glucose 70 - 99 mg/dL 884  885  868   BUN 8 - 23 mg/dL 17  15  19    Creatinine 0.61 - 1.24 mg/dL 9.12  9.09  9.03   Sodium 135 - 145 mmol/L 135  134  134   Potassium 3.5 - 5.1 mmol/L 3.8  3.8  5.2   Chloride 98 - 111 mmol/L 102  100  100   CO2 22 - 32 mmol/L 25  29  28    Calcium  8.9 - 10.3 mg/dL 9.3  9.7  89.6    Total Protein 6.5 - 8.1 g/dL 6.3  6.9  6.9   Total Bilirubin 0.0 - 1.2 mg/dL 0.3  0.4  0.5   Alkaline Phos 38 - 126 U/L 42  39  44   AST 15 - 41 U/L 13  14  19    ALT 0 - 44 U/L 22  18  19        RADIOGRAPHIC STUDIES: I have personally reviewed the radiological images as listed and agreed with the findings in the report. No results found.    Orders Placed This Encounter  Procedures   Prostate-Specific AG, Serum    Standing Status:   Standing    Number of Occurrences:   20    Expiration Date:   01/31/2025   All questions were answered. The patient knows to call the clinic with any problems, questions or concerns. No barriers to learning was detected. The total time spent in the appointment was 30 minutes, including review of chart and various tests results, discussions about plan of care and coordination of care plan     Onita Mattock, MD 02/01/2024

## 2024-02-02 LAB — PROSTATE-SPECIFIC AG, SERUM (LABCORP): Prostate Specific Ag, Serum: 5.7 ng/mL — ABNORMAL HIGH (ref 0.0–4.0)

## 2024-02-04 DIAGNOSIS — K5903 Drug induced constipation: Secondary | ICD-10-CM | POA: Diagnosis not present

## 2024-02-04 DIAGNOSIS — J189 Pneumonia, unspecified organism: Secondary | ICD-10-CM | POA: Diagnosis not present

## 2024-02-04 DIAGNOSIS — J4489 Other specified chronic obstructive pulmonary disease: Secondary | ICD-10-CM | POA: Diagnosis not present

## 2024-02-04 DIAGNOSIS — E669 Obesity, unspecified: Secondary | ICD-10-CM | POA: Diagnosis not present

## 2024-02-15 ENCOUNTER — Other Ambulatory Visit (HOSPITAL_COMMUNITY): Payer: Self-pay

## 2024-02-15 ENCOUNTER — Other Ambulatory Visit: Payer: Self-pay | Admitting: Internal Medicine

## 2024-02-15 ENCOUNTER — Other Ambulatory Visit: Payer: Self-pay | Admitting: Nurse Practitioner

## 2024-02-15 ENCOUNTER — Other Ambulatory Visit: Payer: Self-pay

## 2024-02-15 ENCOUNTER — Telehealth: Payer: Self-pay

## 2024-02-15 DIAGNOSIS — C7951 Secondary malignant neoplasm of bone: Secondary | ICD-10-CM

## 2024-02-15 DIAGNOSIS — J4521 Mild intermittent asthma with (acute) exacerbation: Secondary | ICD-10-CM

## 2024-02-15 MED ORDER — OXYCODONE HCL 5 MG PO TABS
5.0000 mg | ORAL_TABLET | ORAL | 0 refills | Status: DC | PRN
  Filled 2024-02-15: qty 180, 30d supply, fill #0

## 2024-02-15 NOTE — Telephone Encounter (Signed)
 Pt called requesting a refill on his pain medication Oxycodone  5mg  IR tabs.  Pt requesting if refill could be sent to [facility] d/t pt's preferred pharmacy [facility] is out of stock on the pain medication.  Stated this nurse will make Dr. Lanny aware of his request.

## 2024-02-18 ENCOUNTER — Ambulatory Visit: Payer: Self-pay | Admitting: Hematology

## 2024-02-21 ENCOUNTER — Telehealth: Payer: Self-pay

## 2024-02-21 NOTE — Telephone Encounter (Signed)
 Spoke with pt via telephone to in regards of his recent PSA lab drawn on 02/01/2024.  Stated that Dr. Lanny said the PSA is slightly increasing and she would like the pt to have labs redrawn again in 6 wks.  Stated Dr. Demetra scheduler will be contacting the pt to get him scheduled for lab and f/u OV w/additional lab.  Pt verbalized understanding.  Sent Dr. Demetra scheduler a message in regards to scheduling pt for lab and OV.

## 2024-02-25 ENCOUNTER — Other Ambulatory Visit: Payer: Self-pay | Admitting: Nurse Practitioner

## 2024-02-28 ENCOUNTER — Encounter: Payer: Self-pay | Admitting: Hematology

## 2024-03-02 ENCOUNTER — Other Ambulatory Visit: Payer: Self-pay

## 2024-03-02 ENCOUNTER — Telehealth: Payer: Self-pay | Admitting: Hematology

## 2024-03-02 NOTE — Telephone Encounter (Signed)
 Scheduled appointment per incoming call from the patient. Talked with the patient and he is aware of the made appointment.

## 2024-03-06 DIAGNOSIS — J189 Pneumonia, unspecified organism: Secondary | ICD-10-CM | POA: Diagnosis not present

## 2024-03-06 DIAGNOSIS — K5903 Drug induced constipation: Secondary | ICD-10-CM | POA: Diagnosis not present

## 2024-03-06 DIAGNOSIS — J4489 Other specified chronic obstructive pulmonary disease: Secondary | ICD-10-CM | POA: Diagnosis not present

## 2024-03-06 DIAGNOSIS — E669 Obesity, unspecified: Secondary | ICD-10-CM | POA: Diagnosis not present

## 2024-03-07 ENCOUNTER — Ambulatory Visit: Admitting: Internal Medicine

## 2024-03-09 ENCOUNTER — Other Ambulatory Visit: Payer: Self-pay | Admitting: Internal Medicine

## 2024-03-09 DIAGNOSIS — T402X5A Adverse effect of other opioids, initial encounter: Secondary | ICD-10-CM

## 2024-03-21 ENCOUNTER — Other Ambulatory Visit: Payer: Self-pay | Admitting: Primary Care

## 2024-03-21 DIAGNOSIS — J301 Allergic rhinitis due to pollen: Secondary | ICD-10-CM

## 2024-03-28 ENCOUNTER — Inpatient Hospital Stay: Attending: Hematology

## 2024-03-28 DIAGNOSIS — C61 Malignant neoplasm of prostate: Secondary | ICD-10-CM | POA: Insufficient documentation

## 2024-03-30 DIAGNOSIS — K259 Gastric ulcer, unspecified as acute or chronic, without hemorrhage or perforation: Secondary | ICD-10-CM | POA: Diagnosis not present

## 2024-03-30 DIAGNOSIS — K317 Polyp of stomach and duodenum: Secondary | ICD-10-CM | POA: Diagnosis not present

## 2024-03-30 DIAGNOSIS — K2950 Unspecified chronic gastritis without bleeding: Secondary | ICD-10-CM | POA: Diagnosis not present

## 2024-03-30 DIAGNOSIS — K2930 Chronic superficial gastritis without bleeding: Secondary | ICD-10-CM | POA: Diagnosis not present

## 2024-04-06 DIAGNOSIS — E669 Obesity, unspecified: Secondary | ICD-10-CM | POA: Diagnosis not present

## 2024-04-06 DIAGNOSIS — J4489 Other specified chronic obstructive pulmonary disease: Secondary | ICD-10-CM | POA: Diagnosis not present

## 2024-04-06 DIAGNOSIS — J189 Pneumonia, unspecified organism: Secondary | ICD-10-CM | POA: Diagnosis not present

## 2024-04-06 DIAGNOSIS — K5903 Drug induced constipation: Secondary | ICD-10-CM | POA: Diagnosis not present

## 2024-04-07 DIAGNOSIS — K2930 Chronic superficial gastritis without bleeding: Secondary | ICD-10-CM | POA: Diagnosis not present

## 2024-04-07 DIAGNOSIS — K317 Polyp of stomach and duodenum: Secondary | ICD-10-CM | POA: Diagnosis not present

## 2024-04-10 ENCOUNTER — Other Ambulatory Visit: Payer: Self-pay

## 2024-04-10 ENCOUNTER — Other Ambulatory Visit (HOSPITAL_COMMUNITY): Payer: Self-pay

## 2024-04-10 ENCOUNTER — Telehealth: Payer: Self-pay

## 2024-04-10 ENCOUNTER — Other Ambulatory Visit: Payer: Self-pay | Admitting: Nurse Practitioner

## 2024-04-10 DIAGNOSIS — C7951 Secondary malignant neoplasm of bone: Secondary | ICD-10-CM

## 2024-04-10 MED ORDER — OXYCODONE HCL 5 MG PO TABS: 5.0000 mg | ORAL_TABLET | ORAL | 0 refills | Status: DC | PRN

## 2024-04-10 MED ORDER — OXYCODONE HCL 5 MG PO TABS
5.0000 mg | ORAL_TABLET | ORAL | 0 refills | Status: DC | PRN
  Filled 2024-04-10: qty 180, 30d supply, fill #0

## 2024-04-10 NOTE — Telephone Encounter (Signed)
 Pt called requesting a refill on his Oxycodone  5mg  IR tabs.  Pt stated he's coming to [facility] today for another appt and would like to pick his prescription up while he's [place].  Pt requested if the refill could be sent to [facility] pharmacy which is where he normally has is Oxycodone  refilled.  Stated this nurse will may Dr. Demetra APPs Deniece Lessen, NP & Mayme Silversmith, NP) aware of his request.  Pt had no further questions or concerns.

## 2024-04-11 ENCOUNTER — Inpatient Hospital Stay

## 2024-04-11 DIAGNOSIS — C61 Malignant neoplasm of prostate: Secondary | ICD-10-CM | POA: Diagnosis not present

## 2024-04-11 DIAGNOSIS — C7951 Secondary malignant neoplasm of bone: Secondary | ICD-10-CM

## 2024-04-11 LAB — CBC WITH DIFFERENTIAL (CANCER CENTER ONLY)
Abs Immature Granulocytes: 0.14 K/uL — ABNORMAL HIGH (ref 0.00–0.07)
Basophils Absolute: 0.1 K/uL (ref 0.0–0.1)
Basophils Relative: 1 %
Eosinophils Absolute: 0.1 K/uL (ref 0.0–0.5)
Eosinophils Relative: 1 %
HCT: 37.1 % — ABNORMAL LOW (ref 39.0–52.0)
Hemoglobin: 12.9 g/dL — ABNORMAL LOW (ref 13.0–17.0)
Immature Granulocytes: 1 %
Lymphocytes Relative: 22 %
Lymphs Abs: 2.4 K/uL (ref 0.7–4.0)
MCH: 29.8 pg (ref 26.0–34.0)
MCHC: 34.8 g/dL (ref 30.0–36.0)
MCV: 85.7 fL (ref 80.0–100.0)
Monocytes Absolute: 0.9 K/uL (ref 0.1–1.0)
Monocytes Relative: 8 %
Neutro Abs: 7.3 K/uL (ref 1.7–7.7)
Neutrophils Relative %: 67 %
Platelet Count: 283 K/uL (ref 150–400)
RBC: 4.33 MIL/uL (ref 4.22–5.81)
RDW: 13.3 % (ref 11.5–15.5)
WBC Count: 10.9 K/uL — ABNORMAL HIGH (ref 4.0–10.5)
nRBC: 0 % (ref 0.0–0.2)

## 2024-04-11 LAB — CMP (CANCER CENTER ONLY)
ALT: 17 U/L (ref 0–44)
AST: 12 U/L — ABNORMAL LOW (ref 15–41)
Albumin: 4.1 g/dL (ref 3.5–5.0)
Alkaline Phosphatase: 39 U/L (ref 38–126)
Anion gap: 7 (ref 5–15)
BUN: 20 mg/dL (ref 8–23)
CO2: 25 mmol/L (ref 22–32)
Calcium: 10 mg/dL (ref 8.9–10.3)
Chloride: 103 mmol/L (ref 98–111)
Creatinine: 0.82 mg/dL (ref 0.61–1.24)
GFR, Estimated: >60 mL/min (ref >60)
Glucose, Bld: 132 mg/dL — ABNORMAL HIGH (ref 70–99)
Potassium: 3.7 mmol/L (ref 3.5–5.1)
Sodium: 135 mmol/L (ref 135–145)
Total Bilirubin: 0.3 mg/dL (ref 0.0–1.2)
Total Protein: 6.6 g/dL (ref 6.5–8.1)

## 2024-04-12 LAB — PROSTATE-SPECIFIC AG, SERUM (LABCORP): Prostate Specific Ag, Serum: 4.9 ng/mL — ABNORMAL HIGH (ref 0.0–4.0)

## 2024-04-20 ENCOUNTER — Other Ambulatory Visit: Payer: Self-pay | Admitting: Nurse Practitioner

## 2024-04-20 ENCOUNTER — Other Ambulatory Visit: Payer: Self-pay | Admitting: Internal Medicine

## 2024-04-20 DIAGNOSIS — I10 Essential (primary) hypertension: Secondary | ICD-10-CM

## 2024-04-21 ENCOUNTER — Encounter: Payer: Self-pay | Admitting: Hematology

## 2024-04-21 ENCOUNTER — Other Ambulatory Visit: Payer: Self-pay | Admitting: Primary Care

## 2024-04-21 DIAGNOSIS — J4520 Mild intermittent asthma, uncomplicated: Secondary | ICD-10-CM

## 2024-04-21 NOTE — Telephone Encounter (Signed)
 Copied from CRM #[id number]. Topic: Clinical - Medication Refill >> Apr 21, 2024 10:28 AM Rozanna MATSU wrote: Medication: Fluticasone -Umeclidin-Vilant (TRELEGY ELLIPTA ) 100-62.5-25 MCG/ACT AEPB   Has the patient contacted their pharmacy? Yes (Agent: If no, request that the patient contact the pharmacy for the refill. If patient does not wish to contact the pharmacy document the reason why and proceed with request.) (Agent: If yes, when and what did the pharmacy advise?)  This is the patient's preferred pharmacy:   [facility]  [address] [place] [address]  [phone] [facility]  [id number] FX  Is this the correct pharmacy for this prescription? Yes If no, delete pharmacy and type the correct one.   Has the prescription been filled recently? Yes  Is the patient out of the medication? No  Has the patient been seen for an appointment in the last year OR does the patient have an upcoming appointment? Yes  Can we respond through MyChart? No  Agent: Please be advised that Rx refills may take up to 3 business days. We ask that you follow-up with your pharmacy.

## 2024-04-21 NOTE — Telephone Encounter (Signed)
 Copied from CRM #[id number]. Topic: Clinical - Medication Refill >> Apr 21, 2024 10:21 AM Rozanna MATSU wrote: Medication: Fluticasone -Umeclidin-Vilant (TRELEGY ELLIPTA ) 100-62.5-25 MCG/ACT AEPB  Has the patient contacted their pharmacy? Yes (Agent: If no, request that the patient contact the pharmacy for the refill. If patient does not wish to contact the pharmacy document the reason why and proceed with request.) (Agent: If yes, when and what did the pharmacy advise?)  This is the patient's preferred pharmacy:  [facility]  [address] [place] [address] [phone] [facility] [phone] FX   Is this the correct pharmacy for this prescription? Yes If no, delete pharmacy and type the correct one.   Has the prescription been filled recently? Yes  Is the patient out of the medication? No  Has the patient been seen for an appointment in the last year OR does the patient have an upcoming appointment? Yes  Can we respond through MyChart? No  Agent: Please be advised that Rx refills may take up to 3 business days. We ask that you follow-up with your pharmacy.

## 2024-04-21 NOTE — Telephone Encounter (Signed)
 FYI Only or Action Required?: Action required by provider: medication refill request.  Patient is followed in Pulmonology for Asthma, last seen on 09/21/2023 by Hope Almarie ORN, NP.

## 2024-04-26 MED ORDER — TRELEGY ELLIPTA 100-62.5-25 MCG/ACT IN AEPB: 1.0000 | INHALATION_SPRAY | Freq: Every day | RESPIRATORY_TRACT | 3 refills | Status: AC

## 2024-04-27 ENCOUNTER — Telehealth: Payer: Self-pay | Admitting: Internal Medicine

## 2024-04-27 NOTE — Telephone Encounter (Signed)
 Contacted [name] Holder to schedule their annual wellness visit. Patient declined to schedule AWV at this time.Per appt notes on 07/26/2024 patient has a new PCP.  [facility] Care Guide [facility] AWV TEAM Direct Dial: [phone]

## 2024-05-02 NOTE — Assessment & Plan Note (Signed)
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  -He developed castration resistant in 2018.  His PSA was up to 11.3 at that time.  -He is status post T10, T12, kyphoplasty (osteoprosis related) completed on January 22 of 2020.  -he has been on Zytiga  1000 mg daily with prednisone  at 5 mg daily started in 2018, Eligard  22.5 mg every 3 months and Prolia  60 mg every 6 months.  -Tolerating treatment well. His PSA has been slowly trending up with latest 3.0 in April 2025, with doubling time more than 6 months.  Will continue current therapy.

## 2024-05-03 ENCOUNTER — Other Ambulatory Visit (HOSPITAL_COMMUNITY): Payer: Self-pay

## 2024-05-03 ENCOUNTER — Inpatient Hospital Stay: Attending: Hematology

## 2024-05-03 ENCOUNTER — Inpatient Hospital Stay: Admitting: Hematology

## 2024-05-03 ENCOUNTER — Inpatient Hospital Stay

## 2024-05-03 VITALS — BP 142/82 | HR 77 | Temp 98.2°F | Resp 17 | Ht 68.0 in | Wt 220.1 lb

## 2024-05-03 DIAGNOSIS — Z79899 Other long term (current) drug therapy: Secondary | ICD-10-CM | POA: Diagnosis not present

## 2024-05-03 DIAGNOSIS — Z5111 Encounter for antineoplastic chemotherapy: Secondary | ICD-10-CM | POA: Insufficient documentation

## 2024-05-03 DIAGNOSIS — C7951 Secondary malignant neoplasm of bone: Secondary | ICD-10-CM

## 2024-05-03 DIAGNOSIS — Z8546 Personal history of malignant neoplasm of prostate: Secondary | ICD-10-CM | POA: Diagnosis not present

## 2024-05-03 DIAGNOSIS — C775 Secondary and unspecified malignant neoplasm of intrapelvic lymph nodes: Secondary | ICD-10-CM | POA: Diagnosis not present

## 2024-05-03 DIAGNOSIS — C679 Malignant neoplasm of bladder, unspecified: Secondary | ICD-10-CM | POA: Diagnosis not present

## 2024-05-03 DIAGNOSIS — C61 Malignant neoplasm of prostate: Secondary | ICD-10-CM

## 2024-05-03 DIAGNOSIS — Z8042 Family history of malignant neoplasm of prostate: Secondary | ICD-10-CM | POA: Diagnosis not present

## 2024-05-03 DIAGNOSIS — C7952 Secondary malignant neoplasm of bone marrow: Secondary | ICD-10-CM | POA: Diagnosis not present

## 2024-05-03 LAB — CBC WITH DIFFERENTIAL (CANCER CENTER ONLY)
Abs Immature Granulocytes: 0.05 K/uL (ref 0.00–0.07)
Basophils Absolute: 0 K/uL (ref 0.0–0.1)
Basophils Relative: 0 %
Eosinophils Absolute: 0.1 K/uL (ref 0.0–0.5)
Eosinophils Relative: 1 %
HCT: 35.1 % — ABNORMAL LOW (ref 39.0–52.0)
Hemoglobin: 12.5 g/dL — ABNORMAL LOW (ref 13.0–17.0)
Immature Granulocytes: 1 %
Lymphocytes Relative: 18 %
Lymphs Abs: 1.6 K/uL (ref 0.7–4.0)
MCH: 30.2 pg (ref 26.0–34.0)
MCHC: 35.6 g/dL (ref 30.0–36.0)
MCV: 84.8 fL (ref 80.0–100.0)
Monocytes Absolute: 0.7 K/uL (ref 0.1–1.0)
Monocytes Relative: 8 %
Neutro Abs: 6.5 K/uL (ref 1.7–7.7)
Neutrophils Relative %: 72 %
Platelet Count: 252 K/uL (ref 150–400)
RBC: 4.14 MIL/uL — ABNORMAL LOW (ref 4.22–5.81)
RDW: 13.4 % (ref 11.5–15.5)
WBC Count: 9 K/uL (ref 4.0–10.5)
nRBC: 0 % (ref 0.0–0.2)

## 2024-05-03 LAB — CMP (CANCER CENTER ONLY)
ALT: 16 U/L (ref 0–44)
AST: 13 U/L — ABNORMAL LOW (ref 15–41)
Albumin: 4.1 g/dL (ref 3.5–5.0)
Alkaline Phosphatase: 39 U/L (ref 38–126)
Anion gap: 5 (ref 5–15)
BUN: 8 mg/dL (ref 8–23)
CO2: 24 mmol/L (ref 22–32)
Calcium: 9.3 mg/dL (ref 8.9–10.3)
Chloride: 103 mmol/L (ref 98–111)
Creatinine: 0.7 mg/dL (ref 0.61–1.24)
GFR, Estimated: >60 mL/min (ref >60)
Glucose, Bld: 108 mg/dL — ABNORMAL HIGH (ref 70–99)
Potassium: 4.1 mmol/L (ref 3.5–5.1)
Sodium: 132 mmol/L — ABNORMAL LOW (ref 135–145)
Total Bilirubin: 0.4 mg/dL (ref 0.0–1.2)
Total Protein: 6.6 g/dL (ref 6.5–8.1)

## 2024-05-03 MED ORDER — ABIRATERONE ACETATE 250 MG PO TABS: 1000.0000 mg | ORAL_TABLET | Freq: Every day | ORAL | 2 refills | Status: DC

## 2024-05-03 MED ORDER — LEUPROLIDE ACETATE (3 MONTH) 22.5 MG ~~LOC~~ KIT
22.5000 mg | PACK | Freq: Once | SUBCUTANEOUS | Status: AC
  Administered 2024-05-03: 22.5 mg via SUBCUTANEOUS
  Filled 2024-05-03: qty 22.5

## 2024-05-03 MED ORDER — OXYCODONE HCL 5 MG PO TABS
5.0000 mg | ORAL_TABLET | ORAL | 0 refills | Status: DC | PRN
  Filled 2024-05-03: qty 180, 30d supply, fill #0

## 2024-05-03 NOTE — Progress Notes (Signed)
 [facility]   [phone] [phone]   Clinic Follow up Note   Patient Care Team: Joshua Debby CROME, MD as PCP - General (Internal Medicine) [place], [place], [place] as Referring Physician (Optometry) Lanny Callander, MD as Attending Physician (Hematology and Oncology)  Date of Service:  05/03/2024  CHIEF COMPLAINT: f/u of metastatic prostate cancer  CURRENT THERAPY:  Abiraterone  and prednisone  Eligard  22.5 mg injection every 3 months  Oncology History   Prostate cancer (HCC) -stage IV with node metastasis, castration resistant, Guardant 360 (-) in 2022 -diagnosed in 2014 with lymphadenopathy, Gleason score 7 and PSA of 23.  -He developed castration resistant in 2018.  His PSA was up to 11.3 at that time.  -He is status post T10, T12, kyphoplasty (osteoprosis related) completed on January 22 of 2020.  -he has been on Zytiga  1000 mg daily with prednisone  at 5 mg daily started in 2018, Eligard  22.5 mg every 3 months and Prolia  60 mg every 6 months.  -Tolerating treatment well. His PSA has been slowly trending up with latest 3.0 in April 2025, with doubling time more than 6 months.  Will continue current therapy.  Assessment & Plan Metastatic prostate cancer Metastatic prostate cancer with well-managed PSA levels. PSA decreased from 5.7 to 4.9, indicating stable disease. Pain is well-controlled with current medication regimen. No new symptoms or significant changes reported. - Continue Zytiga  (abiraterone ) regimen - Refill Zytiga  prescription through [facility] specialty pharmacy - Monitor PSA levels every three months - Consider PET scan if PSA levels increase significantly or if pain worsens - Write a letter for jury duty exemption due to medical condition  Chronic pain Chronic pain is well-managed with current medication regimen. Takes pain medication every four hours during waking hours, averaging four to five doses per day. Does not typically take  medication during the night unless needed. - Refill pain medication (OxyContin ) at local pharmacy - Schedule next refill for May 13, 2024  Chronic constipation and diarrhea Chronic constipation and diarrhea with alternating symptoms, currently more diarrhea than constipation. Constipation is painful when it occurs. Uses Linzess , Miralax , and Colace for management. - Introduce probiotics to help manage bowel symptoms  Anemia Mild anemia with hemoglobin levels ranging between 11 to 13, with the lowest being 11.6. No significant changes noted.  Gastroesophageal reflux disease (GERD) GERD with nighttime reflux symptoms. Undergoing further evaluation with an EGD and capsule study to assess the small intestine. - Proceed with scheduled capsule study for further evaluation of GERD  Plan - He is clinically stable, lab reviewed, PSA overall stable in the past 3 months - Will continue current treatment - Will proceed to Eligard  injection today and continue every 3 months - Lab and follow-up in 3 months - Will refill his oxycodone  every month - We provided a letter to waive his jury duty today  SUMMARY OF ONCOLOGIC HISTORY: Oncology History Overview Note   Cancer Staging  Secondary malignant neoplasm of bone and bone marrow (HCC) Staging form: Bone - Appendicular Skeleton, Trunk, Skull, and Facial Bones, AJCC 8th Edition - Clinical: Stage Unknown (cTX, cNX, pM1) - Signed by Amadeo Windell SAILOR, MD on 12/10/2020     Secondary malignant neoplasm of bone and bone marrow (HCC)  08/06/2018 Initial Diagnosis   Secondary malignant neoplasm of bone and bone marrow (HCC)   12/10/2020 Cancer Staging   Staging form: Bone - Appendicular Skeleton, Trunk, Skull, and Facial Bones, AJCC 8th Edition - Clinical: Stage Unknown (cTX, cNX, pM1) -  Signed by Amadeo Windell SAILOR, MD on 12/10/2020   Prostate cancer [facility])  09/16/2021 Initial Diagnosis   Prostate cancer [facility])      Discussed the use of AI scribe  software for clinical note transcription with the patient, who gave verbal consent to proceed.  History of Present Illness [name] Holder is a 68 year old male with metastatic prostate cancer who presents for follow-up.  He experiences ongoing bowel irregularities, alternating between constipation and diarrhea, managed with Linzess , Miralax , and Colace. Pain is managed with medication every four hours during waking hours, averaging four to five doses per day. Missing a dose results in increased pain.  PSA levels have shown a slight increase over the past six months. He is on Zytiga  (abiraterone ) for prostate cancer, obtained from a specialty pharmacy.  He has mild anemia with hemoglobin levels ranging between 11 and 13. Sodium levels have normalized, and kidney and liver functions were stable in the last tests.     All other systems were reviewed with the patient and are negative.  MEDICAL HISTORY:  Past Medical History:  Diagnosis Date   Allergic rhinitis    At risk for sleep apnea    STOP-BANG SCORE = 5    (routed to pt's pcp 09-08-2018)   Bladder cancer (HCC) dx'd 2020   Chronic constipation    Chronic low back pain    Chronic pain    DOE (dyspnea on exertion)    History of kidney stones    History of recent pneumonia 08/10/2018   acute bronchopneumonia;  follow-up cxr 08-28-2018 in epic   History of vertebral compression fracture    T10 and T12 s/p kyphoplasty   Hx of colonic polyps    Hyperplasia of prostate with lower urinary tract symptoms (LUTS)    Hypertension    Mild persistent asthma    followed by pcp   Numbness in both legs    secondary to a fall, per pt has had work-up and couldn't find anything   Osteopenia    Prostate cancer metastatic to pelvis [facility]) urologist-- dr borden/  oncologist -- dr amadeo   dx 2014 advanced prostate cancer with pelvid adenopathy,  Stage T2c, Gleason 7;  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   Wears glasses     SURGICAL HISTORY: Past  Surgical History:  Procedure Laterality Date   COLONOSCOPY     IR KYPHO EA ADDL LEVEL THORACIC OR LUMBAR  08/17/2018   IR KYPHO EA ADDL LEVEL THORACIC OR LUMBAR  08/26/2018   IR KYPHO LUMBAR INC FX REDUCE BONE BX UNI/BIL CANNULATION INC/IMAGING  08/26/2018   IR KYPHO THORACIC WITH BONE BIOPSY  08/17/2018   IR RADIOLOGIST EVAL & MGMT  08/11/2018   IR RADIOLOGIST EVAL & MGMT  08/24/2018   LYMPHADENECTOMY Left 01/05/2013   Procedure: ROBOTIC LYMPHADENECTOMY;  Surgeon: Noretta Ferrara, MD;  Location: [facility];  Service: Urology;  Laterality: Left;   PROSTATE BIOPSY     11/12 positive biopsies   ROTATOR CUFF REPAIR Left 2012   TRANSURETHRAL RESECTION OF BLADDER TUMOR N/A 09/15/2018   Procedure: TRANSURETHRAL RESECTION OF BLADDER TUMOR (TURBT)WITH CYSTOSCOPY/ POST OPERATIVE INSTILLATION OF GEMCITABINE ;  Surgeon: Ferrara Glance, MD;  Location: [facility];  Service: Urology;  Laterality: N/A;  GENERAL ANESTHESIA WITH PARALYSIS   UMBILICAL HERNIA REPAIR  09-02-2001   dr t. price @[facility]     I have reviewed the social history and family history with the patient and they are unchanged from previous note.  ALLERGIES:  is  allergic to amlodipine  and tetracycline.  MEDICATIONS:  Current Outpatient Medications  Medication Sig Dispense Refill   abiraterone  acetate (ZYTIGA ) 250 MG tablet Take 4 tablets (1,000 mg total) by mouth daily. 120 tablet 2   acetaminophen  (TYLENOL ) 325 MG tablet Take 325 mg by mouth daily as needed for fever.     albuterol  (PROVENTIL ) (2.5 MG/3ML) 0.083% nebulizer solution TAKE THREE ML by NEBULIZER EVERY 6 HOURS AS NEEDED FOR WHEEZING OR SHORTNESS OF BREATH 360 mL 2   albuterol  (VENTOLIN  HFA) 108 (90 Base) MCG/ACT inhaler Inhale 2 puffs into the lungs every 6 (six) hours as needed for wheezing or shortness of breath. 18 g 3   azelastine  (ASTELIN ) 0.1 % nasal spray Place 1 spray into both nostrils 2 (two) times daily. Use in each nostril as directed 90 mL 3   CALCIUM -VITAMIN D  PO Take 1 tablet by mouth  2 (two) times daily.      carvedilol  (COREG ) 3.125 MG tablet TAKE ONE TABLET TWICE DAILY WITH FOOD 180 tablet 0   denosumab  (PROLIA ) 60 MG/ML SOSY injection Inject 60 mg into the skin every 6 (six) months.     docusate sodium  (COLACE) 50 MG capsule Take 50 mg by mouth daily.     DULoxetine  (CYMBALTA ) 20 MG capsule TAKE ONE CAPSULE DAILY 30 capsule 1   Famotidine  (PEPCID  AC PO) Take 1 tablet by mouth at bedtime.     fluticasone  (FLONASE ) 50 MCG/ACT nasal spray 2 SPRAYS INTO EACH NOSTRIL DAILY 48 g 1   Fluticasone -Umeclidin-Vilant (TRELEGY ELLIPTA ) 100-62.5-25 MCG/ACT AEPB Inhale 1 puff into the lungs daily. 120 each 3   furosemide  (LASIX ) 20 MG tablet TAKE ONE TABLET TWICE DAILY 180 tablet 0   leuprolide  (LUPRON ) 11.25 MG injection Inject 11.25 mg into the muscle every 3 (three) months.     LINZESS  145 MCG CAPS capsule TAKE 1 CAPSULE BY MOUTH DAILY BEFORE BREAKFAST 30 capsule 2   meclizine  (ANTIVERT ) 12.5 MG tablet Take 1 tablet (12.5 mg total) by mouth 3 (three) times daily as needed for dizziness. 270 tablet 0   Multiple Vitamins-Minerals (CENTRUM SILVER 50+MEN PO) Take 1 tablet by mouth daily.     NARCAN  4 MG/0.1ML LIQD nasal spray kit Place 0.1 sprays (0.4 mg total) into the nose once as needed (overdose). 1 each 0   olmesartan  (BENICAR ) 20 MG tablet Take 1 tablet (20 mg total) by mouth 2 (two) times daily. 180 tablet 1   oxyCODONE  (OXY IR/ROXICODONE ) 5 MG immediate release tablet Take 1 tablet (5 mg) by mouth every 4 hours as needed for severe pain. 180 tablet 0   polyethylene glycol powder (GLYCOLAX /MIRALAX ) 17 GM/SCOOP powder Take 17 g (1 capful) by mouth daily as directed. 510 g 6   predniSONE  (DELTASONE ) 5 MG tablet Take 1 tablet (5 mg total) by mouth 2 (two) times daily. Ok to increase to 10mg  bid when he has infection or other acute illness 100 tablet 3   rosuvastatin  (CRESTOR ) 10 MG tablet TAKE ONE TABLET BY MOUTH DAILY 90 tablet 0   solifenacin (VESICARE) 5 MG tablet Take 5 mg by mouth  daily.     triamcinolone  cream (KENALOG ) 0.1 % Apply 1 Application topically 2 (two) times daily. 15 g 0   No current facility-administered medications for this visit.    PHYSICAL EXAMINATION: ECOG PERFORMANCE STATUS: 2 - Symptomatic, <50% confined to bed  Vitals:   05/03/24 1256 05/03/24 1300  BP: (!) 154/96 (!) 142/82  Pulse: 77   Resp: 17  Temp: 98.2 F (36.8 C)   SpO2: 98%    Wt Readings from Last 3 Encounters:  05/03/24 220 lb 1.6 oz (99.8 kg)  02/01/24 211 lb (95.7 kg)  12/06/23 210 lb 9.6 oz (95.5 kg)     GENERAL:alert, no distress and comfortable SKIN: skin color, texture, turgor are normal, no rashes or significant lesions EYES: normal, Conjunctiva are pink and non-injected, sclera clear NECK: supple, thyroid  normal size, non-tender, without nodularity LYMPH:  no palpable lymphadenopathy in the cervical, axillary  LUNGS: clear to auscultation and percussion with normal breathing effort HEART: regular rate & rhythm and no murmurs and no lower extremity edema ABDOMEN:abdomen soft, non-tender and normal bowel sounds Musculoskeletal:no cyanosis of digits and no clubbing  NEURO: alert & oriented x 3 with fluent speech, no focal motor/sensory deficits  Physical Exam CHEST: Lungs clear to auscultation bilaterally. ABDOMEN: Abdomen non-tender.  LABORATORY DATA:  I have reviewed the data as listed    Latest Ref Rng & Units 05/03/2024   12:38 PM 04/11/2024   11:37 AM 02/01/2024   12:30 PM  CBC  WBC 4.0 - 10.5 K/uL 9.0  10.9  13.1   Hemoglobin 13.0 - 17.0 g/dL 87.4  87.0  87.1   Hematocrit 39.0 - 52.0 % 35.1  37.1  37.2   Platelets 150 - 400 K/uL 252  283  322         Latest Ref Rng & Units 05/03/2024   12:38 PM 04/11/2024   11:37 AM 02/01/2024   12:30 PM  CMP  Glucose 70 - 99 mg/dL 891  867  884   BUN 8 - 23 mg/dL 8  20  17    Creatinine 0.61 - 1.24 mg/dL 9.29  9.17  9.12   Sodium 135 - 145 mmol/L 132  135  135   Potassium 3.5 - 5.1 mmol/L 4.1  3.7  3.8    Chloride 98 - 111 mmol/L 103  103  102   CO2 22 - 32 mmol/L 24  25  25    Calcium  8.9 - 10.3 mg/dL 9.3  89.9  9.3   Total Protein 6.5 - 8.1 g/dL 6.6  6.6  6.3   Total Bilirubin 0.0 - 1.2 mg/dL 0.4  0.3  0.3   Alkaline Phos 38 - 126 U/L 39  39  42   AST 15 - 41 U/L 13  12  13    ALT 0 - 44 U/L 16  17  22        RADIOGRAPHIC STUDIES: I have personally reviewed the radiological images as listed and agreed with the findings in the report. No results found.    No orders of the defined types were placed in this encounter.  All questions were answered. The patient knows to call the clinic with any problems, questions or concerns. No barriers to learning was detected. The total time spent in the appointment was 30 minutes, including review of chart and various tests results, discussions about plan of care and coordination of care plan     Onita Mattock, MD 05/03/2024

## 2024-05-04 ENCOUNTER — Other Ambulatory Visit: Payer: Self-pay | Admitting: Internal Medicine

## 2024-05-04 DIAGNOSIS — E785 Hyperlipidemia, unspecified: Secondary | ICD-10-CM

## 2024-05-04 DIAGNOSIS — I10 Essential (primary) hypertension: Secondary | ICD-10-CM

## 2024-05-06 DIAGNOSIS — K5903 Drug induced constipation: Secondary | ICD-10-CM | POA: Diagnosis not present

## 2024-05-06 DIAGNOSIS — J4489 Other specified chronic obstructive pulmonary disease: Secondary | ICD-10-CM | POA: Diagnosis not present

## 2024-05-06 DIAGNOSIS — J189 Pneumonia, unspecified organism: Secondary | ICD-10-CM | POA: Diagnosis not present

## 2024-05-06 DIAGNOSIS — E669 Obesity, unspecified: Secondary | ICD-10-CM | POA: Diagnosis not present

## 2024-05-09 DIAGNOSIS — D131 Benign neoplasm of stomach: Secondary | ICD-10-CM | POA: Diagnosis not present

## 2024-05-16 ENCOUNTER — Inpatient Hospital Stay (HOSPITAL_BASED_OUTPATIENT_CLINIC_OR_DEPARTMENT_OTHER): Admitting: Genetic Counselor

## 2024-05-16 ENCOUNTER — Other Ambulatory Visit: Payer: Self-pay | Admitting: Genetic Counselor

## 2024-05-16 ENCOUNTER — Inpatient Hospital Stay

## 2024-05-16 DIAGNOSIS — C61 Malignant neoplasm of prostate: Secondary | ICD-10-CM | POA: Diagnosis not present

## 2024-05-16 DIAGNOSIS — Z5111 Encounter for antineoplastic chemotherapy: Secondary | ICD-10-CM | POA: Diagnosis not present

## 2024-05-16 DIAGNOSIS — C679 Malignant neoplasm of bladder, unspecified: Secondary | ICD-10-CM | POA: Diagnosis not present

## 2024-05-16 DIAGNOSIS — Z79899 Other long term (current) drug therapy: Secondary | ICD-10-CM | POA: Diagnosis not present

## 2024-05-16 DIAGNOSIS — Z860100 Personal history of colon polyps, unspecified: Secondary | ICD-10-CM | POA: Diagnosis not present

## 2024-05-16 DIAGNOSIS — Z8042 Family history of malignant neoplasm of prostate: Secondary | ICD-10-CM | POA: Diagnosis not present

## 2024-05-16 DIAGNOSIS — C775 Secondary and unspecified malignant neoplasm of intrapelvic lymph nodes: Secondary | ICD-10-CM | POA: Diagnosis not present

## 2024-05-16 DIAGNOSIS — C7951 Secondary malignant neoplasm of bone: Secondary | ICD-10-CM | POA: Diagnosis not present

## 2024-05-16 LAB — GENETIC SCREENING ORDER

## 2024-05-17 ENCOUNTER — Encounter: Payer: Self-pay | Admitting: Genetic Counselor

## 2024-05-17 ENCOUNTER — Encounter: Payer: Self-pay | Admitting: Hematology

## 2024-05-17 DIAGNOSIS — Z8042 Family history of malignant neoplasm of prostate: Secondary | ICD-10-CM | POA: Insufficient documentation

## 2024-05-17 DIAGNOSIS — Z860100 Personal history of colon polyps, unspecified: Secondary | ICD-10-CM | POA: Insufficient documentation

## 2024-05-17 NOTE — Progress Notes (Addendum)
 REFERRING PROVIDER: Elicia Claw, MD [address] [place],  [place] [address]  PRIMARY PROVIDER:  Joshua Debby CROME, MD  PRIMARY REASON FOR VISIT:  1. Family history of prostate cancer   2. Prostate cancer (HCC)   3. Malignant neoplasm of urinary bladder, unspecified site [facility])      HISTORY OF PRESENT ILLNESS:   Mr. Bazen, a 68 y.o. male, was seen for a [place] cancer genetics consultation at the request of Dr. Elicia due to a personal and family history of cancer and personal history of polyposis.  Mr. Waddington presents to clinic today to discuss the possibility of a hereditary predisposition to cancer, genetic testing, and to further clarify his future cancer risks, as well as potential cancer risks for family members.   In 2011, at the age of 85, Mr. Ryans was diagnosed with prostate cancer  the Gleason score was 7.  In 2020 he was diagnosed with urothelial cancer.  On his last colonoscopy the had 9 tubular adenomas, raising his total number of polyps in his lifetime to over 10.      CANCER HISTORY:  Oncology History Overview Note   Cancer Staging  Secondary malignant neoplasm of bone and bone marrow (HCC) Staging form: Bone - Appendicular Skeleton, Trunk, Skull, and Facial Bones, AJCC 8th Edition - Clinical: Stage Unknown (cTX, cNX, pM1) - Signed by Amadeo Windell SAILOR, MD on 12/10/2020     Secondary malignant neoplasm of bone and bone marrow (HCC)  08/06/2018 Initial Diagnosis   Secondary malignant neoplasm of bone and bone marrow (HCC)   12/10/2020 Cancer Staging   Staging form: Bone - Appendicular Skeleton, Trunk, Skull, and Facial Bones, AJCC 8th Edition - Clinical: Stage Unknown (cTX, cNX, pM1) - Signed by Amadeo Windell SAILOR, MD on 12/10/2020   Prostate cancer (HCC)  09/16/2021 Initial Diagnosis   Prostate cancer [facility])      Past Medical History:  Diagnosis Date   Allergic rhinitis    At risk for sleep apnea    STOP-BANG SCORE = 5    (routed to pt's  pcp 09-08-2018)   Bladder cancer (HCC) dx'd 2020   Chronic constipation    Chronic low back pain    Chronic pain    DOE (dyspnea on exertion)    Family history of prostate cancer    History of kidney stones    History of recent pneumonia 08/10/2018   acute bronchopneumonia;  follow-up cxr 08-28-2018 in epic   History of vertebral compression fracture    T10 and T12 s/p kyphoplasty   Hx of colonic polyps    Hyperplasia of prostate with lower urinary tract symptoms (LUTS)    Hypertension    Mild persistent asthma    followed by pcp   Numbness in both legs    secondary to a fall, per pt has had work-up and couldn't find anything   Osteopenia    Prostate cancer metastatic to pelvis [facility]) urologist-- dr borden/  oncologist -- dr amadeo   dx 2014 advanced prostate cancer with pelvid adenopathy,  Stage T2c, Gleason 7;  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   Wears glasses     Past Surgical History:  Procedure Laterality Date   COLONOSCOPY     IR KYPHO EA ADDL LEVEL THORACIC OR LUMBAR  08/17/2018   IR KYPHO EA ADDL LEVEL THORACIC OR LUMBAR  08/26/2018   IR KYPHO LUMBAR INC FX REDUCE BONE BX UNI/BIL CANNULATION INC/IMAGING  08/26/2018   IR KYPHO THORACIC WITH  BONE BIOPSY  08/17/2018   IR RADIOLOGIST EVAL & MGMT  08/11/2018   IR RADIOLOGIST EVAL & MGMT  08/24/2018   LYMPHADENECTOMY Left 01/05/2013   Procedure: ROBOTIC LYMPHADENECTOMY;  Surgeon: Noretta Ferrara, MD;  Location: [facility];  Service: Urology;  Laterality: Left;   PROSTATE BIOPSY     11/12 positive biopsies   ROTATOR CUFF REPAIR Left 2012   TRANSURETHRAL RESECTION OF BLADDER TUMOR N/A 09/15/2018   Procedure: TRANSURETHRAL RESECTION OF BLADDER TUMOR (TURBT)WITH CYSTOSCOPY/ POST OPERATIVE INSTILLATION OF GEMCITABINE ;  Surgeon: Ferrara Glance, MD;  Location: [facility] OR[facility];  Service: Urology;  Laterality: N/A;  GENERAL ANESTHESIA WITH PARALYSIS   UMBILICAL HERNIA REPAIR  09-02-2001   dr t. 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[employer]    Comment: retired  Tobacco Use   Smoking status: Former    Current packs/day: 0.00    Average packs/day: 1 pack/day for 25.0 years (25.0 ttl pk-yrs)    Types: Cigarettes    Start date: 09/09/1987    Quit date: 09/08/2012    Years since quitting: 11.6   Smokeless tobacco: Never  Vaping Use   Vaping status: Former   Start date: 09/08/2012   Quit date: 07/08/2018   Devices: e-cigarette,  e-blu  Substance and Sexual Activity   Alcohol use: Not Currently    Alcohol/week: 2.0 standard drinks of alcohol    Types: 2 Glasses of wine per week   Drug use: Never   Sexual activity: Not on file  Other Topics Concern   Not on file  Social History Narrative   Regular exercise-yesCaffeine Use-yes   Lives with wife   Social Drivers of Health   Financial Resource Strain: Low Risk  (07/26/2023)   Overall Financial Resource Strain (CARDIA)    Difficulty of Paying Living Expenses: Not hard at all  Food Insecurity: No Food Insecurity (07/26/2023)   Hunger Vital Sign    Worried About Running Out of Food in the Last Year: Never true    Ran Out of Food in the Last Year: Never true  Transportation Needs: No Transportation Needs (07/26/2023)   PRAPARE - Administrator, Civil Service (Medical): No    Lack of Transportation (Non-Medical): No  Physical Activity: Sufficiently Active (07/26/2023)   Exercise Vital Sign    Days of Exercise per Week: 7 days    Minutes of Exercise per Session: 30 min  Stress: No Stress Concern Present (07/26/2023)   [employer] of Occupational Health - Occupational Stress Questionnaire    Feeling of Stress : Only a little  Social Connections: Moderately Isolated (07/26/2023)   Social Connection and Isolation Panel    Frequency of  Communication with Friends and Family: More than three times a week    Frequency of Social Gatherings with Friends and Family: Once a week    Attends Religious Services: Never    Database administrator or Organizations: No    Attends Engineer, structural: Never    Marital Status: Married     FAMILY HISTORY:  We obtained a detailed, 4-generation family history.  Significant diagnoses are listed below: Family History  Problem Relation Age of Onset   Arthritis Mother  Diabetes Mother    Hypertension Mother    Hyperlipidemia Mother    Hypertension Father    Prostate cancer Father        radiation in mid 17s   Heart attack Father    Other Sister        precancerous colon polyps, 6 in colon removed   Stroke Maternal Grandfather      The patient does not have children.  He has a sister who has a history of polyps.  Both parents are deceased.  The patient's father was diagnosed with prostate cancer. He has several full and paternal half siblings who were not reported to have cancer.  The patient's mother had three sisters.  There was no reported family history of cancer on the maternal side.  Mr. Carneal is unaware of previous family history of genetic testing for hereditary cancer risks. There is no reported Ashkenazi Jewish ancestry. There is no known consanguinity.  GENETIC COUNSELING ASSESSMENT: Mr. Himebaugh is a 68 y.o. male with a personal and family history of cancer and a personal history of polyposis which is somewhat suggestive of a hereditary cancer syndrome and predisposition to cancer. We, therefore, discussed and recommended the following at today's visit.   DISCUSSION: We discussed that, in general, most cancer is not inherited in families, but instead is sporadic or familial. Sporadic cancers occur by chance and typically happen at older ages (>50 years) as this type of cancer is caused by genetic changes acquired during an individual's lifetime. Some families  have more cancers than would be expected by chance; however, the ages or types of cancer are not consistent with a known genetic mutation or known genetic mutations have been ruled out. This type of familial cancer is thought to be due to a combination of multiple genetic, environmental, hormonal, and lifestyle factors. While this combination of factors likely increases the risk of cancer, the exact source of this risk is not currently identifiable or testable.  We discussed that many people have polyps, but most have fewer than 5 in their lifetime.  When an individual has greater than 10 polyps in their lifetime it is called polyposis and there is an increased risk for a hereditary mutation causing the polyposis.  Most cases of hereditary polyposis is due to APC mutations.  There are other genes associated with hereditary polyposis. This includes MUTYH, BMPR1A and a few others.  We reviewed the characteristics, features and inheritance patterns of hereditary cancer syndromes. We also discussed genetic testing, including the appropriate family members to test, the process of testing, insurance coverage and turn-around-time for results. We discussed the implications of a negative, positive, carrier and/or variant of uncertain significant result. Mr. Ayoub decided to pursue genetic testing for the CancerNext-Expanded+RNA gene panel.   The CancerNext-Expanded gene panel offered by [facility] and includes sequencing, rearrangement, and RNA analysis for the following 77 genes: AIP, ALK, APC, ATM, BAP1, BARD1, BMPR1A, BRCA1, BRCA2, BRIP1, CDC73, CDH1, CDK4, CDKN1B, CDKN2A, CEBPA, CHEK2, CTNNA1, DDX41, DICER1, ETV6, FH, FLCN, GATA2, LZTR1, MAX, MBD4, MEN1, MET, MLH1, MSH2, MSH3, MSH6, MUTYH, NF1, NF2, NTHL1, PALB2, PHOX2B, PMS2, POT1, PRKAR1A, PTCH1, PTEN, RAD51C, RAD51D, RB1, RET, RPS20, RUNX1, SDHA, SDHAF2, SDHB, SDHC, SDHD, SMAD4, SMARCA4, SMARCB1, SMARCE1, STK11, SUFU, TMEM127, TP53, TSC1, TSC2, VHL, and  WT1 (sequencing and deletion/duplication); AXIN2, CTNNA1, DDX41, EGFR, HOXB13, KIT, MBD4, MITF, MSH3, PDGFRA, POLD1 and POLE (sequencing only); EPCAM and GREM1 (deletion/duplication only). RNA data is routinely analyzed for use in variant interpretation for all  genes.   Based on Mr. Bais's personal and family history of cancer and polyposis, he meets medical criteria for genetic testing. Despite that he meets criteria, he may still have an out of pocket cost. We discussed that if his out of pocket cost for testing is over $100, the laboratory will call and confirm whether he wants to proceed with testing.  If the out of pocket cost of testing is less than $100 he will be billed by the genetic testing laboratory.   We discussed that some people do not want to undergo genetic testing due to fear of genetic discrimination.  The Genetic Information Nondiscrimination Act (GINA) was signed into federal law in 2008. GINA prohibits health insurers and most employers from discriminating against individuals based on genetic information (including the results of genetic tests and family history information). According to GINA, health insurance companies cannot consider genetic information to be a preexisting condition, nor can they use it to make decisions regarding coverage or rates. GINA also makes it illegal for most employers to use genetic information in making decisions about hiring, firing, promotion, or terms of employment. It is important to note that GINA does not offer protections for life insurance, disability insurance, or long-term care insurance. GINA does not apply to those in the [employer], those who work for companies with less than 15 employees, and new life insurance or long-term disability insurance policies.  Health status due to a cancer diagnosis is not protected under GINA. More information about GINA can be found by visiting [email or web address].  PLAN: After considering the risks, benefits, and  limitations, Mr. Crom provided informed consent to pursue genetic testing and the blood sample was sent to [facility] for analysis of the CancerNext-Expanded+RNAinsight. Results should be available within approximately 2-3 weeks' time, at which point they will be disclosed by telephone to Mr. Vonzell, as will any additional recommendations warranted by these results. Mr. Knipple will receive a summary of his genetic counseling visit and a copy of his results once available. This information will also be available in Epic.   Lastly, we encouraged Mr. Kalt to remain in contact with cancer genetics annually so that we can continuously update the family history and inform him of any changes in cancer genetics and testing that may be of benefit for this family.   Mr. Villalon questions were answered to his satisfaction today. Our contact information was provided should additional questions or concerns arise. Thank you for the referral and allowing us  to share in the care of your patient.   Vashon Arch P. Perri, MS, CGC Licensed, Patent attorney [email or web address] phone: [phone]  I personally spent a total of 46 minutes in the care of the patient today including preparing to see the patient, getting/reviewing separately obtained history, counseling and educating, and placing orders. .  The patient brought his wife. Drs. Lanny Stalls, and/or Gudena were available for questions, if needed..    _______________________________________________________________________ For Office Staff:  Number of people involved in session: 2 Was an Intern/ student involved with case: no

## 2024-05-19 ENCOUNTER — Other Ambulatory Visit: Payer: Self-pay | Admitting: Gastroenterology

## 2024-05-19 DIAGNOSIS — D131 Benign neoplasm of stomach: Secondary | ICD-10-CM

## 2024-05-23 ENCOUNTER — Other Ambulatory Visit: Payer: Self-pay | Admitting: Internal Medicine

## 2024-05-23 DIAGNOSIS — J4521 Mild intermittent asthma with (acute) exacerbation: Secondary | ICD-10-CM

## 2024-05-24 ENCOUNTER — Encounter: Payer: Self-pay | Admitting: Genetic Counselor

## 2024-05-24 DIAGNOSIS — Z1379 Encounter for other screening for genetic and chromosomal anomalies: Secondary | ICD-10-CM | POA: Insufficient documentation

## 2024-05-26 ENCOUNTER — Ambulatory Visit: Payer: Self-pay | Admitting: Genetic Counselor

## 2024-05-26 ENCOUNTER — Telehealth: Payer: Self-pay | Admitting: Genetic Counselor

## 2024-05-26 DIAGNOSIS — C679 Malignant neoplasm of bladder, unspecified: Secondary | ICD-10-CM

## 2024-05-26 DIAGNOSIS — Z1379 Encounter for other screening for genetic and chromosomal anomalies: Secondary | ICD-10-CM

## 2024-05-26 DIAGNOSIS — C61 Malignant neoplasm of prostate: Secondary | ICD-10-CM

## 2024-05-26 DIAGNOSIS — Z860100 Personal history of colon polyps, unspecified: Secondary | ICD-10-CM

## 2024-05-26 NOTE — Progress Notes (Signed)
 GENETIC TEST RESULTS  Patient Name: [name] Holder Patient Age: 68 y.o. Encounter Date: 05/26/2024  Referring Provider: Glendia Holt, MD  Results were called out to [name] Holder on May 25, 2024.  He was originally seen due to a personal and family history of cancer and concern regarding a hereditary predisposition to cancer in the family. Please refer to the prior Genetics clinic note for more information regarding [name] Holder's medical and family histories and our assessment at the time.   Oncology History Overview Note   Cancer Staging  Secondary malignant neoplasm of bone and bone marrow (HCC) Staging form: Bone - Appendicular Skeleton, Trunk, Skull, and Facial Bones, AJCC 8th Edition - Clinical: Stage Unknown (cTX, cNX, pM1) - Signed by [name] Windell SAILOR, MD on 12/10/2020     Secondary malignant neoplasm of bone and bone marrow (HCC)  08/06/2018 Initial Diagnosis   Secondary malignant neoplasm of bone and bone marrow (HCC)   12/10/2020 Cancer Staging   Staging form: Bone - Appendicular Skeleton, Trunk, Skull, and Facial Bones, AJCC 8th Edition - Clinical: Stage Unknown (cTX, cNX, pM1) - Signed by [name] Windell SAILOR, MD on 12/10/2020   Prostate cancer (HCC)  09/16/2021 Initial Diagnosis   Prostate cancer (HCC)   05/24/2024 Genetic Testing   CHEK2  c.1100delC (p.T367Mfs*15)  VUS in ALK  p.S15Y (c.44C>A)  The CancerNext-Expanded gene panel offered by [facility] and includes sequencing, rearrangement, and RNA analysis for the following 77 genes: AIP, ALK, APC, ATM, BAP1, BARD1, BMPR1A, BRCA1, BRCA2, BRIP1, CDC73, CDH1, CDK4, CDKN1B, CDKN2A, CEBPA, CHEK2, CTNNA1, DDX41, DICER1, ETV6, FH, FLCN, GATA2, LZTR1, MAX, MBD4, MEN1, MET, MLH1, MSH2, MSH3, MSH6, MUTYH, NF1, NF2, NTHL1, PALB2, PHOX2B, PMS2, POT1, PRKAR1A, PTCH1, PTEN, RAD51C, RAD51D, RB1, RET, RPS20, RUNX1, SDHA, SDHAF2, SDHB, SDHC, SDHD, SMAD4, SMARCA4, SMARCB1, SMARCE1, STK11, SUFU, TMEM127, TP53, TSC1, TSC2, VHL, and  WT1 (sequencing and deletion/duplication); AXIN2, CTNNA1, DDX41, EGFR, HOXB13, KIT, MBD4, MITF, MSH3, PDGFRA, POLD1 and POLE (sequencing only); EPCAM and GREM1 (deletion/duplication only). RNA data is routinely analyzed for use in variant interpretation for all genes.     Family History:  We obtained a detailed, 4-generation family history.  Significant diagnoses are listed below: Family History  Problem Relation Age of Onset   Arthritis Mother    Diabetes Mother    Hypertension Mother    Hyperlipidemia Mother    Hypertension Father    Prostate cancer Father        radiation in mid 29s   Heart attack Father    Other Sister        precancerous colon polyps, 6 in colon removed   Stroke Maternal Grandfather        The patient does not have children.  He has a sister who has a history of polyps.  Both parents are deceased.   The patient's father was diagnosed with prostate cancer. He has several full and paternal half siblings who were not reported to have cancer.   The patient's mother had three sisters.  There was no reported family history of cancer on the maternal side.   [name] Holder is unaware of previous family history of genetic testing for hereditary cancer risks. There is no reported Ashkenazi Jewish ancestry. There is no known consanguinity.  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 Specifically, this variant is c.1100delC.  No other pathogenic variants were detected in the CancerNext-Expanded+RNA panel.  The CancerNext-Expanded gene panel offered by [facility]  and includes sequencing, rearrangement, and RNA analysis for the following 77 genes: AIP, ALK, APC, ATM, BAP1, BARD1, BMPR1A, BRCA1, BRCA2, BRIP1, CDC73, CDH1, CDK4, CDKN1B, CDKN2A, CEBPA, CHEK2, CTNNA1, DDX41, DICER1, ETV6, FH, FLCN, GATA2, LZTR1, MAX, MBD4, MEN1, MET, MLH1, MSH2, MSH3, MSH6, MUTYH, NF1, NF2, NTHL1, PALB2, PHOX2B, PMS2, POT1, PRKAR1A, PTCH1,  PTEN, RAD51C, RAD51D, RB1, RET, RPS20, RUNX1, SDHA, SDHAF2, SDHB, SDHC, SDHD, SMAD4, SMARCA4, SMARCB1, SMARCE1, STK11, SUFU, TMEM127, TP53, TSC1, TSC2, VHL, and WT1 (sequencing and deletion/duplication); AXIN2, CTNNA1, DDX41, EGFR, HOXB13, KIT, MBD4, MITF, MSH3, PDGFRA, POLD1 and POLE (sequencing only); EPCAM and GREM1 (deletion/duplication only). RNA data is routinely analyzed for use in variant interpretation for all genes.   The test report has been scanned into EPIC and is located under the Molecular Pathology section of the Results Review tab.  A portion of the result report is included below for reference. Genetic testing reported out on May 23, 2024.     Genetic testing also identified a variant of uncertain significance (VUS) in the ALK gene called p.S15Y (c.44C>A).  At this time, it is unknown if this variant is associated with increased cancer risk or if this is a normal finding, but most variants such as this get reclassified to being inconsequential. It should not be used to make medical management decisions. With time, we suspect the lab will determine the significance of this variant, if any. If we do learn more about it, we will try to contact Mr Holder to discuss it further. However, it is important to stay in touch with us  periodically and keep the address and phone number up to date.   [name] Holder genetic test results do not explain why he has had multiple colon polyps or why he has a personal history of bladder cancer, but could have played a role with his diagnosis of prostate cancer. We discussed with [name] Holder that because current genetic testing is not perfect, it is possible there may be a gene mutation in one of these genes that current testing cannot detect, but that chance is small. We also discussed that there could be another gene that has not yet been discovered, or that we have not yet Holder, that is responsible for the cancer diagnoses in the family.   Cancer Risks  for CHEK2: Females have a 23-27% lifetime risk of breast cancer. For females with a history of breast cancer: 10 year cumulative risk for contralateral breast cancer is 6-8%. Males are thought to be at an increased risk of prostate cancer. The exact risk figure is unknown at this time. No increased risk for colon cancer based on mutation alone.  Research is continuing to help learn more about the cancers associated with CHEK2 pathogenic variants and what the exact risks are to develop these cancers.  Management Recommendations:  Breast Cancer Screening/Risk Reduction: Females: Breast cancer screening includes: Breast awareness beginning at age 18 Monthly self-breast examination beginning at age 80 Clinical breast examination every 6-12 months beginning at age 85 or at the age of the earliest diagnosed breast cancer in the family, if onset was before age 29 Annual mammogram with consideration of tomosynthesis starting at age 43 or 10 years prior to the youngest age of diagnosis, whichever comes first Consider breast MRI with contrast starting at age 58-35 Evidence is insufficient for a prophylactic risk-reducing mastectomy, manage based on family history   Colon Cancer Screening: General population screening is appropriate Manage based on personal and family history   Prostate Cancer Screening:  Consider prostate cancer screening beginning at age 5   This information is based on current understanding of the gene and may change in the future.   Implications for Family Members: Hereditary predisposition to cancer due to pathogenic variants in the CHEK2 gene has autosomal dominant inheritance. This means that an individual with a pathogenic variant has a 50% chance of passing the condition on to his/her offspring. Identification of a pathogenic variant allows for the recognition of at-risk relatives who can pursue testing for the familial variant.   Family members are encouraged to  consider genetic testing for this familial pathogenic variant. As there are generally no childhood cancer risks associated with single pathogenic variants in the CHEK2 gene, individuals in the family are not recommended to have testing until they reach at least 70 years of age. Complimentary testing for the familial variant is available for 90 days. They may contact our office at [phone] for more information or to schedule an appointment. Family members who live outside of the area are encouraged to find a genetic counselor in their area by visiting: [email or web address].   Resources: FORCE (Facing Our Risk of Cancer Empowered) is a resource for those with a hereditary predisposition to develop cancer.  FORCE provides information about risk reduction, advocacy, legislation, and clinical trials.  Additionally, FORCE provides a platform for collaboration and support; which includes: peer navigation, message boards, local support groups, a toll-free helpline, research registry and recruitment, advocate training, published medical research, webinars, brochures, mastectomy photos, and more.  For more information, visit www.facingourrisk.org  Plan:  Mr. Stonehocker will notify his sister of his result.   A copy of the result will be uploaded to his patient portal.   Mr. Antonacci will be followed for his diagnosis of polyposis based on the number and size of his colon polyps.  He is currently scheduled to be seen back for colonoscopy in a year.  Our contact number was provided. Mr. Brathwaite questions were answered to his satisfaction, and he knows he is welcome to call us  at anytime with additional questions or concerns.   Darice Monte, MS, [facility] Licensed, Certified Genetic Counselor [email or web address] phone: [phone]

## 2024-05-26 NOTE — Telephone Encounter (Signed)
 Revealed a CHEK2 result on testing.  Explained that this played a role in his prostate cancer, but not with his polyposis or ureter cancer.  It is a moderate risk gene for breast cancer, therefore his sister is at 50% risk and should consider testing, but that he is not at increased risk at this time as we do not feel it has a strong risk for male breast cancer.  Patient voiced understanding.

## 2024-06-06 DIAGNOSIS — E669 Obesity, unspecified: Secondary | ICD-10-CM | POA: Diagnosis not present

## 2024-06-06 DIAGNOSIS — J189 Pneumonia, unspecified organism: Secondary | ICD-10-CM | POA: Diagnosis not present

## 2024-06-06 DIAGNOSIS — K5903 Drug induced constipation: Secondary | ICD-10-CM | POA: Diagnosis not present

## 2024-06-06 DIAGNOSIS — J4489 Other specified chronic obstructive pulmonary disease: Secondary | ICD-10-CM | POA: Diagnosis not present

## 2024-06-16 ENCOUNTER — Other Ambulatory Visit: Payer: Self-pay | Admitting: Family Medicine

## 2024-06-16 DIAGNOSIS — K5903 Drug induced constipation: Secondary | ICD-10-CM

## 2024-06-23 DIAGNOSIS — R059 Cough, unspecified: Secondary | ICD-10-CM | POA: Diagnosis not present

## 2024-06-23 DIAGNOSIS — J690 Pneumonitis due to inhalation of food and vomit: Secondary | ICD-10-CM | POA: Diagnosis not present

## 2024-06-27 ENCOUNTER — Encounter: Payer: Self-pay | Admitting: Radiology

## 2024-06-27 ENCOUNTER — Other Ambulatory Visit: Payer: Self-pay | Admitting: Nurse Practitioner

## 2024-06-29 ENCOUNTER — Other Ambulatory Visit: Payer: Self-pay | Admitting: Hematology

## 2024-06-29 ENCOUNTER — Other Ambulatory Visit: Payer: Self-pay

## 2024-06-29 DIAGNOSIS — C7951 Secondary malignant neoplasm of bone: Secondary | ICD-10-CM

## 2024-06-29 MED ORDER — OXYCODONE HCL 5 MG PO TABS
5.0000 mg | ORAL_TABLET | ORAL | 0 refills | Status: DC | PRN
  Filled 2024-06-29: qty 180, 30d supply, fill #0

## 2024-06-30 ENCOUNTER — Other Ambulatory Visit (HOSPITAL_COMMUNITY): Payer: Self-pay

## 2024-06-30 ENCOUNTER — Other Ambulatory Visit

## 2024-07-03 ENCOUNTER — Ambulatory Visit: Admitting: Internal Medicine

## 2024-07-05 ENCOUNTER — Inpatient Hospital Stay: Admission: RE | Admit: 2024-07-05 | Discharge: 2024-07-05 | Attending: Gastroenterology

## 2024-07-05 DIAGNOSIS — N281 Cyst of kidney, acquired: Secondary | ICD-10-CM | POA: Diagnosis not present

## 2024-07-05 DIAGNOSIS — K5730 Diverticulosis of large intestine without perforation or abscess without bleeding: Secondary | ICD-10-CM | POA: Diagnosis not present

## 2024-07-05 DIAGNOSIS — D131 Benign neoplasm of stomach: Secondary | ICD-10-CM | POA: Diagnosis not present

## 2024-07-05 MED ORDER — IOPAMIDOL (ISOVUE-300) INJECTION 61%
100.0000 mL | Freq: Once | INTRAVENOUS | Status: AC | PRN
  Administered 2024-07-05: 14:00:00 100 mL via INTRAVENOUS

## 2024-07-06 DIAGNOSIS — E669 Obesity, unspecified: Secondary | ICD-10-CM | POA: Diagnosis not present

## 2024-07-06 DIAGNOSIS — J189 Pneumonia, unspecified organism: Secondary | ICD-10-CM | POA: Diagnosis not present

## 2024-07-06 DIAGNOSIS — K5903 Drug induced constipation: Secondary | ICD-10-CM | POA: Diagnosis not present

## 2024-07-06 DIAGNOSIS — J4489 Other specified chronic obstructive pulmonary disease: Secondary | ICD-10-CM | POA: Diagnosis not present

## 2024-07-06 NOTE — Telephone Encounter (Signed)
 I called patient and it was a mistake.

## 2024-07-18 ENCOUNTER — Other Ambulatory Visit: Payer: Self-pay | Admitting: Internal Medicine

## 2024-07-18 ENCOUNTER — Other Ambulatory Visit: Payer: Self-pay | Admitting: Primary Care

## 2024-07-18 DIAGNOSIS — J4520 Mild intermittent asthma, uncomplicated: Secondary | ICD-10-CM

## 2024-07-18 DIAGNOSIS — I10 Essential (primary) hypertension: Secondary | ICD-10-CM

## 2024-07-19 ENCOUNTER — Ambulatory Visit

## 2024-07-19 VITALS — Ht 68.0 in | Wt 220.0 lb

## 2024-07-19 DIAGNOSIS — Z0000 Encounter for general adult medical examination without abnormal findings: Secondary | ICD-10-CM

## 2024-07-19 NOTE — Patient Instructions (Signed)
 [name] Holder,  Thank you for taking the time for your Medicare Wellness Visit. I appreciate your continued commitment to your health goals. Please review the care plan we discussed, and feel free to reach out if I can assist you further.  Please note that Annual Wellness Visits do not include a physical exam. Some assessments may be limited, especially if the visit was conducted virtually. If needed, we may recommend an in-person follow-up with your provider.  Ongoing Care Seeing your primary care provider every 3 to 6 months helps us  monitor your health and provide consistent, personalized care.   Referrals If a referral was made during today's visit and you haven't received any updates within two weeks, please contact the referred provider directly to check on the status.  Recommended Screenings:  Health Maintenance  Topic Date Due   Screening for Lung Cancer  02/15/2021   DTaP/Tdap/Td vaccine (2 - Td or Tdap) 08/26/2022   Medicare Annual Wellness Visit  07/25/2024   Colon Cancer Screening  02/10/2028   Pneumococcal Vaccine for age over 73  Completed   Hepatitis C Screening  Completed   Zoster (Shingles) Vaccine  Completed   Meningitis B Vaccine  Aged Out   Flu Shot  Discontinued   COVID-19 Vaccine  Discontinued       07/19/2024   11:30 AM  Advanced Directives  Does Patient Have a Medical Advance Directive? Yes  Type of Estate Agent of [place];Living will  Copy of Healthcare Power of Attorney in Chart? No - copy requested    Vision: Annual vision screenings are recommended for early detection of glaucoma, cataracts, and diabetic retinopathy. These exams can also reveal signs of chronic conditions such as diabetes and high blood pressure.  Dental: Annual dental screenings help detect early signs of oral cancer, gum disease, and other conditions linked to overall health, including heart disease and diabetes.

## 2024-07-19 NOTE — Progress Notes (Signed)
 "  Chief Complaint  Patient presents with   Medicare Wellness     Subjective:   [name] Holder is a 68 y.o. male who presents for a Medicare Annual Wellness Visit.  Visit info / Clinical Intake: Medicare Wellness Visit Type:: Subsequent Annual Wellness Visit Persons participating in visit and providing information:: patient Medicare Wellness Visit Mode:: Video Since this visit was completed virtually, some vitals may be partially provided or unavailable. Missing vitals are due to the limitations of the virtual format.: Unable to obtain vitals - no equipment If Telephone or Video please confirm:: I connected with patient using audio/video enable telemedicine. I verified patient identity with two identifiers, discussed telehealth limitations, and patient agreed to proceed. Patient Location:: home Provider Location:: clinic Interpreter Needed?: No Pre-visit prep was completed: yes AWV questionnaire completed by patient prior to visit?: no Living arrangements:: lives with spouse/significant other Patient's Overall Health Status Rating: (!) fair Typical amount of pain: some Does pain affect daily life?: (!) yes Are you currently prescribed opioids?: (!) yes  Dietary Habits and Nutritional Risks How many meals a day?: 2 Most meals are obtained by: preparing own meals In the last 2 weeks, have you had any of the following?: none (constipation frm opioids and causing diarrhea) Diabetic:: no  Functional Status Activities of Daily Living (to include ambulation/medication): Independent Ambulation: Independent with device- listed below Home Assistive Devices/Equipment: Walker (specify Type); Wheelchair (rollater;w/c for long walks) Medication Administration: Independent Home Management (perform basic housework or laundry): Needs assistance (comment) (wife does) Manage your own finances?: (!) no (wife does) Primary transportation is: family / friends Concerns about hearing?: (!) yes  (wife says needs hearing aids) Uses hearing aids?: no Hear whispered voice?: (!) no *in-person visit only*  Fall Screening Falls in the past year?: 1 (broke index lft finger) Number of falls in past year: 1 Was there an injury with Fall?: 1 Fall Risk Category Calculator: 3 Patient Fall Risk Level: High Fall Risk  Fall Risk Patient at Risk for Falls Due to: Impaired balance/gait; Impaired mobility; Orthopedic patient Fall risk Follow up: Falls evaluation completed; Education provided; Falls prevention discussed  Home and Transportation Safety: All rugs have non-skid backing?: yes All stairs or steps have railings?: yes (has ramp) Grab bars in the bathtub or shower?: (!) no (shower chair) Have non-skid surface in bathtub or shower?: yes Good home lighting?: yes Regular seat belt use?: yes Hospital stays in the last year:: no  Cognitive Assessment Difficulty concentrating, remembering, or making decisions? : no Will 6CIT or Mini Cog be Completed: yes What year is it?: 0 points What month is it?: 0 points Give patient an address phrase to remember (5 components): [address] [place] About what time is it?: 0 points Count backwards from 20 to 1: 0 points Say the months of the year in reverse: 0 points Repeat the address phrase from earlier: 0 points 6 CIT Score: 0 points  Advance Directives (For Healthcare) Does Patient Have a Medical Advance Directive?: Yes Type of Advance Directive: Healthcare Power of [place]; Living will Copy of Healthcare Power of Attorney in Chart?: No - copy requested Copy of Living Will in Chart?: No - copy requested  Reviewed/Updated  Reviewed/Updated: Reviewed All (Medical, Surgical, Family, Medications, Allergies, Care Teams, Patient Goals)    Allergies (verified) Amlodipine  and Tetracycline   Current Medications (verified) Outpatient Encounter Medications as of 07/19/2024  Medication Sig   abiraterone  acetate (ZYTIGA ) 250 MG  tablet Take 4 tablets (1,000 mg  total) by mouth daily.   acetaminophen  (TYLENOL ) 325 MG tablet Take 325 mg by mouth daily as needed for fever.   albuterol  (PROVENTIL ) (2.5 MG/3ML) 0.083% nebulizer solution INHALE THREE ML by NEBULIZER EVERY 6 HOURS AS NEEDED FOR WHEEZING OR SHORTNESS OF BREATH   albuterol  (VENTOLIN  HFA) 108 (90 Base) MCG/ACT inhaler INHALE 2 PUFFS INTO LUNGS EVERY 6 HOURS AS NEEDED FOR WHEEZING OR SHORTNESS OF BREATH   azelastine  (ASTELIN ) 0.1 % nasal spray Place 1 spray into both nostrils 2 (two) times daily. Use in each nostril as directed   CALCIUM -VITAMIN D  PO Take 1 tablet by mouth 2 (two) times daily.    carvedilol  (COREG ) 3.125 MG tablet TAKE ONE TABLET TWICE DAILY WITH FOOD   denosumab  (PROLIA ) 60 MG/ML SOSY injection Inject 60 mg into the skin every 6 (six) months.   docusate sodium  (COLACE) 50 MG capsule Take 50 mg by mouth daily.   DULoxetine  (CYMBALTA ) 20 MG capsule TAKE ONE CAPSULE DAILY   Famotidine  (PEPCID  AC PO) Take 1 tablet by mouth at bedtime.   fluticasone  (FLONASE ) 50 MCG/ACT nasal spray 2 SPRAYS INTO EACH NOSTRIL DAILY   Fluticasone -Umeclidin-Vilant (TRELEGY ELLIPTA ) 100-62.5-25 MCG/ACT AEPB Inhale 1 puff into the lungs daily.   furosemide  (LASIX ) 20 MG tablet TAKE ONE TABLET TWICE DAILY   leuprolide  (LUPRON ) 11.25 MG injection Inject 11.25 mg into the muscle every 3 (three) months.   LINZESS  145 MCG CAPS capsule TAKE 1 CAPSULE BY MOUTH EVERY DAY BEFORE BREAKFAST   meclizine  (ANTIVERT ) 12.5 MG tablet Take 1 tablet (12.5 mg total) by mouth 3 (three) times daily as needed for dizziness.   Multiple Vitamins-Minerals (CENTRUM SILVER 50+MEN PO) Take 1 tablet by mouth daily.   NARCAN  4 MG/0.1ML LIQD nasal spray kit Place 0.1 sprays (0.4 mg total) into the nose once as needed (overdose).   olmesartan  (BENICAR ) 20 MG tablet Take 1 tablet (20 mg total) by mouth 2 (two) times daily.   oxyCODONE  (OXY IR/ROXICODONE ) 5 MG immediate release tablet Take 1 tablet (5 mg) by  mouth every 4 hours as needed for severe pain.   polyethylene glycol powder (GLYCOLAX /MIRALAX ) 17 GM/SCOOP powder Take 17 g (1 capful) by mouth daily as directed.   predniSONE  (DELTASONE ) 5 MG tablet Take 1 tablet (5 mg total) by mouth 2 (two) times daily. Ok to increase to 10mg  bid when he has infection or other acute illness   rosuvastatin  (CRESTOR ) 10 MG tablet TAKE ONE TABLET BY MOUTH DAILY   solifenacin (VESICARE) 5 MG tablet Take 5 mg by mouth daily.   triamcinolone  cream (KENALOG ) 0.1 % Apply 1 Application topically 2 (two) times daily.   No facility-administered encounter medications on file as of 07/19/2024.    History: Past Medical History:  Diagnosis Date   Allergic rhinitis    At risk for sleep apnea    STOP-BANG SCORE = 5    (routed to pt's pcp 09-08-2018)   Bladder cancer (HCC) dx'd 2020   Chronic constipation    Chronic low back pain    Chronic pain    DOE (dyspnea on exertion)    Family history of prostate cancer    History of kidney stones    History of recent pneumonia 08/10/2018   acute bronchopneumonia;  follow-up cxr 08-28-2018 in epic   History of vertebral compression fracture    T10 and T12 s/p kyphoplasty   Hx of colonic polyps    Hyperplasia of prostate with lower urinary tract symptoms (LUTS)    Hypertension  Mild persistent asthma    followed by pcp   Numbness in both legs    secondary to a fall, per pt has had work-up and couldn't find anything   Osteopenia    Prostate cancer metastatic to pelvis [facility]) urologist-- dr borden/  oncologist -- dr amadeo   dx 2014 advanced prostate cancer with pelvid adenopathy,  Stage T2c, Gleason 7;  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   Wears glasses    Past Surgical History:  Procedure Laterality Date   COLONOSCOPY     IR KYPHO EA ADDL LEVEL THORACIC OR LUMBAR  08/17/2018   IR KYPHO EA ADDL LEVEL THORACIC OR LUMBAR  08/26/2018   IR KYPHO LUMBAR INC FX REDUCE BONE BX UNI/BIL CANNULATION INC/IMAGING  08/26/2018   IR KYPHO  THORACIC WITH BONE BIOPSY  08/17/2018   IR RADIOLOGIST EVAL & MGMT  08/11/2018   IR RADIOLOGIST EVAL & MGMT  08/24/2018   LYMPHADENECTOMY Left 01/05/2013   Procedure: ROBOTIC LYMPHADENECTOMY;  Surgeon: Noretta Ferrara, MD;  Location: [facility];  Service: Urology;  Laterality: Left;   PROSTATE BIOPSY     11/12 positive biopsies   ROTATOR CUFF REPAIR Left 2012   TRANSURETHRAL RESECTION OF BLADDER TUMOR N/A 09/15/2018   Procedure: TRANSURETHRAL RESECTION OF BLADDER TUMOR (TURBT)WITH CYSTOSCOPY/ POST OPERATIVE INSTILLATION OF GEMCITABINE ;  Surgeon: Ferrara Glance, MD;  Location: [facility];  Service: Urology;  Laterality: N/A;  GENERAL ANESTHESIA WITH PARALYSIS   UMBILICAL HERNIA REPAIR  09-02-2001   dr t. 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[employer]    Comment: retired  Tobacco Use   Smoking status: Former    Current packs/day: 0.00    Average packs/day: 1 pack/day for 25.0 years (25.0 ttl pk-yrs)    Types: Cigarettes    Start date: 09/09/1987    Quit date: 09/08/2012    Years since quitting: 11.8   Smokeless tobacco: Never  Vaping Use   Vaping status: Former   Start date: 09/08/2012   Quit date: 07/08/2018   Devices: e-cigarette,  e-blu  Substance and Sexual Activity   Alcohol use: Not Currently    Alcohol/week: 2.0 standard drinks of alcohol    Types: 2 Glasses of wine per week   Drug use: Never   Sexual activity: Not on file   Tobacco Counseling Counseling given: Not Answered  SDOH Screenings   Food Insecurity: No Food Insecurity (07/19/2024)  Housing: Unknown (07/19/2024)   Transportation Needs: No Transportation Needs (07/19/2024)  Utilities: Not At Risk (07/19/2024)  Alcohol Screen: Low Risk (07/26/2023)  Depression (PHQ2-9): Low Risk (07/19/2024)  Financial Resource Strain: Low Risk (07/26/2023)  Physical Activity: Inactive (07/19/2024)  Social Connections: Moderately Isolated (07/19/2024)  Stress: No Stress Concern Present (07/19/2024)  Tobacco Use: Medium Risk (07/19/2024)  Health Literacy: Inadequate Health Literacy (07/19/2024)   See flowsheets for full screening details  Depression Screen PHQ 2 & 9 Depression Scale- Over the past 2 weeks, how often have you been bothered  by any of the following problems? Little interest or pleasure in doing things: 0 Feeling down, depressed, or hopeless (PHQ Adolescent also includes...irritable): 1 PHQ-2 Total Score: 1 Trouble falling or staying asleep, or sleeping too much: 1 Feeling tired or having little energy: 0 Poor appetite or overeating (PHQ Adolescent also includes...weight loss): 0 Feeling bad about yourself - or that you are a failure or have let yourself or your family down: 0 Trouble concentrating on things, such as reading the newspaper or watching television (PHQ Adolescent also includes...like school work): 0 Moving or speaking so slowly that other people could have noticed. Or the opposite - being so fidgety or restless that you have been moving around a lot more than usual: 0 Thoughts that you would be better off dead, or of hurting yourself in some way: 0 PHQ-9 Total Score: 1 If you checked off any problems, how difficult have these problems made it for you to do your work, take care of things at home, or get along with other people?: Not difficult at all  Depression Treatment Depression Interventions/Treatment : EYV7-0 Score <4 Follow-up Not Indicated     Goals Addressed             This Visit's Progress    I want to walk more       To join [employer] Program at the [facility] and use  a systems analyst.   Not on track            Objective:    Today's Vitals   07/19/24 1130  Weight: 220 lb (99.8 kg)  Height: 5' 8 (1.727 m)   Body mass index is 33.45 kg/m.  Hearing/Vision screen Vision Screening - Comments:: UTD w/visits to Dr Ladora Immunizations and Health Maintenance Health Maintenance  Topic Date Due   Lung Cancer Screening  02/15/2021   DTaP/Tdap/Td (2 - Td or Tdap) 08/26/2022   Medicare Annual Wellness (AWV)  07/25/2024   Colonoscopy  02/10/2028   Pneumococcal Vaccine: 50+ Years  Completed   Hepatitis C Screening  Completed   Zoster Vaccines- Shingrix  Completed   Meningococcal B Vaccine  Aged Out   Influenza Vaccine  Discontinued   COVID-19 Vaccine  Discontinued        Assessment/Plan:  This is a routine wellness examination for [name] Holder.  Patient Care Team: Joshua Debby CROME, MD as PCP - General (Internal Medicine) Nellene Claw as Consulting Physician (Gastroenterology) Ladora, My [place], [place] as Referring Physician (Optometry) Lanny Callander, MD as Attending Physician (Hematology and Oncology) Tommas Pears, MD as Referring Physician (Endocrinology) Kara Dorn NOVAK, MD as Consulting Physician (Pulmonary Disease)  I have personally reviewed and noted the following in the patients chart:   Medical and social history Use of alcohol, tobacco or illicit drugs  Current medications and supplements including opioid prescriptions. Functional ability and status Nutritional status Physical activity Advanced directives List of other physicians Hospitalizations, surgeries, and ER visits in previous 12 months Vitals Screenings to include cognitive, depression, and falls Referrals and appointments  No orders of the defined types were placed in this encounter.  In addition, I have reviewed and discussed with patient certain preventive protocols, quality metrics, and best practice recommendations. A written personalized care plan for preventive  services as well as general preventive health recommendations were provided to patient.   Erminio CROME Saris, LPN   [id number]   No follow-ups on file.  After Visit Summary: (MyChart) Due to this being a telephonic visit, the after visit  summary with patients personalized plan was offered to patient via MyChart   Nurse Notes: No voiced or noted concerns at this time Patient advised to keep follow-up appointment with PCP (08/01/24) Appointment(s) made: (AWV/CPE Jan 2027) HM Addressed: UTD  "

## 2024-07-27 ENCOUNTER — Encounter: Payer: Self-pay | Admitting: Hematology

## 2024-08-01 ENCOUNTER — Ambulatory Visit: Payer: Self-pay | Admitting: Internal Medicine

## 2024-08-01 ENCOUNTER — Encounter: Payer: Self-pay | Admitting: Hematology

## 2024-08-01 ENCOUNTER — Encounter: Payer: Self-pay | Admitting: Internal Medicine

## 2024-08-01 VITALS — BP 138/84 | HR 74 | Temp 98.0°F | Resp 16 | Ht 68.0 in | Wt 223.8 lb

## 2024-08-01 DIAGNOSIS — I10 Essential (primary) hypertension: Secondary | ICD-10-CM | POA: Diagnosis not present

## 2024-08-01 DIAGNOSIS — Z0001 Encounter for general adult medical examination with abnormal findings: Secondary | ICD-10-CM

## 2024-08-01 DIAGNOSIS — E785 Hyperlipidemia, unspecified: Secondary | ICD-10-CM | POA: Diagnosis not present

## 2024-08-01 DIAGNOSIS — H9193 Unspecified hearing loss, bilateral: Secondary | ICD-10-CM | POA: Diagnosis not present

## 2024-08-01 DIAGNOSIS — R011 Cardiac murmur, unspecified: Secondary | ICD-10-CM

## 2024-08-01 DIAGNOSIS — Z23 Encounter for immunization: Secondary | ICD-10-CM | POA: Insufficient documentation

## 2024-08-01 DIAGNOSIS — Z0000 Encounter for general adult medical examination without abnormal findings: Secondary | ICD-10-CM | POA: Diagnosis not present

## 2024-08-01 DIAGNOSIS — E222 Syndrome of inappropriate secretion of antidiuretic hormone: Secondary | ICD-10-CM

## 2024-08-01 DIAGNOSIS — Z7952 Long term (current) use of systemic steroids: Secondary | ICD-10-CM | POA: Insufficient documentation

## 2024-08-01 DIAGNOSIS — E271 Primary adrenocortical insufficiency: Secondary | ICD-10-CM | POA: Diagnosis not present

## 2024-08-01 LAB — BASIC METABOLIC PANEL WITH GFR
BUN: 10 mg/dL (ref 6–23)
CO2: 24 meq/L (ref 19–32)
Calcium: 9.1 mg/dL (ref 8.4–10.5)
Chloride: 100 meq/L (ref 96–112)
Creatinine, Ser: 0.8 mg/dL (ref 0.40–1.50)
GFR: 89.66 mL/min
Glucose, Bld: 102 mg/dL — ABNORMAL HIGH (ref 70–99)
Potassium: 4 meq/L (ref 3.5–5.1)
Sodium: 134 meq/L — ABNORMAL LOW (ref 135–145)

## 2024-08-01 LAB — TSH: TSH: 0.42 u[IU]/mL (ref 0.35–5.50)

## 2024-08-01 LAB — CORTISOL: Cortisol, Plasma: 1.6 ug/dL

## 2024-08-01 MED ORDER — ROSUVASTATIN CALCIUM 10 MG PO TABS: 10.0000 mg | ORAL_TABLET | Freq: Every day | ORAL | 0 refills | Status: DC

## 2024-08-01 NOTE — Patient Instructions (Signed)
 Health Maintenance, Male  Adopting a healthy lifestyle and getting preventive care are important in promoting health and wellness. Ask your health care provider about:  The right schedule for you to have regular tests and exams.  Things you can do on your own to prevent diseases and keep yourself healthy.  What should I know about diet, weight, and exercise?  Eat a healthy diet    Eat a diet that includes plenty of vegetables, fruits, low-fat dairy products, and lean protein.  Do not eat a lot of foods that are high in solid fats, added sugars, or sodium.  Maintain a healthy weight  Body mass index (BMI) is a measurement that can be used to identify possible weight problems. It estimates body fat based on height and weight. Your health care provider can help determine your BMI and help you achieve or maintain a healthy weight.  Get regular exercise  Get regular exercise. This is one of the most important things you can do for your health. Most adults should:  Exercise for at least 150 minutes each week. The exercise should increase your heart rate and make you sweat (moderate-intensity exercise).  Do strengthening exercises at least twice a week. This is in addition to the moderate-intensity exercise.  Spend less time sitting. Even light physical activity can be beneficial.  Watch cholesterol and blood lipids  Have your blood tested for lipids and cholesterol at 71 years of age, then have this test every 5 years.  You may need to have your cholesterol levels checked more often if:  Your lipid or cholesterol levels are high.  You are older than 71 years of age.  You are at high risk for heart disease.  What should I know about cancer screening?  Many types of cancers can be detected early and may often be prevented. Depending on your health history and family history, you may need to have cancer screening at various ages. This may include screening for:  Colorectal cancer.  Prostate cancer.  Skin cancer.  Lung  cancer.  What should I know about heart disease, diabetes, and high blood pressure?  Blood pressure and heart disease  High blood pressure causes heart disease and increases the risk of stroke. This is more likely to develop in people who have high blood pressure readings or are overweight.  Talk with your health care provider about your target blood pressure readings.  Have your blood pressure checked:  Every 3-5 years if you are 24-52 years of age.  Every year if you are 3 years old or older.  If you are between the ages of 60 and 72 and are a current or former smoker, ask your health care provider if you should have a one-time screening for abdominal aortic aneurysm (AAA).  Diabetes  Have regular diabetes screenings. This checks your fasting blood sugar level. Have the screening done:  Once every three years after age 66 if you are at a normal weight and have a low risk for diabetes.  More often and at a younger age if you are overweight or have a high risk for diabetes.  What should I know about preventing infection?  Hepatitis B  If you have a higher risk for hepatitis B, you should be screened for this virus. Talk with your health care provider to find out if you are at risk for hepatitis B infection.  Hepatitis C  Blood testing is recommended for:  Everyone born from [birth year] through 1967.  Anyone  with known risk factors for hepatitis C.  Sexually transmitted infections (STIs)  You should be screened each year for STIs, including gonorrhea and chlamydia, if:  You are sexually active and are younger than 71 years of age.  You are older than 71 years of age and your health care provider tells you that you are at risk for this type of infection.  Your sexual activity has changed since you were last screened, and you are at increased risk for chlamydia or gonorrhea. Ask your health care provider if you are at risk.  Ask your health care provider about whether you are at high risk for HIV. Your health care provider  may recommend a prescription medicine to help prevent HIV infection. If you choose to take medicine to prevent HIV, you should first get tested for HIV. You should then be tested every 3 months for as long as you are taking the medicine.  Follow these instructions at home:  Alcohol use  Do not drink alcohol if your health care provider tells you not to drink.  If you drink alcohol:  Limit how much you have to 0-2 drinks a day.  Know how much alcohol is in your drink. In the [place] one drink equals one 12 oz bottle of beer (355 mL), one 5 oz glass of wine (148 mL), or one 1 oz glass of hard liquor (44 mL).  Lifestyle  Do not use any products that contain nicotine or tobacco. These products include cigarettes, chewing tobacco, and vaping devices, such as e-cigarettes. If you need help quitting, ask your health care provider.  Do not use street drugs.  Do not share needles.  Ask your health care provider for help if you need support or information about quitting drugs.  General instructions  Schedule regular health, dental, and eye exams.  Stay current with your vaccines.  Tell your health care provider if:  You often feel depressed.  You have ever been abused or do not feel safe at home.  Summary  Adopting a healthy lifestyle and getting preventive care are important in promoting health and wellness.  Follow your health care provider's instructions about healthy diet, exercising, and getting tested or screened for diseases.  Follow your health care provider's instructions on monitoring your cholesterol and blood pressure.  This information is not intended to replace advice given to you by your health care provider. Make sure you discuss any questions you have with your health care provider.  Document Revised: 12/02/2020 Document Reviewed: 12/02/2020  Elsevier Patient Education  2024 [employer].

## 2024-08-01 NOTE — Progress Notes (Signed)
 "  Subjective:  Patient ID: [name] Holder, male    DOB: 05-21-1956  Age: 69 y.o. MRN: [id number]  CC: Hypertension and Annual Exam   HPI [name] Holder presents for a CPX and f/up ----  Discussed the use of AI scribe software for clinical note transcription with the patient, who gave verbal consent to proceed.  History of Present Illness [name] Holder is a 69 year old male with COPD who presents with leg swelling and bowel movement issues.  He has been experiencing swelling in his legs, particularly the left leg, which has worsened since moving to an [employer]nb while his house is being renovated. The swelling is more pronounced on the left, and his legs have been more dependent during the day since moving to the [employer], where he does not have his usual hospital bed or recliner. He denies chest pain and shortness of breath.  He has noticed a consistent weight gain of two to three pounds.  He is experiencing bowel movement irregularities, which he believes are related to his opioid medication. He describes alternating between constipation and diarrhea, which he finds inconvenient. He has been taking Linzess  for constipation for nearly a year and is attempting to increase his dietary fiber intake. He attributes some of the bowel issues to his immobility, which he believes slows his peristalsis.  He is also experiencing hearing difficulties.     Outpatient Medications Prior to Visit  Medication Sig Dispense Refill   abiraterone  acetate (ZYTIGA ) 250 MG tablet Take 4 tablets (1,000 mg total) by mouth daily. 120 tablet 2   acetaminophen  (TYLENOL ) 325 MG tablet Take 325 mg by mouth daily as needed for fever.     albuterol  (PROVENTIL ) (2.5 MG/3ML) 0.083% nebulizer solution INHALE THREE ML by NEBULIZER EVERY 6 HOURS AS NEEDED FOR WHEEZING OR SHORTNESS OF BREATH 360 mL 2   albuterol  (VENTOLIN  HFA) 108 (90 Base) MCG/ACT inhaler INHALE 2 PUFFS INTO LUNGS EVERY 6 HOURS AS NEEDED FOR WHEEZING OR  SHORTNESS OF BREATH 18 g 1   azelastine  (ASTELIN ) 0.1 % nasal spray Place 1 spray into both nostrils 2 (two) times daily. Use in each nostril as directed 90 mL 3   CALCIUM -VITAMIN D  PO Take 1 tablet by mouth 2 (two) times daily.      carvedilol  (COREG ) 3.125 MG tablet TAKE ONE TABLET TWICE DAILY WITH FOOD 180 tablet 0   denosumab  (PROLIA ) 60 MG/ML SOSY injection Inject 60 mg into the skin every 6 (six) months.     docusate sodium  (COLACE) 50 MG capsule Take 50 mg by mouth daily.     DULoxetine  (CYMBALTA ) 20 MG capsule TAKE ONE CAPSULE DAILY 30 capsule 1   Famotidine  (PEPCID  AC PO) Take 1 tablet by mouth at bedtime.     fluticasone  (FLONASE ) 50 MCG/ACT nasal spray 2 SPRAYS INTO EACH NOSTRIL DAILY 48 g 1   Fluticasone -Umeclidin-Vilant (TRELEGY ELLIPTA ) 100-62.5-25 MCG/ACT AEPB Inhale 1 puff into the lungs daily. 120 each 3   furosemide  (LASIX ) 20 MG tablet TAKE ONE TABLET TWICE DAILY 180 tablet 0   leuprolide  (LUPRON ) 11.25 MG injection Inject 11.25 mg into the muscle every 3 (three) months.     LINZESS  145 MCG CAPS capsule TAKE 1 CAPSULE BY MOUTH EVERY DAY BEFORE BREAKFAST 30 capsule 2   meclizine  (ANTIVERT ) 12.5 MG tablet Take 1 tablet (12.5 mg total) by mouth 3 (three) times daily as needed for dizziness. 270 tablet 0   Multiple Vitamins-Minerals (CENTRUM SILVER 50+MEN PO) Take 1 tablet  by mouth daily.     NARCAN  4 MG/0.1ML LIQD nasal spray kit Place 0.1 sprays (0.4 mg total) into the nose once as needed (overdose). 1 each 0   olmesartan  (BENICAR ) 20 MG tablet Take 1 tablet (20 mg total) by mouth 2 (two) times daily. 180 tablet 1   oxyCODONE  (OXY IR/ROXICODONE ) 5 MG immediate release tablet Take 1 tablet (5 mg) by mouth every 4 hours as needed for severe pain. 180 tablet 0   polyethylene glycol powder (GLYCOLAX /MIRALAX ) 17 GM/SCOOP powder Take 17 g (1 capful) by mouth daily as directed. 510 g 6   predniSONE  (DELTASONE ) 5 MG tablet Take 1 tablet (5 mg total) by mouth 2 (two) times daily. Ok to  increase to 10mg  bid when he has infection or other acute illness 100 tablet 3   solifenacin (VESICARE) 5 MG tablet Take 5 mg by mouth daily.     triamcinolone  cream (KENALOG ) 0.1 % Apply 1 Application topically 2 (two) times daily. 15 g 0   rosuvastatin  (CRESTOR ) 10 MG tablet TAKE ONE TABLET BY MOUTH DAILY 90 tablet 0   No facility-administered medications prior to visit.    ROS Review of Systems  Constitutional:  Positive for unexpected weight change (wt gain). Negative for appetite change, chills, diaphoresis, fatigue and fever.  HENT: Negative.  Negative for dental problem and trouble swallowing.   Eyes: Negative.   Respiratory:  Positive for shortness of breath. Negative for cough, chest tightness, wheezing and stridor.   Cardiovascular:  Negative for chest pain, palpitations and leg swelling.  Gastrointestinal:  Positive for constipation and diarrhea. Negative for abdominal distention, abdominal pain, blood in stool, nausea and vomiting.  Endocrine: Negative.   Genitourinary: Negative.  Negative for difficulty urinating.  Musculoskeletal: Negative.  Negative for arthralgias and myalgias.  Skin: Negative.  Negative for color change.  Neurological: Negative.  Negative for dizziness and weakness.  Hematological:  Negative for adenopathy. Does not bruise/bleed easily.  Psychiatric/Behavioral: Negative.      Objective:  BP 138/84 (BP Location: Left Arm, Patient Position: Sitting, Cuff Size: Normal)   Pulse 74   Temp 98 F (36.7 C) (Temporal)   Resp 16   Ht 5' 8 (1.727 m)   Wt 223 lb 12.8 oz (101.5 kg)   SpO2 97%   BMI 34.03 kg/m   BP Readings from Last 3 Encounters:  08/01/24 138/84  05/03/24 (!) 142/82  02/01/24 111/75    Wt Readings from Last 3 Encounters:  08/01/24 223 lb 12.8 oz (101.5 kg)  07/19/24 220 lb (99.8 kg)  05/03/24 220 lb 1.6 oz (99.8 kg)    Physical Exam Vitals reviewed.  Constitutional:      General: He is not in acute distress.    Appearance:  He is ill-appearing. He is not toxic-appearing or diaphoretic.  HENT:     Mouth/Throat:     Mouth: Mucous membranes are moist.  Eyes:     General: No scleral icterus.    Conjunctiva/sclera: Conjunctivae normal.  Cardiovascular:     Rate and Rhythm: Normal rate and regular rhythm. Occasional Extrasystoles are present.    Heart sounds: Murmur heard.     Systolic murmur is present with a grade of 2/6.     No friction rub. No gallop.     Comments: EKG-- SR with PCVS'c (new), 74 bpm No LVH, Q waves, or ST/T wave changes  Pulmonary:     Effort: Pulmonary effort is normal.     Breath sounds: No stridor. No  wheezing, rhonchi or rales.  Abdominal:     General: Abdomen is protuberant. Bowel sounds are normal. There is no distension.     Palpations: Abdomen is soft. There is no hepatomegaly, splenomegaly or mass.     Tenderness: There is no abdominal tenderness. There is no guarding.  Musculoskeletal:        General: Normal range of motion.     Cervical back: Neck supple.     Right lower leg: No edema.     Left lower leg: No edema.  Lymphadenopathy:     Cervical: No cervical adenopathy.  Skin:    General: Skin is warm and dry.     Findings: No rash.  Neurological:     General: No focal deficit present.  Psychiatric:        Mood and Affect: Mood normal.        Behavior: Behavior normal.     Lab Results  Component Value Date   WBC 9.0 05/03/2024   HGB 12.5 (L) 05/03/2024   HCT 35.1 (L) 05/03/2024   PLT 252 05/03/2024   GLUCOSE 102 (H) 08/01/2024   CHOL 191 05/17/2023   TRIG 157.0 (H) 05/17/2023   HDL 44.20 05/17/2023   LDLCALC 115 (H) 05/17/2023   ALT 16 05/03/2024   AST 13 (L) 05/03/2024   NA 134 (L) 08/01/2024   K 4.0 08/01/2024   CL 100 08/01/2024   CREATININE 0.80 08/01/2024   BUN 10 08/01/2024   CO2 24 08/01/2024   TSH 0.42 08/01/2024   PSA 3.4 08/12/2023   INR 1.1 09/17/2021   HGBA1C 5.6 12/01/2022    CT ENTERO ABD/PELVIS W CONTAST Result Date:  07/05/2024 CLINICAL DATA:  Gastric adenoma. History of prostate carcinoma. * Tracking Code: BO * EXAM: CT ABDOMEN AND PELVIS WITH CONTRAST (ENTEROGRAPHY) TECHNIQUE: Multidetector CT of the abdomen and pelvis during bolus administration of intravenous contrast. Negative oral contrast was given. RADIATION DOSE REDUCTION: This exam was performed according to the departmental dose-optimization program which includes automated exposure control, adjustment of the mA and/or kV according to patient size and/or use of iterative reconstruction technique. CONTRAST:  100mL ISOVUE -300 IOPAMIDOL  (ISOVUE -300) INJECTION 61% COMPARISON:  CT scan abdomen pelvis from 09/16/2021. FINDINGS: Lower chest: There are patchy atelectatic changes in the visualized lung bases. No overt consolidation. No pleural effusion. The heart is normal in size. No pericardial effusion. Hepatobiliary: The liver is normal in size. Non-cirrhotic configuration. No suspicious mass. No intrahepatic or extrahepatic bile duct dilation. No calcified gallstones. Normal gallbladder wall thickness. No pericholecystic inflammatory changes. Pancreas: Unremarkable. No pancreatic ductal dilatation or surrounding inflammatory changes. Spleen: Within normal limits. No focal lesion. Adrenals/Urinary Tract: Adrenal glands are unremarkable. No suspicious renal mass. There multiple simple cortical cysts in the right kidney with largest arising from the interpolar region, laterally measuring up to 2.6 x 3.7 cm and a sinus cyst in the left kidney upper pole measuring up to 2.3 x 3.5 cm. No nephroureterolithiasis or obstructive uropathy on either side. Mild bladder wall trabeculations noted. No focal mass or bladder calculi. No perivesical fat stranding. Stomach/Bowel: No focal suspicious lesion noted within the stomach. There is an approximately 5 x 12 mm outpouching arising from the posterior wall of the pylorus of the stomach, favored to represent small gastric diverticulum.  No disproportionate dilation of the small or large bowel loops. No evidence of abnormal bowel wall thickening or inflammatory changes. The appendix is unremarkable. There are scattered diverticula mainly in the sigmoid colon, without imaging signs  of diverticulitis. Vascular/Lymphatic: No ascites or pneumoperitoneum. No abdominal or pelvic lymphadenopathy, by size criteria. No aneurysmal dilation of the major abdominal arteries. There are moderate peripheral atherosclerotic vascular calcifications of the aorta and its major branches. Reproductive: Limited evaluation of the prostate gland on the CT scan exam. However, note is made of ill-defined hyperattenuating masslike lesion along the anterior aspect of the prostate base/mid gland region, which is concerning for local recurrent tumor. Correlate clinically and with serum PSA levels to determine the need for dedicated MRI pelvis with prostate protocol. There is also a new, 10 x 11 mm soft tissue attenuation nodule in the posterior pelvis adjacent to the right seminal vesicle, concerning for locoregional nodal metastases. Bilateral seminal vesicles also appear heterogeneous and hyperattenuating, indeterminate. Other: There are fat containing umbilical and bilateral inguinal hernias. The soft tissues and abdominal wall are otherwise unremarkable. Musculoskeletal: No suspicious osseous lesions. There are moderate multilevel degenerative changes in the visualized spine. Redemonstration of multilevel compression deformities of the lower thoracic/upper lumbar spine including kyphoplasty changes in the T10, T12, L1 and L2 vertebrae. No significant interval change in the interim. IMPRESSION: 1. No focal suspicious lesion noted within the stomach. There is an approximately 5 x 12 mm outpouching arising from the posterior wall of the pylorus of the stomach, favored to represent small gastric diverticulum. 2. Limited evaluation of the prostate gland on the CT scan exam.  However, note is made of ill-defined hyperattenuating masslike lesion along the anterior aspect of the prostate base/mid gland region, which is concerning for local recurrent tumor. Correlate clinically and with serum PSA levels to determine the need for dedicated MRI pelvis with prostate protocol. There is also a 10 x 11 mm nodule in the posterior pelvis adjacent to the right seminal vesicle, concerning for locoregional metastases. Bilateral seminal vesicles also appear heterogeneous and hyperattenuating, indeterminate. 3. Multiple other nonacute observations, as described above. Aortic Atherosclerosis (ICD10-I70.0). Electronically Signed   By: Ree Molt M.D.   On: 07/05/2024 15:38    Assessment & Plan:   Encounter for general adult medical examination with abnormal findings- Exam completed, labs reviewed, vaccines reviewed and updated, cancer screenings are UTD, pt ed material was given.   SIADH (syndrome of inappropriate ADH production) -     Basic metabolic panel with GFR; Future -     Cortisol; Future  Adrenal insufficiency (Addison's disease) (HCC)- BP is normal. Will continue the current prednisone  dose. -     Basic metabolic panel with GFR; Future -     Cortisol; Future  Need for prophylactic vaccination with combined diphtheria-tetanus-pertussis (DTP) vaccine -     Boostrix ; Inject 0.5 mLs into the muscle once for 1 dose.  Dispense: 0.5 mL; Refill: 0  Essential hypertension- BP is well controlled. EKG is negative for LVH. -     EKG 12-Lead -     Basic metabolic panel with GFR; Future -     Cortisol; Future -     TSH; Future  Hyperlipidemia with target LDL less than 100 -     Rosuvastatin  Calcium ; Take 1 tablet (10 mg total) by mouth daily.  Dispense: 90 tablet; Refill: 0  Bilateral hearing loss, unspecified hearing loss type -     Ambulatory referral to Audiology  Systolic murmur -     ECHOCARDIOGRAM COMPLETE; Future     Follow-up: Return in about 6 months (around  01/29/2025).  Debby Molt, MD "

## 2024-08-03 ENCOUNTER — Ambulatory Visit: Admitting: Internal Medicine

## 2024-08-03 ENCOUNTER — Ambulatory Visit: Payer: Self-pay | Admitting: Internal Medicine

## 2024-08-03 MED ORDER — BOOSTRIX 5-2.5-18.5 LF-MCG/0.5 IM SUSP: 0.5000 mL | Freq: Once | INTRAMUSCULAR | 0 refills | Status: AC

## 2024-08-04 ENCOUNTER — Other Ambulatory Visit: Payer: Self-pay | Admitting: Internal Medicine

## 2024-08-04 ENCOUNTER — Other Ambulatory Visit: Payer: Self-pay | Admitting: Hematology

## 2024-08-04 DIAGNOSIS — Z8546 Personal history of malignant neoplasm of prostate: Secondary | ICD-10-CM

## 2024-08-04 DIAGNOSIS — I10 Essential (primary) hypertension: Secondary | ICD-10-CM

## 2024-08-07 NOTE — Assessment & Plan Note (Signed)
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  -He developed castration resistant in 2018.  His PSA was up to 11.3 at that time.  -He is status post T10, T12, kyphoplasty (osteoprosis related) completed on January 22 of 2020.  -he has been on Zytiga  1000 mg daily with prednisone  at 5 mg daily started in 2018, Eligard  22.5 mg every 3 months and Prolia  60 mg every 6 months.  -Tolerating treatment well. His PSA has been slowly trending up with latest 3.0 in April 2025, with doubling time more than 6 months.  Will continue current therapy.

## 2024-08-08 ENCOUNTER — Inpatient Hospital Stay: Payer: Self-pay | Admitting: Hematology

## 2024-08-08 ENCOUNTER — Inpatient Hospital Stay: Payer: Self-pay | Attending: Hematology

## 2024-08-08 ENCOUNTER — Other Ambulatory Visit: Payer: Self-pay

## 2024-08-08 ENCOUNTER — Other Ambulatory Visit (HOSPITAL_COMMUNITY): Payer: Self-pay

## 2024-08-08 ENCOUNTER — Inpatient Hospital Stay

## 2024-08-08 ENCOUNTER — Other Ambulatory Visit: Payer: Self-pay | Admitting: Internal Medicine

## 2024-08-08 VITALS — BP 134/80 | HR 78 | Temp 97.9°F | Resp 15 | Ht 68.0 in | Wt 227.0 lb

## 2024-08-08 DIAGNOSIS — C7951 Secondary malignant neoplasm of bone: Secondary | ICD-10-CM

## 2024-08-08 DIAGNOSIS — C61 Malignant neoplasm of prostate: Secondary | ICD-10-CM | POA: Diagnosis not present

## 2024-08-08 DIAGNOSIS — Z5111 Encounter for antineoplastic chemotherapy: Secondary | ICD-10-CM | POA: Insufficient documentation

## 2024-08-08 DIAGNOSIS — Z79899 Other long term (current) drug therapy: Secondary | ICD-10-CM | POA: Insufficient documentation

## 2024-08-08 DIAGNOSIS — C7952 Secondary malignant neoplasm of bone marrow: Secondary | ICD-10-CM

## 2024-08-08 DIAGNOSIS — E785 Hyperlipidemia, unspecified: Secondary | ICD-10-CM

## 2024-08-08 LAB — CBC WITH DIFFERENTIAL (CANCER CENTER ONLY)
Abs Immature Granulocytes: 0.03 K/uL (ref 0.00–0.07)
Basophils Absolute: 0 K/uL (ref 0.0–0.1)
Basophils Relative: 0 %
Eosinophils Absolute: 0.1 K/uL (ref 0.0–0.5)
Eosinophils Relative: 1 %
HCT: 35.2 % — ABNORMAL LOW (ref 39.0–52.0)
Hemoglobin: 12.1 g/dL — ABNORMAL LOW (ref 13.0–17.0)
Immature Granulocytes: 0 %
Lymphocytes Relative: 17 %
Lymphs Abs: 1.8 K/uL (ref 0.7–4.0)
MCH: 29.3 pg (ref 26.0–34.0)
MCHC: 34.4 g/dL (ref 30.0–36.0)
MCV: 85.2 fL (ref 80.0–100.0)
Monocytes Absolute: 0.8 K/uL (ref 0.1–1.0)
Monocytes Relative: 8 %
Neutro Abs: 7.4 K/uL (ref 1.7–7.7)
Neutrophils Relative %: 74 %
Platelet Count: 255 K/uL (ref 150–400)
RBC: 4.13 MIL/uL — ABNORMAL LOW (ref 4.22–5.81)
RDW: 13.3 % (ref 11.5–15.5)
WBC Count: 10.1 K/uL (ref 4.0–10.5)
nRBC: 0 % (ref 0.0–0.2)

## 2024-08-08 LAB — CMP (CANCER CENTER ONLY)
ALT: 22 U/L (ref 0–44)
AST: 19 U/L (ref 15–41)
Albumin: 4.1 g/dL (ref 3.5–5.0)
Alkaline Phosphatase: 38 U/L (ref 38–126)
Anion gap: 11 (ref 5–15)
BUN: 9 mg/dL (ref 8–23)
CO2: 24 mmol/L (ref 22–32)
Calcium: 9.5 mg/dL (ref 8.9–10.3)
Chloride: 97 mmol/L — ABNORMAL LOW (ref 98–111)
Creatinine: 0.83 mg/dL (ref 0.61–1.24)
GFR, Estimated: >60 mL/min
Glucose, Bld: 106 mg/dL — ABNORMAL HIGH (ref 70–99)
Potassium: 4.6 mmol/L (ref 3.5–5.1)
Sodium: 132 mmol/L — ABNORMAL LOW (ref 135–145)
Total Bilirubin: 0.3 mg/dL (ref 0.0–1.2)
Total Protein: 6.3 g/dL — ABNORMAL LOW (ref 6.5–8.1)

## 2024-08-08 LAB — PSA: Prostatic Specific Antigen: 4.24 ng/mL — ABNORMAL HIGH (ref 0.00–4.00)

## 2024-08-08 MED ORDER — DENOSUMAB 60 MG/ML ~~LOC~~ SOSY
60.0000 mg | PREFILLED_SYRINGE | Freq: Once | SUBCUTANEOUS | Status: AC
  Administered 2024-08-08: 60 mg via SUBCUTANEOUS
  Filled 2024-08-08: qty 1

## 2024-08-08 MED ORDER — LEUPROLIDE ACETATE (3 MONTH) 22.5 MG ~~LOC~~ KIT
22.5000 mg | PACK | Freq: Once | SUBCUTANEOUS | Status: AC
  Administered 2024-08-08: 22.5 mg via SUBCUTANEOUS
  Filled 2024-08-08: qty 22.5

## 2024-08-08 MED ORDER — OXYCODONE HCL 5 MG PO TABS
5.0000 mg | ORAL_TABLET | ORAL | 0 refills | Status: AC | PRN
  Filled 2024-08-08: qty 180, 30d supply, fill #0

## 2024-08-08 NOTE — Progress Notes (Signed)
 Prolia  and Eligard  auth pending. Okay to proceed with treatment today per Darlena Clark.  Harlene Nasuti, PharmD Oncology Infusion Pharmacist 08/08/2024 3:13 PM

## 2024-08-08 NOTE — Progress Notes (Unsigned)
 " [name] [facility]   [phone] [phone]   Clinic Follow up Note   Patient Care Team: Joshua Debby CROME, MD as PCP - General (Internal Medicine) Nellene Claw as Consulting Physician (Gastroenterology) [place], [place], [place] as Referring Physician (Optometry) Lanny Callander, MD as Attending Physician (Hematology and Oncology) Tommas Pears, MD as Referring Physician (Endocrinology) Kara Dorn NOVAK, MD as Consulting Physician (Pulmonary Disease)  Date of Service:  08/08/2024  CHIEF COMPLAINT: f/u of prostate cancer  CURRENT THERAPY:  Abiraterone  and prednisone  Eligard  22.5 mg injection every 3 months  Oncology History   Prostate cancer (HCC) -stage IV with node metastasis, castration resistant, Guardant 360 (-) in 2022 -diagnosed in 2014 with lymphadenopathy, Gleason score 7 and PSA of 23.  -He developed castration resistant in 2018.  His PSA was up to 11.3 at that time.  -He is status post T10, T12, kyphoplasty (osteoprosis related) completed on January 22 of 2020.  -he has been on Zytiga  1000 mg daily with prednisone  at 5 mg daily started in 2018, Eligard  22.5 mg every 3 months and Prolia  60 mg every 6 months.  -Tolerating treatment well. His PSA has been slowly trending up with latest 3.0 in April 2025, with doubling time more than 6 months.  Will continue current therapy.  Assessment & Plan Metastatic castration-resistant prostate cancer Disease remains well-managed on abiraterone  (Zytiga ) since 2018, with no evidence of new metastatic disease on recent imaging. PSA was mildly elevated in July but decreased in September; repeat testing is planned. CT scan revealed a possible lesion in the prostate, likely representing untreated primary tumor, as he never received local therapy. He has a germline CHEK2 mutation, relevant for prostate cancer risk. No significant urinary symptoms aside from longstanding slow stream and mild incontinence. Future management  may require additional molecular profiling if disease progression occurs. - Ordered PSA today. - Refilled abiraterone  (Zytiga ) prescription. - Scheduled follow-up in three months with injection. - Planned to call with PSA results and adjust management if necessary. - Planned to order PSMA PET scan if PSA rises significantly (e.g., doubles or >10). - Planned to order Guardian 360 liquid biopsy for tumor mutation profiling if treatment change is indicated.  Chronic backpain Chronic back pain is managed with oxycodone , averaging four tablets daily, primarily during the day. Pain control is generally adequate, with rare need for nighttime dosing. Functional limitations require frequent sitting and lying down; home modifications have been made for accessibility. - Refilled oxycodone  prescription at [facility].  Opioid-induced constipation and diarrhea Alternating constipation and diarrhea occur every three to four days, attributed to opioid therapy. Bowel regularity is managed with Linzess  and glycerin suppositories as needed. Excessive Linzess  use can cause diarrhea. No acute complications reported. - Recommended adjusting Linzess  dosing based on bowel pattern to minimize diarrhea. - Provided education on opioid-induced bowel dysfunction.  Chronic gastritis with benign gastric ulcer Recent endoscopy revealed chronic gastritis and a benign gastric ulcer, with negative biopsies for malignancy and no evidence of precancerous lesions. Surveillance interval established per gastroenterology recommendations. - Reviewed endoscopy and biopsy results with him. - Planned to repeat endoscopy in two years per gastroenterology recommendations.  Gastroesophageal reflux disease Chronic GERD with history of aspiration and pneumonia, most recently four weeks ago. Recent endoscopy performed to evaluate for esophageal pathology. - Reviewed GERD management and recent endoscopy findings. - Provided  anticipatory guidance regarding aspiration risk and GERD management.  Plan - He is clinically stable overall, lab reviewed, PSA still pending,  if is stable, will continue current treatment - Will proceed to Eligard  injection today and continue every 3 months - Follow-up in 3 months   SUMMARY OF ONCOLOGIC HISTORY: Oncology History Overview Note   Cancer Staging  Secondary malignant neoplasm of bone and bone marrow (HCC) Staging form: Bone - Appendicular Skeleton, Trunk, Skull, and Facial Bones, AJCC 8th Edition - Clinical: Stage Unknown (cTX, cNX, pM1) - Signed by Amadeo Windell SAILOR, MD on 12/10/2020     Secondary malignant neoplasm of bone and bone marrow (HCC)  08/06/2018 Initial Diagnosis   Secondary malignant neoplasm of bone and bone marrow (HCC)   12/10/2020 Cancer Staging   Staging form: Bone - Appendicular Skeleton, Trunk, Skull, and Facial Bones, AJCC 8th Edition - Clinical: Stage Unknown (cTX, cNX, pM1) - Signed by Amadeo Windell SAILOR, MD on 12/10/2020   Prostate cancer (HCC)  09/16/2021 Initial Diagnosis   Prostate cancer (HCC)   05/24/2024 Genetic Testing   CHEK2  c.1100delC (p.T367Mfs*15)  VUS in ALK  p.S15Y (c.44C>A)  The CancerNext-Expanded gene panel offered by [employer] and includes sequencing, rearrangement, and RNA analysis for the following 77 genes: AIP, ALK, APC, ATM, BAP1, BARD1, BMPR1A, BRCA1, BRCA2, BRIP1, CDC73, CDH1, CDK4, CDKN1B, CDKN2A, CEBPA, CHEK2, CTNNA1, DDX41, DICER1, ETV6, FH, FLCN, GATA2, LZTR1, MAX, MBD4, MEN1, MET, MLH1, MSH2, MSH3, MSH6, MUTYH, NF1, NF2, NTHL1, PALB2, PHOX2B, PMS2, POT1, PRKAR1A, PTCH1, PTEN, RAD51C, RAD51D, RB1, RET, RPS20, RUNX1, SDHA, SDHAF2, SDHB, SDHC, SDHD, SMAD4, SMARCA4, SMARCB1, SMARCE1, STK11, SUFU, TMEM127, TP53, TSC1, TSC2, VHL, and WT1 (sequencing and deletion/duplication); AXIN2, CTNNA1, DDX41, EGFR, HOXB13, KIT, MBD4, MITF, MSH3, PDGFRA, POLD1 and POLE (sequencing only); EPCAM and GREM1 (deletion/duplication only). RNA  data is routinely analyzed for use in variant interpretation for all genes.      Discussed the use of AI scribe software for clinical note transcription with the patient, who gave verbal consent to proceed.  History of Present Illness [name] Holder is a 69 year old male with metastatic castration-resistant prostate cancer who presents for routine oncology follow-up and disease monitoring.  He was diagnosed with metastatic prostate cancer with pelvic nodal involvement in 2014. Prostatectomy was aborted due to nodal disease, and he has not had radiation or other local therapy to the primary tumor. He has been on abiraterone  since 2018 with good control. PSA was elevated in July 2025 with a slight decrease by September 2025. Recent CT showed a possible prostate lesion. He has no new pain or change in urinary symptoms.  He has chronic back pain that worsens with prolonged standing but has no new or progressive pain. He takes about four oxycodone  tablets daily, mostly in the daytime, and rarely at night. He adjusts his bowel regimen for opioid-induced constipation. Bowel habits alternate between constipation and diarrhea every three to four days, managed with glycerin suppositories and Linaclotide .  Urinary symptoms include a longstanding slow stream and intermittent mild incontinence, which may fluctuate with bowel symptoms. He sometimes uses [employer] for outings for safety. He reports no new urinary difficulties.  He had recent endoscopic evaluation for gastrointestinal symptoms and polyp surveillance with benign biopsies and no malignancy or dysplasia. He also has severe gastroesophageal reflux with aspiration events and pneumonia, most recently about four weeks ago.  Germline testing in October 2025 showed a CHEK2 mutation with moderate cancer risk and no other actionable findings. Family history includes colorectal polyps and colorectal cancer in first-degree relatives.     All other systems  were reviewed with the patient and are  negative.  MEDICAL HISTORY:  Past Medical History:  Diagnosis Date   Allergic rhinitis    At risk for sleep apnea    STOP-BANG SCORE = 5    (routed to pt's pcp 09-08-2018)   Bladder cancer (HCC) dx'd 2020   Chronic constipation    Chronic low back pain    Chronic pain    DOE (dyspnea on exertion)    Family history of prostate cancer    History of kidney stones    History of recent pneumonia 08/10/2018   acute bronchopneumonia;  follow-up cxr 08-28-2018 in epic   History of vertebral compression fracture    T10 and T12 s/p kyphoplasty   Hx of colonic polyps    Hyperplasia of prostate with lower urinary tract symptoms (LUTS)    Hypertension    Mild persistent asthma    followed by pcp   Numbness in both legs    secondary to a fall, per pt has had work-up and couldn't find anything   Osteopenia    Prostate cancer metastatic to pelvis [facility]) urologist-- dr borden/  oncologist -- dr amadeo   dx 2014 advanced prostate cancer with pelvid adenopathy,  Stage T2c, Gleason 7;  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   Wears glasses     SURGICAL HISTORY: Past Surgical History:  Procedure Laterality Date   COLONOSCOPY     IR KYPHO EA ADDL LEVEL THORACIC OR LUMBAR  08/17/2018   IR KYPHO EA ADDL LEVEL THORACIC OR LUMBAR  08/26/2018   IR KYPHO LUMBAR INC FX REDUCE BONE BX UNI/BIL CANNULATION INC/IMAGING  08/26/2018   IR KYPHO THORACIC WITH BONE BIOPSY  08/17/2018   IR RADIOLOGIST EVAL & MGMT  08/11/2018   IR RADIOLOGIST EVAL & MGMT  08/24/2018   LYMPHADENECTOMY Left 01/05/2013   Procedure: ROBOTIC LYMPHADENECTOMY;  Surgeon: Noretta Ferrara, MD;  Location: [facility];  Service: Urology;  Laterality: Left;   PROSTATE BIOPSY     11/12 positive biopsies   ROTATOR CUFF REPAIR Left 2012   TRANSURETHRAL RESECTION OF BLADDER TUMOR N/A 09/15/2018   Procedure: TRANSURETHRAL RESECTION OF BLADDER TUMOR (TURBT)WITH CYSTOSCOPY/ POST OPERATIVE INSTILLATION OF GEMCITABINE ;  Surgeon: Ferrara Glance, MD;  Location: [facility];  Service: Urology;  Laterality: N/A;  GENERAL ANESTHESIA WITH PARALYSIS   UMBILICAL HERNIA REPAIR  09-02-2001   dr t. price @[facility]     I have reviewed the social history and family history with the patient and they are unchanged from previous note.  ALLERGIES:  is allergic to amlodipine  and tetracycline.  MEDICATIONS:  Current Outpatient Medications  Medication Sig Dispense Refill   abiraterone  acetate (ZYTIGA ) 250 MG tablet TAKE 4 TABLETS (1,000 MG TOTAL) BY MOUTH DAILY 120 tablet 0   acetaminophen  (TYLENOL ) 325 MG tablet Take 325 mg by mouth daily as needed for fever.     albuterol  (PROVENTIL ) (2.5 MG/3ML) 0.083% nebulizer solution INHALE THREE ML by NEBULIZER EVERY 6 HOURS AS NEEDED FOR WHEEZING OR SHORTNESS OF BREATH 360 mL 2   albuterol  (VENTOLIN  HFA) 108 (90 Base) MCG/ACT inhaler INHALE 2 PUFFS INTO LUNGS EVERY 6 HOURS AS NEEDED FOR WHEEZING OR SHORTNESS OF BREATH 18 g 1   azelastine  (ASTELIN ) 0.1 % nasal spray Place 1 spray into both nostrils 2 (two) times daily. Use in each nostril as directed 90 mL 3   CALCIUM -VITAMIN D  PO Take 1 tablet by mouth 2 (two) times daily.      carvedilol  (COREG ) 3.125 MG tablet TAKE ONE TABLET TWICE DAILY WITH FOOD 180 tablet  0   denosumab  (PROLIA ) 60 MG/ML SOSY injection Inject 60 mg into the skin every 6 (six) months.     docusate sodium  (COLACE) 50 MG capsule Take 50 mg by mouth daily.     DULoxetine  (CYMBALTA ) 20 MG capsule TAKE ONE CAPSULE DAILY 30 capsule 1   Famotidine  (PEPCID  AC PO) Take 1 tablet by mouth at bedtime.     fluticasone  (FLONASE ) 50 MCG/ACT nasal spray 2 SPRAYS INTO EACH NOSTRIL DAILY 48 g 1   Fluticasone -Umeclidin-Vilant (TRELEGY ELLIPTA ) 100-62.5-25 MCG/ACT AEPB Inhale 1 puff into the lungs daily. 120 each 3   furosemide  (LASIX ) 20 MG tablet TAKE ONE TABLET TWICE DAILY 180 tablet 0   leuprolide  (LUPRON ) 11.25 MG injection Inject 11.25 mg into the muscle every 3 (three) months.     LINZESS  145 MCG CAPS  capsule TAKE 1 CAPSULE BY MOUTH EVERY DAY BEFORE BREAKFAST 30 capsule 2   meclizine  (ANTIVERT ) 12.5 MG tablet Take 1 tablet (12.5 mg total) by mouth 3 (three) times daily as needed for dizziness. 270 tablet 0   Multiple Vitamins-Minerals (CENTRUM SILVER 50+MEN PO) Take 1 tablet by mouth daily.     NARCAN  4 MG/0.1ML LIQD nasal spray kit Place 0.1 sprays (0.4 mg total) into the nose once as needed (overdose). 1 each 0   olmesartan  (BENICAR ) 20 MG tablet TAKE ONE TABLET TWICE DAILY (ACTUALLY TAKING 1/2 (10MG ) TWICE A DAY) 180 tablet 0   polyethylene glycol powder (GLYCOLAX /MIRALAX ) 17 GM/SCOOP powder Take 17 g (1 capful) by mouth daily as directed. 510 g 6   predniSONE  (DELTASONE ) 5 MG tablet Take 1 tablet (5 mg total) by mouth 2 (two) times daily. Ok to increase to 10mg  bid when he has infection or other acute illness 100 tablet 3   rosuvastatin  (CRESTOR ) 10 MG tablet Take 1 tablet (10 mg total) by mouth daily. 90 tablet 0   solifenacin (VESICARE) 5 MG tablet Take 5 mg by mouth daily.     triamcinolone  cream (KENALOG ) 0.1 % Apply 1 Application topically 2 (two) times daily. 15 g 0   oxyCODONE  (OXY IR/ROXICODONE ) 5 MG immediate release tablet Take 1 tablet (5 mg) by mouth every 4 hours as needed for severe pain. 180 tablet 0   No current facility-administered medications for this visit.    PHYSICAL EXAMINATION: ECOG PERFORMANCE STATUS: 2 - Symptomatic, <50% confined to bed  Vitals:   08/08/24 1302  BP: 134/80  Pulse: 78  Resp: 15  Temp: 97.9 F (36.6 C)  SpO2: 98%   Wt Readings from Last 3 Encounters:  08/08/24 227 lb (103 kg)  08/01/24 223 lb 12.8 oz (101.5 kg)  07/19/24 220 lb (99.8 kg)     GENERAL:alert, no distress and comfortable SKIN: skin color, texture, turgor are normal, no rashes or significant lesions EYES: normal, Conjunctiva are pink and non-injected, sclera clear NECK: supple, thyroid  normal size, non-tender, without nodularity LYMPH:  no palpable lymphadenopathy in  the cervical, axillary  LUNGS: clear to auscultation and percussion with normal breathing effort HEART: regular rate & rhythm and no murmurs and no lower extremity edema ABDOMEN:abdomen soft, non-tender and normal bowel sounds Musculoskeletal:no cyanosis of digits and no clubbing  NEURO: alert & oriented x 3 with fluent speech, no focal motor/sensory deficits  Physical Exam    LABORATORY DATA:  I have reviewed the data as listed    Latest Ref Rng & Units 08/08/2024   12:33 PM 05/03/2024   12:38 PM 04/11/2024   11:37 AM  CBC  WBC 4.0 - 10.5 K/uL 10.1  9.0  10.9   Hemoglobin 13.0 - 17.0 g/dL 87.8  87.4  87.0   Hematocrit 39.0 - 52.0 % 35.2  35.1  37.1   Platelets 150 - 400 K/uL 255  252  283         Latest Ref Rng & Units 08/08/2024   12:33 PM 08/01/2024    3:38 PM 05/03/2024   12:38 PM  CMP  Glucose 70 - 99 mg/dL 893  897  891   BUN 8 - 23 mg/dL 9  10  8    Creatinine 0.61 - 1.24 mg/dL 9.16  9.19  9.29   Sodium 135 - 145 mmol/L 132  134  132   Potassium 3.5 - 5.1 mmol/L 4.6  4.0  4.1   Chloride 98 - 111 mmol/L 97  100  103   CO2 22 - 32 mmol/L 24  24  24    Calcium  8.9 - 10.3 mg/dL 9.5  9.1  9.3   Total Protein 6.5 - 8.1 g/dL 6.3   6.6   Total Bilirubin 0.0 - 1.2 mg/dL 0.3   0.4   Alkaline Phos 38 - 126 U/L 38   39   AST 15 - 41 U/L 19   13   ALT 0 - 44 U/L 22   16       RADIOGRAPHIC STUDIES: I have personally reviewed the radiological images as listed and agreed with the findings in the report. No results found.    No orders of the defined types were placed in this encounter.  All questions were answered. The patient knows to call the clinic with any problems, questions or concerns. No barriers to learning was detected. The total time spent in the appointment was 30 minutes, including review of chart and various tests results, discussions about plan of care and coordination of care plan     Onita Mattock, MD 08/08/2024     "

## 2024-08-09 ENCOUNTER — Encounter: Payer: Self-pay | Admitting: Hematology

## 2024-08-10 ENCOUNTER — Other Ambulatory Visit: Payer: Self-pay | Admitting: Internal Medicine

## 2024-08-10 DIAGNOSIS — E785 Hyperlipidemia, unspecified: Secondary | ICD-10-CM

## 2024-08-11 NOTE — Progress Notes (Signed)
 Received call from [facility] stating that the pt's Prolia  has been approved effective 08/11/2024 thru 08/11/2025.  Authorization# [id number]

## 2024-08-16 ENCOUNTER — Telehealth: Payer: Self-pay | Admitting: Pharmacy Technician

## 2024-08-16 ENCOUNTER — Other Ambulatory Visit (HOSPITAL_COMMUNITY): Payer: Self-pay

## 2024-08-16 NOTE — Telephone Encounter (Signed)
 Pharmacy Patient Advocate Encounter   Received notification from [employer] CMM KEY that prior authorization for Olmesartan  Medoxomil 20MG  tablets is required/requested.   Insurance verification completed.   The patient is insured through [employer]   Per test claim: Max daily dose of 1 per day.      Can the order be resent for 20mg  tablets- Take 1/2 tablet twice daily #90 to allow for insurance coverage?  CMM Key# [id number]

## 2024-08-17 ENCOUNTER — Other Ambulatory Visit: Payer: Self-pay | Admitting: Nurse Practitioner

## 2024-08-18 ENCOUNTER — Other Ambulatory Visit: Payer: Self-pay | Admitting: Internal Medicine

## 2024-08-18 ENCOUNTER — Encounter: Payer: Self-pay | Admitting: Hematology

## 2024-08-18 DIAGNOSIS — I10 Essential (primary) hypertension: Secondary | ICD-10-CM

## 2024-08-18 MED ORDER — OLMESARTAN MEDOXOMIL 20 MG PO TABS: 10.0000 mg | ORAL_TABLET | Freq: Two times a day (BID) | ORAL | 1 refills | Status: AC

## 2024-08-18 NOTE — Telephone Encounter (Signed)
 Can you please change the prescription for insurance to change it

## 2024-08-22 ENCOUNTER — Other Ambulatory Visit: Payer: Self-pay | Admitting: *Deleted

## 2024-08-23 ENCOUNTER — Telehealth (HOSPITAL_COMMUNITY): Payer: Self-pay | Admitting: Internal Medicine

## 2024-08-23 NOTE — Telephone Encounter (Signed)
 We have attempted to contact the ordering providers office to obtain a prior authorization for the ordered test.  However, we have been unsuccesful. Order will be removed from the active order WQ. Once the prior authorization is obtained we will reinstate the order and schedule patient.    Order cancelled and sent MD message. 08/16/24 Final attempt to obtain PA# x 3  08/09/24 Inbasket sent x 2 for PA#  08/02/24 Inbasket sent for PA#      Thank you

## 2024-08-24 ENCOUNTER — Other Ambulatory Visit: Payer: Self-pay | Admitting: Internal Medicine

## 2024-08-24 DIAGNOSIS — J4521 Mild intermittent asthma with (acute) exacerbation: Secondary | ICD-10-CM

## 2024-08-31 ENCOUNTER — Other Ambulatory Visit: Payer: Self-pay | Admitting: Hematology

## 2024-08-31 DIAGNOSIS — Z8546 Personal history of malignant neoplasm of prostate: Secondary | ICD-10-CM

## 2024-09-01 ENCOUNTER — Other Ambulatory Visit: Payer: Self-pay | Admitting: Primary Care

## 2024-09-01 DIAGNOSIS — J4520 Mild intermittent asthma, uncomplicated: Secondary | ICD-10-CM

## 2024-10-09 ENCOUNTER — Encounter: Admitting: Pulmonary Disease

## 2024-10-31 ENCOUNTER — Inpatient Hospital Stay

## 2024-10-31 ENCOUNTER — Inpatient Hospital Stay: Admitting: Hematology

## 2024-10-31 ENCOUNTER — Inpatient Hospital Stay: Attending: Hematology

## 2024-11-30 ENCOUNTER — Ambulatory Visit: Admitting: Internal Medicine

## 2025-07-31 ENCOUNTER — Encounter: Admitting: Internal Medicine

## 2025-07-31 ENCOUNTER — Ambulatory Visit
